# Patient Record
Sex: Male | Born: 1935 | Race: Black or African American | Hispanic: No | Smoking: Former smoker
Health system: Southern US, Community
[De-identification: ages and names within clinical notes are randomized; demographics above are authoritative.]

## PROBLEM LIST (undated history)

## (undated) DIAGNOSIS — J189 Pneumonia, unspecified organism: Secondary | ICD-10-CM

## (undated) DIAGNOSIS — M549 Dorsalgia, unspecified: Secondary | ICD-10-CM

## (undated) DIAGNOSIS — H409 Unspecified glaucoma: Secondary | ICD-10-CM

## (undated) DIAGNOSIS — F329 Major depressive disorder, single episode, unspecified: Secondary | ICD-10-CM

## (undated) DIAGNOSIS — K219 Gastro-esophageal reflux disease without esophagitis: Secondary | ICD-10-CM

## (undated) DIAGNOSIS — G8929 Other chronic pain: Secondary | ICD-10-CM

## (undated) DIAGNOSIS — I2510 Atherosclerotic heart disease of native coronary artery without angina pectoris: Secondary | ICD-10-CM

## (undated) DIAGNOSIS — E039 Hypothyroidism, unspecified: Secondary | ICD-10-CM

## (undated) DIAGNOSIS — K449 Diaphragmatic hernia without obstruction or gangrene: Secondary | ICD-10-CM

## (undated) DIAGNOSIS — N289 Disorder of kidney and ureter, unspecified: Secondary | ICD-10-CM

## (undated) DIAGNOSIS — T8859XA Other complications of anesthesia, initial encounter: Secondary | ICD-10-CM

## (undated) DIAGNOSIS — I2699 Other pulmonary embolism without acute cor pulmonale: Secondary | ICD-10-CM

## (undated) DIAGNOSIS — K222 Esophageal obstruction: Secondary | ICD-10-CM

## (undated) DIAGNOSIS — I480 Paroxysmal atrial fibrillation: Secondary | ICD-10-CM

## (undated) DIAGNOSIS — I219 Acute myocardial infarction, unspecified: Secondary | ICD-10-CM

## (undated) DIAGNOSIS — F32A Depression, unspecified: Secondary | ICD-10-CM

## (undated) DIAGNOSIS — R112 Nausea with vomiting, unspecified: Secondary | ICD-10-CM

## (undated) DIAGNOSIS — T4145XA Adverse effect of unspecified anesthetic, initial encounter: Secondary | ICD-10-CM

## (undated) DIAGNOSIS — H9190 Unspecified hearing loss, unspecified ear: Secondary | ICD-10-CM

## (undated) DIAGNOSIS — E785 Hyperlipidemia, unspecified: Secondary | ICD-10-CM

## (undated) DIAGNOSIS — I209 Angina pectoris, unspecified: Secondary | ICD-10-CM

## (undated) DIAGNOSIS — K649 Unspecified hemorrhoids: Secondary | ICD-10-CM

## (undated) DIAGNOSIS — I639 Cerebral infarction, unspecified: Secondary | ICD-10-CM

## (undated) DIAGNOSIS — F419 Anxiety disorder, unspecified: Secondary | ICD-10-CM

## (undated) DIAGNOSIS — M1990 Unspecified osteoarthritis, unspecified site: Secondary | ICD-10-CM

## (undated) DIAGNOSIS — J45909 Unspecified asthma, uncomplicated: Secondary | ICD-10-CM

## (undated) DIAGNOSIS — Z9889 Other specified postprocedural states: Secondary | ICD-10-CM

## (undated) DIAGNOSIS — I82409 Acute embolism and thrombosis of unspecified deep veins of unspecified lower extremity: Secondary | ICD-10-CM

## (undated) DIAGNOSIS — J9819 Other pulmonary collapse: Secondary | ICD-10-CM

## (undated) DIAGNOSIS — R109 Unspecified abdominal pain: Secondary | ICD-10-CM

## (undated) DIAGNOSIS — I10 Essential (primary) hypertension: Secondary | ICD-10-CM

## (undated) HISTORY — DX: Unspecified hemorrhoids: K64.9

## (undated) HISTORY — PX: HERNIA REPAIR: SHX51

## (undated) HISTORY — DX: Esophageal obstruction: K22.2

## (undated) HISTORY — DX: Diaphragmatic hernia without obstruction or gangrene: K44.9

## (undated) HISTORY — PX: CORONARY ANGIOPLASTY WITH STENT PLACEMENT: SHX49

## (undated) HISTORY — PX: CARDIAC CATHETERIZATION: SHX172

---
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
## 1968-09-22 DIAGNOSIS — J189 Pneumonia, unspecified organism: Secondary | ICD-10-CM

## 1968-09-22 HISTORY — DX: Pneumonia, unspecified organism: J18.9

## 1978-09-23 DIAGNOSIS — I82409 Acute embolism and thrombosis of unspecified deep veins of unspecified lower extremity: Secondary | ICD-10-CM

## 1978-09-23 HISTORY — DX: Acute embolism and thrombosis of unspecified deep veins of unspecified lower extremity: I82.409

## 1988-09-22 HISTORY — PX: CATARACT EXTRACTION W/ INTRAOCULAR LENS  IMPLANT, BILATERAL: SHX1307

## 1997-11-17 ENCOUNTER — Ambulatory Visit (HOSPITAL_COMMUNITY): Admission: RE | Admit: 1997-11-17 | Discharge: 1997-11-17 | Payer: Self-pay | Admitting: Family Medicine

## 1997-11-17 ENCOUNTER — Encounter: Payer: Self-pay | Admitting: Family Medicine

## 1997-11-19 ENCOUNTER — Observation Stay (HOSPITAL_COMMUNITY): Admission: AD | Admit: 1997-11-19 | Discharge: 1997-11-20 | Payer: Self-pay | Admitting: Cardiology

## 1998-01-28 ENCOUNTER — Encounter: Payer: Self-pay | Admitting: Cardiology

## 1998-01-28 ENCOUNTER — Ambulatory Visit (HOSPITAL_COMMUNITY): Admission: RE | Admit: 1998-01-28 | Discharge: 1998-01-28 | Payer: Self-pay | Admitting: Cardiology

## 1998-02-03 ENCOUNTER — Observation Stay (HOSPITAL_COMMUNITY): Admission: AD | Admit: 1998-02-03 | Discharge: 1998-02-04 | Payer: Self-pay | Admitting: Cardiology

## 1998-05-24 ENCOUNTER — Ambulatory Visit (HOSPITAL_COMMUNITY): Admission: RE | Admit: 1998-05-24 | Discharge: 1998-05-24 | Payer: Self-pay | Admitting: Gastroenterology

## 1999-08-29 ENCOUNTER — Encounter: Payer: Self-pay | Admitting: Urology

## 1999-08-29 ENCOUNTER — Encounter: Admission: RE | Admit: 1999-08-29 | Discharge: 1999-08-29 | Payer: Self-pay | Admitting: Urology

## 2000-02-20 ENCOUNTER — Ambulatory Visit (HOSPITAL_COMMUNITY): Admission: RE | Admit: 2000-02-20 | Discharge: 2000-02-20 | Payer: Self-pay | Admitting: Cardiology

## 2000-05-10 ENCOUNTER — Emergency Department (HOSPITAL_COMMUNITY): Admission: EM | Admit: 2000-05-10 | Discharge: 2000-05-10 | Payer: Self-pay | Admitting: *Deleted

## 2000-07-15 ENCOUNTER — Ambulatory Visit (HOSPITAL_COMMUNITY): Admission: RE | Admit: 2000-07-15 | Discharge: 2000-07-15 | Payer: Self-pay | Admitting: Cardiology

## 2000-07-15 ENCOUNTER — Encounter: Payer: Self-pay | Admitting: Cardiology

## 2000-12-24 ENCOUNTER — Inpatient Hospital Stay (HOSPITAL_COMMUNITY): Admission: EM | Admit: 2000-12-24 | Discharge: 2000-12-27 | Payer: Self-pay

## 2000-12-26 ENCOUNTER — Encounter: Payer: Self-pay | Admitting: Cardiology

## 2001-03-07 ENCOUNTER — Encounter: Payer: Self-pay | Admitting: Family Medicine

## 2001-03-07 ENCOUNTER — Ambulatory Visit (HOSPITAL_COMMUNITY): Admission: RE | Admit: 2001-03-07 | Discharge: 2001-03-07 | Payer: Self-pay | Admitting: Family Medicine

## 2001-12-13 ENCOUNTER — Emergency Department (HOSPITAL_COMMUNITY): Admission: EM | Admit: 2001-12-13 | Discharge: 2001-12-13 | Payer: Self-pay | Admitting: Emergency Medicine

## 2001-12-13 ENCOUNTER — Encounter: Payer: Self-pay | Admitting: Emergency Medicine

## 2002-06-15 ENCOUNTER — Ambulatory Visit (HOSPITAL_COMMUNITY): Admission: RE | Admit: 2002-06-15 | Discharge: 2002-06-15 | Payer: Self-pay | Admitting: Cardiology

## 2002-06-15 ENCOUNTER — Encounter: Payer: Self-pay | Admitting: Cardiology

## 2002-12-24 ENCOUNTER — Ambulatory Visit (HOSPITAL_COMMUNITY): Admission: RE | Admit: 2002-12-24 | Discharge: 2002-12-25 | Payer: Self-pay | Admitting: Cardiology

## 2003-06-28 ENCOUNTER — Ambulatory Visit (HOSPITAL_COMMUNITY): Admission: RE | Admit: 2003-06-28 | Discharge: 2003-06-28 | Payer: Self-pay | Admitting: Cardiology

## 2003-09-04 ENCOUNTER — Emergency Department (HOSPITAL_COMMUNITY): Admission: EM | Admit: 2003-09-04 | Discharge: 2003-09-05 | Payer: Self-pay | Admitting: Emergency Medicine

## 2003-10-25 ENCOUNTER — Ambulatory Visit: Payer: Self-pay | Admitting: Family Medicine

## 2003-10-29 ENCOUNTER — Ambulatory Visit: Payer: Self-pay | Admitting: Sports Medicine

## 2003-11-11 ENCOUNTER — Ambulatory Visit: Payer: Self-pay | Admitting: Family Medicine

## 2003-11-26 ENCOUNTER — Ambulatory Visit: Payer: Self-pay | Admitting: Family Medicine

## 2004-05-01 ENCOUNTER — Emergency Department (HOSPITAL_COMMUNITY): Admission: EM | Admit: 2004-05-01 | Discharge: 2004-05-01 | Payer: Self-pay | Admitting: Emergency Medicine

## 2004-05-31 ENCOUNTER — Encounter: Admission: RE | Admit: 2004-05-31 | Discharge: 2004-05-31 | Payer: Self-pay | Admitting: Allergy and Immunology

## 2004-11-17 ENCOUNTER — Inpatient Hospital Stay (HOSPITAL_COMMUNITY): Admission: EM | Admit: 2004-11-17 | Discharge: 2004-11-21 | Payer: Self-pay | Admitting: Emergency Medicine

## 2004-12-02 ENCOUNTER — Emergency Department (HOSPITAL_COMMUNITY): Admission: EM | Admit: 2004-12-02 | Discharge: 2004-12-02 | Payer: Self-pay | Admitting: Emergency Medicine

## 2005-05-21 ENCOUNTER — Ambulatory Visit (HOSPITAL_COMMUNITY): Admission: RE | Admit: 2005-05-21 | Discharge: 2005-05-21 | Payer: Self-pay | Admitting: Cardiology

## 2005-08-12 ENCOUNTER — Emergency Department (HOSPITAL_COMMUNITY): Admission: EM | Admit: 2005-08-12 | Discharge: 2005-08-12 | Payer: Self-pay | Admitting: Emergency Medicine

## 2005-11-08 ENCOUNTER — Emergency Department (HOSPITAL_COMMUNITY): Admission: EM | Admit: 2005-11-08 | Discharge: 2005-11-08 | Payer: Self-pay | Admitting: Emergency Medicine

## 2005-12-19 ENCOUNTER — Emergency Department (HOSPITAL_COMMUNITY): Admission: EM | Admit: 2005-12-19 | Discharge: 2005-12-19 | Payer: Self-pay | Admitting: Emergency Medicine

## 2006-03-21 DIAGNOSIS — E1165 Type 2 diabetes mellitus with hyperglycemia: Secondary | ICD-10-CM

## 2006-03-21 DIAGNOSIS — E1129 Type 2 diabetes mellitus with other diabetic kidney complication: Secondary | ICD-10-CM | POA: Insufficient documentation

## 2006-03-21 DIAGNOSIS — J309 Allergic rhinitis, unspecified: Secondary | ICD-10-CM | POA: Insufficient documentation

## 2006-03-21 DIAGNOSIS — I10 Essential (primary) hypertension: Secondary | ICD-10-CM

## 2006-03-21 DIAGNOSIS — I739 Peripheral vascular disease, unspecified: Secondary | ICD-10-CM | POA: Insufficient documentation

## 2006-07-08 ENCOUNTER — Ambulatory Visit (HOSPITAL_COMMUNITY): Admission: RE | Admit: 2006-07-08 | Discharge: 2006-07-08 | Payer: Self-pay | Admitting: Cardiology

## 2007-07-18 ENCOUNTER — Emergency Department (HOSPITAL_COMMUNITY): Admission: EM | Admit: 2007-07-18 | Discharge: 2007-07-18 | Payer: Self-pay | Admitting: Emergency Medicine

## 2008-01-23 DIAGNOSIS — I2699 Other pulmonary embolism without acute cor pulmonale: Secondary | ICD-10-CM

## 2008-01-23 HISTORY — DX: Other pulmonary embolism without acute cor pulmonale: I26.99

## 2008-02-29 ENCOUNTER — Inpatient Hospital Stay (HOSPITAL_COMMUNITY): Admission: EM | Admit: 2008-02-29 | Discharge: 2008-03-08 | Payer: Self-pay | Admitting: Emergency Medicine

## 2008-03-01 ENCOUNTER — Encounter (INDEPENDENT_AMBULATORY_CARE_PROVIDER_SITE_OTHER): Payer: Self-pay | Admitting: Cardiovascular Disease

## 2008-03-16 ENCOUNTER — Inpatient Hospital Stay (HOSPITAL_COMMUNITY): Admission: EM | Admit: 2008-03-16 | Discharge: 2008-03-23 | Payer: Self-pay | Admitting: Emergency Medicine

## 2008-03-16 ENCOUNTER — Encounter (INDEPENDENT_AMBULATORY_CARE_PROVIDER_SITE_OTHER): Payer: Self-pay | Admitting: Emergency Medicine

## 2008-03-17 ENCOUNTER — Ambulatory Visit: Payer: Self-pay | Admitting: Vascular Surgery

## 2008-04-01 ENCOUNTER — Encounter (HOSPITAL_COMMUNITY): Admission: RE | Admit: 2008-04-01 | Discharge: 2008-06-30 | Payer: Self-pay | Admitting: Cardiology

## 2008-08-28 ENCOUNTER — Emergency Department (HOSPITAL_COMMUNITY): Admission: EM | Admit: 2008-08-28 | Discharge: 2008-08-29 | Payer: Self-pay | Admitting: Emergency Medicine

## 2008-09-25 ENCOUNTER — Emergency Department (HOSPITAL_COMMUNITY): Admission: EM | Admit: 2008-09-25 | Discharge: 2008-09-25 | Payer: Self-pay | Admitting: Family Medicine

## 2008-10-15 ENCOUNTER — Observation Stay (HOSPITAL_COMMUNITY): Admission: EM | Admit: 2008-10-15 | Discharge: 2008-10-17 | Payer: Self-pay | Admitting: Emergency Medicine

## 2009-02-11 ENCOUNTER — Emergency Department (HOSPITAL_COMMUNITY): Admission: EM | Admit: 2009-02-11 | Discharge: 2009-02-11 | Payer: Self-pay | Admitting: Emergency Medicine

## 2009-06-27 ENCOUNTER — Inpatient Hospital Stay (HOSPITAL_COMMUNITY): Admission: EM | Admit: 2009-06-27 | Discharge: 2009-07-11 | Payer: Self-pay | Admitting: Emergency Medicine

## 2009-07-01 ENCOUNTER — Encounter (INDEPENDENT_AMBULATORY_CARE_PROVIDER_SITE_OTHER): Payer: Self-pay | Admitting: Cardiology

## 2009-07-12 LAB — HM COLONOSCOPY

## 2010-04-09 LAB — BASIC METABOLIC PANEL
BUN: 15 mg/dL (ref 6–23)
CO2: 26 mEq/L (ref 19–32)
CO2: 26 mEq/L (ref 19–32)
CO2: 26 mEq/L (ref 19–32)
Chloride: 107 mEq/L (ref 96–112)
GFR calc Af Amer: 50 mL/min — ABNORMAL LOW (ref >60)
GFR calc non Af Amer: 41 mL/min — ABNORMAL LOW (ref >60)
Glucose, Bld: 103 mg/dL — ABNORMAL HIGH (ref 70–99)
Glucose, Bld: 95 mg/dL (ref 70–99)
Potassium: 3.7 mEq/L (ref 3.5–5.1)
Potassium: 3.9 mEq/L (ref 3.5–5.1)
Potassium: 4 mEq/L (ref 3.5–5.1)
Sodium: 138 mEq/L (ref 135–145)
Sodium: 140 mEq/L (ref 135–145)

## 2010-04-09 LAB — GLUCOSE, CAPILLARY
Glucose-Capillary: 106 mg/dL — ABNORMAL HIGH (ref 70–99)
Glucose-Capillary: 108 mg/dL — ABNORMAL HIGH (ref 70–99)
Glucose-Capillary: 110 mg/dL — ABNORMAL HIGH (ref 70–99)
Glucose-Capillary: 110 mg/dL — ABNORMAL HIGH (ref 70–99)
Glucose-Capillary: 113 mg/dL — ABNORMAL HIGH (ref 70–99)
Glucose-Capillary: 117 mg/dL — ABNORMAL HIGH (ref 70–99)
Glucose-Capillary: 121 mg/dL — ABNORMAL HIGH (ref 70–99)
Glucose-Capillary: 128 mg/dL — ABNORMAL HIGH (ref 70–99)
Glucose-Capillary: 135 mg/dL — ABNORMAL HIGH (ref 70–99)
Glucose-Capillary: 140 mg/dL — ABNORMAL HIGH (ref 70–99)
Glucose-Capillary: 147 mg/dL — ABNORMAL HIGH (ref 70–99)
Glucose-Capillary: 154 mg/dL — ABNORMAL HIGH (ref 70–99)
Glucose-Capillary: 92 mg/dL (ref 70–99)

## 2010-04-09 LAB — CBC
HCT: 28.9 % — ABNORMAL LOW (ref 39.0–52.0)
HCT: 30.4 % — ABNORMAL LOW (ref 39.0–52.0)
HCT: 31.1 % — ABNORMAL LOW (ref 39.0–52.0)
HCT: 32.1 % — ABNORMAL LOW (ref 39.0–52.0)
Hemoglobin: 10.2 g/dL — ABNORMAL LOW (ref 13.0–17.0)
Hemoglobin: 10.3 g/dL — ABNORMAL LOW (ref 13.0–17.0)
Hemoglobin: 9.5 g/dL — ABNORMAL LOW (ref 13.0–17.0)
MCHC: 32.6 g/dL (ref 30.0–36.0)
MCHC: 32.8 g/dL (ref 30.0–36.0)
MCHC: 33.1 g/dL (ref 30.0–36.0)
MCHC: 33.4 g/dL (ref 30.0–36.0)
MCHC: 33.8 g/dL (ref 30.0–36.0)
MCV: 87.8 fL (ref 78.0–100.0)
MCV: 88.1 fL (ref 78.0–100.0)
MCV: 88.2 fL (ref 78.0–100.0)
Platelets: 118 10*3/uL — ABNORMAL LOW (ref 150–400)
Platelets: 114 10*3/uL — ABNORMAL LOW (ref 150–400)
Platelets: 129 10*3/uL — ABNORMAL LOW (ref 150–400)
RBC: 3.44 MIL/uL — ABNORMAL LOW (ref 4.22–5.81)
RBC: 3.45 MIL/uL — ABNORMAL LOW (ref 4.22–5.81)
RDW: 16.7 % — ABNORMAL HIGH (ref 11.5–15.5)
RDW: 16.4 % — ABNORMAL HIGH (ref 11.5–15.5)
RDW: 16.6 % — ABNORMAL HIGH (ref 11.5–15.5)
RDW: 16.7 % — ABNORMAL HIGH (ref 11.5–15.5)
WBC: 8.9 10*3/uL (ref 4.0–10.5)
WBC: 7.7 10*3/uL (ref 4.0–10.5)

## 2010-04-09 LAB — POCT CARDIAC MARKERS
CKMB, poc: 1.1 ng/mL (ref 1.0–8.0)
Myoglobin, poc: 131 ng/mL (ref 12–200)
Troponin i, poc: <0.05 ng/mL (ref 0.00–0.09)
Troponin i, poc: <0.05 ng/mL (ref 0.00–0.09)

## 2010-04-09 LAB — APTT: aPTT: 33 seconds (ref 24–37)

## 2010-04-09 LAB — POCT I-STAT, CHEM 8
Calcium, Ion: 1.19 mmol/L (ref 1.12–1.32)
Glucose, Bld: 118 mg/dL — ABNORMAL HIGH (ref 70–99)
HCT: 36 % — ABNORMAL LOW (ref 39.0–52.0)
Hemoglobin: 12.2 g/dL — ABNORMAL LOW (ref 13.0–17.0)

## 2010-04-09 LAB — PROTIME-INR
INR: 2.03 — ABNORMAL HIGH (ref 0.00–1.49)
INR: 1.49 (ref 0.00–1.49)
INR: 1.89 — ABNORMAL HIGH (ref 0.00–1.49)
INR: 2.3 — ABNORMAL HIGH (ref 0.00–1.49)
Prothrombin Time: 22.8 seconds — ABNORMAL HIGH (ref 11.6–15.2)
Prothrombin Time: 17.9 seconds — ABNORMAL HIGH (ref 11.6–15.2)
Prothrombin Time: 21.5 seconds — ABNORMAL HIGH (ref 11.6–15.2)
Prothrombin Time: 23.7 seconds — ABNORMAL HIGH (ref 11.6–15.2)
Prothrombin Time: 25.1 seconds — ABNORMAL HIGH (ref 11.6–15.2)

## 2010-04-09 LAB — DIFFERENTIAL
Basophils Absolute: 0 10*3/uL (ref 0.0–0.1)
Lymphocytes Relative: 25 % (ref 12–46)
Neutro Abs: 5.4 10*3/uL (ref 1.7–7.7)
Neutrophils Relative %: 61 % (ref 43–77)

## 2010-04-09 LAB — MAGNESIUM: Magnesium: 1.7 mg/dL (ref 1.5–2.5)

## 2010-04-09 LAB — HEPARIN LEVEL (UNFRACTIONATED): Heparin Unfractionated: 0.39 IU/mL (ref 0.30–0.70)

## 2010-04-10 LAB — PROTIME-INR
INR: 1.19 (ref 0.00–1.49)
INR: 1.26 (ref 0.00–1.49)
INR: 1.45 (ref 0.00–1.49)
INR: 2.3 — ABNORMAL HIGH (ref 0.00–1.49)
INR: 2.43 — ABNORMAL HIGH (ref 0.00–1.49)
Prothrombin Time: 15 seconds (ref 11.6–15.2)
Prothrombin Time: 15.1 seconds (ref 11.6–15.2)
Prothrombin Time: 15.7 seconds — ABNORMAL HIGH (ref 11.6–15.2)

## 2010-04-10 LAB — BASIC METABOLIC PANEL
BUN: 15 mg/dL (ref 6–23)
BUN: 28 mg/dL — ABNORMAL HIGH (ref 6–23)
BUN: 59 mg/dL — ABNORMAL HIGH (ref 6–23)
CO2: 23 mEq/L (ref 19–32)
CO2: 24 mEq/L (ref 19–32)
Calcium: 8.8 mg/dL (ref 8.4–10.5)
Calcium: 9.1 mg/dL (ref 8.4–10.5)
Chloride: 109 mEq/L (ref 96–112)
Chloride: 109 mEq/L (ref 96–112)
Chloride: 113 mEq/L — ABNORMAL HIGH (ref 96–112)
Chloride: 113 mEq/L — ABNORMAL HIGH (ref 96–112)
Creatinine, Ser: 1.47 mg/dL (ref 0.4–1.5)
Creatinine, Ser: 1.75 mg/dL — ABNORMAL HIGH (ref 0.4–1.5)
GFR calc Af Amer: 47 mL/min — ABNORMAL LOW (ref >60)
GFR calc non Af Amer: 23 mL/min — ABNORMAL LOW (ref >60)
GFR calc non Af Amer: 43 mL/min — ABNORMAL LOW (ref >60)
GFR calc non Af Amer: 46 mL/min — ABNORMAL LOW (ref >60)
Glucose, Bld: 94 mg/dL (ref 70–99)
Glucose, Bld: 95 mg/dL (ref 70–99)
Glucose, Bld: 97 mg/dL (ref 70–99)
Glucose, Bld: 98 mg/dL (ref 70–99)
Potassium: 3.8 mEq/L (ref 3.5–5.1)
Potassium: 4 mEq/L (ref 3.5–5.1)
Potassium: 4.7 mEq/L (ref 3.5–5.1)
Sodium: 138 mEq/L (ref 135–145)
Sodium: 138 mEq/L (ref 135–145)
Sodium: 140 mEq/L (ref 135–145)

## 2010-04-10 LAB — GLUCOSE, CAPILLARY
Glucose-Capillary: 101 mg/dL — ABNORMAL HIGH (ref 70–99)
Glucose-Capillary: 109 mg/dL — ABNORMAL HIGH (ref 70–99)
Glucose-Capillary: 116 mg/dL — ABNORMAL HIGH (ref 70–99)
Glucose-Capillary: 122 mg/dL — ABNORMAL HIGH (ref 70–99)
Glucose-Capillary: 127 mg/dL — ABNORMAL HIGH (ref 70–99)
Glucose-Capillary: 128 mg/dL — ABNORMAL HIGH (ref 70–99)
Glucose-Capillary: 131 mg/dL — ABNORMAL HIGH (ref 70–99)
Glucose-Capillary: 131 mg/dL — ABNORMAL HIGH (ref 70–99)
Glucose-Capillary: 134 mg/dL — ABNORMAL HIGH (ref 70–99)
Glucose-Capillary: 142 mg/dL — ABNORMAL HIGH (ref 70–99)
Glucose-Capillary: 157 mg/dL — ABNORMAL HIGH (ref 70–99)
Glucose-Capillary: 82 mg/dL (ref 70–99)
Glucose-Capillary: 93 mg/dL (ref 70–99)
Glucose-Capillary: 125 mg/dL — ABNORMAL HIGH (ref 70–99)
Glucose-Capillary: 135 mg/dL — ABNORMAL HIGH (ref 70–99)

## 2010-04-10 LAB — LIPID PANEL
LDL Cholesterol: 54 mg/dL (ref 0–99)
Total CHOL/HDL Ratio: 2.9 RATIO
Triglycerides: 104 mg/dL (ref <150)
VLDL: 21 mg/dL (ref 0–40)

## 2010-04-10 LAB — CLOSTRIDIUM DIFFICILE EIA: C difficile Toxins A+B, EIA: NEGATIVE

## 2010-04-10 LAB — LIPASE, BLOOD
Lipase: 40 U/L (ref 11–59)
Lipase: 53 U/L (ref 11–59)
Lipase: 63 U/L — ABNORMAL HIGH (ref 11–59)

## 2010-04-10 LAB — URINALYSIS, ROUTINE W REFLEX MICROSCOPIC
Ketones, ur: NEGATIVE mg/dL
Nitrite: NEGATIVE
Protein, ur: NEGATIVE mg/dL
Urobilinogen, UA: 0.2 mg/dL (ref 0.0–1.0)

## 2010-04-10 LAB — CBC
HCT: 33.5 % — ABNORMAL LOW (ref 39.0–52.0)
Hemoglobin: 10 g/dL — ABNORMAL LOW (ref 13.0–17.0)
Hemoglobin: 10.9 g/dL — ABNORMAL LOW (ref 13.0–17.0)
Hemoglobin: 11.5 g/dL — ABNORMAL LOW (ref 13.0–17.0)
Hemoglobin: 11.6 g/dL — ABNORMAL LOW (ref 13.0–17.0)
MCHC: 32.5 g/dL (ref 30.0–36.0)
MCHC: 32.6 g/dL (ref 30.0–36.0)
MCHC: 32.7 g/dL (ref 30.0–36.0)
MCHC: 32.8 g/dL (ref 30.0–36.0)
MCHC: 33 g/dL (ref 30.0–36.0)
MCV: 87.8 fL (ref 78.0–100.0)
Platelets: 116 10*3/uL — ABNORMAL LOW (ref 150–400)
Platelets: 117 10*3/uL — ABNORMAL LOW (ref 150–400)
Platelets: 128 10*3/uL — ABNORMAL LOW (ref 150–400)
RBC: 4.05 MIL/uL — ABNORMAL LOW (ref 4.22–5.81)
RDW: 16 % — ABNORMAL HIGH (ref 11.5–15.5)
RDW: 16.1 % — ABNORMAL HIGH (ref 11.5–15.5)
RDW: 16.2 % — ABNORMAL HIGH (ref 11.5–15.5)
RDW: 16.3 % — ABNORMAL HIGH (ref 11.5–15.5)
WBC: 7.6 10*3/uL (ref 4.0–10.5)
WBC: 7.8 10*3/uL (ref 4.0–10.5)
WBC: 9.7 10*3/uL (ref 4.0–10.5)

## 2010-04-10 LAB — POCT CARDIAC MARKERS
CKMB, poc: 1.7 ng/mL (ref 1.0–8.0)
Troponin i, poc: <0.05 ng/mL (ref 0.00–0.09)

## 2010-04-10 LAB — DIFFERENTIAL
Eosinophils Absolute: 0.2 10*3/uL (ref 0.0–0.7)
Lymphs Abs: 2 10*3/uL (ref 0.7–4.0)
Monocytes Absolute: 0.8 10*3/uL (ref 0.1–1.0)
Monocytes Relative: 11 % (ref 3–12)
Neutro Abs: 4.2 10*3/uL (ref 1.7–7.7)
Neutrophils Relative %: 59 % (ref 43–77)

## 2010-04-10 LAB — AMYLASE
Amylase: 242 U/L — ABNORMAL HIGH (ref 0–105)
Amylase: 314 U/L — ABNORMAL HIGH (ref 0–105)
Amylase: 329 U/L — ABNORMAL HIGH (ref 0–105)
Amylase: 354 U/L — ABNORMAL HIGH (ref 0–105)

## 2010-04-10 LAB — TROPONIN I: Troponin I: 0.01 ng/mL (ref 0.00–0.06)

## 2010-04-10 LAB — COMPREHENSIVE METABOLIC PANEL
ALT: 21 U/L (ref 0–53)
Albumin: 4 g/dL (ref 3.5–5.2)
Calcium: 9.4 mg/dL (ref 8.4–10.5)
Glucose, Bld: 98 mg/dL (ref 70–99)
Potassium: 5.7 mEq/L — ABNORMAL HIGH (ref 3.5–5.1)
Sodium: 143 mEq/L (ref 135–145)
Total Protein: 7.5 g/dL (ref 6.0–8.3)

## 2010-04-10 LAB — CARDIAC PANEL(CRET KIN+CKTOT+MB+TROPI)
CK, MB: 1.8 ng/mL (ref 0.3–4.0)
Relative Index: 1.3 (ref 0.0–2.5)
Total CK: 142 U/L (ref 7–232)
Troponin I: 0.01 ng/mL (ref 0.00–0.06)

## 2010-04-10 LAB — GIARDIA/CRYPTOSPORIDIUM SCREEN(EIA): Cryptosporidium Screen (EIA): NEGATIVE

## 2010-04-10 LAB — HEPARIN LEVEL (UNFRACTIONATED)
Heparin Unfractionated: 0.82 IU/mL — ABNORMAL HIGH (ref 0.30–0.70)
Heparin Unfractionated: 1.19 IU/mL — ABNORMAL HIGH (ref 0.30–0.70)
Heparin Unfractionated: 1.52 IU/mL — ABNORMAL HIGH (ref 0.30–0.70)

## 2010-04-10 LAB — APTT: aPTT: 33 seconds (ref 24–37)

## 2010-04-10 LAB — HEMOCCULT GUIAC POC 1CARD (OFFICE): Fecal Occult Bld: NEGATIVE

## 2010-04-10 LAB — CK TOTAL AND CKMB (NOT AT ARMC): Total CK: 140 U/L (ref 7–232)

## 2010-04-28 LAB — URINALYSIS, ROUTINE W REFLEX MICROSCOPIC
Bilirubin Urine: NEGATIVE
Hgb urine dipstick: NEGATIVE
Nitrite: NEGATIVE
Specific Gravity, Urine: 1.008 (ref 1.005–1.030)
Urobilinogen, UA: 0.2 mg/dL (ref 0.0–1.0)
pH: 5 (ref 5.0–8.0)

## 2010-04-28 LAB — POCT CARDIAC MARKERS
CKMB, poc: <1 ng/mL — ABNORMAL LOW (ref 1.0–8.0)
Troponin i, poc: <0.05 ng/mL (ref 0.00–0.09)
Troponin i, poc: <0.05 ng/mL (ref 0.00–0.09)

## 2010-04-28 LAB — URINE CULTURE: Colony Count: NO GROWTH

## 2010-04-28 LAB — LIPID PANEL
LDL Cholesterol: 72 mg/dL (ref 0–99)
Total CHOL/HDL Ratio: 2.4 RATIO
VLDL: 9 mg/dL (ref 0–40)

## 2010-04-28 LAB — CBC
Hemoglobin: 14.4 g/dL (ref 13.0–17.0)
RBC: 5.03 MIL/uL (ref 4.22–5.81)

## 2010-04-28 LAB — BASIC METABOLIC PANEL
Calcium: 8.9 mg/dL (ref 8.4–10.5)
Creatinine, Ser: 1.45 mg/dL (ref 0.4–1.5)
GFR calc Af Amer: 57 mL/min — ABNORMAL LOW (ref >60)
GFR calc non Af Amer: 47 mL/min — ABNORMAL LOW (ref >60)
Sodium: 140 mEq/L (ref 135–145)

## 2010-04-28 LAB — DIFFERENTIAL
Basophils Absolute: 0 10*3/uL (ref 0.0–0.1)
Lymphocytes Relative: 36 % (ref 12–46)
Monocytes Absolute: 0.6 10*3/uL (ref 0.1–1.0)
Monocytes Relative: 10 % (ref 3–12)
Neutro Abs: 2.9 10*3/uL (ref 1.7–7.7)
Neutrophils Relative %: 53 % (ref 43–77)

## 2010-04-28 LAB — CARDIAC PANEL(CRET KIN+CKTOT+MB+TROPI)
CK, MB: 2 ng/mL (ref 0.3–4.0)
CK, MB: 2.1 ng/mL (ref 0.3–4.0)
Relative Index: 1.4 (ref 0.0–2.5)
Troponin I: 0.08 ng/mL — ABNORMAL HIGH (ref 0.00–0.06)

## 2010-04-28 LAB — PROTIME-INR
INR: 2.8 — ABNORMAL HIGH (ref 0.00–1.49)
INR: 2.8 — ABNORMAL HIGH (ref 0.00–1.49)

## 2010-04-28 LAB — GLUCOSE, CAPILLARY

## 2010-04-28 LAB — CK TOTAL AND CKMB (NOT AT ARMC): Total CK: 155 U/L (ref 7–232)

## 2010-04-28 LAB — HEMOGLOBIN A1C: Mean Plasma Glucose: 140 mg/dL

## 2010-04-29 LAB — DIFFERENTIAL
Basophils Absolute: 0.1 10*3/uL (ref 0.0–0.1)
Basophils Relative: 1 % (ref 0–1)
Eosinophils Absolute: 0.1 10*3/uL (ref 0.0–0.7)
Eosinophils Relative: 2 % (ref 0–5)
Monocytes Absolute: 0.7 10*3/uL (ref 0.1–1.0)

## 2010-04-29 LAB — COMPREHENSIVE METABOLIC PANEL
ALT: 26 U/L (ref 0–53)
AST: 26 U/L (ref 0–37)
Albumin: 3.5 g/dL (ref 3.5–5.2)
Alkaline Phosphatase: 65 U/L (ref 39–117)
BUN: 14 mg/dL (ref 6–23)
Chloride: 109 mEq/L (ref 96–112)
GFR calc Af Amer: >60 mL/min (ref >60)
Potassium: 3.8 mEq/L (ref 3.5–5.1)
Total Bilirubin: 0.6 mg/dL (ref 0.3–1.2)

## 2010-04-29 LAB — POCT CARDIAC MARKERS
CKMB, poc: <1 ng/mL — ABNORMAL LOW (ref 1.0–8.0)
Myoglobin, poc: 74.9 ng/mL (ref 12–200)

## 2010-04-29 LAB — URINALYSIS, ROUTINE W REFLEX MICROSCOPIC
Bilirubin Urine: NEGATIVE
Glucose, UA: NEGATIVE mg/dL
Ketones, ur: NEGATIVE mg/dL
pH: 5.5 (ref 5.0–8.0)

## 2010-04-29 LAB — CBC
HCT: 41.3 % (ref 39.0–52.0)
Platelets: 121 10*3/uL — ABNORMAL LOW (ref 150–400)
WBC: 5.7 10*3/uL (ref 4.0–10.5)

## 2010-05-02 LAB — GLUCOSE, CAPILLARY
Glucose-Capillary: 120 mg/dL — ABNORMAL HIGH (ref 70–99)
Glucose-Capillary: 131 mg/dL — ABNORMAL HIGH (ref 70–99)
Glucose-Capillary: 92 mg/dL (ref 70–99)

## 2010-05-03 LAB — GLUCOSE, CAPILLARY
Glucose-Capillary: 119 mg/dL — ABNORMAL HIGH (ref 70–99)
Glucose-Capillary: 153 mg/dL — ABNORMAL HIGH (ref 70–99)
Glucose-Capillary: 100 mg/dL — ABNORMAL HIGH (ref 70–99)
Glucose-Capillary: 97 mg/dL (ref 70–99)

## 2010-05-04 LAB — GLUCOSE, CAPILLARY
Glucose-Capillary: 102 mg/dL — ABNORMAL HIGH (ref 70–99)
Glucose-Capillary: 110 mg/dL — ABNORMAL HIGH (ref 70–99)
Glucose-Capillary: 165 mg/dL — ABNORMAL HIGH (ref 70–99)
Glucose-Capillary: 174 mg/dL — ABNORMAL HIGH (ref 70–99)
Glucose-Capillary: 182 mg/dL — ABNORMAL HIGH (ref 70–99)
Glucose-Capillary: 94 mg/dL (ref 70–99)
Glucose-Capillary: 111 mg/dL — ABNORMAL HIGH (ref 70–99)
Glucose-Capillary: 122 mg/dL — ABNORMAL HIGH (ref 70–99)
Glucose-Capillary: 125 mg/dL — ABNORMAL HIGH (ref 70–99)
Glucose-Capillary: 132 mg/dL — ABNORMAL HIGH (ref 70–99)

## 2010-05-04 LAB — CBC
HCT: 40.2 % (ref 39.0–52.0)
Hemoglobin: 13.5 g/dL (ref 13.0–17.0)
MCV: 84.1 fL (ref 78.0–100.0)
Platelets: 186 10*3/uL (ref 150–400)
RBC: 4.75 MIL/uL (ref 4.22–5.81)
RDW: 15.4 % (ref 11.5–15.5)
WBC: 7.2 10*3/uL (ref 4.0–10.5)

## 2010-05-04 LAB — COMPREHENSIVE METABOLIC PANEL
ALT: 71 U/L — ABNORMAL HIGH (ref 0–53)
Alkaline Phosphatase: 71 U/L (ref 39–117)
BUN: 19 mg/dL (ref 6–23)
CO2: 30 mEq/L (ref 19–32)
Chloride: 102 mEq/L (ref 96–112)
GFR calc non Af Amer: 54 mL/min — ABNORMAL LOW (ref >60)
Glucose, Bld: 99 mg/dL (ref 70–99)
Potassium: 3.9 mEq/L (ref 3.5–5.1)
Total Bilirubin: 0.4 mg/dL (ref 0.3–1.2)
Total Protein: 7.2 g/dL (ref 6.0–8.3)

## 2010-05-04 LAB — HEPARIN LEVEL (UNFRACTIONATED)
Heparin Unfractionated: 0.34 IU/mL (ref 0.30–0.70)
Heparin Unfractionated: 0.58 IU/mL (ref 0.30–0.70)

## 2010-05-04 LAB — PROTIME-INR: INR: 2.5 — ABNORMAL HIGH (ref 0.00–1.49)

## 2010-05-09 LAB — BASIC METABOLIC PANEL
BUN: 11 mg/dL (ref 6–23)
BUN: 17 mg/dL (ref 6–23)
BUN: 17 mg/dL (ref 6–23)
BUN: 18 mg/dL (ref 6–23)
BUN: 28 mg/dL — ABNORMAL HIGH (ref 6–23)
CO2: 26 mEq/L (ref 19–32)
CO2: 28 mEq/L (ref 19–32)
CO2: 28 mEq/L (ref 19–32)
CO2: 28 mEq/L (ref 19–32)
CO2: 25 mEq/L (ref 19–32)
Calcium: 8.5 mg/dL (ref 8.4–10.5)
Calcium: 9.2 mg/dL (ref 8.4–10.5)
Chloride: 103 mEq/L (ref 96–112)
Chloride: 107 mEq/L (ref 96–112)
Chloride: 108 mEq/L (ref 96–112)
Chloride: 103 mEq/L (ref 96–112)
Creatinine, Ser: 1.25 mg/dL (ref 0.4–1.5)
Creatinine, Ser: 1.29 mg/dL (ref 0.4–1.5)
Creatinine, Ser: 1.38 mg/dL (ref 0.4–1.5)
Creatinine, Ser: 1.46 mg/dL (ref 0.4–1.5)
Creatinine, Ser: 1.32 mg/dL (ref 0.4–1.5)
GFR calc Af Amer: 57 mL/min — ABNORMAL LOW (ref >60)
GFR calc Af Amer: >60 mL/min (ref >60)
GFR calc Af Amer: >60 mL/min (ref >60)
GFR calc Af Amer: >60 mL/min (ref >60)
GFR calc Af Amer: >60 mL/min (ref >60)
GFR calc non Af Amer: 51 mL/min — ABNORMAL LOW (ref >60)
GFR calc non Af Amer: 55 mL/min — ABNORMAL LOW (ref >60)
GFR calc non Af Amer: 56 mL/min — ABNORMAL LOW (ref >60)
GFR calc non Af Amer: 53 mL/min — ABNORMAL LOW (ref >60)
Glucose, Bld: 85 mg/dL (ref 70–99)
Glucose, Bld: 88 mg/dL (ref 70–99)
Glucose, Bld: 90 mg/dL (ref 70–99)
Glucose, Bld: 113 mg/dL — ABNORMAL HIGH (ref 70–99)
Glucose, Bld: 151 mg/dL — ABNORMAL HIGH (ref 70–99)
Potassium: 3.8 mEq/L (ref 3.5–5.1)
Potassium: 4.2 mEq/L (ref 3.5–5.1)
Potassium: 4.3 mEq/L (ref 3.5–5.1)
Potassium: 3.8 mEq/L (ref 3.5–5.1)
Potassium: 4 mEq/L (ref 3.5–5.1)
Sodium: 138 mEq/L (ref 135–145)
Sodium: 140 mEq/L (ref 135–145)
Sodium: 136 mEq/L (ref 135–145)

## 2010-05-09 LAB — CBC
HCT: 35.5 % — ABNORMAL LOW (ref 39.0–52.0)
HCT: 36.6 % — ABNORMAL LOW (ref 39.0–52.0)
HCT: 37.8 % — ABNORMAL LOW (ref 39.0–52.0)
HCT: 38.6 % — ABNORMAL LOW (ref 39.0–52.0)
HCT: 39.9 % (ref 39.0–52.0)
HCT: 38.2 % — ABNORMAL LOW (ref 39.0–52.0)
HCT: 39.6 % (ref 39.0–52.0)
HCT: 40 % (ref 39.0–52.0)
HCT: 40.6 % (ref 39.0–52.0)
Hemoglobin: 11.9 g/dL — ABNORMAL LOW (ref 13.0–17.0)
Hemoglobin: 12.4 g/dL — ABNORMAL LOW (ref 13.0–17.0)
Hemoglobin: 13 g/dL (ref 13.0–17.0)
Hemoglobin: 13.1 g/dL (ref 13.0–17.0)
Hemoglobin: 13.2 g/dL (ref 13.0–17.0)
Hemoglobin: 13.3 g/dL (ref 13.0–17.0)
Hemoglobin: 13.4 g/dL (ref 13.0–17.0)
MCHC: 32.6 g/dL (ref 30.0–36.0)
MCHC: 33.1 g/dL (ref 30.0–36.0)
MCHC: 34 g/dL (ref 30.0–36.0)
MCHC: 32.8 g/dL (ref 30.0–36.0)
MCHC: 33.2 g/dL (ref 30.0–36.0)
MCHC: 33.6 g/dL (ref 30.0–36.0)
MCV: 85.3 fL (ref 78.0–100.0)
MCV: 85.5 fL (ref 78.0–100.0)
MCV: 86.2 fL (ref 78.0–100.0)
MCV: 86.6 fL (ref 78.0–100.0)
MCV: 84.6 fL (ref 78.0–100.0)
MCV: 85.3 fL (ref 78.0–100.0)
MCV: 85.5 fL (ref 78.0–100.0)
MCV: 86.6 fL (ref 78.0–100.0)
Platelets: 139 10*3/uL — ABNORMAL LOW (ref 150–400)
Platelets: 170 10*3/uL (ref 150–400)
Platelets: 170 10*3/uL (ref 150–400)
Platelets: 169 10*3/uL (ref 150–400)
Platelets: 180 10*3/uL (ref 150–400)
Platelets: 188 10*3/uL (ref 150–400)
Platelets: 191 10*3/uL (ref 150–400)
RBC: 4.13 MIL/uL — ABNORMAL LOW (ref 4.22–5.81)
RBC: 4.14 MIL/uL — ABNORMAL LOW (ref 4.22–5.81)
RBC: 4.33 MIL/uL (ref 4.22–5.81)
RBC: 4.43 MIL/uL (ref 4.22–5.81)
RBC: 4.55 MIL/uL (ref 4.22–5.81)
RBC: 4.65 MIL/uL (ref 4.22–5.81)
RBC: 4.68 MIL/uL (ref 4.22–5.81)
RBC: 4.75 MIL/uL (ref 4.22–5.81)
RDW: 15.1 % (ref 11.5–15.5)
RDW: 15.2 % (ref 11.5–15.5)
RDW: 15.4 % (ref 11.5–15.5)
RDW: 15.4 % (ref 11.5–15.5)
RDW: 15 % (ref 11.5–15.5)
RDW: 15.1 % (ref 11.5–15.5)
RDW: 15.3 % (ref 11.5–15.5)
RDW: 15.3 % (ref 11.5–15.5)
WBC: 6.9 10*3/uL (ref 4.0–10.5)
WBC: 7.2 10*3/uL (ref 4.0–10.5)
WBC: 7.2 10*3/uL (ref 4.0–10.5)
WBC: 7.9 10*3/uL (ref 4.0–10.5)
WBC: 9.3 10*3/uL (ref 4.0–10.5)

## 2010-05-09 LAB — COMPREHENSIVE METABOLIC PANEL
Albumin: 3 g/dL — ABNORMAL LOW (ref 3.5–5.2)
Albumin: 3 g/dL — ABNORMAL LOW (ref 3.5–5.2)
Alkaline Phosphatase: 72 U/L (ref 39–117)
BUN: 18 mg/dL (ref 6–23)
BUN: 22 mg/dL (ref 6–23)
Calcium: 9.2 mg/dL (ref 8.4–10.5)
Creatinine, Ser: 1.4 mg/dL (ref 0.4–1.5)
Potassium: 4 mEq/L (ref 3.5–5.1)
Potassium: 4.4 mEq/L (ref 3.5–5.1)
Sodium: 141 mEq/L (ref 135–145)
Total Protein: 7.1 g/dL (ref 6.0–8.3)
Total Protein: 7.3 g/dL (ref 6.0–8.3)

## 2010-05-09 LAB — POCT CARDIAC MARKERS
CKMB, poc: <1 ng/mL — ABNORMAL LOW (ref 1.0–8.0)
Myoglobin, poc: 79.7 ng/mL (ref 12–200)
Troponin i, poc: <0.05 ng/mL (ref 0.00–0.09)

## 2010-05-09 LAB — CK TOTAL AND CKMB (NOT AT ARMC)
CK, MB: 1.3 ng/mL (ref 0.3–4.0)
CK, MB: 2.4 ng/mL (ref 0.3–4.0)
CK, MB: 0.9 ng/mL (ref 0.3–4.0)
Relative Index: 1.4 (ref 0.0–2.5)
Relative Index: INVALID (ref 0.0–2.5)
Total CK: 168 U/L (ref 7–232)

## 2010-05-09 LAB — PROTIME-INR
INR: 1.3 (ref 0.00–1.49)
INR: 1.6 — ABNORMAL HIGH (ref 0.00–1.49)
INR: 1.9 — ABNORMAL HIGH (ref 0.00–1.49)
Prothrombin Time: 22.8 seconds — ABNORMAL HIGH (ref 11.6–15.2)

## 2010-05-09 LAB — GLUCOSE, CAPILLARY
Glucose-Capillary: 104 mg/dL — ABNORMAL HIGH (ref 70–99)
Glucose-Capillary: 107 mg/dL — ABNORMAL HIGH (ref 70–99)
Glucose-Capillary: 109 mg/dL — ABNORMAL HIGH (ref 70–99)
Glucose-Capillary: 115 mg/dL — ABNORMAL HIGH (ref 70–99)
Glucose-Capillary: 119 mg/dL — ABNORMAL HIGH (ref 70–99)
Glucose-Capillary: 137 mg/dL — ABNORMAL HIGH (ref 70–99)
Glucose-Capillary: 175 mg/dL — ABNORMAL HIGH (ref 70–99)
Glucose-Capillary: 72 mg/dL (ref 70–99)
Glucose-Capillary: 74 mg/dL (ref 70–99)
Glucose-Capillary: 75 mg/dL (ref 70–99)
Glucose-Capillary: 86 mg/dL (ref 70–99)
Glucose-Capillary: 89 mg/dL (ref 70–99)
Glucose-Capillary: 90 mg/dL (ref 70–99)
Glucose-Capillary: 97 mg/dL (ref 70–99)
Glucose-Capillary: 100 mg/dL — ABNORMAL HIGH (ref 70–99)
Glucose-Capillary: 117 mg/dL — ABNORMAL HIGH (ref 70–99)
Glucose-Capillary: 122 mg/dL — ABNORMAL HIGH (ref 70–99)
Glucose-Capillary: 124 mg/dL — ABNORMAL HIGH (ref 70–99)
Glucose-Capillary: 132 mg/dL — ABNORMAL HIGH (ref 70–99)
Glucose-Capillary: 137 mg/dL — ABNORMAL HIGH (ref 70–99)
Glucose-Capillary: 141 mg/dL — ABNORMAL HIGH (ref 70–99)
Glucose-Capillary: 143 mg/dL — ABNORMAL HIGH (ref 70–99)
Glucose-Capillary: 151 mg/dL — ABNORMAL HIGH (ref 70–99)
Glucose-Capillary: 190 mg/dL — ABNORMAL HIGH (ref 70–99)

## 2010-05-09 LAB — LIPID PANEL
Cholesterol: 100 mg/dL (ref 0–200)
HDL: 45 mg/dL (ref >39)
LDL Cholesterol: 46 mg/dL (ref 0–99)
Triglycerides: 44 mg/dL (ref <150)

## 2010-05-09 LAB — CARDIAC PANEL(CRET KIN+CKTOT+MB+TROPI)
CK, MB: 1.7 ng/mL (ref 0.3–4.0)
CK, MB: 0.9 ng/mL (ref 0.3–4.0)
Relative Index: 1.1 (ref 0.0–2.5)
Relative Index: 1.2 (ref 0.0–2.5)
Total CK: 110 U/L (ref 7–232)
Total CK: 120 U/L (ref 7–232)
Total CK: 166 U/L (ref 7–232)
Total CK: 71 U/L (ref 7–232)
Total CK: 83 U/L (ref 7–232)
Troponin I: 0.03 ng/mL (ref 0.00–0.06)
Troponin I: 0.16 ng/mL — ABNORMAL HIGH (ref 0.00–0.06)
Troponin I: 0.27 ng/mL — ABNORMAL HIGH (ref 0.00–0.06)
Troponin I: 0.02 ng/mL (ref 0.00–0.06)

## 2010-05-09 LAB — HEPARIN LEVEL (UNFRACTIONATED)
Heparin Unfractionated: 0.29 IU/mL — ABNORMAL LOW (ref 0.30–0.70)
Heparin Unfractionated: 0.41 IU/mL (ref 0.30–0.70)
Heparin Unfractionated: 0.78 IU/mL — ABNORMAL HIGH (ref 0.30–0.70)
Heparin Unfractionated: 0.39 IU/mL (ref 0.30–0.70)
Heparin Unfractionated: 0.42 IU/mL (ref 0.30–0.70)
Heparin Unfractionated: 0.46 IU/mL (ref 0.30–0.70)
Heparin Unfractionated: 0.82 IU/mL — ABNORMAL HIGH (ref 0.30–0.70)
Heparin Unfractionated: 0.83 IU/mL — ABNORMAL HIGH (ref 0.30–0.70)

## 2010-05-09 LAB — APTT
aPTT: 129 seconds — ABNORMAL HIGH (ref 24–37)
aPTT: 35 seconds (ref 24–37)

## 2010-05-09 LAB — TROPONIN I: Troponin I: 0.03 ng/mL (ref 0.00–0.06)

## 2010-05-09 LAB — DIFFERENTIAL
Basophils Relative: 0 % (ref 0–1)
Eosinophils Absolute: 0.1 10*3/uL (ref 0.0–0.7)
Eosinophils Relative: 1 % (ref 0–5)
Lymphocytes Relative: 21 % (ref 12–46)
Neutro Abs: 4.9 10*3/uL (ref 1.7–7.7)

## 2010-05-09 LAB — BRAIN NATRIURETIC PEPTIDE
Pro B Natriuretic peptide (BNP): 247 pg/mL — ABNORMAL HIGH (ref 0.0–100.0)
Pro B Natriuretic peptide (BNP): 466 pg/mL — ABNORMAL HIGH (ref 0.0–100.0)

## 2010-05-09 LAB — POCT I-STAT, CHEM 8
BUN: 21 mg/dL (ref 6–23)
Calcium, Ion: 1.17 mmol/L (ref 1.12–1.32)
Creatinine, Ser: 1.9 mg/dL — ABNORMAL HIGH (ref 0.4–1.5)
Glucose, Bld: 138 mg/dL — ABNORMAL HIGH (ref 70–99)
TCO2: 23 mmol/L (ref 0–100)

## 2010-05-29 ENCOUNTER — Emergency Department (HOSPITAL_COMMUNITY)
Admission: EM | Admit: 2010-05-29 | Discharge: 2010-05-29 | Disposition: A | Discharge status: Home / Self Care | Payer: Medicare Other | Attending: Emergency Medicine | Admitting: Emergency Medicine

## 2010-05-29 DIAGNOSIS — Z86718 Personal history of other venous thrombosis and embolism: Secondary | ICD-10-CM | POA: Insufficient documentation

## 2010-05-29 DIAGNOSIS — Z8639 Personal history of other endocrine, nutritional and metabolic disease: Secondary | ICD-10-CM | POA: Insufficient documentation

## 2010-05-29 DIAGNOSIS — L299 Pruritus, unspecified: Secondary | ICD-10-CM | POA: Insufficient documentation

## 2010-05-29 DIAGNOSIS — K219 Gastro-esophageal reflux disease without esophagitis: Secondary | ICD-10-CM | POA: Insufficient documentation

## 2010-05-29 DIAGNOSIS — E119 Type 2 diabetes mellitus without complications: Secondary | ICD-10-CM | POA: Insufficient documentation

## 2010-05-29 DIAGNOSIS — I2510 Atherosclerotic heart disease of native coronary artery without angina pectoris: Secondary | ICD-10-CM | POA: Insufficient documentation

## 2010-05-29 DIAGNOSIS — I252 Old myocardial infarction: Secondary | ICD-10-CM | POA: Insufficient documentation

## 2010-05-29 DIAGNOSIS — I10 Essential (primary) hypertension: Secondary | ICD-10-CM | POA: Insufficient documentation

## 2010-05-29 DIAGNOSIS — Z862 Personal history of diseases of the blood and blood-forming organs and certain disorders involving the immune mechanism: Secondary | ICD-10-CM | POA: Insufficient documentation

## 2010-05-29 DIAGNOSIS — R21 Rash and other nonspecific skin eruption: Secondary | ICD-10-CM | POA: Insufficient documentation

## 2010-05-29 DIAGNOSIS — Z86711 Personal history of pulmonary embolism: Secondary | ICD-10-CM | POA: Insufficient documentation

## 2010-06-06 NOTE — Cardiovascular Report (Signed)
NAMEALFONZA, TOFT                ACCOUNT NO.:  [id number]   MEDICAL RECORD NO.:  [id number]          PATIENT TYPE:  INP   LOCATION:  2505                         FACILITY:  [facility]   PHYSICIAN:  Eduardo Osier. Sharyn Lull, M.D. DATE OF BIRTH:  1929/08/18   DATE OF PROCEDURE:  03/04/2008  DATE OF DISCHARGE:                            CARDIAC CATHETERIZATION   PROCEDURES:  1. Left cardiac cath with selective left and right coronary      angiography, left ventriculography via right groin using Judkins      technique.  2. Successful percutaneous transluminal coronary angioplasty to mid      left anterior descending using 2.5 x 8 mm long apex balloon for      predilatation.  3. Successful deployment of 3.0 x 15 mm long XIENCE drug-eluting stent      in mid left anterior descending.  4. Successful postdilatation of the stent using 3.25 x 12 mm long [place]      Quantum balloon going up to 18 atmospheric pressure.   INDICATIONS FOR PROCEDURE:  Mr. Matherne is a 77 year old black male with  past medical history significant for coronary artery disease, history of  MI, ischemic cardiomyopathy, hypertension, non-insulin-dependent  diabetes mellitus, hypercholesteremia, and remote history of tobacco  abuse, was admitted by Dr. [name], on February 29, 2008, because of  retrosternal chest pain associated with shortness of breath and  palpitation, and was noted to be in AFib with rapid ventricular  response.  The patient received IV Lopressor and dig with control of  heart rate and was subsequently started on IV amiodarone with conversion  to sinus rhythm.  The patient was also started on IV Lasix with good  diuresis.  EKG showed left bundle-branch block with AFib with rapid  ventricular response.  His BNP was elevated up to 960.  The patient  ruled in for non-Q-wave myocardial infarction with minimally elevated  troponin I of 0.18, 0.43 due to chest pain and recent congestive heart  failure and elevated  troponin I and multiple risk factors.  I discussed  with the patient at length regarding left cath, possible PTCA stenting,  its risks and benefits i.e., death, MI, stroke, need for emergency CABG,  risk of restenosis, local vascular complications, etc., and consented  for the procedure.   PROCEDURE:  After obtaining informed consent, the patient was brought to  the cath lab and was placed on fluoroscopy table.  The right groin was  prepped and draped in usual fashion.  Xylocaine 2% was used for local  anesthesia in the right groin.  With the help of thin-wall needle, a 6-  French arterial sheath was placed.  The sheath was aspirated and  flushed.  Next, 6-French left Judkins catheter was advanced over the  wire under fluoroscopic guidance into the ascending aorta.  Wire was  pulled out.  The catheter was aspirated and connected to the manifold.  Catheter was further advanced and engaged into left coronary ostium.  Multiple views of the left system were taken.  Next, the catheter was  disengaged and was pulled out over the  wire, and was replaced with 6-  French right Judkins catheter, which was advanced over the wire under  fluoroscopic guidance up to the ascending aorta.  Wire was pulled out.  The catheter was aspirated and connected to the manifold.  Catheter was  further advanced and engaged into right coronary ostium.  Multiple views  of the right system were taken.  Next, the catheter was disengaged and  was pulled out over the wire, and was replaced with 6-French pigtail  catheter, which was advanced over the wire under fluoroscopic guidance  into the ascending aorta.  Catheter was further advanced across aortic  valve into the LV.  LV pressures were recorded.  Next, LV graphy was  done in 30-degree RAO position.  Postangiographic pressures were  recorded from LV and then pullback pressures were recorded from the  aorta.  There was no gradient across the aortic valve.  Next, the   pigtail catheter was pulled out over the wire.  Sheaths were aspirated  and flushed.   FINDINGS:  LV showed moderate global hypokinesia with severe  anterolateral wall hypokinesia, EF of 20-25%, left main was patent.  LAD  has 10-15% proximal stenosis and 70-75% mid focal stenosis with haziness  and 70-80% distal apical smooth stenosis supplying small area of  myocardium distally at the apex.  Diagonal 1 and 2 were small, which  were patent.  Left circumflex has 30-40% mid stenosis.  OM1 and OM2 were  moderate size, which has mild disease.  OM3 was very small.  RCA has 20-  25% mid in-stent restenosis and 30-40% distal and mid junction stenosis.  PDA and PLV branches were very small.   INTERVENTIONAL PROCEDURE:  Successful percutaneous transluminal coronary  angioplasty to mid left anterior descending was done using 2.5 x 8 mm  apex balloon for predilatation and then 3.0 x 15 mm long XIENCE drug-  eluting stent was deployed at 9 atmospheric pressure.  Stent was  postdilated using 3.25 x 12 mm long [place] Quantum balloon going up to 18  atmospheric pressure.  Lesion was dilated for 70-75% to 0% residual with  excellent TIMI grade 3 distal flow without evidence of dissection or  distal embolization.  The patient received weight-based Angiomax plus  300 mg of Plavix during the procedure.  The patient tolerated the  procedure well.  There were no complications.  The patient was  transferred to recovery room in stable condition.      Eduardo Osier. Sharyn Lull, M.D.  Electronically Signed     MNH/MEDQ  D:  03/04/2008  T:  03/05/2008  Job:  [id number]   cc:   Cath Lab
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
## 2010-06-06 NOTE — Discharge Summary (Signed)
NAMECARMEL, [name]                ACCOUNT NO.:  [id number]   MEDICAL RECORD NO.:  [id number]          PATIENT TYPE:  INP   LOCATION:  3735                         FACILITY:  [facility]   PHYSICIAN:  Mohan N. Sharyn Lull, M.D. DATE OF BIRTH:  08-May-1929   DATE OF ADMISSION:  02/29/2008  DATE OF DISCHARGE:  03/08/2008                               DISCHARGE SUMMARY   ADMITTING DIAGNOSES:  1. Chest pain, rule out myocardial infarction.  2. Acute left heart systolic failure.  3. New-onset atrial fibrillation with rapid ventricular response.  4. Type 2 diabetes mellitus.   DISCHARGE DIAGNOSES:  1. Status post small non-Q-wave myocardial infarction, status post      percutaneous transluminal coronary angioplasty stenting to left      anterior descending.  2. Compensated systolic congestive heart failure.  3. Ischemic cardiomyopathy, status post atrial fibrillation/flutter      with rapid ventricular response converted to sinus rhythm.  4. Hypertension.  5. Non-insulin-dependent diabetes mellitus.  6. Hypercholesteremia.  7. Gastroesophageal reflux disease.   DISCHARGE MEDICATIONS:  1. Enteric-coated aspirin 325 mg 1 tablet daily.  2. Plavix 75 mg tablet daily with food.  3. Coreg 12.5 mg 1 tablet every 12 hours.  4. Amiodarone 200 mg 1 tablet daily.  5. Ramipril 10 mg 1 capsule twice daily.  6. Aldactone 25 mg 1 tablet daily.  7. Lasix 40 mg 1 tablet daily.  8. Pepcid 20 mg 1 tablet twice daily.  9. Crestor 20 mg 1 tablet daily.  10.Amaryl 2 mg 1 tablet daily.  11.Nitrostat 0.4 mg sublingual use as directed.   DIET:  Low-salt, low-cholesterol 1800 calories ADA diet.   ACTIVITY:  Increase activity slowly as tolerated.  The patient has been  advised for no driving, lifting, pushing, or pulling for 1 week.  Post  PTCA and stent instructions have been given.  Follow up with me in 1  week.   CONDITION AT DISCHARGE:  Stable.  The patient also has been advised to  monitor blood pressure  and blood sugar daily.   BRIEF HISTORY AND HOSPITAL COURSE:  [name] is a 77 year old black  male with past medical history significant for coronary artery disease,  history of MI in the past, status post PTCA stenting to RCA and left  circumflex many years ago, hypertension, hypercholesteremia, diabetes  mellitus, and remote history of tobacco abuse, was admitted by Dr.  [name] on February 29, 2008, because of dull, aching retrosternal chest  pain associated with shortness of breath, leg edema, and was noted to be  in AFib with rapid ventricular response.   PAST MEDICAL HISTORY:  As above.   PAST SURGICAL HISTORY:  He had appendectomy at the age of 77, had  bilateral cataract lens implants many years ago.   MEDICATIONS AT HOME:  He was on:  1. Coreg 12.5 mg p.o. b.i.d.  2. Terazosin 5 mg p.o. daily.  3. Zocor 80 mg p.o. daily.  4. Pepcid 20 mg p.o. daily.  5. Enteric-coated aspirin 81 mg p.o. daily.  6. Amaryl 2 mg p.o. daily.  7. KCl 10  mEq daily.   SOCIAL HISTORY:  He lives in [facility], separated, 2  sons and 1 daughter.  Used to smoke many years ago, quit about 30 years  ago.   FAMILY HISTORY:  Positive for coronary artery disease.   ALLERGIES:  No known drug allergies.   PHYSICAL EXAMINATION:  GENERAL:  He is alert, awake, and oriented x3.  VITAL SIGNS:  Blood pressure was 130/70, pulse was 142, came down to 70  after receiving IV Lopressor and digoxin in the ED.  HEENT:  Conjunctivae was pink.  He has bilateral lens implants.  NECK:  Supple.  Positive JVD.  HEART:  Sounds were irregularly irregular.  LUNGS:  He has bibasilar rales.  EXTREMITIES:  There is no clubbing, cyanosis, or edema.   Admission EKG showed AFib with rapid ventricular response with left  bundle-branch block pattern.  Repeat EKG done on March 01, 2008,  showed normal sinus rhythm with premature atrial contraction, left  bundle-branch block pattern.   LABORATORIES:  His  hemoglobin was 11.9, hematocrit 34.9, and white count  of 7.0.  His sodium was 141, potassium 3.2, chloride 108, bicarb 26,  glucose 88, BUN 17, and creatinine 1.46.  Admission BNP was 960.  His CK  was 168, MB 2.4, and relative index 1.4.  Troponin I was 0.18, which was  slightly elevated.  Repeat cardiac panels; CK 151, MB 1.7, and troponin  I was 0.43.  On March 02, 2008, CK was 120, MB 1.4, and troponin I is  trending down to 0.16.  BNP on March 02, 2008, was 668.  BNP today is  247.  Sodium is 139, potassium 4.2, glucose 85, BUN 17, and creatinine  1.38 which has been stable.   BRIEF HOSPITAL COURSE:  The patient was admitted to Telemetry Unit.  The  patient did was initially started on IV Cardizem and digoxin, which was  switched to IV amiodarone and his Coreg was increased gradually.  The  patient subsequently converted into sinus rhythm.  IV amiodarone was  switched to p.o.  The patient had occasional episodes of AFib with  moderate ventricular response, but for last few days, he has been in  sinus rhythm.  The patient subsequently underwent left cardiac cath with  selective left and right coronary angiography on March 04, 2008 as  per procedure report, requiring 3.0 x 15 mm XIENCE drug-eluting stent in  mid LAD.  The patient had vague chest pain postprocedure.  Three sets of  cardiac enzymes have been normal after the procedure.  Phase 1 Cardiac  Rehab was called.  The patient has been ambulating in the hallway  without any problems.  The patient states occasionally his chest pain  gets relieved with belching.  The patient was started on antacids with  relief of his chest pain.  The patient's blood sugar is fairly well  controlled.  His groin is stable with no evidence of hematoma or bruit.  The patient has been ambulating in the hallway and will be discharged  home on above medications and will be scheduled for Phase 2 Cardiac  Rehab as outpatient.  We will repeat his 2-D  echo in 6-8 weeks.  If his  EF remains below 35%, we will refer him for EP studies and possible ICD  implantation.      Eduardo Osier. Sharyn Lull, M.D.  Electronically Signed     MNH/MEDQ  D:  03/08/2008  T:  03/08/2008  Job:  [id number]
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
## 2010-06-09 NOTE — Cardiovascular Report (Signed)
NAMEJESSI, JESSOP                            ACCOUNT NO.:  [id number]   MEDICAL RECORD NO.:  [id number]                   PATIENT TYPE:  OIB   LOCATION:  6524                                 FACILITY:  [facility]   PHYSICIAN:  Mohan N. Sharyn Lull, M.D.              DATE OF BIRTH:  Dec 21, 1929   DATE OF PROCEDURE:  12/24/2002  DATE OF DISCHARGE:                              CARDIAC CATHETERIZATION   PROCEDURE:  1. Left cardiac catheterization.  2. Selective left and right coronary angiography.  3. Left ventricular angiography via right groin using Judkins technique.  4. Successful PTCA to mid right coronary artery using 2.0 x 9 mm long     Maverick balloon.  5. Successful deployment of 3.0 x 13 mm long Cypher drug-eluting stent in     mid right coronary artery.  6. Successful post dilatation of stent using 3.25 x 8 mm long Quantum     Maverick balloon.   INDICATIONS FOR PROCEDURE:  Mr. Goldwire is a 77 year old black male with past  medical history significant for MI status post PTCA and stenting to RCA and  left circumflex in the past, hypertension, hypercholesterolemia, history of  DVT, history of PE, remote history of tobacco abuse, non-insulin-dependent  diabetes mellitus.  Complains of dull, aching chest pain radiating to both  arms relieved with rest.  States gets chest pain with minimal exertion.  Denies any nausea, vomiting, diaphoresis.  Denies PND, orthopnea, leg  swelling.  Denies palpitations, lightheadedness or syncope.  The patient  denies cough, fever, chills.  Denies rest or nocturnal angina but complains  of chest pain during sexual activity.   PAST MEDICAL HISTORY:  As above.   IMPRESSION AND PLAN:  Due to multiple risk factors and typical new onset  anginal chest pain, discussed with patient regarding various options of  treatment, i.e., noninvasive stress testing versus left catheterization,  possible PTCA stent, its risks, i.e., death, MI, stroke, need for emergency  CABG, risk of restenosis, local vascular complications, etc. and consented  for PCI.   PROCEDURE:  After obtaining informed consent patient was brought to the  catheterization laboratory and was placed on fluoroscopy table.  Right groin  was prepped and draped in usual fashion.  2% Xylocaine was used for local  anesthesia in right groin.  With the help of thin wall needle, a 6-French  arterial sheath was placed.  The sheath was aspirated and flushed.  Next, 6-  French left Judkins catheter was advanced over the wire under fluoroscopic  guidance up to the ascending aorta where it was pulled out.  The catheter  was aspirated and connected to the manifold.  Catheter was further advanced  and engaged into left coronary ostium.  Multiple views of the left system  were taken.  Next, the catheter was disengaged and was pulled out over the  wire and was replaced with 6-French right Judkins catheter which  was  advanced over the wire under fluoroscopic guidance up to the ascending aorta  where it was pulled out.  The catheter was aspirated and connected to the  manifold.  Catheter was further advanced and engaged into right coronary  ostium.  Multiple views of the right system were taken.  Next, the catheter  was disengaged and was pulled out over the wire and was replaced with 6-  French pigtail catheter which was advanced over the wire under fluoroscopic  guidance up to the ascending aorta where it was pulled out.  The catheter  was aspirated and connected to the manifold.  Catheter was further advanced  across the aortic valve into the LV.  LV pressures were recorded.  Next, LV  graphy was done in 30 degree RAO position.  Post angiographic pressures were  recorded from LV and then pullback pressures were recorded from aorta.  There was no gradient across the aortic valve.  Next, the pigtail catheter  was pulled out over the wire.  Sheaths were aspirated and flushed.   FINDINGS:  LV showed  inferior wall hypokinesia.  EF of approximately 50%.  Left main was patent.  LAD has 20-25% proximal and mid stenosis and 50-60%  distal stenosis and 85-90% stenosis close to the apex in distal LAD.  Diagonal 1 and diagonal 2 were small which have mild disease.  Left  circumflex has 30-40% mid stenosis.  OM1 is medium size which is patent.  OM2 is small which is patent.  RCA has 15-20% proximal stenosis and 90-95%  mid focal stenosis beyond the stent.  Stented segments in mid and distal  portion appear to be patent.  Successful PTCA to mid right coronary artery  was done using 2.0 x 9 mm long Maverick balloon for pre dilatation and then  3.0 x 13 mm long Cypher drug-eluting stent was deployed at 10 atmospheres  pressure which was fully expanded going up to 17 atmospheres pressure and  then the stent was post dilated using 3.25 mm x 8 mm long Quantum Maverick  balloon.  Lesion was dilated from 90-95% to 0% residual with excellent TIMI  grade 3 distal flow without evidence of dissection or distal embolization.  The patient received weight based Angiomax and 300 mg of Plavix during the  procedure.  The patient tolerated procedure well.  There were no  complications.  The patient was transferred to recovery room in stable  condition.                                               Eduardo Osier. Sharyn Lull, M.D.    MNH/MEDQ  D:  12/25/2002  T:  12/25/2002  Job:  [id number]   cc:   Cath Lab

## 2010-06-09 NOTE — Cardiovascular Report (Signed)
NAMERONIT, MARCZAK                ACCOUNT NO.:  [id number]   MEDICAL RECORD NO.:  [id number]          PATIENT TYPE:  INP   LOCATION:  3705                         FACILITY:  [facility]   PHYSICIAN:  Mohan N. Sharyn Lull, M.D. DATE OF BIRTH:  1929/09/10   DATE OF PROCEDURE:  11/20/2004  DATE OF DISCHARGE:  11/21/2004                              CARDIAC CATHETERIZATION   PROCEDURE:  1.  Left cardiac catheterization with selective left and right coronary      angiography.  2.  Left ventriculography via right groin using Judkins technique.   INDICATIONS FOR PROCEDURE:  Mr. Ruark is a 77 year old black male with past  medical history significant for coronary artery disease, history of MI in  the past, status post PTCA stenting to RCA and left circumflex,  hypertension, hypercholesteremia, non-insulin-dependent diabetes mellitus,  history of DVT, remote history of tobacco abuse.  Was admitted by Dr.  [name] on October 27 because of recurrent retrosternal chest pain without  any associated symptoms.  Patient was noted to have left bundle branch block  on EKG.  MI was ruled out by serial enzymes and EKG.  Due to recurrent chest  pain and multiple risk factors discussed with patient regarding left  catheterization, possible PTCA stenting, its risks and benefits and  consented for the procedure.   PROCEDURE:  After obtaining the informed consent patient was brought to the  catheterization laboratory and was placed on fluoroscopy table.  Right groin  was prepped and draped in usual fashion.  2% Xylocaine was used for local  anesthesia in the right groin.  With the help of thin wall needle 6-French  arterial sheath was placed.  The sheath was aspirated and flushed.  Next, 6-  French left Judkins catheter was advanced over the wire under fluoroscopic  guidance up to the ascending aorta.  Wire was pulled out.  The catheter was  aspirated and connected to the manifold.  Catheter was further advanced  and  engaged into left coronary ostium.  Multiple views of the left system were  taken.  Next, the catheter was disengaged and was pulled out over the wire  and was replaced with 6-French right Judkins catheter which was advanced  over the wire under fluoroscopic guidance up to the ascending aorta.  Wire  was pulled out.  The catheter was aspirated and connected to the manifold.  Catheter was further advanced and engaged into right coronary ostium.  Multiple views of the right system were taken.  Next, the catheter was  disengaged and was pulled out over the wire and was replaced with 6-French  pigtail catheter which was advanced over the wire up to the ascending aorta.  Catheter was further advanced across the aortic valve into the LV.  LV  pressures were recorded.  Next, LV graphy was done in 30 degree RAO  position.  Post angiographic pressures were recorded from LV and then  pullback pressures were recorded from the aorta.  There was no gradient  across the aortic valve.  Next, the pigtail catheter was pulled out over the  wire.  Sheaths  were aspirated and flushed.   FINDINGS:  LV showed mild global hypokinesia, EF of 45% approximately.  Left  main was patent.  LAD has 10-15% proximal stenosis and 20-25% distal  stenosis and 80-85% apical stenosis distally.  The vessel is small and  supplying only small area of myocardium.  Diagonal 1 was small which was  patent.  Diagonal 2 and 3 were very small.  Left circumflex was patent  proximally and then diffusely diseased after giving off OM3.  OM1 and OM2  were moderate sized which were patent.  OM3 was very small which was  diffusely diseased.  RCA has 10-15%  proximal and mid stenosis and 20-25% distal stenosis.  Stented segment of  RCA in mid portion was patent.  Patient tolerated procedure well.  There  were no complications.  Patient was transferred to recovery room in stable  condition.           ______________________________   Eduardo Osier Sharyn Lull, M.D.     MNH/MEDQ  D:  11/21/2004  T:  11/21/2004  Job:  [id number]   cc:   Cath Lab

## 2010-06-29 ENCOUNTER — Other Ambulatory Visit (HOSPITAL_COMMUNITY): Payer: Self-pay | Admitting: Cardiology

## 2010-07-02 ENCOUNTER — Emergency Department (HOSPITAL_COMMUNITY): Payer: Medicare Other

## 2010-07-02 ENCOUNTER — Inpatient Hospital Stay (HOSPITAL_COMMUNITY)
Admission: EM | Admit: 2010-07-02 | Discharge: 2010-07-03 | DRG: 311 | Disposition: A | Discharge status: Home / Self Care | Payer: Medicare Other | Attending: Cardiology | Admitting: Cardiology

## 2010-07-02 DIAGNOSIS — Z7901 Long term (current) use of anticoagulants: Secondary | ICD-10-CM

## 2010-07-02 DIAGNOSIS — I252 Old myocardial infarction: Secondary | ICD-10-CM

## 2010-07-02 DIAGNOSIS — I4891 Unspecified atrial fibrillation: Secondary | ICD-10-CM | POA: Diagnosis present

## 2010-07-02 DIAGNOSIS — E039 Hypothyroidism, unspecified: Secondary | ICD-10-CM | POA: Diagnosis present

## 2010-07-02 DIAGNOSIS — I2510 Atherosclerotic heart disease of native coronary artery without angina pectoris: Secondary | ICD-10-CM | POA: Diagnosis present

## 2010-07-02 DIAGNOSIS — I209 Angina pectoris, unspecified: Principal | ICD-10-CM | POA: Diagnosis present

## 2010-07-02 DIAGNOSIS — Z87891 Personal history of nicotine dependence: Secondary | ICD-10-CM

## 2010-07-02 DIAGNOSIS — I129 Hypertensive chronic kidney disease with stage 1 through stage 4 chronic kidney disease, or unspecified chronic kidney disease: Secondary | ICD-10-CM | POA: Diagnosis present

## 2010-07-02 DIAGNOSIS — Z86718 Personal history of other venous thrombosis and embolism: Secondary | ICD-10-CM

## 2010-07-02 DIAGNOSIS — N189 Chronic kidney disease, unspecified: Secondary | ICD-10-CM | POA: Diagnosis present

## 2010-07-02 DIAGNOSIS — M79609 Pain in unspecified limb: Secondary | ICD-10-CM

## 2010-07-02 DIAGNOSIS — Z7982 Long term (current) use of aspirin: Secondary | ICD-10-CM

## 2010-07-02 DIAGNOSIS — E7800 Pure hypercholesterolemia, unspecified: Secondary | ICD-10-CM | POA: Diagnosis present

## 2010-07-02 DIAGNOSIS — E119 Type 2 diabetes mellitus without complications: Secondary | ICD-10-CM | POA: Diagnosis present

## 2010-07-02 LAB — DIFFERENTIAL
Basophils Absolute: 0 10*3/uL (ref 0.0–0.1)
Basophils Relative: 0 % (ref 0–1)
Eosinophils Absolute: 0.2 10*3/uL (ref 0.0–0.7)
Neutro Abs: 4.9 10*3/uL (ref 1.7–7.7)
Neutrophils Relative %: 64 % (ref 43–77)

## 2010-07-02 LAB — PROTIME-INR
INR: 2.62 — ABNORMAL HIGH (ref 0.00–1.49)
Prothrombin Time: 28.1 seconds — ABNORMAL HIGH (ref 11.6–15.2)

## 2010-07-02 LAB — COMPREHENSIVE METABOLIC PANEL
ALT: 77 U/L — ABNORMAL HIGH (ref 0–53)
AST: 50 U/L — ABNORMAL HIGH (ref 0–37)
Albumin: 3 g/dL — ABNORMAL LOW (ref 3.5–5.2)
Alkaline Phosphatase: 93 U/L (ref 39–117)
BUN: 23 mg/dL (ref 6–23)
Chloride: 103 mEq/L (ref 96–112)
Potassium: 3.9 mEq/L (ref 3.5–5.1)
Sodium: 138 mEq/L (ref 135–145)
Total Bilirubin: 0.3 mg/dL (ref 0.3–1.2)

## 2010-07-02 LAB — URINALYSIS, ROUTINE W REFLEX MICROSCOPIC
Bilirubin Urine: NEGATIVE
Glucose, UA: NEGATIVE mg/dL
Hgb urine dipstick: NEGATIVE
Ketones, ur: NEGATIVE mg/dL
Protein, ur: NEGATIVE mg/dL
pH: 6 (ref 5.0–8.0)

## 2010-07-02 LAB — GLUCOSE, CAPILLARY: Glucose-Capillary: 101 mg/dL — ABNORMAL HIGH (ref 70–99)

## 2010-07-02 LAB — CBC
Hemoglobin: 12.2 g/dL — ABNORMAL LOW (ref 13.0–17.0)
MCHC: 35 g/dL (ref 30.0–36.0)
Platelets: 137 10*3/uL — ABNORMAL LOW (ref 150–400)
RBC: 4.4 MIL/uL (ref 4.22–5.81)

## 2010-07-02 LAB — CK TOTAL AND CKMB (NOT AT ARMC)
CK, MB: 2.5 ng/mL (ref 0.3–4.0)
Relative Index: 1.4 (ref 0.0–2.5)

## 2010-07-02 LAB — HEMOGLOBIN A1C: Mean Plasma Glucose: 131 mg/dL — ABNORMAL HIGH (ref <117)

## 2010-07-02 LAB — APTT: aPTT: 36 seconds (ref 24–37)

## 2010-07-02 LAB — LIPID PANEL
Cholesterol: 132 mg/dL (ref 0–200)
Total CHOL/HDL Ratio: 2.6 RATIO
Triglycerides: 73 mg/dL (ref <150)
VLDL: 15 mg/dL (ref 0–40)

## 2010-07-03 ENCOUNTER — Inpatient Hospital Stay (HOSPITAL_COMMUNITY): Payer: Medicare Other

## 2010-07-03 LAB — GLUCOSE, CAPILLARY
Glucose-Capillary: 87 mg/dL (ref 70–99)
Glucose-Capillary: 169 mg/dL — ABNORMAL HIGH (ref 70–99)
Glucose-Capillary: 205 mg/dL — ABNORMAL HIGH (ref 70–99)

## 2010-07-03 LAB — BASIC METABOLIC PANEL
Calcium: 8.7 mg/dL (ref 8.4–10.5)
GFR calc non Af Amer: 41 mL/min — ABNORMAL LOW (ref >60)
Glucose, Bld: 77 mg/dL (ref 70–99)
Potassium: 4.1 mEq/L (ref 3.5–5.1)
Sodium: 139 mEq/L (ref 135–145)

## 2010-07-03 LAB — CBC
Hemoglobin: 12.1 g/dL — ABNORMAL LOW (ref 13.0–17.0)
MCHC: 34.6 g/dL (ref 30.0–36.0)
Platelets: 143 10*3/uL — ABNORMAL LOW (ref 150–400)
RBC: 4.42 MIL/uL (ref 4.22–5.81)

## 2010-07-03 MED ORDER — TECHNETIUM TC 99M TETROFOSMIN IV KIT
10.0000 | PACK | Freq: Once | INTRAVENOUS | Status: AC | PRN
  Administered 2010-07-03: 10 via INTRAVENOUS

## 2010-07-03 MED ORDER — TECHNETIUM TC 99M TETROFOSMIN IV KIT
30.0000 | PACK | Freq: Once | INTRAVENOUS | Status: AC | PRN
  Administered 2010-07-03: 30 via INTRAVENOUS

## 2010-07-04 ENCOUNTER — Other Ambulatory Visit (HOSPITAL_COMMUNITY): Payer: Medicare Other

## 2010-07-13 ENCOUNTER — Encounter: Payer: Self-pay | Admitting: Vascular Surgery
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
## 2010-07-17 ENCOUNTER — Other Ambulatory Visit (HOSPITAL_COMMUNITY): Payer: Medicare Other

## 2010-08-06 ENCOUNTER — Emergency Department (HOSPITAL_COMMUNITY): Payer: Medicare Other

## 2010-08-06 ENCOUNTER — Emergency Department (HOSPITAL_COMMUNITY)
Admission: EM | Admit: 2010-08-06 | Discharge: 2010-08-06 | Disposition: A | Discharge status: Home / Self Care | Payer: Medicare Other | Attending: Emergency Medicine | Admitting: Emergency Medicine

## 2010-08-06 DIAGNOSIS — Z86718 Personal history of other venous thrombosis and embolism: Secondary | ICD-10-CM | POA: Insufficient documentation

## 2010-08-06 DIAGNOSIS — M79609 Pain in unspecified limb: Secondary | ICD-10-CM | POA: Insufficient documentation

## 2010-08-06 DIAGNOSIS — I509 Heart failure, unspecified: Secondary | ICD-10-CM | POA: Insufficient documentation

## 2010-08-06 DIAGNOSIS — W010XXA Fall on same level from slipping, tripping and stumbling without subsequent striking against object, initial encounter: Secondary | ICD-10-CM | POA: Insufficient documentation

## 2010-08-06 DIAGNOSIS — E119 Type 2 diabetes mellitus without complications: Secondary | ICD-10-CM | POA: Insufficient documentation

## 2010-08-06 DIAGNOSIS — I10 Essential (primary) hypertension: Secondary | ICD-10-CM | POA: Insufficient documentation

## 2010-08-06 DIAGNOSIS — Z86711 Personal history of pulmonary embolism: Secondary | ICD-10-CM | POA: Insufficient documentation

## 2010-08-06 DIAGNOSIS — K219 Gastro-esophageal reflux disease without esophagitis: Secondary | ICD-10-CM | POA: Insufficient documentation

## 2010-08-06 DIAGNOSIS — M7989 Other specified soft tissue disorders: Secondary | ICD-10-CM | POA: Insufficient documentation

## 2010-08-06 DIAGNOSIS — E039 Hypothyroidism, unspecified: Secondary | ICD-10-CM | POA: Insufficient documentation

## 2010-08-06 DIAGNOSIS — M25569 Pain in unspecified knee: Secondary | ICD-10-CM | POA: Insufficient documentation

## 2010-08-06 DIAGNOSIS — I2510 Atherosclerotic heart disease of native coronary artery without angina pectoris: Secondary | ICD-10-CM | POA: Insufficient documentation

## 2010-08-06 DIAGNOSIS — I4891 Unspecified atrial fibrillation: Secondary | ICD-10-CM | POA: Insufficient documentation

## 2010-08-06 DIAGNOSIS — IMO0002 Reserved for concepts with insufficient information to code with codable children: Secondary | ICD-10-CM | POA: Insufficient documentation

## 2010-08-06 DIAGNOSIS — I252 Old myocardial infarction: Secondary | ICD-10-CM | POA: Insufficient documentation

## 2010-08-06 DIAGNOSIS — H409 Unspecified glaucoma: Secondary | ICD-10-CM | POA: Insufficient documentation

## 2010-08-06 DIAGNOSIS — E785 Hyperlipidemia, unspecified: Secondary | ICD-10-CM | POA: Insufficient documentation

## 2010-08-06 LAB — PROTIME-INR: Prothrombin Time: 33.2 seconds — ABNORMAL HIGH (ref 11.6–15.2)

## 2010-09-03 ENCOUNTER — Emergency Department (HOSPITAL_COMMUNITY)
Admission: EM | Admit: 2010-09-03 | Discharge: 2010-09-03 | Disposition: A | Discharge status: Home / Self Care | Payer: Medicare Other | Attending: Endocrinology | Admitting: Endocrinology

## 2010-09-03 ENCOUNTER — Emergency Department (HOSPITAL_COMMUNITY): Payer: Medicare Other

## 2010-09-03 DIAGNOSIS — I4891 Unspecified atrial fibrillation: Secondary | ICD-10-CM | POA: Insufficient documentation

## 2010-09-03 DIAGNOSIS — Z86718 Personal history of other venous thrombosis and embolism: Secondary | ICD-10-CM | POA: Insufficient documentation

## 2010-09-03 DIAGNOSIS — I252 Old myocardial infarction: Secondary | ICD-10-CM | POA: Insufficient documentation

## 2010-09-03 DIAGNOSIS — Z7982 Long term (current) use of aspirin: Secondary | ICD-10-CM | POA: Insufficient documentation

## 2010-09-03 DIAGNOSIS — E039 Hypothyroidism, unspecified: Secondary | ICD-10-CM | POA: Insufficient documentation

## 2010-09-03 DIAGNOSIS — Z79899 Other long term (current) drug therapy: Secondary | ICD-10-CM | POA: Insufficient documentation

## 2010-09-03 DIAGNOSIS — R142 Eructation: Secondary | ICD-10-CM | POA: Insufficient documentation

## 2010-09-03 DIAGNOSIS — E119 Type 2 diabetes mellitus without complications: Secondary | ICD-10-CM | POA: Insufficient documentation

## 2010-09-03 DIAGNOSIS — I2510 Atherosclerotic heart disease of native coronary artery without angina pectoris: Secondary | ICD-10-CM | POA: Insufficient documentation

## 2010-09-03 DIAGNOSIS — R109 Unspecified abdominal pain: Secondary | ICD-10-CM | POA: Insufficient documentation

## 2010-09-03 DIAGNOSIS — E785 Hyperlipidemia, unspecified: Secondary | ICD-10-CM | POA: Insufficient documentation

## 2010-09-03 DIAGNOSIS — K5900 Constipation, unspecified: Secondary | ICD-10-CM | POA: Insufficient documentation

## 2010-09-03 DIAGNOSIS — H409 Unspecified glaucoma: Secondary | ICD-10-CM | POA: Insufficient documentation

## 2010-09-03 DIAGNOSIS — I10 Essential (primary) hypertension: Secondary | ICD-10-CM | POA: Insufficient documentation

## 2010-09-03 DIAGNOSIS — R630 Anorexia: Secondary | ICD-10-CM | POA: Insufficient documentation

## 2010-09-03 DIAGNOSIS — R141 Gas pain: Secondary | ICD-10-CM | POA: Insufficient documentation

## 2010-09-03 DIAGNOSIS — I509 Heart failure, unspecified: Secondary | ICD-10-CM | POA: Insufficient documentation

## 2010-09-03 DIAGNOSIS — Z7901 Long term (current) use of anticoagulants: Secondary | ICD-10-CM | POA: Insufficient documentation

## 2010-09-03 LAB — DIFFERENTIAL
Basophils Absolute: 0 10*3/uL (ref 0.0–0.1)
Eosinophils Relative: 2 % (ref 0–5)
Lymphocytes Relative: 25 % (ref 12–46)
Lymphs Abs: 1.5 10*3/uL (ref 0.7–4.0)
Monocytes Absolute: 0.6 10*3/uL (ref 0.1–1.0)
Monocytes Relative: 11 % (ref 3–12)
Neutro Abs: 3.5 10*3/uL (ref 1.7–7.7)

## 2010-09-03 LAB — CBC
HCT: 34.1 % — ABNORMAL LOW (ref 39.0–52.0)
Hemoglobin: 11.7 g/dL — ABNORMAL LOW (ref 13.0–17.0)
MCH: 27.7 pg (ref 26.0–34.0)
MCHC: 34.3 g/dL (ref 30.0–36.0)
MCV: 80.6 fL (ref 78.0–100.0)
RDW: 16.6 % — ABNORMAL HIGH (ref 11.5–15.5)

## 2010-09-03 LAB — COMPREHENSIVE METABOLIC PANEL
BUN: 21 mg/dL (ref 6–23)
Calcium: 9.2 mg/dL (ref 8.4–10.5)
Creatinine, Ser: 1.87 mg/dL — ABNORMAL HIGH (ref 0.50–1.35)
GFR calc Af Amer: 42 mL/min — ABNORMAL LOW (ref >60)
Glucose, Bld: 121 mg/dL — ABNORMAL HIGH (ref 70–99)
Sodium: 140 mEq/L (ref 135–145)
Total Protein: 6.5 g/dL (ref 6.0–8.3)

## 2010-09-03 LAB — URINALYSIS, ROUTINE W REFLEX MICROSCOPIC
Nitrite: NEGATIVE
Specific Gravity, Urine: 1.016 (ref 1.005–1.030)
Urobilinogen, UA: 0.2 mg/dL (ref 0.0–1.0)

## 2010-09-03 LAB — LIPASE, BLOOD: Lipase: 41 U/L (ref 11–59)

## 2010-09-20 ENCOUNTER — Emergency Department (HOSPITAL_COMMUNITY)
Admission: EM | Admit: 2010-09-20 | Discharge: 2010-09-21 | Disposition: A | Discharge status: Home / Self Care | Payer: Medicare Other | Attending: Emergency Medicine | Admitting: Emergency Medicine

## 2010-09-20 DIAGNOSIS — M79609 Pain in unspecified limb: Secondary | ICD-10-CM | POA: Insufficient documentation

## 2010-09-20 DIAGNOSIS — Z7901 Long term (current) use of anticoagulants: Secondary | ICD-10-CM | POA: Insufficient documentation

## 2010-09-20 DIAGNOSIS — K219 Gastro-esophageal reflux disease without esophagitis: Secondary | ICD-10-CM | POA: Insufficient documentation

## 2010-09-20 DIAGNOSIS — I509 Heart failure, unspecified: Secondary | ICD-10-CM | POA: Insufficient documentation

## 2010-09-20 DIAGNOSIS — Z79899 Other long term (current) drug therapy: Secondary | ICD-10-CM | POA: Insufficient documentation

## 2010-09-20 DIAGNOSIS — I252 Old myocardial infarction: Secondary | ICD-10-CM | POA: Insufficient documentation

## 2010-09-20 DIAGNOSIS — Z7982 Long term (current) use of aspirin: Secondary | ICD-10-CM | POA: Insufficient documentation

## 2010-09-20 DIAGNOSIS — E875 Hyperkalemia: Secondary | ICD-10-CM | POA: Insufficient documentation

## 2010-09-20 DIAGNOSIS — H409 Unspecified glaucoma: Secondary | ICD-10-CM | POA: Insufficient documentation

## 2010-09-20 DIAGNOSIS — E039 Hypothyroidism, unspecified: Secondary | ICD-10-CM | POA: Insufficient documentation

## 2010-09-20 DIAGNOSIS — Z8639 Personal history of other endocrine, nutritional and metabolic disease: Secondary | ICD-10-CM | POA: Insufficient documentation

## 2010-09-20 DIAGNOSIS — I10 Essential (primary) hypertension: Secondary | ICD-10-CM | POA: Insufficient documentation

## 2010-09-20 DIAGNOSIS — E119 Type 2 diabetes mellitus without complications: Secondary | ICD-10-CM | POA: Insufficient documentation

## 2010-09-20 DIAGNOSIS — Z862 Personal history of diseases of the blood and blood-forming organs and certain disorders involving the immune mechanism: Secondary | ICD-10-CM | POA: Insufficient documentation

## 2010-09-20 DIAGNOSIS — E785 Hyperlipidemia, unspecified: Secondary | ICD-10-CM | POA: Insufficient documentation

## 2010-09-20 DIAGNOSIS — I4891 Unspecified atrial fibrillation: Secondary | ICD-10-CM | POA: Insufficient documentation

## 2010-09-20 DIAGNOSIS — I2510 Atherosclerotic heart disease of native coronary artery without angina pectoris: Secondary | ICD-10-CM | POA: Insufficient documentation

## 2010-09-20 DIAGNOSIS — Z86718 Personal history of other venous thrombosis and embolism: Secondary | ICD-10-CM | POA: Insufficient documentation

## 2010-09-20 LAB — DIFFERENTIAL
Basophils Relative: 0 % (ref 0–1)
Eosinophils Absolute: 0.2 10*3/uL (ref 0.0–0.7)
Eosinophils Relative: 2 % (ref 0–5)
Monocytes Relative: 12 % (ref 3–12)
Neutrophils Relative %: 56 % (ref 43–77)

## 2010-09-20 LAB — CBC
MCH: 28 pg (ref 26.0–34.0)
Platelets: 154 10*3/uL (ref 150–400)
RBC: 4.46 MIL/uL (ref 4.22–5.81)
RDW: 16.5 % — ABNORMAL HIGH (ref 11.5–15.5)
WBC: 6.8 10*3/uL (ref 4.0–10.5)

## 2010-09-20 LAB — BASIC METABOLIC PANEL
CO2: 24 mEq/L (ref 19–32)
Calcium: 9.6 mg/dL (ref 8.4–10.5)
GFR calc Af Amer: 30 mL/min — ABNORMAL LOW (ref >60)
Sodium: 138 mEq/L (ref 135–145)

## 2010-09-20 LAB — GLUCOSE, CAPILLARY: Glucose-Capillary: 71 mg/dL (ref 70–99)

## 2010-09-21 ENCOUNTER — Emergency Department (HOSPITAL_COMMUNITY): Payer: Medicare Other

## 2010-09-21 LAB — PROTIME-INR
INR: 3.38 — ABNORMAL HIGH (ref 0.00–1.49)
Prothrombin Time: 34.7 seconds — ABNORMAL HIGH (ref 11.6–15.2)

## 2010-09-21 LAB — POTASSIUM: Potassium: 5.4 mEq/L — ABNORMAL HIGH (ref 3.5–5.1)

## 2010-09-28 ENCOUNTER — Emergency Department (HOSPITAL_COMMUNITY): Payer: Medicare Other

## 2010-09-28 ENCOUNTER — Inpatient Hospital Stay (HOSPITAL_COMMUNITY)
Admission: EM | Admit: 2010-09-28 | Discharge: 2010-10-03 | DRG: 698 | Disposition: A | Discharge status: Home / Self Care | Payer: Medicare Other | Source: Ambulatory Visit | Attending: Cardiology | Admitting: Cardiology

## 2010-09-28 DIAGNOSIS — Z7982 Long term (current) use of aspirin: Secondary | ICD-10-CM

## 2010-09-28 DIAGNOSIS — N182 Chronic kidney disease, stage 2 (mild): Secondary | ICD-10-CM | POA: Diagnosis present

## 2010-09-28 DIAGNOSIS — I2510 Atherosclerotic heart disease of native coronary artery without angina pectoris: Secondary | ICD-10-CM | POA: Diagnosis present

## 2010-09-28 DIAGNOSIS — I252 Old myocardial infarction: Secondary | ICD-10-CM

## 2010-09-28 DIAGNOSIS — Z87891 Personal history of nicotine dependence: Secondary | ICD-10-CM

## 2010-09-28 DIAGNOSIS — D649 Anemia, unspecified: Secondary | ICD-10-CM | POA: Diagnosis present

## 2010-09-28 DIAGNOSIS — Z7901 Long term (current) use of anticoagulants: Secondary | ICD-10-CM

## 2010-09-28 DIAGNOSIS — E119 Type 2 diabetes mellitus without complications: Secondary | ICD-10-CM | POA: Diagnosis present

## 2010-09-28 DIAGNOSIS — K859 Acute pancreatitis without necrosis or infection, unspecified: Secondary | ICD-10-CM | POA: Diagnosis present

## 2010-09-28 DIAGNOSIS — Z86718 Personal history of other venous thrombosis and embolism: Secondary | ICD-10-CM

## 2010-09-28 DIAGNOSIS — R0789 Other chest pain: Secondary | ICD-10-CM | POA: Diagnosis present

## 2010-09-28 DIAGNOSIS — Z9861 Coronary angioplasty status: Secondary | ICD-10-CM

## 2010-09-28 DIAGNOSIS — I129 Hypertensive chronic kidney disease with stage 1 through stage 4 chronic kidney disease, or unspecified chronic kidney disease: Secondary | ICD-10-CM | POA: Diagnosis present

## 2010-09-28 DIAGNOSIS — E039 Hypothyroidism, unspecified: Secondary | ICD-10-CM | POA: Diagnosis present

## 2010-09-28 DIAGNOSIS — N289 Disorder of kidney and ureter, unspecified: Principal | ICD-10-CM | POA: Diagnosis present

## 2010-09-28 LAB — POCT I-STAT, CHEM 8
BUN: 42 mg/dL — ABNORMAL HIGH (ref 6–23)
Calcium, Ion: 1.25 mmol/L (ref 1.12–1.32)
Chloride: 109 mEq/L (ref 96–112)
Creatinine, Ser: 3.6 mg/dL — ABNORMAL HIGH (ref 0.50–1.35)
Glucose, Bld: 104 mg/dL — ABNORMAL HIGH (ref 70–99)
TCO2: 20 mmol/L (ref 0–100)

## 2010-09-28 LAB — TSH: TSH: 2.732 u[IU]/mL (ref 0.350–4.500)

## 2010-09-28 LAB — COMPREHENSIVE METABOLIC PANEL
ALT: 127 U/L — ABNORMAL HIGH (ref 0–53)
AST: 68 U/L — ABNORMAL HIGH (ref 0–37)
Alkaline Phosphatase: 93 U/L (ref 39–117)
Calcium: 9.6 mg/dL (ref 8.4–10.5)
Potassium: 5.8 mEq/L — ABNORMAL HIGH (ref 3.5–5.1)
Sodium: 136 mEq/L (ref 135–145)
Total Protein: 7 g/dL (ref 6.0–8.3)

## 2010-09-28 LAB — URINE MICROSCOPIC-ADD ON

## 2010-09-28 LAB — HEPATIC FUNCTION PANEL
ALT: 131 U/L — ABNORMAL HIGH (ref 0–53)
Albumin: 3.3 g/dL — ABNORMAL LOW (ref 3.5–5.2)
Alkaline Phosphatase: 92 U/L (ref 39–117)
Indirect Bilirubin: 0.2 mg/dL — ABNORMAL LOW (ref 0.3–0.9)
Total Protein: 6.8 g/dL (ref 6.0–8.3)

## 2010-09-28 LAB — PROTIME-INR: Prothrombin Time: 36.5 seconds — ABNORMAL HIGH (ref 11.6–15.2)

## 2010-09-28 LAB — APTT: aPTT: 39 seconds — ABNORMAL HIGH (ref 24–37)

## 2010-09-28 LAB — URINALYSIS, ROUTINE W REFLEX MICROSCOPIC
Bilirubin Urine: NEGATIVE
Glucose, UA: NEGATIVE mg/dL
Hgb urine dipstick: NEGATIVE
Protein, ur: NEGATIVE mg/dL
Urobilinogen, UA: 0.2 mg/dL (ref 0.0–1.0)

## 2010-09-28 LAB — CK TOTAL AND CKMB (NOT AT ARMC)
CK, MB: 3.7 ng/mL (ref 0.3–4.0)
Relative Index: 2.3 (ref 0.0–2.5)
Total CK: 162 U/L (ref 7–232)

## 2010-09-28 LAB — DIFFERENTIAL
Eosinophils Relative: 2 % (ref 0–5)
Lymphocytes Relative: 24 % (ref 12–46)
Lymphs Abs: 1.6 10*3/uL (ref 0.7–4.0)
Monocytes Absolute: 0.7 10*3/uL (ref 0.1–1.0)
Monocytes Relative: 11 % (ref 3–12)
Neutro Abs: 4.1 10*3/uL (ref 1.7–7.7)

## 2010-09-28 LAB — CARDIAC PANEL(CRET KIN+CKTOT+MB+TROPI)
CK, MB: 3.8 ng/mL (ref 0.3–4.0)
Relative Index: 2.2 (ref 0.0–2.5)
Total CK: 171 U/L (ref 7–232)
Troponin I: <0.3 ng/mL

## 2010-09-28 LAB — POCT I-STAT TROPONIN I: Troponin i, poc: 0 ng/mL (ref 0.00–0.08)

## 2010-09-28 LAB — AMYLASE: Amylase: 205 U/L — ABNORMAL HIGH (ref 0–105)

## 2010-09-28 LAB — GLUCOSE, CAPILLARY
Glucose-Capillary: 107 mg/dL — ABNORMAL HIGH (ref 70–99)
Glucose-Capillary: 101 mg/dL — ABNORMAL HIGH (ref 70–99)

## 2010-09-28 LAB — CBC
HCT: 33.5 % — ABNORMAL LOW (ref 39.0–52.0)
Hemoglobin: 11.8 g/dL — ABNORMAL LOW (ref 13.0–17.0)
MCH: 28.2 pg (ref 26.0–34.0)
MCHC: 35.2 g/dL (ref 30.0–36.0)
MCV: 80 fL (ref 78.0–100.0)
RDW: 16.4 % — ABNORMAL HIGH (ref 11.5–15.5)

## 2010-09-28 LAB — LIPASE, BLOOD: Lipase: 55 U/L (ref 11–59)

## 2010-09-28 LAB — HEMOGLOBIN A1C: Hgb A1c MFr Bld: 6.3 % — ABNORMAL HIGH (ref <5.7)

## 2010-09-28 LAB — TROPONIN I: Troponin I: <0.3 ng/mL (ref <0.30)

## 2010-09-29 LAB — GLUCOSE, CAPILLARY: Glucose-Capillary: 89 mg/dL (ref 70–99)

## 2010-09-29 LAB — PROTIME-INR: INR: 3.37 — ABNORMAL HIGH (ref 0.00–1.49)

## 2010-09-29 LAB — CARDIAC PANEL(CRET KIN+CKTOT+MB+TROPI): CK, MB: 3.8 ng/mL (ref 0.3–4.0)

## 2010-09-29 LAB — LIPASE, BLOOD: Lipase: 66 U/L — ABNORMAL HIGH (ref 11–59)

## 2010-09-29 LAB — BASIC METABOLIC PANEL
CO2: 25 mEq/L (ref 19–32)
Chloride: 106 mEq/L (ref 96–112)
Potassium: 5.7 mEq/L — ABNORMAL HIGH (ref 3.5–5.1)
Sodium: 137 mEq/L (ref 135–145)

## 2010-09-29 LAB — LIPID PANEL
Cholesterol: 151 mg/dL (ref 0–200)
HDL: 67 mg/dL (ref >39)
Total CHOL/HDL Ratio: 2.3 RATIO

## 2010-09-29 LAB — CBC
Hemoglobin: 13.9 g/dL (ref 13.0–17.0)
RBC: 4.88 MIL/uL (ref 4.22–5.81)

## 2010-09-29 LAB — AMYLASE: Amylase: 226 U/L — ABNORMAL HIGH (ref 0–105)

## 2010-09-29 LAB — URINE CULTURE

## 2010-09-30 LAB — BASIC METABOLIC PANEL
CO2: 27 mEq/L (ref 19–32)
Calcium: 9.2 mg/dL (ref 8.4–10.5)
Glucose, Bld: 99 mg/dL (ref 70–99)
Sodium: 140 mEq/L (ref 135–145)

## 2010-09-30 LAB — CBC
MCH: 28.3 pg (ref 26.0–34.0)
MCV: 80.6 fL (ref 78.0–100.0)
Platelets: 186 10*3/uL (ref 150–400)
RBC: 4.95 MIL/uL (ref 4.22–5.81)
RDW: 16.4 % — ABNORMAL HIGH (ref 11.5–15.5)

## 2010-09-30 LAB — GLUCOSE, CAPILLARY
Glucose-Capillary: 122 mg/dL — ABNORMAL HIGH (ref 70–99)
Glucose-Capillary: 101 mg/dL — ABNORMAL HIGH (ref 70–99)
Glucose-Capillary: 145 mg/dL — ABNORMAL HIGH (ref 70–99)
Glucose-Capillary: 136 mg/dL — ABNORMAL HIGH (ref 70–99)

## 2010-10-01 LAB — GLUCOSE, CAPILLARY
Glucose-Capillary: 98 mg/dL (ref 70–99)
Glucose-Capillary: 107 mg/dL — ABNORMAL HIGH (ref 70–99)

## 2010-10-01 LAB — PROTIME-INR
INR: 1.94 — ABNORMAL HIGH (ref 0.00–1.49)
Prothrombin Time: 22.5 seconds — ABNORMAL HIGH (ref 11.6–15.2)

## 2010-10-02 LAB — COMPREHENSIVE METABOLIC PANEL
ALT: 111 U/L — ABNORMAL HIGH (ref 0–53)
AST: 56 U/L — ABNORMAL HIGH (ref 0–37)
Albumin: 2.9 g/dL — ABNORMAL LOW (ref 3.5–5.2)
Alkaline Phosphatase: 88 U/L (ref 39–117)
CO2: 23 mEq/L (ref 19–32)
Chloride: 105 mEq/L (ref 96–112)
Potassium: 4.2 mEq/L (ref 3.5–5.1)
Total Bilirubin: 0.4 mg/dL (ref 0.3–1.2)

## 2010-10-02 LAB — CBC
HCT: 34.8 % — ABNORMAL LOW (ref 39.0–52.0)
Hemoglobin: 12.2 g/dL — ABNORMAL LOW (ref 13.0–17.0)
RBC: 4.38 MIL/uL (ref 4.22–5.81)

## 2010-10-02 LAB — GLUCOSE, CAPILLARY: Glucose-Capillary: 109 mg/dL — ABNORMAL HIGH (ref 70–99)

## 2010-10-02 LAB — PROTIME-INR
INR: 1.77 — ABNORMAL HIGH (ref 0.00–1.49)
Prothrombin Time: 20.9 seconds — ABNORMAL HIGH (ref 11.6–15.2)

## 2010-10-03 LAB — GLUCOSE, CAPILLARY

## 2010-10-03 LAB — PROTIME-INR
INR: 1.81 — ABNORMAL HIGH (ref 0.00–1.49)
Prothrombin Time: 21.3 seconds — ABNORMAL HIGH (ref 11.6–15.2)
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
## 2010-10-19 LAB — DIFFERENTIAL
Basophils Absolute: 0
Basophils Relative: 0
Eosinophils Absolute: 0.1
Eosinophils Relative: 2
Lymphocytes Relative: 35
Lymphs Abs: 2.4
Monocytes Absolute: 0.7
Monocytes Relative: 11
Neutro Abs: 3.6
Neutrophils Relative %: 52

## 2010-10-19 LAB — COMPREHENSIVE METABOLIC PANEL
AST: 22
CO2: 29
Chloride: 106
Creatinine, Ser: 1.28
GFR calc Af Amer: >60
GFR calc non Af Amer: 55 — ABNORMAL LOW
Total Bilirubin: 0.7

## 2010-10-19 LAB — URINE MICROSCOPIC-ADD ON

## 2010-10-19 LAB — COMPREHENSIVE METABOLIC PANEL WITH GFR
ALT: 22
Albumin: 3.4 — ABNORMAL LOW
Alkaline Phosphatase: 56
BUN: 14
Calcium: 9.2
Glucose, Bld: 103 — ABNORMAL HIGH
Potassium: 3.2 — ABNORMAL LOW
Sodium: 143
Total Protein: 6.3

## 2010-10-19 LAB — CBC
HCT: 40.2
Hemoglobin: 13.1
MCHC: 32.7
MCV: 86
Platelets: 131 — ABNORMAL LOW
RBC: 4.67
RDW: 15.9 — ABNORMAL HIGH
WBC: 6.9

## 2010-10-19 LAB — URINALYSIS, ROUTINE W REFLEX MICROSCOPIC
Bilirubin Urine: NEGATIVE
Glucose, UA: NEGATIVE
Ketones, ur: NEGATIVE
Nitrite: NEGATIVE
Protein, ur: NEGATIVE
Specific Gravity, Urine: 1.015
Urobilinogen, UA: 0.2
pH: 5.5

## 2010-10-19 LAB — LIPASE, BLOOD: Lipase: 22

## 2010-11-08 LAB — BASIC METABOLIC PANEL
CO2: 28
Chloride: 110
Glucose, Bld: 135 — ABNORMAL HIGH
Potassium: 3.4 — ABNORMAL LOW
Sodium: 142

## 2010-11-08 LAB — HEPATIC FUNCTION PANEL
ALT: 22
AST: 20
Albumin: 3.3 — ABNORMAL LOW
Bilirubin, Direct: 0.1
Total Bilirubin: 0.7

## 2010-11-08 LAB — LIPID PANEL
HDL: 65
Total CHOL/HDL Ratio: 2.4
VLDL: 8

## 2010-11-27 ENCOUNTER — Emergency Department (HOSPITAL_COMMUNITY)
Admission: EM | Admit: 2010-11-27 | Discharge: 2010-11-27 | Disposition: A | Discharge status: Home / Self Care | Payer: Medicare Other | Attending: Emergency Medicine | Admitting: Emergency Medicine

## 2010-11-27 ENCOUNTER — Emergency Department (HOSPITAL_COMMUNITY): Payer: Medicare Other

## 2010-11-27 ENCOUNTER — Other Ambulatory Visit: Payer: Self-pay

## 2010-11-27 ENCOUNTER — Encounter: Payer: Self-pay | Admitting: *Deleted

## 2010-11-27 DIAGNOSIS — R509 Fever, unspecified: Secondary | ICD-10-CM | POA: Insufficient documentation

## 2010-11-27 DIAGNOSIS — R079 Chest pain, unspecified: Secondary | ICD-10-CM | POA: Insufficient documentation

## 2010-11-27 DIAGNOSIS — Z7982 Long term (current) use of aspirin: Secondary | ICD-10-CM | POA: Insufficient documentation

## 2010-11-27 DIAGNOSIS — K219 Gastro-esophageal reflux disease without esophagitis: Secondary | ICD-10-CM

## 2010-11-27 DIAGNOSIS — I519 Heart disease, unspecified: Secondary | ICD-10-CM | POA: Insufficient documentation

## 2010-11-27 DIAGNOSIS — Z7901 Long term (current) use of anticoagulants: Secondary | ICD-10-CM | POA: Insufficient documentation

## 2010-11-27 DIAGNOSIS — E119 Type 2 diabetes mellitus without complications: Secondary | ICD-10-CM | POA: Insufficient documentation

## 2010-11-27 DIAGNOSIS — R1013 Epigastric pain: Secondary | ICD-10-CM | POA: Insufficient documentation

## 2010-11-27 DIAGNOSIS — E039 Hypothyroidism, unspecified: Secondary | ICD-10-CM | POA: Insufficient documentation

## 2010-11-27 HISTORY — DX: Disorder of kidney and ureter, unspecified: N28.9

## 2010-11-27 HISTORY — DX: Dorsalgia, unspecified: M54.9

## 2010-11-27 HISTORY — DX: Other chronic pain: G89.29

## 2010-11-27 HISTORY — DX: Gastro-esophageal reflux disease without esophagitis: K21.9

## 2010-11-27 HISTORY — DX: Hypothyroidism, unspecified: E03.9

## 2010-11-27 LAB — CBC
HCT: 36.7 % — ABNORMAL LOW (ref 39.0–52.0)
Hemoglobin: 12.9 g/dL — ABNORMAL LOW (ref 13.0–17.0)
MCH: 28.6 pg (ref 26.0–34.0)
MCHC: 35.1 g/dL (ref 30.0–36.0)
MCV: 81.4 fL (ref 78.0–100.0)
Platelets: 142 10*3/uL — ABNORMAL LOW (ref 150–400)
WBC: 6.9 10*3/uL (ref 4.0–10.5)

## 2010-11-27 LAB — COMPREHENSIVE METABOLIC PANEL
AST: 45 U/L — ABNORMAL HIGH (ref 0–37)
Albumin: 3.2 g/dL — ABNORMAL LOW (ref 3.5–5.2)
Alkaline Phosphatase: 116 U/L (ref 39–117)
Chloride: 103 mEq/L (ref 96–112)
Creatinine, Ser: 2.32 mg/dL — ABNORMAL HIGH (ref 0.50–1.35)
Potassium: 3.8 mEq/L (ref 3.5–5.1)
Total Bilirubin: 0.3 mg/dL (ref 0.3–1.2)
Total Protein: 7 g/dL (ref 6.0–8.3)

## 2010-11-27 LAB — POCT I-STAT TROPONIN I: Troponin i, poc: 0.02 ng/mL (ref 0.00–0.08)

## 2010-11-27 LAB — LIPASE, BLOOD: Lipase: 63 U/L — ABNORMAL HIGH (ref 11–59)

## 2010-11-27 LAB — OCCULT BLOOD, POC DEVICE: Fecal Occult Bld: NEGATIVE

## 2010-11-27 LAB — PROTIME-INR: INR: 2.43 — ABNORMAL HIGH (ref 0.00–1.49)

## 2010-11-27 MED ORDER — GI COCKTAIL ~~LOC~~
30.0000 mL | Freq: Once | ORAL | Status: AC
  Administered 2010-11-27: 30 mL via ORAL
  Filled 2010-11-27: qty 30

## 2010-11-27 NOTE — ED Provider Notes (Signed)
History     CSN: [id number] Arrival date & time: 11/27/2010  9:18 AM   First MD Initiated Contact with Patient 11/27/10 [phone]      Chief Complaint  Patient presents with  . Abdominal Pain    (Consider location/radiation/quality/duration/timing/severity/associated sxs/prior treatment) Patient is a 77 y.o. male presenting with abdominal pain. The history is provided by the patient. No language interpreter was used.  Abdominal Pain The primary symptoms of the illness include abdominal pain, fever and nausea. The primary symptoms of the illness do not include fatigue, shortness of breath, vomiting, diarrhea, hematemesis, hematochezia or dysuria. The current episode started more than 2 days ago (Several weeks ago symptoms started.). The onset of the illness was gradual. The problem has not changed since onset. The abdominal pain began more than 2 days ago. The pain came on gradually. The abdominal pain has been unchanged since its onset. The abdominal pain is located in the epigastric region and chest. The abdominal pain radiates to the chest and epigastric region. The severity of the abdominal pain is 7/10. Eating relieves the abdominal pain.  The patient has not had a change in bowel habit. Additional symptoms associated with the illness include heartburn and constipation. Symptoms associated with the illness do not include chills, anorexia, diaphoresis, urgency, hematuria, frequency or back pain. Significant associated medical issues include GERD, diabetes and cardiac disease. Significant associated medical issues do not include PUD, inflammatory bowel disease, sickle cell disease, gallstones, liver disease, substance abuse, diverticulitis or HIV.    Past Medical History  Diagnosis Date  . GERD (gastroesophageal reflux disease)   . Hypothyroidism   . Renal disorder   . Chronic back pain   . Hx of heart artery stent     Past Surgical History  Procedure Date  . Appendectomy     History  reviewed. No pertinent family history.  History  Substance Use Topics  . Smoking status: Never Smoker   . Smokeless tobacco: Not on file  . Alcohol Use: No      Review of Systems  Constitutional: Positive for fever. Negative for chills, diaphoresis and fatigue.  Respiratory: Negative for shortness of breath.   Gastrointestinal: Positive for heartburn, nausea, abdominal pain and constipation. Negative for vomiting, diarrhea, hematochezia, anorexia and hematemesis.  Genitourinary: Negative for dysuria, urgency, frequency and hematuria.  Musculoskeletal: Negative for back pain.  All other systems reviewed and are negative.    Allergies  Review of patient's allergies indicates no known allergies.  Home Medications   Current Outpatient Rx  Name Route Sig Dispense Refill  . ALBUTEROL SULFATE HFA 108 (90 BASE) MCG/ACT IN AERS Inhalation Inhale 2 puffs into the lungs every 6 (six) hours as needed.     . AMIODARONE HCL 200 MG PO TABS Oral Take 200 mg by mouth daily.     . AMOXICILLIN-POT CLAVULANATE 875-125 MG PO TABS Oral Take 1 tablet by mouth 2 (two) times daily. For 7 days started 11/2 and ends 12/01/10.    . ASPIRIN 81 MG PO TABS Oral Take 81 mg by mouth daily.     Marland Kitchen CARVEDILOL 6.25 MG PO TABS Oral Take 6.25 mg by mouth 2 (two) times daily with a meal.     . DEXLANSOPRAZOLE 60 MG PO CPDR Oral Take 60 mg by mouth daily.     Marland Kitchen FERROUS SULFATE 325 (65 FE) MG PO TABS Oral Take 325 mg by mouth daily with breakfast.      . FLUTICASONE PROPIONATE 50 MCG/ACT NA  SUSP Nasal Place 2 sprays into the nose daily as needed. For stuffy nose and dryness    . FUROSEMIDE 40 MG PO TABS Oral Take 40 mg by mouth daily.     Marland Kitchen GLIMEPIRIDE 4 MG PO TABS Oral Take 4 mg by mouth daily before breakfast.     . ISOSORBIDE MONONITRATE ER 60 MG PO TB24 Oral Take 60 mg by mouth 2 (two) times daily.     Marland Kitchen LEVOTHYROXINE SODIUM 50 MCG PO TABS Oral Take 50 mcg by mouth daily.     Marland Kitchen POLYETHYLENE GLYCOL 3350 PO PACK Oral  Take 17 g by mouth daily.      Marland Kitchen RAMIPRIL 10 MG PO TABS Oral Take 10 mg by mouth 2 (two) times daily.     Marland Kitchen ROSUVASTATIN CALCIUM 10 MG PO TABS Oral Take 10 mg by mouth daily.     Marland Kitchen SPIRONOLACTONE 25 MG PO TABS Oral Take 25 mg by mouth daily.     Marland Kitchen TAMSULOSIN HCL 0.4 MG PO CAPS Oral Take 0.4 mg by mouth daily.      . WARFARIN SODIUM 4 MG PO TABS Oral Take 4 mg by mouth daily at 6 PM.       BP 189/63  Pulse 68  Temp(Src) 97.6 F (36.4 C) (Oral)  Resp 18  SpO2 96%  Physical Exam  Vitals reviewed. Constitutional: He is oriented to person, place, and time. He appears well-developed and well-nourished. No distress.  HENT:  Head: Normocephalic and atraumatic.  Eyes: EOM are normal. Pupils are equal, round, and reactive to light.  Neck: Normal range of motion. Neck supple. No JVD present. No thyromegaly present.  Cardiovascular: Normal rate and regular rhythm.   No murmur heard. Pulmonary/Chest: Effort normal and breath sounds normal. No respiratory distress. He has no wheezes. He has no rales. He exhibits tenderness.  Abdominal: Soft. Bowel sounds are normal. He exhibits no distension and no mass. There is tenderness. There is no rebound and no guarding.       Negative murphy's sign, obese, epigastric pain radiating to the chest.  Lymphadenopathy:    He has no cervical adenopathy.  Neurological: He is alert and oriented to person, place, and time. No cranial nerve deficit.  Skin: Skin is warm and dry. He is not diaphoretic.  Psychiatric: He has a normal mood and affect. His behavior is normal. Judgment and thought content normal.    ED Course  Procedures (including critical care time)  Labs Reviewed  COMPREHENSIVE METABOLIC PANEL - Abnormal; Notable for the following:    BUN 34 (*)    Creatinine, Ser 2.32 (*)    Albumin 3.2 (*)    AST 45 (*)    ALT 62 (*)    GFR calc non Af Amer 25 (*)    GFR calc Af Amer 29 (*)    All other components within normal limits  LIPASE, BLOOD -  Abnormal; Notable for the following:    Lipase 63 (*)    All other components within normal limits  CBC - Abnormal; Notable for the following:    Hemoglobin 12.9 (*)    HCT 36.7 (*)    RDW 15.8 (*)    Platelets 142 (*)    All other components within normal limits  PROTIME-INR - Abnormal; Notable for the following:    Prothrombin Time 26.8 (*)    INR 2.43 (*)    All other components within normal limits  OCCULT BLOOD, POC DEVICE  POCT I-STAT  TROPONIN I  OCCULT BLOOD X 1 CARD TO LAB, STOOL   Dg Chest 2 View  11/27/2010  *RADIOLOGY REPORT*  Clinical Data: Chest pain  CHEST - 2 VIEW  Comparison: Portable chest x-ray of 09/28/2010  Findings: The lungs are clear and hyperaerated.  Cardiomegaly is stable.  Bones are osteopenic with  IMPRESSION: Cardiomegaly.  Hyperaeration.  No active lung disease  Original Report Authenticated By: Juline Patch, M.D.     1. GERD (gastroesophageal reflux disease)   2. Abdominal pain       MDM  Abdominal pain likely 2 to ulcers, pain worse and alleviates with eating. Pain radiates up the epigastrium into the chest. Pt does have history of GERD. Atypical chest pain will be excluded given pt's cardiac history and chest pain. Will check EKG and troponin.  Not likely to be gall bladder related as not worsened with eating. Some intermittent constipation but no signs of rectal bleeding. Will obtain hemoccult as pt complains of dark stools on iron therapy. 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 Pain is not pleuritic. Will check INR. History of appendectomy.  Troponin is negative, EKG looks unchanged, hemoccult came back negative. Chest x-ray is stable with some hyperaeration and cardiomegaly. INR is 2.34 which is therapeutic. His blood sugars have been running lower recently and we will advise him to take only 2 mg of glipizide instead of 4 mg. We will also give GI referral for possible endoscopy to evaluate for ulcers.          Genella Mech, MD Resident 11/27/10 [phone]

## 2010-11-27 NOTE — ED Notes (Signed)
Pt assisted to restroom.  

## 2010-11-27 NOTE — ED Provider Notes (Signed)
I saw and evaluated the patient, reviewed the resident's note and I agree with the findings and plan.  Hemoccult negative. Suspect gastritis vs GERD vs PUD. GI follow up. Doubt atypical ACS. Glimizide dose was halfed secondary to hypoglycemic episodes. No indication for imaging. On ppi at this time. Close pcp and GI follow up. Constant discomfort with negative troponin   Date: 11/27/2010  Rate: 62  Rhythm: normal sinus rhythm  QRS Axis: normal  Intervals: normal  ST/T Wave abnormalities: nonspecific ST changes  Conduction Disutrbances:left bundle branch block  Narrative Interpretation:   Old EKG Reviewed: unchanged    Lyanne Co, MD 11/27/10 1458

## 2010-11-27 NOTE — ED Notes (Signed)
Pt reports GI cocktail helped with burning discomfort. Pt requesting something to eat, updated that lab results are coming back and will check with EDP when results are completed.

## 2010-11-27 NOTE — ED Notes (Signed)
Pt c/o burning and achy sensation from throat to stomach x2 weeks, worse in last few days. Pt has multiple other complaints, listed head, thyroid as well.

## 2010-11-27 NOTE — ED Notes (Signed)
Pt back from xray, family back at bedside

## 2010-11-27 NOTE — ED Notes (Signed)
Pt given urinal.

## 2010-11-27 NOTE — ED Notes (Signed)
RES at bedside 

## 2010-12-01 ENCOUNTER — Emergency Department (HOSPITAL_COMMUNITY): Payer: Medicare Other

## 2010-12-01 ENCOUNTER — Encounter (HOSPITAL_COMMUNITY): Payer: Self-pay | Admitting: *Deleted

## 2010-12-01 ENCOUNTER — Emergency Department (HOSPITAL_COMMUNITY)
Admission: EM | Admit: 2010-12-01 | Discharge: 2010-12-01 | Disposition: A | Discharge status: Home / Self Care | Payer: Medicare Other | Attending: Emergency Medicine | Admitting: Emergency Medicine

## 2010-12-01 DIAGNOSIS — E039 Hypothyroidism, unspecified: Secondary | ICD-10-CM | POA: Insufficient documentation

## 2010-12-01 DIAGNOSIS — R109 Unspecified abdominal pain: Secondary | ICD-10-CM | POA: Insufficient documentation

## 2010-12-01 DIAGNOSIS — K5900 Constipation, unspecified: Secondary | ICD-10-CM | POA: Insufficient documentation

## 2010-12-01 DIAGNOSIS — N289 Disorder of kidney and ureter, unspecified: Secondary | ICD-10-CM | POA: Insufficient documentation

## 2010-12-01 DIAGNOSIS — R10819 Abdominal tenderness, unspecified site: Secondary | ICD-10-CM | POA: Insufficient documentation

## 2010-12-01 DIAGNOSIS — R059 Cough, unspecified: Secondary | ICD-10-CM | POA: Insufficient documentation

## 2010-12-01 DIAGNOSIS — R079 Chest pain, unspecified: Secondary | ICD-10-CM | POA: Insufficient documentation

## 2010-12-01 DIAGNOSIS — Z9889 Other specified postprocedural states: Secondary | ICD-10-CM | POA: Insufficient documentation

## 2010-12-01 DIAGNOSIS — R110 Nausea: Secondary | ICD-10-CM | POA: Insufficient documentation

## 2010-12-01 DIAGNOSIS — Z79899 Other long term (current) drug therapy: Secondary | ICD-10-CM | POA: Insufficient documentation

## 2010-12-01 DIAGNOSIS — G8929 Other chronic pain: Secondary | ICD-10-CM | POA: Insufficient documentation

## 2010-12-01 DIAGNOSIS — M549 Dorsalgia, unspecified: Secondary | ICD-10-CM | POA: Insufficient documentation

## 2010-12-01 DIAGNOSIS — R05 Cough: Secondary | ICD-10-CM | POA: Insufficient documentation

## 2010-12-01 DIAGNOSIS — K219 Gastro-esophageal reflux disease without esophagitis: Secondary | ICD-10-CM | POA: Insufficient documentation

## 2010-12-01 LAB — COMPREHENSIVE METABOLIC PANEL
ALT: 55 U/L — ABNORMAL HIGH (ref 0–53)
AST: 40 U/L — ABNORMAL HIGH (ref 0–37)
Alkaline Phosphatase: 105 U/L (ref 39–117)
CO2: 25 mEq/L (ref 19–32)
Calcium: 9.8 mg/dL (ref 8.4–10.5)
GFR calc non Af Amer: 28 mL/min — ABNORMAL LOW (ref >90)
Potassium: 4.1 mEq/L (ref 3.5–5.1)
Sodium: 139 mEq/L (ref 135–145)

## 2010-12-01 LAB — CBC
MCV: 81.2 fL (ref 78.0–100.0)
Platelets: 126 10*3/uL — ABNORMAL LOW (ref 150–400)
RBC: 4.2 MIL/uL — ABNORMAL LOW (ref 4.22–5.81)
RDW: 15.9 % — ABNORMAL HIGH (ref 11.5–15.5)
WBC: 7.9 10*3/uL (ref 4.0–10.5)

## 2010-12-01 LAB — DIFFERENTIAL
Basophils Absolute: 0 10*3/uL (ref 0.0–0.1)
Eosinophils Relative: 2 % (ref 0–5)
Lymphocytes Relative: 28 % (ref 12–46)
Neutro Abs: 4.7 10*3/uL (ref 1.7–7.7)
Neutrophils Relative %: 60 % (ref 43–77)

## 2010-12-01 MED ORDER — ONDANSETRON 4 MG PO TBDP: 4.0000 mg | ORAL_TABLET | Freq: Three times a day (TID) | ORAL | Status: AC | PRN

## 2010-12-01 MED ORDER — ONDANSETRON 4 MG PO TBDP
4.0000 mg | ORAL_TABLET | Freq: Once | ORAL | Status: DC
  Filled 2010-12-01: qty 1

## 2010-12-01 MED ORDER — MAGNESIUM CITRATE PO SOLN: 296.0000 mL | Freq: Once | ORAL | Status: AC

## 2010-12-01 NOTE — ED Notes (Signed)
Pt states "my stomach has been hurting for 2 wks, can't eat, saw Dr. Sharyn Lull yesterday & was referred to a stomach doctor & can't have an appt until next Wed"; c/o nausea, denies vomiting; c/o cough x 2 months; LS clear

## 2010-12-02 ENCOUNTER — Telehealth (HOSPITAL_COMMUNITY): Payer: Self-pay | Admitting: Emergency Medicine

## 2010-12-02 NOTE — ED Provider Notes (Signed)
History     CSN: [id number] Arrival date & time: 12/01/2010 12:18 PM   First MD Initiated Contact with Patient 12/01/10 1418      Chief Complaint  Patient presents with  . Nausea  . Cough    (Consider location/radiation/quality/duration/timing/severity/associated sxs/prior treatment) Patient is a 77 y.o. male presenting with cough and constipation. The history is provided by the patient (the pt complains of constipation and mild cough for weeks).  Cough This is a chronic problem. The current episode started more than 1 week ago. The problem occurs every few hours. The problem has not changed since onset.The cough is non-productive. There has been no fever. Pertinent negatives include no chest pain, no sweats, no ear congestion, no headaches and no wheezing. He has tried nothing for the symptoms. He is not a smoker. His past medical history does not include bronchiectasis.  Constipation  The current episode started more than 2 weeks ago. The problem occurs continuously. The patient is experiencing no pain. The stool is described as hard. There was no prior successful therapy. Associated symptoms include nausea and coughing. Pertinent negatives include no anorexia, no fever, no abdominal pain, no diarrhea, no hematuria, no chest pain, no headaches and no rash. He has been behaving normally. He has been drinking less than usual. His past medical history does not include inflammatory bowel disease or recent abdominal injury.    Past Medical History  Diagnosis Date  . GERD (gastroesophageal reflux disease)   . Hypothyroidism   . Renal disorder   . Chronic back pain   . Hx of heart artery stent     Past Surgical History  Procedure Date  . Appendectomy     No family history on file.  History  Substance Use Topics  . Smoking status: Never Smoker   . Smokeless tobacco: Not on file  . Alcohol Use: No      Review of Systems  Constitutional: Negative for fever and fatigue.  HENT:  Negative for congestion, sinus pressure and ear discharge.   Eyes: Negative for discharge.  Respiratory: Positive for cough. Negative for wheezing.   Cardiovascular: Negative for chest pain.  Gastrointestinal: Positive for nausea and constipation. Negative for abdominal pain, diarrhea and anorexia.  Genitourinary: Negative for frequency and hematuria.  Musculoskeletal: Negative for back pain.  Skin: Negative for rash.  Neurological: Negative for seizures and headaches.  Hematological: Negative.   Psychiatric/Behavioral: Negative for hallucinations.    Allergies  Review of patient's allergies indicates no known allergies.  Home Medications   Current Outpatient Rx  Name Route Sig Dispense Refill  . ALBUTEROL SULFATE HFA 108 (90 BASE) MCG/ACT IN AERS Inhalation Inhale 2 puffs into the lungs every 6 (six) hours as needed.     . AMIODARONE HCL 200 MG PO TABS Oral Take 200 mg by mouth daily.     . AMOXICILLIN-POT CLAVULANATE 875-125 MG PO TABS Oral Take 1 tablet by mouth 2 (two) times daily. For 7 days started 11/2 and ends 12/01/10.    Marland Kitchen CALCIUM CARBONATE ANTACID 500 MG PO CHEW Oral Chew 1 tablet by mouth 3 (three) times daily as needed. For heart burn     . CARVEDILOL 6.25 MG PO TABS Oral Take 6.25 mg by mouth 2 (two) times daily with a meal.     . DEXLANSOPRAZOLE 60 MG PO CPDR Oral Take 60 mg by mouth daily.     Marland Kitchen FLUTICASONE PROPIONATE 50 MCG/ACT NA SUSP Nasal Place 2 sprays into the nose daily  as needed. For stuffy nose and dryness    . GLIMEPIRIDE 1 MG PO TABS Oral Take 1 mg by mouth daily before breakfast.      . ISOSORBIDE MONONITRATE ER 60 MG PO TB24 Oral Take 60 mg by mouth 2 (two) times daily.     Marland Kitchen KETOTIFEN FUMARATE 0.025 % OP SOLN Both Eyes Place 1 drop into both eyes 2 (two) times daily as needed. Dry eyes     . LEVOTHYROXINE SODIUM 50 MCG PO TABS Oral Take 50 mcg by mouth daily.     Marland Kitchen POLYETHYLENE GLYCOL 3350 PO PACK Oral Take 17 g by mouth daily as needed. For constipation      . POLYVINYL ALCOHOL 1.4 % OP SOLN Both Eyes Place 1 drop into both eyes as needed. For dry eyes     . RAMIPRIL 10 MG PO TABS Oral Take 10 mg by mouth 2 (two) times daily.     Marland Kitchen ROSUVASTATIN CALCIUM 10 MG PO TABS Oral Take 10 mg by mouth daily.     Marland Kitchen SPIRONOLACTONE 25 MG PO TABS Oral Take 25 mg by mouth daily.     Marland Kitchen TAMSULOSIN HCL 0.4 MG PO CAPS Oral Take 0.4 mg by mouth daily.      Barron Alvine SODIUM 4 MG PO TABS Oral Take 4 mg by mouth daily at 6 PM. Takes on Monday, Tuesday, Wednesday, and Thursday.    . FUROSEMIDE 40 MG PO TABS Oral Take 40 mg by mouth daily.     Marland Kitchen MAGNESIUM CITRATE PO SOLN Oral Take 296 mLs by mouth once. 300 mL 0    Drink half a bottle at one time for constipation  . ONDANSETRON 4 MG PO TBDP Oral Take 1 tablet (4 mg total) by mouth every 8 (eight) hours as needed for nausea. 20 tablet 0    BP 156/74  Pulse 70  Temp(Src) 98.2 F (36.8 C) (Oral)  Resp 18  Wt 172 lb (78.019 kg)  SpO2 97%  Physical Exam  Constitutional: He is oriented to person, place, and time. He appears well-developed.  HENT:  Head: Normocephalic and atraumatic.  Eyes: Conjunctivae and EOM are normal. No scleral icterus.  Neck: Neck supple. No thyromegaly present.  Cardiovascular: Normal rate and regular rhythm.  Exam reveals no gallop and no friction rub.   No murmur heard. Pulmonary/Chest: No stridor. He has no wheezes. He has no rales. He exhibits no tenderness.  Abdominal: He exhibits no distension. There is tenderness. There is no rebound.       Mild diffuse tendernous  Musculoskeletal: Normal range of motion. He exhibits no edema.  Lymphadenopathy:    He has no cervical adenopathy.  Neurological: He is oriented to person, place, and time. Coordination normal.  Skin: No rash noted. No erythema.  Psychiatric: He has a normal mood and affect. His behavior is normal.    ED Course  Procedures (including critical care time)  Labs Reviewed  CBC - Abnormal; Notable for the following:     RBC 4.20 (*)    Hemoglobin 11.9 (*)    HCT 34.1 (*)    RDW 15.9 (*)    Platelets 126 (*)    All other components within normal limits  COMPREHENSIVE METABOLIC PANEL - Abnormal; Notable for the following:    BUN 28 (*)    Creatinine, Ser 2.11 (*)    Albumin 3.2 (*)    AST 40 (*)    ALT 55 (*)    Total Bilirubin  0.2 (*)    GFR calc non Af Amer 28 (*)    GFR calc Af Amer 33 (*)    All other components within normal limits  LIPASE, BLOOD - Abnormal; Notable for the following:    Lipase 79 (*)    All other components within normal limits  DIFFERENTIAL  LAB REPORT - SCANNED   Dg Abd Acute W/chest  12/01/2010  *RADIOLOGY REPORT*  Clinical Data: Cough abdominal pain.  ACUTE ABDOMEN SERIES (ABDOMEN 2 VIEW & CHEST 1 VIEW)  Comparison: 09/03/2010  Findings: The lungs are clear without focal infiltrate, edema, pneumothorax or pleural effusion. Interstitial markings are diffusely coarsened with chronic features. Cardiopericardial silhouette is at upper limits of normal for size. Imaged bony structures of the thorax are intact.  Upright film shows no evidence for intraperitoneal free air.  The supine film shows no gaseous bowel dilatation to suggest obstruction.  Degenerative changes are noted in the lumbar spine. No unexpected abdominopelvic calcification.  IMPRESSION: Stable chest x-ray without acute cardiopulmonary findings.  No evidence for bowel perforation or obstruction.  Original Report Authenticated By: ERIC A. MANSELL, M.D.     1. Constipation    Results for orders placed during the hospital encounter of 12/01/10  CBC      Component Value Range   WBC 7.9  4.0 - 10.5 (K/uL)   RBC 4.20 (*) 4.22 - 5.81 (MIL/uL)   Hemoglobin 11.9 (*) 13.0 - 17.0 (g/dL)   HCT 16.1 (*) 09.6 - 52.0 (%)   MCV 81.2  78.0 - 100.0 (fL)   MCH 28.3  26.0 - 34.0 (pg)   MCHC 34.9  30.0 - 36.0 (g/dL)   RDW 04.5 (*) 40.9 - 15.5 (%)   Platelets 126 (*) 150 - 400 (K/uL)  DIFFERENTIAL      Component Value Range    Neutrophils Relative 60  43 - 77 (%)   Neutro Abs 4.7  1.7 - 7.7 (K/uL)   Lymphocytes Relative 28  12 - 46 (%)   Lymphs Abs 2.2  0.7 - 4.0 (K/uL)   Monocytes Relative 11  3 - 12 (%)   Monocytes Absolute 0.9  0.1 - 1.0 (K/uL)   Eosinophils Relative 2  0 - 5 (%)   Eosinophils Absolute 0.1  0.0 - 0.7 (K/uL)   Basophils Relative 0  0 - 1 (%)   Basophils Absolute 0.0  0.0 - 0.1 (K/uL)  COMPREHENSIVE METABOLIC PANEL      Component Value Range   Sodium 139  135 - 145 (mEq/L)   Potassium 4.1  3.5 - 5.1 (mEq/L)   Chloride 106  96 - 112 (mEq/L)   CO2 25  19 - 32 (mEq/L)   Glucose, Bld 97  70 - 99 (mg/dL)   BUN 28 (*) 6 - 23 (mg/dL)   Creatinine, Ser 8.11 (*) 0.50 - 1.35 (mg/dL)   Calcium 9.8  8.4 - 91.4 (mg/dL)   Total Protein 6.6  6.0 - 8.3 (g/dL)   Albumin 3.2 (*) 3.5 - 5.2 (g/dL)   AST 40 (*) 0 - 37 (U/L)   ALT 55 (*) 0 - 53 (U/L)   Alkaline Phosphatase 105  39 - 117 (U/L)   Total Bilirubin 0.2 (*) 0.3 - 1.2 (mg/dL)   GFR calc non Af Amer 28 (*) >90 (mL/min)   GFR calc Af Amer 33 (*) >90 (mL/min)  LIPASE, BLOOD      Component Value Range   Lipase 79 (*) 11 - 59 (U/L)  Dg Chest 2 View  11/27/2010  *RADIOLOGY REPORT*  Clinical Data: Chest pain  CHEST - 2 VIEW  Comparison: Portable chest x-ray of 09/28/2010  Findings: The lungs are clear and hyperaerated.  Cardiomegaly is stable.  Bones are osteopenic with  IMPRESSION: Cardiomegaly.  Hyperaeration.  No active lung disease  Original Report Authenticated By: Juline Patch, M.D.   Dg Abd Acute W/chest  12/01/2010  *RADIOLOGY REPORT*  Clinical Data: Cough abdominal pain.  ACUTE ABDOMEN SERIES (ABDOMEN 2 VIEW & CHEST 1 VIEW)  Comparison: 09/03/2010  Findings: The lungs are clear without focal infiltrate, edema, pneumothorax or pleural effusion. Interstitial markings are diffusely coarsened with chronic features. Cardiopericardial silhouette is at upper limits of normal for size. Imaged bony structures of the thorax are intact.  Upright film  shows no evidence for intraperitoneal free air.  The supine film shows no gaseous bowel dilatation to suggest obstruction.  Degenerative changes are noted in the lumbar spine. No unexpected abdominopelvic calcification.  IMPRESSION: Stable chest x-ray without acute cardiopulmonary findings.  No evidence for bowel perforation or obstruction.  Original Report Authenticated By: ERIC A. MANSELL, M.D.       MDM  Constipation,  dehydration        Benny Lennert, MD 12/02/10 1013

## 2010-12-13 ENCOUNTER — Encounter (HOSPITAL_COMMUNITY): Payer: Self-pay | Admitting: Emergency Medicine

## 2010-12-13 ENCOUNTER — Emergency Department (HOSPITAL_COMMUNITY): Payer: Medicare Other

## 2010-12-13 ENCOUNTER — Emergency Department (HOSPITAL_COMMUNITY)
Admission: EM | Admit: 2010-12-13 | Discharge: 2010-12-13 | Disposition: A | Discharge status: Home / Self Care | Payer: Medicare Other | Attending: Emergency Medicine | Admitting: Emergency Medicine

## 2010-12-13 DIAGNOSIS — R12 Heartburn: Secondary | ICD-10-CM | POA: Insufficient documentation

## 2010-12-13 DIAGNOSIS — R197 Diarrhea, unspecified: Secondary | ICD-10-CM | POA: Insufficient documentation

## 2010-12-13 DIAGNOSIS — R05 Cough: Secondary | ICD-10-CM | POA: Insufficient documentation

## 2010-12-13 DIAGNOSIS — Z79899 Other long term (current) drug therapy: Secondary | ICD-10-CM | POA: Insufficient documentation

## 2010-12-13 DIAGNOSIS — R10817 Generalized abdominal tenderness: Secondary | ICD-10-CM | POA: Insufficient documentation

## 2010-12-13 DIAGNOSIS — E039 Hypothyroidism, unspecified: Secondary | ICD-10-CM | POA: Insufficient documentation

## 2010-12-13 DIAGNOSIS — R059 Cough, unspecified: Secondary | ICD-10-CM | POA: Insufficient documentation

## 2010-12-13 DIAGNOSIS — R079 Chest pain, unspecified: Secondary | ICD-10-CM | POA: Insufficient documentation

## 2010-12-13 DIAGNOSIS — R109 Unspecified abdominal pain: Secondary | ICD-10-CM | POA: Insufficient documentation

## 2010-12-13 DIAGNOSIS — Z9889 Other specified postprocedural states: Secondary | ICD-10-CM | POA: Insufficient documentation

## 2010-12-13 DIAGNOSIS — K5900 Constipation, unspecified: Secondary | ICD-10-CM | POA: Insufficient documentation

## 2010-12-13 DIAGNOSIS — K219 Gastro-esophageal reflux disease without esophagitis: Secondary | ICD-10-CM | POA: Insufficient documentation

## 2010-12-13 DIAGNOSIS — R110 Nausea: Secondary | ICD-10-CM | POA: Insufficient documentation

## 2010-12-13 LAB — CBC
HCT: 35.2 % — ABNORMAL LOW (ref 39.0–52.0)
Hemoglobin: 12.2 g/dL — ABNORMAL LOW (ref 13.0–17.0)
MCH: 28.2 pg (ref 26.0–34.0)
MCHC: 34.7 g/dL (ref 30.0–36.0)
MCV: 81.5 fL (ref 78.0–100.0)
Platelets: 145 10*3/uL — ABNORMAL LOW (ref 150–400)
RBC: 4.32 MIL/uL (ref 4.22–5.81)
RDW: 15.2 % (ref 11.5–15.5)
WBC: 5.9 10*3/uL (ref 4.0–10.5)

## 2010-12-13 LAB — COMPREHENSIVE METABOLIC PANEL
ALT: 43 U/L (ref 0–53)
AST: 33 U/L (ref 0–37)
Albumin: 3 g/dL — ABNORMAL LOW (ref 3.5–5.2)
Alkaline Phosphatase: 93 U/L (ref 39–117)
BUN: 31 mg/dL — ABNORMAL HIGH (ref 6–23)
CO2: 27 mEq/L (ref 19–32)
Calcium: 9.1 mg/dL (ref 8.4–10.5)
Chloride: 105 mEq/L (ref 96–112)
Creatinine, Ser: 2.54 mg/dL — ABNORMAL HIGH (ref 0.50–1.35)
GFR calc Af Amer: 26 mL/min — ABNORMAL LOW (ref >90)
GFR calc non Af Amer: 22 mL/min — ABNORMAL LOW (ref >90)
Glucose, Bld: 90 mg/dL (ref 70–99)
Potassium: 4.5 mEq/L (ref 3.5–5.1)
Sodium: 139 mEq/L (ref 135–145)
Total Bilirubin: 0.3 mg/dL (ref 0.3–1.2)
Total Protein: 6.8 g/dL (ref 6.0–8.3)

## 2010-12-13 LAB — URINALYSIS, ROUTINE W REFLEX MICROSCOPIC
Bilirubin Urine: NEGATIVE
Glucose, UA: NEGATIVE mg/dL
Hgb urine dipstick: NEGATIVE
Ketones, ur: NEGATIVE mg/dL
Leukocytes, UA: NEGATIVE
Nitrite: NEGATIVE
Protein, ur: NEGATIVE mg/dL
Specific Gravity, Urine: 1.01 (ref 1.005–1.030)
Urobilinogen, UA: 0.2 mg/dL (ref 0.0–1.0)
pH: 7 (ref 5.0–8.0)

## 2010-12-13 LAB — DIFFERENTIAL
Basophils Absolute: 0 10*3/uL (ref 0.0–0.1)
Basophils Relative: 0 % (ref 0–1)
Eosinophils Absolute: 0.1 10*3/uL (ref 0.0–0.7)
Eosinophils Relative: 2 % (ref 0–5)
Lymphocytes Relative: 30 % (ref 12–46)
Lymphs Abs: 1.8 10*3/uL (ref 0.7–4.0)
Monocytes Absolute: 0.7 10*3/uL (ref 0.1–1.0)
Monocytes Relative: 11 % (ref 3–12)
Neutro Abs: 3.3 10*3/uL (ref 1.7–7.7)
Neutrophils Relative %: 56 % (ref 43–77)

## 2010-12-13 LAB — LIPASE, BLOOD: Lipase: 44 U/L (ref 11–59)

## 2010-12-13 MED ORDER — SODIUM CHLORIDE 0.9 % IV SOLN
Freq: Once | INTRAVENOUS | Status: AC
  Administered 2010-12-13: 12:00:00 via INTRAVENOUS

## 2010-12-13 MED ORDER — MORPHINE SULFATE 4 MG/ML IJ SOLN
4.0000 mg | Freq: Once | INTRAMUSCULAR | Status: AC
  Administered 2010-12-13: 4 mg via INTRAVENOUS
  Filled 2010-12-13: qty 1

## 2010-12-13 MED ORDER — ESOMEPRAZOLE MAGNESIUM 40 MG PO CPDR: 40.0000 mg | DELAYED_RELEASE_CAPSULE | Freq: Every day | ORAL | Status: DC

## 2010-12-13 MED ORDER — ONDANSETRON HCL 4 MG/2ML IJ SOLN
4.0000 mg | Freq: Once | INTRAMUSCULAR | Status: AC
  Administered 2010-12-13: 4 mg via INTRAVENOUS
  Filled 2010-12-13: qty 2

## 2010-12-13 NOTE — ED Provider Notes (Signed)
Patient is alert and calm. He has lower abdominal pain. Lower abdomen is mildly tender to palpation. He reports constipation using MiraLAX to 3 times a day when he needs it. He denies a problem at this time.  Medical screening examination/treatment/procedure(s) were conducted as a shared visit with non-physician practitioner(s) and myself.  I personally evaluated the patient during the encounter  Flint Melter, MD 12/13/10 1524

## 2010-12-13 NOTE — ED Provider Notes (Signed)
History     CSN: [id number] Arrival date & time: 12/13/2010 10:22 AM   First MD Initiated Contact with Patient 12/13/10 1025      Chief Complaint  Patient presents with  . Abdominal Pain    (Consider location/radiation/quality/duration/timing/severity/associated sxs/prior treatment) Patient is a 77 y.o. male presenting with abdominal pain. The history is provided by the patient.  Abdominal Pain The primary symptoms of the illness include abdominal pain, nausea and diarrhea. The primary symptoms of the illness do not include fever, shortness of breath, vomiting, hematemesis, hematochezia or dysuria. The onset of the illness was gradual. The problem has not changed since onset. Additional symptoms associated with the illness include heartburn. Symptoms associated with the illness do not include chills, anorexia, diaphoresis, constipation, urgency, hematuria, frequency or back pain.   Patient states the past, month he has had abdominal pain, for, which he has been seen by his primary care doctor, as well as a GI Dr.  Patient, states that today he did notice when he wiped he had some bright red blood on the toilet paper.  Patient states that on November 29th he is scheduled to have an upper endoscopy performed.  He states, that he has been having some issues with his GERD as well.  Past Medical History  Diagnosis Date  . GERD (gastroesophageal reflux disease)   . Hypothyroidism   . Renal disorder   . Chronic back pain   . Hx of heart artery stent     Past Surgical History  Procedure Date  . Appendectomy     History reviewed. No pertinent family history.  History  Substance Use Topics  . Smoking status: Never Smoker   . Smokeless tobacco: Not on file  . Alcohol Use: No      Review of Systems  Constitutional: Negative for fever, chills, diaphoresis and appetite change.  HENT: Negative for neck pain and neck stiffness.   Respiratory: Negative for cough, chest tightness and  shortness of breath.   Cardiovascular: Negative for chest pain and palpitations.  Gastrointestinal: Positive for heartburn, nausea, abdominal pain and diarrhea. Negative for vomiting, constipation, hematochezia, rectal pain, anorexia and hematemesis.  Genitourinary: Negative for dysuria, urgency, frequency and hematuria.  Musculoskeletal: Negative for back pain.  Skin: Negative for rash.  Neurological: Negative for dizziness, syncope and numbness.    Allergies  Review of patient's allergies indicates no known allergies.  Home Medications   Current Outpatient Rx  Name Route Sig Dispense Refill  . ALBUTEROL SULFATE HFA 108 (90 BASE) MCG/ACT IN AERS Inhalation Inhale 2 puffs into the lungs every 6 (six) hours as needed.     . AMIODARONE HCL 200 MG PO TABS Oral Take 200 mg by mouth daily.     . ASPIRIN 81 MG PO TABS Oral Take 81 mg by mouth daily.      Marland Kitchen CALCIUM CARBONATE ANTACID 500 MG PO CHEW Oral Chew 1 tablet by mouth 3 (three) times daily as needed. For heart burn     . CARVEDILOL 6.25 MG PO TABS Oral Take 6.25 mg by mouth 2 (two) times daily with a meal.     . DEXLANSOPRAZOLE 60 MG PO CPDR Oral Take 60 mg by mouth daily.     Marland Kitchen FLUTICASONE PROPIONATE 50 MCG/ACT NA SUSP Nasal Place 2 sprays into the nose daily as needed. For stuffy nose and dryness    . FUROSEMIDE 40 MG PO TABS Oral Take 40 mg by mouth daily.     Marland Kitchen GLIMEPIRIDE  1 MG PO TABS Oral Take 1 mg by mouth daily before breakfast.      . ISOSORBIDE MONONITRATE ER 60 MG PO TB24 Oral Take 60 mg by mouth 2 (two) times daily.     Marland Kitchen LEVOTHYROXINE SODIUM 50 MCG PO TABS Oral Take 50 mcg by mouth daily.     Marland Kitchen POLYETHYLENE GLYCOL 3350 PO PACK Oral Take 17 g by mouth daily as needed. For constipation    . POLYVINYL ALCOHOL 1.4 % OP SOLN Both Eyes Place 1 drop into both eyes as needed. For dry eyes     . PROMETHAZINE HCL 25 MG PO TABS Oral Take 25 mg by mouth every 6 (six) hours as needed. For nausea     . RANITIDINE HCL 300 MG PO CAPS Oral  Take 300 mg by mouth every evening.      Marland Kitchen ROSUVASTATIN CALCIUM 10 MG PO TABS Oral Take 10 mg by mouth daily.     Marland Kitchen SPIRONOLACTONE 25 MG PO TABS Oral Take 25 mg by mouth daily.     Marland Kitchen TAMSULOSIN HCL 0.4 MG PO CAPS Oral Take 0.4 mg by mouth daily.      Barron Alvine SODIUM 4 MG PO TABS Oral Take 4 mg by mouth daily at 6 PM. Takes on Monday, Tuesday, Wednesday, and Thursday.    Marland Kitchen KETOTIFEN FUMARATE 0.025 % OP SOLN Both Eyes Place 1 drop into both eyes 2 (two) times daily as needed. Dry eyes       BP 149/66  Pulse 53  Temp(Src) 98 F (36.7 C) (Oral)  Resp 18  Ht 5\' 8"  (1.727 m)  Wt 172 lb (78.019 kg)  BMI 26.15 kg/m2  SpO2 97%  Physical Exam  Constitutional: He is oriented to person, place, and time. He appears well-developed and well-nourished. No distress.  Neck: Normal range of motion. Neck supple.  Cardiovascular: Normal rate, regular rhythm and normal heart sounds.   Pulmonary/Chest: Effort normal and breath sounds normal.  Abdominal: Soft. He exhibits no shifting dullness, no distension, no pulsatile liver, no fluid wave, no abdominal bruit, no ascites, no pulsatile midline mass and no mass. There is generalized tenderness. There is no rigidity, no rebound and no guarding. No hernia.    Neurological: He is alert and oriented to person, place, and time.  Skin: Skin is warm and dry. No rash noted.    ED Course  Procedures (including critical care time)   Results for orders placed during the hospital encounter of 12/13/10  COMPREHENSIVE METABOLIC PANEL      Component Value Range   Sodium 139  135 - 145 (mEq/L)   Potassium 4.5  3.5 - 5.1 (mEq/L)   Chloride 105  96 - 112 (mEq/L)   CO2 27  19 - 32 (mEq/L)   Glucose, Bld 90  70 - 99 (mg/dL)   BUN 31 (*) 6 - 23 (mg/dL)   Creatinine, Ser 9.62 (*) 0.50 - 1.35 (mg/dL)   Calcium 9.1  8.4 - 95.2 (mg/dL)   Total Protein 6.8  6.0 - 8.3 (g/dL)   Albumin 3.0 (*) 3.5 - 5.2 (g/dL)   AST 33  0 - 37 (U/L)   ALT 43  0 - 53 (U/L)   Alkaline  Phosphatase 93  39 - 117 (U/L)   Total Bilirubin 0.3  0.3 - 1.2 (mg/dL)   GFR calc non Af Amer 22 (*) >90 (mL/min)   GFR calc Af Amer 26 (*) >90 (mL/min)  CBC  Component Value Range   WBC 5.9  4.0 - 10.5 (K/uL)   RBC 4.32  4.22 - 5.81 (MIL/uL)   Hemoglobin 12.2 (*) 13.0 - 17.0 (g/dL)   HCT 16.1 (*) 09.6 - 52.0 (%)   MCV 81.5  78.0 - 100.0 (fL)   MCH 28.2  26.0 - 34.0 (pg)   MCHC 34.7  30.0 - 36.0 (g/dL)   RDW 04.5  40.9 - 81.1 (%)   Platelets 145 (*) 150 - 400 (K/uL)  DIFFERENTIAL      Component Value Range   Neutrophils Relative 56  43 - 77 (%)   Neutro Abs 3.3  1.7 - 7.7 (K/uL)   Lymphocytes Relative 30  12 - 46 (%)   Lymphs Abs 1.8  0.7 - 4.0 (K/uL)   Monocytes Relative 11  3 - 12 (%)   Monocytes Absolute 0.7  0.1 - 1.0 (K/uL)   Eosinophils Relative 2  0 - 5 (%)   Eosinophils Absolute 0.1  0.0 - 0.7 (K/uL)   Basophils Relative 0  0 - 1 (%)   Basophils Absolute 0.0  0.0 - 0.1 (K/uL)  URINALYSIS, ROUTINE W REFLEX MICROSCOPIC      Component Value Range   Color, Urine YELLOW  YELLOW    Appearance CLEAR  CLEAR    Specific Gravity, Urine 1.010  1.005 - 1.030    pH 7.0  5.0 - 8.0    Glucose, UA NEGATIVE  NEGATIVE (mg/dL)   Hgb urine dipstick NEGATIVE  NEGATIVE    Bilirubin Urine NEGATIVE  NEGATIVE    Ketones, ur NEGATIVE  NEGATIVE (mg/dL)   Protein, ur NEGATIVE  NEGATIVE (mg/dL)   Urobilinogen, UA 0.2  0.0 - 1.0 (mg/dL)   Nitrite NEGATIVE  NEGATIVE    Leukocytes, UA NEGATIVE  NEGATIVE   LIPASE, BLOOD      Component Value Range   Lipase 44  11 - 59 (U/L)      Results for orders placed during the hospital encounter of 12/13/10  COMPREHENSIVE METABOLIC PANEL      Component Value Range   Sodium 139  135 - 145 (mEq/L)   Potassium 4.5  3.5 - 5.1 (mEq/L)   Chloride 105  96 - 112 (mEq/L)   CO2 27  19 - 32 (mEq/L)   Glucose, Bld 90  70 - 99 (mg/dL)   BUN 31 (*) 6 - 23 (mg/dL)   Creatinine, Ser 9.14 (*) 0.50 - 1.35 (mg/dL)   Calcium 9.1  8.4 - 78.2 (mg/dL)   Total  Protein 6.8  6.0 - 8.3 (g/dL)   Albumin 3.0 (*) 3.5 - 5.2 (g/dL)   AST 33  0 - 37 (U/L)   ALT 43  0 - 53 (U/L)   Alkaline Phosphatase 93  39 - 117 (U/L)   Total Bilirubin 0.3  0.3 - 1.2 (mg/dL)   GFR calc non Af Amer 22 (*) >90 (mL/min)   GFR calc Af Amer 26 (*) >90 (mL/min)  CBC      Component Value Range   WBC 5.9  4.0 - 10.5 (K/uL)   RBC 4.32  4.22 - 5.81 (MIL/uL)   Hemoglobin 12.2 (*) 13.0 - 17.0 (g/dL)   HCT 95.6 (*) 21.3 - 52.0 (%)   MCV 81.5  78.0 - 100.0 (fL)   MCH 28.2  26.0 - 34.0 (pg)   MCHC 34.7  30.0 - 36.0 (g/dL)   RDW 08.6  57.8 - 46.9 (%)   Platelets 145 (*) 150 - 400 (K/uL)  DIFFERENTIAL      Component Value Range   Neutrophils Relative 56  43 - 77 (%)   Neutro Abs 3.3  1.7 - 7.7 (K/uL)   Lymphocytes Relative 30  12 - 46 (%)   Lymphs Abs 1.8  0.7 - 4.0 (K/uL)   Monocytes Relative 11  3 - 12 (%)   Monocytes Absolute 0.7  0.1 - 1.0 (K/uL)   Eosinophils Relative 2  0 - 5 (%)   Eosinophils Absolute 0.1  0.0 - 0.7 (K/uL)   Basophils Relative 0  0 - 1 (%)   Basophils Absolute 0.0  0.0 - 0.1 (K/uL)  URINALYSIS, ROUTINE W REFLEX MICROSCOPIC      Component Value Range   Color, Urine YELLOW  YELLOW    Appearance CLEAR  CLEAR    Specific Gravity, Urine 1.010  1.005 - 1.030    pH 7.0  5.0 - 8.0    Glucose, UA NEGATIVE  NEGATIVE (mg/dL)   Hgb urine dipstick NEGATIVE  NEGATIVE    Bilirubin Urine NEGATIVE  NEGATIVE    Ketones, ur NEGATIVE  NEGATIVE (mg/dL)   Protein, ur NEGATIVE  NEGATIVE (mg/dL)   Urobilinogen, UA 0.2  0.0 - 1.0 (mg/dL)   Nitrite NEGATIVE  NEGATIVE    Leukocytes, UA NEGATIVE  NEGATIVE   LIPASE, BLOOD      Component Value Range   Lipase 44  11 - 59 (U/L)   Ct Abdomen Pelvis Wo Contrast  12/13/2010  *RADIOLOGY REPORT*  Clinical Data: Abdominal pain, nausea, blood in stool  CT ABDOMEN AND PELVIS WITHOUT CONTRAST  Technique:  Multidetector CT imaging of the abdomen and pelvis was performed following the standard protocol without intravenous contrast.   Comparison: 06/27/2009  Findings: Subpleural reticulation/atelectasis at the lung bases.  Unenhanced liver, spleen, pancreas, and right adrenal glands are within normal limits.  Stable mild nodularity of the left adrenal gland.  Kidneys are notable for bilateral renal cysts.  No hydronephrosis.  No evidence of bowel obstruction.  Colonic diverticulosis, without associated inflammatory changes.  Extensive atherosclerotic calcifications of the aorta and branch vessels.  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  No abdominopelvic ascites.  No suspicious abdominopelvic lymphadenopathy.  Prostate is enlarged, measuring 5.2 cm, and indents the base of the bladder.  Bladder is within normal limits.  Fat-containing bilateral inguinal hernias.  Degenerative changes of the visualized thoracolumbar spine.  IMPRESSION: No evidence of bowel obstruction.  Colonic diverticulosis, without associated inflammatory changes.  Additional stable ancillary findings as above.  Original Report Authenticated By: Charline Bills, M.D.   Dg Chest 2 View  11/27/2010  *RADIOLOGY REPORT*  Clinical Data: Chest pain  CHEST - 2 VIEW  Comparison: Portable chest x-ray of 09/28/2010  Findings: The lungs are clear and hyperaerated.  Cardiomegaly is stable.  Bones are osteopenic with  IMPRESSION: Cardiomegaly.  Hyperaeration.  No active lung disease  Original Report Authenticated By: Juline Patch, M.D.   Dg Abd Acute W/chest  12/01/2010  *RADIOLOGY REPORT*  Clinical Data: Cough abdominal pain.  ACUTE ABDOMEN SERIES (ABDOMEN 2 VIEW & CHEST 1 VIEW)  Comparison: 09/03/2010  Findings: The lungs are clear without focal infiltrate, edema, pneumothorax or pleural effusion. Interstitial markings are diffusely coarsened with chronic features. Cardiopericardial silhouette is at upper limits of normal for size. Imaged bony structures of the thorax are intact.  Upright film shows no evidence for  intraperitoneal free air.  The supine film shows no gaseous bowel dilatation to suggest obstruction.  Degenerative changes are noted in the lumbar spine. No unexpected abdominopelvic calcification.  IMPRESSION: Stable chest x-ray without acute cardiopulmonary findings.  No evidence for bowel perforation or obstruction.  Original Report Authenticated By: ERIC A. MANSELL, M.D.       MDM  The patient has chronic constipation and that may be affecting this pain. The patient is advised to use his miralax twice a day. Also will use Colace as well. Told to continue to follow up with GI. Told to increase his fluids. The patient has normal/stable labs with a negative CT scan.       Carlyle Dolly, [place] 12/13/10 [phone]

## 2010-12-13 NOTE — ED Provider Notes (Signed)
Medical screening examination/treatment/procedure(s) were conducted as a shared visit with non-physician practitioner(s) and myself.  I personally evaluated the patient during the encounter.  Patient with chronic abdominal pain, now mostly lower. He has not been taking any medications and he has known constipation with a negative CT he would be stable for discharge as his GI doctor.  Flint Melter, MD 12/13/10 [phone]

## 2010-12-13 NOTE — ED Notes (Signed)
Given contrast for CT scan to drink

## 2010-12-13 NOTE — ED Notes (Signed)
Pt has had abdominal pain for last several months. Has been evaluated prior. Today comes in because after having a BM and wiping he saw blood on the toilet paper. Pt reports loose stools since yesterday

## 2010-12-22 ENCOUNTER — Encounter (HOSPITAL_COMMUNITY)
Admission: RE | Disposition: A | Discharge status: Home / Self Care | Payer: Self-pay | Source: Ambulatory Visit | Attending: Gastroenterology

## 2010-12-22 ENCOUNTER — Encounter (HOSPITAL_COMMUNITY): Payer: Self-pay | Admitting: *Deleted

## 2010-12-22 ENCOUNTER — Other Ambulatory Visit: Payer: Self-pay | Admitting: Gastroenterology

## 2010-12-22 ENCOUNTER — Ambulatory Visit (HOSPITAL_COMMUNITY)
Admission: RE | Admit: 2010-12-22 | Discharge: 2010-12-22 | Disposition: A | Discharge status: Home / Self Care | Payer: Medicare Other | Source: Ambulatory Visit | Attending: Gastroenterology | Admitting: Gastroenterology

## 2010-12-22 DIAGNOSIS — K449 Diaphragmatic hernia without obstruction or gangrene: Secondary | ICD-10-CM | POA: Insufficient documentation

## 2010-12-22 DIAGNOSIS — R1310 Dysphagia, unspecified: Secondary | ICD-10-CM | POA: Insufficient documentation

## 2010-12-22 HISTORY — PX: ESOPHAGOGASTRODUODENOSCOPY: SHX5428

## 2010-12-22 HISTORY — DX: Cerebral infarction, unspecified: I63.9

## 2010-12-22 HISTORY — DX: Hyperlipidemia, unspecified: E78.5

## 2010-12-22 HISTORY — DX: Other pulmonary embolism without acute cor pulmonale: I26.99

## 2010-12-22 HISTORY — DX: Unspecified osteoarthritis, unspecified site: M19.90

## 2010-12-22 HISTORY — DX: Acute myocardial infarction, unspecified: I21.9

## 2010-12-22 LAB — GLUCOSE, CAPILLARY: Glucose-Capillary: 83 mg/dL (ref 70–99)

## 2010-12-22 SURGERY — EGD (ESOPHAGOGASTRODUODENOSCOPY)
Anesthesia: Moderate Sedation

## 2010-12-22 MED ORDER — FENTANYL CITRATE 0.05 MG/ML IJ SOLN
INTRAMUSCULAR | Status: AC
  Filled 2010-12-22: qty 2

## 2010-12-22 MED ORDER — MIDAZOLAM HCL 10 MG/2ML IJ SOLN
INTRAMUSCULAR | Status: AC
  Filled 2010-12-22: qty 2

## 2010-12-22 MED ORDER — FENTANYL NICU IV SYRINGE 50 MCG/ML
INJECTION | INTRAMUSCULAR | Status: DC | PRN
  Administered 2010-12-22 (×2): 25 ug via INTRAVENOUS

## 2010-12-22 MED ORDER — MIDAZOLAM HCL 10 MG/2ML IJ SOLN
INTRAMUSCULAR | Status: DC | PRN
  Administered 2010-12-22 (×2): 2 mg via INTRAVENOUS

## 2010-12-22 NOTE — Interval H&P Note (Signed)
History and Physical Interval Note:  12/22/2010 12:06 PM  [name]  has presented today for surgery, with the diagnosis of dysphagia  The various methods of treatment have been discussed with the patient and family. After consideration of risks, benefits and other options for treatment, the patient has consented to  Procedure(s): ESOPHAGOGASTRODUODENOSCOPY (EGD) as a surgical intervention .  The patients' history has been reviewed, patient examined, no change in status, stable for surgery.  I have reviewed the patients' chart and labs.  Questions were answered to the patient's satisfaction.     Ceazia Harb D

## 2010-12-22 NOTE — H&P (Signed)
  [name] complaints of intermittent dysphagia.  Last year his EGD revealed a small distal esophageal stricture in the setting of 3-4 cm hiatal hernia.  A Savary dilation was performed and he was started on Dexilant.  Unfortunately he feels that his symptoms are worse than last year.    Plan: EGD with possible dilation. Continue with Dexilant.

## 2010-12-27 ENCOUNTER — Encounter (HOSPITAL_COMMUNITY): Payer: Self-pay | Admitting: Gastroenterology

## 2011-01-11 ENCOUNTER — Encounter (HOSPITAL_COMMUNITY): Payer: Self-pay | Admitting: Emergency Medicine

## 2011-01-11 ENCOUNTER — Emergency Department (HOSPITAL_COMMUNITY)
Admission: EM | Admit: 2011-01-11 | Discharge: 2011-01-11 | Payer: Medicare Other | Attending: Emergency Medicine | Admitting: Emergency Medicine

## 2011-01-11 ENCOUNTER — Emergency Department (HOSPITAL_COMMUNITY): Payer: Medicare Other

## 2011-01-11 ENCOUNTER — Other Ambulatory Visit: Payer: Self-pay

## 2011-01-11 DIAGNOSIS — Z8679 Personal history of other diseases of the circulatory system: Secondary | ICD-10-CM | POA: Insufficient documentation

## 2011-01-11 DIAGNOSIS — K5900 Constipation, unspecified: Secondary | ICD-10-CM | POA: Insufficient documentation

## 2011-01-11 DIAGNOSIS — G8929 Other chronic pain: Secondary | ICD-10-CM | POA: Insufficient documentation

## 2011-01-11 DIAGNOSIS — R109 Unspecified abdominal pain: Secondary | ICD-10-CM | POA: Insufficient documentation

## 2011-01-11 DIAGNOSIS — K219 Gastro-esophageal reflux disease without esophagitis: Secondary | ICD-10-CM | POA: Insufficient documentation

## 2011-01-11 DIAGNOSIS — E785 Hyperlipidemia, unspecified: Secondary | ICD-10-CM | POA: Insufficient documentation

## 2011-01-11 DIAGNOSIS — I252 Old myocardial infarction: Secondary | ICD-10-CM | POA: Insufficient documentation

## 2011-01-11 DIAGNOSIS — Z86711 Personal history of pulmonary embolism: Secondary | ICD-10-CM | POA: Insufficient documentation

## 2011-01-11 DIAGNOSIS — E039 Hypothyroidism, unspecified: Secondary | ICD-10-CM | POA: Insufficient documentation

## 2011-01-11 DIAGNOSIS — M549 Dorsalgia, unspecified: Secondary | ICD-10-CM | POA: Insufficient documentation

## 2011-01-11 DIAGNOSIS — R12 Heartburn: Secondary | ICD-10-CM | POA: Insufficient documentation

## 2011-01-11 LAB — DIFFERENTIAL
Basophils Relative: 0 % (ref 0–1)
Eosinophils Relative: 3 % (ref 0–5)
Monocytes Absolute: 1.4 10*3/uL — ABNORMAL HIGH (ref 0.1–1.0)
Monocytes Relative: 18 % — ABNORMAL HIGH (ref 3–12)
Neutro Abs: 3.9 10*3/uL (ref 1.7–7.7)

## 2011-01-11 LAB — CBC
HCT: 36.9 % — ABNORMAL LOW (ref 39.0–52.0)
Hemoglobin: 12.8 g/dL — ABNORMAL LOW (ref 13.0–17.0)
MCHC: 34.7 g/dL (ref 30.0–36.0)
MCV: 81.3 fL (ref 78.0–100.0)

## 2011-01-11 LAB — POCT I-STAT, CHEM 8
BUN: 39 mg/dL — ABNORMAL HIGH (ref 6–23)
Calcium, Ion: 1.22 mmol/L (ref 1.12–1.32)
Chloride: 108 mEq/L (ref 96–112)
Glucose, Bld: 71 mg/dL (ref 70–99)
HCT: 41 % (ref 39.0–52.0)
TCO2: 26 mmol/L (ref 0–100)

## 2011-01-11 LAB — PROTIME-INR: Prothrombin Time: 24.2 seconds — ABNORMAL HIGH (ref 11.6–15.2)

## 2011-01-11 LAB — POCT I-STAT TROPONIN I

## 2011-01-11 MED ORDER — GI COCKTAIL ~~LOC~~
30.0000 mL | Freq: Once | ORAL | Status: AC
  Administered 2011-01-11: 30 mL via ORAL
  Filled 2011-01-11: qty 30

## 2011-01-11 MED ORDER — SENNOSIDES-DOCUSATE SODIUM 8.6-50 MG PO TABS: 1.0000 | ORAL_TABLET | Freq: Every day | ORAL | Status: DC

## 2011-01-11 MED ORDER — SUCRALFATE 1 GM/10ML PO SUSP: 1.0000 g | Freq: Four times a day (QID) | ORAL | Status: DC

## 2011-01-11 NOTE — ED Notes (Signed)
Received pt. From triage via w/c with complaint of epigastric pain, pt. Alert and oriented, NAD noted

## 2011-01-11 NOTE — ED Notes (Signed)
Pt st's he has acid reflux and usually hurts after eating, st's tonight pain did not subside.  Nausea without vomiting

## 2011-01-11 NOTE — ED Provider Notes (Signed)
History     CSN: [id number] Arrival date & time: 01/11/2011  1:07 AM   First MD Initiated Contact with Patient 01/11/11 0217      Chief Complaint  Patient presents with  . Abdominal Pain    (Consider location/radiation/quality/duration/timing/severity/associated sxs/prior treatment) Patient is a 77 y.o. male presenting with abdominal pain. The history is provided by the patient. No language interpreter was used.  Abdominal Pain The primary symptoms of the illness include abdominal pain. The primary symptoms of the illness do not include fever, fatigue, shortness of breath, nausea, vomiting, diarrhea, hematemesis, hematochezia or dysuria. The current episode started 6 to 12 hours ago. The onset of the illness was gradual. The problem has not changed since onset. Associated with: food intake. The patient has had a change in bowel habit (chronic constipation). Risk factors for an acute abdominal problem include being elderly. Additional symptoms associated with the illness include heartburn and constipation. Symptoms associated with the illness do not include chills, anorexia, diaphoresis, hematuria, frequency or back pain. Significant associated medical issues include GERD. Significant associated medical issues do not include inflammatory bowel disease, diabetes, sickle cell disease or substance abuse.    Past Medical History  Diagnosis Date  . GERD (gastroesophageal reflux disease)   . Hypothyroidism   . Renal disorder   . Chronic back pain   . Hx of heart artery stent   . Myocardial infarction   . Stroke   . Arthritis   . Gout   . Pulmonary embolism, bilateral 2010  . Hyperlipemia     Past Surgical History  Procedure Date  . Appendectomy   . Cardiac catheterization   . Coronary angioplasty with stent placement   . Esophagogastroduodenoscopy 12/22/2010    Procedure: ESOPHAGOGASTRODUODENOSCOPY (EGD);  Surgeon: Theda Belfast;  Location: [facility];  Service: Endoscopy;   Laterality: N/A;    No family history on file.  History  Substance Use Topics  . Smoking status: Former Games developer  . Smokeless tobacco: Not on file  . Alcohol Use: No      Review of Systems  Constitutional: Negative for fever, chills, diaphoresis and fatigue.  HENT: Negative for neck pain and neck stiffness.   Eyes: Negative for discharge.  Respiratory: Negative for shortness of breath.   Cardiovascular: Negative for chest pain.  Gastrointestinal: Positive for heartburn, abdominal pain and constipation. Negative for nausea, vomiting, diarrhea, hematochezia, abdominal distention, anorexia and hematemesis.  Genitourinary: Negative for dysuria, frequency and hematuria.  Musculoskeletal: Negative for back pain.  Skin: Negative.   Hematological: Negative.   Psychiatric/Behavioral: Negative.     Allergies  Review of patient's allergies indicates no known allergies.  Home Medications   Current Outpatient Rx  Name Route Sig Dispense Refill  . AMIODARONE HCL 200 MG PO TABS Oral Take 200 mg by mouth daily.     . ASPIRIN 81 MG PO TABS Oral Take 81 mg by mouth daily.      . BECLOMETHASONE DIPROPIONATE 80 MCG/ACT IN AERS Inhalation Inhale 1 puff into the lungs 2 (two) times daily.      Marland Kitchen CALCIUM CARBONATE ANTACID 500 MG PO CHEW Oral Chew 1 tablet by mouth 3 (three) times daily as needed. For heart burn     . CARVEDILOL 6.25 MG PO TABS Oral Take 6.25 mg by mouth 2 (two) times daily with a meal.     . DEXLANSOPRAZOLE 60 MG PO CPDR Oral Take 60 mg by mouth daily.     Marland Kitchen ESOMEPRAZOLE MAGNESIUM 40 MG  PO CPDR Oral Take 1 capsule (40 mg total) by mouth daily. 15 capsule 0  . FLUTICASONE PROPIONATE 50 MCG/ACT NA SUSP Nasal Place 2 sprays into the nose daily as needed. For stuffy nose and dryness    . FUROSEMIDE 40 MG PO TABS Oral Take 40 mg by mouth daily.     Marland Kitchen GLIMEPIRIDE 1 MG PO TABS Oral Take 1 mg by mouth daily before breakfast.      . ISOSORBIDE MONONITRATE ER 60 MG PO TB24 Oral Take 60 mg  by mouth 2 (two) times daily.     Marland Kitchen KETOTIFEN FUMARATE 0.025 % OP SOLN Both Eyes Place 1 drop into both eyes 2 (two) times daily as needed. Dry eyes     . LEVOTHYROXINE SODIUM 50 MCG PO TABS Oral Take 50 mcg by mouth daily.     Marland Kitchen POLYETHYLENE GLYCOL 3350 PO PACK Oral Take 17 g by mouth daily as needed. For constipation    . POLYVINYL ALCOHOL 1.4 % OP SOLN Both Eyes Place 1 drop into both eyes as needed. For dry eyes     . RANITIDINE HCL 300 MG PO CAPS Oral Take 300 mg by mouth every evening.      Marland Kitchen ROSUVASTATIN CALCIUM 10 MG PO TABS Oral Take 10 mg by mouth daily.     Marland Kitchen SPIRONOLACTONE 25 MG PO TABS Oral Take 25 mg by mouth daily.     Marland Kitchen TAMSULOSIN HCL 0.4 MG PO CAPS Oral Take 0.4 mg by mouth daily.      . WARFARIN SODIUM 4 MG PO TABS Oral Take 4 mg by mouth daily at 6 PM. Take daily except Friday.  Does not take on Friday      BP 158/69  Pulse 62  Temp(Src) 97.9 F (36.6 C) (Oral)  Resp 15  SpO2 99%  Physical Exam  Constitutional: He is oriented to person, place, and time. He appears well-developed and well-nourished.  HENT:  Head: Normocephalic and atraumatic.  Mouth/Throat: No oropharyngeal exudate.  Eyes: EOM are normal. Pupils are equal, round, and reactive to light.  Neck: Normal range of motion. Neck supple. No JVD present.  Cardiovascular: Normal rate and regular rhythm.   Pulmonary/Chest: Effort normal and breath sounds normal. No stridor. He has no wheezes. He has no rales. He exhibits no tenderness.  Abdominal: Soft. Bowel sounds are normal. He exhibits no distension and no mass. There is no tenderness. There is no rebound and no guarding.  Musculoskeletal: Normal range of motion. He exhibits no edema.  Lymphadenopathy:    He has no cervical adenopathy.  Neurological: He is alert and oriented to person, place, and time.  Skin: Skin is warm and dry.  Psychiatric: Thought content normal.    ED Course  Procedures (including critical care time)  Labs Reviewed  CBC -  Abnormal; Notable for the following:    Hemoglobin 12.8 (*)    HCT 36.9 (*)    Platelets 131 (*)    All other components within normal limits  DIFFERENTIAL - Abnormal; Notable for the following:    Monocytes Relative 18 (*)    Monocytes Absolute 1.4 (*)    All other components within normal limits  POCT I-STAT, CHEM 8 - Abnormal; Notable for the following:    BUN 39 (*)    Creatinine, Ser 2.70 (*)    All other components within normal limits  POCT I-STAT TROPONIN I  POCT I-STAT TROPONIN I  I-STAT TROPONIN I  I-STAT, CHEM 8  I-STAT TROPONIN I   Dg Abd Acute W/chest  01/11/2011  *RADIOLOGY REPORT*  Clinical Data: Nausea, abdominal pain.  ACUTE ABDOMEN SERIES (ABDOMEN 2 VIEW & CHEST 1 VIEW)  Comparison: 12/13/2010 CT  Findings: Heart size upper normal limits to mildly enlarged.  Mild chronic interstitial prominence without focal consolidation.  No pleural effusion or pneumothorax.  Aortic arch atherosclerosis.  Curvature of the thoracolumbar spine.  Osteopenia.  Organ outlines normal where seen.  No free intraperitoneal air identified.  Nonobstructive bowel gas pattern.  IMPRESSION: Nonobstructive bowel gas pattern.  Original Report Authenticated By: Waneta Martins, M.D.     No diagnosis found.    MDM   Date: 01/11/2011  Rate: 59  Rhythm: sinus bradycardia  QRS Axis: left  Intervals: normal  ST/T Wave abnormalities: normal  Conduction Disutrbances:none  Narrative Interpretation: LBBB old and no criteria for sgarbossa  Old EKG Reviewed: changes noted   Feels markedly improved post bowel movement and GI cocktail, 2 sets of troponins normal and normal labs and XRays.  Patient informed to return for chest pain shortness of breath nausea sweatiness.  Worsening abdominal pain or any concerns.  Follow up with your family doctor regarding the elevation in your creatinine.  Patient verbalizes understanding and agrees to follow up     Odies Desa Smitty Cords, MD 01/11/11 [phone]

## 2011-01-18 ENCOUNTER — Other Ambulatory Visit: Payer: Self-pay

## 2011-01-18 ENCOUNTER — Encounter (HOSPITAL_COMMUNITY): Payer: Self-pay | Admitting: Emergency Medicine

## 2011-01-18 ENCOUNTER — Emergency Department (HOSPITAL_COMMUNITY)
Admission: EM | Admit: 2011-01-18 | Discharge: 2011-01-18 | Disposition: A | Discharge status: Home / Self Care | Payer: Medicare Other | Attending: Emergency Medicine | Admitting: Emergency Medicine

## 2011-01-18 DIAGNOSIS — Z86718 Personal history of other venous thrombosis and embolism: Secondary | ICD-10-CM | POA: Insufficient documentation

## 2011-01-18 DIAGNOSIS — M549 Dorsalgia, unspecified: Secondary | ICD-10-CM | POA: Insufficient documentation

## 2011-01-18 DIAGNOSIS — G8929 Other chronic pain: Secondary | ICD-10-CM | POA: Insufficient documentation

## 2011-01-18 DIAGNOSIS — Z862 Personal history of diseases of the blood and blood-forming organs and certain disorders involving the immune mechanism: Secondary | ICD-10-CM | POA: Insufficient documentation

## 2011-01-18 DIAGNOSIS — Z7901 Long term (current) use of anticoagulants: Secondary | ICD-10-CM | POA: Insufficient documentation

## 2011-01-18 DIAGNOSIS — M129 Arthropathy, unspecified: Secondary | ICD-10-CM | POA: Insufficient documentation

## 2011-01-18 DIAGNOSIS — E785 Hyperlipidemia, unspecified: Secondary | ICD-10-CM | POA: Insufficient documentation

## 2011-01-18 DIAGNOSIS — R1013 Epigastric pain: Secondary | ICD-10-CM | POA: Insufficient documentation

## 2011-01-18 DIAGNOSIS — Z7982 Long term (current) use of aspirin: Secondary | ICD-10-CM | POA: Insufficient documentation

## 2011-01-18 DIAGNOSIS — K219 Gastro-esophageal reflux disease without esophagitis: Secondary | ICD-10-CM | POA: Insufficient documentation

## 2011-01-18 DIAGNOSIS — R110 Nausea: Secondary | ICD-10-CM | POA: Insufficient documentation

## 2011-01-18 DIAGNOSIS — Z8639 Personal history of other endocrine, nutritional and metabolic disease: Secondary | ICD-10-CM | POA: Insufficient documentation

## 2011-01-18 DIAGNOSIS — I252 Old myocardial infarction: Secondary | ICD-10-CM | POA: Insufficient documentation

## 2011-01-18 DIAGNOSIS — Z79899 Other long term (current) drug therapy: Secondary | ICD-10-CM | POA: Insufficient documentation

## 2011-01-18 DIAGNOSIS — E039 Hypothyroidism, unspecified: Secondary | ICD-10-CM | POA: Insufficient documentation

## 2011-01-18 DIAGNOSIS — Z8673 Personal history of transient ischemic attack (TIA), and cerebral infarction without residual deficits: Secondary | ICD-10-CM | POA: Insufficient documentation

## 2011-01-18 LAB — COMPREHENSIVE METABOLIC PANEL
ALT: 49 U/L (ref 0–53)
AST: 34 U/L (ref 0–37)
Albumin: 3.2 g/dL — ABNORMAL LOW (ref 3.5–5.2)
Alkaline Phosphatase: 89 U/L (ref 39–117)
BUN: 41 mg/dL — ABNORMAL HIGH (ref 6–23)
CO2: 25 mEq/L (ref 19–32)
Calcium: 9.8 mg/dL (ref 8.4–10.5)
Chloride: 102 mEq/L (ref 96–112)
Creatinine, Ser: 2.6 mg/dL — ABNORMAL HIGH (ref 0.50–1.35)
GFR calc Af Amer: 25 mL/min — ABNORMAL LOW (ref >90)
GFR calc non Af Amer: 22 mL/min — ABNORMAL LOW (ref >90)
Glucose, Bld: 81 mg/dL (ref 70–99)
Potassium: 4.2 mEq/L (ref 3.5–5.1)
Sodium: 136 mEq/L (ref 135–145)
Total Bilirubin: 0.4 mg/dL (ref 0.3–1.2)
Total Protein: 6.9 g/dL (ref 6.0–8.3)

## 2011-01-18 LAB — URINALYSIS, ROUTINE W REFLEX MICROSCOPIC
Bilirubin Urine: NEGATIVE
Glucose, UA: NEGATIVE mg/dL
Hgb urine dipstick: NEGATIVE
Ketones, ur: NEGATIVE mg/dL
Leukocytes, UA: NEGATIVE
Nitrite: NEGATIVE
Protein, ur: NEGATIVE mg/dL
Specific Gravity, Urine: 1.01 (ref 1.005–1.030)
Urobilinogen, UA: 0.2 mg/dL (ref 0.0–1.0)
pH: 6 (ref 5.0–8.0)

## 2011-01-18 LAB — LIPASE, BLOOD: Lipase: 72 U/L — ABNORMAL HIGH (ref 11–59)

## 2011-01-18 LAB — POCT I-STAT TROPONIN I: Troponin i, poc: 0.03 ng/mL (ref 0.00–0.08)

## 2011-01-18 LAB — CBC
HCT: 36.9 % — ABNORMAL LOW (ref 39.0–52.0)
Hemoglobin: 12.8 g/dL — ABNORMAL LOW (ref 13.0–17.0)
MCH: 27.9 pg (ref 26.0–34.0)
MCHC: 34.7 g/dL (ref 30.0–36.0)
MCV: 80.6 fL (ref 78.0–100.0)
Platelets: 152 10*3/uL (ref 150–400)
RBC: 4.58 MIL/uL (ref 4.22–5.81)
RDW: 15.2 % (ref 11.5–15.5)
WBC: 7.3 10*3/uL (ref 4.0–10.5)

## 2011-01-18 MED ORDER — TRAMADOL HCL 50 MG PO TABS: 50.0000 mg | ORAL_TABLET | Freq: Four times a day (QID) | ORAL | Status: DC | PRN

## 2011-01-18 NOTE — ED Notes (Signed)
Pt refuses to leave because he says he will be in pain.  Talked to pt.  Pt will leave if given pain prescription

## 2011-01-18 NOTE — ED Notes (Addendum)
Pt c/o abd pain and nausea x 3 months; reports pain worse following eating; states has been seen by GI specialist and "They cannot find anything."; denies v/d

## 2011-01-18 NOTE — ED Provider Notes (Signed)
History    81yM with abdominal pain. Ongoing for months. A little worse than normal today which is why presented to ED. Pain sometimes worse shortly after eating. Upper abdomen with no radiation. Achy and feels like there is something there. Nausea. No vomiting. No fever or chills. No diarrhea. No urinary complaints. Previous evaluations without clear etiology.  CSN: [id number]  Arrival date & time 01/18/11  7829   First MD Initiated Contact with Patient 01/18/11 [phone]      Chief Complaint  Patient presents with  . Abdominal Pain  . Nausea    (Consider location/radiation/quality/duration/timing/severity/associated sxs/prior treatment) HPI  Past Medical History  Diagnosis Date  . GERD (gastroesophageal reflux disease)   . Hypothyroidism   . Renal disorder   . Chronic back pain   . Hx of heart artery stent   . Myocardial infarction   . Stroke   . Arthritis   . Gout   . Pulmonary embolism, bilateral 2010  . Hyperlipemia     Past Surgical History  Procedure Date  . Appendectomy   . Cardiac catheterization   . Coronary angioplasty with stent placement   . Esophagogastroduodenoscopy 12/22/2010    Procedure: ESOPHAGOGASTRODUODENOSCOPY (EGD);  Surgeon: Theda Belfast;  Location: [facility];  Service: Endoscopy;  Laterality: N/A;    No family history on file.  History  Substance Use Topics  . Smoking status: Former Games developer  . Smokeless tobacco: Not on file  . Alcohol Use: No      Review of Systems   Review of symptoms negative unless otherwise noted in HPI.   Allergies  Review of patient's allergies indicates no known allergies.  Home Medications   Current Outpatient Rx  Name Route Sig Dispense Refill  . AMIODARONE HCL 200 MG PO TABS Oral Take 200 mg by mouth daily.     . ASPIRIN 81 MG PO TABS Oral Take 81 mg by mouth daily.      . BECLOMETHASONE DIPROPIONATE 80 MCG/ACT IN AERS Inhalation Inhale 1 puff into the lungs 2 (two) times daily.      Marland Kitchen CALCIUM  CARBONATE ANTACID 500 MG PO CHEW Oral Chew 1 tablet by mouth 3 (three) times daily as needed. For heart burn     . CARVEDILOL 6.25 MG PO TABS Oral Take 6.25 mg by mouth 2 (two) times daily with a meal.     . DEXLANSOPRAZOLE 60 MG PO CPDR Oral Take 60 mg by mouth daily.     Marland Kitchen ESOMEPRAZOLE MAGNESIUM 40 MG PO CPDR Oral Take 1 capsule (40 mg total) by mouth daily. 15 capsule 0  . FLUTICASONE PROPIONATE 50 MCG/ACT NA SUSP Nasal Place 2 sprays into the nose daily as needed. For stuffy nose and dryness    . FUROSEMIDE 40 MG PO TABS Oral Take 40 mg by mouth daily.     Marland Kitchen GLIMEPIRIDE 1 MG PO TABS Oral Take 1 mg by mouth daily before breakfast.      . ISOSORBIDE MONONITRATE ER 60 MG PO TB24 Oral Take 60 mg by mouth 2 (two) times daily.     Marland Kitchen KETOTIFEN FUMARATE 0.025 % OP SOLN Both Eyes Place 1 drop into both eyes 2 (two) times daily as needed. Dry eyes     . LEVOTHYROXINE SODIUM 50 MCG PO TABS Oral Take 50 mcg by mouth daily.     Marland Kitchen POLYETHYLENE GLYCOL 3350 PO PACK Oral Take 17 g by mouth daily as needed. For constipation    . POLYVINYL ALCOHOL 1.4 %  OP SOLN Both Eyes Place 1 drop into both eyes as needed. For dry eyes     . RANITIDINE HCL 300 MG PO CAPS Oral Take 300 mg by mouth every evening.      Marland Kitchen ROSUVASTATIN CALCIUM 10 MG PO TABS Oral Take 10 mg by mouth daily.     Bernadette Hoit SODIUM 8.6-50 MG PO TABS Oral Take 1 tablet by mouth daily. 30 tablet 0  . SPIRONOLACTONE 25 MG PO TABS Oral Take 25 mg by mouth daily.     . SUCRALFATE 1 GM/10ML PO SUSP Oral Take 10 mLs (1 g total) by mouth 4 (four) times daily. 420 mL 0  . WARFARIN SODIUM 4 MG PO TABS Oral Take 4 mg by mouth daily at 6 PM. Take daily except Friday.  Does not take on Friday    . TAMSULOSIN HCL 0.4 MG PO CAPS Oral Take 0.4 mg by mouth daily.        BP 143/71  Pulse 66  Temp(Src) 98 F (36.7 C) (Oral)  Resp 18  SpO2 100%  Physical Exam  Nursing note and vitals reviewed. Constitutional: He appears well-developed and  well-nourished. No distress.  HENT:  Head: Normocephalic and atraumatic.  Eyes: Conjunctivae are normal. Right eye exhibits no discharge. Left eye exhibits no discharge.  Neck: Neck supple.  Cardiovascular: Normal rate, regular rhythm and normal heart sounds.  Exam reveals no gallop and no friction rub.   No murmur heard. Pulmonary/Chest: Effort normal and breath sounds normal. No respiratory distress.  Abdominal: Soft. He exhibits no distension. There is tenderness.       Mild epigastric tenderness with rebound or guarding  Genitourinary:       No cva tenderness  Musculoskeletal: He exhibits no edema and no tenderness.  Neurological: He is alert.  Skin: Skin is warm and dry.  Psychiatric: He has a normal mood and affect. His behavior is normal. Thought content normal.    ED Course  Procedures (including critical care time)  Labs Reviewed  LIPASE, BLOOD - Abnormal; Notable for the following:    Lipase 72 (*)    All other components within normal limits  COMPREHENSIVE METABOLIC PANEL - Abnormal; Notable for the following:    BUN 41 (*)    Creatinine, Ser 2.60 (*)    Albumin 3.2 (*)    GFR calc non Af Amer 22 (*)    GFR calc Af Amer 25 (*)    All other components within normal limits  CBC - Abnormal; Notable for the following:    Hemoglobin 12.8 (*)    HCT 36.9 (*)    All other components within normal limits  URINALYSIS, ROUTINE W REFLEX MICROSCOPIC  POCT I-STAT TROPONIN I  LAB REPORT - SCANNED   US Abdomen Complete  01/23/2011  *RADIOLOGY REPORT*  Clinical Data:  Right upper quadrant pain.  COMPLETE ABDOMINAL ULTRASOUND  Comparison:  None.  Findings:  Gallbladder:  No gallstones, gallbladder wall thickening, or pericholecystic fluid.  Common bile duct:  Normal caliber, measuring about 4 mm diameter.  Liver:  Diffusely increased echotexture of the liver suggesting fatty infiltration.  No focal mass lesions identified.  IVC:  Appears normal.  Pancreas:  Not visualized due to  overlying bowel gas.  Spleen:  Spleen length measures 7.7 cm.  Homogeneous parenchymal echotexture.  Right Kidney:  Right kidney measures 11.3 cm length.  Parenchymal and parapelvic cysts are suggested, measuring up to about 6 cm maximal diameter.  No evidence of hydronephrosis.  Left Kidney:  Left kidney measures 11.7 cm length.  Multiple parenchymal cysts are demonstrated, largest measuring about 5.5 cm diameter.  No hydronephrosis.  Abdominal aorta:  No aneurysm identified.  IMPRESSION: Bilateral renal cysts.  Fatty infiltration of the liver. Gallbladder is unremarkable.  Original Report Authenticated By: Marlon Pel, M.D.    EKG:  Rhythm: normal sinus rhythm Rate: 60  Axis: normal Intervals: QTc 504. LBBB ST segments: changes consistent with LBBB.   1. Abdominal pain       MDM  81yM with abdominal pain. Etiology unclear. Consider PUD, gastritis, Atypical ACS, pancreatitis, biliary/renal colic, hiatal hernia, esophageal spasm, etc. Pain is chronic. Extensive w/u for same. Previous notes reviewed including recent EGD. Unremarkable aside from sliding hiatal hernia. Random biopsies taken and no concerning findings. Wu today unremarkable as well. Cr 2.6 but 2.7 and 2.5 on last previous 2 visits. Minimal elevation in lipase but doubt pancreatitis. Doubt obstruction. Do not feel imaging indicated at this time. Discussed with pt and family need to continue taking meds and follow-up with GI.    Raeford Razor, MD 01/23/11 2031

## 2011-01-21 ENCOUNTER — Encounter (HOSPITAL_COMMUNITY): Payer: Self-pay | Admitting: *Deleted

## 2011-01-21 ENCOUNTER — Emergency Department (HOSPITAL_COMMUNITY)
Admission: EM | Admit: 2011-01-21 | Discharge: 2011-01-21 | Disposition: A | Discharge status: Home / Self Care | Payer: Medicare Other | Attending: Emergency Medicine | Admitting: Emergency Medicine

## 2011-01-21 DIAGNOSIS — R109 Unspecified abdominal pain: Secondary | ICD-10-CM | POA: Insufficient documentation

## 2011-01-21 DIAGNOSIS — E039 Hypothyroidism, unspecified: Secondary | ICD-10-CM | POA: Insufficient documentation

## 2011-01-21 DIAGNOSIS — Z86711 Personal history of pulmonary embolism: Secondary | ICD-10-CM | POA: Insufficient documentation

## 2011-01-21 DIAGNOSIS — E785 Hyperlipidemia, unspecified: Secondary | ICD-10-CM | POA: Insufficient documentation

## 2011-01-21 DIAGNOSIS — K219 Gastro-esophageal reflux disease without esophagitis: Secondary | ICD-10-CM | POA: Insufficient documentation

## 2011-01-21 DIAGNOSIS — Z9861 Coronary angioplasty status: Secondary | ICD-10-CM | POA: Insufficient documentation

## 2011-01-21 DIAGNOSIS — G8929 Other chronic pain: Secondary | ICD-10-CM | POA: Insufficient documentation

## 2011-01-21 DIAGNOSIS — I10 Essential (primary) hypertension: Secondary | ICD-10-CM | POA: Insufficient documentation

## 2011-01-21 DIAGNOSIS — I252 Old myocardial infarction: Secondary | ICD-10-CM | POA: Insufficient documentation

## 2011-01-21 DIAGNOSIS — Z8679 Personal history of other diseases of the circulatory system: Secondary | ICD-10-CM | POA: Insufficient documentation

## 2011-01-21 DIAGNOSIS — M549 Dorsalgia, unspecified: Secondary | ICD-10-CM | POA: Insufficient documentation

## 2011-01-21 DIAGNOSIS — R110 Nausea: Secondary | ICD-10-CM | POA: Insufficient documentation

## 2011-01-21 LAB — PROTIME-INR
INR: 3.56 — ABNORMAL HIGH (ref 0.00–1.49)
Prothrombin Time: 36.1 seconds — ABNORMAL HIGH (ref 11.6–15.2)

## 2011-01-21 LAB — DIFFERENTIAL
Basophils Absolute: 0 10*3/uL (ref 0.0–0.1)
Eosinophils Relative: 2 % (ref 0–5)
Lymphocytes Relative: 22 % (ref 12–46)
Lymphs Abs: 1.5 10*3/uL (ref 0.7–4.0)
Monocytes Absolute: 1.1 10*3/uL — ABNORMAL HIGH (ref 0.1–1.0)
Monocytes Relative: 16 % — ABNORMAL HIGH (ref 3–12)

## 2011-01-21 LAB — COMPREHENSIVE METABOLIC PANEL
BUN: 31 mg/dL — ABNORMAL HIGH (ref 6–23)
CO2: 23 mEq/L (ref 19–32)
Calcium: 9.4 mg/dL (ref 8.4–10.5)
Chloride: 101 mEq/L (ref 96–112)
Creatinine, Ser: 2.25 mg/dL — ABNORMAL HIGH (ref 0.50–1.35)
GFR calc Af Amer: 30 mL/min — ABNORMAL LOW (ref >90)
GFR calc non Af Amer: 26 mL/min — ABNORMAL LOW (ref >90)
Glucose, Bld: 136 mg/dL — ABNORMAL HIGH (ref 70–99)
Total Bilirubin: 0.3 mg/dL (ref 0.3–1.2)

## 2011-01-21 LAB — CBC
HCT: 36.7 % — ABNORMAL LOW (ref 39.0–52.0)
Hemoglobin: 12.9 g/dL — ABNORMAL LOW (ref 13.0–17.0)
MCV: 80.5 fL (ref 78.0–100.0)
RDW: 14.9 % (ref 11.5–15.5)
WBC: 6.7 10*3/uL (ref 4.0–10.5)

## 2011-01-21 MED ORDER — PANTOPRAZOLE SODIUM 40 MG IV SOLR
40.0000 mg | Freq: Once | INTRAVENOUS | Status: AC
  Administered 2011-01-21: 40 mg via INTRAVENOUS
  Filled 2011-01-21: qty 40

## 2011-01-21 MED ORDER — SODIUM CHLORIDE 0.9 % IV SOLN
Freq: Once | INTRAVENOUS | Status: AC
  Administered 2011-01-21: 02:00:00 via INTRAVENOUS

## 2011-01-21 MED ORDER — METOCLOPRAMIDE HCL 5 MG/ML IJ SOLN
10.0000 mg | Freq: Once | INTRAMUSCULAR | Status: AC
  Administered 2011-01-21: 10 mg via INTRAVENOUS
  Filled 2011-01-21: qty 2

## 2011-01-21 MED ORDER — GI COCKTAIL ~~LOC~~
30.0000 mL | Freq: Once | ORAL | Status: AC
  Administered 2011-01-21: 30 mL via ORAL
  Filled 2011-01-21: qty 30

## 2011-01-21 NOTE — ED Provider Notes (Signed)
Date: 01/21/2011  Rate: 60  Rhythm: normal sinus rhythm  QRS Axis: normal  Intervals: normal  ST/T Wave abnormalities: nonspecific ST changes  Conduction Disutrbances:left bundle branch block  Narrative Interpretation:   Old EKG Reviewed: unchanged    Joya Gaskins, MD 01/21/11 [phone]

## 2011-01-21 NOTE — ED Notes (Signed)
Pt family member Jola Schmidt [phone] Granddaughter

## 2011-01-21 NOTE — ED Provider Notes (Signed)
History     CSN: [id number]  Arrival date & time 01/21/11  0028   First MD Initiated Contact with Patient 01/21/11 0102      Chief Complaint  Patient presents with  . Abdominal Pain     HPI  History provided by the patient. Patient is a 77 year old male with history of hypertension, hyperlipidemia, chronic renal disorder, GERD, and PE who presents with complaints of abdominal pain with some nausea. Patient reports having similar symptoms off and on for a long time. He reports being seen at [facility] long emergency room only a few days ago for similar complaints. Symptoms returned again yesterday and today. Patient denies to me any diarrhea or constipation. Patient has no episodes of vomiting. Patient denies any fever, chills, sweats.      Past Medical History  Diagnosis Date  . GERD (gastroesophageal reflux disease)   . Hypothyroidism   . Renal disorder   . Chronic back pain   . Hx of heart artery stent   . Myocardial infarction   . Stroke   . Arthritis   . Gout   . Pulmonary embolism, bilateral 2010  . Hyperlipemia     Past Surgical History  Procedure Date  . Appendectomy   . Cardiac catheterization   . Coronary angioplasty with stent placement   . Esophagogastroduodenoscopy 12/22/2010    Procedure: ESOPHAGOGASTRODUODENOSCOPY (EGD);  Surgeon: Theda Belfast;  Location: [facility];  Service: Endoscopy;  Laterality: N/A;    History reviewed. No pertinent family history.  History  Substance Use Topics  . Smoking status: Former Games developer  . Smokeless tobacco: Not on file  . Alcohol Use: No      Review of Systems  Constitutional: Negative for fever and chills.  Respiratory: Negative for cough and shortness of breath.   Cardiovascular: Negative for chest pain.  Gastrointestinal: Positive for nausea, abdominal pain and constipation. Negative for vomiting, blood in stool and rectal pain.  All other systems reviewed and are negative.    Allergies  Review of  patient's allergies indicates no known allergies.  Home Medications   Current Outpatient Rx  Name Route Sig Dispense Refill  . AMIODARONE HCL 200 MG PO TABS Oral Take 200 mg by mouth daily.     . ASPIRIN 81 MG PO TABS Oral Take 81 mg by mouth daily.      . BECLOMETHASONE DIPROPIONATE 80 MCG/ACT IN AERS Inhalation Inhale 1 puff into the lungs 2 (two) times daily as needed. For shortness of breath    . CARVEDILOL 6.25 MG PO TABS Oral Take 6.25 mg by mouth 2 (two) times daily with a meal.     . DEXLANSOPRAZOLE 60 MG PO CPDR Oral Take 60 mg by mouth daily.     Marland Kitchen FLUTICASONE PROPIONATE 50 MCG/ACT NA SUSP Nasal Place 2 sprays into the nose daily as needed. For stuffy nose and dryness    . FUROSEMIDE 40 MG PO TABS Oral Take 40 mg by mouth daily.     Marland Kitchen GLIMEPIRIDE 1 MG PO TABS Oral Take 1 mg by mouth daily before breakfast.      . ISOSORBIDE MONONITRATE ER 60 MG PO TB24 Oral Take 60 mg by mouth 2 (two) times daily.     Marland Kitchen LEVOTHYROXINE SODIUM 50 MCG PO TABS Oral Take 50 mcg by mouth daily.     Marland Kitchen POLYETHYLENE GLYCOL 3350 PO PACK Oral Take 17 g by mouth daily as needed. For constipation    . RANITIDINE HCL 300 MG  PO CAPS Oral Take 300 mg by mouth every evening.      Marland Kitchen ROSUVASTATIN CALCIUM 10 MG PO TABS Oral Take 10 mg by mouth daily.     Bernadette Hoit SODIUM 8.6-50 MG PO TABS Oral Take 1 tablet by mouth daily.      Marland Kitchen SPIRONOLACTONE 25 MG PO TABS Oral Take 25 mg by mouth daily.     . SUCRALFATE 1 GM/10ML PO SUSP Oral Take 2 g by mouth 4 (four) times daily.      Marland Kitchen TAMSULOSIN HCL 0.4 MG PO CAPS Oral Take 0.4 mg by mouth daily.      . TRAMADOL HCL 50 MG PO TABS Oral Take 50 mg by mouth every 6 (six) hours as needed. For pain Maximum dose= 8 tablets per day    . WARFARIN SODIUM 4 MG PO TABS Oral Take 4 mg by mouth daily at 6 PM. Take daily except Friday.  Does not take on Friday    . NITROGLYCERIN 0.4 MG SL SUBL Sublingual Place 0.4 mg under the tongue every 5 (five) minutes as needed. For chest  pain     . POLYVINYL ALCOHOL 1.4 % OP SOLN Both Eyes Place 1 drop into both eyes as needed. For dry eyes       BP 178/83  Pulse 58  Temp(Src) 97.8 F (36.6 C) (Oral)  Resp 18  SpO2 99%  Physical Exam  Nursing note and vitals reviewed. Constitutional: He is oriented to person, place, and time. He appears well-developed and well-nourished.  HENT:  Head: Normocephalic.  Mouth/Throat: Oropharynx is clear and moist.  Cardiovascular: Normal rate and regular rhythm.   Pulmonary/Chest: Effort normal and breath sounds normal. No respiratory distress. He has no wheezes.  Abdominal: Soft. He exhibits no mass. There is no rebound and no guarding.       Mild distention with mild diffuse tenderness. No pulsatile masses.  Genitourinary: Penis normal.       No impaction  Neurological: He is alert and oriented to person, place, and time.  Skin: Skin is warm. No rash noted. No erythema.  Psychiatric: He has a normal mood and affect. His behavior is normal.    ED Course  Procedures (including critical care time)   Labs Reviewed  OCCULT BLOOD, POC DEVICE  POCT OCCULT BLOOD STOOL, DEVICE  CBC  DIFFERENTIAL  COMPREHENSIVE METABOLIC PANEL  LIPASE, BLOOD  PROTIME-INR    Results for orders placed during the hospital encounter of 01/21/11  OCCULT BLOOD, POC DEVICE      Component Value Range   Fecal Occult Bld NEGATIVE    CBC      Component Value Range   WBC 6.7  4.0 - 10.5 (K/uL)   RBC 4.56  4.22 - 5.81 (MIL/uL)   Hemoglobin 12.9 (*) 13.0 - 17.0 (g/dL)   HCT 91.4 (*) 78.2 - 52.0 (%)   MCV 80.5  78.0 - 100.0 (fL)   MCH 28.3  26.0 - 34.0 (pg)   MCHC 35.1  30.0 - 36.0 (g/dL)   RDW 95.6  21.3 - 08.6 (%)   Platelets 152  150 - 400 (K/uL)  DIFFERENTIAL      Component Value Range   Neutrophils Relative 61  43 - 77 (%)   Neutro Abs 4.1  1.7 - 7.7 (K/uL)   Lymphocytes Relative 22  12 - 46 (%)   Lymphs Abs 1.5  0.7 - 4.0 (K/uL)   Monocytes Relative 16 (*) 3 - 12 (%)  Monocytes Absolute  1.1 (*) 0.1 - 1.0 (K/uL)   Eosinophils Relative 2  0 - 5 (%)   Eosinophils Absolute 0.1  0.0 - 0.7 (K/uL)   Basophils Relative 0  0 - 1 (%)   Basophils Absolute 0.0  0.0 - 0.1 (K/uL)  COMPREHENSIVE METABOLIC PANEL      Component Value Range   Sodium 131 (*) 135 - 145 (mEq/L)   Potassium 4.6  3.5 - 5.1 (mEq/L)   Chloride 101  96 - 112 (mEq/L)   CO2 23  19 - 32 (mEq/L)   Glucose, Bld 136 (*) 70 - 99 (mg/dL)   BUN 31 (*) 6 - 23 (mg/dL)   Creatinine, Ser 4.09 (*) 0.50 - 1.35 (mg/dL)   Calcium 9.4  8.4 - 81.1 (mg/dL)   Total Protein 6.9  6.0 - 8.3 (g/dL)   Albumin 3.3 (*) 3.5 - 5.2 (g/dL)   AST 31  0 - 37 (U/L)   ALT 45  0 - 53 (U/L)   Alkaline Phosphatase 86  39 - 117 (U/L)   Total Bilirubin 0.3  0.3 - 1.2 (mg/dL)   GFR calc non Af Amer 26 (*) >90 (mL/min)   GFR calc Af Amer 30 (*) >90 (mL/min)  LIPASE, BLOOD      Component Value Range   Lipase 62 (*) 11 - 59 (U/L)  PROTIME-INR      Component Value Range   Prothrombin Time 36.1 (*) 11.6 - 15.2 (seconds)   INR 3.56 (*) 0.00 - 1.49        1. Abdominal pain       MDM  1:08AM Pt seen and evaluated. Pt in no acute distress.   2:30 AM patient up to the bathroom walking well without assistance. Patient had a bowel movement. Patient reports that his abdominal pains have improved. Patient is have a soft abdomen.  Review of patient's recent visits to the ED show a normal acute abdomen x-ray 3 days ago. he also had a normal CT scan one month ago. Patient's labs today are at his baseline.  Patient also seen and discussed with attending physician. Patient continues to be asymptomatic at this time with soft abdomen. he has had recent imaging at this time is felt there is any cause for additional imaging.    Date: 01/21/2011  Rate: 56  Rhythm: unchanged  QRS Axis: normal  Intervals: normal  ST/T Wave abnormalities: nonspecific ST/T changes  Conduction Disutrbances:left bundle branch block  Narrative Interpretation:   Old EKG  Reviewed: unchanged from 01/18/2011    Angus Seller, PA 01/21/11 [phone]

## 2011-01-21 NOTE — ED Notes (Signed)
The pt has had abd pain for awhile with nuasea and diarrhea.  He has been here for the same and the [place] ed

## 2011-01-21 NOTE — ED Notes (Signed)
NAD, resp e/u AOx4. Pt states understanding of discharge instructions and denies questions at time of discharge.

## 2011-01-21 NOTE — ED Provider Notes (Signed)
Medical screening examination/treatment/procedure(s) were conducted as a shared visit with non-physician practitioner(s) and myself.  I personally evaluated the patient during the encounter  Pt well appearing, he admits he has had this chronically and has GI followup  Joya Gaskins, MD 01/21/11 [phone]

## 2011-01-21 NOTE — ED Notes (Signed)
EDP made aware pt pain is not improved after pt bowel movement. EDP at bedside.

## 2011-01-23 ENCOUNTER — Observation Stay (HOSPITAL_COMMUNITY)
Admission: EM | Admit: 2011-01-23 | Discharge: 2011-01-23 | Disposition: A | Discharge status: Home / Self Care | Payer: Medicare Other | Attending: Emergency Medicine | Admitting: Emergency Medicine

## 2011-01-23 ENCOUNTER — Other Ambulatory Visit: Payer: Self-pay

## 2011-01-23 ENCOUNTER — Observation Stay (HOSPITAL_COMMUNITY): Payer: Medicare Other

## 2011-01-23 ENCOUNTER — Encounter (HOSPITAL_COMMUNITY): Payer: Self-pay | Admitting: Emergency Medicine

## 2011-01-23 DIAGNOSIS — I359 Nonrheumatic aortic valve disorder, unspecified: Secondary | ICD-10-CM | POA: Insufficient documentation

## 2011-01-23 DIAGNOSIS — M549 Dorsalgia, unspecified: Secondary | ICD-10-CM | POA: Insufficient documentation

## 2011-01-23 DIAGNOSIS — E785 Hyperlipidemia, unspecified: Secondary | ICD-10-CM | POA: Insufficient documentation

## 2011-01-23 DIAGNOSIS — R112 Nausea with vomiting, unspecified: Secondary | ICD-10-CM | POA: Insufficient documentation

## 2011-01-23 DIAGNOSIS — G8929 Other chronic pain: Secondary | ICD-10-CM | POA: Insufficient documentation

## 2011-01-23 DIAGNOSIS — K219 Gastro-esophageal reflux disease without esophagitis: Secondary | ICD-10-CM | POA: Insufficient documentation

## 2011-01-23 DIAGNOSIS — Q618 Other cystic kidney diseases: Secondary | ICD-10-CM | POA: Insufficient documentation

## 2011-01-23 DIAGNOSIS — I252 Old myocardial infarction: Secondary | ICD-10-CM | POA: Insufficient documentation

## 2011-01-23 DIAGNOSIS — R1011 Right upper quadrant pain: Principal | ICD-10-CM | POA: Insufficient documentation

## 2011-01-23 DIAGNOSIS — I447 Left bundle-branch block, unspecified: Secondary | ICD-10-CM | POA: Insufficient documentation

## 2011-01-23 LAB — CBC
HCT: 35.6 % — ABNORMAL LOW (ref 39.0–52.0)
MCH: 28.2 pg (ref 26.0–34.0)
MCV: 81.1 fL (ref 78.0–100.0)
Platelets: 139 10*3/uL — ABNORMAL LOW (ref 150–400)
RDW: 14.8 % (ref 11.5–15.5)

## 2011-01-23 LAB — POCT I-STAT, CHEM 8
BUN: 30 mg/dL — ABNORMAL HIGH (ref 6–23)
Creatinine, Ser: 2.1 mg/dL — ABNORMAL HIGH (ref 0.50–1.35)
Glucose, Bld: 197 mg/dL — ABNORMAL HIGH (ref 70–99)
Hemoglobin: 13.3 g/dL (ref 13.0–17.0)
Potassium: 4.6 mEq/L (ref 3.5–5.1)
TCO2: 25 mmol/L (ref 0–100)

## 2011-01-23 LAB — COMPREHENSIVE METABOLIC PANEL
AST: 29 U/L (ref 0–37)
Albumin: 3 g/dL — ABNORMAL LOW (ref 3.5–5.2)
BUN: 29 mg/dL — ABNORMAL HIGH (ref 6–23)
CO2: 22 mEq/L (ref 19–32)
Calcium: 9 mg/dL (ref 8.4–10.5)
Chloride: 98 mEq/L (ref 96–112)
Creatinine, Ser: 2.2 mg/dL — ABNORMAL HIGH (ref 0.50–1.35)
GFR calc non Af Amer: 27 mL/min — ABNORMAL LOW (ref >90)
Total Bilirubin: 0.3 mg/dL (ref 0.3–1.2)

## 2011-01-23 LAB — POCT I-STAT TROPONIN I

## 2011-01-23 LAB — URINALYSIS, ROUTINE W REFLEX MICROSCOPIC
Bilirubin Urine: NEGATIVE
Glucose, UA: NEGATIVE mg/dL
Hgb urine dipstick: NEGATIVE
Protein, ur: NEGATIVE mg/dL
Urobilinogen, UA: 0.2 mg/dL (ref 0.0–1.0)

## 2011-01-23 LAB — LIPASE, BLOOD: Lipase: 59 U/L (ref 11–59)

## 2011-01-23 MED ORDER — ONDANSETRON HCL 4 MG/2ML IJ SOLN
4.0000 mg | Freq: Four times a day (QID) | INTRAMUSCULAR | Status: DC | PRN
  Filled 2011-01-23: qty 2

## 2011-01-23 MED ORDER — MORPHINE SULFATE 2 MG/ML IJ SOLN: 2.0000 mg | INTRAMUSCULAR | Status: DC | PRN

## 2011-01-23 MED ORDER — FENTANYL CITRATE 0.05 MG/ML IJ SOLN
25.0000 ug | Freq: Once | INTRAMUSCULAR | Status: AC
  Administered 2011-01-23: 25 ug via INTRAVENOUS
  Filled 2011-01-23: qty 2

## 2011-01-23 MED ORDER — FENTANYL CITRATE 0.05 MG/ML IJ SOLN
25.0000 ug | Freq: Once | INTRAMUSCULAR | Status: AC
  Administered 2011-01-23: 25 ug via INTRAVENOUS

## 2011-01-23 MED ORDER — MAGNESIUM HYDROXIDE 400 MG/5ML PO SUSP: 30.0000 mL | Freq: Two times a day (BID) | ORAL | Status: DC | PRN

## 2011-01-23 MED ORDER — ZOLPIDEM TARTRATE 5 MG PO TABS: 5.0000 mg | ORAL_TABLET | Freq: Every evening | ORAL | Status: DC | PRN

## 2011-01-23 MED ORDER — ACETAMINOPHEN 325 MG PO TABS: 650.0000 mg | ORAL_TABLET | ORAL | Status: DC | PRN

## 2011-01-23 MED ORDER — SODIUM CHLORIDE 0.9 % IV SOLN: INTRAVENOUS | Status: DC

## 2011-01-23 MED ORDER — FAMOTIDINE IN NACL 20-0.9 MG/50ML-% IV SOLN
20.0000 mg | Freq: Two times a day (BID) | INTRAVENOUS | Status: DC
  Administered 2011-01-23: 20 mg via INTRAVENOUS
  Filled 2011-01-23: qty 50

## 2011-01-23 MED ORDER — PANTOPRAZOLE SODIUM 40 MG IV SOLR
40.0000 mg | Freq: Once | INTRAVENOUS | Status: AC
  Administered 2011-01-23: 40 mg via INTRAVENOUS
  Filled 2011-01-23: qty 40

## 2011-01-23 MED ORDER — GI COCKTAIL ~~LOC~~: 30.0000 mL | ORAL | Status: DC | PRN

## 2011-01-23 MED ORDER — ONDANSETRON HCL 4 MG/2ML IJ SOLN
4.0000 mg | Freq: Once | INTRAMUSCULAR | Status: AC
  Administered 2011-01-23: 4 mg via INTRAVENOUS

## 2011-01-23 MED ORDER — ONDANSETRON HCL 4 MG PO TABS: 8.0000 mg | ORAL_TABLET | Freq: Four times a day (QID) | ORAL | Status: DC

## 2011-01-23 MED ORDER — SODIUM CHLORIDE 0.9 % IV BOLUS (SEPSIS)
1000.0000 mL | Freq: Once | INTRAVENOUS | Status: AC
  Administered 2011-01-23: 1000 mL via INTRAVENOUS

## 2011-01-23 NOTE — ED Notes (Signed)
Pt is awaiting granddaughter for ride home

## 2011-01-23 NOTE — ED Notes (Signed)
Pt has many questions about discharge, edp aware and to bedside to discuss discharge and f/u with pt

## 2011-01-23 NOTE — ED Notes (Signed)
Pt sleeping, NAD at this time.  Remains on cardiac and O2 monitors.

## 2011-01-23 NOTE — ED Notes (Signed)
Pt sleeping.  Spoke with Dr. Dierdre Highman who states he will reevaluate pt shortly.

## 2011-01-23 NOTE — ED Provider Notes (Signed)
History     CSN: [id number]  Arrival date & time 01/23/11  9563   First MD Initiated Contact with Patient 01/23/11 0111      Chief Complaint  Patient presents with  . Abdominal Pain    abdominal pain for while, patient stated he has been checked several times in ER for the same and they seem to find nothing. patient stated pain gets worse after eating.     (Consider location/radiation/quality/duration/timing/severity/associated sxs/prior treatment) Patient is a 78 y.o. male presenting with abdominal pain. The history is provided by the patient.  Abdominal Pain The primary symptoms of the illness include abdominal pain, nausea and vomiting. The primary symptoms of the illness do not include fever, shortness of breath or dysuria. Episode onset: On and off since April. The onset of the illness was gradual. The problem has been gradually worsening.  Associated with: Anything that he eats. The patient has not had a change in bowel habit. Additional symptoms associated with the illness include heartburn. Symptoms associated with the illness do not include chills, diaphoresis or back pain. Significant associated medical issues include GERD.   patient with multiple medical problems including persistent reflux symptoms. He's been evaluated 3 times in the last week for the same symptoms. He states that anytime he needs something about 30 minutes later he developed nausea and vomiting and unable to tolerate anything that point. He is taking medications as prescribed including Zantac, which he tells me does not help. He has had recent upper endoscopy that he reports to me was normal. He's had a recent CT scan that he also reports was normal. No fevers or chills. No chest pain or shortness of breath. Pain is sharp in quality nonradiating although he does feel some reflux symptoms in his midline upper sternal area. No known alleviating factors. Moderate in severity. No associated diarrhea  Past Medical  History  Diagnosis Date  . GERD (gastroesophageal reflux disease)   . Hypothyroidism   . Renal disorder   . Chronic back pain   . Hx of heart artery stent   . Myocardial infarction   . Stroke   . Arthritis   . Gout   . Pulmonary embolism, bilateral 2010  . Hyperlipemia     Past Surgical History  Procedure Date  . Appendectomy   . Cardiac catheterization   . Coronary angioplasty with stent placement   . Esophagogastroduodenoscopy 12/22/2010    Procedure: ESOPHAGOGASTRODUODENOSCOPY (EGD);  Surgeon: Theda Belfast;  Location: [facility];  Service: Endoscopy;  Laterality: N/A;    History reviewed. No pertinent family history.  History  Substance Use Topics  . Smoking status: Former Games developer  . Smokeless tobacco: Not on file  . Alcohol Use: No      Review of Systems  Constitutional: Negative for fever, chills and diaphoresis.  HENT: Negative for neck pain and neck stiffness.   Eyes: Negative for pain.  Respiratory: Negative for shortness of breath.   Cardiovascular: Negative for leg swelling.  Gastrointestinal: Positive for heartburn, nausea, vomiting and abdominal pain.  Genitourinary: Negative for dysuria.  Musculoskeletal: Negative for back pain.  Skin: Negative for rash.  Neurological: Negative for headaches.  All other systems reviewed and are negative.    Allergies  Review of patient's allergies indicates no known allergies.  Home Medications   Current Outpatient Rx  Name Route Sig Dispense Refill  . AMIODARONE HCL 200 MG PO TABS Oral Take 200 mg by mouth daily.     . ASPIRIN  81 MG PO TABS Oral Take 81 mg by mouth daily.      . BECLOMETHASONE DIPROPIONATE 80 MCG/ACT IN AERS Inhalation Inhale 1 puff into the lungs 2 (two) times daily as needed. For shortness of breath    . CARVEDILOL 6.25 MG PO TABS Oral Take 6.25 mg by mouth 2 (two) times daily with a meal.     . DEXLANSOPRAZOLE 60 MG PO CPDR Oral Take 60 mg by mouth daily.     Marland Kitchen FLUTICASONE PROPIONATE  50 MCG/ACT NA SUSP Nasal Place 2 sprays into the nose daily as needed. For stuffy nose and dryness    . FUROSEMIDE 40 MG PO TABS Oral Take 40 mg by mouth daily.     Marland Kitchen GLIMEPIRIDE 1 MG PO TABS Oral Take 1 mg by mouth daily before breakfast.      . ISOSORBIDE MONONITRATE ER 60 MG PO TB24 Oral Take 60 mg by mouth 2 (two) times daily.     Marland Kitchen LEVOTHYROXINE SODIUM 50 MCG PO TABS Oral Take 50 mcg by mouth daily.     Marland Kitchen NITROGLYCERIN 0.4 MG SL SUBL Sublingual Place 0.4 mg under the tongue every 5 (five) minutes as needed. For chest pain     . POLYETHYLENE GLYCOL 3350 PO PACK Oral Take 17 g by mouth daily as needed. For constipation    . POLYVINYL ALCOHOL 1.4 % OP SOLN Both Eyes Place 1 drop into both eyes as needed. For dry eyes     . RANITIDINE HCL 300 MG PO CAPS Oral Take 300 mg by mouth every evening.      Marland Kitchen ROSUVASTATIN CALCIUM 10 MG PO TABS Oral Take 10 mg by mouth daily.     Bernadette Hoit SODIUM 8.6-50 MG PO TABS Oral Take 1 tablet by mouth daily.      Marland Kitchen SPIRONOLACTONE 25 MG PO TABS Oral Take 25 mg by mouth daily.     . SUCRALFATE 1 GM/10ML PO SUSP Oral Take 2 g by mouth 4 (four) times daily.      Marland Kitchen TAMSULOSIN HCL 0.4 MG PO CAPS Oral Take 0.4 mg by mouth daily.      . TRAMADOL HCL 50 MG PO TABS Oral Take 50 mg by mouth every 6 (six) hours as needed. For pain Maximum dose= 8 tablets per day    . WARFARIN SODIUM 4 MG PO TABS Oral Take 4 mg by mouth daily at 6 PM. Take daily except Friday.  Does not take on Friday      BP 147/64  Pulse 66  Temp(Src) 97.3 F (36.3 C) (Oral)  Resp 20  SpO2 97%  Physical Exam  Constitutional: He is oriented to person, place, and time. He appears well-developed and well-nourished.  HENT:  Head: Normocephalic and atraumatic.  Eyes: Conjunctivae and EOM are normal. Pupils are equal, round, and reactive to light.  Neck: Trachea normal. Neck supple. No thyromegaly present.  Cardiovascular: Normal rate, regular rhythm, S1 normal, S2 normal and normal pulses.       No systolic murmur is present   No diastolic murmur is present  Pulses:      Radial pulses are 2+ on the right side, and 2+ on the left side.  Pulmonary/Chest: Effort normal and breath sounds normal. He has no wheezes. He has no rhonchi. He has no rales. He exhibits no tenderness.  Abdominal: Soft. Normal appearance and bowel sounds are normal. There is no rebound, no guarding, no CVA tenderness and negative Murphy's sign.  Mild epigastric discomfort. No peritonitis  Musculoskeletal:       BLE:s Calves nontender, no cords or erythema, negative Homans sign  Neurological: He is alert and oriented to person, place, and time. He has normal strength. No cranial nerve deficit or sensory deficit. GCS eye subscore is 4. GCS verbal subscore is 5. GCS motor subscore is 6.  Skin: Skin is warm and dry. No rash noted. He is not diaphoretic.  Psychiatric: His speech is normal.       Cooperative and appropriate    ED Course  Procedures (including critical care time)  Results for orders placed during the hospital encounter of 01/23/11  PROTIME-INR      Component Value Range   Prothrombin Time 42.8 (*) 11.6 - 15.2 (seconds)   INR 4.42 (*) 0.00 - 1.49   CBC      Component Value Range   WBC 7.2  4.0 - 10.5 (K/uL)   RBC 4.39  4.22 - 5.81 (MIL/uL)   Hemoglobin 12.4 (*) 13.0 - 17.0 (g/dL)   HCT 16.1 (*) 09.6 - 52.0 (%)   MCV 81.1  78.0 - 100.0 (fL)   MCH 28.2  26.0 - 34.0 (pg)   MCHC 34.8  30.0 - 36.0 (g/dL)   RDW 04.5  40.9 - 81.1 (%)   Platelets 139 (*) 150 - 400 (K/uL)  COMPREHENSIVE METABOLIC PANEL      Component Value Range   Sodium 130 (*) 135 - 145 (mEq/L)   Potassium 4.5  3.5 - 5.1 (mEq/L)   Chloride 98  96 - 112 (mEq/L)   CO2 22  19 - 32 (mEq/L)   Glucose, Bld 192 (*) 70 - 99 (mg/dL)   BUN 29 (*) 6 - 23 (mg/dL)   Creatinine, Ser 9.14 (*) 0.50 - 1.35 (mg/dL)   Calcium 9.0  8.4 - 78.2 (mg/dL)   Total Protein 6.6  6.0 - 8.3 (g/dL)   Albumin 3.0 (*) 3.5 - 5.2 (g/dL)   AST 29  0  - 37 (U/L)   ALT 43  0 - 53 (U/L)   Alkaline Phosphatase 89  39 - 117 (U/L)   Total Bilirubin 0.3  0.3 - 1.2 (mg/dL)   GFR calc non Af Amer 27 (*) >90 (mL/min)   GFR calc Af Amer 31 (*) >90 (mL/min)  LIPASE, BLOOD      Component Value Range   Lipase 59  11 - 59 (U/L)  URINALYSIS, ROUTINE W REFLEX MICROSCOPIC      Component Value Range   Color, Urine YELLOW  YELLOW    APPearance CLEAR  CLEAR    Specific Gravity, Urine 1.009  1.005 - 1.030    pH 5.5  5.0 - 8.0    Glucose, UA NEGATIVE  NEGATIVE (mg/dL)   Hgb urine dipstick NEGATIVE  NEGATIVE    Bilirubin Urine NEGATIVE  NEGATIVE    Ketones, ur NEGATIVE  NEGATIVE (mg/dL)   Protein, ur NEGATIVE  NEGATIVE (mg/dL)   Urobilinogen, UA 0.2  0.0 - 1.0 (mg/dL)   Nitrite NEGATIVE  NEGATIVE    Leukocytes, UA NEGATIVE  NEGATIVE   POCT I-STAT, CHEM 8      Component Value Range   Sodium 134 (*) 135 - 145 (mEq/L)   Potassium 4.6  3.5 - 5.1 (mEq/L)   Chloride 102  96 - 112 (mEq/L)   BUN 30 (*) 6 - 23 (mg/dL)   Creatinine, Ser 9.56 (*) 0.50 - 1.35 (mg/dL)   Glucose, Bld 213 (*) 70 - 99 (  mg/dL)   Calcium, Ion 4.78  2.95 - 1.32 (mmol/L)   TCO2 25  0 - 100 (mmol/L)   Hemoglobin 13.3  13.0 - 17.0 (g/dL)   HCT 62.1  30.8 - 65.7 (%)  POCT I-STAT TROPONIN I      Component Value Range   Troponin i, poc 0.02  0.00 - 0.08 (ng/mL)   Comment 3            US Abdomen Complete  01/23/2011  *RADIOLOGY REPORT*  Clinical Data:  Right upper quadrant pain.  COMPLETE ABDOMINAL ULTRASOUND  Comparison:  None.  Findings:  Gallbladder:  No gallstones, gallbladder wall thickening, or pericholecystic fluid.  Common bile duct:  Normal caliber, measuring about 4 mm diameter.  Liver:  Diffusely increased echotexture of the liver suggesting fatty infiltration.  No focal mass lesions identified.  IVC:  Appears normal.  Pancreas:  Not visualized due to overlying bowel gas.  Spleen:  Spleen length measures 7.7 cm.  Homogeneous parenchymal echotexture.  Right Kidney:  Right kidney  measures 11.3 cm length.  Parenchymal and parapelvic cysts are suggested, measuring up to about 6 cm maximal diameter.  No evidence of hydronephrosis.  Left Kidney:  Left kidney measures 11.7 cm length.  Multiple parenchymal cysts are demonstrated, largest measuring about 5.5 cm diameter.  No hydronephrosis.  Abdominal aorta:  No aneurysm identified.    IMPRESSION: Bilateral renal cysts.  Fatty infiltration of the liver. Gallbladder is unremarkable.  Original Report Authenticated By: Marlon Pel, M.D.   Dg Abd Acute W/chest  01/11/2011  *RADIOLOGY REPORT*  Clinical Data: Nausea, abdominal pain.  ACUTE ABDOMEN SERIES (ABDOMEN 2 VIEW & CHEST 1 VIEW)  Comparison: 12/13/2010 CT  Findings: Heart size upper normal limits to mildly enlarged.  Mild chronic interstitial prominence without focal consolidation.  No pleural effusion or pneumothorax.  Aortic arch atherosclerosis.  Curvature of the thoracolumbar spine.  Osteopenia.  Organ outlines normal where seen.  No free intraperitoneal air identified.  Nonobstructive bowel gas pattern.  IMPRESSION: Nonobstructive bowel gas pattern.  Original Report Authenticated By: Waneta Martins, M.D.     Rate: 65  Rhythm: normal sinus rhythm  QRS Axis: normal  Intervals: normal  ST/T Wave abnormalities: nonspecific ST/T changes  Conduction Disutrbances:left bundle branch block  Narrative Interpretation:   Old EKG Reviewed: unchanged    MDM  Persistent reflux symptoms and now with nausea and vomiting. Patient placed on ED observation protocol for fluid hydration and IV medications. Ultrasound abdomen is ordered to evaluate gallbladder.  Recheck at 4:34 AM. Patient is resting comfortably. His gallbladder ultrasound was reviewed as above. Labs reviewed. Baseline creatinine. Elevated INR without any active bleeding. Plan hold next dose.  8:36 AM case discussed as above with Dr. Sharyn Lull who knows patient very well and he recommends discharge home and  followup this week in clinic to recheck his INR. He agrees with plan hold next dose. Patient agreeable to plan and will otherwise take medications as prescribed.     Sunnie Nielsen, MD 01/23/11 [phone]

## 2011-01-23 NOTE — ED Notes (Signed)
Pt sleeping in bed.  Remains on cardiac monitor.  NAD at this time.

## 2011-01-27 ENCOUNTER — Encounter (HOSPITAL_COMMUNITY): Payer: Self-pay | Admitting: *Deleted

## 2011-01-27 ENCOUNTER — Emergency Department (HOSPITAL_COMMUNITY)
Admission: EM | Admit: 2011-01-27 | Discharge: 2011-01-27 | Disposition: A | Discharge status: Home / Self Care | Payer: Medicare Other | Attending: Emergency Medicine | Admitting: Emergency Medicine

## 2011-01-27 ENCOUNTER — Emergency Department (HOSPITAL_COMMUNITY): Payer: Medicare Other

## 2011-01-27 ENCOUNTER — Other Ambulatory Visit: Payer: Self-pay

## 2011-01-27 DIAGNOSIS — Z8679 Personal history of other diseases of the circulatory system: Secondary | ICD-10-CM | POA: Insufficient documentation

## 2011-01-27 DIAGNOSIS — R109 Unspecified abdominal pain: Secondary | ICD-10-CM

## 2011-01-27 DIAGNOSIS — I76 Septic arterial embolism: Secondary | ICD-10-CM | POA: Insufficient documentation

## 2011-01-27 DIAGNOSIS — Z86711 Personal history of pulmonary embolism: Secondary | ICD-10-CM | POA: Insufficient documentation

## 2011-01-27 DIAGNOSIS — R0602 Shortness of breath: Secondary | ICD-10-CM | POA: Insufficient documentation

## 2011-01-27 DIAGNOSIS — I10 Essential (primary) hypertension: Secondary | ICD-10-CM | POA: Insufficient documentation

## 2011-01-27 DIAGNOSIS — R61 Generalized hyperhidrosis: Secondary | ICD-10-CM | POA: Insufficient documentation

## 2011-01-27 DIAGNOSIS — R079 Chest pain, unspecified: Secondary | ICD-10-CM | POA: Insufficient documentation

## 2011-01-27 DIAGNOSIS — I252 Old myocardial infarction: Secondary | ICD-10-CM | POA: Insufficient documentation

## 2011-01-27 DIAGNOSIS — R1013 Epigastric pain: Secondary | ICD-10-CM | POA: Insufficient documentation

## 2011-01-27 DIAGNOSIS — R110 Nausea: Secondary | ICD-10-CM | POA: Insufficient documentation

## 2011-01-27 DIAGNOSIS — K219 Gastro-esophageal reflux disease without esophagitis: Secondary | ICD-10-CM | POA: Insufficient documentation

## 2011-01-27 DIAGNOSIS — G8929 Other chronic pain: Secondary | ICD-10-CM | POA: Insufficient documentation

## 2011-01-27 DIAGNOSIS — E785 Hyperlipidemia, unspecified: Secondary | ICD-10-CM | POA: Insufficient documentation

## 2011-01-27 DIAGNOSIS — M25519 Pain in unspecified shoulder: Secondary | ICD-10-CM | POA: Insufficient documentation

## 2011-01-27 DIAGNOSIS — I2510 Atherosclerotic heart disease of native coronary artery without angina pectoris: Secondary | ICD-10-CM | POA: Insufficient documentation

## 2011-01-27 DIAGNOSIS — K224 Dyskinesia of esophagus: Secondary | ICD-10-CM | POA: Insufficient documentation

## 2011-01-27 DIAGNOSIS — M549 Dorsalgia, unspecified: Secondary | ICD-10-CM | POA: Insufficient documentation

## 2011-01-27 LAB — COMPREHENSIVE METABOLIC PANEL
ALT: 44 U/L (ref 0–53)
AST: 37 U/L (ref 0–37)
Albumin: 3.3 g/dL — ABNORMAL LOW (ref 3.5–5.2)
Alkaline Phosphatase: 90 U/L (ref 39–117)
BUN: 26 mg/dL — ABNORMAL HIGH (ref 6–23)
Chloride: 100 mEq/L (ref 96–112)
Potassium: 4.4 mEq/L (ref 3.5–5.1)
Sodium: 135 mEq/L (ref 135–145)
Total Bilirubin: 0.4 mg/dL (ref 0.3–1.2)
Total Protein: 7.4 g/dL (ref 6.0–8.3)

## 2011-01-27 LAB — PROTIME-INR
INR: 2.76 — ABNORMAL HIGH (ref 0.00–1.49)
Prothrombin Time: 29.6 seconds — ABNORMAL HIGH (ref 11.6–15.2)

## 2011-01-27 LAB — DIFFERENTIAL
Basophils Relative: 0 % (ref 0–1)
Eosinophils Absolute: 0.1 10*3/uL (ref 0.0–0.7)
Monocytes Relative: 12 % (ref 3–12)
Neutro Abs: 4.3 10*3/uL (ref 1.7–7.7)
Neutrophils Relative %: 56 % (ref 43–77)

## 2011-01-27 LAB — CARDIAC PANEL(CRET KIN+CKTOT+MB+TROPI)
CK, MB: 2.4 ng/mL (ref 0.3–4.0)
Relative Index: INVALID (ref 0.0–2.5)
Troponin I: <0.3 ng/mL (ref <0.30)

## 2011-01-27 LAB — APTT: aPTT: 35 seconds (ref 24–37)

## 2011-01-27 LAB — CBC
Hemoglobin: 13.5 g/dL (ref 13.0–17.0)
MCH: 28.7 pg (ref 26.0–34.0)
MCHC: 35.2 g/dL (ref 30.0–36.0)
Platelets: 165 10*3/uL (ref 150–400)
RBC: 4.71 MIL/uL (ref 4.22–5.81)

## 2011-01-27 LAB — POCT I-STAT TROPONIN I: Troponin i, poc: 0.03 ng/mL (ref 0.00–0.08)

## 2011-01-27 MED ORDER — ASPIRIN 81 MG PO CHEW
324.0000 mg | CHEWABLE_TABLET | Freq: Once | ORAL | Status: AC
  Administered 2011-01-27: 324 mg via ORAL
  Filled 2011-01-27: qty 1

## 2011-01-27 MED ORDER — HYDROCODONE-ACETAMINOPHEN 5-325 MG PO TABS
1.0000 | ORAL_TABLET | Freq: Once | ORAL | Status: AC
  Administered 2011-01-27: 1 via ORAL
  Filled 2011-01-27: qty 1

## 2011-01-27 MED ORDER — NITROGLYCERIN 0.4 MG SL SUBL
0.4000 mg | SUBLINGUAL_TABLET | SUBLINGUAL | Status: DC | PRN
  Administered 2011-01-27: 0.4 mg via SUBLINGUAL
  Filled 2011-01-27: qty 25

## 2011-01-27 MED ORDER — HYDROCODONE-ACETAMINOPHEN 5-500 MG PO TABS: 1.0000 | ORAL_TABLET | Freq: Four times a day (QID) | ORAL | Status: DC | PRN

## 2011-01-27 MED ORDER — GI COCKTAIL ~~LOC~~
30.0000 mL | Freq: Once | ORAL | Status: AC
  Administered 2011-01-27: 30 mL via ORAL
  Filled 2011-01-27: qty 30

## 2011-01-27 NOTE — ED Notes (Signed)
Pt. Has c/o chest pain and SOB with pain radiating from his left jaw to the left arm.  Pt. reports having GERD that acts the same way but today the pain got much sharper.  Pt. Has being have constant chest pain for 2 weeks but today is much worse.

## 2011-01-27 NOTE — ED Notes (Signed)
Pt amb to BR with assistance 

## 2011-01-27 NOTE — ED Notes (Signed)
Pt drinking po fluids without any problem.  Attempting to call family

## 2011-01-27 NOTE — ED Notes (Signed)
Grand daughter leaving. Will pick up pt when discharged.  Ph number [phone]

## 2011-01-27 NOTE — ED Provider Notes (Signed)
History     CSN: [id number]  Arrival date & time 01/27/11  0844   First MD Initiated Contact with Patient 01/27/11 [phone]      Chief Complaint  Patient presents with  . Chest Pain  . Gastrophageal Reflux  . Shortness of Breath    (Consider location/radiation/quality/duration/timing/severity/associated sxs/prior treatment) Patient is a 78 y.o. male presenting with chest pain. The history is provided by the patient.  Chest Pain The chest pain began yesterday. Chest pain occurs constantly. The chest pain is worsening. Associated with: nothing. At its most intense, the pain is at 9/10. The pain is currently at 7/10. The severity of the pain is moderate. The quality of the pain is described as aching, sharp and squeezing. The pain radiates to the right shoulder, left shoulder and epigastrium. Exacerbated by: He states sometimes it's worse with eating. Primary symptoms include shortness of breath, abdominal pain and nausea. Pertinent negatives for primary symptoms include no fever, no cough, no wheezing, no palpitations and no vomiting.  Associated symptoms include diaphoresis.  Pertinent negatives for associated symptoms include no lower extremity edema, no numbness and no weakness. He tried nothing for the symptoms. Risk factors include male gender.  His past medical history is significant for CAD, hyperlipidemia and hypertension.  Procedure history is positive for cardiac catheterization.     Past Medical History  Diagnosis Date  . GERD (gastroesophageal reflux disease)   . Hypothyroidism   . Renal disorder   . Chronic back pain   . Hx of heart artery stent   . Myocardial infarction   . Stroke   . Arthritis   . Gout   . Pulmonary embolism, bilateral 2010  . Hyperlipemia     Past Surgical History  Procedure Date  . Appendectomy   . Cardiac catheterization   . Coronary angioplasty with stent placement   . Esophagogastroduodenoscopy 12/22/2010    Procedure:  ESOPHAGOGASTRODUODENOSCOPY (EGD);  Surgeon: Theda Belfast;  Location: [facility];  Service: Endoscopy;  Laterality: N/A;    History reviewed. No pertinent family history.  History  Substance Use Topics  . Smoking status: Former Games developer  . Smokeless tobacco: Not on file  . Alcohol Use: No      Review of Systems  Constitutional: Positive for diaphoresis. Negative for fever.  Respiratory: Positive for shortness of breath. Negative for cough and wheezing.   Cardiovascular: Positive for chest pain. Negative for palpitations.  Gastrointestinal: Positive for nausea and abdominal pain. Negative for vomiting.  Neurological: Negative for weakness and numbness.  All other systems reviewed and are negative.    Allergies  Review of patient's allergies indicates no known allergies.  Home Medications   Current Outpatient Rx  Name Route Sig Dispense Refill  . AMIODARONE HCL 200 MG PO TABS Oral Take 200 mg by mouth daily.     . ASPIRIN 81 MG PO TABS Oral Take 81 mg by mouth daily.      . BECLOMETHASONE DIPROPIONATE 80 MCG/ACT IN AERS Inhalation Inhale 1 puff into the lungs 2 (two) times daily as needed. For shortness of breath    . CARVEDILOL 6.25 MG PO TABS Oral Take 6.25 mg by mouth 2 (two) times daily with a meal.     . CLOPIDOGREL BISULFATE 75 MG PO TABS Oral Take 75 mg by mouth daily.      . DEXLANSOPRAZOLE 60 MG PO CPDR Oral Take 60 mg by mouth daily.     Marland Kitchen FLUTICASONE PROPIONATE 50 MCG/ACT NA SUSP Nasal  Place 2 sprays into the nose daily as needed. For stuffy nose and dryness    . FUROSEMIDE 40 MG PO TABS Oral Take 40 mg by mouth daily.     Marland Kitchen GLIMEPIRIDE 1 MG PO TABS Oral Take 1 mg by mouth daily before breakfast.      . ISOSORBIDE MONONITRATE ER 60 MG PO TB24 Oral Take 60 mg by mouth 2 (two) times daily.     Marland Kitchen LEVOTHYROXINE SODIUM 50 MCG PO TABS Oral Take 50 mcg by mouth daily.     Marland Kitchen NITROGLYCERIN 0.4 MG SL SUBL Sublingual Place 0.4 mg under the tongue every 5 (five) minutes as  needed. For chest pain     . ONDANSETRON HCL 4 MG PO TABS Oral Take 2 tablets (8 mg total) by mouth every 6 (six) hours. 12 tablet 0  . POLYETHYLENE GLYCOL 3350 PO PACK Oral Take 17 g by mouth daily as needed. For constipation    . POLYVINYL ALCOHOL 1.4 % OP SOLN Both Eyes Place 1 drop into both eyes as needed. For dry eyes     . RANITIDINE HCL 300 MG PO CAPS Oral Take 300 mg by mouth every evening.      Marland Kitchen ROSUVASTATIN CALCIUM 10 MG PO TABS Oral Take 10 mg by mouth daily.     Bernadette Hoit SODIUM 8.6-50 MG PO TABS Oral Take 1 tablet by mouth daily.      Marland Kitchen SPIRONOLACTONE 25 MG PO TABS Oral Take 25 mg by mouth daily.     Marland Kitchen TAMSULOSIN HCL 0.4 MG PO CAPS Oral Take 0.4 mg by mouth daily.      . TRAMADOL HCL 50 MG PO TABS Oral Take 50 mg by mouth every 6 (six) hours as needed. For pain Maximum dose= 8 tablets per day      BP 190/78  Pulse 63  Temp(Src) 98.3 F (36.8 C) (Oral)  Resp 17  SpO2 97%  Physical Exam  Nursing note and vitals reviewed. Constitutional: He is oriented to person, place, and time. He appears well-developed and well-nourished. No distress.  HENT:  Head: Normocephalic and atraumatic.  Mouth/Throat: Oropharynx is clear and moist.  Eyes: Conjunctivae and EOM are normal. Pupils are equal, round, and reactive to light.  Neck: Normal range of motion. Neck supple.  Cardiovascular: Normal rate, regular rhythm and intact distal pulses.   No murmur heard. Pulmonary/Chest: Effort normal and breath sounds normal. No respiratory distress. He has no wheezes. He has no rales. He exhibits no tenderness.  Abdominal: Soft. He exhibits no distension. There is no tenderness. There is no rebound and no guarding.  Musculoskeletal: Normal range of motion. He exhibits edema. He exhibits no tenderness.       1+ pitting edema of the bilateral lower extremities  Neurological: He is alert and oriented to person, place, and time.  Skin: Skin is warm and dry. No rash noted. No erythema.    Psychiatric: He has a normal mood and affect. His behavior is normal.    ED Course  Procedures (including critical care time)  Labs Reviewed  CBC - Abnormal; Notable for the following:    HCT 38.3 (*)    All other components within normal limits  COMPREHENSIVE METABOLIC PANEL - Abnormal; Notable for the following:    BUN 26 (*)    Creatinine, Ser 2.39 (*)    Albumin 3.3 (*)    GFR calc non Af Amer 24 (*)    GFR calc Af Amer 28 (*)  All other components within normal limits  PROTIME-INR - Abnormal; Notable for the following:    Prothrombin Time 29.6 (*)    INR 2.76 (*)    All other components within normal limits  DIFFERENTIAL  LIPASE, BLOOD  CARDIAC PANEL(CRET KIN+CKTOT+MB+TROPI)  APTT  POCT I-STAT TROPONIN I  I-STAT TROPONIN I   Dg Chest Port 1 View  01/27/2011  *RADIOLOGY REPORT*  Clinical Data: Chest pain  PORTABLE CHEST - 1 VIEW  Comparison: 11/27/2010  Findings: Lungs are essentially clear. No pleural effusion or pneumothorax.  Stable mild cardiomegaly.  IMPRESSION: No evidence of acute cardiopulmonary disease.  Stable mild cardiomegaly.  Original Report Authenticated By: Charline Bills, M.D.    Date: 01/27/2011  Rate: 62  Rhythm: normal sinus rhythm  QRS Axis: LAD  Intervals: normal  ST/T Wave abnormalities: nonspecific ST/T changes  Conduction Disutrbances:left bundle branch block  Narrative Interpretation:   Old EKG Reviewed: unchanged    No diagnosis found.    MDM   Patient with complaint of chest pain. He states that he's had abdominal/GERD symptoms for the last 2 weeks but developed severe chest pain starting last night and into today. It radiates into both arms and in his stomach. He states that he saw GI in the last one month and they told him they could not find anything wrong with him however use of a hiatal hernia. He states the pain is not worse with eating, deep breathing. However he states 30 minutes after he eats he will get severe abdominal  pain which has been going on now for 2-3 months. Patient has an extensive cardiac history including stenting x3 most recent stent was a few years ago with Dr. Sharyn Lull.  His last admission was September 2012 for atypical chest pain and she was ruled out and did not require stenting at that time. Patient states that he saw Dr. Sharyn Lull on Thursday and was taken off his Coumadin and started on Plavix. EKG persistent left bundle branch block. Chest x-ray within normal limits. CBC, CMP, lipase, cardiac enzymes, troponin were within normal limits. After aspirin and nitroglycerin he states his pain is much improved. Will discuss this case cardiology.  12:46 PM Spoke with Dr. Sharyn Lull and he is very familiar with the patient. He recently saw him in clinic on Thursday and given his symptoms have now been severe for over 24 hours and a negative set of cardiac markers unlikely to be cardiac in origin. Most likely abdominal in origin given his history of prior pancreatitis, hiatal hernia, persistent abdominal pain. Patient recently had a CT done which had no acute pathology. Dr. Sharyn Lull felt that if the second set of markers were negative he can go home. He states his pain is improved after the nitroglycerin. This most likely is due to relieving his esophageal spasm    3:53 PM On reevaluation patient states his abdomen is killing him however he is eating by mouth but her crackers without any gagging, nausea or vomiting. Discussed in length with his granddaughter all the recent results and speaking with Dr. Sharyn Lull. She understands and she is going to have him followup with him and GI for persistent pain. Will stop the tramadol and put patient on Vicodin.  Gwyneth Sprout, MD 01/27/11 1554

## 2011-01-27 NOTE — ED Notes (Signed)
Family at bedside. 

## 2011-01-27 NOTE — ED Notes (Signed)
Pt sleeping, awakens easily

## 2011-01-27 NOTE — ED Notes (Signed)
Pt states he feels better and is ready to go home.

## 2011-01-30 ENCOUNTER — Encounter (HOSPITAL_COMMUNITY): Payer: Self-pay | Admitting: Emergency Medicine

## 2011-01-30 ENCOUNTER — Emergency Department (HOSPITAL_COMMUNITY)
Admission: EM | Admit: 2011-01-30 | Discharge: 2011-01-30 | Disposition: A | Discharge status: Home / Self Care | Payer: Medicare Other | Attending: Emergency Medicine | Admitting: Emergency Medicine

## 2011-01-30 DIAGNOSIS — R110 Nausea: Secondary | ICD-10-CM

## 2011-01-30 DIAGNOSIS — R112 Nausea with vomiting, unspecified: Secondary | ICD-10-CM | POA: Insufficient documentation

## 2011-01-30 DIAGNOSIS — R10819 Abdominal tenderness, unspecified site: Secondary | ICD-10-CM | POA: Insufficient documentation

## 2011-01-30 DIAGNOSIS — R1110 Vomiting, unspecified: Secondary | ICD-10-CM

## 2011-01-30 DIAGNOSIS — Z86711 Personal history of pulmonary embolism: Secondary | ICD-10-CM | POA: Insufficient documentation

## 2011-01-30 DIAGNOSIS — E039 Hypothyroidism, unspecified: Secondary | ICD-10-CM | POA: Insufficient documentation

## 2011-01-30 DIAGNOSIS — I252 Old myocardial infarction: Secondary | ICD-10-CM | POA: Insufficient documentation

## 2011-01-30 DIAGNOSIS — R109 Unspecified abdominal pain: Secondary | ICD-10-CM | POA: Insufficient documentation

## 2011-01-30 DIAGNOSIS — Z79899 Other long term (current) drug therapy: Secondary | ICD-10-CM | POA: Insufficient documentation

## 2011-01-30 DIAGNOSIS — Z8673 Personal history of transient ischemic attack (TIA), and cerebral infarction without residual deficits: Secondary | ICD-10-CM | POA: Insufficient documentation

## 2011-01-30 DIAGNOSIS — M129 Arthropathy, unspecified: Secondary | ICD-10-CM | POA: Insufficient documentation

## 2011-01-30 DIAGNOSIS — K219 Gastro-esophageal reflux disease without esophagitis: Secondary | ICD-10-CM | POA: Insufficient documentation

## 2011-01-30 LAB — URINALYSIS, ROUTINE W REFLEX MICROSCOPIC
Glucose, UA: NEGATIVE mg/dL
Hgb urine dipstick: NEGATIVE
Ketones, ur: NEGATIVE mg/dL
Leukocytes, UA: NEGATIVE
Protein, ur: NEGATIVE mg/dL
Urobilinogen, UA: 0.2 mg/dL (ref 0.0–1.0)

## 2011-01-30 LAB — COMPREHENSIVE METABOLIC PANEL
AST: 33 U/L (ref 0–37)
CO2: 30 mEq/L (ref 19–32)
Chloride: 99 mEq/L (ref 96–112)
Creatinine, Ser: 2.71 mg/dL — ABNORMAL HIGH (ref 0.50–1.35)
GFR calc Af Amer: 24 mL/min — ABNORMAL LOW (ref >90)
GFR calc non Af Amer: 21 mL/min — ABNORMAL LOW (ref >90)
Glucose, Bld: 95 mg/dL (ref 70–99)
Total Bilirubin: 0.3 mg/dL (ref 0.3–1.2)

## 2011-01-30 LAB — CBC
HCT: 37.2 % — ABNORMAL LOW (ref 39.0–52.0)
Hemoglobin: 12.9 g/dL — ABNORMAL LOW (ref 13.0–17.0)
MCH: 28.4 pg (ref 26.0–34.0)
MCV: 81.8 fL (ref 78.0–100.0)
Platelets: 156 10*3/uL (ref 150–400)
RBC: 4.55 MIL/uL (ref 4.22–5.81)
WBC: 7.5 10*3/uL (ref 4.0–10.5)

## 2011-01-30 LAB — LACTIC ACID, PLASMA: Lactic Acid, Venous: 1.2 mmol/L (ref 0.5–2.2)

## 2011-01-30 MED ORDER — SODIUM CHLORIDE 0.9 % IV BOLUS (SEPSIS)
1000.0000 mL | Freq: Once | INTRAVENOUS | Status: AC
  Administered 2011-01-30: 1000 mL via INTRAVENOUS

## 2011-01-30 MED ORDER — ONDANSETRON HCL 4 MG/2ML IJ SOLN
4.0000 mg | Freq: Once | INTRAMUSCULAR | Status: AC
  Administered 2011-01-30: 4 mg via INTRAVENOUS
  Filled 2011-01-30: qty 2

## 2011-01-30 NOTE — ED Provider Notes (Signed)
Care from Dr. Gwendolyn Grant. Patient here with acute on chronic nausea and vomiting. His evaluation shows mildly increased creatinine based on baseline. Dr. Gwendolyn Grant and discuss case with patient's primary care doctor, Dr. Sharyn Lull.  Patient is awaiting results from urinalysis. If unremarkable, plan for outpatient management and PCP followup. Patient and PCP are in agreement with this plan.  UA unremarkable. Patient told to hold his spironolactone for 5 days at request of Dr. Sharyn Lull.  He will followup on an outpatient basis. Stable for DC home.  [name] 01/30/11 1725

## 2011-01-30 NOTE — ED Provider Notes (Addendum)
History     CSN: [id number]  Arrival date & time 01/30/11  1336   First MD Initiated Contact with Patient 01/30/11 1346      Chief Complaint  Patient presents with  . Abdominal Pain    (Consider location/radiation/quality/duration/timing/severity/associated sxs/prior treatment) Patient is a 78 y.o. male presenting with vomiting. The history is provided by the patient. No language interpreter was used.  Emesis  This is a chronic problem. The current episode started more than 1 week ago. The problem occurs 5 to 10 times per day. The problem has not changed since onset.The emesis has an appearance of stomach contents. There has been no fever. Associated symptoms include abdominal pain. Pertinent negatives include no chills, no cough, no diarrhea, no fever, no headaches and no URI.    Past Medical History  Diagnosis Date  . GERD (gastroesophageal reflux disease)   . Hypothyroidism   . Renal disorder   . Chronic back pain   . Hx of heart artery stent   . Myocardial infarction   . Stroke   . Arthritis   . Gout   . Pulmonary embolism, bilateral 2010  . Hyperlipemia     Past Surgical History  Procedure Date  . Appendectomy   . Cardiac catheterization   . Coronary angioplasty with stent placement   . Esophagogastroduodenoscopy 12/22/2010    Procedure: ESOPHAGOGASTRODUODENOSCOPY (EGD);  Surgeon: Theda Belfast;  Location: [facility];  Service: Endoscopy;  Laterality: N/A;    No family history on file.  History  Substance Use Topics  . Smoking status: Former Games developer  . Smokeless tobacco: Not on file  . Alcohol Use: No      Review of Systems  Constitutional: Negative for fever and chills.  Respiratory: Negative for cough and shortness of breath.   Cardiovascular: Negative for chest pain.  Gastrointestinal: Positive for nausea, vomiting and abdominal pain. Negative for diarrhea.  Neurological: Negative for headaches.  All other systems reviewed and are  negative.    Allergies  Review of patient's allergies indicates no known allergies.  Home Medications   Current Outpatient Rx  Name Route Sig Dispense Refill  . AMIODARONE HCL 200 MG PO TABS Oral Take 200 mg by mouth daily.     . ASPIRIN 81 MG PO TABS Oral Take 81 mg by mouth daily.      . BECLOMETHASONE DIPROPIONATE 80 MCG/ACT IN AERS Inhalation Inhale 1 puff into the lungs 2 (two) times daily as needed. For shortness of breath    . CARVEDILOL 6.25 MG PO TABS Oral Take 6.25 mg by mouth 2 (two) times daily with a meal.     . CLOPIDOGREL BISULFATE 75 MG PO TABS Oral Take 75 mg by mouth daily.      . DEXLANSOPRAZOLE 60 MG PO CPDR Oral Take 60 mg by mouth daily.     Marland Kitchen FLUTICASONE PROPIONATE 50 MCG/ACT NA SUSP Nasal Place 2 sprays into the nose daily as needed. For stuffy nose and dryness    . FUROSEMIDE 40 MG PO TABS Oral Take 40 mg by mouth daily.     Marland Kitchen GLIMEPIRIDE 1 MG PO TABS Oral Take 1 mg by mouth daily before breakfast.      . HYDROCODONE-ACETAMINOPHEN 5-500 MG PO TABS Oral Take 1 tablet by mouth every 6 (six) hours as needed for pain. 15 tablet 0  . ISOSORBIDE MONONITRATE ER 60 MG PO TB24 Oral Take 60 mg by mouth 2 (two) times daily.     Marland Kitchen LEVOTHYROXINE  SODIUM 50 MCG PO TABS Oral Take 50 mcg by mouth daily.     Marland Kitchen NITROGLYCERIN 0.4 MG SL SUBL Sublingual Place 0.4 mg under the tongue every 5 (five) minutes as needed. For chest pain     . ONDANSETRON HCL 4 MG PO TABS Oral Take 2 tablets (8 mg total) by mouth every 6 (six) hours. 12 tablet 0  . POLYETHYLENE GLYCOL 3350 PO PACK Oral Take 17 g by mouth daily as needed. For constipation    . POLYVINYL ALCOHOL 1.4 % OP SOLN Both Eyes Place 1 drop into both eyes as needed. For dry eyes     . RANITIDINE HCL 300 MG PO CAPS Oral Take 300 mg by mouth every evening.      Marland Kitchen ROSUVASTATIN CALCIUM 10 MG PO TABS Oral Take 10 mg by mouth daily.     Bernadette Hoit SODIUM 8.6-50 MG PO TABS Oral Take 1 tablet by mouth daily.      Marland Kitchen SPIRONOLACTONE  25 MG PO TABS Oral Take 25 mg by mouth daily.     Marland Kitchen TAMSULOSIN HCL 0.4 MG PO CAPS Oral Take 0.4 mg by mouth daily.        BP 154/75  Pulse 60  Temp(Src) 98.2 F (36.8 C) (Oral)  SpO2 96%  Physical Exam  Constitutional: He is oriented to person, place, and time. He appears well-developed and well-nourished. No distress.  HENT:  Head: Normocephalic and atraumatic.  Mouth/Throat: No oropharyngeal exudate.  Eyes: EOM are normal. Pupils are equal, round, and reactive to light.  Neck: Normal range of motion. Neck supple.  Cardiovascular: Normal rate and regular rhythm.  Exam reveals no friction rub.   No murmur heard. Pulmonary/Chest: Effort normal and breath sounds normal. No respiratory distress. He has no wheezes. He has no rales.  Abdominal: He exhibits no distension. There is tenderness (mild, lower). There is no rebound.  Musculoskeletal: Normal range of motion. He exhibits no edema.  Neurological: He is alert and oriented to person, place, and time.  Skin: He is not diaphoretic.    ED Course  Procedures (including critical care time)  Labs Reviewed  CBC - Abnormal; Notable for the following:    Hemoglobin 12.9 (*)    HCT 37.2 (*)    All other components within normal limits  COMPREHENSIVE METABOLIC PANEL - Abnormal; Notable for the following:    BUN 34 (*)    Creatinine, Ser 2.71 (*)    Albumin 3.1 (*)    GFR calc non Af Amer 21 (*)    GFR calc Af Amer 24 (*)    All other components within normal limits  LIPASE, BLOOD  LACTIC ACID, PLASMA  LACTIC ACID, PLASMA  URINALYSIS, ROUTINE W REFLEX MICROSCOPIC   No results found.   1. Chronic nausea   2. Chronic vomiting       MDM  Patient is a 78 year old male who presents with nausea and vomiting. Patient states this has been off for the past 6 months. Patient has been seen multiple times and twice within the last week for similar complaints. Last visit he is here for chest pain with the associated nausea/vomiting. He  had negative cardiac markers at that time. The doctor at that time spoke with his primary care physician who stated he will follow him up in clinic and the diagnosis is chronic nausea/vomiting. Patient's well-appearing today. He reports retching. Denies fever, cough, chest pain. Denies dysuria, hematuria. Patient has mild lower abdominal tenderness otherwise exam is  stable. Since patient has been worked up for this chronic problem multiple times recently and these findings appear to be related to his chronic nausea and vomiting,, we'll check baseline labs and give him diaphragmatic medications. No concern for cardiac disease as patient has had multiple cardiac workups for the similar problem, all of which were negative. EKG with left bundle branch block, seen on previous. No signs of acute ischemia. Labs show chronic kidney disease, which is stable. No increase in LFTs. Patient care transferred to Dr. Zebedee Iba while awaiting urinalysis. Stable for discharge if UTI positive and can be treated as an outpatient with antibiotics. Spoke with Dr. Sharyn Lull, patient's PCP, about current situation. He stated since labs show mild elevation in creatinine to stop his spironolactone for 5 days. Dr. Sharyn Lull instructed for him to follow up in a week.     Elwin Mocha, MD 01/30/11 1625  Elwin Mocha, MD 01/30/11 [phone]

## 2011-01-30 NOTE — ED Notes (Signed)
Complaining of abd pain with n/v since august. Reports being seen for same in past. Dx with GERD.

## 2011-01-30 NOTE — ED Notes (Signed)
Pt complaining of nausea and vomiting since Saturday. Pt stated that it was increasingly getting worse. No diarrhea. No chills. No SOB or CP. Abdomen not tender to touch. LBM today. Will continue to monitor.

## 2011-01-30 NOTE — ED Provider Notes (Signed)
  I performed a history and physical examination of [name] and discussed his management with Dr. Gwendolyn Grant.  I agree with the history, physical, assessment, and plan of care, with the following exceptions: None  Chronic abdominal pain and nausea and vomiting. His had numerous emergency department visits for similar episodes. Nonbloody, nonbilious emesis  Abdomen soft,, nondistended. Regular rate and rhythm.  I was present for the following procedures: None Time Spent in Critical Care of the patient: None  Tildon Husky, MD 01/30/11 [phone]

## 2011-02-01 ENCOUNTER — Other Ambulatory Visit: Payer: Self-pay | Admitting: Gastroenterology

## 2011-02-01 ENCOUNTER — Emergency Department (HOSPITAL_COMMUNITY): Payer: Medicare Other

## 2011-02-01 ENCOUNTER — Emergency Department (HOSPITAL_COMMUNITY)
Admission: EM | Admit: 2011-02-01 | Discharge: 2011-02-01 | Disposition: A | Discharge status: Home / Self Care | Payer: Medicare Other | Attending: Emergency Medicine | Admitting: Emergency Medicine

## 2011-02-01 ENCOUNTER — Encounter (HOSPITAL_COMMUNITY): Payer: Self-pay | Admitting: *Deleted

## 2011-02-01 DIAGNOSIS — E039 Hypothyroidism, unspecified: Secondary | ICD-10-CM | POA: Insufficient documentation

## 2011-02-01 DIAGNOSIS — R112 Nausea with vomiting, unspecified: Secondary | ICD-10-CM | POA: Insufficient documentation

## 2011-02-01 DIAGNOSIS — Z8673 Personal history of transient ischemic attack (TIA), and cerebral infarction without residual deficits: Secondary | ICD-10-CM | POA: Insufficient documentation

## 2011-02-01 DIAGNOSIS — K5900 Constipation, unspecified: Secondary | ICD-10-CM | POA: Insufficient documentation

## 2011-02-01 DIAGNOSIS — G8929 Other chronic pain: Secondary | ICD-10-CM | POA: Insufficient documentation

## 2011-02-01 DIAGNOSIS — R109 Unspecified abdominal pain: Secondary | ICD-10-CM | POA: Insufficient documentation

## 2011-02-01 DIAGNOSIS — R10817 Generalized abdominal tenderness: Secondary | ICD-10-CM | POA: Insufficient documentation

## 2011-02-01 DIAGNOSIS — R1084 Generalized abdominal pain: Secondary | ICD-10-CM

## 2011-02-01 DIAGNOSIS — K5730 Diverticulosis of large intestine without perforation or abscess without bleeding: Secondary | ICD-10-CM | POA: Insufficient documentation

## 2011-02-01 DIAGNOSIS — K921 Melena: Secondary | ICD-10-CM | POA: Insufficient documentation

## 2011-02-01 DIAGNOSIS — I252 Old myocardial infarction: Secondary | ICD-10-CM | POA: Insufficient documentation

## 2011-02-01 LAB — URINALYSIS, ROUTINE W REFLEX MICROSCOPIC
Glucose, UA: NEGATIVE mg/dL
Hgb urine dipstick: NEGATIVE
Specific Gravity, Urine: 1.009 (ref 1.005–1.030)
pH: 7 (ref 5.0–8.0)

## 2011-02-01 LAB — DIFFERENTIAL
Basophils Absolute: 0 10*3/uL (ref 0.0–0.1)
Lymphocytes Relative: 26 % (ref 12–46)
Monocytes Absolute: 0.7 10*3/uL (ref 0.1–1.0)
Neutro Abs: 3.9 10*3/uL (ref 1.7–7.7)

## 2011-02-01 LAB — CBC
HCT: 34.1 % — ABNORMAL LOW (ref 39.0–52.0)
RDW: 14.8 % (ref 11.5–15.5)
WBC: 6.3 10*3/uL (ref 4.0–10.5)

## 2011-02-01 LAB — BASIC METABOLIC PANEL
CO2: 26 mEq/L (ref 19–32)
Chloride: 94 mEq/L — ABNORMAL LOW (ref 96–112)
Creatinine, Ser: 2.42 mg/dL — ABNORMAL HIGH (ref 0.50–1.35)
Sodium: 130 mEq/L — ABNORMAL LOW (ref 135–145)

## 2011-02-01 LAB — HEPATIC FUNCTION PANEL
ALT: 47 U/L (ref 0–53)
AST: 40 U/L — ABNORMAL HIGH (ref 0–37)
Albumin: 3.3 g/dL — ABNORMAL LOW (ref 3.5–5.2)
Alkaline Phosphatase: 78 U/L (ref 39–117)
Total Protein: 6.8 g/dL (ref 6.0–8.3)

## 2011-02-01 LAB — OCCULT BLOOD, POC DEVICE: Fecal Occult Bld: NEGATIVE

## 2011-02-01 MED ORDER — SODIUM CHLORIDE 0.9 % IV SOLN
INTRAVENOUS | Status: DC
  Administered 2011-02-01: 09:00:00 via INTRAVENOUS

## 2011-02-01 MED ORDER — LACTULOSE 10 GM/15ML PO SOLN
30.0000 g | Freq: Once | ORAL | Status: AC
  Administered 2011-02-01: 30 g via ORAL
  Filled 2011-02-01: qty 45

## 2011-02-01 MED ORDER — ONDANSETRON HCL 4 MG/2ML IJ SOLN
4.0000 mg | Freq: Once | INTRAMUSCULAR | Status: AC
  Administered 2011-02-01: 4 mg via INTRAVENOUS
  Filled 2011-02-01: qty 2

## 2011-02-01 NOTE — ED Provider Notes (Signed)
History     CSN: [id number]  Arrival date & time 02/01/11  4098   First MD Initiated Contact with Patient 02/01/11 [phone]      Chief Complaint  Patient presents with  . Constipation  . Nausea  . Emesis    (Consider location/radiation/quality/duration/timing/severity/associated sxs/prior treatment) Patient is a 78 y.o. male presenting with constipation and vomiting. The history is provided by the patient and medical records.  Constipation  The current episode started more than 2 weeks ago. The onset was gradual. The problem occurs frequently. The problem has been gradually worsening. The pain is moderate. The stool is described as soft (Hard, black, small caliber, occasionally "like jelly" per the patient). There was no prior successful therapy. Prior unsuccessful therapies include fiber, stool softeners and laxatives. Associated symptoms include anorexia, abdominal pain, nausea and vomiting. Pertinent negatives include no fever, no diarrhea, no hematemesis, no hemorrhoids, no rectal pain, no hematuria, no chest pain, no headaches, no coughing, no difficulty breathing and no rash. He has been behaving normally. He has been eating less than usual. His past medical history is significant for abdominal surgery. Past medical history comments: Chronic abdominal pain, nausea, and vomiting with numerous ER visits in the past. . Recently, medical care has been given at another facility. Services received include tests performed.  Emesis  Associated symptoms include abdominal pain. Pertinent negatives include no cough, no diarrhea, no fever and no headaches.    Past Medical History  Diagnosis Date  . GERD (gastroesophageal reflux disease)   . Hypothyroidism   . Renal disorder   . Chronic back pain   . Hx of heart artery stent   . Myocardial infarction   . Stroke   . Arthritis   . Gout   . Pulmonary embolism, bilateral 2010  . Hyperlipemia     Past Surgical History  Procedure Date  .  Appendectomy   . Cardiac catheterization   . Coronary angioplasty with stent placement   . Esophagogastroduodenoscopy 12/22/2010    Procedure: ESOPHAGOGASTRODUODENOSCOPY (EGD);  Surgeon: Theda Belfast;  Location: [facility];  Service: Endoscopy;  Laterality: N/A;    No family history on file.  History  Substance Use Topics  . Smoking status: Former Games developer  . Smokeless tobacco: Not on file  . Alcohol Use: No      Review of Systems  Constitutional: Positive for appetite change. Negative for fever, fatigue and unexpected weight change.  HENT: Negative.   Eyes: Negative.   Respiratory: Negative for cough and shortness of breath.   Cardiovascular: Negative for chest pain.  Gastrointestinal: Positive for nausea, vomiting, abdominal pain, constipation, blood in stool and anorexia. Negative for diarrhea, abdominal distention, rectal pain, hematemesis and hemorrhoids.  Genitourinary: Negative for dysuria, frequency, hematuria and flank pain.  Musculoskeletal: Negative for back pain.  Skin: Negative for color change and rash.  Neurological: Negative for weakness, light-headedness and headaches.  Hematological: Does not bruise/bleed easily.  Psychiatric/Behavioral: Negative.     Allergies  Review of patient's allergies indicates no known allergies.  Home Medications   Current Outpatient Rx  Name Route Sig Dispense Refill  . AMIODARONE HCL 200 MG PO TABS Oral Take 200 mg by mouth daily.     . ASPIRIN 81 MG PO TABS Oral Take 81 mg by mouth daily.      . BECLOMETHASONE DIPROPIONATE 80 MCG/ACT IN AERS Inhalation Inhale 1 puff into the lungs 2 (two) times daily as needed. For shortness of breath    . CARVEDILOL  6.25 MG PO TABS Oral Take 6.25 mg by mouth 2 (two) times daily with a meal.     . CLOPIDOGREL BISULFATE 75 MG PO TABS Oral Take 75 mg by mouth daily.      . DEXLANSOPRAZOLE 60 MG PO CPDR Oral Take 60 mg by mouth daily. For reflux    . FLUTICASONE PROPIONATE 50 MCG/ACT NA SUSP  Nasal Place 2 sprays into the nose daily as needed. For stuffy nose and dryness    . FUROSEMIDE 40 MG PO TABS Oral Take 40 mg by mouth daily.     Marland Kitchen GLIMEPIRIDE 1 MG PO TABS Oral Take 1 mg by mouth daily before breakfast.     . HYDROCODONE-ACETAMINOPHEN 5-500 MG PO TABS Oral Take 1 tablet by mouth every 6 (six) hours as needed. For pain     . ISOSORBIDE MONONITRATE ER 60 MG PO TB24 Oral Take 60 mg by mouth 2 (two) times daily.     Marland Kitchen LEVOTHYROXINE SODIUM 50 MCG PO TABS Oral Take 50 mcg by mouth daily.     Marland Kitchen POLYETHYLENE GLYCOL 3350 PO PACK Oral Take 17 g by mouth daily as needed. For constipation    . POLYVINYL ALCOHOL 1.4 % OP SOLN Both Eyes Place 1 drop into both eyes as needed. For dry eyes     . RANITIDINE HCL 300 MG PO CAPS Oral Take 300 mg by mouth every evening.      Marland Kitchen ROSUVASTATIN CALCIUM 10 MG PO TABS Oral Take 10 mg by mouth daily.     Bernadette Hoit SODIUM 8.6-50 MG PO TABS Oral Take 1 tablet by mouth daily as needed. For constipation    . SPIRONOLACTONE 25 MG PO TABS Oral Take 25 mg by mouth daily.     Marland Kitchen TAMSULOSIN HCL 0.4 MG PO CAPS Oral Take 0.4 mg by mouth at bedtime.     Marland Kitchen NITROGLYCERIN 0.4 MG SL SUBL Sublingual Place 0.4 mg under the tongue every 5 (five) minutes as needed. For chest pain       BP 95/54  Pulse 61  Temp(Src) 98.9 F (37.2 C) (Oral)  Resp 22  SpO2 99%  Physical Exam  Nursing note and vitals reviewed. Constitutional: He is oriented to person, place, and time. He appears well-developed and well-nourished. He is active.  Non-toxic appearance. He does not have a sickly appearance. He does not appear ill. No distress.  HENT:  Head: Normocephalic and atraumatic.  Right Ear: Hearing, tympanic membrane, external ear and ear canal normal.  Left Ear: Hearing, tympanic membrane, external ear and ear canal normal.  Nose: Nose normal. No mucosal edema.  Mouth/Throat: Uvula is midline and oropharynx is clear and moist. Mucous membranes are not dry. No oral  lesions. No uvula swelling. No oropharyngeal exudate, posterior oropharyngeal edema, posterior oropharyngeal erythema or tonsillar abscesses.  Eyes: Conjunctivae and EOM are normal. Pupils are equal, round, and reactive to light. Right eye exhibits no chemosis, no discharge and no exudate. Left eye exhibits no chemosis, no discharge and no exudate. Right conjunctiva is not injected. Left conjunctiva is not injected. No scleral icterus.  Neck: Normal range of motion, full passive range of motion without pain and phonation normal. Neck supple. No rigidity. No Brudzinski's sign noted.  Cardiovascular: Normal rate, regular rhythm, intact distal pulses and normal pulses.   No extrasystoles are present. Exam reveals no gallop and no friction rub.   No murmur heard. Pulmonary/Chest: Effort normal and breath sounds normal. No accessory muscle usage. Not  tachypneic. No respiratory distress. He has no decreased breath sounds. He has no wheezes. He has no rhonchi. He has no rales. He exhibits no tenderness, no crepitus and no retraction.  Abdominal: Soft. Normal appearance. He exhibits no shifting dullness, no distension, no pulsatile liver, no fluid wave, no abdominal bruit, no ascites, no pulsatile midline mass and no mass. Bowel sounds are increased. There is no hepatosplenomegaly. There is generalized tenderness. There is guarding. There is no rigidity and no rebound. No hernia.  Musculoskeletal: Normal range of motion.  Neurological: He is alert and oriented to person, place, and time. He has normal strength and normal reflexes. He is not disoriented. No cranial nerve deficit. Coordination normal. GCS eye subscore is 4. GCS verbal subscore is 5. GCS motor subscore is 6.  Skin: Skin is warm, dry and intact. No bruising, no ecchymosis, no lesion and no rash noted. He is not diaphoretic. No erythema. No pallor.  Psychiatric: He has a normal mood and affect. His speech is normal and behavior is normal. Judgment and  thought content normal. Cognition and memory are normal.    ED Course  Procedures (including critical care time)   Labs Reviewed  OCCULT BLOOD, POC DEVICE  POCT OCCULT BLOOD STOOL, DEVICE  CBC  DIFFERENTIAL  BASIC METABOLIC PANEL  HEPATIC FUNCTION PANEL  LIPASE, BLOOD  LACTIC ACID, PLASMA  URINALYSIS, ROUTINE W REFLEX MICROSCOPIC   No results found.   No diagnosis found.    MDM  The patient has a history of frequent ER visits for chronic nausea and vomiting, abdominal pain which on previous workups has not been revealed to be due to any serious identifiable underlying pathology. With this in mind, as well as the knowledge that he has had a CT scan in the last 2 months of his abdomen, I do feel that the CT scan of his abdomen is indicated do to him citing "jellylike stool", as well as black stool, as well as decreased caliber of stool, which suggest the diagnoses of bowel obstruction, mesenteric ischemia, or GI bleed. His abdomen is generally tender with some guarding but no rebound tenderness. I do feel that the CT abdomen is indicated to thoroughly evaluate his complaint.       Felisa Bonier, MD 02/01/11 [phone]

## 2011-02-01 NOTE — ED Notes (Signed)
Pt states he started to have nausea and vomiting last night. Pt states he has not has a regular bm for 2 weeks. Pt states when his uses the bathroom now it looks like jelly. Pt denies any abdominal distention.

## 2011-02-01 NOTE — ED Notes (Addendum)
Patient complains of constipation for 5 months, has some small hard round bowel movements at times and some that look like jelly and are clear at times, today abdominal pain is worse described as cramping throughout abdomin. Pt states also has nausea but unable to vomit. Pt uses suppositories and miralax with no relief of symptoms

## 2011-02-01 NOTE — ED Notes (Signed)
Patient verbalized understanding of discharge instruction.  

## 2011-02-01 NOTE — ED Notes (Signed)
Stool sample collected by EDP

## 2011-02-07 ENCOUNTER — Other Ambulatory Visit: Payer: Self-pay | Admitting: Gastroenterology

## 2011-02-07 ENCOUNTER — Ambulatory Visit (HOSPITAL_COMMUNITY)
Admission: RE | Admit: 2011-02-07 | Discharge: 2011-02-07 | Disposition: A | Discharge status: Home / Self Care | Payer: Medicare Other | Source: Ambulatory Visit | Attending: Gastroenterology | Admitting: Gastroenterology

## 2011-02-07 DIAGNOSIS — I10 Essential (primary) hypertension: Secondary | ICD-10-CM | POA: Insufficient documentation

## 2011-02-07 DIAGNOSIS — K5730 Diverticulosis of large intestine without perforation or abscess without bleeding: Secondary | ICD-10-CM | POA: Insufficient documentation

## 2011-02-07 DIAGNOSIS — R1084 Generalized abdominal pain: Secondary | ICD-10-CM

## 2011-02-07 DIAGNOSIS — E119 Type 2 diabetes mellitus without complications: Secondary | ICD-10-CM | POA: Insufficient documentation

## 2011-02-07 DIAGNOSIS — R109 Unspecified abdominal pain: Secondary | ICD-10-CM | POA: Insufficient documentation

## 2011-02-07 DIAGNOSIS — R112 Nausea with vomiting, unspecified: Secondary | ICD-10-CM | POA: Insufficient documentation

## 2011-02-07 DIAGNOSIS — R944 Abnormal results of kidney function studies: Secondary | ICD-10-CM | POA: Insufficient documentation

## 2011-02-07 DIAGNOSIS — N289 Disorder of kidney and ureter, unspecified: Secondary | ICD-10-CM | POA: Insufficient documentation

## 2011-02-07 DIAGNOSIS — I700 Atherosclerosis of aorta: Secondary | ICD-10-CM | POA: Insufficient documentation

## 2011-02-07 DIAGNOSIS — N281 Cyst of kidney, acquired: Secondary | ICD-10-CM | POA: Insufficient documentation

## 2011-02-14 ENCOUNTER — Other Ambulatory Visit: Payer: Self-pay

## 2011-02-14 ENCOUNTER — Encounter (HOSPITAL_COMMUNITY): Payer: Self-pay | Admitting: *Deleted

## 2011-02-14 ENCOUNTER — Emergency Department (HOSPITAL_COMMUNITY)
Admission: EM | Admit: 2011-02-14 | Discharge: 2011-02-14 | Disposition: A | Discharge status: Home / Self Care | Payer: Medicare Other | Source: Home / Self Care | Attending: Emergency Medicine | Admitting: Emergency Medicine

## 2011-02-14 ENCOUNTER — Emergency Department (HOSPITAL_COMMUNITY): Payer: Medicare Other

## 2011-02-14 DIAGNOSIS — I252 Old myocardial infarction: Secondary | ICD-10-CM | POA: Insufficient documentation

## 2011-02-14 DIAGNOSIS — Z79899 Other long term (current) drug therapy: Secondary | ICD-10-CM | POA: Insufficient documentation

## 2011-02-14 DIAGNOSIS — K219 Gastro-esophageal reflux disease without esophagitis: Secondary | ICD-10-CM | POA: Insufficient documentation

## 2011-02-14 DIAGNOSIS — M549 Dorsalgia, unspecified: Secondary | ICD-10-CM | POA: Insufficient documentation

## 2011-02-14 DIAGNOSIS — M129 Arthropathy, unspecified: Secondary | ICD-10-CM | POA: Insufficient documentation

## 2011-02-14 DIAGNOSIS — E039 Hypothyroidism, unspecified: Secondary | ICD-10-CM | POA: Insufficient documentation

## 2011-02-14 DIAGNOSIS — R1013 Epigastric pain: Secondary | ICD-10-CM

## 2011-02-14 DIAGNOSIS — M109 Gout, unspecified: Secondary | ICD-10-CM | POA: Insufficient documentation

## 2011-02-14 DIAGNOSIS — Z8673 Personal history of transient ischemic attack (TIA), and cerebral infarction without residual deficits: Secondary | ICD-10-CM | POA: Insufficient documentation

## 2011-02-14 DIAGNOSIS — Z86711 Personal history of pulmonary embolism: Secondary | ICD-10-CM | POA: Insufficient documentation

## 2011-02-14 DIAGNOSIS — E785 Hyperlipidemia, unspecified: Secondary | ICD-10-CM | POA: Insufficient documentation

## 2011-02-14 LAB — COMPREHENSIVE METABOLIC PANEL
AST: 33 U/L (ref 0–37)
Albumin: 3.1 g/dL — ABNORMAL LOW (ref 3.5–5.2)
Alkaline Phosphatase: 88 U/L (ref 39–117)
Chloride: 106 mEq/L (ref 96–112)
Potassium: 3.7 mEq/L (ref 3.5–5.1)
Sodium: 138 mEq/L (ref 135–145)
Total Bilirubin: 0.3 mg/dL (ref 0.3–1.2)
Total Protein: 6.8 g/dL (ref 6.0–8.3)

## 2011-02-14 LAB — CBC
MCHC: 34.9 g/dL (ref 30.0–36.0)
Platelets: 161 10*3/uL (ref 150–400)
RDW: 15.6 % — ABNORMAL HIGH (ref 11.5–15.5)
WBC: 6.4 10*3/uL (ref 4.0–10.5)

## 2011-02-14 MED ORDER — PANTOPRAZOLE SODIUM 40 MG IV SOLR
40.0000 mg | Freq: Once | INTRAVENOUS | Status: AC
  Administered 2011-02-14: 40 mg via INTRAVENOUS
  Filled 2011-02-14: qty 40

## 2011-02-14 MED ORDER — FAMOTIDINE IN NACL 20-0.9 MG/50ML-% IV SOLN
20.0000 mg | Freq: Once | INTRAVENOUS | Status: AC
  Administered 2011-02-14: 20 mg via INTRAVENOUS
  Filled 2011-02-14: qty 50

## 2011-02-14 MED ORDER — GI COCKTAIL ~~LOC~~
30.0000 mL | Freq: Once | ORAL | Status: AC
  Administered 2011-02-14: 30 mL via ORAL
  Filled 2011-02-14: qty 30

## 2011-02-14 NOTE — ED Notes (Signed)
Pt in c/o abd pain, states he has acid reflux, also c/o nausea

## 2011-02-14 NOTE — ED Notes (Signed)
MD at bedside. 

## 2011-02-14 NOTE — ED Notes (Signed)
Pt given PO challenge. Saltines and ginger ale.

## 2011-02-14 NOTE — ED Provider Notes (Signed)
History     CSN: [id number]  Arrival date & time 02/14/11  0202   First MD Initiated Contact with Patient 02/14/11 [phone]      Chief Complaint  Patient presents with  . Abdominal Pain    (Consider location/radiation/quality/duration/timing/severity/associated sxs/prior treatment) The history is provided by the patient.   pain after eating. Location epigastric region. No radiation. Sharp pain after eating foods. Has been on off for last 6 months. Patient has had endoscopy, CT scans, multiple evaluations, recent MRI of abdomen has seen multiple other physicians regarding his symptoms. He been doing well the last week or so until tonight. No new medications. No missed medications. No fevers or chills. He denies any heartburn or acid taste. Moderate in severity. No shortness of breath. No nausea or vomiting.  Past Medical History  Diagnosis Date  . GERD (gastroesophageal reflux disease)   . Hypothyroidism   . Renal disorder   . Chronic back pain   . Hx of heart artery stent   . Myocardial infarction   . Stroke   . Arthritis   . Gout   . Pulmonary embolism, bilateral 2010  . Hyperlipemia     Past Surgical History  Procedure Date  . Appendectomy   . Cardiac catheterization   . Coronary angioplasty with stent placement   . Esophagogastroduodenoscopy 12/22/2010    Procedure: ESOPHAGOGASTRODUODENOSCOPY (EGD);  Surgeon: Theda Belfast;  Location: [facility];  Service: Endoscopy;  Laterality: N/A;    History reviewed. No pertinent family history.  History  Substance Use Topics  . Smoking status: Former Games developer  . Smokeless tobacco: Not on file  . Alcohol Use: No      Review of Systems  Constitutional: Negative for fever and chills.  HENT: Negative for neck pain and neck stiffness.   Eyes: Negative for pain.  Respiratory: Negative for shortness of breath.   Cardiovascular: Negative for palpitations and leg swelling.  Gastrointestinal: Positive for abdominal pain.  Negative for diarrhea and constipation.  Genitourinary: Negative for dysuria.  Musculoskeletal: Negative for back pain.  Skin: Negative for rash.  Neurological: Negative for headaches.  All other systems reviewed and are negative.    Allergies  Review of patient's allergies indicates no known allergies.  Home Medications   Current Outpatient Rx  Name Route Sig Dispense Refill  . AMIODARONE HCL 200 MG PO TABS Oral Take 200 mg by mouth daily.     . BECLOMETHASONE DIPROPIONATE 80 MCG/ACT IN AERS Inhalation Inhale 1 puff into the lungs 2 (two) times daily as needed. For shortness of breath    . CARVEDILOL 6.25 MG PO TABS Oral Take 6.25 mg by mouth 2 (two) times daily with a meal.     . CLOPIDOGREL BISULFATE 75 MG PO TABS Oral Take 75 mg by mouth daily.      . DEXLANSOPRAZOLE 60 MG PO CPDR Oral Take 60 mg by mouth daily. For reflux    . FLUTICASONE PROPIONATE 50 MCG/ACT NA SUSP Nasal Place 2 sprays into the nose daily as needed. For stuffy nose and dryness    . FUROSEMIDE 40 MG PO TABS Oral Take 40 mg by mouth daily.     Marland Kitchen GLIMEPIRIDE 1 MG PO TABS Oral Take 1 mg by mouth daily before breakfast.     . ISOSORBIDE MONONITRATE ER 60 MG PO TB24 Oral Take 60 mg by mouth 2 (two) times daily.     Marland Kitchen LEVOTHYROXINE SODIUM 50 MCG PO TABS Oral Take 50 mcg by mouth  daily.     Marland Kitchen POLYETHYLENE GLYCOL 3350 PO PACK Oral Take 17 g by mouth daily as needed. For constipation    . RANITIDINE HCL 300 MG PO CAPS Oral Take 300 mg by mouth every evening.      Marland Kitchen ROSUVASTATIN CALCIUM 10 MG PO TABS Oral Take 10 mg by mouth daily.     Bernadette Hoit SODIUM 8.6-50 MG PO TABS Oral Take 1 tablet by mouth daily as needed. For constipation    . SPIRONOLACTONE 25 MG PO TABS Oral Take 25 mg by mouth daily.     Marland Kitchen TAMSULOSIN HCL 0.4 MG PO CAPS Oral Take 0.4 mg by mouth at bedtime.     Marland Kitchen NITROGLYCERIN 0.4 MG SL SUBL Sublingual Place 0.4 mg under the tongue every 5 (five) minutes as needed. For chest pain       BP 109/94   Pulse 62  Temp(Src) 98.6 F (37 C) (Oral)  Resp 18  SpO2 97%  Physical Exam  Constitutional: He is oriented to person, place, and time. He appears well-developed and well-nourished.  HENT:  Head: Normocephalic and atraumatic.  Eyes: Conjunctivae and EOM are normal. Pupils are equal, round, and reactive to light.  Neck: Trachea normal. Neck supple. No thyromegaly present.  Cardiovascular: Normal rate, regular rhythm, S1 normal, S2 normal and normal pulses.     No systolic murmur is present   No diastolic murmur is present  Pulses:      Radial pulses are 2+ on the right side, and 2+ on the left side.  Pulmonary/Chest: Effort normal and breath sounds normal. He has no wheezes. He has no rhonchi. He has no rales. He exhibits no tenderness.  Abdominal: Soft. Normal appearance and bowel sounds are normal. There is no tenderness. There is no CVA tenderness and negative Murphy's sign.  Musculoskeletal:       BLE:s Calves nontender, no cords or erythema, negative Homans sign  Neurological: He is alert and oriented to person, place, and time. He has normal strength. No cranial nerve deficit or sensory deficit. GCS eye subscore is 4. GCS verbal subscore is 5. GCS motor subscore is 6.  Skin: Skin is warm and dry. No rash noted. He is not diaphoretic.  Psychiatric: His speech is normal.       Cooperative and appropriate    ED Course  Procedures (including critical care time)   Labs Reviewed  CBC  I-STAT TROPONIN I  COMPREHENSIVE METABOLIC PANEL  LIPASE, BLOOD   Dg Chest Portable 1 View  02/14/2011  *RADIOLOGY REPORT*  Clinical Data: Epigastric abdominal pain.  PORTABLE CHEST - 1 VIEW  Comparison: Chest radiograph performed 01/27/2011  Findings: The lungs are well-aerated and clear.  Mild chronically increased interstitial markings are noted.  There is no evidence of pleural effusion or pneumothorax.  The cardiomediastinal silhouette is mildly enlarged; there is stable prominence of both  hila.  No acute osseous abnormalities are seen.  IMPRESSION: Mild cardiomegaly; no acute cardiopulmonary process seen.  Original Report Authenticated By: Tonia Ghent, M.D.    Date: 02/14/2011  Rate: 60  Rhythm: normal sinus rhythm  QRS Axis: normal  Intervals: normal  ST/T Wave abnormalities: nonspecific ST/T changes  Conduction Disutrbances:left bundle branch block  Narrative Interpretation:   Old EKG Reviewed: unchanged  Pepcid, Protonix, IV fluids. No reproducible abdominal tenderness to exams.  Records reviewed - recent CT and MR of ABD do not show obvious messenteric ischemia.   Recheck at 6:30 AM is resting comfortably and  feeling better. PO trial initiated.   MDM   Pain after eating.  medications as above. X-ray and labs obtained and reviewed. Patient feeling better on recheck and offered crackers and food and tolerates them well. Symptoms improved. Given multiple recent workups, do not feel patient requires admission at this time. Primary care referral and is stable for outpatient followup. Strict return precautions verbalized as understood.        Sunnie Nielsen, MD 02/14/11 [phone]

## 2011-02-14 NOTE — ED Notes (Signed)
Pt tolerated challenge well.

## 2011-02-16 ENCOUNTER — Emergency Department (HOSPITAL_COMMUNITY): Payer: Medicare Other

## 2011-02-16 ENCOUNTER — Encounter (HOSPITAL_COMMUNITY): Payer: Self-pay | Admitting: *Deleted

## 2011-02-16 ENCOUNTER — Other Ambulatory Visit: Payer: Self-pay

## 2011-02-16 ENCOUNTER — Inpatient Hospital Stay (HOSPITAL_COMMUNITY)
Admission: EM | Admit: 2011-02-16 | Discharge: 2011-02-21 | DRG: 287 | Disposition: A | Discharge status: Home / Self Care | Payer: Medicare Other | Source: Ambulatory Visit | Attending: Cardiology | Admitting: Cardiology

## 2011-02-16 ENCOUNTER — Emergency Department (HOSPITAL_COMMUNITY)
Admission: EM | Admit: 2011-02-16 | Discharge: 2011-02-16 | Disposition: A | Discharge status: Home / Self Care | Payer: Medicare Other | Source: Home / Self Care | Attending: Emergency Medicine | Admitting: Emergency Medicine

## 2011-02-16 DIAGNOSIS — I2510 Atherosclerotic heart disease of native coronary artery without angina pectoris: Principal | ICD-10-CM | POA: Diagnosis present

## 2011-02-16 DIAGNOSIS — G8929 Other chronic pain: Secondary | ICD-10-CM

## 2011-02-16 DIAGNOSIS — R079 Chest pain, unspecified: Secondary | ICD-10-CM | POA: Insufficient documentation

## 2011-02-16 DIAGNOSIS — I200 Unstable angina: Secondary | ICD-10-CM | POA: Diagnosis present

## 2011-02-16 DIAGNOSIS — E039 Hypothyroidism, unspecified: Secondary | ICD-10-CM | POA: Insufficient documentation

## 2011-02-16 DIAGNOSIS — K219 Gastro-esophageal reflux disease without esophagitis: Secondary | ICD-10-CM | POA: Diagnosis present

## 2011-02-16 DIAGNOSIS — Z79899 Other long term (current) drug therapy: Secondary | ICD-10-CM | POA: Insufficient documentation

## 2011-02-16 DIAGNOSIS — I252 Old myocardial infarction: Secondary | ICD-10-CM

## 2011-02-16 DIAGNOSIS — Z8673 Personal history of transient ischemic attack (TIA), and cerebral infarction without residual deficits: Secondary | ICD-10-CM | POA: Insufficient documentation

## 2011-02-16 DIAGNOSIS — M549 Dorsalgia, unspecified: Secondary | ICD-10-CM | POA: Diagnosis present

## 2011-02-16 DIAGNOSIS — Z86718 Personal history of other venous thrombosis and embolism: Secondary | ICD-10-CM

## 2011-02-16 DIAGNOSIS — I509 Heart failure, unspecified: Secondary | ICD-10-CM | POA: Diagnosis present

## 2011-02-16 DIAGNOSIS — Z9861 Coronary angioplasty status: Secondary | ICD-10-CM

## 2011-02-16 DIAGNOSIS — Z87891 Personal history of nicotine dependence: Secondary | ICD-10-CM

## 2011-02-16 DIAGNOSIS — I2589 Other forms of chronic ischemic heart disease: Secondary | ICD-10-CM | POA: Diagnosis present

## 2011-02-16 DIAGNOSIS — R1013 Epigastric pain: Secondary | ICD-10-CM | POA: Insufficient documentation

## 2011-02-16 DIAGNOSIS — N183 Chronic kidney disease, stage 3 unspecified: Secondary | ICD-10-CM | POA: Diagnosis present

## 2011-02-16 DIAGNOSIS — R112 Nausea with vomiting, unspecified: Secondary | ICD-10-CM | POA: Insufficient documentation

## 2011-02-16 DIAGNOSIS — I5022 Chronic systolic (congestive) heart failure: Secondary | ICD-10-CM | POA: Diagnosis present

## 2011-02-16 DIAGNOSIS — E7800 Pure hypercholesterolemia, unspecified: Secondary | ICD-10-CM | POA: Diagnosis present

## 2011-02-16 DIAGNOSIS — M109 Gout, unspecified: Secondary | ICD-10-CM | POA: Diagnosis present

## 2011-02-16 DIAGNOSIS — Z86711 Personal history of pulmonary embolism: Secondary | ICD-10-CM

## 2011-02-16 DIAGNOSIS — I129 Hypertensive chronic kidney disease with stage 1 through stage 4 chronic kidney disease, or unspecified chronic kidney disease: Secondary | ICD-10-CM | POA: Diagnosis present

## 2011-02-16 DIAGNOSIS — E785 Hyperlipidemia, unspecified: Secondary | ICD-10-CM | POA: Diagnosis present

## 2011-02-16 DIAGNOSIS — K5900 Constipation, unspecified: Secondary | ICD-10-CM | POA: Diagnosis present

## 2011-02-16 LAB — CBC
HCT: 34.4 % — ABNORMAL LOW (ref 39.0–52.0)
HCT: 35.6 % — ABNORMAL LOW (ref 39.0–52.0)
Hemoglobin: 11.9 g/dL — ABNORMAL LOW (ref 13.0–17.0)
Hemoglobin: 12.4 g/dL — ABNORMAL LOW (ref 13.0–17.0)
Hemoglobin: 11.6 g/dL — ABNORMAL LOW (ref 13.0–17.0)
MCH: 28.4 pg (ref 26.0–34.0)
MCH: 28.1 pg (ref 26.0–34.0)
MCHC: 34.6 g/dL (ref 30.0–36.0)
MCHC: 34.8 g/dL (ref 30.0–36.0)
MCHC: 34.6 g/dL (ref 30.0–36.0)
MCV: 81.1 fL (ref 78.0–100.0)
MCV: 81.7 fL (ref 78.0–100.0)
MCV: 81.1 fL (ref 78.0–100.0)
Platelets: 161 10*3/uL (ref 150–400)
RBC: 4.36 MIL/uL (ref 4.22–5.81)
RDW: 15.7 % — ABNORMAL HIGH (ref 11.5–15.5)
RDW: 15.5 % (ref 11.5–15.5)
WBC: 6.2 10*3/uL (ref 4.0–10.5)

## 2011-02-16 LAB — DIFFERENTIAL
Basophils Relative: 0 % (ref 0–1)
Eosinophils Absolute: 0.1 10*3/uL (ref 0.0–0.7)
Eosinophils Relative: 2 % (ref 0–5)
Lymphs Abs: 1.8 10*3/uL (ref 0.7–4.0)
Monocytes Absolute: 0.7 10*3/uL (ref 0.1–1.0)
Monocytes Relative: 12 % (ref 3–12)
Neutrophils Relative %: 53 % (ref 43–77)

## 2011-02-16 LAB — COMPREHENSIVE METABOLIC PANEL
Albumin: 3.2 g/dL — ABNORMAL LOW (ref 3.5–5.2)
Alkaline Phosphatase: 80 U/L (ref 39–117)
BUN: 17 mg/dL (ref 6–23)
Chloride: 107 mEq/L (ref 96–112)
Creatinine, Ser: 2.35 mg/dL — ABNORMAL HIGH (ref 0.50–1.35)
GFR calc Af Amer: 29 mL/min — ABNORMAL LOW (ref >90)
Glucose, Bld: 56 mg/dL — ABNORMAL LOW (ref 70–99)
Potassium: 4.2 mEq/L (ref 3.5–5.1)
Total Bilirubin: 0.3 mg/dL (ref 0.3–1.2)
Total Protein: 7 g/dL (ref 6.0–8.3)

## 2011-02-16 LAB — AMYLASE: Amylase: 187 U/L — ABNORMAL HIGH (ref 0–105)

## 2011-02-16 LAB — BASIC METABOLIC PANEL
BUN: 13 mg/dL (ref 6–23)
CO2: 22 mEq/L (ref 19–32)
Calcium: 9.3 mg/dL (ref 8.4–10.5)
Chloride: 109 mEq/L (ref 96–112)
Creatinine, Ser: 1.85 mg/dL — ABNORMAL HIGH (ref 0.50–1.35)
GFR calc Af Amer: 38 mL/min — ABNORMAL LOW (ref >90)
GFR calc non Af Amer: 33 mL/min — ABNORMAL LOW (ref >90)
Glucose, Bld: 78 mg/dL (ref 70–99)
Potassium: 4.4 mEq/L (ref 3.5–5.1)
Sodium: 141 mEq/L (ref 135–145)

## 2011-02-16 LAB — LIPASE, BLOOD: Lipase: 41 U/L (ref 11–59)

## 2011-02-16 LAB — PROTIME-INR
INR: 1.25 (ref 0.00–1.49)
Prothrombin Time: 16 seconds — ABNORMAL HIGH (ref 11.6–15.2)

## 2011-02-16 LAB — CARDIAC PANEL(CRET KIN+CKTOT+MB+TROPI)
CK, MB: 2.1 ng/mL (ref 0.3–4.0)
Relative Index: 1.6 (ref 0.0–2.5)
Total CK: 129 U/L (ref 7–232)
Troponin I: <0.3 ng/mL (ref <0.30)

## 2011-02-16 LAB — URINALYSIS, ROUTINE W REFLEX MICROSCOPIC
Bilirubin Urine: NEGATIVE
Ketones, ur: NEGATIVE mg/dL
Nitrite: NEGATIVE
Urobilinogen, UA: 0.2 mg/dL (ref 0.0–1.0)
pH: 5.5 (ref 5.0–8.0)

## 2011-02-16 LAB — TROPONIN I: Troponin I: <0.3 ng/mL (ref <0.30)

## 2011-02-16 LAB — GLUCOSE, CAPILLARY: Glucose-Capillary: 143 mg/dL — ABNORMAL HIGH (ref 70–99)

## 2011-02-16 MED ORDER — SODIUM CHLORIDE 0.9 % IV SOLN
Freq: Once | INTRAVENOUS | Status: AC
  Administered 2011-02-16: 20 mL/h via INTRAVENOUS

## 2011-02-16 MED ORDER — CARVEDILOL 6.25 MG PO TABS
6.2500 mg | ORAL_TABLET | Freq: Two times a day (BID) | ORAL | Status: DC
  Filled 2011-02-16 (×3): qty 1

## 2011-02-16 MED ORDER — AMIODARONE HCL 200 MG PO TABS
200.0000 mg | ORAL_TABLET | Freq: Every day | ORAL | Status: DC
  Administered 2011-02-17 – 2011-02-21 (×5): 200 mg via ORAL
  Filled 2011-02-16 (×5): qty 1

## 2011-02-16 MED ORDER — SPIRONOLACTONE 25 MG PO TABS
25.0000 mg | ORAL_TABLET | Freq: Every day | ORAL | Status: DC
  Administered 2011-02-17 – 2011-02-18 (×2): 25 mg via ORAL
  Filled 2011-02-16 (×2): qty 1

## 2011-02-16 MED ORDER — ONDANSETRON HCL 4 MG/2ML IJ SOLN
4.0000 mg | Freq: Four times a day (QID) | INTRAMUSCULAR | Status: DC | PRN
  Administered 2011-02-17 – 2011-02-19 (×3): 4 mg via INTRAVENOUS
  Filled 2011-02-16 (×3): qty 2

## 2011-02-16 MED ORDER — LEVOTHYROXINE SODIUM 50 MCG PO TABS
50.0000 ug | ORAL_TABLET | Freq: Every day | ORAL | Status: DC
Start: 2011-02-17 — End: 2011-02-21
  Administered 2011-02-17 – 2011-02-21 (×5): 50 ug via ORAL
  Filled 2011-02-16 (×5): qty 1

## 2011-02-16 MED ORDER — SUCRALFATE 1 G PO TABS
1.0000 g | ORAL_TABLET | Freq: Four times a day (QID) | ORAL | Status: DC
  Administered 2011-02-16 – 2011-02-21 (×18): 1 g via ORAL
  Filled 2011-02-16 (×21): qty 1

## 2011-02-16 MED ORDER — PANTOPRAZOLE SODIUM 20 MG PO TBEC: 20.0000 mg | DELAYED_RELEASE_TABLET | Freq: Every day | ORAL | Status: DC

## 2011-02-16 MED ORDER — FLUTICASONE PROPIONATE HFA 44 MCG/ACT IN AERO
1.0000 | INHALATION_SPRAY | Freq: Two times a day (BID) | RESPIRATORY_TRACT | Status: DC
  Administered 2011-02-16 – 2011-02-21 (×9): 1 via RESPIRATORY_TRACT
  Filled 2011-02-16: qty 10.6

## 2011-02-16 MED ORDER — ALPRAZOLAM 0.25 MG PO TABS
0.2500 mg | ORAL_TABLET | Freq: Two times a day (BID) | ORAL | Status: DC | PRN
  Administered 2011-02-18: 0.25 mg via ORAL
  Filled 2011-02-16: qty 1

## 2011-02-16 MED ORDER — HEPARIN SODIUM (PORCINE) 5000 UNIT/ML IJ SOLN
5000.0000 [IU] | Freq: Three times a day (TID) | INTRAMUSCULAR | Status: DC
  Administered 2011-02-16 – 2011-02-19 (×8): 5000 [IU] via SUBCUTANEOUS
  Filled 2011-02-16 (×11): qty 1

## 2011-02-16 MED ORDER — PANTOPRAZOLE SODIUM 40 MG PO TBEC
40.0000 mg | DELAYED_RELEASE_TABLET | Freq: Two times a day (BID) | ORAL | Status: DC
  Administered 2011-02-17 – 2011-02-21 (×9): 40 mg via ORAL
  Filled 2011-02-16 (×7): qty 1

## 2011-02-16 MED ORDER — SUCRALFATE 1 G PO TABS
1.0000 g | ORAL_TABLET | Freq: Once | ORAL | Status: AC
  Administered 2011-02-16: 1 g via ORAL
  Filled 2011-02-16: qty 1

## 2011-02-16 MED ORDER — DEXTROSE 50 % IV SOLN
50.0000 mL | Freq: Once | INTRAVENOUS | Status: AC
  Administered 2011-02-16: 50 mL via INTRAVENOUS
  Filled 2011-02-16: qty 50

## 2011-02-16 MED ORDER — NITROGLYCERIN 0.4 MG SL SUBL: 0.4000 mg | SUBLINGUAL_TABLET | SUBLINGUAL | Status: DC | PRN

## 2011-02-16 MED ORDER — ASPIRIN 81 MG PO CHEW
324.0000 mg | CHEWABLE_TABLET | ORAL | Status: AC
  Administered 2011-02-16: 324 mg via ORAL
  Filled 2011-02-16: qty 4

## 2011-02-16 MED ORDER — NITROGLYCERIN 2 % TD OINT
0.5000 [in_us] | TOPICAL_OINTMENT | Freq: Four times a day (QID) | TRANSDERMAL | Status: DC
  Administered 2011-02-16 – 2011-02-19 (×10): 0.5 [in_us] via TOPICAL
  Filled 2011-02-16: qty 30

## 2011-02-16 MED ORDER — SUCRALFATE 1 G PO TABS: 1.0000 g | ORAL_TABLET | Freq: Four times a day (QID) | ORAL | Status: DC

## 2011-02-16 MED ORDER — SODIUM CHLORIDE 0.9 % IV SOLN
INTRAVENOUS | Status: DC
  Administered 2011-02-17: via INTRAVENOUS

## 2011-02-16 MED ORDER — TAMSULOSIN HCL 0.4 MG PO CAPS
0.4000 mg | ORAL_CAPSULE | Freq: Every day | ORAL | Status: DC
  Administered 2011-02-17 – 2011-02-20 (×4): 0.4 mg via ORAL
  Filled 2011-02-16 (×5): qty 1

## 2011-02-16 MED ORDER — ACETAMINOPHEN 325 MG PO TABS: 650.0000 mg | ORAL_TABLET | ORAL | Status: DC | PRN

## 2011-02-16 MED ORDER — ASPIRIN EC 81 MG PO TBEC
81.0000 mg | DELAYED_RELEASE_TABLET | Freq: Every day | ORAL | Status: DC
  Administered 2011-02-17 – 2011-02-18 (×2): 81 mg via ORAL
  Filled 2011-02-16 (×2): qty 1

## 2011-02-16 MED ORDER — ROSUVASTATIN CALCIUM 10 MG PO TABS
10.0000 mg | ORAL_TABLET | Freq: Every day | ORAL | Status: DC
  Administered 2011-02-17 – 2011-02-21 (×5): 10 mg via ORAL
  Filled 2011-02-16 (×5): qty 1

## 2011-02-16 MED ORDER — ASPIRIN 300 MG RE SUPP
300.0000 mg | RECTAL | Status: AC
  Filled 2011-02-16: qty 1

## 2011-02-16 MED ORDER — PANTOPRAZOLE SODIUM 40 MG IV SOLR
40.0000 mg | Freq: Once | INTRAVENOUS | Status: AC
  Administered 2011-02-16: 40 mg via INTRAVENOUS
  Filled 2011-02-16: qty 40

## 2011-02-16 NOTE — ED Notes (Signed)
Patient resting and remains on monitor with 2L oxygen with saturation of 100%. Patient states his chest is hurting again.

## 2011-02-16 NOTE — ED Provider Notes (Signed)
  Hx: Pt has chronic abdominal pain, presents to the emergency department with pain spreading from his epigastric and suprapubic area. He states he is unsure where it hurts the most. He denies dysuria, diarrhea, rectal bleeding states that he had a bowel movement earlier in the day he denies distention of the abdomen. Patient has had multiple studies on his abdomen including MRI as well as other multiple studies including endoscopy which have not shown a definitive etiology for his pain. He currently takes ranitidine antacids and has minimal improvement.   Physical exam: Well appearing, no acute distress, oropharynx is clear mucous membranes are moist, as are clear without rales wheezing or rhonchi, no increased work of breathing, cardiac exam with very soft murmur but no tachycardia, No rubs no gallops. Pulses are strong peripherally at the radial arteries. Abdomen is soft, minimally tender in the epigastrium and the suprapubic area. There is no guarding, no rebound, no distention and no tympanic sounds to percussion. Lower strumming is without edema or rash.   Assessment: Chronic abdominal pain, overall benign appearing exam with normal white blood cell count, normal electrolytes, urinalysis without infection. Lactic acid pending otherwise plan discharge home with patient with increased antacid treatment with proton pump inhibitor and Carafate. Patient is agreeable to close followup with primary Dr. Lactic acid normal  Medical screening examination/treatment/procedure(s) were conducted as a shared visit with non-physician practitioner(s) and myself. I personally evaluated the patient during the encounter   Vida Roller, MD 02/16/11 [phone]

## 2011-02-16 NOTE — H&P (Signed)
[name] is an 78 y.o. male.   Chief Complaint: Chest pain/abdominal pain HPI: Patient is 78 year old male with past medical history significant for multiple medical problems i.e. coronary artery disease history of MI x2 in the past status post PCI to LAD left circumflex and RCA in the past ischemic cardiomyopathy history of congestive heart failure secondary to systolic dysfunction history of bilateral pulmonary embolism in the past history of paroxysmal atrial fibrillation hypertension non-insulin-dependent diabetes mellitus hypercholesteremia history of DVT remote tobacco abuse chronic kidney disease stage III hypothyroidism history of pancreatitis in the past GERD he came to the ER complaining of the left-sided chest pain radiating to left shoulder and left arm off and on for last few weeks patient describes the chest pain is dull aching pressure grade 5/10 associated with nausea and abdominal pain. Patient was seen in ER at [facility] earlier this morning with vague recurrent abdominal pain had the x-rays and labs which were all negative and was sent home patient had extensive workup for abdominal pain including MRI of the abdominal to evaluate for bowel ischemia and negative patient denies any urinary complaints denies fever or chills denies cough body ache. Patient had multiple ER visits her for similar complaints in the past. Patient denies any palpitation lightheadedness or syncope denies pain the orthopnea leg swelling EKG done in the ER showed normal sinus rhythm with left bundle branch block which was old.  Past Medical History  Diagnosis Date  . GERD (gastroesophageal reflux disease)   . Hypothyroidism   . Renal disorder   . Chronic back pain   . Hx of heart artery stent   . Myocardial infarction   . Stroke   . Arthritis   . Gout   . Pulmonary embolism, bilateral 2010  . Hyperlipemia     Past Surgical History  Procedure Date  . Appendectomy   . Cardiac catheterization    . Coronary angioplasty with stent placement   . Esophagogastroduodenoscopy 12/22/2010    Procedure: ESOPHAGOGASTRODUODENOSCOPY (EGD);  Surgeon: Theda Belfast;  Location: [facility];  Service: Endoscopy;  Laterality: N/A;    History reviewed. No pertinent family history. Social History:  reports that he has quit smoking. He does not have any smokeless tobacco history on file. He reports that he does not drink alcohol or use illicit drugs.  Allergies: No Known Allergies  Medications Prior to Admission  Medication Dose Route Frequency Provider Last Rate Last Dose  . 0.9 %  sodium chloride infusion   Intravenous Once Arman Filter, NP 20 mL/hr at 02/16/11 0248 20 mL/hr at 02/16/11 0248  . dextrose 50 % solution 50 mL  50 mL Intravenous Once Arman Filter, NP   50 mL at 02/16/11 0357  . pantoprazole (PROTONIX) injection 40 mg  40 mg Intravenous Once Arman Filter, NP   40 mg at 02/16/11 0248  . sucralfate (CARAFATE) tablet 1 g  1 g Oral Once Arman Filter, NP   1 g at 02/16/11 0248   Medications Prior to Admission  Medication Sig Dispense Refill  . amiodarone (PACERONE) 200 MG tablet Take 200 mg by mouth daily.       . beclomethasone (QVAR) 80 MCG/ACT inhaler Inhale 1 puff into the lungs 2 (two) times daily as needed. For shortness of breath      . carvedilol (COREG) 6.25 MG tablet Take 6.25 mg by mouth 2 (two) times daily with a meal.       . clopidogrel (  PLAVIX) 75 MG tablet Take 75 mg by mouth daily.        Marland Kitchen dexlansoprazole (DEXILANT) 60 MG capsule Take 60 mg by mouth daily. For reflux      . fluticasone (FLONASE) 50 MCG/ACT nasal spray Place 2 sprays into the nose daily as needed. For stuffy nose and dryness      . furosemide (LASIX) 40 MG tablet Take 40 mg by mouth daily.       Marland Kitchen glimepiride (AMARYL) 1 MG tablet Take 1 mg by mouth daily before breakfast.       . isosorbide mononitrate (IMDUR) 60 MG 24 hr tablet Take 60 mg by mouth 2 (two) times daily.       Marland Kitchen levothyroxine  (SYNTHROID, LEVOTHROID) 50 MCG tablet Take 50 mcg by mouth daily.       . nitroGLYCERIN (NITROSTAT) 0.4 MG SL tablet Place 0.4 mg under the tongue every 5 (five) minutes as needed. For chest pain       . polyethylene glycol (MIRALAX / GLYCOLAX) packet Take 17 g by mouth daily as needed. For constipation      . rosuvastatin (CRESTOR) 10 MG tablet Take 10 mg by mouth daily.       Marland Kitchen senna-docusate (SENOKOT-S) 8.6-50 MG per tablet Take 1 tablet by mouth daily as needed. For constipation      . spironolactone (ALDACTONE) 25 MG tablet Take 25 mg by mouth daily.       . Tamsulosin HCl (FLOMAX) 0.4 MG CAPS Take 0.4 mg by mouth at bedtime.       . ranitidine (ZANTAC) 300 MG capsule Take 300 mg by mouth every evening.          Results for orders placed during the hospital encounter of 02/16/11 (from the past 48 hour(s))  CARDIAC PANEL(CRET KIN+CKTOT+MB+TROPI)     Status: Normal   Collection Time   02/16/11  4:33 PM      Component Value Range Comment   Total CK 129  7 - 232 (U/L)    CK, MB 2.1  0.3 - 4.0 (ng/mL)    Troponin I <0.30  <0.30 (ng/mL)    Relative Index 1.6  0.0 - 2.5    CBC     Status: Abnormal   Collection Time   02/16/11  4:33 PM      Component Value Range Comment   WBC 6.2  4.0 - 10.5 (K/uL)    RBC 4.36  4.22 - 5.81 (MIL/uL)    Hemoglobin 12.4 (*) 13.0 - 17.0 (g/dL)    HCT 27.2 (*) 53.6 - 52.0 (%)    MCV 81.7  78.0 - 100.0 (fL)    MCH 28.4  26.0 - 34.0 (pg)    MCHC 34.8  30.0 - 36.0 (g/dL)    RDW 64.4  03.4 - 74.2 (%)    Platelets 161  150 - 400 (K/uL)   BASIC METABOLIC PANEL     Status: Abnormal   Collection Time   02/16/11  4:33 PM      Component Value Range Comment   Sodium 141  135 - 145 (mEq/L)    Potassium 4.4  3.5 - 5.1 (mEq/L)    Chloride 109  96 - 112 (mEq/L)    CO2 22  19 - 32 (mEq/L)    Glucose, Bld 78  70 - 99 (mg/dL)    BUN 13  6 - 23 (mg/dL)    Creatinine, Ser 5.95 (*) 0.50 - 1.35 (mg/dL)  Calcium 9.3  8.4 - 10.5 (mg/dL)    GFR calc non Af Amer 33 (*) >90  (mL/min)    GFR calc Af Amer 38 (*) >90 (mL/min)    Dg Chest 2 View  02/16/2011  *RADIOLOGY REPORT*  Clinical Data: Chest pain for 8 months  CHEST - 2 VIEW  Comparison: Chest radiograph 02/14/2011  Findings: Normal mediastinum and cardiac silhouette.  Lungs are hyperinflated.  No effusion, infiltrate, pneumothorax.  IMPRESSION:  1.  No acute cardiopulmonary process. 2.  Hyperinflated lungs.  Original Report Authenticated By: Genevive Bi, M.D.    Review of Systems  Constitutional: Negative for fever, chills and weight loss.  HENT: Negative for hearing loss and neck pain.   Eyes: Negative for blurred vision and double vision.  Respiratory: Negative for cough, hemoptysis and sputum production.   Cardiovascular: Positive for chest pain. Negative for palpitations, orthopnea, claudication and leg swelling.  Gastrointestinal: Positive for nausea, vomiting and abdominal pain. Negative for heartburn.  Genitourinary: Negative for dysuria and urgency.  Musculoskeletal: Negative for myalgias.  Skin: Negative for itching and rash.  Neurological: Negative for dizziness, tingling and headaches.    Blood pressure 142/70, pulse 60, temperature 98.5 F (36.9 C), temperature source Oral, resp. rate 15, SpO2 100.00%. Physical Exam  Constitutional: He is oriented to person, place, and time. He appears well-developed. No distress.  HENT:  Head: Normocephalic and atraumatic.  Mouth/Throat: No oropharyngeal exudate.  Eyes: Conjunctivae are normal. Left eye exhibits no discharge. No scleral icterus.  Neck: Normal range of motion. Neck supple. No JVD present. No thyromegaly present.  Cardiovascular: Normal rate and regular rhythm.        S1-S2 was normal there was soft systolic murmur no S3 gallop  Respiratory: Effort normal and breath sounds normal. No respiratory distress. He has no wheezes. He has no rales.  GI: Soft. Bowel sounds are normal. He exhibits no distension and no mass. There is tenderness.  There is no rebound and no guarding.  Musculoskeletal: Normal range of motion. He exhibits no edema and no tenderness.  Lymphadenopathy:    He has no cervical adenopathy.  Neurological: He is alert and oriented to person, place, and time.     Assessment/Plan Recurrent chest pain rule out progression of CAD rule out neo atherosclerosis rule out MI Abdominal pain etiology unclear Coronary artery disease history of MI x2 in the past Ischemic cardiomyopathy Compensated systolic heart failure Status post paroxysmal atrial fibrillation History of bilateral pulmonary embolism History of DVT Hypertension Non-insulin-dependent diabetes mellitus Hypercholesteremia Chronic kidney disease stage III Anemia Hypothyroidism Plan As per orders Schedule for nuclear stress test in a.m. Review of old angiograms  Elle Vezina N 02/16/2011, 9:08 PM

## 2011-02-16 NOTE — ED Notes (Signed)
Admitting MD at bedside.

## 2011-02-16 NOTE — ED Notes (Signed)
Paged IV Team.  

## 2011-02-16 NOTE — ED Notes (Signed)
Went to discharge pt. Pt states that he does not have a ride home. Pt states that he still feels sick. Pt states that he does not know anybody to pick him up. EDP aware and spoke with pt. Pt cardiologist called for consult since pt has been in and out of hospital this week at [facility]. Pt aware of consult and secretary placed consult.

## 2011-02-16 NOTE — ED Notes (Signed)
[id number]<AV> Expected date:02/16/11<BR> Expected time: 1:40 AM<BR> Means of arrival:Ambulance<BR> Comments:<BR> abd pain

## 2011-02-16 NOTE — ED Notes (Signed)
Pt reports he will attempt to call a ride at present.

## 2011-02-16 NOTE — ED Notes (Signed)
Attempted to call report nurse unable to take at this time

## 2011-02-16 NOTE — ED Notes (Signed)
Care of pt assumed. Pt sts he called his son to pick him up but he had to go to work, and hasn't been able to get a hold of his daughter. Pt encouraged to call other contacts to find a ride home. Will continue to monitor and consult with charge RN as needed if pt unable to find ride.

## 2011-02-16 NOTE — ED Provider Notes (Signed)
History     CSN: [id number]  Arrival date & time 02/16/11  1539   First MD Initiated Contact with Patient 02/16/11 1540      Chief Complaint  Patient presents with  . Chest Pain    (Consider location/radiation/quality/duration/timing/severity/associated sxs/prior treatment)  Patient is a 78 y.o. male presenting with chest pain. The history is provided by the patient.  Chest Pain   78 year old AA male who presents with chest pain. He was seen at [facility] ED this morning for chronic abdominal pain. He states he has had the chest pain on and off since April of 2012. He describes it as a dull ache and pressure that is located on his L chest and radiates to his L shoulder. He reports it as 4-5/10. He is also complaining of nausea and epigastric abdominal pain. He has had 1 episode of emesis since arrival. He denies palpitations and lower leg swelling. He also reports some dyspnea. The patient denies cough, back pain, HA, or weakness.   Past Medical History  Diagnosis Date  . GERD (gastroesophageal reflux disease)   . Hypothyroidism   . Renal disorder   . Chronic back pain   . Hx of heart artery stent   . Myocardial infarction   . Stroke   . Arthritis   . Gout   . Pulmonary embolism, bilateral 2010  . Hyperlipemia     Past Surgical History  Procedure Date  . Appendectomy   . Cardiac catheterization   . Coronary angioplasty with stent placement   . Esophagogastroduodenoscopy 12/22/2010    Procedure: ESOPHAGOGASTRODUODENOSCOPY (EGD);  Surgeon: Theda Belfast;  Location: [facility];  Service: Endoscopy;  Laterality: N/A;    History reviewed. No pertinent family history.  History  Substance Use Topics  . Smoking status: Former Games developer  . Smokeless tobacco: Not on file  . Alcohol Use: No      Review of Systems  Cardiovascular: Positive for chest pain.  All pertinent positives and negatives reviewed in the history of present illness  Allergies  Review of  patient's allergies indicates no known allergies.  Home Medications   Current Outpatient Rx  Name Route Sig Dispense Refill  . ALBUTEROL SULFATE HFA 108 (90 BASE) MCG/ACT IN AERS Inhalation Inhale 2 puffs into the lungs every 6 (six) hours as needed.    . AMIODARONE HCL 200 MG PO TABS Oral Take 200 mg by mouth daily.     . BECLOMETHASONE DIPROPIONATE 80 MCG/ACT IN AERS Inhalation Inhale 1 puff into the lungs 2 (two) times daily as needed. For shortness of breath    . CARVEDILOL 6.25 MG PO TABS Oral Take 6.25 mg by mouth 2 (two) times daily with a meal.     . CLOPIDOGREL BISULFATE 75 MG PO TABS Oral Take 75 mg by mouth daily.      . DEXLANSOPRAZOLE 60 MG PO CPDR Oral Take 60 mg by mouth daily. For reflux    . FLUTICASONE PROPIONATE 50 MCG/ACT NA SUSP Nasal Place 2 sprays into the nose daily as needed. For stuffy nose and dryness    . FUROSEMIDE 40 MG PO TABS Oral Take 40 mg by mouth daily.     Marland Kitchen GLIMEPIRIDE 1 MG PO TABS Oral Take 1 mg by mouth daily before breakfast.     . ISOSORBIDE MONONITRATE ER 60 MG PO TB24 Oral Take 60 mg by mouth 2 (two) times daily.     Marland Kitchen LEVOTHYROXINE SODIUM 50 MCG PO TABS Oral  Take 50 mcg by mouth daily.     Marland Kitchen NITROGLYCERIN 0.4 MG SL SUBL Sublingual Place 0.4 mg under the tongue every 5 (five) minutes as needed. For chest pain     . PANTOPRAZOLE SODIUM 20 MG PO TBEC Oral Take 1 tablet (20 mg total) by mouth daily. 30 tablet 1  . POLYETHYLENE GLYCOL 3350 PO PACK Oral Take 17 g by mouth daily as needed. For constipation    . RANITIDINE HCL 300 MG PO CAPS Oral Take 300 mg by mouth every evening.      Marland Kitchen ROSUVASTATIN CALCIUM 10 MG PO TABS Oral Take 10 mg by mouth daily.     Bernadette Hoit SODIUM 8.6-50 MG PO TABS Oral Take 1 tablet by mouth daily as needed. For constipation    . SPIRONOLACTONE 25 MG PO TABS Oral Take 25 mg by mouth daily.     . SUCRALFATE 1 G PO TABS Oral Take 1 tablet (1 g total) by mouth 4 (four) times daily. 40 tablet 1  . TAMSULOSIN HCL 0.4 MG  PO CAPS Oral Take 0.4 mg by mouth at bedtime.       BP 176/84  Temp(Src) 98.5 F (36.9 C) (Oral)  Resp 23  SpO2 100%  Physical Exam  Constitutional: He is oriented to person, place, and time. He appears well-developed and well-nourished. No distress.  HENT:  Head: Normocephalic and atraumatic.  Neck: Normal range of motion. Neck supple. No JVD present.  Cardiovascular: Normal rate, regular rhythm, normal heart sounds and intact distal pulses.   Pulmonary/Chest: Effort normal and breath sounds normal. No respiratory distress. He has no wheezes. He has no rales. He exhibits no tenderness.  Abdominal: Soft. Bowel sounds are normal. He exhibits no distension. There is no tenderness. There is no guarding.  Musculoskeletal: Normal range of motion.  Neurological: He is alert and oriented to person, place, and time.  Skin: Skin is warm and dry. He is not diaphoretic.  Psychiatric: He has a normal mood and affect. His behavior is normal.    ED Course  Procedures (including critical care time)  Results for orders placed during the hospital encounter of 02/16/11  CARDIAC PANEL(CRET KIN+CKTOT+MB+TROPI)      Component Value Range   Total CK 129  7 - 232 (U/L)   CK, MB 2.1  0.3 - 4.0 (ng/mL)   Troponin I <0.30  <0.30 (ng/mL)   Relative Index 1.6  0.0 - 2.5   CBC      Component Value Range   WBC 6.2  4.0 - 10.5 (K/uL)   RBC 4.36  4.22 - 5.81 (MIL/uL)   Hemoglobin 12.4 (*) 13.0 - 17.0 (g/dL)   HCT 40.9 (*) 81.1 - 52.0 (%)   MCV 81.7  78.0 - 100.0 (fL)   MCH 28.4  26.0 - 34.0 (pg)   MCHC 34.8  30.0 - 36.0 (g/dL)   RDW 91.4  78.2 - 95.6 (%)   Platelets 161  150 - 400 (K/uL)  BASIC METABOLIC PANEL      Component Value Range   Sodium 141  135 - 145 (mEq/L)   Potassium 4.4  3.5 - 5.1 (mEq/L)   Chloride 109  96 - 112 (mEq/L)   CO2 22  19 - 32 (mEq/L)   Glucose, Bld 78  70 - 99 (mg/dL)   BUN 13  6 - 23 (mg/dL)   Creatinine, Ser 2.13 (*) 0.50 - 1.35 (mg/dL)   Calcium 9.3  8.4 - 08.6  (mg/dL)  GFR calc non Af Amer 33 (*) >90 (mL/min)   GFR calc Af Amer 38 (*) >90 (mL/min)       MDM   Date: 02/16/2011  Rate: 59  Rhythm: normal sinus rhythm  QRS Axis: normal  Intervals: normal  ST/T Wave abnormalities: normal  Conduction Disutrbances:left bundle branch block  Narrative Interpretation:   Old EKG Reviewed: unchanged    Patient has multiple visits to the ED this month. He seems to be really worried about his health. He was seen this morning and this evening for abdominal and chest pain. His CXR and ECG was negative. His cardiac enzymes have been negative since this am. All of his lab results are WNL or improving.  Spoke with his doctor about admission to the hospital.  Medical screening examination/treatment/procedure(s) were conducted as a shared visit with non-physician practitioner(s) and myself.  I personally evaluated the patient during the encounter 78 year old man complains of epigastric and chest pain.  He has had complete evaluation of his epigastric pain, with upper endoscopy, CT of abdomen/pelvis, and MRI of abdomen/pelvis.  He has had repeated EKG's and cardiac enzyme testing, all negative. Exam shows him to have a depressed affect.  Heart sounds normal, lungs clear, abdomen soft and non-tender.  Pt insists on being admitted.  I spoke with Dr. Sharyn Lull, his cardiologist and PCP, who will see pt. Osvaldo Human, M.D.    Carlyle Dolly, PA-C   02/16/11 1911  Carlyle Dolly, PA-C 02/16/11 2145  Carleene Cooper III, MD 02/17/11 1044

## 2011-02-16 NOTE — ED Notes (Signed)
Pt reports generalized abd pain since May 2012 - pt states pain is worse s/p eating and feels like the pain radiates towards his rectum. Pt denies n/v however self-induces vomiting in attempt to alleviate abd discomfort. Pt LNBM 02/15/11. Generalized abd tenderness to palpation.

## 2011-02-16 NOTE — ED Notes (Signed)
Per EMS - Patient was at [facility] earlier today with abdominal pain and did not tell them about chest pain. Patient returned home and called EMS for chest pain. BP 218/114 HR 64 and last BP189/92 HR58.  EMS gave 324mg  aspirin and 1 sublingual nitro. 12 lead shows left bundle branch block.

## 2011-02-16 NOTE — ED Provider Notes (Signed)
4:05 PM  Date: 02/16/2011  Rate: 59  Rhythm: normal sinus rhythm  QRS Axis: normal  Intervals: normal QRS:  Left bundle branch block  ST/T Wave abnormalities: normal  Conduction Disutrbances:left bundle branch block  Narrative Interpretation: Abnormal EKG  Old EKG Reviewed: unchanged    Carleene Cooper III, MD 02/16/11 1606

## 2011-02-16 NOTE — ED Notes (Signed)
IV Team returned pages and on way to [facility].

## 2011-02-16 NOTE — ED Notes (Signed)
IV team to bedside. 

## 2011-02-16 NOTE — ED Notes (Signed)
Patient states he has had chest pain since April but became worse today. Patient multiple MI with last MI in May 2012. Patient was seen at [facility] earlier today to abdominal pain. Patient states abdominal pains x 5 months. Patient states once home chest pain became much worse. Patient vomiting on arrival to this ED. Nausea x 2 days. Patient states he has left arm pain and his is still having abdomen pain. Patient placed on monitor and 2L oxygen with saturation of 100%. NAD at this time. Patient appears to be resting easier with the oxygen.

## 2011-02-16 NOTE — ED Notes (Signed)
Patient resting with NAD at this time. Patient remains on monitor and 2L oxygen with saturation of 100%.

## 2011-02-16 NOTE — ED Notes (Signed)
2034-01 Ready 

## 2011-02-16 NOTE — ED Notes (Signed)
Pt reports generalized abd pain x6 months - pt states he has been to his PCP for this and "can not find anything wrong," pt LNBM tonight - pt has drank prune juice today to assist in elimination.

## 2011-02-16 NOTE — ED Provider Notes (Signed)
History     CSN: [id number]  Arrival date & time 02/16/11  0202   First MD Initiated Contact with Patient 02/16/11 0205      Chief Complaint  Patient presents with  . Abdominal Pain    (Consider location/radiation/quality/duration/timing/severity/associated sxs/prior treatment) HPI Comments: [name] reports that he's had chronic epigastric for approximately 10 months, he has been seen by his primary care physician, Dr. Particia Jasper Dr. Elnoria Howard from GI, he has been prescribed numerous medications, has had numerous studies none of which is giving him an answer to why he is having this discomfort.  He describes the pain as starting in his epigastric area, radiating to his throat with a burning sensation that radiated into his left chest and down his left arm.  He also reports chronic constipation  Patient is a 78 y.o. male presenting with abdominal pain. The history is provided by the patient.  Abdominal Pain The primary symptoms of the illness include abdominal pain, nausea and vomiting. The primary symptoms of the illness do not include fever or dysuria. The onset of the illness was gradual.  The abdominal pain began more than 2 days ago. The abdominal pain has been gradually worsening since its onset. The abdominal pain is located in the epigastric region. The abdominal pain radiates to the chest. The severity of the abdominal pain is 4/10. The abdominal pain is relieved by nothing.  Nausea began more than 1 week ago.  The patient has not had a change in bowel habit. Additional symptoms associated with the illness include constipation. Symptoms associated with the illness do not include diaphoresis. Significant associated medical issues include GERD and cardiac disease.    Past Medical History  Diagnosis Date  . GERD (gastroesophageal reflux disease)   . Hypothyroidism   . Renal disorder   . Chronic back pain   . Hx of heart artery stent   . Myocardial infarction   . Stroke   . Arthritis    . Gout   . Pulmonary embolism, bilateral 2010  . Hyperlipemia     Past Surgical History  Procedure Date  . Appendectomy   . Cardiac catheterization   . Coronary angioplasty with stent placement   . Esophagogastroduodenoscopy 12/22/2010    Procedure: ESOPHAGOGASTRODUODENOSCOPY (EGD);  Surgeon: Theda Belfast;  Location: [facility];  Service: Endoscopy;  Laterality: N/A;    History reviewed. No pertinent family history.  History  Substance Use Topics  . Smoking status: Former Games developer  . Smokeless tobacco: Not on file  . Alcohol Use: No      Review of Systems  Constitutional: Negative for fever and diaphoresis.  HENT: Negative for congestion.   Respiratory: Negative for cough.   Cardiovascular: Positive for chest pain.  Gastrointestinal: Positive for nausea, vomiting, abdominal pain and constipation. Negative for blood in stool and abdominal distention.  Genitourinary: Negative for dysuria.  Musculoskeletal: Negative for myalgias.  Neurological: Negative for dizziness, weakness and numbness.    Allergies  Review of patient's allergies indicates no known allergies.  Home Medications   Current Outpatient Rx  Name Route Sig Dispense Refill  . ALBUTEROL SULFATE HFA 108 (90 BASE) MCG/ACT IN AERS Inhalation Inhale 2 puffs into the lungs every 6 (six) hours as needed.    . AMIODARONE HCL 200 MG PO TABS Oral Take 200 mg by mouth daily.     . BECLOMETHASONE DIPROPIONATE 80 MCG/ACT IN AERS Inhalation Inhale 1 puff into the lungs 2 (two) times daily as needed. For shortness of  breath    . CARVEDILOL 6.25 MG PO TABS Oral Take 6.25 mg by mouth 2 (two) times daily with a meal.     . CLOPIDOGREL BISULFATE 75 MG PO TABS Oral Take 75 mg by mouth daily.      . DEXLANSOPRAZOLE 60 MG PO CPDR Oral Take 60 mg by mouth daily. For reflux    . FLUTICASONE PROPIONATE 50 MCG/ACT NA SUSP Nasal Place 2 sprays into the nose daily as needed. For stuffy nose and dryness    . FUROSEMIDE 40 MG PO  TABS Oral Take 40 mg by mouth daily.     Marland Kitchen GLIMEPIRIDE 1 MG PO TABS Oral Take 1 mg by mouth daily before breakfast.     . ISOSORBIDE MONONITRATE ER 60 MG PO TB24 Oral Take 60 mg by mouth 2 (two) times daily.     Marland Kitchen LEVOTHYROXINE SODIUM 50 MCG PO TABS Oral Take 50 mcg by mouth daily.     Marland Kitchen POLYETHYLENE GLYCOL 3350 PO PACK Oral Take 17 g by mouth daily as needed. For constipation    . RANITIDINE HCL 300 MG PO CAPS Oral Take 300 mg by mouth every evening.      Marland Kitchen ROSUVASTATIN CALCIUM 10 MG PO TABS Oral Take 10 mg by mouth daily.     Bernadette Hoit SODIUM 8.6-50 MG PO TABS Oral Take 1 tablet by mouth daily as needed. For constipation    . SPIRONOLACTONE 25 MG PO TABS Oral Take 25 mg by mouth daily.     Marland Kitchen TAMSULOSIN HCL 0.4 MG PO CAPS Oral Take 0.4 mg by mouth at bedtime.     Marland Kitchen NITROGLYCERIN 0.4 MG SL SUBL Sublingual Place 0.4 mg under the tongue every 5 (five) minutes as needed. For chest pain       BP 124/64  Pulse 56  Temp(Src) 97.9 F (36.6 C) (Oral)  Resp 18  SpO2 100%  Physical Exam  Constitutional: He is oriented to person, place, and time. He appears well-developed and well-nourished.  Eyes: Pupils are equal, round, and reactive to light.  Neck: Normal range of motion.  Abdominal: Soft. He exhibits no distension. There is no tenderness.  Musculoskeletal: He exhibits no edema.  Neurological: He is alert and oriented to person, place, and time.  Skin: Skin is warm and dry. No rash noted.    ED Course  Procedures (including critical care time)  Labs Reviewed  CBC - Abnormal; Notable for the following:    Hemoglobin 11.9 (*)    HCT 34.4 (*)    RDW 15.7 (*)    All other components within normal limits  COMPREHENSIVE METABOLIC PANEL - Abnormal; Notable for the following:    Glucose, Bld 56 (*)    Creatinine, Ser 2.35 (*)    Albumin 3.2 (*)    GFR calc non Af Amer 25 (*)    GFR calc Af Amer 29 (*)    All other components within normal limits  GLUCOSE, CAPILLARY -  Abnormal; Notable for the following:    Glucose-Capillary 143 (*)    All other components within normal limits  TROPONIN I  LACTIC ACID, PLASMA   No results found.   1. Chronic epigastric pain     ED ECG REPORT   Date: 02/16/2011  EKG Time: 6:02 AM  Rate: 56  Rhythm: normal sinus rhythm,  normal EKG, normal sinus rhythm, unchanged from previous tracings  Axis: normal  Intervals:none  ST&T Change: L atrial abnormality  Narrative Interpretation: LBBB  MDM  Chronic epigastric pain with        Arman Filter, NP 02/16/11 0602

## 2011-02-16 NOTE — ED Notes (Signed)
Pt not complaining of Chest pain at this time. Pt given meal

## 2011-02-17 ENCOUNTER — Inpatient Hospital Stay (HOSPITAL_COMMUNITY): Payer: Medicare Other

## 2011-02-17 ENCOUNTER — Other Ambulatory Visit: Payer: Self-pay

## 2011-02-17 LAB — CARDIAC PANEL(CRET KIN+CKTOT+MB+TROPI)
CK, MB: 2.2 ng/mL (ref 0.3–4.0)
CK, MB: 2 ng/mL (ref 0.3–4.0)
Total CK: 95 U/L (ref 7–232)
Troponin I: <0.3 ng/mL (ref <0.30)
Troponin I: <0.3 ng/mL (ref <0.30)
Troponin I: <0.3 ng/mL (ref <0.30)

## 2011-02-17 LAB — BASIC METABOLIC PANEL
BUN: 11 mg/dL (ref 6–23)
Chloride: 113 mEq/L — ABNORMAL HIGH (ref 96–112)
GFR calc Af Amer: 41 mL/min — ABNORMAL LOW (ref >90)
GFR calc non Af Amer: 35 mL/min — ABNORMAL LOW (ref >90)
Potassium: 4 mEq/L (ref 3.5–5.1)

## 2011-02-17 LAB — COMPREHENSIVE METABOLIC PANEL
BUN: 11 mg/dL (ref 6–23)
CO2: 24 mEq/L (ref 19–32)
Calcium: 9.3 mg/dL (ref 8.4–10.5)
Creatinine, Ser: 1.8 mg/dL — ABNORMAL HIGH (ref 0.50–1.35)
GFR calc Af Amer: 40 mL/min — ABNORMAL LOW (ref >90)
GFR calc non Af Amer: 34 mL/min — ABNORMAL LOW (ref >90)
Glucose, Bld: 125 mg/dL — ABNORMAL HIGH (ref 70–99)
Sodium: 141 mEq/L (ref 135–145)
Total Protein: 6 g/dL (ref 6.0–8.3)

## 2011-02-17 LAB — CBC
HCT: 34.8 % — ABNORMAL LOW (ref 39.0–52.0)
MCHC: 34.2 g/dL (ref 30.0–36.0)
Platelets: 159 10*3/uL (ref 150–400)
RDW: 15.8 % — ABNORMAL HIGH (ref 11.5–15.5)
WBC: 4.7 10*3/uL (ref 4.0–10.5)

## 2011-02-17 LAB — LIPID PANEL
Cholesterol: 131 mg/dL (ref 0–200)
HDL: 57 mg/dL (ref >39)
Total CHOL/HDL Ratio: 2.3 RATIO

## 2011-02-17 LAB — HEMOGLOBIN A1C
Hgb A1c MFr Bld: 6.3 % — ABNORMAL HIGH (ref <5.7)
Mean Plasma Glucose: 134 mg/dL — ABNORMAL HIGH (ref <117)

## 2011-02-17 LAB — MAGNESIUM: Magnesium: 1.8 mg/dL (ref 1.5–2.5)

## 2011-02-17 MED ORDER — TECHNETIUM TC 99M TETROFOSMIN IV KIT
10.0000 | PACK | Freq: Once | INTRAVENOUS | Status: AC | PRN
  Administered 2011-02-17: 10 via INTRAVENOUS

## 2011-02-17 MED ORDER — REGADENOSON 0.4 MG/5ML IV SOLN
0.4000 mg | Freq: Once | INTRAVENOUS | Status: AC
  Administered 2011-02-17: 0.4 mg via INTRAVENOUS
  Filled 2011-02-17: qty 5

## 2011-02-17 MED ORDER — TECHNETIUM TC 99M TETROFOSMIN IV KIT
30.0000 | PACK | Freq: Once | INTRAVENOUS | Status: AC | PRN
  Administered 2011-02-17: 30 via INTRAVENOUS

## 2011-02-17 MED ORDER — ALUM & MAG HYDROXIDE-SIMETH 200-200-20 MG/5ML PO SUSP
30.0000 mL | Freq: Four times a day (QID) | ORAL | Status: DC | PRN
  Administered 2011-02-17 – 2011-02-20 (×3): 30 mL via ORAL
  Filled 2011-02-17 (×4): qty 30

## 2011-02-17 MED ORDER — CARVEDILOL 3.125 MG PO TABS
3.1250 mg | ORAL_TABLET | Freq: Two times a day (BID) | ORAL | Status: DC
  Administered 2011-02-19 – 2011-02-21 (×4): 3.125 mg via ORAL
  Filled 2011-02-17 (×10): qty 1

## 2011-02-17 MED ORDER — TECHNETIUM TC 99M TETROFOSMIN IV KIT: 10.0000 | PACK | Freq: Once | INTRAVENOUS | Status: DC | PRN

## 2011-02-17 MED ORDER — TECHNETIUM TC 99M TETROFOSMIN IV KIT: 30.0000 | PACK | Freq: Once | INTRAVENOUS | Status: DC | PRN

## 2011-02-17 NOTE — Progress Notes (Signed)
Marcelino Duster RN back to monitor patient for remainder of test. Aware he will need his nitropaste replaced when he gets back to his room (our pyxis does not carry it).

## 2011-02-17 NOTE — Progress Notes (Signed)
Subjective:  Patient complains of leg chest pain left arm pain and also abdominal pain off and on Denies any vomiting or diarrhea denies any melena Serum amylase a mildly elevated lipase normal range  Objective:  Vital Signs in the last 24 hours: Temp:  [97.6 F (36.4 C)-98.5 F (36.9 C)] 98 F (36.7 C) (01/26 0427) Pulse Rate:  [56-82] 75  (01/26 1022) Resp:  [14-23] 16  (01/26 0953) BP: (126-192)/(58-98) 157/66 mmHg (01/26 1022) SpO2:  [98 %-100 %] 98 % (01/26 0953) Weight:  [78.064 kg (172 lb 1.6 oz)] 78.064 kg (172 lb 1.6 oz) (01/25 2217)  Intake/Output from previous day: 01/25 0701 - 01/26 0700 In: 240 [P.O.:240] Out: 150 [Urine:150] Intake/Output from this shift: Total I/O In: 0  Out: 200 [Urine:200]  Physical Exam: General appearance: alert and cooperative Neck: no carotid bruit, no JVD and supple, symmetrical, trachea midline Lungs: clear to auscultation bilaterally Heart: regular rate and rhythm, S1, S2 normal and Soft systolic murmur noted no S3 gallop Abdomen: Abdomen is soft bowel sounds are present mild generalized tenderness no guarding no rebound tenderness Extremities: extremities normal, atraumatic, no cyanosis or edema  Lab Results:  Basename 02/17/11 0720 02/16/11 2253  WBC 4.7 5.7  HGB 11.9* 11.6*  PLT 159 156    Basename 02/17/11 0720 02/16/11 2253  NA 146* 141  K 4.0 3.5  CL 113* 110  CO2 24 24  GLUCOSE 63* 125*  BUN 11 11  CREATININE 1.76* 1.80*    Basename 02/17/11 0720 02/16/11 2356  TROPONINI <0.30 <0.30   Hepatic Function Panel  Basename 02/16/11 2253  PROT 6.0  ALBUMIN 2.8*  AST 24  ALT 30  ALKPHOS 72  BILITOT 0.4  BILIDIR --  IBILI --    Basename 02/17/11 0720  CHOL 131   No results found for this basename: PROTIME in the last 72 hours  Imaging: Dg Chest 2 View  02/16/2011  *RADIOLOGY REPORT*  Clinical Data: Chest pain for 8 months  CHEST - 2 VIEW  Comparison: Chest radiograph 02/14/2011  Findings: Normal  mediastinum and cardiac silhouette.  Lungs are hyperinflated.  No effusion, infiltrate, pneumothorax.  IMPRESSION:  1.  No acute cardiopulmonary process. 2.  Hyperinflated lungs.  Original Report Authenticated By: Genevive Bi, M.D.    Cardiac Studies:  Assessment/Plan:  Chest pain MI ruled out Abdominal pain rule out ischemic bowel workup negative so far including recent MRI colonoscopy and upper endoscopy Coronary artery disease history of MI x2 in the past Ischemic myopathy Compensated systolic heart failure History of paroxysmal atrial fibrillation History of bilateral PE in the past History of DVT in the past Hypertension Diabetes mellitus Chronic kidney disease stage III Anemia Hypothyroidism Plan Schedule for nuclear stress test today Continue rest of meds  LOS: 1 day    Nicholi Ghuman N 02/17/2011, 11:10 AM

## 2011-02-17 NOTE — Progress Notes (Signed)
Nitropaste removed pre myoview test

## 2011-02-17 NOTE — Progress Notes (Signed)
02/17/2011 0830 Late entry Nursing Note: Upon assessment this am, patient complained of slight CP/ABD pain radiating to throat 2/10. Denies nausea or vomiting. States similar to reflux pain experienced in past. VS collected and stable. EKG performed. No acute change when compared to prior one this am. 2L o2 Anderson applied. 0.5 inch NTG paste already on LT chest wall per orders. MD made aware. Orders received. Prn Maalox given per orders. Upon reassessment of patient. Denies pain. Will continue to closely monitor patient.  Nivan Melendrez, Blanchard Kelch

## 2011-02-18 ENCOUNTER — Other Ambulatory Visit: Payer: Self-pay

## 2011-02-18 LAB — GLUCOSE, CAPILLARY: Glucose-Capillary: 99 mg/dL (ref 70–99)

## 2011-02-18 LAB — PLATELET INHIBITION P2Y12: Platelet Function  P2Y12: 227 [PRU] (ref 194–418)

## 2011-02-18 MED ORDER — SODIUM BICARBONATE 8.4 % IV SOLN
INTRAVENOUS | Status: AC
  Administered 2011-02-19: 06:00:00 via INTRAVENOUS
  Filled 2011-02-18: qty 1000

## 2011-02-18 MED ORDER — SODIUM CHLORIDE 0.9 % IJ SOLN: 3.0000 mL | INTRAMUSCULAR | Status: DC | PRN

## 2011-02-18 MED ORDER — ASPIRIN 81 MG PO CHEW
324.0000 mg | CHEWABLE_TABLET | ORAL | Status: AC
  Administered 2011-02-19: 324 mg via ORAL
  Filled 2011-02-18: qty 4

## 2011-02-18 MED ORDER — SODIUM CHLORIDE 0.9 % IV SOLN: 1.0000 mL/kg/h | INTRAVENOUS | Status: DC

## 2011-02-18 MED ORDER — DIAZEPAM 5 MG PO TABS: 5.0000 mg | ORAL_TABLET | ORAL | Status: DC

## 2011-02-18 MED ORDER — SODIUM CHLORIDE 0.9 % IV SOLN: 250.0000 mL | INTRAVENOUS | Status: DC | PRN

## 2011-02-18 MED ORDER — SODIUM BICARBONATE 8.4 % IV SOLN
INTRAVENOUS | Status: AC
  Administered 2011-02-19: 10:00:00 via INTRAVENOUS
  Filled 2011-02-18: qty 1000

## 2011-02-18 MED ORDER — ASPIRIN EC 81 MG PO TBEC
81.0000 mg | DELAYED_RELEASE_TABLET | Freq: Every day | ORAL | Status: DC
  Administered 2011-02-20 – 2011-02-21 (×2): 81 mg via ORAL
  Filled 2011-02-18 (×2): qty 1

## 2011-02-18 MED ORDER — SODIUM CHLORIDE 0.9 % IV SOLN: INTRAVENOUS | Status: DC

## 2011-02-18 MED ORDER — DIAZEPAM 5 MG PO TABS
5.0000 mg | ORAL_TABLET | ORAL | Status: AC
  Administered 2011-02-19: 5 mg via ORAL
  Filled 2011-02-18: qty 1

## 2011-02-18 MED ORDER — SODIUM CHLORIDE 0.9 % IJ SOLN
3.0000 mL | Freq: Two times a day (BID) | INTRAMUSCULAR | Status: DC
  Administered 2011-02-18 – 2011-02-19 (×2): 3 mL via INTRAVENOUS

## 2011-02-18 NOTE — Progress Notes (Signed)
02/18/2011 4:28 PM Nursing Note: Patient viewed cardiac cath educational video #115.  Questions and concerns addressed.  Sera Hitsman, Blanchard Kelch

## 2011-02-18 NOTE — Progress Notes (Signed)
Sodium Bicarbonate Infusion for Prevention of Contrast Induced Nephropathy  CONSULT NOTE - INITIAL   No Known Allergies  Admit Complaint:  78 year old male admitted 1/25 with chest pain. Pharmacy consulted to dose sodium bicarbonate infusion for prevention of contrast induced nephropathy.  Assessment:  Estimated Creatinine Clearance: 32.9 ml/min (by C-G formula based on Cr of 1.76).    Patient measurements: Height: 5\' 8"  (172.7 cm), Weight: 177 lb 4 oz (80.4 kg), IBW/kg (Calculated) : 68.4   Patient not on diuretic therapy  Goal of Therapy:  Prevention of contrast induced nephropathy  Plan:  Sodium bicarbonate 300 ml/hr 1 hour prior to cath then 100 ml/h during cath and 6 hours after. Follow Serum Creatinine for 24 hours post cath.  Vital Signs: Temp: 98.9 F (37.2 C) (01/27 1426) Temp src: Oral (01/27 0500) BP: 138/70 mmHg (01/27 1426) Pulse Rate: 60  (01/27 1426) Intake/Output from previous day: 01/26 0701 - 01/27 0700 In: 480 [P.O.:480] Out: 1125 [Urine:1125] Intake/Output from this shift: Total I/O In: 480 [P.O.:480] Out: 201 [Urine:200; Stool:1]  Labs:  [facility] 02/17/11 0720 02/16/11 2253 02/16/11 1633 02/16/11 0245  WBC 4.7 5.7 6.2 --  HGB 11.9* 11.6* 12.4* --  HCT 34.8* 33.5* 35.6* --  PLT 159 156 161 --  APTT -- 28 -- --  CREATININE 1.76* 1.80* 1.85* --  LABCREA -- -- -- --  CREATININE 1.76* 1.80* 1.85* --  CREAT24HRUR -- -- -- --  MG -- 1.8 -- --  PHOS -- -- -- --  ALBUMIN -- 2.8* -- 3.2*  PROT -- 6.0 -- 7.0  ALBUMIN -- 2.8* -- 3.2*  AST -- 24 -- 28  ALT -- 30 -- 36  ALKPHOS -- 72 -- 80  BILITOT -- 0.4 -- 0.3  BILIDIR -- -- -- --  IBILI -- -- -- --   Medical History: Past Medical History  Diagnosis Date  . GERD (gastroesophageal reflux disease)   . Hypothyroidism   . Renal disorder   . Chronic back pain   . Hx of heart artery stent   . Myocardial infarction   . Stroke   . Arthritis   . Gout   . Pulmonary embolism, bilateral 2010    . Hyperlipemia     Medications:  Prescriptions prior to admission  Medication Sig Dispense Refill  . albuterol (PROVENTIL HFA;VENTOLIN HFA) 108 (90 BASE) MCG/ACT inhaler Inhale 2 puffs into the lungs every 6 (six) hours as needed.      Marland Kitchen amiodarone (PACERONE) 200 MG tablet Take 200 mg by mouth daily.       . beclomethasone (QVAR) 80 MCG/ACT inhaler Inhale 1 puff into the lungs 2 (two) times daily as needed. For shortness of breath      . carvedilol (COREG) 6.25 MG tablet Take 6.25 mg by mouth 2 (two) times daily with a meal.       . clopidogrel (PLAVIX) 75 MG tablet Take 75 mg by mouth daily.        Marland Kitchen dexlansoprazole (DEXILANT) 60 MG capsule Take 60 mg by mouth daily. For reflux      . fluticasone (FLONASE) 50 MCG/ACT nasal spray Place 2 sprays into the nose daily as needed. For stuffy nose and dryness      . furosemide (LASIX) 40 MG tablet Take 40 mg by mouth daily.       Marland Kitchen glimepiride (AMARYL) 1 MG tablet Take 1 mg by mouth daily before breakfast.       . isosorbide mononitrate (IMDUR)  60 MG 24 hr tablet Take 60 mg by mouth 2 (two) times daily.       Marland Kitchen levothyroxine (SYNTHROID, LEVOTHROID) 50 MCG tablet Take 50 mcg by mouth daily.       . nitroGLYCERIN (NITROSTAT) 0.4 MG SL tablet Place 0.4 mg under the tongue every 5 (five) minutes as needed. For chest pain       . pantoprazole (PROTONIX) 20 MG tablet Take 1 tablet (20 mg total) by mouth daily.  30 tablet  1  . polyethylene glycol (MIRALAX / GLYCOLAX) packet Take 17 g by mouth daily as needed. For constipation      . rosuvastatin (CRESTOR) 10 MG tablet Take 10 mg by mouth daily.       Marland Kitchen senna-docusate (SENOKOT-S) 8.6-50 MG per tablet Take 1 tablet by mouth daily as needed. For constipation      . spironolactone (ALDACTONE) 25 MG tablet Take 25 mg by mouth daily.       . sucralfate (CARAFATE) 1 G tablet Take 1 tablet (1 g total) by mouth 4 (four) times daily.  40 tablet  1  . Tamsulosin HCl (FLOMAX) 0.4 MG CAPS Take 0.4 mg by mouth at  bedtime.       . ranitidine (ZANTAC) 300 MG capsule Take 300 mg by mouth every evening.         Lovenia Kim Pharm.D., BCPS Clinical Pharmacist 02/18/2011 4:52 PM Pager: [phone] Phone: [phone]

## 2011-02-18 NOTE — Progress Notes (Signed)
Subjective:  Complains of vague chest pain off and on without associated symptoms States abdominal pain has markedly improved Nuclear stress test showed mild-to-moderate the mid and apical segment of anteroseptal wall reversible ischemia with EF of 54%  Objective:  Vital Signs in the last 24 hours: Temp:  [98 F (36.7 C)-98.9 F (37.2 C)] 98.9 F (37.2 C) (01/27 1426) Pulse Rate:  [53-60] 60  (01/27 1426) Resp:  [18] 18  (01/27 1426) BP: (132-156)/(70-78) 138/70 mmHg (01/27 1426) SpO2:  [96 %-98 %] 98 % (01/27 1426) Weight:  [80.4 kg (177 lb 4 oz)] 80.4 kg (177 lb 4 oz) (01/27 0500)  Intake/Output from previous day: 01/26 0701 - 01/27 0700 In: 480 [P.O.:480] Out: 1125 [Urine:1125] Intake/Output from this shift: Total I/O In: 480 [P.O.:480] Out: 201 [Urine:200; Stool:1]  Physical Exam: General appearance: alert and cooperative Neck: no carotid bruit, no JVD and supple, symmetrical, trachea midline Lungs: clear to auscultation bilaterally Heart: regular rate and rhythm, S1, S2 normal and Soft systolic murmur noted Abdomen: Soft bowel sounds present mild generalized tenderness no guarding no rebound Extremities: extremities normal, atraumatic, no cyanosis or edema  Lab Results:  Basename 02/17/11 0720 02/16/11 2253  WBC 4.7 5.7  HGB 11.9* 11.6*  PLT 159 156    Basename 02/17/11 0720 02/16/11 2253  NA 146* 141  K 4.0 3.5  CL 113* 110  CO2 24 24  GLUCOSE 63* 125*  BUN 11 11  CREATININE 1.76* 1.80*    Basename 02/17/11 0720 02/16/11 2356  TROPONINI <0.30 <0.30   Hepatic Function Panel  Basename 02/16/11 2253  PROT 6.0  ALBUMIN 2.8*  AST 24  ALT 30  ALKPHOS 72  BILITOT 0.4  BILIDIR --  IBILI --    Basename 02/17/11 0720  CHOL 131   No results found for this basename: PROTIME in the last 72 hours  Imaging: Dg Chest 2 View  02/16/2011  *RADIOLOGY REPORT*  Clinical Data: Chest pain for 8 months  CHEST - 2 VIEW  Comparison: Chest radiograph 02/14/2011   Findings: Normal mediastinum and cardiac silhouette.  Lungs are hyperinflated.  No effusion, infiltrate, pneumothorax.  IMPRESSION:  1.  No acute cardiopulmonary process. 2.  Hyperinflated lungs.  Original Report Authenticated By: Genevive Bi, M.D.   Nm Myocar Multi W/spect W/wall Motion / Ef  02/17/2011  *RADIOLOGY REPORT*  Clinical Data:  78 year old male with history of chest pain, diabetes, and hypertension. Additional history coronary artery disease with stenting.  MYOCARDIAL IMAGING WITH SPECT (REST AND PHARMACOLOGIC-STRESS) GATED LEFT VENTRICULAR WALL MOTION STUDY LEFT VENTRICULAR EJECTION FRACTION  Technique:  Resting myocardial SPECT imaging was initially performed after intravenous administration of radiopharmaceutical. Myocardial SPECT was subsequently performed after additional radiopharmaceutical injection during pharmacologic-stress supervised by the Cardiology staff.  Quantitative gated imaging was also performed to evaluate left ventricular wall motion, and estimate left ventricular ejection fraction.  Radiopharmaceutical:  Tc-9m Myoview at rest and during stress.  Comparison: Nuclear perfusion exam 07/03/2010  Findings:  Technique: Study is adequate.  Perfusion: There is mild to moderate decreased counts within the mid and apical segments of the anterior septal wall which improves from stress to rest. There was a small scar at this site on comparison exam.  Wall motion:  Septal hypokinesia is noted.  Left ventricular ejection fraction:  Calculated left ventricular ejection fraction =  54%%.  This is improved from 47% on prior.  IMPRESSION:  1.  Mild to moderate focus of ischemia within the anteroseptal wall is new  from prior. 2.  Septal hypokinesia.  3.  Left ventricular ejection fraction equal 54%.  The findings conveyed to the patient's nurse on 02/17/2011 at 1210 hours  Original Report Authenticated By: Genevive Bi, M.D.    Cardiac Studies:  Assessment/Plan:    Unstable angina positive nuclear stress test as above Coronary artery disease history of MI x2 in the past Ischemic cardiomyopathy Compensated systolic heart failure History of paroxysmal A. fib in the past History of bilateral PE in the past History of DVT the past Hypertension Diabetes mellitus Chronic kidney disease stage IV Anemia Hypothyroidism Abdominal pain etiology unclear Plan Discussed with patient regarding stress test result and the left cardiac cath possible PTCA stenting its risk and benefits i.e. death MI stroke need for emergency CABG low-cholesterol complications risk of restenosis worsening renal function requiring hemodialysis local vascular complications etc. and consented for PCI  LOS: 2 days    [name] N 02/18/2011, 3:24 PM

## 2011-02-19 ENCOUNTER — Encounter (HOSPITAL_COMMUNITY)
Admission: EM | Disposition: A | Discharge status: Home / Self Care | Payer: Self-pay | Source: Ambulatory Visit | Attending: Cardiology

## 2011-02-19 HISTORY — PX: LEFT HEART CATHETERIZATION WITH CORONARY ANGIOGRAM: SHX5451

## 2011-02-19 LAB — BASIC METABOLIC PANEL
BUN: 14 mg/dL (ref 6–23)
Calcium: 9.2 mg/dL (ref 8.4–10.5)
Chloride: 105 mEq/L (ref 96–112)
Creatinine, Ser: 1.77 mg/dL — ABNORMAL HIGH (ref 0.50–1.35)
GFR calc Af Amer: 40 mL/min — ABNORMAL LOW (ref >90)

## 2011-02-19 SURGERY — LEFT HEART CATHETERIZATION WITH CORONARY ANGIOGRAM
Anesthesia: LOCAL

## 2011-02-19 MED ORDER — NITROGLYCERIN 0.2 MG/ML ON CALL CATH LAB
INTRAVENOUS | Status: AC
  Filled 2011-02-19: qty 1

## 2011-02-19 MED ORDER — HEPARIN (PORCINE) IN NACL 2-0.9 UNIT/ML-% IJ SOLN
INTRAMUSCULAR | Status: AC
  Filled 2011-02-19: qty 2000

## 2011-02-19 MED ORDER — MIDAZOLAM HCL 2 MG/2ML IJ SOLN
INTRAMUSCULAR | Status: AC
  Filled 2011-02-19: qty 2

## 2011-02-19 MED ORDER — FENTANYL CITRATE 0.05 MG/ML IJ SOLN
INTRAMUSCULAR | Status: AC
  Filled 2011-02-19: qty 2

## 2011-02-19 MED ORDER — SODIUM CHLORIDE 0.9 % IV SOLN: INTRAVENOUS | Status: DC

## 2011-02-19 MED ORDER — SODIUM BICARBONATE 8.4 % IV SOLN: INTRAVENOUS | Status: DC

## 2011-02-19 MED ORDER — ONDANSETRON HCL 4 MG/2ML IJ SOLN
4.0000 mg | Freq: Four times a day (QID) | INTRAMUSCULAR | Status: DC | PRN
  Administered 2011-02-19 – 2011-02-21 (×2): 4 mg via INTRAVENOUS
  Filled 2011-02-19 (×3): qty 2

## 2011-02-19 MED ORDER — SODIUM CHLORIDE 0.9 % IV SOLN: 1.0000 mL/kg/h | INTRAVENOUS | Status: AC

## 2011-02-19 MED ORDER — LIDOCAINE HCL (PF) 1 % IJ SOLN
INTRAMUSCULAR | Status: AC
  Filled 2011-02-19: qty 30

## 2011-02-19 MED ORDER — OXYCODONE-ACETAMINOPHEN 5-325 MG PO TABS
1.0000 | ORAL_TABLET | ORAL | Status: DC | PRN
  Administered 2011-02-20: 1 via ORAL
  Filled 2011-02-19: qty 1

## 2011-02-19 MED ORDER — ACETAMINOPHEN 325 MG PO TABS: 650.0000 mg | ORAL_TABLET | ORAL | Status: DC | PRN

## 2011-02-19 NOTE — Cardiovascular Report (Signed)
NAMEDEVAN, DANZER NO.:  [id number]  MEDICAL RECORD NO.:  [id number]  LOCATION:  2034                         FACILITY:  [facility]  PHYSICIAN:  Eduardo Osier. Sharyn Lull, M.D. DATE OF BIRTH:  1929-11-25  DATE OF PROCEDURE:  02/19/2011 DATE OF DISCHARGE:                           CARDIAC CATHETERIZATION   PROCEDURE:  Left cardiac catheterization with selective left and right coronary angiography, measurement of left ventricular end-diastolic pressure via right groin using Judkins technique.  INDICATION FOR THE PROCEDURE:  [name] is a 78 year old male with past medical history significant for multiple medical problems, i.e., coronary artery disease, history of MI x2 in the past status post PTCA to LAD, left circumflex and RCA in the past, ischemic cardiomyopathy, history of congestive heart failure, history of congestive heart failure secondary to systolic dysfunction, history of bilateral pulmonary embolism in the past, history of paroxysmal atrial fibrillation, hypertension, non-insulin-dependent diabetes mellitus, hypercholesteremia, history of DVT, remote tobacco abuse, chronic kidney disease stage 3, hypothyroidism, history of pancreatitis in the past, and GERD.  He came to the ER complaining of left-sided chest pain radiating to left shoulder and left arm, off and on, for the last few weeks.  The patient describes chest pain as dull aching pressure, grade 5/10, associated with nausea and abdominal pain.  The patient was seen in [facility] earlier this morning with vague recurrent abdominal pain, had x-ray and labs which were all negative, and was sent home.  The patient had extensive workup for abdominal pain including MRI of the abdomen to evaluate for bowel ischemia, which were negative.  The patient denies any urinary complaints.  Denies any fever or chills. Denies cough, body aches.  The patient had multiple ER visits for similar complaints in  the past.  The patient denies any palpitation, lightheadedness, or syncope.  Denies PND, orthopnea, or leg swelling. EKG done in the ER showed normal sinus rhythm with left bundle-branch block, which was old.  BRIEF HOSPITAL COURSE:  The patient was admitted to telemetry unit.  MI was ruled out by serial enzymes and EKG.  The patient subsequently underwent a Lexiscan Myoview stress test, which showed mild-to-moderate focus of ischemia within the anteroseptal wall, which is new from the prior exam.  There was septal hypokinesia. His ejection fraction has improved to 54%.  Due to recurrent chest pain, positive Lexiscan Myoview, I discussed with the patient regarding left catheterization, possible PTCA  stenting, its risks and benefits, i.e., death, MI, stroke, need for emergency CABG, local vascular complications, etc., and consented for PCI.  PROCEDURE:  After obtaining the informed consent, the patient was brought to the cath lab and was placed on the fluoroscopy table.  Right groin was prepped and draped in usual fashion.  Xylocaine 1% was used for local anesthesia in the right groin.  With the help of thin-wall needle, a 5-French arterial sheath was placed.  The sheath was aspirated and flushed.  A 5-French left Judkins catheter was advanced over the wire under fluoroscopic guidance up to the ascending aorta.  Wire was pulled out, the catheter was aspirated and connected to the Manifold.  Catheter was further advanced and engaged into  the left coronary ostium.  Multiple views of the left system were taken.  The catheter was disengaged and was pulled out over the wire and was replaced with a 5-French right Judkins catheter, which was advanced over the wire under fluoroscopic guidance up to the ascending aorta.  Wire was pulled out, the catheter was aspirated and connected to the Manifold.  Catheter was further advanced and engaged into right coronary ostium.  Multiple views of the  right system were taken.  The catheter was disengaged and was pulled out over the wire and was replaced with 5-French pigtail catheter, which was advanced over the wire under fluoroscopic guidance up to the ascending aorta.  Wire was pulled out, the catheter was aspirated and connected to the Manifold. Catheter was further advanced across the aortic valve into the LV.  LV pressures were recorded.  The pullback pressures were recorded from the aorta.  There was no significant gradient across the aortic valve.  The pigtail catheter was pulled out over the wire.  Sheaths were aspirated and flushed.  FINDINGS:  LVEDP is 6 mmHg.  Left main has 15-20% proximal and mid stenosis.  LAD has 10-15% proximal sequential stenosis.  The mid stented portion is widely patent and has 20-25% distal stenosis and 75-80% apical stenosis.  Vessel at the apex is very small.  Diagonal 1 and 2 were very small, which has mild disease.  Left circumflex has 30-40% mid stenosis.  OM 1 and OM 2 had 20-25% proximal stenosis.  OM 3 is small, which has 20-25% stenosis.  RCA has 20-25% proximal and mid stenosis and 30-40% distal sequential stenosis.  Distal stent is patent.  The mid stent has 20-25% stenosis.  PDA and PLV branches were very small.  The patient tolerated procedure well.  There were no complications.  The patient was transferred to recovery room in stable condition.     Eduardo Osier. Sharyn Lull, M.D.     MNH/MEDQ  D:  02/19/2011  T:  02/19/2011  Job:  [id number]

## 2011-02-19 NOTE — Op Note (Signed)
Cardiac cath report dictated on 02/19/2011 dictation number is [phone]

## 2011-02-20 LAB — BASIC METABOLIC PANEL
BUN: 11 mg/dL (ref 6–23)
CO2: 30 mEq/L (ref 19–32)
GFR calc non Af Amer: 38 mL/min — ABNORMAL LOW (ref >90)
Glucose, Bld: 94 mg/dL (ref 70–99)
Potassium: 4.5 mEq/L (ref 3.5–5.1)

## 2011-02-20 MED ORDER — LUBIPROSTONE 24 MCG PO CAPS
24.0000 ug | ORAL_CAPSULE | Freq: Two times a day (BID) | ORAL | Status: DC
  Administered 2011-02-20 – 2011-02-21 (×2): 24 ug via ORAL
  Filled 2011-02-20 (×4): qty 1

## 2011-02-20 MED ORDER — AMLODIPINE BESYLATE 5 MG PO TABS
5.0000 mg | ORAL_TABLET | Freq: Every day | ORAL | Status: DC
  Administered 2011-02-20 – 2011-02-21 (×2): 5 mg via ORAL
  Filled 2011-02-20 (×2): qty 1

## 2011-02-20 NOTE — Consult Note (Signed)
Reason for Consult:ABM pain Referring Physician: Rinaldo Cloud, M.D.  [name] HPI: This is a 78 year old gentleman who is well-known to me with multiple medical problems.  For the past 1-2 months he complains of abdominal pain.  The pain is generalized and extensive work up was unrevealing.  There was no evidence of mesenteric ischemia and no discernable issues with GERD.  He did fail GERD treatment.  The patient did report having issues with constipation and a recent focus of treatment for his constipation has markedly improved and almost resolved his abdominal pain with the use of Amitiza 24 mcg BID.  His current cardiac cath does not reveal any significant CAD requiring repeat stenting.  Past Medical History  Diagnosis Date  . GERD (gastroesophageal reflux disease)   . Hypothyroidism   . Renal disorder   . Chronic back pain   . Hx of heart artery stent   . Myocardial infarction   . Stroke   . Arthritis   . Gout   . Pulmonary embolism, bilateral 2010  . Hyperlipemia     Past Surgical History  Procedure Date  . Appendectomy   . Cardiac catheterization   . Coronary angioplasty with stent placement   . Esophagogastroduodenoscopy 12/22/2010    Procedure: ESOPHAGOGASTRODUODENOSCOPY (EGD);  Surgeon: Theda Belfast;  Location: [facility] ENDOSCOPY;  Service: Endoscopy;  Laterality: N/A;    History reviewed. No pertinent family history.  Social History:  reports that he has quit smoking. He does not have any smokeless tobacco history on file. He reports that he does not drink alcohol or use illicit drugs.  Allergies: No Known Allergies  Medications:  Scheduled:   . amiodarone  200 mg Oral Daily  . amLODipine  5 mg Oral Daily  . aspirin EC  81 mg Oral Daily  . carvedilol  3.125 mg Oral BID WC  . fluticasone  1 puff Inhalation BID  . levothyroxine  50 mcg Oral Daily  . pantoprazole  40 mg Oral BID AC  . rosuvastatin  10 mg Oral Daily  . sucralfate  1 g Oral QID  . Tamsulosin  HCl  0.4 mg Oral QHS   Continuous:   . sodium chloride 1 mL/kg/hr (02/19/11 1305)    Results for orders placed during the hospital encounter of 02/16/11 (from the past 24 hour(s))  BASIC METABOLIC PANEL     Status: Abnormal   Collection Time   02/20/11  5:57 AM      Component Value Range   Sodium 143  135 - 145 (mEq/L)   Potassium 4.5  3.5 - 5.1 (mEq/L)   Chloride 109  96 - 112 (mEq/L)   CO2 30  19 - 32 (mEq/L)   Glucose, Bld 94  70 - 99 (mg/dL)   BUN 11  6 - 23 (mg/dL)   Creatinine, Ser 2.95 (*) 0.50 - 1.35 (mg/dL)   Calcium 9.2  8.4 - 62.1 (mg/dL)   GFR calc non Af Amer 38 (*) >90 (mL/min)   GFR calc Af Amer 44 (*) >90 (mL/min)     No results found.  ROS:  As stated above in the HPI otherwise negative.  Blood pressure 153/81, pulse 59, temperature 97.4 F (36.3 C), temperature source Oral, resp. rate 18, height 5\' 8"  (1.727 m), weight 80.4 kg (177 lb 4 oz), SpO2 97.00%.    PE: Gen: NAD, Alert and Oriented HEENT:  [place]/AT, EOMI Neck: Supple, no LAD Lungs: CTA Bilaterally CV: RRR without M/G/R ABM: Soft, mild  tenderness, +BS Ext: No C/C/E  Assessment/Plan: 1) Constipation. 2) ABM pain secondary to constiaption. 3) CAD.   I am encouraged to hear that the use of Amitiza has helped to improve his abdominal pain.  It was a very difficult struggle trying to improve this pain and to prevent the frequent ER visits.  Plan: 1) Restart Amitiza 24 mcg BID. 2) CAD - management per Dr. Sharyn Lull. 3) I will have him follow up in the office in one week.  Shametra Cumberland D 02/20/2011, 1:36 PM

## 2011-02-20 NOTE — Progress Notes (Signed)
Subjective:  Complains of vague generalized abdominal pain with no associated nausea vomiting or diarrhea Denies any further chest pains had left cath yesterday which showed patent stents2  Objective:  Vital Signs in the last 24 hours: Temp:  [97.9 F (36.6 C)-98.6 F (37 C)] 98.6 F (37 C) (01/29 0414) Pulse Rate:  [55-104] 60  (01/29 0414) Resp:  [18-21] 18  (01/29 0414) BP: (144-197)/(62-82) 161/74 mmHg (01/29 0414) SpO2:  [92 %-100 %] 95 % (01/29 0414)  Intake/Output from previous day: 01/28 0701 - 01/29 0700 In: 1700.1 [P.O.:240; I.V.:1460.1] Out: 1801 [Urine:1800; Stool:1] Intake/Output from this shift:    Physical Exam: General appearance: alert and cooperative Neck: no adenopathy, no carotid bruit, no JVD and supple, symmetrical, trachea midline Lungs: clear to auscultation bilaterally Heart: regular rate and rhythm, S1, S2 normal and Soft systolic murmur noted Abdomen: Soft bowel sounds present mild generalized tenderness no guarding no rebound Extremities: extremities normal, atraumatic, no cyanosis or edema Right groin stable with no evidence of hematoma or ecchymosis Lab Results: No results found for this basename: WBC:2,HGB:2,PLT:2 in the last 72 hours  Basename 02/20/11 0557 02/19/11 0615  NA 143 140  K 4.5 4.6  CL 109 105  CO2 30 25  GLUCOSE 94 89  BUN 11 14  CREATININE 1.66* 1.77*   No results found for this basename: TROPONINI:2,CK,MB:2 in the last 72 hours Hepatic Function Panel No results found for this basename: PROT,ALBUMIN,AST,ALT,ALKPHOS,BILITOT,BILIDIR,IBILI in the last 72 hours No results found for this basename: CHOL in the last 72 hours No results found for this basename: PROTIME in the last 72 hours  Imaging: No results found.  Cardiac Studies:  Assessment/Plan:  Recurrent abdominal pain etiology unclear rule out bowel ischemia Status post chest pain positive nuclear stress test Status post left cath Coronary artery disease history  of MI in the past stable History of paroxysmal A. fib in the past History of bilateral PE in the past History of DVT in the past Hypertension Non-insulin-dependent diabetes mellitus controlled by diet Hypercholesteremia Chronic kidney disease stage III stable Plan Continue present management Add amlodipine per orders Check labs in a.m.  LOS: 4 days    [name] N 02/20/2011, 9:00 AM

## 2011-02-20 NOTE — Progress Notes (Signed)
UR Completed. Simmons, Rola Lennon F [phone]  

## 2011-02-21 LAB — BASIC METABOLIC PANEL
BUN: 14 mg/dL (ref 6–23)
Calcium: 9.4 mg/dL (ref 8.4–10.5)
GFR calc Af Amer: 43 mL/min — ABNORMAL LOW (ref >90)
GFR calc non Af Amer: 37 mL/min — ABNORMAL LOW (ref >90)
Potassium: 4.1 mEq/L (ref 3.5–5.1)
Sodium: 141 mEq/L (ref 135–145)

## 2011-02-21 MED ORDER — CARVEDILOL 3.125 MG PO TABS: 3.1250 mg | ORAL_TABLET | Freq: Two times a day (BID) | ORAL | Status: DC

## 2011-02-21 MED ORDER — AMLODIPINE BESYLATE 5 MG PO TABS: 5.0000 mg | ORAL_TABLET | Freq: Every day | ORAL | Status: DC

## 2011-02-21 MED ORDER — CARVEDILOL 6.25 MG PO TABS: 3.1250 mg | ORAL_TABLET | Freq: Two times a day (BID) | ORAL | Status: DC

## 2011-02-21 MED ORDER — LUBIPROSTONE 24 MCG PO CAPS: 24.0000 ug | ORAL_CAPSULE | Freq: Two times a day (BID) | ORAL | Status: AC

## 2011-02-21 MED ORDER — ASPIRIN 81 MG PO TBEC: 81.0000 mg | DELAYED_RELEASE_TABLET | Freq: Every day | ORAL | Status: DC

## 2011-02-21 NOTE — Progress Notes (Signed)
Pt. Discharged 02/21/2011  12:53 PM Discharge instructions reviewed with patient/family. Patient/family verbalized understanding. All Rx's given. Questions answered as needed. Pt. Discharged to home with family/self. Taken off unit via [address] [facility]

## 2011-02-21 NOTE — Discharge Summary (Signed)
NAMEKALYN, DIMATTIA NO.:  [id number]  MEDICAL RECORD NO.:  [id number]  LOCATION:  WA16                         FACILITY:  [facility]  PHYSICIAN:  Jissell Trafton N. Sharyn Lull, M.D. DATE OF BIRTH:  12/27/1929  DATE OF ADMISSION:  02/16/2011 DATE OF DISCHARGE:  02/16/2011                              DISCHARGE SUMMARY   ADMITTING DIAGNOSES: 1. Recurrent chest pain, rule out progression of coronary artery     disease, rule out neoatherosclerosis, rule out myocardial     infarction. 2. Recurrent abdominal pain, etiology unclear. 3. Coronary artery disease, history of myocardial infarction x2 in the     past. 4. Ischemic cardiomyopathy, compensated systolic heart failure. 5. Status post paroxysmal atrial fibrillation. 6. History of bilateral pulmonary embolism in the past. 7. History of deep venous thrombosis in the past hypertension. 8. Non-insulin-dependent diabetes mellitus. 9. Hypercholesteremia. 10.Chronic kidney disease, stage III. 11.Anemia. 12.Hypothyroidism.  DISCHARGE DIAGNOSES: 1. Status post recurrent chest pain, positive nuclear stress test,     status post left catheterization, noted to have patent stents. 2. Recurrent chronic abdominal pain, workup negative in the past. 3. Coronary artery disease, history of myocardial infarction x2 in the     past. 4. Ischemic cardiomyopathy. 5. Compensated systolic heart failure. 6. Status post paroxysmal atrial fibrillation. 7. History of bilateral pulmonary embolism in the past. 8. History of deep venous thrombosis in the past. 9. Hypertension. 10.Non-insulin-dependent diabetes mellitus. 11.Hypercholesteremia. 12.Chronic kidney disease, stage III. 13.Anemia. 14.Hypothyroidism.  DISCHARGE HOME MEDICATIONS: 1. Enteric-coated aspirin 81 mg 1 tablet daily. 2. Carvedilol 3.125 mg 1 tablet twice daily. 3. Amiodarone 200 mg 1 tablet daily. 4. Amlodipine 5 mg 1 tablet daily. 5. Dexilant 60 mg 1 daily. 6. Albuterol  inhaler 2 puffs every 6 hours as needed as before. 7. QVAR 1 puff 2 times daily as before as needed. 8. Flonase 2 sprays daily as needed. 9. Amaryl 1 mg tablet daily. 10.Imdur 60 mg 1 tablet daily. 11.Levothyroxine 50 mcg 1 tablet daily. 12.Nitrostat 0.4 mg q.5 minutes as needed for chest pain. 13.Ranitidine 300 mg 1 daily at night. 14.Crestor 10 mg 1 tablet daily. 15.Carafate 1 g 4 times daily. 16.Flomax 0.4 mg at bedtime.  The patient has been advised to stopped clopidogrel, Lasix, Protonix, spironolactone, and Colace.  The patient was also added by GI Amitiza 24 mcg 1 capsule twice daily for chronic constipation.  BRIEF HISTORY AND HOSPITAL COURSE:  Mr. Schroll is a 78 year old male with past medical history significant for multiple medical problems, i.e., coronary artery disease, history of MI x2 in the past, history of PTCA stenting to LAD, left circumflex, and RCA in the past, ischemic cardiomyopathy, history of congestive heart failure secondary to systolic dysfunction, history of bilateral pulmonary embolism in the past, history of paroxysmal AFib, hypertension, non-insulin-dependent diabetes mellitus, hypercholesteremia, history of DVT, remote tobacco abuse, chronic kidney disease stage III, hypothyroidism, history of pancreatitis in the past, GERD.  He came to the ER complaining of left- sided chest pain radiating to left shoulder and left arm off and on for the last few weeks.  The patient describes the chest pain as dull aching pressure, grade 5/10 associated with nausea and  abdominal pain.  The patient was seen in the ER at [facility] earlier this morning with recurrent abdominal pain, had x-rays and labs which were all negative and was sent home.  The patient had extensive workup for abdominal pain, including upper endoscopy, colonoscopy, and MRI to evaluate for bowel ischemia which were all negative.  The patient denies any urinary complaints.  Denies any  fever, chills cough, body aches. The patient had multiple ER visits with similar complaints in the past. The patient denies any palpitation, lightheadedness, or syncope.  Denies PND, orthopnea, or leg swelling.  EKG, the patient had old left bundle- branch block.  There were no new acute ischemic changes.  PHYSICAL EXAMINATION:  GENERAL:  He was alert, awake, oriented x3 in no acute distress. VITAL SIGNS:  Blood pressure was 142/70, pulse was 60 and regular. HEENT:  Conjunctivae were pink. NECK:  Supple.  No JVD.  No bruit. LUNGS:  Clear to auscultation without rhonchi or rales. CARDIOVASCULAR:  S1, S2 was normal.  There was soft systolic murmur. There was no S3 gallop. ABDOMEN:  Soft.  There was mild generalized tenderness.  Bowel sounds were present.  There was no guarding or rebound tenderness. EXTREMITIES:  There was no clubbing, cyanosis, or edema.  LABORATORY DATA:  Sodium was 138, potassium 3.7, BUN 18, creatinine 1.91, glucose was 87.  His 4 sets of cardiac enzymes were negative. Hemoglobin was 11.9, hematocrit 34.1, white count of 6.4.  The patient underwent Lexiscan Myoview stress test which showed mild-to-moderate focus of ischemia within the anteroseptal wall which is new from the prior exam.  There was septal hypokinesia.  EF has improved to 54%.  EKG showed normal sinus rhythm with left bundle-branch block.  Chest x-ray showed no acute cardiopulmonary process, hyperinflated lungs.  BRIEF HOSPITAL COURSE:  The patient was admitted to telemetry unit.  MI was ruled out by serial enzymes and EKG.  The patient subsequently underwent Lexiscan Myoview stress test, which showed anteroseptal wall ischemia.  Subsequently, the patient underwent left cardiac cath with selective left and right coronary angiography.  As per procedure report, the patient was noted to have patent stents with no evidence of obstructive coronary artery disease.  GI consultation was obtained because of  recurrent abdominal pain with Dr. Elnoria Howard.  The patient had extensive workup as outpatient which has been negative so far from GI point of view.  The patient does have chronic constipation and was started on Amitiza.  His abdominal pain has slightly improved.  The patient's groin is stable with no evidence of hematoma or bruit.  The patient has been ambulating in hallway without any problems.  The patient will be discharged home on above medications and will be followed up in my office in 1 week.  He will follow up with GI in 2 weeks.     Eduardo Osier. Sharyn Lull, M.D.     MNH/MEDQ  D:  02/21/2011  T:  02/21/2011  Job:  [id number]

## 2011-02-21 NOTE — Discharge Summary (Signed)
  Discharge summary dictated on 02/21/2011 dictation number is [phone]

## 2011-02-21 NOTE — Progress Notes (Signed)
   CARE MANAGEMENT NOTE 02/21/2011  Patient:  JAECE, DUCHARME   Account Number:  [id number]  Date Initiated:  02/21/2011  Documentation initiated by:  GRAVES-BIGELOW,Shontez Sermon  Subjective/Objective Assessment:   Pt admitted with Chest pain/abdominal pain. S/p cardiac cath. Plan for return to IDL facility with wife. CSW Verlon Au spoke to pt.     Action/Plan:   Per MD notes pt ambulating well. Per RN no needs for [facility] services. No needs asessed at this time. Per Rn daughter will pick pt up to transfer to IDL facility.   Anticipated DC Date:  02/21/2011   Anticipated DC Plan:  HOME/SELF CARE      DC Planning Services  CM consult      Choice offered to / List presented to:             Status of service:  Completed, signed off Medicare Important Message given?   (If response is "NO", the following Medicare IM given date fields will be blank) Date Medicare IM given:   Date Additional Medicare IM given:    Discharge Disposition:  HOME/SELF CARE  Per UR Regulation:    Comments:

## 2011-02-25 ENCOUNTER — Emergency Department (HOSPITAL_COMMUNITY)
Admission: EM | Admit: 2011-02-25 | Discharge: 2011-02-25 | Disposition: A | Discharge status: Home / Self Care | Payer: No Typology Code available for payment source | Attending: Emergency Medicine | Admitting: Emergency Medicine

## 2011-02-25 ENCOUNTER — Emergency Department (HOSPITAL_COMMUNITY): Payer: No Typology Code available for payment source

## 2011-02-25 ENCOUNTER — Encounter (HOSPITAL_COMMUNITY): Payer: Self-pay

## 2011-02-25 DIAGNOSIS — Z8673 Personal history of transient ischemic attack (TIA), and cerebral infarction without residual deficits: Secondary | ICD-10-CM | POA: Insufficient documentation

## 2011-02-25 DIAGNOSIS — Z862 Personal history of diseases of the blood and blood-forming organs and certain disorders involving the immune mechanism: Secondary | ICD-10-CM | POA: Insufficient documentation

## 2011-02-25 DIAGNOSIS — Z8639 Personal history of other endocrine, nutritional and metabolic disease: Secondary | ICD-10-CM | POA: Insufficient documentation

## 2011-02-25 DIAGNOSIS — S139XXA Sprain of joints and ligaments of unspecified parts of neck, initial encounter: Secondary | ICD-10-CM | POA: Insufficient documentation

## 2011-02-25 DIAGNOSIS — K219 Gastro-esophageal reflux disease without esophagitis: Secondary | ICD-10-CM | POA: Insufficient documentation

## 2011-02-25 DIAGNOSIS — I252 Old myocardial infarction: Secondary | ICD-10-CM | POA: Insufficient documentation

## 2011-02-25 DIAGNOSIS — M546 Pain in thoracic spine: Secondary | ICD-10-CM | POA: Insufficient documentation

## 2011-02-25 DIAGNOSIS — Z7982 Long term (current) use of aspirin: Secondary | ICD-10-CM | POA: Insufficient documentation

## 2011-02-25 DIAGNOSIS — Z794 Long term (current) use of insulin: Secondary | ICD-10-CM | POA: Insufficient documentation

## 2011-02-25 DIAGNOSIS — M542 Cervicalgia: Secondary | ICD-10-CM | POA: Insufficient documentation

## 2011-02-25 DIAGNOSIS — E039 Hypothyroidism, unspecified: Secondary | ICD-10-CM | POA: Insufficient documentation

## 2011-02-25 DIAGNOSIS — M545 Low back pain, unspecified: Secondary | ICD-10-CM | POA: Insufficient documentation

## 2011-02-25 DIAGNOSIS — E785 Hyperlipidemia, unspecified: Secondary | ICD-10-CM | POA: Insufficient documentation

## 2011-02-25 DIAGNOSIS — R110 Nausea: Secondary | ICD-10-CM | POA: Insufficient documentation

## 2011-02-25 DIAGNOSIS — IMO0002 Reserved for concepts with insufficient information to code with codable children: Secondary | ICD-10-CM | POA: Insufficient documentation

## 2011-02-25 DIAGNOSIS — Z79899 Other long term (current) drug therapy: Secondary | ICD-10-CM | POA: Insufficient documentation

## 2011-02-25 DIAGNOSIS — S161XXA Strain of muscle, fascia and tendon at neck level, initial encounter: Secondary | ICD-10-CM

## 2011-02-25 DIAGNOSIS — Z86711 Personal history of pulmonary embolism: Secondary | ICD-10-CM | POA: Insufficient documentation

## 2011-02-25 LAB — DIFFERENTIAL
Basophils Absolute: 0 10*3/uL (ref 0.0–0.1)
Basophils Relative: 0 % (ref 0–1)
Eosinophils Absolute: 0.1 10*3/uL (ref 0.0–0.7)
Eosinophils Relative: 2 % (ref 0–5)
Lymphocytes Relative: 26 % (ref 12–46)
Lymphs Abs: 1.5 K/uL (ref 0.7–4.0)
Monocytes Absolute: 0.8 K/uL (ref 0.1–1.0)
Monocytes Relative: 13 % — ABNORMAL HIGH (ref 3–12)
Neutro Abs: 3.4 10*3/uL (ref 1.7–7.7)
Neutrophils Relative %: 59 % (ref 43–77)

## 2011-02-25 LAB — CBC
HCT: 35.1 % — ABNORMAL LOW (ref 39.0–52.0)
Hemoglobin: 12 g/dL — ABNORMAL LOW (ref 13.0–17.0)
MCH: 28 pg (ref 26.0–34.0)
MCHC: 34.2 g/dL (ref 30.0–36.0)
MCV: 82 fL (ref 78.0–100.0)
Platelets: 134 10*3/uL — ABNORMAL LOW (ref 150–400)
RBC: 4.28 MIL/uL (ref 4.22–5.81)
RDW: 15.9 % — ABNORMAL HIGH (ref 11.5–15.5)
WBC: 5.7 K/uL (ref 4.0–10.5)

## 2011-02-25 LAB — BASIC METABOLIC PANEL WITH GFR
CO2: 24 meq/L (ref 19–32)
Calcium: 9.4 mg/dL (ref 8.4–10.5)
Creatinine, Ser: 2.32 mg/dL — ABNORMAL HIGH (ref 0.50–1.35)
Glucose, Bld: 154 mg/dL — ABNORMAL HIGH (ref 70–99)

## 2011-02-25 LAB — URINALYSIS, ROUTINE W REFLEX MICROSCOPIC
Bilirubin Urine: NEGATIVE
Glucose, UA: NEGATIVE mg/dL
Hgb urine dipstick: NEGATIVE
Ketones, ur: NEGATIVE mg/dL
Leukocytes, UA: NEGATIVE
Nitrite: NEGATIVE
Protein, ur: NEGATIVE mg/dL
Specific Gravity, Urine: 1.007 (ref 1.005–1.030)
Urobilinogen, UA: 0.2 mg/dL (ref 0.0–1.0)
pH: 7.5 (ref 5.0–8.0)

## 2011-02-25 LAB — PROTIME-INR
INR: 1.08 (ref 0.00–1.49)
Prothrombin Time: 14.2 seconds (ref 11.6–15.2)

## 2011-02-25 LAB — APTT: aPTT: 28 seconds (ref 24–37)

## 2011-02-25 LAB — BASIC METABOLIC PANEL
BUN: 17 mg/dL (ref 6–23)
Chloride: 103 mEq/L (ref 96–112)
GFR calc Af Amer: 29 mL/min — ABNORMAL LOW (ref >90)
GFR calc non Af Amer: 25 mL/min — ABNORMAL LOW (ref >90)
Potassium: 4.2 mEq/L (ref 3.5–5.1)
Sodium: 139 mEq/L (ref 135–145)

## 2011-02-25 MED ORDER — ACETAMINOPHEN 325 MG PO TABS
650.0000 mg | ORAL_TABLET | Freq: Once | ORAL | Status: AC
  Administered 2011-02-25: 650 mg via ORAL
  Filled 2011-02-25: qty 2

## 2011-02-25 MED ORDER — SODIUM CHLORIDE 0.9 % IV BOLUS (SEPSIS)
1000.0000 mL | Freq: Once | INTRAVENOUS | Status: AC
  Administered 2011-02-25: 1000 mL via INTRAVENOUS
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
## 2011-02-25 NOTE — ED Notes (Signed)
X-ray at bedside
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
## 2011-02-25 NOTE — ED Notes (Signed)
PT provided a blanket

## 2011-02-25 NOTE — ED Notes (Signed)
Pt's grandaughter called to pick him up - states she is on the way. Archie Patten @ [phone])
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
## 2011-02-25 NOTE — ED Notes (Signed)
Patient given urinal. And moved closer to call bell so he could reach it, no needs.
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
## 2011-02-25 NOTE — ED Provider Notes (Signed)
See my prior note  [name]. [name], MD 02/25/11 2235

## 2011-05-03 ENCOUNTER — Encounter (HOSPITAL_COMMUNITY): Payer: Self-pay | Admitting: Nurse Practitioner

## 2011-05-03 ENCOUNTER — Emergency Department (HOSPITAL_COMMUNITY): Payer: Medicare Other

## 2011-05-03 ENCOUNTER — Emergency Department (HOSPITAL_COMMUNITY)
Admission: EM | Admit: 2011-05-03 | Discharge: 2011-05-03 | Disposition: A | Discharge status: Home / Self Care | Payer: Medicare Other | Attending: Emergency Medicine | Admitting: Emergency Medicine

## 2011-05-03 DIAGNOSIS — K219 Gastro-esophageal reflux disease without esophagitis: Secondary | ICD-10-CM | POA: Insufficient documentation

## 2011-05-03 DIAGNOSIS — R109 Unspecified abdominal pain: Secondary | ICD-10-CM | POA: Insufficient documentation

## 2011-05-03 DIAGNOSIS — E039 Hypothyroidism, unspecified: Secondary | ICD-10-CM | POA: Insufficient documentation

## 2011-05-03 DIAGNOSIS — G8929 Other chronic pain: Secondary | ICD-10-CM | POA: Insufficient documentation

## 2011-05-03 DIAGNOSIS — I252 Old myocardial infarction: Secondary | ICD-10-CM | POA: Insufficient documentation

## 2011-05-03 DIAGNOSIS — Z8673 Personal history of transient ischemic attack (TIA), and cerebral infarction without residual deficits: Secondary | ICD-10-CM | POA: Insufficient documentation

## 2011-05-03 DIAGNOSIS — Z79899 Other long term (current) drug therapy: Secondary | ICD-10-CM | POA: Insufficient documentation

## 2011-05-03 DIAGNOSIS — R079 Chest pain, unspecified: Secondary | ICD-10-CM | POA: Insufficient documentation

## 2011-05-03 LAB — COMPREHENSIVE METABOLIC PANEL
AST: 42 U/L — ABNORMAL HIGH (ref 0–37)
Albumin: 3.7 g/dL (ref 3.5–5.2)
Alkaline Phosphatase: 117 U/L (ref 39–117)
BUN: 16 mg/dL (ref 6–23)
Creatinine, Ser: 1.59 mg/dL — ABNORMAL HIGH (ref 0.50–1.35)
Potassium: 3.6 mEq/L (ref 3.5–5.1)
Total Protein: 7.6 g/dL (ref 6.0–8.3)

## 2011-05-03 LAB — CBC
Hemoglobin: 13.7 g/dL (ref 13.0–17.0)
MCH: 28.2 pg (ref 26.0–34.0)
MCHC: 34.4 g/dL (ref 30.0–36.0)
RDW: 15.6 % — ABNORMAL HIGH (ref 11.5–15.5)

## 2011-05-03 LAB — DIFFERENTIAL
Basophils Absolute: 0 10*3/uL (ref 0.0–0.1)
Basophils Relative: 0 % (ref 0–1)
Eosinophils Absolute: 0.1 10*3/uL (ref 0.0–0.7)
Monocytes Relative: 10 % (ref 3–12)
Neutro Abs: 3.6 10*3/uL (ref 1.7–7.7)
Neutrophils Relative %: 57 % (ref 43–77)

## 2011-05-03 LAB — LIPASE, BLOOD: Lipase: 39 U/L (ref 11–59)

## 2011-05-03 MED ORDER — ONDANSETRON HCL 4 MG/2ML IJ SOLN
4.0000 mg | INTRAMUSCULAR | Status: DC | PRN
  Administered 2011-05-03: 4 mg via INTRAVENOUS
  Filled 2011-05-03: qty 2

## 2011-05-03 MED ORDER — MORPHINE SULFATE 4 MG/ML IJ SOLN
4.0000 mg | Freq: Once | INTRAMUSCULAR | Status: AC
  Administered 2011-05-03: 4 mg via INTRAVENOUS
  Filled 2011-05-03: qty 1

## 2011-05-03 MED ORDER — ASPIRIN 81 MG PO CHEW
324.0000 mg | CHEWABLE_TABLET | Freq: Once | ORAL | Status: AC
  Administered 2011-05-03: 324 mg via ORAL
  Filled 2011-05-03: qty 4

## 2011-05-03 MED ORDER — ONDANSETRON 4 MG PO TBDP
4.0000 mg | ORAL_TABLET | Freq: Once | ORAL | Status: AC
  Administered 2011-05-03: 4 mg via ORAL
  Filled 2011-05-03: qty 1

## 2011-05-03 MED ORDER — NITROGLYCERIN 0.4 MG SL SUBL: 0.4000 mg | SUBLINGUAL_TABLET | SUBLINGUAL | Status: DC | PRN

## 2011-05-03 MED ORDER — PANTOPRAZOLE SODIUM 40 MG PO TBEC
40.0000 mg | DELAYED_RELEASE_TABLET | Freq: Once | ORAL | Status: AC
  Administered 2011-05-03: 40 mg via ORAL
  Filled 2011-05-03: qty 1

## 2011-05-03 NOTE — ED Notes (Signed)
Patient resting with NAD and remains on monitor with sats of 97%.

## 2011-05-03 NOTE — ED Notes (Signed)
Patient c/o of abdominal pain and is having chest pain in center of chest radiating to and down right arm. EDP advised. Patient placed on monitor and sats of 99% RA. EKG captured and given to EDP.

## 2011-05-03 NOTE — ED Notes (Signed)
Pt c/o abd pain and nausea since yesterday. A&Ox4, resp e/u

## 2011-05-03 NOTE — ED Notes (Signed)
Patietn transported to [place].
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
## 2011-05-03 NOTE — Discharge Instructions (Signed)
Follow up with the Gastroenterologist or general surgeon listed above for further evaluation of your abdominal pain. Only use your pain medication for severe pain. Do not operate heavy machinery while on pain medication or muscle relaxer. Note that your pain medication contains acetaminophen (Tylenol) & its is not reccommended that you use additional acetaminophen (Tylenol) while taking this medication.   Abdominal Pain  Your exam might not show the exact reason you have abdominal pain. Since there are many different causes of abdominal pain, another checkup and more tests may be needed. It is very important to follow up for lasting (persistent) or worsening symptoms. A possible cause of abdominal pain in any person who still has his or her appendix is acute appendicitis. Appendicitis is often hard to diagnose. Normal blood tests, urine tests, ultrasound, and CT scans do not completely rule out early appendicitis or other causes of abdominal pain. Sometimes, only the changes that happen over time will allow appendicitis and other causes of abdominal pain to be determined. Other potential problems that may require surgery may also take time to become more apparent. Because of this, it is important that you follow all of the instructions below.   HOME CARE INSTRUCTIONS  Do not take laxatives unless directed by your caregiver. Rest as much as possible.  Do not eat solid food until your pain is gone: A diet of water, weak decaffeinated tea, broth or bouillon, gelatin, oral rehydration solutions (ORS), frozen ice pops, or ice chips may be helpful.  When pain is gone: Start a light diet (dry toast, crackers, applesauce, or white rice). Increase the diet slowly as long as it does not bother you. Eat no dairy products (including cheese and eggs) and no spicy, fatty, fried, or high-fiber foods.  Use no alcohol, caffeine, or cigarettes.  Take your regular medicines unless your caregiver told you not to.  Take any  prescribed medicine as directed.   SEEK IMMEDIATE MEDICAL CARE IF:  The pain does not go away.  You have a fever >101 that persists You keep throwing up (vomiting) or cannot drink liquids.  The pain becomes localized (Pain in the right side could possibly be appendicitis. In an adult, pain in the left lower portion of the abdomen could be colitis or diverticulitis). You pass bloody or black tarry stools.  You have shaking chills.  There is blood in your vomit or you see blood in your bowel movements.  Your bowel movements stop (become blocked) or you cannot pass gas.  You have bloody, frequent, or painful urination.  You have yellow discoloration in the skin or whites of the eyes.  Your stomach becomes bloated or bigger.  You have dizziness or fainting.  You have chest or back pain.

## 2011-05-03 NOTE — ED Notes (Signed)
First meeting with patient. Patient states he started having abdominal pain yesterday with nausea and vomiting and awaken today with worse pain. Patient resting and denies abdominal pain and denies nausea at this time.

## 2011-05-03 NOTE — ED Provider Notes (Signed)
Medical screening examination/treatment/procedure(s) were conducted as a shared visit with non-physician practitioner(s) and myself.  I personally evaluated the patient during the encounter  RUQ pain with nausea that is worsened by food.  Developed CP during ED stay.  LBBB on EKG, recent cath 1/28 by Dr Sharyn Lull showing patent stent. D/w Dr. Sharyn Lull who knows patient well. Patient has had extensive cardiac and GI workup. Stable for discharge if labs and troponin unremarkable and tolerating PO.  Glynn Octave, MD 05/03/11 1902

## 2011-06-06 ENCOUNTER — Emergency Department (HOSPITAL_COMMUNITY): Payer: Medicare Other

## 2011-06-06 ENCOUNTER — Emergency Department (HOSPITAL_COMMUNITY)
Admission: EM | Admit: 2011-06-06 | Discharge: 2011-06-06 | Disposition: A | Discharge status: Home / Self Care | Payer: Medicare Other | Attending: Emergency Medicine | Admitting: Emergency Medicine

## 2011-06-06 DIAGNOSIS — K219 Gastro-esophageal reflux disease without esophagitis: Secondary | ICD-10-CM | POA: Insufficient documentation

## 2011-06-06 DIAGNOSIS — Z8739 Personal history of other diseases of the musculoskeletal system and connective tissue: Secondary | ICD-10-CM | POA: Insufficient documentation

## 2011-06-06 DIAGNOSIS — I252 Old myocardial infarction: Secondary | ICD-10-CM | POA: Insufficient documentation

## 2011-06-06 DIAGNOSIS — R197 Diarrhea, unspecified: Secondary | ICD-10-CM | POA: Insufficient documentation

## 2011-06-06 DIAGNOSIS — E039 Hypothyroidism, unspecified: Secondary | ICD-10-CM | POA: Insufficient documentation

## 2011-06-06 DIAGNOSIS — Z79899 Other long term (current) drug therapy: Secondary | ICD-10-CM | POA: Insufficient documentation

## 2011-06-06 DIAGNOSIS — Z8673 Personal history of transient ischemic attack (TIA), and cerebral infarction without residual deficits: Secondary | ICD-10-CM | POA: Insufficient documentation

## 2011-06-06 LAB — URINALYSIS, ROUTINE W REFLEX MICROSCOPIC
Glucose, UA: NEGATIVE mg/dL
Ketones, ur: NEGATIVE mg/dL
Leukocytes, UA: NEGATIVE
Nitrite: NEGATIVE
Specific Gravity, Urine: 1.009 (ref 1.005–1.030)
pH: 7 (ref 5.0–8.0)

## 2011-06-06 LAB — CBC
HCT: 37.8 % — ABNORMAL LOW (ref 39.0–52.0)
MCHC: 35.2 g/dL (ref 30.0–36.0)
MCV: 79.9 fL (ref 78.0–100.0)
Platelets: 166 10*3/uL (ref 150–400)
RDW: 15.5 % (ref 11.5–15.5)
WBC: 7.5 10*3/uL (ref 4.0–10.5)

## 2011-06-06 LAB — COMPREHENSIVE METABOLIC PANEL
AST: 26 U/L (ref 0–37)
Albumin: 3.3 g/dL — ABNORMAL LOW (ref 3.5–5.2)
BUN: 18 mg/dL (ref 6–23)
Calcium: 8.9 mg/dL (ref 8.4–10.5)
Chloride: 105 mEq/L (ref 96–112)
Creatinine, Ser: 1.47 mg/dL — ABNORMAL HIGH (ref 0.50–1.35)
Total Protein: 7.1 g/dL (ref 6.0–8.3)

## 2011-06-06 LAB — LIPASE, BLOOD: Lipase: 53 U/L (ref 11–59)

## 2011-06-06 NOTE — ED Provider Notes (Signed)
History     CSN: [id number]  Arrival date & time 06/06/11  1112   First MD Initiated Contact with Patient 06/06/11 1145      Chief Complaint  Patient presents with  . Diarrhea    (Consider location/radiation/quality/duration/timing/severity/associated sxs/prior treatment) The history is provided by the patient.  78 y/o M with PMH CAD s/p 2-vessel stenting, GERD presents to ED with c/c of post-prandial nausea, abdominal pain, and diarrhea for 4-5 months. Has had multiple evals for same in both ED and by GI with endoscopy, colonoscopy, and MRI showing no specific cause. Called EMS for transport today as his diarrhea began in the early morning hours and continued for several hours where it typically only lasts for a couple of episodes. Diarrhea described as watery, non-bloody. Nausea but no vomiting. Chronic abdominal pain is unchanged, described as radiating from epigastric region to suprapubic region, absent at time of examination. Has had intermittent diarrhea and constipation during the course of his prolonged symptoms. Recent abx treatment with ceftin starting 5 days ago. Also recently began taking amitzak for constipation. Eating makes his symptoms worse, nothing makes them better.      Past Medical History  Diagnosis Date  . GERD (gastroesophageal reflux disease)   . Hypothyroidism   . Renal disorder   . Chronic back pain   . Hx of heart artery stent   . Myocardial infarction   . Stroke   . Arthritis   . Gout   . Pulmonary embolism, bilateral 2010  . Hyperlipemia     Past Surgical History  Procedure Date  . Appendectomy   . Cardiac catheterization   . Coronary angioplasty with stent placement   . Esophagogastroduodenoscopy 12/22/2010    Procedure: ESOPHAGOGASTRODUODENOSCOPY (EGD);  Surgeon: Theda Belfast;  Location: [facility];  Service: Endoscopy;  Laterality: N/A;    No family history on file.  History  Substance Use Topics  . Smoking status: Former Games developer    . Smokeless tobacco: Not on file  . Alcohol Use: No      Review of Systems 10 systems reviewed and are negative for acute change except as noted in the HPI.  Allergies  Review of patient's allergies indicates no known allergies.  Home Medications   Current Outpatient Rx  Name Route Sig Dispense Refill  . ALBUTEROL SULFATE HFA 108 (90 BASE) MCG/ACT IN AERS Inhalation Inhale 2 puffs into the lungs every 6 (six) hours as needed. For shortness of breath    . AMIODARONE HCL 200 MG PO TABS Oral Take 200 mg by mouth daily.     Marland Kitchen AMLODIPINE BESYLATE 5 MG PO TABS Oral Take 5 mg by mouth daily.    . ASPIRIN 81 MG PO TBEC Oral Take 81 mg by mouth daily.    . BECLOMETHASONE DIPROPIONATE 80 MCG/ACT IN AERS Inhalation Inhale 1 puff into the lungs 2 (two) times daily as needed. For shortness of breath    . CARVEDILOL 3.125 MG PO TABS Oral Take 3.125 mg by mouth 2 (two) times daily with a meal.    . DEXLANSOPRAZOLE 60 MG PO CPDR Oral Take 60 mg by mouth daily.     Marland Kitchen FLUTICASONE PROPIONATE 50 MCG/ACT NA SUSP Nasal Place 1 spray into the nose at bedtime. For stuffy nose and dryness    . FUROSEMIDE 40 MG PO TABS Oral Take 40 mg by mouth daily.    Marland Kitchen GLIMEPIRIDE 1 MG PO TABS Oral Take 1 mg by mouth daily before breakfast.     .  ISOSORBIDE MONONITRATE ER 60 MG PO TB24 Oral Take 60 mg by mouth 2 (two) times daily.     Marland Kitchen LEVOTHYROXINE SODIUM 50 MCG PO TABS Oral Take 50 mcg by mouth daily.     Marland Kitchen LINACLOTIDE 290 MCG PO CAPS Oral Take 1 tablet by mouth daily.    Marland Kitchen NITROGLYCERIN 0.4 MG SL SUBL Sublingual Place 0.4 mg under the tongue every 5 (five) minutes as needed. For chest pain     . ROSUVASTATIN CALCIUM 10 MG PO TABS Oral Take 10 mg by mouth daily.     Marland Kitchen TAMSULOSIN HCL 0.4 MG PO CAPS Oral Take 0.4 mg by mouth at bedtime.       BP 185/74  Pulse 70  Temp(Src) 97.9 F (36.6 C) (Oral)  Resp 20  SpO2 99%  Physical Exam  Nursing note reviewed. Constitutional: He appears well-developed. No distress.        Vital signs are reviewed and are normal except for HTN, of which pt has a history.  HENT:  Head: Normocephalic and atraumatic.  Right Ear: External ear normal.  Left Ear: External ear normal.       Oral mucosa moist  Eyes: Pupils are equal, round, and reactive to light.  Neck: Neck supple.  Cardiovascular: Normal rate and regular rhythm.   Pulmonary/Chest: Effort normal and breath sounds normal. No respiratory distress. He has no wheezes. He exhibits no tenderness.  Abdominal: Soft. Bowel sounds are normal. He exhibits no distension. There is no tenderness. There is no rebound and no guarding.  Musculoskeletal: He exhibits no edema and no tenderness.  Neurological: He is alert.       MS appears appropriate for pt and situation. MAEW  Skin: Skin is warm and dry.  Psychiatric: He has a normal mood and affect.    ED Course  Procedures (including critical care time)  Labs Reviewed  CBC - Abnormal; Notable for the following:    HCT 37.8 (*)    All other components within normal limits  COMPREHENSIVE METABOLIC PANEL - Abnormal; Notable for the following:    Creatinine, Ser 1.47 (*)    Albumin 3.3 (*)    GFR calc non Af Amer 44 (*)    GFR calc Af Amer 51 (*)    All other components within normal limits  LIPASE, BLOOD  URINALYSIS, ROUTINE W REFLEX MICROSCOPIC  CLOSTRIDIUM DIFFICILE BY PCR   Dg Abd Acute W/chest  06/06/2011  *RADIOLOGY REPORT*  Clinical Data: Abdominal pain.  Constipation.  ACUTE ABDOMEN SERIES (ABDOMEN 2 VIEW & CHEST 1 VIEW)  Comparison: Acute abdominal series 01/11/2011.  Findings: Lung volumes are low.  There are bibasilar linear opacities favored to represent areas of subsegmental atelectasis. Mild bilateral apical pleuroparenchymal scarring (somewhat nodular), unchanged.  No definite consolidative airspace disease. No pleural effusions.  Heart size is upper limits of normal. Mediastinal contours are unremarkable.  Atherosclerotic calcifications within the arch  of the aorta.  No pneumoperitoneum.  Supine and upright views of the abdomen demonstrate gas and stool scattered throughout the colon extending to the level of the rectum.  No pathologic distension of small bowel as noted. Multiple phleboliths are incidentally noted within the pelvis.  IMPRESSION: 1.  Nonobstructive bowel gas pattern. 2.  No pneumoperitoneum. 3.  Low lung volumes without radiographic evidence of acute cardiopulmonary disease. 4.  Atherosclerosis.  Original Report Authenticated By: Florencia Reasons, M.D.     Dx 1: Diarrhea   MDM  Benign examination in patient with chronic  GI difficulties and multiple prior evals with repeated work-ups. Labs with only stable mild renal insufficiency. Acute abd series unremarkable. Pt has had no continued diarrhea in ED, though a stool c. Diff PCR initially ordered. There does not appear to be any emergency medical condition causing pt symptoms and I feel he is safe for d/c home at this point. Discussed at length with pt the need to f/u with primary care provider and GI regarding persistent symptoms. Also advised to collect a stool sample this evening if diarrhea returns and ask PCP if he would like a sample sent. Return precautions, to include fever, changing abd pain, or bloody stool, were discussed. Pt voices understanding.        Shaaron Adler, [place] 06/06/11 1555

## 2011-06-06 NOTE — Discharge Instructions (Signed)
As we discussed, you should try to catch a sample if you have repeated diarrhea this evening. Call your doctor to see if they would like to send off a sample for culture.      Chronic Diarrhea Diarrhea is loose, watery stools. Having diarrhea means passing loose stools 3 or more times a day. Diarrhea that lasts longer than 4 weeks is considered long-lasting (chronic). Symptoms of chronic diarrhea may be continual or may come and go. People of all ages can get diarrhea. Body fluid loss (dehydration) may occur as a result of diarrhea. This means the body does not have as many fluids and salts (electrolytes) as it needs. CAUSES  There are many causes of chronic diarrhea. Causes may be different for children and adults. The various causes can be grouped into 2 categories: diarrhea caused by an infection and diarrhea not caused by an infection. Sometimes, the cause is unknown. Diarrhea caused by an infection may result from:  Parasites.   Bacteria.   Viral infections.  Diarrhea not caused by an infection may result from:  Irritable bowel syndrome.   Reaction to medicines, such as antibiotics, cancer drugs, blood pressure medicines, and antacids.   Intestinal disease (Crohn's disease, ulcerative colitis, celiac disease).   Food allergies or sensitivity to additives (fructose, lactose, sugar substitutes).   Tumors.   Diabetes, thyroid disease, and other endocrine diseases.   Reduced blood flow to the intestine.   Previous surgery or radiation of the abdomen or gastrointestinal tract.  Risk factors for chronic diarrhea include:  Having a severely weakened immune system, such as from HIV/AIDS.   Taking certain types of cancer-fighting drugs (chemotherapy) or other medicines.   A recent organ transplant.   Having a portion of the stomach removed.   Traveling to countries where food and water supplies are often contaminated.  SYMPTOMS  In addition to frequent, loose stools,  diarrhea may cause:  Cramping.   Abdominal pain.   Nausea.   Urgent need to use the bathroom, or loss of bowel control.  If dehydration occurs, problems include:  Thirst.   Less frequent urination.   Dark urine.   Dry skin.   Fatigue.   Dizziness.  Infections that cause diarrhea may also cause a fever, chills, or bloody stools. DIAGNOSIS  Diagnosis may be difficult. Your caregiver must take a careful history and perform a physical exam. Tests given are based on your symptoms and history. Tests may include:  Blood or stool tests, in which 3 or more stool samples may be examined. Stool cultures may be used to test for bacteria or parasites.   X-rays.   A procedure in which a thin tube is inserted into the mouth or rectum (endoscopy). This allows the caregiver to look inside the intestine.  TREATMENT   Diarrhea caused by an infection can often be treated with antibiotics.   Diarrhea not caused by an infection is more difficult to diagnose and treat. Long-term medicine use or surgery may be required. Specific treatment should be discussed with your caregiver.   If the cause cannot determined, treatment to relieve symptoms includes:   Preventing dehydration. Serious health problems can occur if you do not maintain proper fluid levels. Many oral rehydration solutions (ORS) are available at drug stores. Ask your caregiver what product is best for you.   Not drinking beverages that contain caffeine (tea, coffee, soft drinks).   Not drinking alcohol. It causes dehydration.   Not relying on sports drinks and broths alone  to maintain proper fluid levels. They should not be used to prevent severe dehydration.   Maintaining well-balanced nutrition. This may help you recover faster.  PREVENTION   Drink clean or purified water.   Use proper food handling techniques.   Maintain proper hand-washing habits.  HOME CARE INSTRUCTIONS   Avoid:   Caffeine.   Greasy foods.    High fiber.   If you have problems digesting lactose during or after an episode of diarrhea, you might want to try yogurt. Yogurt is often better tolerated, because it has less lactose than milk. Yogurt with active, live bacterial cultures may even help you recover faster.  SEEK MEDICAL CARE IF:  The person with diarrhea is an otherwise healthy adult and has:  Signs of dehydration.   Diarrhea for more than 2 days.   Severe pain in the abdomen or rectum.   An oral temperature above 102 F (38.9 C).   Stools containing blood or pus.   Stools that are black and tarry.  SEEK IMMEDIATE MEDICAL CARE IF:  The person with diarrhea is a child, elderly person, or has a weakened immune system and has:  Signs of dehydration.   Diarrhea for more than 1 day.   Severe pain in the abdomen or rectum.   An oral temperature above 102 F (38.9 C), not controlled by medicine.   Stools containing blood or pus.   Stools that are black and tarry.  Document Released: 03/31/2003 Document Revised: 12/28/2010 Document Reviewed: 05/27/2009 [facility] Patient Information 2012 [place], [place].

## 2011-06-06 NOTE — ED Provider Notes (Signed)
Patient here for diarrhea and constipation for the for the past 4 months. He states his last normal bowel movement was 2 weeks ago and sometimes he passes small balls or the caliber of his stool but small and. He also reports diarrhea. He states about 3 AM this morning he has had about 6-7 episodes of loose watery diarrhea without vomiting but he does have nausea. He relates sometimes he has upper abdominal pain that radiates down to his lower abdomen into his groin about an hour after he eats. He states it only happens at night. Patient has been seen by Dr. Elnoria Howard GI and has had endoscopy colonoscopy and from what patient describes MRI that were fine. He relates he was seen by his rheumatologist yesterday and was told "you're taking too much medicine" he relates his doctor Dr. Kendal Hymen and Dr. Rosey Bath he does not want him to change his medicine. Patient has coronary artery disease and has 2 stents. Patient was recently started on Amitzak, linzess, ceftin.   Pt is alert, cooperative, abdomen was soft and nontender.   Medical screening examination/treatment/procedure(s) were conducted as a shared visit with non-physician practitioner(s) and myself.  I personally evaluated the patient during the encounter Devoria Albe, MD, Franz Dell, MD 06/06/11 [id number]

## 2011-06-06 NOTE — ED Provider Notes (Signed)
See prior note   Ward Givens, MD 06/06/11 [phone]

## 2011-06-06 NOTE — ED Notes (Signed)
[id number]<QM> Expected date:<BR> Expected time:<BR> Means of arrival:<BR> Comments:<BR> Ems/ abd pain after eating chicken

## 2011-06-06 NOTE — ED Notes (Signed)
Pt reports diarrhea. Is taking amitiza bid with meals for constipation. Pt reports diarrhea for 5 months.

## 2011-07-11 ENCOUNTER — Other Ambulatory Visit: Payer: Self-pay

## 2011-07-11 ENCOUNTER — Inpatient Hospital Stay (HOSPITAL_COMMUNITY)
Admission: AD | Admit: 2011-07-11 | Discharge: 2011-07-23 | DRG: 280 | Disposition: A | Discharge status: Home / Self Care | Payer: Medicare Other | Source: Ambulatory Visit | Attending: Cardiovascular Disease | Admitting: Cardiovascular Disease

## 2011-07-11 DIAGNOSIS — I2589 Other forms of chronic ischemic heart disease: Secondary | ICD-10-CM | POA: Diagnosis present

## 2011-07-11 DIAGNOSIS — D649 Anemia, unspecified: Secondary | ICD-10-CM | POA: Diagnosis present

## 2011-07-11 DIAGNOSIS — E785 Hyperlipidemia, unspecified: Secondary | ICD-10-CM | POA: Diagnosis present

## 2011-07-11 DIAGNOSIS — M129 Arthropathy, unspecified: Secondary | ICD-10-CM | POA: Diagnosis present

## 2011-07-11 DIAGNOSIS — Z7982 Long term (current) use of aspirin: Secondary | ICD-10-CM

## 2011-07-11 DIAGNOSIS — N183 Chronic kidney disease, stage 3 unspecified: Secondary | ICD-10-CM | POA: Diagnosis present

## 2011-07-11 DIAGNOSIS — E1129 Type 2 diabetes mellitus with other diabetic kidney complication: Secondary | ICD-10-CM | POA: Diagnosis present

## 2011-07-11 DIAGNOSIS — K2970 Gastritis, unspecified, without bleeding: Secondary | ICD-10-CM | POA: Diagnosis present

## 2011-07-11 DIAGNOSIS — Z86718 Personal history of other venous thrombosis and embolism: Secondary | ICD-10-CM

## 2011-07-11 DIAGNOSIS — K2100 Gastro-esophageal reflux disease with esophagitis, without bleeding: Secondary | ICD-10-CM | POA: Diagnosis present

## 2011-07-11 DIAGNOSIS — E039 Hypothyroidism, unspecified: Secondary | ICD-10-CM | POA: Diagnosis present

## 2011-07-11 DIAGNOSIS — Z8673 Personal history of transient ischemic attack (TIA), and cerebral infarction without residual deficits: Secondary | ICD-10-CM

## 2011-07-11 DIAGNOSIS — I10 Essential (primary) hypertension: Secondary | ICD-10-CM | POA: Diagnosis present

## 2011-07-11 DIAGNOSIS — I2510 Atherosclerotic heart disease of native coronary artery without angina pectoris: Secondary | ICD-10-CM | POA: Diagnosis present

## 2011-07-11 DIAGNOSIS — I252 Old myocardial infarction: Secondary | ICD-10-CM

## 2011-07-11 DIAGNOSIS — K5900 Constipation, unspecified: Secondary | ICD-10-CM | POA: Diagnosis present

## 2011-07-11 DIAGNOSIS — R112 Nausea with vomiting, unspecified: Secondary | ICD-10-CM | POA: Diagnosis present

## 2011-07-11 DIAGNOSIS — J189 Pneumonia, unspecified organism: Secondary | ICD-10-CM | POA: Diagnosis not present

## 2011-07-11 DIAGNOSIS — I5020 Unspecified systolic (congestive) heart failure: Secondary | ICD-10-CM | POA: Diagnosis present

## 2011-07-11 DIAGNOSIS — I82409 Acute embolism and thrombosis of unspecified deep veins of unspecified lower extremity: Secondary | ICD-10-CM | POA: Diagnosis not present

## 2011-07-11 DIAGNOSIS — I509 Heart failure, unspecified: Secondary | ICD-10-CM | POA: Diagnosis present

## 2011-07-11 DIAGNOSIS — Z79899 Other long term (current) drug therapy: Secondary | ICD-10-CM

## 2011-07-11 DIAGNOSIS — I214 Non-ST elevation (NSTEMI) myocardial infarction: Principal | ICD-10-CM | POA: Diagnosis present

## 2011-07-11 DIAGNOSIS — I2699 Other pulmonary embolism without acute cor pulmonale: Secondary | ICD-10-CM | POA: Diagnosis not present

## 2011-07-11 DIAGNOSIS — R079 Chest pain, unspecified: Secondary | ICD-10-CM

## 2011-07-11 DIAGNOSIS — M109 Gout, unspecified: Secondary | ICD-10-CM | POA: Diagnosis present

## 2011-07-11 HISTORY — DX: Pneumonia, unspecified organism: J18.9

## 2011-07-11 HISTORY — DX: Acute embolism and thrombosis of unspecified deep veins of unspecified lower extremity: I82.409

## 2011-07-11 HISTORY — DX: Unspecified asthma, uncomplicated: J45.909

## 2011-07-11 HISTORY — DX: Angina pectoris, unspecified: I20.9

## 2011-07-11 LAB — CBC
HCT: 39.3 % (ref 39.0–52.0)
Hemoglobin: 13.9 g/dL (ref 13.0–17.0)
MCH: 28.1 pg (ref 26.0–34.0)
MCHC: 35.4 g/dL (ref 30.0–36.0)
MCV: 79.4 fL (ref 78.0–100.0)

## 2011-07-11 LAB — GLUCOSE, CAPILLARY
Glucose-Capillary: 80 mg/dL (ref 70–99)
Glucose-Capillary: 138 mg/dL — ABNORMAL HIGH (ref 70–99)

## 2011-07-11 LAB — DIFFERENTIAL
Basophils Relative: 0 % (ref 0–1)
Eosinophils Absolute: 0.1 10*3/uL (ref 0.0–0.7)
Monocytes Absolute: 0.8 10*3/uL (ref 0.1–1.0)
Monocytes Relative: 11 % (ref 3–12)

## 2011-07-11 LAB — COMPREHENSIVE METABOLIC PANEL
Albumin: 3.5 g/dL (ref 3.5–5.2)
BUN: 17 mg/dL (ref 6–23)
Creatinine, Ser: 1.45 mg/dL — ABNORMAL HIGH (ref 0.50–1.35)
GFR calc Af Amer: 51 mL/min — ABNORMAL LOW (ref >90)
Total Bilirubin: 0.4 mg/dL (ref 0.3–1.2)
Total Protein: 7.4 g/dL (ref 6.0–8.3)

## 2011-07-11 LAB — PRO B NATRIURETIC PEPTIDE: Pro B Natriuretic peptide (BNP): 252.1 pg/mL (ref 0–450)

## 2011-07-11 LAB — CARDIAC PANEL(CRET KIN+CKTOT+MB+TROPI): Troponin I: <0.3 ng/mL (ref <0.30)

## 2011-07-11 MED ORDER — FUROSEMIDE 40 MG PO TABS
40.0000 mg | ORAL_TABLET | Freq: Every day | ORAL | Status: DC
  Administered 2011-07-11 – 2011-07-23 (×13): 40 mg via ORAL
  Filled 2011-07-11 (×15): qty 1

## 2011-07-11 MED ORDER — LEVOTHYROXINE SODIUM 50 MCG PO TABS
50.0000 ug | ORAL_TABLET | Freq: Every day | ORAL | Status: DC
  Administered 2011-07-12 – 2011-07-23 (×11): 50 ug via ORAL
  Filled 2011-07-11 (×16): qty 1

## 2011-07-11 MED ORDER — ATORVASTATIN CALCIUM 20 MG PO TABS
20.0000 mg | ORAL_TABLET | Freq: Every day | ORAL | Status: DC
  Administered 2011-07-11 – 2011-07-22 (×12): 20 mg via ORAL
  Filled 2011-07-11 (×15): qty 1

## 2011-07-11 MED ORDER — AMLODIPINE BESYLATE 5 MG PO TABS
5.0000 mg | ORAL_TABLET | Freq: Every day | ORAL | Status: DC
  Administered 2011-07-11 – 2011-07-19 (×9): 5 mg via ORAL
  Filled 2011-07-11 (×12): qty 1

## 2011-07-11 MED ORDER — SODIUM CHLORIDE 0.9 % IV SOLN
250.0000 mL | INTRAVENOUS | Status: DC | PRN
  Administered 2011-07-17: 250 mL via INTRAVENOUS

## 2011-07-11 MED ORDER — FLUTICASONE PROPIONATE HFA 44 MCG/ACT IN AERO
1.0000 | INHALATION_SPRAY | Freq: Two times a day (BID) | RESPIRATORY_TRACT | Status: DC
  Administered 2011-07-11 – 2011-07-23 (×22): 1 via RESPIRATORY_TRACT
  Filled 2011-07-11: qty 10.6

## 2011-07-11 MED ORDER — ISOSORBIDE MONONITRATE ER 60 MG PO TB24
60.0000 mg | ORAL_TABLET | Freq: Two times a day (BID) | ORAL | Status: DC
  Administered 2011-07-11 – 2011-07-17 (×12): 60 mg via ORAL
  Filled 2011-07-11 (×13): qty 1

## 2011-07-11 MED ORDER — NITROGLYCERIN 0.4 MG SL SUBL: 0.4000 mg | SUBLINGUAL_TABLET | SUBLINGUAL | Status: DC | PRN

## 2011-07-11 MED ORDER — ASPIRIN EC 81 MG PO TBEC
81.0000 mg | DELAYED_RELEASE_TABLET | Freq: Every day | ORAL | Status: DC
  Administered 2011-07-12 – 2011-07-23 (×12): 81 mg via ORAL
  Filled 2011-07-11 (×14): qty 1

## 2011-07-11 MED ORDER — INSULIN ASPART 100 UNIT/ML ~~LOC~~ SOLN
0.0000 [IU] | Freq: Three times a day (TID) | SUBCUTANEOUS | Status: DC
  Administered 2011-07-12: 2 [IU] via SUBCUTANEOUS
  Administered 2011-07-12: 3 [IU] via SUBCUTANEOUS
  Administered 2011-07-14 (×2): 2 [IU] via SUBCUTANEOUS
  Administered 2011-07-15 – 2011-07-16 (×2): 3 [IU] via SUBCUTANEOUS
  Administered 2011-07-17: 2 [IU] via SUBCUTANEOUS
  Administered 2011-07-17: 3 [IU] via SUBCUTANEOUS
  Administered 2011-07-18 – 2011-07-22 (×4): 2 [IU] via SUBCUTANEOUS

## 2011-07-11 MED ORDER — ALBUTEROL SULFATE HFA 108 (90 BASE) MCG/ACT IN AERS: 2.0000 | INHALATION_SPRAY | Freq: Four times a day (QID) | RESPIRATORY_TRACT | Status: DC | PRN

## 2011-07-11 MED ORDER — ALPRAZOLAM 0.25 MG PO TABS
0.2500 mg | ORAL_TABLET | Freq: Two times a day (BID) | ORAL | Status: DC | PRN
  Administered 2011-07-18 – 2011-07-23 (×2): 0.25 mg via ORAL
  Filled 2011-07-11 (×2): qty 1

## 2011-07-11 MED ORDER — BISACODYL 10 MG RE SUPP
10.0000 mg | Freq: Every day | RECTAL | Status: DC | PRN
  Administered 2011-07-12 – 2011-07-22 (×5): 10 mg via RECTAL
  Filled 2011-07-11 (×6): qty 1

## 2011-07-11 MED ORDER — ACETAMINOPHEN 325 MG PO TABS
650.0000 mg | ORAL_TABLET | ORAL | Status: DC | PRN
  Administered 2011-07-15 – 2011-07-17 (×2): 650 mg via ORAL
  Filled 2011-07-11 (×3): qty 2

## 2011-07-11 MED ORDER — NITROGLYCERIN 0.4 MG SL SUBL
0.4000 mg | SUBLINGUAL_TABLET | SUBLINGUAL | Status: DC | PRN
  Administered 2011-07-11 (×3): 0.4 mg via SUBLINGUAL

## 2011-07-11 MED ORDER — TAMSULOSIN HCL 0.4 MG PO CAPS
0.4000 mg | ORAL_CAPSULE | Freq: Every day | ORAL | Status: DC
  Administered 2011-07-11 – 2011-07-22 (×12): 0.4 mg via ORAL
  Filled 2011-07-11 (×15): qty 1

## 2011-07-11 MED ORDER — SODIUM CHLORIDE 0.9 % IJ SOLN
3.0000 mL | Freq: Two times a day (BID) | INTRAMUSCULAR | Status: DC
  Administered 2011-07-11 – 2011-07-23 (×17): 3 mL via INTRAVENOUS

## 2011-07-11 MED ORDER — ASPIRIN 300 MG RE SUPP
300.0000 mg | RECTAL | Status: AC
  Filled 2011-07-11: qty 1

## 2011-07-11 MED ORDER — GLIMEPIRIDE 1 MG PO TABS
1.0000 mg | ORAL_TABLET | Freq: Every day | ORAL | Status: DC
  Administered 2011-07-12 – 2011-07-23 (×10): 1 mg via ORAL
  Filled 2011-07-11 (×15): qty 1

## 2011-07-11 MED ORDER — ASPIRIN 81 MG PO CHEW
324.0000 mg | CHEWABLE_TABLET | ORAL | Status: AC
  Filled 2011-07-11: qty 4

## 2011-07-11 MED ORDER — OXYCODONE-ACETAMINOPHEN 5-325 MG PO TABS
1.0000 | ORAL_TABLET | Freq: Four times a day (QID) | ORAL | Status: DC | PRN
  Administered 2011-07-11: 1 via ORAL
  Administered 2011-07-12 – 2011-07-13 (×3): 2 via ORAL
  Administered 2011-07-14: 1 via ORAL
  Administered 2011-07-14: 2 via ORAL
  Filled 2011-07-11: qty 1
  Filled 2011-07-11 (×3): qty 2
  Filled 2011-07-11 (×2): qty 1
  Filled 2011-07-11 (×2): qty 2

## 2011-07-11 MED ORDER — AMIODARONE HCL 200 MG PO TABS
200.0000 mg | ORAL_TABLET | Freq: Every day | ORAL | Status: DC
  Administered 2011-07-12 – 2011-07-23 (×12): 200 mg via ORAL
  Filled 2011-07-11 (×14): qty 1

## 2011-07-11 MED ORDER — CARVEDILOL 3.125 MG PO TABS
3.1250 mg | ORAL_TABLET | Freq: Two times a day (BID) | ORAL | Status: DC
  Administered 2011-07-11 – 2011-07-23 (×23): 3.125 mg via ORAL
  Filled 2011-07-11 (×30): qty 1

## 2011-07-11 MED ORDER — ASPIRIN 81 MG PO TBEC: 81.0000 mg | DELAYED_RELEASE_TABLET | Freq: Every day | ORAL | Status: DC

## 2011-07-11 MED ORDER — PANTOPRAZOLE SODIUM 40 MG PO TBEC
40.0000 mg | DELAYED_RELEASE_TABLET | Freq: Every day | ORAL | Status: DC
  Administered 2011-07-11 – 2011-07-23 (×13): 40 mg via ORAL
  Filled 2011-07-11 (×11): qty 1

## 2011-07-11 MED ORDER — SODIUM CHLORIDE 0.9 % IJ SOLN: 3.0000 mL | INTRAMUSCULAR | Status: DC | PRN

## 2011-07-11 MED ORDER — ONDANSETRON HCL 4 MG/2ML IJ SOLN
4.0000 mg | Freq: Four times a day (QID) | INTRAMUSCULAR | Status: DC | PRN
  Administered 2011-07-11 – 2011-07-23 (×6): 4 mg via INTRAVENOUS
  Filled 2011-07-11 (×5): qty 2

## 2011-07-11 NOTE — H&P (Signed)
[name] is an 78 y.o. male.   Chief Complaint: Chest pain and abdominal pain HPI: 78 years old black male with recurrent substernal chest pain, radiating to left shoulder, relieved by NTG. + Nausea. Also is constipated with abdominal discomfort. No fever or cough. C. Cath in 01/2011 by Dr. Rinaldo Cloud with mild to moderate multivessel coronary artery disease with patent stent in LAD, LCX and RCA.  Past Medical History  Diagnosis Date  . GERD (gastroesophageal reflux disease)   . Hypothyroidism   . Renal disorder   . Chronic back pain   . Hx of heart artery stent   . Myocardial infarction   . Stroke   . Arthritis   . Gout   . Pulmonary embolism, bilateral 2010  . Hyperlipemia       Past Surgical History  Procedure Date  . Appendectomy   . Cardiac catheterization   . Coronary angioplasty with stent placement   . Esophagogastroduodenoscopy 12/22/2010    Procedure: ESOPHAGOGASTRODUODENOSCOPY (EGD);  Surgeon: Theda Belfast;  Location: [facility];  Service: Endoscopy;  Laterality: N/A;    Family history: Non-contributory.  Social History:  reports that he has quit smoking. He does not have any smokeless tobacco history on file. He reports that he does not drink alcohol or use illicit drugs.  Allergies: No Known Allergies  Medications Prior to Admission  Medication Sig Dispense Refill  . albuterol (PROVENTIL HFA;VENTOLIN HFA) 108 (90 BASE) MCG/ACT inhaler Inhale 2 puffs into the lungs every 6 (six) hours as needed. For shortness of breath      . amiodarone (PACERONE) 200 MG tablet Take 200 mg by mouth daily.       Marland Kitchen amLODipine (NORVASC) 5 MG tablet Take 5 mg by mouth daily.      Marland Kitchen aspirin 81 MG EC tablet Take 81 mg by mouth daily.      . beclomethasone (QVAR) 80 MCG/ACT inhaler Inhale 1 puff into the lungs 2 (two) times daily as needed. For shortness of breath      . carvedilol (COREG) 3.125 MG tablet Take 3.125 mg by mouth 2 (two) times daily with a meal.      .  dexlansoprazole (DEXILANT) 60 MG capsule Take 60 mg by mouth daily.       . fluticasone (FLONASE) 50 MCG/ACT nasal spray Place 1 spray into the nose at bedtime. For stuffy nose and dryness      . furosemide (LASIX) 40 MG tablet Take 40 mg by mouth daily.      Marland Kitchen glimepiride (AMARYL) 1 MG tablet Take 1 mg by mouth daily before breakfast.       . isosorbide mononitrate (IMDUR) 60 MG 24 hr tablet Take 60 mg by mouth 2 (two) times daily.       Marland Kitchen levothyroxine (SYNTHROID, LEVOTHROID) 50 MCG tablet Take 50 mcg by mouth daily.       . Linaclotide 290 MCG CAPS Take 1 tablet by mouth daily.      . nitroGLYCERIN (NITROSTAT) 0.4 MG SL tablet Place 0.4 mg under the tongue every 5 (five) minutes as needed. For chest pain       . rosuvastatin (CRESTOR) 10 MG tablet Take 10 mg by mouth daily.       . Tamsulosin HCl (FLOMAX) 0.4 MG CAPS Take 0.4 mg by mouth at bedtime.         No results found for this or any previous visit (from the past 48 hour(s)). No results found.  @  ROS@ Constitutional: Negative for fever, chills and weight loss.  HENT: Negative for hearing loss and neck pain.  Eyes: Negative for blurred vision and double vision.  Respiratory: Negative for cough, hemoptysis and sputum production.  Cardiovascular: Positive for chest pain. Negative for palpitations, orthopnea, claudication and leg swelling.  Gastrointestinal: Positive for nausea, vomiting and abdominal pain. Negative for heartburn.  Genitourinary: Negative for dysuria and urgency.  Musculoskeletal: Negative for myalgias.  Skin: Negative for itching and rash.  Neurological: Negative for dizziness, tingling and headaches.   There were no vitals taken for this visit. [name] is an 78 y.o. male.  Chief Complaint: Chest pain/abdominal pain  HPI: Patient is 78 year old male with past medical history significant for multiple medical problems i.e. coronary artery disease history of MI x2 in the past status post PCI to LAD left  circumflex and RCA in the past ischemic cardiomyopathy history of congestive heart failure secondary to systolic dysfunction history of bilateral pulmonary embolism in the past history of paroxysmal atrial fibrillation hypertension non-insulin-dependent diabetes mellitus hypercholesteremia history of DVT remote tobacco abuse chronic kidney disease stage III hypothyroidism history of pancreatitis in the past GERD he came to the ER complaining of the left-sided chest pain radiating to left shoulder and left arm off and on for last few weeks patient describes the chest pain is dull aching pressure grade 5/10 associated with nausea and abdominal pain. Patient was seen in ER at [facility] earlier this morning with vague recurrent abdominal pain had the x-rays and labs which were all negative and was sent home patient had extensive workup for abdominal pain including MRI of the abdominal to evaluate for bowel ischemia and negative patient denies any urinary complaints denies fever or chills denies cough body ache. Patient had multiple ER visits her for similar complaints in the past. Patient denies any palpitation lightheadedness or syncope denies pain the orthopnea leg swelling EKG done in the ER showed normal sinus rhythm with left bundle branch block which was old.  Past Medical History   Diagnosis  Date   .  GERD (gastroesophageal reflux disease)    .  Hypothyroidism    .  Renal disorder    .  Chronic back pain    .  Hx of heart artery stent    .  Myocardial infarction    .  Stroke    .  Arthritis    .  Gout    .  Pulmonary embolism, bilateral  2010   .  Hyperlipemia     Past Surgical History   Procedure  Date   .  Appendectomy    .  Cardiac catheterization    .  Coronary angioplasty with stent placement    .  Esophagogastroduodenoscopy  12/22/2010     Procedure: ESOPHAGOGASTRODUODENOSCOPY (EGD); Surgeon: Theda Belfast; Location: [facility]; Service: Endoscopy; Laterality: N/A;     History reviewed. No pertinent family history.  Social History: reports that he has quit smoking. He does not have any smokeless tobacco history on file. He reports that he does not drink alcohol or use illicit drugs.  Allergies: No Known Allergies  Medications Prior to Admission   Medication  Dose  Route  Frequency  Provider  Last Rate  Last Dose   .  0.9 % sodium chloride infusion   Intravenous  Once  Arman Filter, NP  20 mL/hr at 02/16/11 0248  20 mL/hr at 02/16/11 0248   .  dextrose 50 % solution  50 mL  50 mL  Intravenous  Once  Arman Filter, NP   50 mL at 02/16/11 0357   .  pantoprazole (PROTONIX) injection 40 mg  40 mg  Intravenous  Once  Arman Filter, NP   40 mg at 02/16/11 0248   .  sucralfate (CARAFATE) tablet 1 g  1 g  Oral  Once  Arman Filter, NP   1 g at 02/16/11 0248    Medications Prior to Admission   Medication  Sig  Dispense  Refill   .  amiodarone (PACERONE) 200 MG tablet  Take 200 mg by mouth daily.     .  beclomethasone (QVAR) 80 MCG/ACT inhaler  Inhale 1 puff into the lungs 2 (two) times daily as needed. For shortness of breath     .  carvedilol (COREG) 6.25 MG tablet  Take 6.25 mg by mouth 2 (two) times daily with a meal.     .  clopidogrel (PLAVIX) 75 MG tablet  Take 75 mg by mouth daily.     Marland Kitchen  dexlansoprazole (DEXILANT) 60 MG capsule  Take 60 mg by mouth daily. For reflux     .  fluticasone (FLONASE) 50 MCG/ACT nasal spray  Place 2 sprays into the nose daily as needed. For stuffy nose and dryness     .  furosemide (LASIX) 40 MG tablet  Take 40 mg by mouth daily.     Marland Kitchen  glimepiride (AMARYL) 1 MG tablet  Take 1 mg by mouth daily before breakfast.     .  isosorbide mononitrate (IMDUR) 60 MG 24 hr tablet  Take 60 mg by mouth 2 (two) times daily.     Marland Kitchen  levothyroxine (SYNTHROID, LEVOTHROID) 50 MCG tablet  Take 50 mcg by mouth daily.     .  nitroGLYCERIN (NITROSTAT) 0.4 MG SL tablet  Place 0.4 mg under the tongue every 5 (five) minutes as needed. For chest pain     .   polyethylene glycol (MIRALAX / GLYCOLAX) packet  Take 17 g by mouth daily as needed. For constipation     .  rosuvastatin (CRESTOR) 10 MG tablet  Take 10 mg by mouth daily.     Marland Kitchen  senna-docusate (SENOKOT-S) 8.6-50 MG per tablet  Take 1 tablet by mouth daily as needed. For constipation     .  spironolactone (ALDACTONE) 25 MG tablet  Take 25 mg by mouth daily.     .  Tamsulosin HCl (FLOMAX) 0.4 MG CAPS  Take 0.4 mg by mouth at bedtime.     .  ranitidine (ZANTAC) 300 MG capsule  Take 300 mg by mouth every evening.      Results for orders placed during the hospital encounter of 02/16/11 (from the past 48 hour(s))   CARDIAC PANEL(CRET KIN+CKTOT+MB+TROPI) Status: Normal    Collection Time    02/16/11 4:33 PM   Component  Value  Range  Comment    Total CK  129  7 - 232 (U/L)     CK, MB  2.1  0.3 - 4.0 (ng/mL)     Troponin I  <0.30  <0.30 (ng/mL)     Relative Index  1.6  0.0 - 2.5    CBC Status: Abnormal    Collection Time    02/16/11 4:33 PM   Component  Value  Range  Comment    WBC  6.2  4.0 - 10.5 (K/uL)     RBC  4.36  4.22 - 5.81 (  MIL/uL)     Hemoglobin  12.4 (*)  13.0 - 17.0 (g/dL)     HCT  40.9 (*)  81.1 - 52.0 (%)     MCV  81.7  78.0 - 100.0 (fL)     MCH  28.4  26.0 - 34.0 (pg)     MCHC  34.8  30.0 - 36.0 (g/dL)     RDW  91.4  78.2 - 15.5 (%)     Platelets  161  150 - 400 (K/uL)    BASIC METABOLIC PANEL Status: Abnormal    Collection Time    02/16/11 4:33 PM   Component  Value  Range  Comment    Sodium  141  135 - 145 (mEq/L)     Potassium  4.4  3.5 - 5.1 (mEq/L)     Chloride  109  96 - 112 (mEq/L)     CO2  22  19 - 32 (mEq/L)     Glucose, Bld  78  70 - 99 (mg/dL)     BUN  13  6 - 23 (mg/dL)     Creatinine, Ser  9.56 (*)  0.50 - 1.35 (mg/dL)     Calcium  9.3  8.4 - 10.5 (mg/dL)     GFR calc non Af Amer  33 (*)  >90 (mL/min)     GFR calc Af Amer  38 (*)  >90 (mL/min)    Dg Chest 2 View  02/16/2011 *RADIOLOGY REPORT* Clinical Data: Chest pain for 8 months CHEST - 2 VIEW  Comparison: Chest radiograph 02/14/2011 Findings: Normal mediastinum and cardiac silhouette. Lungs are hyperinflated. No effusion, infiltrate, pneumothorax. IMPRESSION: 1. No acute cardiopulmonary process. 2. Hyperinflated lungs. Original Report Authenticated By: Genevive Bi, M.D.  Review of Systems  Constitutional: Negative for fever, chills and weight loss.  HENT: Negative for hearing loss and neck pain.  Eyes: Negative for blurred vision and double vision.  Respiratory: Negative for cough, hemoptysis and sputum production.  Cardiovascular: Positive for chest pain. Negative for palpitations, orthopnea, claudication and leg swelling.  Gastrointestinal: Positive for nausea, vomiting and abdominal pain. Negative for heartburn.  Genitourinary: Negative for dysuria and urgency.  Musculoskeletal: Negative for myalgias.  Skin: Negative for itching and rash.  Neurological: Negative for dizziness, tingling and headaches.     Physical Exam  Constitutional: He is oriented to person, place, and time. He appears well-developed. Mild distress.  HENT: Head: Normocephalic and atraumatic. Mouth/Throat: No oropharyngeal exudate. Eyes: Brown, conjunctivae are normal. No scleral icterus.  Neck: Normal range of motion. Neck supple. No JVD present. No thyromegaly present.  Cardiovascular: Normal rate and regular rhythm. S1-S2 was normal there was soft systolic murmur, no S3 gallop  Respiratory: Effort normal and breath sounds normal. No respiratory distress. He has no wheezes. He has no rales.  GI: Soft. Bowel sounds are normal. He exhibits no distension and no mass. There is tenderness. There is no rebound and no guarding.  Musculoskeletal: Normal range of motion. He exhibits no edema and no tenderness.  Lymphadenopathy: He has no cervical adenopathy.  Neurological: He is alert and oriented to person, place, and time.    Assessment/Plan Recurrent chest pain rule out progression of CAD.   Abdominal  pain/Constipation.  Coronary artery disease. History of MI x 2 in the past  Ischemic cardiomyopathy  Compensated systolic heart failure  Status post paroxysmal atrial fibrillation  History of bilateral pulmonary embolism  History of DVT  Hypertension  Non-insulin-dependent diabetes mellitus  Hypercholesteremia  Chronic  kidney disease stage III  Anemia  Hypothyroidism   Plan  As per orders  Schedule for nuclear stress test in a.m.  Review of old angiograms   [name] S 07/11/2011, 6:28 PM

## 2011-07-11 NOTE — Progress Notes (Signed)
Pt rating chest pain a 10/10 on the 0-10 scale.  Nitroglycerin administered, without relief of pain. Pt states pain is located in upper abdomen and radiating to left shoulder and arm. MD notified. New orders received.  Will continue to monitor.

## 2011-07-11 NOTE — Progress Notes (Signed)
Patient admitted to unit with 10/10 mid substernal chest pain radiating to left flank and bilateral shoulders.  EKG obtained & MD notified.  New orders received.  Will continue to monitor. Nolon Nations

## 2011-07-12 ENCOUNTER — Inpatient Hospital Stay (HOSPITAL_COMMUNITY): Payer: Medicare Other

## 2011-07-12 ENCOUNTER — Encounter (HOSPITAL_COMMUNITY): Payer: Self-pay | Admitting: General Practice

## 2011-07-12 LAB — CARDIAC PANEL(CRET KIN+CKTOT+MB+TROPI)
CK, MB: 1.9 ng/mL (ref 0.3–4.0)
Relative Index: 1.7 (ref 0.0–2.5)
Troponin I: <0.3 ng/mL (ref <0.30)
Troponin I: <0.3 ng/mL (ref <0.30)

## 2011-07-12 LAB — BASIC METABOLIC PANEL
BUN: 16 mg/dL (ref 6–23)
Chloride: 105 mEq/L (ref 96–112)
GFR calc Af Amer: 50 mL/min — ABNORMAL LOW (ref >90)
Potassium: 3.5 mEq/L (ref 3.5–5.1)
Sodium: 140 mEq/L (ref 135–145)

## 2011-07-12 LAB — CBC
HCT: 35.7 % — ABNORMAL LOW (ref 39.0–52.0)
Hemoglobin: 12.7 g/dL — ABNORMAL LOW (ref 13.0–17.0)
RDW: 15.8 % — ABNORMAL HIGH (ref 11.5–15.5)
WBC: 6.5 10*3/uL (ref 4.0–10.5)

## 2011-07-12 LAB — LIPID PANEL
HDL: 65 mg/dL (ref >39)
Total CHOL/HDL Ratio: 2.8 RATIO
VLDL: 18 mg/dL (ref 0–40)

## 2011-07-12 LAB — PROTIME-INR
INR: 1.04 (ref 0.00–1.49)
Prothrombin Time: 13.8 seconds (ref 11.6–15.2)

## 2011-07-12 LAB — GLUCOSE, CAPILLARY
Glucose-Capillary: 100 mg/dL — ABNORMAL HIGH (ref 70–99)
Glucose-Capillary: 126 mg/dL — ABNORMAL HIGH (ref 70–99)
Glucose-Capillary: 58 mg/dL — ABNORMAL LOW (ref 70–99)
Glucose-Capillary: 126 mg/dL — ABNORMAL HIGH (ref 70–99)

## 2011-07-12 MED ORDER — TECHNETIUM TC 99M TETROFOSMIN IV KIT
30.0000 | PACK | Freq: Once | INTRAVENOUS | Status: AC | PRN
  Administered 2011-07-12: 30 via INTRAVENOUS

## 2011-07-12 MED ORDER — ALUM & MAG HYDROXIDE-SIMETH 200-200-20 MG/5ML PO SUSP
30.0000 mL | Freq: Four times a day (QID) | ORAL | Status: DC | PRN
  Administered 2011-07-12 – 2011-07-14 (×3): 30 mL via ORAL
  Filled 2011-07-12 (×3): qty 30

## 2011-07-12 MED ORDER — GLUCERNA SHAKE PO LIQD: 237.0000 mL | Freq: Every day | ORAL | Status: DC | PRN

## 2011-07-12 MED ORDER — ONDANSETRON HCL 4 MG/2ML IJ SOLN
4.0000 mg | Freq: Once | INTRAMUSCULAR | Status: AC
  Administered 2011-07-12: 4 mg via INTRAVENOUS

## 2011-07-12 MED ORDER — METOCLOPRAMIDE HCL 5 MG PO TABS
5.0000 mg | ORAL_TABLET | Freq: Three times a day (TID) | ORAL | Status: DC
  Administered 2011-07-12 – 2011-07-23 (×42): 5 mg via ORAL
  Filled 2011-07-12 (×55): qty 1

## 2011-07-12 MED ORDER — ONDANSETRON HCL 4 MG/2ML IJ SOLN
INTRAMUSCULAR | Status: AC
  Filled 2011-07-12: qty 2

## 2011-07-12 MED ORDER — TECHNETIUM TC 99M TETROFOSMIN IV KIT
10.0000 | PACK | Freq: Once | INTRAVENOUS | Status: AC | PRN
  Administered 2011-07-12: 10 via INTRAVENOUS

## 2011-07-12 MED ORDER — REGADENOSON 0.4 MG/5ML IV SOLN
INTRAVENOUS | Status: AC
  Administered 2011-07-12: 0.4 mg
  Filled 2011-07-12: qty 5

## 2011-07-12 MED ORDER — PROMETHAZINE HCL 25 MG/ML IJ SOLN
12.5000 mg | Freq: Once | INTRAMUSCULAR | Status: AC
  Administered 2011-07-12: 12.5 mg via INTRAVENOUS
  Filled 2011-07-12: qty 1

## 2011-07-12 NOTE — Progress Notes (Signed)
  Echocardiogram 2D Echocardiogram has been performed.  [name], [facility] 07/12/2011, 11:52 AM

## 2011-07-12 NOTE — Progress Notes (Signed)
Pt c/o burning pain starting under L breast going down to abdomen; pt states that SL NTG did not work last night and requested perocect to be given; EKG obtained, no changes noted; Dr.Kadakia paged and made aware, said ok for pt to still go down for stress test this AM; nuc med made aware

## 2011-07-12 NOTE — Clinical Social Work Psychosocial (Signed)
     Clinical Social Work Department BRIEF PSYCHOSOCIAL ASSESSMENT 07/12/2011  Patient:  [name], [name]     Account Number:  [id number]     Admit date:  07/11/2011  Clinical Social Worker:  Doree Albee  Date/Time:  07/12/2011 04:30 PM  Referred by:  RN  Date Referred:  07/12/2011 Referred for  Advanced Directives  Other - See comment   Other Referral:   Prayer, inapprorrpiate referral, csw informed pt Unit RN who ordered spirtual care.   Interview type:  Patient Other interview type:    PSYCHOSOCIAL DATA Living Status:  ALONE Admitted from facility:   Level of care:   Primary support name:  Lendon Ka Primary support relationship to patient:  FRIEND Degree of support available:   patient assoc. pastor and Rev. Valentina Lucks, pt pastor    CURRENT CONCERNS Current Concerns  Other - See comment   Other Concerns:   advance directive    SOCIAL WORK ASSESSMENT / PLAN CSW met with pt at bedside to discuss advanced directives and role. Pt stated that he was interested, however during the process, patient had a hard time understanding CSW. CSW feels that it may be due to hard of hearing. CSW also had concerns regarding pt support pastors being willing to be pt health care power of attorney. Pt stated he would ask pastors this evening.    CSW and colleague csw plan to follow up with patient tomorrow in hopes of clearing up confusion with louder voices.   Assessment/plan status:  Psychosocial Support/Ongoing Assessment of Needs Other assessment/ plan:   and advanced directives   Information/referral to community resources:   Adanved directives    PATIENTS/FAMILYS RESPONSE TO PLAN OF CARE: Pt thanked csw for help with advanced directives. Pt plans to call pastors and ask if they will be health care power of attorney. CSW will follow up tomorrow.

## 2011-07-12 NOTE — Care Management Note (Addendum)
    Page 1 of 1   07/17/2011     2:37:33 PM   CARE MANAGEMENT NOTE 07/17/2011  Patient:  [facility]   Account Number:  [id number]  Date Initiated:  07/12/2011  Documentation initiated by:  GRAVES-BIGELOW,BRENDA  Subjective/Objective Assessment:   Pt admitted with cp and abdominal pain. Pt is from home alone. He states he has children and they rarely come by to visit. Pt states he still drives and cooks for himself. Pt would benefit from PT/OT consult. CM did suggest to [facility].     Action/Plan:   CM will continue to monitor for additional needs once pt is stable.   Anticipated DC Date:     Anticipated DC Plan:    In-house referral  Clinical Social Worker      DC Planning Services  CM consult      Choice offered to / List presented to:             Status of service:  In process, will continue to follow Medicare Important Message given?   (If response is "NO", the following Medicare IM given date fields will be blank) Date Medicare IM given:   Date Additional Medicare IM given:    Discharge Disposition:    Per UR Regulation:  Reviewed for med. necessity/level of care/duration of stay  If discussed at Long Length of Stay Meetings, dates discussed:    Comments:  6/25 1430p debbie Osmani Kersten rn,bsn spoke w pt and da. pt has 2 sons and 1 da. they are interested in inform on hcpoa and adv directives. sw ref made. await pt's med stability and will ask for phy ther reeval. da comes by 3x/wk for grocery shopping-cleaning and can come more often if needed.  6/24 13:48p debbie Taytem Ghattas rn,bsn [phone] pt was transf to sdu for hypoxia. sw following for poss snf.

## 2011-07-12 NOTE — Progress Notes (Signed)
Subjective:  Chest pain with nuclear stress test without EKG changes of ischemia. No reversible ischemia on Myoview images. 2_D echo with moderate LVH and mild MR  Objective:  Vital Signs in the last 24 hours: Temp:  [97.1 F (36.2 C)-97.4 F (36.3 C)] 97.1 F (36.2 C) (06/20 1400) Pulse Rate:  [62-78] 67  (06/20 1727) Cardiac Rhythm:  [-] Normal sinus rhythm;Sinus bradycardia;Bundle branch block (06/20 0730) Resp:  [18] 18  (06/20 0500) BP: (131-172)/(63-75) 147/71 mmHg (06/20 1727) SpO2:  [96 %-97 %] 96 % (06/20 1400) Weight:  [75.161 kg (165 lb 11.2 oz)] 75.161 kg (165 lb 11.2 oz) (06/20 0500)  Physical Exam: BP Readings from Last 1 Encounters:  07/12/11 147/71    Wt Readings from Last 1 Encounters:  07/12/11 75.161 kg (165 lb 11.2 oz)    Weight change:   HEENT: [place]/AT, Eyes-Brown, PERL, EOMI, Conjunctiva-Pink, Sclera-Non-icteric Neck: No JVD, No bruit, Trachea midline. Lungs:  Clear, Bilateral. Cardiac:  Regular rhythm, normal S1 and S2, no S3.  Abdomen:  Soft, non-tender. Extremities:  No edema present. No cyanosis. No clubbing. CNS: AxOx3, Cranial nerves grossly intact, moves all 4 extremities. Right handed. Skin: Warm and dry.   Intake/Output from previous day: 06/19 0701 - 06/20 0700 In: -  Out: 635 [Urine:635]    Lab Results: BMET    Component Value Date/Time   NA 140 07/12/2011 0520   K 3.5 07/12/2011 0520   CL 105 07/12/2011 0520   CO2 22 07/12/2011 0520   GLUCOSE 97 07/12/2011 0520   BUN 16 07/12/2011 0520   CREATININE 1.49* 07/12/2011 0520   CALCIUM 9.2 07/12/2011 0520   GFRNONAA 43* 07/12/2011 0520   GFRAA 50* 07/12/2011 0520   CBC    Component Value Date/Time   WBC 6.5 07/12/2011 0520   RBC 4.53 07/12/2011 0520   HGB 12.7* 07/12/2011 0520   HCT 35.7* 07/12/2011 0520   PLT 111* 07/12/2011 0520   MCV 78.8 07/12/2011 0520   MCH 28.0 07/12/2011 0520   MCHC 35.6 07/12/2011 0520   RDW 15.8* 07/12/2011 0520   LYMPHSABS 2.4 07/11/2011 1834   MONOABS 0.8 07/11/2011  1834   EOSABS 0.1 07/11/2011 1834   BASOSABS 0.0 07/11/2011 1834   CARDIAC ENZYMES Lab Results  Component Value Date   CKTOTAL 101 07/12/2011   CKMB 1.9 07/12/2011   TROPONINI <0.30 07/12/2011    Assessment/Plan:  Patient Active Hospital Problem List: Chest pain at rest (02/16/2011) No reversible ischemia. EF-60%  DIABETES MELLITUS II, UNCOMPLICATED (03/21/2006) Stable HYPERTENSION, BENIGN SYSTEMIC (03/21/2006) Stable Abdominal pain, ? gastroparesis Start low dose reglan   LOS: 1 day    Orpah Cobb  MD  07/12/2011, 6:57 PM

## 2011-07-12 NOTE — Progress Notes (Signed)
UR Completed Jazon Jipson Graves-Bigelow, RN,BSN [phone]  

## 2011-07-12 NOTE — Progress Notes (Signed)
INITIAL ADULT NUTRITION ASSESSMENT Date: 07/12/2011   Time: 2:56 PM  Reason for Assessment: Nutrition Risk Report  ASSESSMENT: Male 78 y.o.  Dx: Chest pain at rest  Hx:  Past Medical History  Diagnosis Date  . GERD (gastroesophageal reflux disease)   . Hypothyroidism   . Chronic back pain   . Stroke   . Gout   . Pulmonary embolism, bilateral 2010  . Hyperlipemia   . Myocardial infarction 1987; 1989  . Anginal pain   . DVT, lower extremity 1980's    LLE  . Asthma     "touch"  . Pneumonia 1970's  . Diabetes mellitus     "on the borderline"  . Renal disorder ~ 03/2011    "wasn't functioning right"  . Arthritis     "mostly in my knees and shoulders"    Related Meds:     . amiodarone  200 mg Oral Daily  . amLODipine  5 mg Oral Daily  . aspirin  324 mg Oral NOW   Or  . aspirin  300 mg Rectal NOW  . aspirin EC  81 mg Oral Daily  . atorvastatin  20 mg Oral q1800  . carvedilol  3.125 mg Oral BID WC  . fluticasone  1 puff Inhalation BID  . furosemide  40 mg Oral Daily  . glimepiride  1 mg Oral QAC breakfast  . insulin aspart  0-15 Units Subcutaneous TID WC  . isosorbide mononitrate  60 mg Oral BID  . levothyroxine  50 mcg Oral QAC breakfast  . ondansetron      . ondansetron  4 mg Intravenous Once  . pantoprazole  40 mg Oral Q1200  . regadenoson      . sodium chloride  3 mL Intravenous Q12H  . Tamsulosin HCl  0.4 mg Oral QHS  . DISCONTD: aspirin  81 mg Oral Daily    Ht: 5\' 8"  (172.7 cm)  Wt: 165 lb 11.2 oz (75.161 kg)  Ideal Wt: 70 kg % Ideal Wt: 107%  Usual Wt: 185 lb % Usual Wt: 89%  Body mass index is 25.19 kg/(m^2).  Food/Nutrition Related Hx: unintentional weight loss > 10 lbs within the past month & problems chewing or swallowing foods and or liquids per admission nutrition screen  Labs:  CMP     Component Value Date/Time   NA 140 07/12/2011 0520   K 3.5 07/12/2011 0520   CL 105 07/12/2011 0520   CO2 22 07/12/2011 0520   GLUCOSE 97 07/12/2011  0520   BUN 16 07/12/2011 0520   CREATININE 1.49* 07/12/2011 0520   CALCIUM 9.2 07/12/2011 0520   PROT 7.4 07/11/2011 1834   ALBUMIN 3.5 07/11/2011 1834   AST 36 07/11/2011 1834   ALT 30 07/11/2011 1834   ALKPHOS 120* 07/11/2011 1834   BILITOT 0.4 07/11/2011 1834   GFRNONAA 43* 07/12/2011 0520   GFRAA 50* 07/12/2011 0520     Intake/Output Summary (Last 24 hours) at 07/12/11 1457 Last data filed at 07/12/11 0900  Gross per 24 hour  Intake      0 ml  Output    885 ml  Net   -885 ml    CBG (last 3)   Basename 07/12/11 1212 07/11/11 2100 07/11/11 1831  GLUCAP 168* 138* 80    Diet Order: Carb Control  Supplements/Tube Feeding: N/A  IVF: N/A  Estimated Nutritional Needs:   Kcal: 1800-2000 Protein: 85-95 gm Fluid: 1.8-2.9 L  Patient admitted with recurrent substernal chest pain; + nausea  and abdominal pain; reports a 20 lb weight loss; doesn't know in exact time frame or etiology of his weight loss; reports a fair appetite PTA; drinks Ensure supplements sometimes at home; does have some trouble chewing or swallowing, likely due to reflux; no PO intake documented at this time; will order supplement daily PRN  NUTRITION DIAGNOSIS: -Unintentional Weight Loss  RELATED TO: unknown etiology  AS EVIDENCE BY: 11% weight loss  MONITORING/EVALUATION(Goals): Goal: Oral intake with diet & supplements to meet >90% of estimated nutrition needs Monitor: PO intake, weight, labs, I/O's  EDUCATION NEEDS: -No education needs identified at this time  INTERVENTION:  Add Glucerna Shake PO daily PRN (220 kcals, 9.9 gm protein per 8 fl oz can)  RD to follow for nutrition care plan  Dietitian #: [phone]  DOCUMENTATION CODES Per approved criteria  -Not Applicable    Alger Memos 07/12/2011, 2:56 PM

## 2011-07-13 LAB — GLUCOSE, CAPILLARY
Glucose-Capillary: 86 mg/dL (ref 70–99)
Glucose-Capillary: 136 mg/dL — ABNORMAL HIGH (ref 70–99)
Glucose-Capillary: 130 mg/dL — ABNORMAL HIGH (ref 70–99)

## 2011-07-13 MED ORDER — PROMETHAZINE HCL 25 MG PO TABS
25.0000 mg | ORAL_TABLET | Freq: Three times a day (TID) | ORAL | Status: DC
  Administered 2011-07-13 – 2011-07-14 (×6): 25 mg via ORAL
  Filled 2011-07-13 (×9): qty 1

## 2011-07-13 MED ORDER — SUCRALFATE 1 GM/10ML PO SUSP
1.0000 g | Freq: Three times a day (TID) | ORAL | Status: DC
  Administered 2011-07-13 – 2011-07-23 (×40): 1 g via ORAL
  Filled 2011-07-13 (×47): qty 10

## 2011-07-13 NOTE — Progress Notes (Signed)
Inpatient Diabetes Program Recommendations  AACE/ADA: New Consensus Statement on Inpatient Glycemic Control (2009)  Target Ranges:  Prepandial:   less than 140 mg/dL      Peak postprandial:   less than 180 mg/dL (1-2 hours)      Critically ill patients:  140 - 180 mg/dL    Results for RUDOLF, BLIZARD (MRN [id number]) as of 07/13/2011 09:13  Ref. Range 07/12/2011 07:34 07/12/2011 12:12 07/12/2011 16:26 07/12/2011 20:39 07/12/2011 23:06  Glucose-Capillary Latest Range: 70-99 mg/dL 409 (H) 811 (H) 914 (H) 58 (L) 126 (H)   *Hypoglycemic yesterday evening after patient received 2 units Novolog for CBG of 126 mg/dl  Inpatient Diabetes Program Recommendations Correction (SSI): Please decrease Novolog SSI to Sensitive scale (currently ordered as Moderate scale)  Note: Will follow. Ambrose Finland RN, MSN, CDE Diabetes Coordinator Inpatient Diabetes Program [phone]

## 2011-07-13 NOTE — Progress Notes (Signed)
Subjective:  Significant nausea and heart burn. Called Dr. Jeani Hawking, his GI doctor who will see him as out patient and possibly refer to [facility], [place] for further management.  Objective:  Vital Signs in the last 24 hours: Temp:  [97.1 F (36.2 C)-98.2 F (36.8 C)] 98.2 F (36.8 C) (06/21 0500) Pulse Rate:  [56-78] 66  (06/21 0812) Cardiac Rhythm:  [-] Normal sinus rhythm;Sinus bradycardia;Bundle branch block (06/21 0830) Resp:  [16-18] 16  (06/21 0500) BP: (107-172)/(62-75) 123/66 mmHg (06/21 0812) SpO2:  [92 %-96 %] 92 % (06/21 0500) Weight:  [86 kg (189 lb 9.5 oz)] 86 kg (189 lb 9.5 oz) (06/21 0500)  Physical Exam: BP Readings from Last 1 Encounters:  07/13/11 123/66    Wt Readings from Last 1 Encounters:  07/13/11 86 kg (189 lb 9.5 oz)    Weight change: 7.301 kg (16 lb 1.5 oz)  HEENT: [place]/AT, Eyes-Brown, PERL, EOMI, Conjunctiva-Pink, Sclera-Non-icteric Neck: No JVD, No bruit, Trachea midline. Lungs:  Clear, Bilateral. Cardiac:  Regular rhythm, normal S1 and S2, no S3.  Abdomen:  Soft, non-tender. Extremities:  No edema present. No cyanosis. No clubbing. CNS: AxOx3, Cranial nerves grossly intact, moves all 4 extremities. Right handed. Skin: Warm and dry.   Intake/Output from previous day: 06/20 0701 - 06/21 0700 In: -  Out: 900 [Urine:900]    Lab Results: BMET    Component Value Date/Time   NA 140 07/12/2011 0520   K 3.5 07/12/2011 0520   CL 105 07/12/2011 0520   CO2 22 07/12/2011 0520   GLUCOSE 97 07/12/2011 0520   BUN 16 07/12/2011 0520   CREATININE 1.49* 07/12/2011 0520   CALCIUM 9.2 07/12/2011 0520   GFRNONAA 43* 07/12/2011 0520   GFRAA 50* 07/12/2011 0520   CBC    Component Value Date/Time   WBC 6.5 07/12/2011 0520   RBC 4.53 07/12/2011 0520   HGB 12.7* 07/12/2011 0520   HCT 35.7* 07/12/2011 0520   PLT 111* 07/12/2011 0520   MCV 78.8 07/12/2011 0520   MCH 28.0 07/12/2011 0520   MCHC 35.6 07/12/2011 0520   RDW 15.8* 07/12/2011 0520   LYMPHSABS 2.4  07/11/2011 1834   MONOABS 0.8 07/11/2011 1834   EOSABS 0.1 07/11/2011 1834   BASOSABS 0.0 07/11/2011 1834   CARDIAC ENZYMES Lab Results  Component Value Date   CKTOTAL 101 07/12/2011   CKMB 1.9 07/12/2011   TROPONINI <0.30 07/12/2011    Assessment/Plan:  Patient Active Hospital Problem List: Chest pain at rest (02/16/2011)  No reversible ischemia on stress test. EF-60%  DIABETES MELLITUS II, UNCOMPLICATED (03/21/2006) Stable  HYPERTENSION, BENIGN SYSTEMIC (03/21/2006) Stable  Abdominal pain, ? gastroparesis  Reflux esophagitis/Gastritis Carafate and Phenergan. Home soon    LOS: 2 days    Orpah Cobb  MD  07/13/2011, 9:00 AM

## 2011-07-13 NOTE — Progress Notes (Signed)
CSW met with pt and pt daughter to follow up regarding advanced directive. Pt stated that he had not gotten to talk to his pastor yet regarding health care power of attorney. Pt stated that he would take the advanced directive home and follow up with community notary.   Pt may discharge home today, and transportation will be provided by pt assistance pastor.   .No further Clinical Social Work needs, signing off. Marland Kitchen   Catha Gosselin, Theresia Majors  [phone] .07/13/2011 1604pm

## 2011-07-13 NOTE — Evaluation (Signed)
Physical Therapy Evaluation and discharge Patient Details Name: [name] MRN: [id number] DOB: 1929/01/30 Today's Date: 07/13/2011 Time: 2536-6440 PT Time Calculation (min): 19 min  PT Assessment / Plan / Recommendation Clinical Impression  Pt is a 78 y/o male admitted for chest pain. Pt demonstrates independence with all mobility and has no need for further PT services.  Acute PT signing off.     PT Assessment  Patent does not need any further PT services    Follow Up Recommendations  No PT follow up    Barriers to Discharge        lEquipment Recommendations  None recommended by PT    Recommendations for Other Services     Frequency      Precautions / Restrictions Precautions Precautions: None Restrictions Weight Bearing Restrictions: No   Pertinent Vitals/Pain Pt c/o abdominal pain 6/10.  RN notified.          Mobility  Bed Mobility Bed Mobility: Supine to Sit Supine to Sit: 7: Independent;HOB flat Transfers Transfers: Sit to Stand;Stand to Sit;Stand Pivot Transfers Sit to Stand: 7: Independent;From bed;From chair/3-in-1;With upper extremity assist Stand to Sit: 7: Independent;With upper extremity assist Stand Pivot Transfers: 7: Independent Ambulation/Gait Ambulation/Gait Assistance: 7: Independent Assistive device: None Gait Pattern: Within Functional Limits Wheelchair Mobility Wheelchair Mobility: No    Exercises     PT Diagnosis:    PT Problem List:   PT Treatment Interventions:     PT Goals    Visit Information  Last PT Received On: 07/13/11    Subjective Data  Subjective: My stomach hurts Patient Stated Goal: Return to ALF   Prior Functioning  Home Living Lives With: Alone Available Help at Discharge: Family;Friend(s);Available PRN/intermittently (Daught come by once per week.) Type of Home: Assisted living Home Access: Level entry Home Layout: One level Bathroom Shower/Tub: Walk-in Contractor: Handicapped  height Home Adaptive Equipment: Straight cane Prior Function Level of Independence: Independent Driving: Yes Vocation: Retired Musician: Clinical cytogeneticist  Overall Cognitive Status: Appears within functional limits for tasks assessed/performed    Extremity/Trunk Assessment Right Upper Extremity Assessment RUE ROM/Strength/Tone: Within functional levels Left Upper Extremity Assessment LUE ROM/Strength/Tone: Within functional levels Right Lower Extremity Assessment RLE ROM/Strength/Tone: Within functional levels RLE Sensation: WFL - Light Touch Left Lower Extremity Assessment LLE ROM/Strength/Tone: Within functional levels LLE Sensation: WFL - Light Touch;WFL - Proprioception LLE Coordination: WFL - gross motor Trunk Assessment Trunk Assessment: Normal   Balance Balance Balance Assessed: Yes (No LOB in sitting or standing)  End of Session PT - End of Session Activity Tolerance: Patient tolerated treatment well Patient left: in chair;with call bell/phone within reach Nurse Communication: Mobility status   Pancho Rushing 07/13/2011, 4:46 PM  Pooja Camuso L. Mikhai Bienvenue DPT [phone]

## 2011-07-14 LAB — GLUCOSE, CAPILLARY
Glucose-Capillary: 105 mg/dL — ABNORMAL HIGH (ref 70–99)
Glucose-Capillary: 131 mg/dL — ABNORMAL HIGH (ref 70–99)

## 2011-07-14 NOTE — Progress Notes (Signed)
Subjective:  Feeling better. No chest pain. Weak today.  Objective:  Vital Signs in the last 24 hours: Temp:  [97 F (36.1 C)-98.9 F (37.2 C)] 98.9 F (37.2 C) (06/22 0539) Pulse Rate:  [64-84] 81  (06/22 0539) Cardiac Rhythm:  [-] Normal sinus rhythm (06/21 2020) Resp:  [18] 18  (06/22 0539) BP: (97-122)/(60-71) 113/69 mmHg (06/22 0539) SpO2:  [83 %-96 %] 94 % (06/22 0759) Weight:  [78.835 kg (173 lb 12.8 oz)] 78.835 kg (173 lb 12.8 oz) (06/22 0539)  Physical Exam: BP Readings from Last 1 Encounters:  07/14/11 113/69    Wt Readings from Last 1 Encounters:  07/14/11 78.835 kg (173 lb 12.8 oz)    Weight change: -7.165 kg (-15 lb 12.7 oz)  HEENT: [place]/AT, Eyes-Brown, PERL, EOMI, Conjunctiva-Pink, Sclera-Non-icteric Neck: No JVD, No bruit, Trachea midline. Lungs:  Clear, Bilateral. Cardiac:  Regular rhythm, normal S1 and S2, no S3.  Abdomen:  Soft, non-tender. Extremities:  No edema present. No cyanosis. No clubbing. CNS: AxOx3, Cranial nerves grossly intact, moves all 4 extremities. Right handed. Skin: Warm and dry.   Intake/Output from previous day: 06/21 0701 - 06/22 0700 In: -  Out: 300 [Urine:300]    Lab Results: BMET    Component Value Date/Time   NA 140 07/12/2011 0520   K 3.5 07/12/2011 0520   CL 105 07/12/2011 0520   CO2 22 07/12/2011 0520   GLUCOSE 97 07/12/2011 0520   BUN 16 07/12/2011 0520   CREATININE 1.49* 07/12/2011 0520   CALCIUM 9.2 07/12/2011 0520   GFRNONAA 43* 07/12/2011 0520   GFRAA 50* 07/12/2011 0520   CBC    Component Value Date/Time   WBC 6.5 07/12/2011 0520   RBC 4.53 07/12/2011 0520   HGB 12.7* 07/12/2011 0520   HCT 35.7* 07/12/2011 0520   PLT 111* 07/12/2011 0520   MCV 78.8 07/12/2011 0520   MCH 28.0 07/12/2011 0520   MCHC 35.6 07/12/2011 0520   RDW 15.8* 07/12/2011 0520   LYMPHSABS 2.4 07/11/2011 1834   MONOABS 0.8 07/11/2011 1834   EOSABS 0.1 07/11/2011 1834   BASOSABS 0.0 07/11/2011 1834   CARDIAC ENZYMES Lab Results  Component Value Date     CKTOTAL 101 07/12/2011   CKMB 1.9 07/12/2011   TROPONINI <0.30 07/12/2011    Assessment/Plan:  Patient Active Hospital Problem List: Chest pain at rest (02/16/2011)  No reversible ischemia on stress test. EF-60%  DIABETES MELLITUS II, UNCOMPLICATED (03/21/2006) Stable  HYPERTENSION, BENIGN SYSTEMIC (03/21/2006) Stable  Abdominal pain, ? gastroparesis  Reflux esophagitis/Gastritis  Carafate and Phenergan.  Increase activity to discharge home in AM      LOS: 3 days    Orpah Cobb  MD  07/14/2011, 8:37 AM

## 2011-07-15 ENCOUNTER — Inpatient Hospital Stay (HOSPITAL_COMMUNITY): Payer: Medicare Other

## 2011-07-15 LAB — GLUCOSE, CAPILLARY
Glucose-Capillary: 118 mg/dL — ABNORMAL HIGH (ref 70–99)
Glucose-Capillary: 164 mg/dL — ABNORMAL HIGH (ref 70–99)
Glucose-Capillary: 94 mg/dL (ref 70–99)

## 2011-07-15 MED ORDER — SUCRALFATE 1 GM/10ML PO SUSP: 1.0000 g | Freq: Three times a day (TID) | ORAL | Status: AC

## 2011-07-15 MED ORDER — ALPRAZOLAM 0.25 MG PO TABS: 0.2500 mg | ORAL_TABLET | Freq: Every evening | ORAL | Status: AC | PRN

## 2011-07-15 MED ORDER — ROSUVASTATIN CALCIUM 10 MG PO TABS: 10.0000 mg | ORAL_TABLET | Freq: Every day | ORAL | Status: DC

## 2011-07-15 MED ORDER — ALBUTEROL SULFATE (5 MG/ML) 0.5% IN NEBU
2.5000 mg | INHALATION_SOLUTION | Freq: Once | RESPIRATORY_TRACT | Status: AC
  Administered 2011-07-15: 2.5 mg via RESPIRATORY_TRACT
  Filled 2011-07-15: qty 0.5

## 2011-07-15 NOTE — Discharge Summary (Signed)
Physician Discharge Summary  Patient ID: [name] MRN: [id number] DOB/AGE: 1929-10-21 78 y.o.  Admit date: 07/11/2011 Discharge date: 07/15/2011  Admission Diagnoses: Chest pain at rest DIABETES MELLITUS II, UNCOMPLICATED HYPERTENSION, BENIGN SYSTEMIC  Discharge Diagnoses:  Principal Problem:  *Chest pain at rest Active Problems:  DIABETES MELLITUS II, UNCOMPLICATED  HYPERTENSION, BENIGN SYSTEMIC  REFLUX ESOPHAGITIS CHRONIC GASTRITIS  Discharged Condition: good  Hospital Course: 77 years old black male presented with recurrent substernal chest pain radiating to left shoulder. He had patent stents in LAD, LCX and RCA 5 months ago. He underwent nuclear stress test that did not show reversible ischemia. Post stress test he developed nausea and weakness + abdominal pain. He responded to Carafat 1 gm. PO tid. He was discharged home in satisfactory condition on 07/15/2011 with follow up in 2 weeks by Dr. Sharyn Lull.   Consults: None  Significant Diagnostic Studies: labs: Normal CBC, BMET and cardiac enzymes.   Nuclear medicine: 1. No areas of reversible ischemia. 2. 60% left ventricular ejection fraction. 3. Stable anterior apical scar. Septal hypokinesia again noted.   Treatments: cardiac meds: carvedilol, amiodarone and Plavix. Carafate 1 gm  Discharge Exam: Blood pressure 117/53, pulse 79, temperature 99.7 F (37.6 C), temperature source Oral, resp. rate 18, height 5\' 8"  (1.727 m), weight 79.833 kg (176 lb), SpO2 84.00%. Constitutional: He appears well-developed. Mild distress.  HENT: Head: Normocephalic and atraumatic. Mouth/Throat: No oropharyngeal exudate. Eyes: Brown, conjunctivae are normal. No scleral icterus.  Neck: Normal range of motion. Neck supple. No JVD present. No thyromegaly present.  Cardiovascular: Normal rate and regular rhythm. S1-S2 was normal there was soft systolic murmur, no S3 gallop  Respiratory: Effort normal and breath sounds normal. No respiratory  distress. He has no wheezes. He has no rales.  GI: Soft. Bowel sounds are normal. He exhibits no distension and no mass. There is tenderness. There is no rebound and no guarding.  Musculoskeletal: Normal range of motion. He exhibits no edema and no tenderness.  Lymphadenopathy: He has no cervical adenopathy.  Neurological: He is alert and oriented to person, place, and time.      Disposition: 01-Home or Self Care  Discharge Orders    Future Appointments: Provider: Department: Dept Phone: Center:   08/08/2011 9:30 AM Etta Grandchild, MD [facility] [phone] [facility]     Medication List  As of 07/15/2011 11:22 AM   ASK your doctor about these medications         albuterol 108 (90 BASE) MCG/ACT inhaler   Commonly known as: PROVENTIL HFA;VENTOLIN HFA   Inhale 2 puffs into the lungs every 6 (six) hours as needed. For shortness of breath      amiodarone 200 MG tablet   Commonly known as: PACERONE   Take 200 mg by mouth daily.      amLODipine 5 MG tablet   Commonly known as: NORVASC   Take 5 mg by mouth daily.      aspirin 81 MG EC tablet   Take 81 mg by mouth daily.      carvedilol 3.125 MG tablet   Commonly known as: COREG   Take 3.125 mg by mouth 2 (two) times daily with a meal.      DEXILANT 60 MG capsule   Generic drug: dexlansoprazole   Take 60 mg by mouth daily.      furosemide 40 MG tablet   Commonly known as: LASIX   Take 40 mg by mouth daily.      glimepiride 1 MG  tablet   Commonly known as: AMARYL   Take 1 mg by mouth daily before breakfast.      isosorbide mononitrate 60 MG 24 hr tablet   Commonly known as: IMDUR   Take 60 mg by mouth 2 (two) times daily.      levothyroxine 50 MCG tablet   Commonly known as: SYNTHROID, LEVOTHROID   Take 50 mcg by mouth daily.      lubiprostone 24 MCG capsule   Commonly known as: AMITIZA   Take 24 mcg by mouth 2 (two) times daily with a meal.      nitroGLYCERIN 0.4 MG SL tablet   Commonly known as: NITROSTAT   Place 0.4  mg under the tongue every 5 (five) minutes as needed. For chest pain      QNASL 80 MCG/ACT Aers   Generic drug: Beclomethasone Dipropionate   Place 1 puff into the nose 2 (two) times daily.      ranitidine 300 MG capsule   Commonly known as: ZANTAC   Take 300 mg by mouth every evening.      Tamsulosin HCl 0.4 MG Caps   Commonly known as: FLOMAX   Take 0.4 mg by mouth at bedtime.             SignedOrpah Cobb S 07/15/2011, 11:22 AM

## 2011-07-15 NOTE — Progress Notes (Addendum)
Pt. Reports he is working on contacting someone to come and get him for discharge to home.  No progress being made RN TC to brother who is unaware of pt. Discharge.  Upon brother's arrival, pt refuses to get dressed and is suddenly unable to stand and move around. Although pt. Has been up in chair 3 hrs today and has walked to and from BR with assist of cane only. After much discussion with pt. MD nurse and brother, pt agrees to rehab facility. Social work cx placed will pursue placement at this time.  1800- brother has now provided telephone number for daughter who I am told we should communicate to as lead liason for pt., she will be here at 8am to speak with the Child psychotherapist. 75- Niece is now here who fixes pt. Med box each week. Please note pt does not have POA Niece is familiar with pt. And reports speech is more slurred than baseline and is concerned that pt. May have had a stroke. Pt. Has received no phenergan this shift and seems more alert and speech improved from earlier today will continue to monitor and all info passed on in report

## 2011-07-15 NOTE — Progress Notes (Signed)
Phenergan held, pt very sedated sao2 84% placed on 2L o2 by RT at 9am Sao2 rechecked with med pass, 97%

## 2011-07-16 ENCOUNTER — Inpatient Hospital Stay (HOSPITAL_COMMUNITY): Payer: Medicare Other

## 2011-07-16 LAB — BASIC METABOLIC PANEL
BUN: 24 mg/dL — ABNORMAL HIGH (ref 6–23)
CO2: 27 mEq/L (ref 19–32)
Chloride: 104 mEq/L (ref 96–112)
Creatinine, Ser: 1.67 mg/dL — ABNORMAL HIGH (ref 0.50–1.35)
GFR calc Af Amer: 43 mL/min — ABNORMAL LOW (ref >90)
Glucose, Bld: 118 mg/dL — ABNORMAL HIGH (ref 70–99)
Potassium: 3.7 mEq/L (ref 3.5–5.1)

## 2011-07-16 LAB — CBC
HCT: 39.7 % (ref 39.0–52.0)
Hemoglobin: 14.1 g/dL (ref 13.0–17.0)
MCHC: 35.4 g/dL (ref 30.0–36.0)
MCV: 79.1 fL (ref 78.0–100.0)
Platelets: 115 10*3/uL — ABNORMAL LOW (ref 150–400)
RDW: 15.9 % — ABNORMAL HIGH (ref 11.5–15.5)
RDW: 15.8 % — ABNORMAL HIGH (ref 11.5–15.5)
WBC: 10.6 10*3/uL — ABNORMAL HIGH (ref 4.0–10.5)
WBC: 12.1 10*3/uL — ABNORMAL HIGH (ref 4.0–10.5)

## 2011-07-16 LAB — CARDIAC PANEL(CRET KIN+CKTOT+MB+TROPI)
Relative Index: 4.4 — ABNORMAL HIGH (ref 0.0–2.5)
Relative Index: 2.4 (ref 0.0–2.5)
Total CK: 140 U/L (ref 7–232)
Total CK: 119 U/L (ref 7–232)
Troponin I: 1.31 ng/mL (ref <0.30)

## 2011-07-16 LAB — HEPARIN LEVEL (UNFRACTIONATED)
Heparin Unfractionated: 0.73 IU/mL — ABNORMAL HIGH (ref 0.30–0.70)
Heparin Unfractionated: 0.69 IU/mL (ref 0.30–0.70)

## 2011-07-16 LAB — GLUCOSE, CAPILLARY: Glucose-Capillary: 234 mg/dL — ABNORMAL HIGH (ref 70–99)

## 2011-07-16 LAB — D-DIMER, QUANTITATIVE: D-Dimer, Quant: 13.56 ug/mL-FEU — ABNORMAL HIGH (ref 0.00–0.48)

## 2011-07-16 LAB — MRSA PCR SCREENING: MRSA by PCR: NEGATIVE

## 2011-07-16 MED ORDER — NITROGLYCERIN IN D5W 200-5 MCG/ML-% IV SOLN
5.0000 ug/min | INTRAVENOUS | Status: DC
  Administered 2011-07-16: 5 ug/min via INTRAVENOUS
  Filled 2011-07-16 (×2): qty 250

## 2011-07-16 MED ORDER — HEPARIN BOLUS VIA INFUSION
4000.0000 [IU] | Freq: Once | INTRAVENOUS | Status: AC
  Administered 2011-07-16: 4000 [IU] via INTRAVENOUS
  Filled 2011-07-16: qty 4000

## 2011-07-16 MED ORDER — WARFARIN SODIUM 7.5 MG PO TABS
7.5000 mg | ORAL_TABLET | Freq: Once | ORAL | Status: AC
  Administered 2011-07-16: 7.5 mg via ORAL
  Filled 2011-07-16: qty 1

## 2011-07-16 MED ORDER — COUMADIN BOOK
Freq: Once | Status: AC
Start: 2011-07-16 — End: 2011-07-16
  Administered 2011-07-16: 21:00:00
  Filled 2011-07-16: qty 1

## 2011-07-16 MED ORDER — TECHNETIUM TO 99M ALBUMIN AGGREGATED
3.0000 | Freq: Once | INTRAVENOUS | Status: AC | PRN
  Administered 2011-07-16: 3 via INTRAVENOUS

## 2011-07-16 MED ORDER — WARFARIN VIDEO
Freq: Once | Status: AC
  Administered 2011-07-17: 12:00:00

## 2011-07-16 MED ORDER — HEPARIN (PORCINE) IN NACL 100-0.45 UNIT/ML-% IJ SOLN
1050.0000 [IU]/h | INTRAMUSCULAR | Status: DC
  Administered 2011-07-16: 1100 [IU]/h via INTRAVENOUS
  Administered 2011-07-16 – 2011-07-17 (×2): 950 [IU]/h via INTRAVENOUS
  Administered 2011-07-19: 1050 [IU]/h via INTRAVENOUS
  Filled 2011-07-16 (×7): qty 250

## 2011-07-16 MED ORDER — WARFARIN - PHARMACIST DOSING INPATIENT
Freq: Every day | Status: DC
  Administered 2011-07-18 – 2011-07-20 (×3)

## 2011-07-16 NOTE — Progress Notes (Signed)
Subjective:  Low oxygen saturation without chest pain, shortness of breath. H/O PE in past. Off heparin.  Objective:  Vital Signs in the last 24 hours: Temp:  [99 F (37.2 C)-99.7 F (37.6 C)] 99 F (37.2 C) (06/23 2130) Pulse Rate:  [79-90] 90  (06/23 2130) Cardiac Rhythm:  [-] Normal sinus rhythm (06/23 0929) Resp:  [18] 18  (06/23 2130) BP: (117-131)/(53-71) 131/64 mmHg (06/23 2130) SpO2:  [72 %-96 %] 86 % (06/23 2227) FiO2 (%):  [50 %] 50 % (06/23 2227) Weight:  [79.833 kg (176 lb)] 79.833 kg (176 lb) (06/23 0500)  Physical Exam: BP Readings from Last 1 Encounters:  07/15/11 131/64    Wt Readings from Last 1 Encounters:  07/15/11 79.833 kg (176 lb)    Weight change:   HEENT: [place]/AT, Eyes-Brown, PERL, EOMI, Conjunctiva-Pink, Sclera-Non-icteric Neck: No JVD, No bruit, Trachea midline. Lungs:  Clear, Bilateral. Cardiac:  Regular rhythm, normal S1 and S2, no S3.  Abdomen:  Soft, non-tender. Extremities:  No edema present. No cyanosis. No clubbing. CNS: AxOx3, Cranial nerves grossly intact, moves all 4 extremities. Right handed. Skin: Warm and dry.   Intake/Output from previous day: 06/23 0701 - 06/24 0700 In: 723 [P.O.:720; I.V.:3] Out: 600 [Urine:600]    Lab Results: BMET    Component Value Date/Time   NA 140 07/12/2011 0520   K 3.5 07/12/2011 0520   CL 105 07/12/2011 0520   CO2 22 07/12/2011 0520   GLUCOSE 97 07/12/2011 0520   BUN 16 07/12/2011 0520   CREATININE 1.49* 07/12/2011 0520   CALCIUM 9.2 07/12/2011 0520   GFRNONAA 43* 07/12/2011 0520   GFRAA 50* 07/12/2011 0520   CBC    Component Value Date/Time   WBC 6.5 07/12/2011 0520   RBC 4.53 07/12/2011 0520   HGB 12.7* 07/12/2011 0520   HCT 35.7* 07/12/2011 0520   PLT 111* 07/12/2011 0520   MCV 78.8 07/12/2011 0520   MCH 28.0 07/12/2011 0520   MCHC 35.6 07/12/2011 0520   RDW 15.8* 07/12/2011 0520   LYMPHSABS 2.4 07/11/2011 1834   MONOABS 0.8 07/11/2011 1834   EOSABS 0.1 07/11/2011 1834   BASOSABS 0.0 07/11/2011 1834    CARDIAC ENZYMES Lab Results  Component Value Date   CKTOTAL 101 07/12/2011   CKMB 1.9 07/12/2011   TROPONINI <0.30 07/12/2011    Assessment/Plan:  Patient Active Hospital Problem List: Hypoxemia Possible PE Resume heparin VQ scan in AM + D-dimer in AM Supplemental Oxygen DIABETES MELLITUS II, UNCOMPLICATED (03/21/2006) HYPERTENSION, BENIGN SYSTEMIC (03/21/2006)     LOS: 5 days    Orpah Cobb  MD  07/16/2011, 12:31 AM

## 2011-07-16 NOTE — Progress Notes (Signed)
Pt began to complain of chest pain and not feeling very well. BP 120/83, hr 90s NSr, resp rate 24, o2 sats continuing to be in the upper 80's on venti mask at 50%. Dr. [name] made aware and orders received. Dr. Sharyn Lull to make rounds. Will closely monitor

## 2011-07-16 NOTE — Progress Notes (Signed)
07/16/11 0025  Dr. [name] at patient bedside.  [name]

## 2011-07-16 NOTE — Progress Notes (Signed)
Clinical Social Worker reviewed chart and saw that PT evaluated patient on 07/13/11 and did not recommend any follow up. May need to have PT re-evaluate patient for disposition needs. CSW will continue to follow and await PT recommendation based on new evaluation.   Rozetta Nunnery MSW, [employer] [phone]

## 2011-07-16 NOTE — Progress Notes (Signed)
Pt with complaints of midsternal chest pain and left arm pain. He also complained of right groin pain from his cath site a month ago. Per pt and daughter at bedside, he has been complaining of that since it was completed on his last cath. I do not see any areas of concern in his right groin which is where he said he had it done. Groin is soft. Will notify Dr. [name]. Pt being transferred to 2900 for stepdown level of care. Pt transported with monitor, o2 and rapid response RN. Family at bedside

## 2011-07-16 NOTE — Progress Notes (Signed)
07/15/11 2215  Attempted to wean patient to 6L/[place].  Unable to maintain sats above 86%.  Placed back on 50% venti-mask.  O2 sat maintained at 85-88%.  Dr. [name] notified of decreased O2 sat.  Orders received and implemented.  Alonza Bogus

## 2011-07-16 NOTE — Progress Notes (Signed)
07/15/11 2330  Dr. [name] notified of CXR results and updated on pt's respiratory status.  Patient continues on 50% venti-mask with sats 85-88%.  Alert and oriented, denies pain, SOB, or difficulty breathing.  Lethargic but arouses and holds conversation with staff.  Dr. [name] to come assess patient.  [name]

## 2011-07-16 NOTE — Significant Event (Signed)
Rapid Response Event Note  Overview: Time Called: 0945 Arrival Time: 0950 Event Type: Respiratory;Cardiac  Initial Focused Assessment/Interventions: Called to patient's bedside because pt C/O CP and O2 sats 85-88% on 50% venti. Awaiting V/Q scan Upon arrival repositioned patient, patient became SOB using accessory muscles.  RR 32 O2 sat 88-90% on venti. Increased O2 to 100%NRB, O2 sat 96% Patient sleepy, but easy to arouse and conversant. Lung sound clear, crackles Left base.  Heart tones reg.  Desats with any activity, rolling side to side in bed O2 sat decreased to 83% on NRB. O2 sat recover to 98% Daughter at bedside Transferred to 2919 via bed with O2 and heart monitor. RN at bedside    Event Summary: Name of Physician Notified: Dr Sharyn Lull at [phone]    at    Outcome: Transferred (Comment)  Event End Time: 1038  Marcellina Millin

## 2011-07-16 NOTE — Progress Notes (Signed)
ANTICOAGULATION CONSULT NOTE - Initial Consult  Pharmacy Consult for heparin Indication: r/o PE  No Known Allergies  Patient Measurements: Height: 5\' 8"  (172.7 cm) Weight: 176 lb (79.833 kg) IBW/kg (Calculated) : 68.4   Vital Signs: Temp: 99 F (37.2 C) (06/23 2130) BP: 131/64 mmHg (06/23 2130) Pulse Rate: 90  (06/23 2130)  Labs: No results found for this basename: HGB:2,HCT:3,PLT:3,APTT:3,LABPROT:3,INR:3,HEPARINUNFRC:3,CREATININE:3,CKTOTAL:3,CKMB:3,TROPONINI:3 in the last 72 hours  Estimated Creatinine Clearance: 38.9 ml/min (by C-G formula based on Cr of 1.49).   Medical History: Past Medical History  Diagnosis Date  . GERD (gastroesophageal reflux disease)   . Hypothyroidism   . Chronic back pain   . Stroke   . Gout   . Pulmonary embolism, bilateral 2010  . Hyperlipemia   . Myocardial infarction 1987; 1989  . Anginal pain   . DVT, lower extremity 1980's    LLE  . Asthma     "touch"  . Pneumonia 1970's  . Diabetes mellitus     "on the borderline"  . Renal disorder ~ 03/2011    "wasn't functioning right"  . Arthritis     "mostly in my knees and shoulders"    Medications:  Prescriptions prior to admission  Medication Sig Dispense Refill  . albuterol (PROVENTIL HFA;VENTOLIN HFA) 108 (90 BASE) MCG/ACT inhaler Inhale 2 puffs into the lungs every 6 (six) hours as needed. For shortness of breath      . amiodarone (PACERONE) 200 MG tablet Take 200 mg by mouth daily.       Marland Kitchen amLODipine (NORVASC) 5 MG tablet Take 5 mg by mouth daily.      Marland Kitchen aspirin 81 MG EC tablet Take 81 mg by mouth daily.      . Beclomethasone Dipropionate (QNASL) 80 MCG/ACT AERS Place 1 puff into the nose 2 (two) times daily.      . carvedilol (COREG) 3.125 MG tablet Take 3.125 mg by mouth 2 (two) times daily with a meal.      . dexlansoprazole (DEXILANT) 60 MG capsule Take 60 mg by mouth daily.       . furosemide (LASIX) 40 MG tablet Take 40 mg by mouth daily.      Marland Kitchen glimepiride (AMARYL) 1 MG  tablet Take 1 mg by mouth daily before breakfast.       . isosorbide mononitrate (IMDUR) 60 MG 24 hr tablet Take 60 mg by mouth 2 (two) times daily.       Marland Kitchen levothyroxine (SYNTHROID, LEVOTHROID) 50 MCG tablet Take 50 mcg by mouth daily.       Marland Kitchen lubiprostone (AMITIZA) 24 MCG capsule Take 24 mcg by mouth 2 (two) times daily with a meal.      . nitroGLYCERIN (NITROSTAT) 0.4 MG SL tablet Place 0.4 mg under the tongue every 5 (five) minutes as needed. For chest pain       . ranitidine (ZANTAC) 300 MG capsule Take 300 mg by mouth every evening.      . Tamsulosin HCl (FLOMAX) 0.4 MG CAPS Take 0.4 mg by mouth at bedtime.        Scheduled:    . albuterol  2.5 mg Nebulization Once  . amiodarone  200 mg Oral Daily  . amLODipine  5 mg Oral Daily  . aspirin EC  81 mg Oral Daily  . atorvastatin  20 mg Oral q1800  . carvedilol  3.125 mg Oral BID WC  . fluticasone  1 puff Inhalation BID  . furosemide  40 mg Oral Daily  .  glimepiride  1 mg Oral QAC breakfast  . heparin  4,000 Units Intravenous Once  . insulin aspart  0-15 Units Subcutaneous TID WC  . isosorbide mononitrate  60 mg Oral BID  . levothyroxine  50 mcg Oral QAC breakfast  . metoCLOPramide  5 mg Oral TID AC & HS  . pantoprazole  40 mg Oral Q1200  . sodium chloride  3 mL Intravenous Q12H  . sucralfate  1 g Oral TID WC & HS  . Tamsulosin HCl  0.4 mg Oral QHS  . DISCONTD: promethazine  25 mg Oral TID    Assessment: 78yo male with h/o PE, not currently anticoagulated, concern for new PE given hypoxemia, awaiting D-dimer and VQ, to begin heparin for r/o PE.  Goal of Therapy:  Heparin level 0.3-0.7 units/ml Monitor platelets by anticoagulation protocol: Yes   Plan:  Will give heparin 4000 units IV bolus x1 followed by gtt at 1100 units/hr and monitor heparin levels and CBC.  Colleen Can PharmD BCPS 07/16/2011,12:47 AM

## 2011-07-16 NOTE — Progress Notes (Addendum)
07/15/11 2130  RT at bedside giving inhaler.  O2 sats in the 70s on room air.  NAD noted.  Patient alert and denies pain or SOB.  Patient placed on 50% venti-mask per RT.  Sats improved to 92%.  Will continue to monitor.  Alonza Bogus

## 2011-07-16 NOTE — Progress Notes (Signed)
Subjective:  Patient complains of mild shortness of breath denies any chest pain at present. Her perfusion scan showed possible perfusion defect ventilation scan is not done patient had a significantly elevated d-dimer. Patient also had a mildly elevated troponin I. her but with no significant elevation of CPK MB and the total CK. Patient also had left cardiac cath in January of 2013 which showed no critical stenosis.  Objective:  Vital Signs in the last 24 hours: Temp:  [97.2 F (36.2 C)-99.4 F (37.4 C)] 97.2 F (36.2 C) (06/24 1600) Pulse Rate:  [87-98] 93  (06/24 1600) Resp:  [17-33] 30  (06/24 1400) BP: (111-159)/(55-105) 146/73 mmHg (06/24 1600) SpO2:  [72 %-100 %] 92 % (06/24 1400) FiO2 (%):  [50 %-100 %] 50 % (06/24 1600)  Intake/Output from previous day: 06/23 0701 - 06/24 0700 In: 723 [P.O.:720; I.V.:3] Out: 600 [Urine:600] Intake/Output from this shift:    Physical Exam: Neck: no adenopathy, no carotid bruit, no JVD and supple, symmetrical, trachea midline Lungs: Decreased breath sound at bases Heart: regular rate and rhythm, S1, S2 normal and Soft systolic murmur noted Abdomen: soft, non-tender; bowel sounds normal; no masses,  no organomegaly Extremities: extremities normal, atraumatic, no cyanosis or edema  Lab Results:  Basename 07/16/11 1427 07/16/11 1029  WBC 12.1* 10.6*  HGB 14.1 14.5  PLT 128* 115*    Basename 07/16/11 1427  NA 144  K 3.7  CL 104  CO2 27  GLUCOSE 118*  BUN 24*  CREATININE 1.67*    Basename 07/16/11 1801 07/16/11 1026  TROPONINI 1.31* 2.04*   Hepatic Function Panel No results found for this basename: PROT,ALBUMIN,AST,ALT,ALKPHOS,BILITOT,BILIDIR,IBILI in the last 72 hours No results found for this basename: CHOL in the last 72 hours No results found for this basename: PROTIME in the last 72 hours  Imaging: Imaging results have been reviewed and Dg Chest Port 1 View  07/15/2011  *RADIOLOGY REPORT*  Clinical Data: 78 year old  male with shortness of breath, oxygen desaturation.  PORTABLE CHEST - 1 VIEW  Comparison: 07/12/2011 and earlier.  Findings: Portable semi upright AP view 2238 hours.  Lower lung volumes.  New left basilar and right perihilar opacity most resembles atelectasis.  No pneumothorax or pulmonary edema.  No definite effusion. Visualized tracheal air column is within normal limits.  IMPRESSION: Lower lung volumes with increased left basilar right perihilar opacity, but most likely atelectasis.  No other acute cardiopulmonary abnormality.  Original Report Authenticated By: Harley Hallmark, M.D.    Cardiac Studies:  Assessment/Plan:  Acute pulmonary embolism Possible small non-Q-wave myocardial infarction Coronary artery disease Hypertension Diabetes matters History of bilateral PE in the past Chronic kidney disease stage III Anemia GERD Chronic abdominal pain History of atrial fibrillation in the past Plan Continue IV heparin Restart Coumadin per pharmacy protocol   LOS: 5 days    Klark Vanderhoef N 07/16/2011, 7:58 PM

## 2011-07-16 NOTE — Progress Notes (Signed)
ANTICOAGULATION CONSULT NOTE - Initial Consult  Pharmacy Consult for heparin Indication: r/o PE  No Known Allergies  Patient Measurements: Height: 5\' 8"  (172.7 cm) Weight: 176 lb (79.833 kg) IBW/kg (Calculated) : 68.4   Vital Signs: Temp: 99.4 F (37.4 C) (06/24 1131) Temp src: Oral (06/24 1131) BP: 145/83 mmHg (06/24 1133) Pulse Rate: 96  (06/24 1131)  Labs:  Basename 07/16/11 1029  HGB 14.5  HCT 41.0  PLT 115*  APTT --  LABPROT --  INR --  HEPARINUNFRC 0.73*  CREATININE --  CKTOTAL --  CKMB --  TROPONINI --    Estimated Creatinine Clearance: 38.9 ml/min (by C-G formula based on Cr of 1.49).   Medical History: Past Medical History  Diagnosis Date  . GERD (gastroesophageal reflux disease)   . Hypothyroidism   . Chronic back pain   . Stroke   . Gout   . Pulmonary embolism, bilateral 2010  . Hyperlipemia   . Myocardial infarction 1987; 1989  . Anginal pain   . DVT, lower extremity 1980's    LLE  . Asthma     "touch"  . Pneumonia 1970's  . Diabetes mellitus     "on the borderline"  . Renal disorder ~ 03/2011    "wasn't functioning right"  . Arthritis     "mostly in my knees and shoulders"    Medications:  Prescriptions prior to admission  Medication Sig Dispense Refill  . albuterol (PROVENTIL HFA;VENTOLIN HFA) 108 (90 BASE) MCG/ACT inhaler Inhale 2 puffs into the lungs every 6 (six) hours as needed. For shortness of breath      . amiodarone (PACERONE) 200 MG tablet Take 200 mg by mouth daily.       Marland Kitchen amLODipine (NORVASC) 5 MG tablet Take 5 mg by mouth daily.      Marland Kitchen aspirin 81 MG EC tablet Take 81 mg by mouth daily.      . Beclomethasone Dipropionate (QNASL) 80 MCG/ACT AERS Place 1 puff into the nose 2 (two) times daily.      . carvedilol (COREG) 3.125 MG tablet Take 3.125 mg by mouth 2 (two) times daily with a meal.      . dexlansoprazole (DEXILANT) 60 MG capsule Take 60 mg by mouth daily.       . furosemide (LASIX) 40 MG tablet Take 40 mg by mouth  daily.      Marland Kitchen glimepiride (AMARYL) 1 MG tablet Take 1 mg by mouth daily before breakfast.       . isosorbide mononitrate (IMDUR) 60 MG 24 hr tablet Take 60 mg by mouth 2 (two) times daily.       Marland Kitchen levothyroxine (SYNTHROID, LEVOTHROID) 50 MCG tablet Take 50 mcg by mouth daily.       Marland Kitchen lubiprostone (AMITIZA) 24 MCG capsule Take 24 mcg by mouth 2 (two) times daily with a meal.      . nitroGLYCERIN (NITROSTAT) 0.4 MG SL tablet Place 0.4 mg under the tongue every 5 (five) minutes as needed. For chest pain       . ranitidine (ZANTAC) 300 MG capsule Take 300 mg by mouth every evening.      . Tamsulosin HCl (FLOMAX) 0.4 MG CAPS Take 0.4 mg by mouth at bedtime.        Scheduled:     . albuterol  2.5 mg Nebulization Once  . amiodarone  200 mg Oral Daily  . amLODipine  5 mg Oral Daily  . aspirin EC  81 mg Oral Daily  .  atorvastatin  20 mg Oral q1800  . carvedilol  3.125 mg Oral BID WC  . fluticasone  1 puff Inhalation BID  . furosemide  40 mg Oral Daily  . glimepiride  1 mg Oral QAC breakfast  . heparin  4,000 Units Intravenous Once  . insulin aspart  0-15 Units Subcutaneous TID WC  . isosorbide mononitrate  60 mg Oral BID  . levothyroxine  50 mcg Oral QAC breakfast  . metoCLOPramide  5 mg Oral TID AC & HS  . pantoprazole  40 mg Oral Q1200  . sodium chloride  3 mL Intravenous Q12H  . sucralfate  1 g Oral TID WC & HS  . Tamsulosin HCl  0.4 mg Oral QHS  . DISCONTD: promethazine  25 mg Oral TID    Assessment: 78yo male with h/o PE, not currently anticoagulated, concern for new PE given hypoxemia, and positive D-dimer, started on heparin this am. Xa level is slightly supratherapeutic, will decrease rate.  Goal of Therapy:  Heparin level 0.3-0.7 units/ml Monitor platelets by anticoagulation protocol: Yes   Plan:  Decrease heparin gtt to 1000 units/hr and check level again in 8 hours. F/u vq results.  Verlene Mayer, PharmD, BCPS Pager [phone] 07/16/2011,12:14 PM

## 2011-07-16 NOTE — Progress Notes (Signed)
CSW received referral for potential snfl placement. Pt was transferred to 2900 due to patient decline. CSW informed unit csw of patient transfer. Please contact [name] for further csw needs.   [name], [name]  [phone] .07/16/2011 12:11pm

## 2011-07-16 NOTE — Progress Notes (Signed)
ANTICOAGULATION CONSULT NOTE - Follow Up Consult  Pharmacy Consult for Heparin + Warfarin Indication: pulmonary embolus  No Known Allergies  Patient Measurements: Height: 5\' 8"  (172.7 cm) Weight: 176 lb (79.833 kg) IBW/kg (Calculated) : 68.4  Heparin Dosing Weight: 79.8 kg  Vital Signs: Temp: 97.2 F (36.2 C) (06/24 1600) Temp src: Oral (06/24 1600) BP: 146/73 mmHg (06/24 1600) Pulse Rate: 93  (06/24 1600)  Labs:  Basename 07/16/11 1801 07/16/11 1427 07/16/11 1029 07/16/11 1026  HGB -- 14.1 14.5 --  HCT -- 39.7 41.0 --  PLT -- 128* 115* --  APTT -- -- -- --  LABPROT -- -- -- --  INR -- -- -- --  HEPARINUNFRC -- -- 0.73* --  CREATININE -- 1.67* -- --  CKTOTAL 119 -- -- 140  CKMB 2.8 -- -- 6.1*  TROPONINI 1.31* -- -- 2.04*    Estimated Creatinine Clearance: 34.7 ml/min (by C-G formula based on Cr of 1.67).   Medications:  Infusions:    . heparin 1,000 Units/hr (07/16/11 1358)  . nitroGLYCERIN 5 mcg/min (07/16/11 1430)    Assessment: 78 y.o. M on heparin and now to start warfarin for pulmonary embolus. No VQ scans reported at this time. Heparin level this evening is therapeutic though on higher end of range (HL 0.69, goal of 0.3-0.7). Baseline INR is 1.18. Hgb/Hct/Plt ok. No s/sx of bleeding noted at this time per discussion with nurse. Today is VTE overlap D#1/5 recommended per CHEST guidelines.   Goal of Therapy:  INR 2-3 Heparin level 0.3-0.7 units/ml Monitor platelets by anticoagulation protocol: Yes   Plan:  1. Decrease heparin drip slightly to 950 units/hr (9.5 ml/hr) 2. Warfarin 7.5 mg x 1 dose at 1800 today 3. Daily PT/INR 4. Warfarin book/video 5. Will continue to monitor for any signs/symptoms of bleeding and will follow up with heparin level and PT/INR in the a.m.   Georgina Pillion, PharmD, BCPS Clinical Pharmacist Pager: [phone] 07/16/2011 8:46 PM

## 2011-07-17 ENCOUNTER — Inpatient Hospital Stay (HOSPITAL_COMMUNITY): Payer: Medicare Other

## 2011-07-17 LAB — URINALYSIS, ROUTINE W REFLEX MICROSCOPIC
Ketones, ur: NEGATIVE mg/dL
Protein, ur: NEGATIVE mg/dL
Urobilinogen, UA: 0.2 mg/dL (ref 0.0–1.0)

## 2011-07-17 LAB — CARDIAC PANEL(CRET KIN+CKTOT+MB+TROPI)
Relative Index: INVALID (ref 0.0–2.5)
Total CK: 87 U/L (ref 7–232)

## 2011-07-17 LAB — PROTIME-INR
INR: 1.27 (ref 0.00–1.49)
Prothrombin Time: 16.2 seconds — ABNORMAL HIGH (ref 11.6–15.2)

## 2011-07-17 LAB — CBC
HCT: 39.8 % (ref 39.0–52.0)
Hemoglobin: 13.9 g/dL (ref 13.0–17.0)
MCH: 28 pg (ref 26.0–34.0)
MCV: 80.1 fL (ref 78.0–100.0)
Platelets: 120 10*3/uL — ABNORMAL LOW (ref 150–400)
RBC: 4.97 MIL/uL (ref 4.22–5.81)
WBC: 12.9 10*3/uL — ABNORMAL HIGH (ref 4.0–10.5)

## 2011-07-17 LAB — URINE MICROSCOPIC-ADD ON

## 2011-07-17 LAB — GLUCOSE, CAPILLARY
Glucose-Capillary: 93 mg/dL (ref 70–99)
Glucose-Capillary: 83 mg/dL (ref 70–99)

## 2011-07-17 LAB — URINE CULTURE
Colony Count: NO GROWTH
Culture  Setup Time: 201306250224
Culture: NO GROWTH

## 2011-07-17 MED ORDER — WARFARIN SODIUM 7.5 MG PO TABS
7.5000 mg | ORAL_TABLET | Freq: Once | ORAL | Status: AC
  Administered 2011-07-17: 7.5 mg via ORAL
  Filled 2011-07-17: qty 1

## 2011-07-17 MED ORDER — BIOTENE DRY MOUTH MT LIQD
15.0000 mL | Freq: Two times a day (BID) | OROMUCOSAL | Status: DC
  Administered 2011-07-17 – 2011-07-23 (×12): 15 mL via OROMUCOSAL

## 2011-07-17 MED ORDER — MAGNESIUM HYDROXIDE 400 MG/5ML PO SUSP
30.0000 mL | Freq: Every evening | ORAL | Status: DC | PRN
  Administered 2011-07-17 – 2011-07-22 (×3): 30 mL via ORAL
  Filled 2011-07-17 (×3): qty 30

## 2011-07-17 MED ORDER — PIPERACILLIN-TAZOBACTAM 3.375 G IVPB
3.3750 g | Freq: Three times a day (TID) | INTRAVENOUS | Status: DC
  Administered 2011-07-17 – 2011-07-20 (×10): 3.375 g via INTRAVENOUS
  Filled 2011-07-17 (×16): qty 50

## 2011-07-17 MED ORDER — PIPERACILLIN-TAZOBACTAM 3.375 G IVPB 30 MIN
3.3750 g | Freq: Once | INTRAVENOUS | Status: AC
  Administered 2011-07-17: 3.375 g via INTRAVENOUS
  Filled 2011-07-17: qty 50

## 2011-07-17 MED ORDER — GLUCERNA SHAKE PO LIQD
237.0000 mL | Freq: Two times a day (BID) | ORAL | Status: DC
  Administered 2011-07-17 – 2011-07-23 (×11): 237 mL via ORAL
  Filled 2011-07-17 (×3): qty 237

## 2011-07-17 MED ORDER — VANCOMYCIN HCL 1000 MG IV SOLR
750.0000 mg | INTRAVENOUS | Status: DC
  Administered 2011-07-17 – 2011-07-21 (×5): 750 mg via INTRAVENOUS
  Filled 2011-07-17 (×5): qty 750

## 2011-07-17 MED ORDER — VANCOMYCIN HCL IN DEXTROSE 1-5 GM/200ML-% IV SOLN
1000.0000 mg | Freq: Once | INTRAVENOUS | Status: AC
  Administered 2011-07-17: 1000 mg via INTRAVENOUS
  Filled 2011-07-17: qty 200

## 2011-07-17 NOTE — Progress Notes (Signed)
ANTIBIOTIC CONSULT NOTE - INITIAL  Pharmacy Consult for Vancocin/Zosyn Indication: empiric  No Known Allergies  Patient Measurements: Height: 5\' 8"  (172.7 cm) Weight: 176 lb (79.833 kg) IBW/kg (Calculated) : 68.4   Vital Signs: Temp: 101.3 F (38.5 C) (06/25 0000) Temp src: Oral (06/24 2026) BP: 157/71 mmHg (06/25 0000) Pulse Rate: 95  (06/25 0000) Intake/Output from previous day: 06/24 0701 - 06/25 0700 In: 698.9 [P.O.:540; I.V.:158.9] Out: 1025 [Urine:1025] Intake/Output from this shift: Total I/O In: 298.2 [P.O.:240; I.V.:58.2] Out: 425 [Urine:425]  Labs:  [facility] 07/16/11 1427 07/16/11 1029  WBC 12.1* 10.6*  HGB 14.1 14.5  PLT 128* 115*  LABCREA -- --  CREATININE 1.67* --   Estimated Creatinine Clearance: 34.7 ml/min (by C-G formula based on Cr of 1.67).  Microbiology: Recent Results (from the past 720 hour(s))  MRSA PCR SCREENING     Status: Normal   Collection Time   07/16/11 11:44 AM      Component Value Range Status Comment   MRSA by PCR NEGATIVE  NEGATIVE Final     Medical History: Past Medical History  Diagnosis Date  . GERD (gastroesophageal reflux disease)   . Hypothyroidism   . Chronic back pain   . Stroke   . Gout   . Pulmonary embolism, bilateral 2010  . Hyperlipemia   . Myocardial infarction 1987; 1989  . Anginal pain   . DVT, lower extremity 1980's    LLE  . Asthma     "touch"  . Pneumonia 1970's  . Diabetes mellitus     "on the borderline"  . Renal disorder ~ 03/2011    "wasn't functioning right"  . Arthritis     "mostly in my knees and shoulders"    Medications:  Prescriptions prior to admission  Medication Sig Dispense Refill  . albuterol (PROVENTIL HFA;VENTOLIN HFA) 108 (90 BASE) MCG/ACT inhaler Inhale 2 puffs into the lungs every 6 (six) hours as needed. For shortness of breath      . amiodarone (PACERONE) 200 MG tablet Take 200 mg by mouth daily.       Marland Kitchen amLODipine (NORVASC) 5 MG tablet Take 5 mg by mouth daily.        Marland Kitchen aspirin 81 MG EC tablet Take 81 mg by mouth daily.      . Beclomethasone Dipropionate (QNASL) 80 MCG/ACT AERS Place 1 puff into the nose 2 (two) times daily.      . carvedilol (COREG) 3.125 MG tablet Take 3.125 mg by mouth 2 (two) times daily with a meal.      . dexlansoprazole (DEXILANT) 60 MG capsule Take 60 mg by mouth daily.       . furosemide (LASIX) 40 MG tablet Take 40 mg by mouth daily.      Marland Kitchen glimepiride (AMARYL) 1 MG tablet Take 1 mg by mouth daily before breakfast.       . isosorbide mononitrate (IMDUR) 60 MG 24 hr tablet Take 60 mg by mouth 2 (two) times daily.       Marland Kitchen levothyroxine (SYNTHROID, LEVOTHROID) 50 MCG tablet Take 50 mcg by mouth daily.       Marland Kitchen lubiprostone (AMITIZA) 24 MCG capsule Take 24 mcg by mouth 2 (two) times daily with a meal.      . nitroGLYCERIN (NITROSTAT) 0.4 MG SL tablet Place 0.4 mg under the tongue every 5 (five) minutes as needed. For chest pain       . ranitidine (ZANTAC) 300 MG capsule Take 300 mg by mouth  every evening.      . Tamsulosin HCl (FLOMAX) 0.4 MG CAPS Take 0.4 mg by mouth at bedtime.        Scheduled:    . amiodarone  200 mg Oral Daily  . amLODipine  5 mg Oral Daily  . aspirin EC  81 mg Oral Daily  . atorvastatin  20 mg Oral q1800  . carvedilol  3.125 mg Oral BID WC  . coumadin book   Does not apply Once  . fluticasone  1 puff Inhalation BID  . furosemide  40 mg Oral Daily  . glimepiride  1 mg Oral QAC breakfast  . insulin aspart  0-15 Units Subcutaneous TID WC  . isosorbide mononitrate  60 mg Oral BID  . levothyroxine  50 mcg Oral QAC breakfast  . metoCLOPramide  5 mg Oral TID AC & HS  . pantoprazole  40 mg Oral Q1200  . piperacillin-tazobactam  3.375 g Intravenous Once  . piperacillin-tazobactam (ZOSYN)  IV  3.375 g Intravenous Q8H  . sodium chloride  3 mL Intravenous Q12H  . sucralfate  1 g Oral TID WC & HS  . Tamsulosin HCl  0.4 mg Oral QHS  . vancomycin  750 mg Intravenous Q24H  . vancomycin  1,000 mg Intravenous Once   . warfarin  7.5 mg Oral Once  . warfarin   Does not apply Once  . Warfarin - Pharmacist Dosing Inpatient   Does not apply q1800   Infusions:    . heparin 950 Units/hr (07/16/11 2121)  . nitroGLYCERIN 5 mcg/min (07/16/11 1430)    Assessment: 78yo male has been admitted since 6/19, now having fevers of unknown etiology, to begin IV ABX empirically.  Goal of Therapy:  Vancomycin trough 10-20  Plan:  Will begin vancomycin 1g IV Q24H and Zosyn 3.375g IV Q8H and monitor CBC, Cx, levels prn.  Colleen Can PharmD BCPS 07/17/2011,1:41 AM

## 2011-07-17 NOTE — Progress Notes (Signed)
Subjective:  Patient denies any chest pain or shortness of breath states breathing has improved. Patient spiked to 101.3 last night and cultures are in progress. Started on Vanco and Zosyn. Empirically.  Objective:  Vital Signs in the last 24 hours: Temp:  [97.2 F (36.2 C)-101.3 F (38.5 C)] 98.8 F (37.1 C) (06/25 0745) Pulse Rate:  [80-98] 80  (06/25 0808) Resp:  [20-33] 20  (06/25 0800) BP: (96-159)/(55-105) 104/59 mmHg (06/25 0900) SpO2:  [88 %-100 %] 99 % (06/25 0800) FiO2 (%):  [50 %-100 %] 100 % (06/24 2026) Weight:  [73.9 kg (162 lb 14.7 oz)] 73.9 kg (162 lb 14.7 oz) (06/25 0500)  Intake/Output from previous day: 06/24 0701 - 06/25 0700 In: 897.4 [P.O.:660; I.V.:237.4] Out: 1275 [Urine:1275] Intake/Output from this shift: Total I/O In: 11 [I.V.:11] Out: -   Physical Exam: Neck: no adenopathy, no carotid bruit, no JVD and supple, symmetrical, trachea midline Lungs: Decreased breath sound at bases with occasional rhonchi at left base Heart: regular rate and rhythm, S1, S2 normal and Soft systolic murmur noted Abdomen: soft, non-tender; bowel sounds normal; no masses,  no organomegaly Extremities: extremities normal, atraumatic, no cyanosis or edema  Lab Results:  Basename 07/17/11 0142 07/16/11 1427  WBC 12.9* 12.1*  HGB 13.9 14.1  PLT 120* 128*    Basename 07/16/11 1427  NA 144  K 3.7  CL 104  CO2 27  GLUCOSE 118*  BUN 24*  CREATININE 1.67*    Basename 07/17/11 0144 07/16/11 1801  TROPONINI 0.95* 1.31*   Hepatic Function Panel No results found for this basename: PROT,ALBUMIN,AST,ALT,ALKPHOS,BILITOT,BILIDIR,IBILI in the last 72 hours No results found for this basename: CHOL in the last 72 hours No results found for this basename: PROTIME in the last 72 hours  Imaging: Imaging results have been reviewed and Dg Chest Port 1 View  07/17/2011  *RADIOLOGY REPORT*  Clinical Data: Fever and shortness of breath.  PORTABLE CHEST - 1 VIEW  Comparison:  07/15/2011  Findings: There is marked volume loss compared to the prior examination.  Increased basilar lung densities, particularly on the left side.  There may be a small amount of airspace disease in the left lower lung.  No evidence for a pneumothorax.  Heart size is accentuated by the low lung volumes.  IMPRESSION: Low lung volumes with basilar densities, left side greater than right.  Cannot exclude airspace disease or consolidation at the left lung base.  Original Report Authenticated By: Richarda Overlie, M.D.   Dg Chest Port 1 View  07/15/2011  *RADIOLOGY REPORT*  Clinical Data: 78 year old male with shortness of breath, oxygen desaturation.  PORTABLE CHEST - 1 VIEW  Comparison: 07/12/2011 and earlier.  Findings: Portable semi upright AP view 2238 hours.  Lower lung volumes.  New left basilar and right perihilar opacity most resembles atelectasis.  No pneumothorax or pulmonary edema.  No definite effusion. Visualized tracheal air column is within normal limits.  IMPRESSION: Lower lung volumes with increased left basilar right perihilar opacity, but most likely atelectasis.  No other acute cardiopulmonary abnormality.  Original Report Authenticated By: Harley Hallmark, M.D.    Cardiac Studies:  Assessment/Plan:  Left lower lobe pneumonia Acute pulmonary embolism  Possible small non-Q-wave myocardial infarction  Coronary artery disease  Hypertension  Diabetes matters  History of bilateral PE in the past  Chronic kidney disease stage III  Anemia  GERD  Chronic abdominal pain  History of atrial fibrillation in the past  Plan Continue present management Check cultures Check  final VQ scan result A duplex ultrasound of lower extremities  LOS: 6 days    Jurgen Groeneveld N 07/17/2011, 10:32 AM

## 2011-07-17 NOTE — Progress Notes (Signed)
ANTICOAGULATION CONSULT NOTE - Initial Consult  Pharmacy Consult for heparin/coumadin Indication: r/o PE  No Known Allergies  Patient Measurements: Height: 5\' 8"  (172.7 cm) Weight: 162 lb 14.7 oz (73.9 kg) IBW/kg (Calculated) : 68.4   Vital Signs: Temp: 98.8 F (37.1 C) (06/25 0745) Temp src: Axillary (06/25 0745) BP: 95/59 mmHg (06/25 1000) Pulse Rate: 77  (06/25 1000)  Labs:  Basename 07/17/11 0144 07/17/11 0142 07/16/11 1958 07/16/11 1801 07/16/11 1427 07/16/11 1029 07/16/11 1026  HGB -- 13.9 -- -- 14.1 -- --  HCT -- 39.8 -- -- 39.7 41.0 --  PLT -- 120* -- -- 128* 115* --  APTT -- -- -- -- -- -- --  LABPROT -- 16.2* 15.3* -- -- -- --  INR -- 1.27 1.18 -- -- -- --  HEPARINUNFRC -- 0.57 0.69 -- -- 0.73* --  CREATININE -- -- -- -- 1.67* -- --  CKTOTAL 87 -- -- 119 -- -- 140  CKMB 1.6 -- -- 2.8 -- -- 6.1*  TROPONINI 0.95* -- -- 1.31* -- -- 2.04*    Estimated Creatinine Clearance: 34.7 ml/min (by C-G formula based on Cr of 1.67).   Medical History: Past Medical History  Diagnosis Date  . GERD (gastroesophageal reflux disease)   . Hypothyroidism   . Chronic back pain   . Stroke   . Gout   . Pulmonary embolism, bilateral 2010  . Hyperlipemia   . Myocardial infarction 1987; 1989  . Anginal pain   . DVT, lower extremity 1980's    LLE  . Asthma     "touch"  . Pneumonia 1970's  . Diabetes mellitus     "on the borderline"  . Renal disorder ~ 03/2011    "wasn't functioning right"  . Arthritis     "mostly in my knees and shoulders"    Medications:  Prescriptions prior to admission  Medication Sig Dispense Refill  . albuterol (PROVENTIL HFA;VENTOLIN HFA) 108 (90 BASE) MCG/ACT inhaler Inhale 2 puffs into the lungs every 6 (six) hours as needed. For shortness of breath      . amiodarone (PACERONE) 200 MG tablet Take 200 mg by mouth daily.       Marland Kitchen amLODipine (NORVASC) 5 MG tablet Take 5 mg by mouth daily.      Marland Kitchen aspirin 81 MG EC tablet Take 81 mg by mouth daily.       . Beclomethasone Dipropionate (QNASL) 80 MCG/ACT AERS Place 1 puff into the nose 2 (two) times daily.      . carvedilol (COREG) 3.125 MG tablet Take 3.125 mg by mouth 2 (two) times daily with a meal.      . dexlansoprazole (DEXILANT) 60 MG capsule Take 60 mg by mouth daily.       . furosemide (LASIX) 40 MG tablet Take 40 mg by mouth daily.      Marland Kitchen glimepiride (AMARYL) 1 MG tablet Take 1 mg by mouth daily before breakfast.       . isosorbide mononitrate (IMDUR) 60 MG 24 hr tablet Take 60 mg by mouth 2 (two) times daily.       Marland Kitchen levothyroxine (SYNTHROID, LEVOTHROID) 50 MCG tablet Take 50 mcg by mouth daily.       Marland Kitchen lubiprostone (AMITIZA) 24 MCG capsule Take 24 mcg by mouth 2 (two) times daily with a meal.      . nitroGLYCERIN (NITROSTAT) 0.4 MG SL tablet Place 0.4 mg under the tongue every 5 (five) minutes as needed. For chest pain       .  ranitidine (ZANTAC) 300 MG capsule Take 300 mg by mouth every evening.      . Tamsulosin HCl (FLOMAX) 0.4 MG CAPS Take 0.4 mg by mouth at bedtime.        Scheduled:     . amiodarone  200 mg Oral Daily  . amLODipine  5 mg Oral Daily  . aspirin EC  81 mg Oral Daily  . atorvastatin  20 mg Oral q1800  . carvedilol  3.125 mg Oral BID WC  . coumadin book   Does not apply Once  . feeding supplement  237 mL Oral BID  . fluticasone  1 puff Inhalation BID  . furosemide  40 mg Oral Daily  . glimepiride  1 mg Oral QAC breakfast  . insulin aspart  0-15 Units Subcutaneous TID WC  . levothyroxine  50 mcg Oral QAC breakfast  . metoCLOPramide  5 mg Oral TID AC & HS  . pantoprazole  40 mg Oral Q1200  . piperacillin-tazobactam  3.375 g Intravenous Once  . piperacillin-tazobactam (ZOSYN)  IV  3.375 g Intravenous Q8H  . sodium chloride  3 mL Intravenous Q12H  . sucralfate  1 g Oral TID WC & HS  . Tamsulosin HCl  0.4 mg Oral QHS  . vancomycin  750 mg Intravenous Q24H  . vancomycin  1,000 mg Intravenous Once  . warfarin  7.5 mg Oral Once  . warfarin   Does not apply  Once  . Warfarin - Pharmacist Dosing Inpatient   Does not apply q1800  . DISCONTD: isosorbide mononitrate  60 mg Oral BID    Assessment: 78yo male with h/o PE, not currently anticoagulated, concern for new PE given hypoxemia, and positive D-dimer, started on heparin and coumadin. Xa level is therapeutic, no bleeding reported. Day #2/5 minimum overlap but still waiting for results of VQ scan.   Goal of Therapy:  Heparin level 0.3-0.7 units/ml Monitor platelets by anticoagulation protocol: Yes   Plan:  Continue heparin gtt at 950 units/hr and repeat coumadin 7.5mg  today. F/u vq results.  Verlene Mayer, PharmD, BCPS Pager [phone] 07/17/2011,11:34 AM

## 2011-07-17 NOTE — Progress Notes (Signed)
Nutrition Follow-up Pt was scheduled to d/c on 6/23, had episodes of low sats, lethargic. Had c/o chest pain, transferred to 2900.  Currently on non-rebreather mask. Breakfast tray in room, untouched. Pt reports no appetite, has not gotten any Glucerna shakes. Willing to try and drink them BID, RD will order.    Diet Order:  Carb Mod Medium, Glucerna shake PRN PO intake 50-100%  Meds: Scheduled Meds:   . amiodarone  200 mg Oral Daily  . amLODipine  5 mg Oral Daily  . aspirin EC  81 mg Oral Daily  . atorvastatin  20 mg Oral q1800  . carvedilol  3.125 mg Oral BID WC  . coumadin book   Does not apply Once  . fluticasone  1 puff Inhalation BID  . furosemide  40 mg Oral Daily  . glimepiride  1 mg Oral QAC breakfast  . insulin aspart  0-15 Units Subcutaneous TID WC  . isosorbide mononitrate  60 mg Oral BID  . levothyroxine  50 mcg Oral QAC breakfast  . metoCLOPramide  5 mg Oral TID AC & HS  . pantoprazole  40 mg Oral Q1200  . piperacillin-tazobactam  3.375 g Intravenous Once  . piperacillin-tazobactam (ZOSYN)  IV  3.375 g Intravenous Q8H  . sodium chloride  3 mL Intravenous Q12H  . sucralfate  1 g Oral TID WC & HS  . Tamsulosin HCl  0.4 mg Oral QHS  . vancomycin  750 mg Intravenous Q24H  . vancomycin  1,000 mg Intravenous Once  . warfarin  7.5 mg Oral Once  . warfarin   Does not apply Once  . Warfarin - Pharmacist Dosing Inpatient   Does not apply q1800   Continuous Infusions:   . heparin 950 Units/hr (07/16/11 2121)  . nitroGLYCERIN 5 mcg/min (07/16/11 1430)   PRN Meds:.sodium chloride, acetaminophen, albuterol, ALPRAZolam, alum & mag hydroxide-simeth, bisacodyl, feeding supplement, magnesium hydroxide, nitroGLYCERIN, ondansetron (ZOFRAN) IV, oxyCODONE-acetaminophen, sodium chloride, technetium albumin aggregated  Labs:  CMP     Component Value Date/Time   NA 144 07/16/2011 1427   K 3.7 07/16/2011 1427   CL 104 07/16/2011 1427   CO2 27 07/16/2011 1427   GLUCOSE 118* 07/16/2011  1427   BUN 24* 07/16/2011 1427   CREATININE 1.67* 07/16/2011 1427   CALCIUM 9.1 07/16/2011 1427   PROT 7.4 07/11/2011 1834   ALBUMIN 3.5 07/11/2011 1834   AST 36 07/11/2011 1834   ALT 30 07/11/2011 1834   ALKPHOS 120* 07/11/2011 1834   BILITOT 0.4 07/11/2011 1834   GFRNONAA 37* 07/16/2011 1427   GFRAA 43* 07/16/2011 1427     Intake/Output Summary (Last 24 hours) at 07/17/11 0937 Last data filed at 07/17/11 0800  Gross per 24 hour  Intake 608.39 ml  Output   1075 ml  Net -466.61 ml    Weight Status:  162 lbs, down 11 lbs from admission weight.   Re-estimated needs:  1800-2000 kcal, 85-95 gm protein   Nutrition Dx:  Unintentional weight loss r/t unknown etiology AEB 11% weight loss.   Goal:  Oral intake of meals and supplements to meet >/=90% estimated nutrition needs, unmet  Intervention:   1. Change Glucerna shakes from PRN to BID between meals 2. RD will continue to follow  Monitor:  PO intake, weight, labs, I/O's   Rudean Haskell Pager #:  [phone]

## 2011-07-17 NOTE — Progress Notes (Signed)
Clinical Social Worker was informed by CM that patient and family were interested in advance directive paperwork. CSW provided patient with the advance directive packet and informed them that the documents can be notarized during this hospital admission.   Rozetta Nunnery MSW, [employer] [phone]

## 2011-07-18 DIAGNOSIS — I2699 Other pulmonary embolism without acute cor pulmonale: Secondary | ICD-10-CM

## 2011-07-18 LAB — HEPARIN LEVEL (UNFRACTIONATED): Heparin Unfractionated: 0.4 IU/mL (ref 0.30–0.70)

## 2011-07-18 LAB — CBC
HCT: 32.9 % — ABNORMAL LOW (ref 39.0–52.0)
Hemoglobin: 11.5 g/dL — ABNORMAL LOW (ref 13.0–17.0)
MCHC: 35 g/dL (ref 30.0–36.0)
RBC: 4.13 MIL/uL — ABNORMAL LOW (ref 4.22–5.81)

## 2011-07-18 LAB — GLUCOSE, CAPILLARY
Glucose-Capillary: 78 mg/dL (ref 70–99)
Glucose-Capillary: 150 mg/dL — ABNORMAL HIGH (ref 70–99)
Glucose-Capillary: 115 mg/dL — ABNORMAL HIGH (ref 70–99)

## 2011-07-18 LAB — PROTIME-INR
INR: 1.55 — ABNORMAL HIGH (ref 0.00–1.49)
Prothrombin Time: 18.9 seconds — ABNORMAL HIGH (ref 11.6–15.2)

## 2011-07-18 MED ORDER — WARFARIN SODIUM 7.5 MG PO TABS
7.5000 mg | ORAL_TABLET | Freq: Once | ORAL | Status: AC
  Administered 2011-07-18: 7.5 mg via ORAL
  Filled 2011-07-18: qty 1

## 2011-07-18 NOTE — Progress Notes (Signed)
CRITICAL VALUE ALERT  Critical value received:  Gram positive cocci in clusters (Aerobic)  Date of notification:  07/18/2011  Time of notification:  01:00 a.m.  Critical value read back:yes  Nurse who received alert:  Sander Radon RN  MD notified (1st page):  Dr.Kadakia  Time of first page:  1:20a.m.  MD notified (2nd page):  Time of second page:  Responding MD: Dr. [name]  Time MD responded: 01:55 a.m.

## 2011-07-18 NOTE — Progress Notes (Signed)
Subjective:  Patient denies any chest pain or shortness of breath states breathing has improved  Objective:  Vital Signs in the last 24 hours: Temp:  [98.5 F (36.9 C)-100 F (37.8 C)] 99.6 F (37.6 C) (06/26 0821) Pulse Rate:  [71-88] 71  (06/26 0941) Resp:  [20-27] 20  (06/26 0821) BP: (103-145)/(32-87) 128/55 mmHg (06/26 0941) SpO2:  [90 %-98 %] 95 % (06/26 0941) FiO2 (%):  [40 %] 40 % (06/26 0941) Weight:  [76.4 kg (168 lb 6.9 oz)] 76.4 kg (168 lb 6.9 oz) (06/26 0500)  Intake/Output from previous day: 06/25 0701 - 06/26 0700 In: 1263 [P.O.:820; I.V.:443] Out: 1204 [Urine:1200; Stool:4] Intake/Output from this shift:    Physical Exam: Neck: no adenopathy, no carotid bruit, no JVD and supple, symmetrical, trachea midline Lungs: Decreased breath sound at bases with left basilar rhonchi Heart: regular rate and rhythm, S1, S2 normal and Soft systolic murmur noted Abdomen: soft, non-tender; bowel sounds normal; no masses,  no organomegaly Extremities: extremities normal, atraumatic, no cyanosis or edema  Lab Results:  Basename 07/18/11 0540 07/17/11 0142  WBC 11.6* 12.9*  HGB 11.5* 13.9  PLT 122* 120*    Basename 07/16/11 1427  NA 144  K 3.7  CL 104  CO2 27  GLUCOSE 118*  BUN 24*  CREATININE 1.67*    Basename 07/17/11 0144 07/16/11 1801  TROPONINI 0.95* 1.31*   Hepatic Function Panel No results found for this basename: PROT,ALBUMIN,AST,ALT,ALKPHOS,BILITOT,BILIDIR,IBILI in the last 72 hours No results found for this basename: CHOL in the last 72 hours No results found for this basename: PROTIME in the last 72 hours  Imaging: Imaging results have been reviewed and Nm Pulmonary Perfusion  07/16/2011  *RADIOLOGY REPORT*  Clinical Data:  History of deep venous thrombosis.  Shortness of breath.  Concern pulmonary embolism.  NUCLEAR MEDICINE VENTILATION - PERFUSION LUNG SCAN  Technique:    Perfusion images were obtained in multiple projections after intravenous  injection of Tc-13m MAA.  Radiopharmaceuticals: 3.0 mCi Tc-67m MAA.  Comparison: Chest radiograph 07/15/2011  Findings:  Perfusion: Large peripheral wedge shaped perfusion defect within the right lower lobe.  Small defect within the right upper lobe. There is a small peripheral perfusion defect in the left upper lobe.   IMPRESSION:  Bilateral peripheral perfusion defects, greater on the right, are concerning for acute pulmonary embolism.  High probability.  Findings conveyed to Drucie Opitz, floor nurse  on 07/16/2011 at 16 55 hours  Original Report Authenticated By: Genevive Bi, M.D.   Dg Chest Port 1 View  07/17/2011  *RADIOLOGY REPORT*  Clinical Data: Fever and shortness of breath.  PORTABLE CHEST - 1 VIEW  Comparison: 07/15/2011  Findings: There is marked volume loss compared to the prior examination.  Increased basilar lung densities, particularly on the left side.  There may be a small amount of airspace disease in the left lower lung.  No evidence for a pneumothorax.  Heart size is accentuated by the low lung volumes.  IMPRESSION: Low lung volumes with basilar densities, left side greater than right.  Cannot exclude airspace disease or consolidation at the left lung base.  Original Report Authenticated By: Richarda Overlie, M.D.    Cardiac Studies:  Assessment/Plan:  Left lower lobe pneumonia  Acute pulmonary embolism  Bilateral DVT Possible small non-Q-wave myocardial infarction  Coronary artery disease  Hypertension  Diabetes matters  History of bilateral PE in the past  Chronic kidney disease stage III  Anemia  GERD  Chronic abdominal pain  History  of atrial fibrillation in the past  Plan Continue present management Check labs in a.m.  LOS: 7 days    [name] N 07/18/2011, 11:48 AM

## 2011-07-18 NOTE — Progress Notes (Signed)
ANTICOAGULATION CONSULT NOTE - Follow Up Consult  Pharmacy Consult for heparin/coumadin Indication: r/o PE  No Known Allergies  Patient Measurements: Height: 5\' 8"  (172.7 cm) Weight: 168 lb 6.9 oz (76.4 kg) IBW/kg (Calculated) : 68.4   Vital Signs: Temp: 99.6 F (37.6 C) (06/26 0821) Temp src: Oral (06/26 0821) BP: 128/55 mmHg (06/26 0941) Pulse Rate: 71  (06/26 0941)  Labs:  Basename 07/18/11 0540 07/17/11 0144 07/17/11 0142 07/16/11 1958 07/16/11 1801 07/16/11 1427 07/16/11 1026  HGB 11.5* -- 13.9 -- -- -- --  HCT 32.9* -- 39.8 -- -- 39.7 --  PLT 122* -- 120* -- -- 128* --  APTT -- -- -- -- -- -- --  LABPROT 18.9* -- 16.2* 15.3* -- -- --  INR 1.55* -- 1.27 1.18 -- -- --  HEPARINUNFRC 0.40 -- 0.57 0.69 -- -- --  CREATININE -- -- -- -- -- 1.67* --  CKTOTAL -- 87 -- -- 119 -- 140  CKMB -- 1.6 -- -- 2.8 -- 6.1*  TROPONINI -- 0.95* -- -- 1.31* -- 2.04*    Estimated Creatinine Clearance: 34.7 ml/min (by C-G formula based on Cr of 1.67).   Medical History: Past Medical History  Diagnosis Date  . GERD (gastroesophageal reflux disease)   . Hypothyroidism   . Chronic back pain   . Stroke   . Gout   . Pulmonary embolism, bilateral 2010  . Hyperlipemia   . Myocardial infarction 1987; 1989  . Anginal pain   . DVT, lower extremity 1980's    LLE  . Asthma     "touch"  . Pneumonia 1970's  . Diabetes mellitus     "on the borderline"  . Renal disorder ~ 03/2011    "wasn't functioning right"  . Arthritis     "mostly in my knees and shoulders"    Medications:  Prescriptions prior to admission  Medication Sig Dispense Refill  . albuterol (PROVENTIL HFA;VENTOLIN HFA) 108 (90 BASE) MCG/ACT inhaler Inhale 2 puffs into the lungs every 6 (six) hours as needed. For shortness of breath      . amiodarone (PACERONE) 200 MG tablet Take 200 mg by mouth daily.       Marland Kitchen amLODipine (NORVASC) 5 MG tablet Take 5 mg by mouth daily.      Marland Kitchen aspirin 81 MG EC tablet Take 81 mg by mouth  daily.      . Beclomethasone Dipropionate (QNASL) 80 MCG/ACT AERS Place 1 puff into the nose 2 (two) times daily.      . carvedilol (COREG) 3.125 MG tablet Take 3.125 mg by mouth 2 (two) times daily with a meal.      . dexlansoprazole (DEXILANT) 60 MG capsule Take 60 mg by mouth daily.       . furosemide (LASIX) 40 MG tablet Take 40 mg by mouth daily.      Marland Kitchen glimepiride (AMARYL) 1 MG tablet Take 1 mg by mouth daily before breakfast.       . isosorbide mononitrate (IMDUR) 60 MG 24 hr tablet Take 60 mg by mouth 2 (two) times daily.       Marland Kitchen levothyroxine (SYNTHROID, LEVOTHROID) 50 MCG tablet Take 50 mcg by mouth daily.       Marland Kitchen lubiprostone (AMITIZA) 24 MCG capsule Take 24 mcg by mouth 2 (two) times daily with a meal.      . nitroGLYCERIN (NITROSTAT) 0.4 MG SL tablet Place 0.4 mg under the tongue every 5 (five) minutes as needed. For chest pain       .  ranitidine (ZANTAC) 300 MG capsule Take 300 mg by mouth every evening.      . Tamsulosin HCl (FLOMAX) 0.4 MG CAPS Take 0.4 mg by mouth at bedtime.        Scheduled:     . amiodarone  200 mg Oral Daily  . amLODipine  5 mg Oral Daily  . antiseptic oral rinse  15 mL Mouth Rinse BID  . aspirin EC  81 mg Oral Daily  . atorvastatin  20 mg Oral q1800  . carvedilol  3.125 mg Oral BID WC  . feeding supplement  237 mL Oral BID  . fluticasone  1 puff Inhalation BID  . furosemide  40 mg Oral Daily  . glimepiride  1 mg Oral QAC breakfast  . insulin aspart  0-15 Units Subcutaneous TID WC  . levothyroxine  50 mcg Oral QAC breakfast  . metoCLOPramide  5 mg Oral TID AC & HS  . pantoprazole  40 mg Oral Q1200  . piperacillin-tazobactam (ZOSYN)  IV  3.375 g Intravenous Q8H  . sodium chloride  3 mL Intravenous Q12H  . sucralfate  1 g Oral TID WC & HS  . Tamsulosin HCl  0.4 mg Oral QHS  . vancomycin  750 mg Intravenous Q24H  . warfarin  7.5 mg Oral ONCE-1800  . warfarin   Does not apply Once  . Warfarin - Pharmacist Dosing Inpatient   Does not apply q1800    . DISCONTD: isosorbide mononitrate  60 mg Oral BID    Admit Complaint: 78 y.o.  male  admitted 07/11/2011 with h/o PE, not currently anticoagulated, concern for new PE given hypoxemia, and positive D-dimer.  Pharmacy consulted to dose heparin & warfarin.  Overnight Events: 07/18/2011 blood cultures positive for GPC  Assessment: Anticoagulation: DVT, h/o PE, d#3/5 minimum overlap, INR rising but remains below goal, heparin level at goal, H/H trend down 11.5/32.9, no bleeding noted.    Infectious Disease: LLL PNA, WBC 11.6, afebrile, Tmax 37.8 Antibiotics: 6/25 Zosyn 6/25 vancomycin Cultures: 6/25 blood x 2, GPC clusters 6/25 urine  Cardiovascular: CAD (MI, stent), stroke, hyperlipidemia: BP at goal, HR 70s, amiodarone, amlodipine, ASA, atorvastatin, coreg, furo, IMDUR,   Endocrinology: hypothyroid: levothyroxine; DM: glimeperide, SSI, glucose < 100  Gastrointestinal / Nutrition: GERD, protonix (on PPI PTA, and zantac, and carafate)  Neurology/MSK: Chronic back pain  Nephrology/Urology/Electrolytes: CKD, CrCl34  PTA Medication Issues: Home Meds Not Ordered: Lubiprustone  Best Practices: DVT Prophylaxis:  Heparin, warfarin  Goal of Therapy:  Heparin level 0.3-0.7 units/ml Monitor platelets by anticoagulation protocol: Yes   Plan:  Continue heparin gtt at 950 units/hr and repeat coumadin 7.5mg  today.   Thank you for allowing pharmacy to be a part of this patients care team.  Lovenia Kim Pharm.D., BCPS Clinical Pharmacist 07/18/2011 10:33 AM Pager: [phone] Phone: [phone]

## 2011-07-18 NOTE — Progress Notes (Signed)
*  PRELIMINARY RESULTS* Vascular Ultrasound Lower extremity venous duplex has been completed.  Preliminary findings: Right= Evidence of partial DVT involving a single posterior tibial vein. Left= Evidence of partial DVT involving the popliteal vein.     Farrel Demark RDMS 07/18/2011, 11:42 AM

## 2011-07-19 LAB — CARDIAC PANEL(CRET KIN+CKTOT+MB+TROPI)
Relative Index: INVALID (ref 0.0–2.5)
Total CK: 48 U/L (ref 7–232)
Troponin I: 0.32 ng/mL (ref <0.30)

## 2011-07-19 LAB — CULTURE, BLOOD (ROUTINE X 2)

## 2011-07-19 LAB — BASIC METABOLIC PANEL
CO2: 30 mEq/L (ref 19–32)
Chloride: 109 mEq/L (ref 96–112)
Creatinine, Ser: 1.8 mg/dL — ABNORMAL HIGH (ref 0.50–1.35)
GFR calc Af Amer: 40 mL/min — ABNORMAL LOW (ref >90)
Potassium: 3.2 mEq/L — ABNORMAL LOW (ref 3.5–5.1)

## 2011-07-19 LAB — PROTIME-INR
INR: 1.8 — ABNORMAL HIGH (ref 0.00–1.49)
Prothrombin Time: 21.2 seconds — ABNORMAL HIGH (ref 11.6–15.2)

## 2011-07-19 LAB — GLUCOSE, CAPILLARY
Glucose-Capillary: 128 mg/dL — ABNORMAL HIGH (ref 70–99)
Glucose-Capillary: 199 mg/dL — ABNORMAL HIGH (ref 70–99)

## 2011-07-19 LAB — CBC
HCT: 35.9 % — ABNORMAL LOW (ref 39.0–52.0)
Hemoglobin: 12.5 g/dL — ABNORMAL LOW (ref 13.0–17.0)
MCH: 27.7 pg (ref 26.0–34.0)
MCHC: 34.8 g/dL (ref 30.0–36.0)
RBC: 4.51 MIL/uL (ref 4.22–5.81)

## 2011-07-19 LAB — HEPARIN LEVEL (UNFRACTIONATED): Heparin Unfractionated: 0.29 IU/mL — ABNORMAL LOW (ref 0.30–0.70)

## 2011-07-19 MED ORDER — WARFARIN SODIUM 7.5 MG PO TABS
7.5000 mg | ORAL_TABLET | Freq: Once | ORAL | Status: AC
  Administered 2011-07-19: 7.5 mg via ORAL
  Filled 2011-07-19: qty 1

## 2011-07-19 NOTE — Progress Notes (Signed)
CSW aware of pt potential disposition needs. Previously patient plan was to discharge home. CSW awaiting pt/ot evaluation to help determine if patient disposition needs have changed. .Clinical social worker continuing to follow pt to assist with pt dc plans and further csw needs.   Catha Gosselin, Theresia Majors  [phone] .07/19/2011 10:51am covering csw

## 2011-07-19 NOTE — Progress Notes (Signed)
ANTICOAGULATION CONSULT NOTE - Follow Up Consult  Pharmacy Consult for heparin and coumadin Indication: DVT and PE  No Known Allergies  Patient Measurements: Height: 5\' 8"  (172.7 cm) Weight: 168 lb 6.9 oz (76.4 kg) IBW/kg (Calculated) : 68.4  Heparin Dosing Weight:   Vital Signs: Temp: 98.7 F (37.1 C) (06/27 0737) Temp src: Oral (06/27 0737) BP: 150/59 mmHg (06/27 0737) Pulse Rate: 66  (06/27 0737)  Labs:  Basename 07/19/11 0528 07/18/11 0540 07/17/11 0144 07/17/11 0142 07/16/11 1801 07/16/11 1427  HGB 12.5* 11.5* -- -- -- --  HCT 35.9* 32.9* -- 39.8 -- --  PLT 146* 122* -- 120* -- --  APTT -- -- -- -- -- --  LABPROT 21.2* 18.9* -- 16.2* -- --  INR 1.80* 1.55* -- 1.27 -- --  HEPARINUNFRC 0.29* 0.40 -- 0.57 -- --  CREATININE 1.80* -- -- -- -- 1.67*  CKTOTAL 48 -- 87 -- 119 --  CKMB 1.4 -- 1.6 -- 2.8 --  TROPONINI 0.32* -- 0.95* -- 1.31* --    Estimated Creatinine Clearance: 32.2 ml/min (by C-G formula based on Cr of 1.8).   Assessment: Patient is a 78 y.o M on anticoagulation overlap day #4 of 5 days minimum for DVT and PE.  INR is subtherapeutic but is trending up towards goal range.  Heparin level is slightly below goal this morning.  Goal of Therapy:  Heparin level 0.3-0.7 units/ml Monitor platelets by anticoagulation protocol: Yes   Plan:  1) increase heparin drip to 1050 units/hr 2) coumadin 7.5mg  x1   Meghan Warshawsky P 07/19/2011,11:46 AM

## 2011-07-19 NOTE — Progress Notes (Signed)
Subjective:  Patient denies any chest pain states breathing is improved Patient tolerating heparin and Coumadin  Objective:  Vital Signs in the last 24 hours: Temp:  [98.5 F (36.9 C)-99.9 F (37.7 C)] 98.7 F (37.1 C) (06/27 0737) Pulse Rate:  [64-79] 66  (06/27 0737) Resp:  [18-24] 22  (06/27 0737) BP: (122-150)/(47-76) 150/59 mmHg (06/27 0737) SpO2:  [92 %-98 %] 97 % (06/27 0737) FiO2 (%):  [35 %-40 %] 35 % (06/26 2000)  Intake/Output from previous day: 06/26 0701 - 06/27 0700 In: 1384 [P.O.:360; I.V.:424; IV Piggyback:600] Out: 1803 [Urine:1800; Stool:3] Intake/Output from this shift:    Physical Exam: Neck: no adenopathy, no carotid bruit, no JVD and supple, symmetrical, trachea midline Lungs: Decreased breath sound at bases with left basilar rhonchi Heart: regular rate and rhythm, S1, S2 normal and Soft systolic murmur noted Abdomen: soft, non-tender; bowel sounds normal; no masses,  no organomegaly Extremities: extremities normal, atraumatic, no cyanosis or edema  Lab Results:  Basename 07/19/11 0528 07/18/11 0540  WBC 9.3 11.6*  HGB 12.5* 11.5*  PLT 146* 122*    Basename 07/19/11 0528 07/16/11 1427  NA 148* 144  K 3.2* 3.7  CL 109 104  CO2 30 27  GLUCOSE 81 118*  BUN 22 24*  CREATININE 1.80* 1.67*    Basename 07/19/11 0528 07/17/11 0144  TROPONINI 0.32* 0.95*   Hepatic Function Panel No results found for this basename: PROT,ALBUMIN,AST,ALT,ALKPHOS,BILITOT,BILIDIR,IBILI in the last 72 hours No results found for this basename: CHOL in the last 72 hours No results found for this basename: PROTIME in the last 72 hours  Imaging: Imaging results have been reviewed and No results found.  Cardiac Studies:  Assessment/Plan:  Resolving Left lower lobe pneumonia  Acute pulmonary embolism  Bilateral DVT  Possible small non-Q-wave myocardial infarction  Coronary artery disease  Hypertension  Diabetes matters  History of bilateral PE in the past  Chronic  kidney disease stage III  Anemia  GERD  Chronic abdominal pain  History of atrial fibrillation in the past  Plan Continue present management Transfer to telemetry   LOS: 8 days    Elisabet Gutzmer N 07/19/2011, 9:20 AM

## 2011-07-20 LAB — GLUCOSE, CAPILLARY
Glucose-Capillary: 76 mg/dL (ref 70–99)
Glucose-Capillary: 99 mg/dL (ref 70–99)
Glucose-Capillary: 93 mg/dL (ref 70–99)
Glucose-Capillary: 164 mg/dL — ABNORMAL HIGH (ref 70–99)

## 2011-07-20 LAB — CBC
HCT: 36.3 % — ABNORMAL LOW (ref 39.0–52.0)
Hemoglobin: 12.8 g/dL — ABNORMAL LOW (ref 13.0–17.0)
MCV: 78.7 fL (ref 78.0–100.0)
RBC: 4.61 MIL/uL (ref 4.22–5.81)
RDW: 15.8 % — ABNORMAL HIGH (ref 11.5–15.5)
WBC: 8.8 10*3/uL (ref 4.0–10.5)

## 2011-07-20 LAB — HEPARIN LEVEL (UNFRACTIONATED): Heparin Unfractionated: 0.3 IU/mL (ref 0.30–0.70)

## 2011-07-20 LAB — PROTIME-INR: INR: 2.32 — ABNORMAL HIGH (ref 0.00–1.49)

## 2011-07-20 MED ORDER — ISOSORBIDE MONONITRATE ER 60 MG PO TB24
60.0000 mg | ORAL_TABLET | Freq: Every day | ORAL | Status: DC
  Administered 2011-07-20 – 2011-07-23 (×4): 60 mg via ORAL
  Filled 2011-07-20 (×4): qty 1

## 2011-07-20 MED ORDER — WARFARIN SODIUM 5 MG PO TABS
5.0000 mg | ORAL_TABLET | Freq: Once | ORAL | Status: AC
  Administered 2011-07-20: 5 mg via ORAL
  Filled 2011-07-20: qty 1

## 2011-07-20 MED ORDER — PIPERACILLIN-TAZOBACTAM 3.375 G IVPB
3.3750 g | Freq: Three times a day (TID) | INTRAVENOUS | Status: DC
  Administered 2011-07-20 – 2011-07-22 (×5): 3.375 g via INTRAVENOUS
  Filled 2011-07-20 (×7): qty 50

## 2011-07-20 MED ORDER — AMLODIPINE BESYLATE 5 MG PO TABS
5.0000 mg | ORAL_TABLET | Freq: Every day | ORAL | Status: DC
  Administered 2011-07-20 – 2011-07-23 (×4): 5 mg via ORAL
  Filled 2011-07-20 (×4): qty 1

## 2011-07-20 NOTE — Progress Notes (Signed)
ANTICOAGULATION and ANTIBIOTIC CONSULT NOTE - Follow Up Consult  Pharmacy Consult for Coumadin, Vancomycin, and Zosyn Indication: pulmonary embolus and DVT and r/o PNA  No Known Allergies  Patient Measurements: Height: 5\' 8"  (172.7 cm) Weight: 166 lb 7.2 oz (75.5 kg) IBW/kg (Calculated) : 68.4   Vital Signs: Temp: 98.2 F (36.8 C) (06/28 0339) Temp src: Oral (06/28 0339) BP: 171/76 mmHg (06/28 0420) Pulse Rate: 76  (06/28 0420)  Labs:  Basename 07/20/11 0530 07/19/11 0528 07/18/11 0540  HGB 12.8* 12.5* --  HCT 36.3* 35.9* 32.9*  PLT 155 146* 122*  APTT -- -- --  LABPROT 25.9* 21.2* 18.9*  INR 2.32* 1.80* 1.55*  HEPARINUNFRC 0.30 0.29* 0.40  CREATININE -- 1.80* --  CKTOTAL -- 48 --  CKMB -- 1.4 --  TROPONINI -- 0.32* --    Basename 07/20/11 0530 07/19/11 0528 07/18/11 0540  WBC 8.8 9.3 11.6*  HGB 12.8* 12.5* 11.5*  PLT 155 146* 122*  LABCREA -- -- --  CREATININE -- 1.80* --    Estimated Creatinine Clearance: 32.2 ml/min (by C-G formula based on Cr of 1.8).  Antibiotics and Microbiology: 6/25 Zosyn>> 6/25 vancomycin>>  6/25 blood x 2>> CNS in 1/2 6/25 urine>> negative   Medications:  Scheduled:    . amiodarone  200 mg Oral Daily  . amLODipine  5 mg Oral Daily  . antiseptic oral rinse  15 mL Mouth Rinse BID  . aspirin EC  81 mg Oral Daily  . atorvastatin  20 mg Oral q1800  . carvedilol  3.125 mg Oral BID WC  . feeding supplement  237 mL Oral BID  . fluticasone  1 puff Inhalation BID  . furosemide  40 mg Oral Daily  . glimepiride  1 mg Oral QAC breakfast  . insulin aspart  0-15 Units Subcutaneous TID WC  . isosorbide mononitrate  60 mg Oral Daily  . levothyroxine  50 mcg Oral QAC breakfast  . metoCLOPramide  5 mg Oral TID AC & HS  . pantoprazole  40 mg Oral Q1200  . piperacillin-tazobactam (ZOSYN)  IV  3.375 g Intravenous Q8H  . sodium chloride  3 mL Intravenous Q12H  . sucralfate  1 g Oral TID WC & HS  . Tamsulosin HCl  0.4 mg Oral QHS  .  vancomycin  750 mg Intravenous Q24H  . warfarin  7.5 mg Oral ONCE-1800  . Warfarin - Pharmacist Dosing Inpatient   Does not apply q1800  . DISCONTD: amLODipine  5 mg Oral Daily  . DISCONTD: piperacillin-tazobactam (ZOSYN)  IV  3.375 g Intravenous Q8H    Assessment: 78 yo M continues on Coumadin for PE/DVT.  Heparin was stopped today due to therapeutic INR on Coumadin.  Pt has not completed 5 days of overlap, however pt is experiencing epistaxis and MD is aware and wants to discontinue overlap early.    Pt also on day #3 IV antibiotics for LLL PNA.  Pt is afebrile, WBC nl.  Pt is clinically improving and anticipate transition to oral antibiotics soon.  Renal function remains stable.  Goal of Therapy:  INR 2-3 Vancomycin 15-20 mcg/ml   Plan:  Coumadin 5 mg PO x 1 tonight. D/C heparin labs. Continue Vancomycin and Zosyn. Follow up LOT of antibiotics.  [employer], Pharm.D., BCPS Clinical Pharmacist Pager [phone] 07/20/2011 2:35 PM

## 2011-07-20 NOTE — Progress Notes (Signed)
Subjective:  Patient denies any chest pain or shortness of breath states overall feels better complains of occasional epistaxis. INR today is in therapeutic range  Objective:  Vital Signs in the last 24 hours: Temp:  [98.1 F (36.7 C)-99.3 F (37.4 C)] 98.2 F (36.8 C) (06/28 0339) Pulse Rate:  [72-79] 76  (06/28 0420) Resp:  [18-21] 18  (06/28 0420) BP: (144-181)/(56-79) 171/76 mmHg (06/28 0420) SpO2:  [90 %-100 %] 90 % (06/28 0339) Weight:  [75.5 kg (166 lb 7.2 oz)] 75.5 kg (166 lb 7.2 oz) (06/28 0339)  Intake/Output from previous day: 06/27 0701 - 06/28 0700 In: 1340.3 [P.O.:720; I.V.:345.3; IV Piggyback:275] Out: 578 [Urine:575; Stool:3] Intake/Output from this shift: Total I/O In: 360 [P.O.:360] Out: 375 [Urine:375]  Physical Exam: Neck: no adenopathy, no carotid bruit, no JVD and supple, symmetrical, trachea midline Lungs: Decreased breath sound at left base with occasional rhonchi Heart: regular rate and rhythm, S1, S2 normal and Soft systolic murmur noted no S3 gallop Abdomen: soft, non-tender; bowel sounds normal; no masses,  no organomegaly Extremities: extremities normal, atraumatic, no cyanosis or edema  Lab Results:  Basename 07/20/11 0530 07/19/11 0528  WBC 8.8 9.3  HGB 12.8* 12.5*  PLT 155 146*    Basename 07/19/11 0528  NA 148*  K 3.2*  CL 109  CO2 30  GLUCOSE 81  BUN 22  CREATININE 1.80*    Basename 07/19/11 0528  TROPONINI 0.32*   Hepatic Function Panel No results found for this basename: PROT,ALBUMIN,AST,ALT,ALKPHOS,BILITOT,BILIDIR,IBILI in the last 72 hours No results found for this basename: CHOL in the last 72 hours No results found for this basename: PROTIME in the last 72 hours  Imaging: Imaging results have been reviewed and No results found.  Cardiac Studies:  Assessment/Plan:  Resolving Left lower lobe pneumonia  Acute pulmonary embolism  Bilateral DVT  Possible small non-Q-wave myocardial infarction  Coronary artery  disease  Hypertension  Diabetes matters  History of bilateral PE in the past  Chronic kidney disease stage III  Anemia  GERD  Chronic abdominal pain  History of atrial fibrillation in the past  Plan DC heparin Check chest x-ray in a.m. Home soon  LOS: 9 days    Milani Lowenstein N 07/20/2011, 12:40 PM

## 2011-07-20 NOTE — Progress Notes (Addendum)
0339, BP 181/79 HR 75 NSR on monitor. Pt sitting in bed, denies pain or discomfort. Pt asymptomatic. 0420 BP 171/76 HR 75 and NSR on monitor and RR 18 with unlabored breathing. Pt denies any chest pain or pain in general. Pt lying in bed and calm. Attending Thurnell Lose, MD paged at [phone]. RN told to page Sharyn Lull, MD. Sharyn Lull, MD paged at [phone] and [phone]. RN paged answering service for Sharyn Lull, MD at [phone]. 0522 RN made [facility] aware of  Increased BP, Orders given for amlodipine 5 mg daily via telephone read back. Will continue to monitor the pt. Wilson Singer, [place]

## 2011-07-21 ENCOUNTER — Inpatient Hospital Stay (HOSPITAL_COMMUNITY): Payer: Medicare Other

## 2011-07-21 LAB — GLUCOSE, CAPILLARY
Glucose-Capillary: 83 mg/dL (ref 70–99)
Glucose-Capillary: 118 mg/dL — ABNORMAL HIGH (ref 70–99)
Glucose-Capillary: 113 mg/dL — ABNORMAL HIGH (ref 70–99)

## 2011-07-21 LAB — CBC
HCT: 33.1 % — ABNORMAL LOW (ref 39.0–52.0)
MCH: 27.7 pg (ref 26.0–34.0)
MCV: 78.4 fL (ref 78.0–100.0)
Platelets: 162 10*3/uL (ref 150–400)
RBC: 4.22 MIL/uL (ref 4.22–5.81)
WBC: 7.6 10*3/uL (ref 4.0–10.5)

## 2011-07-21 MED ORDER — POLYVINYL ALCOHOL 1.4 % OP SOLN
1.0000 [drp] | OPHTHALMIC | Status: DC | PRN
  Administered 2011-07-21: 1 [drp] via OPHTHALMIC
  Filled 2011-07-21: qty 15

## 2011-07-21 MED ORDER — WARFARIN SODIUM 5 MG PO TABS
5.0000 mg | ORAL_TABLET | Freq: Once | ORAL | Status: AC
  Administered 2011-07-21: 5 mg via ORAL
  Filled 2011-07-21 (×2): qty 1

## 2011-07-21 NOTE — Progress Notes (Signed)
ANTICOAGULATION CONSULT NOTE - Follow Up Consult  Pharmacy Consult for Coumadin Indication: DVT and PE  No Known Allergies  Patient Measurements: Height: 5\' 8"  (172.7 cm) Weight: 166 lb 10.7 oz (75.6 kg) IBW/kg (Calculated) : 68.4  Heparin Dosing Weight:   Vital Signs: Temp: 98.9 F (37.2 C) (06/29 0444) Temp src: Oral (06/29 0444) BP: 126/72 mmHg (06/29 0901) Pulse Rate: 69  (06/29 0901)  Labs:  Basename 07/21/11 0525 07/20/11 0530 07/19/11 0528  HGB 11.7* 12.8* --  HCT 33.1* 36.3* 35.9*  PLT 162 155 146*  APTT -- -- --  LABPROT 28.2* 25.9* 21.2*  INR 2.59* 2.32* 1.80*  HEPARINUNFRC -- 0.30 0.29*  CREATININE -- -- 1.80*  CKTOTAL -- -- 48  CKMB -- -- 1.4  TROPONINI -- -- 0.32*    Estimated Creatinine Clearance: 32.2 ml/min (by C-G formula based on Cr of 1.8).   Medications:  Scheduled:    . amiodarone  200 mg Oral Daily  . amLODipine  5 mg Oral Daily  . antiseptic oral rinse  15 mL Mouth Rinse BID  . aspirin EC  81 mg Oral Daily  . atorvastatin  20 mg Oral q1800  . carvedilol  3.125 mg Oral BID WC  . feeding supplement  237 mL Oral BID  . fluticasone  1 puff Inhalation BID  . furosemide  40 mg Oral Daily  . glimepiride  1 mg Oral QAC breakfast  . insulin aspart  0-15 Units Subcutaneous TID WC  . isosorbide mononitrate  60 mg Oral Daily  . levothyroxine  50 mcg Oral QAC breakfast  . metoCLOPramide  5 mg Oral TID AC & HS  . pantoprazole  40 mg Oral Q1200  . piperacillin-tazobactam (ZOSYN)  IV  3.375 g Intravenous Q8H  . sodium chloride  3 mL Intravenous Q12H  . sucralfate  1 g Oral TID WC & HS  . Tamsulosin HCl  0.4 mg Oral QHS  . vancomycin  750 mg Intravenous Q24H  . warfarin  5 mg Oral ONCE-1800  . Warfarin - Pharmacist Dosing Inpatient   Does not apply q1800    Assessment: 78yo male with PE & DVT, heparin d/c'd yesterday with first tx INR due to epistaxis which is now resolved.  INR remain therapeutic today.  No other issues  Goal of Therapy:    INR 2-3 Monitor platelets by anticoagulation protocol: Yes   Plan:  1.  Coumadin 5mg  today 2.  Continue daily INR  Marisue Humble, PharmD Clinical Pharmacist [facility]

## 2011-07-21 NOTE — Progress Notes (Signed)
Subjective:  Patient denies any chest pain states breathing is improving day by day. No further epistaxis  Objective:  Vital Signs in the last 24 hours: Temp:  [98.5 F (36.9 C)-98.9 F (37.2 C)] 98.9 F (37.2 C) (06/29 0444) Pulse Rate:  [62-73] 69  (06/29 0901) Resp:  [18-20] 18  (06/29 0444) BP: (123-143)/(66-73) 126/72 mmHg (06/29 0901) SpO2:  [91 %-100 %] 91 % (06/29 0901) Weight:  [75.6 kg (166 lb 10.7 oz)] 75.6 kg (166 lb 10.7 oz) (06/29 1610)  Intake/Output from previous day: 06/28 0701 - 06/29 0700 In: 600 [P.O.:600] Out: 1125 [Urine:1125] Intake/Output from this shift: Total I/O In: -  Out: 200 [Urine:200]  Physical Exam: Neck: no adenopathy, no carotid bruit, no JVD and supple, symmetrical, trachea midline Lungs: Decreased breath sound at bases with occasional rhonchi Heart: regular rate and rhythm, S1, S2 normal, no murmur, click, rub or gallop Abdomen: soft, non-tender; bowel sounds normal; no masses,  no organomegaly Extremities: extremities normal, atraumatic, no cyanosis or edema  Lab Results:  Basename 07/21/11 0525 07/20/11 0530  WBC 7.6 8.8  HGB 11.7* 12.8*  PLT 162 155    Basename 07/19/11 0528  NA 148*  K 3.2*  CL 109  CO2 30  GLUCOSE 81  BUN 22  CREATININE 1.80*    Basename 07/19/11 0528  TROPONINI 0.32*   Hepatic Function Panel No results found for this basename: PROT,ALBUMIN,AST,ALT,ALKPHOS,BILITOT,BILIDIR,IBILI in the last 72 hours No results found for this basename: CHOL in the last 72 hours No results found for this basename: PROTIME in the last 72 hours  Imaging: Imaging results have been reviewed and Dg Chest 2 View  07/21/2011  *RADIOLOGY REPORT*  Clinical Data: Pneumonia  CHEST - 2 VIEW  Comparison: 07/17/2011  Findings: Improved aeration.  Residual patchy airspace opacities in basilar segments of the left lower lobe and milder patchy peripheral opacities in the right lower lobe.  No effusion.  Heart size upper limits normal.   Atheromatous aortic arch.  Regional bones unremarkable.  IMPRESSION: 1.  improved aeration with persistent bilateral lower lobe airspace opacities as above.  Original Report Authenticated By: Osa Craver, M.D.    Cardiac Studies:  Assessment/Plan:  Resolving bilateral pneumonia Acute pulmonary embolism  Bilateral DVT  Possible small non-Q-wave myocardial infarction  Coronary artery disease  Hypertension  Diabetes matters  History of bilateral PE in the past  Chronic kidney disease stage III  Anemia  GERD  Chronic abdominal pain  History of atrial fibrillation in the past  Plan Continue present management Home soon  LOS: 10 days    [name] N 07/21/2011, 12:50 PM

## 2011-07-21 NOTE — Progress Notes (Signed)
07/21/2011 1130  Nursing note Pt c/o dry eyes. States he uses artificial tears at  Home. Spoke with Dr. Sharyn Lull. Verbal orders received ok to order artificial tears prn for pt. Orders enacted. Will continue to monitor.  Takela Varden, [place]

## 2011-07-22 LAB — CBC
MCHC: 35.4 g/dL (ref 30.0–36.0)
Platelets: 186 10*3/uL (ref 150–400)
RDW: 15.8 % — ABNORMAL HIGH (ref 11.5–15.5)
WBC: 8.2 10*3/uL (ref 4.0–10.5)

## 2011-07-22 LAB — GLUCOSE, CAPILLARY
Glucose-Capillary: 75 mg/dL (ref 70–99)
Glucose-Capillary: 87 mg/dL (ref 70–99)
Glucose-Capillary: 134 mg/dL — ABNORMAL HIGH (ref 70–99)

## 2011-07-22 LAB — PROTIME-INR: INR: 2.63 — ABNORMAL HIGH (ref 0.00–1.49)

## 2011-07-22 MED ORDER — WARFARIN SODIUM 5 MG PO TABS
5.0000 mg | ORAL_TABLET | Freq: Every day | ORAL | Status: DC
  Administered 2011-07-22: 5 mg via ORAL
  Filled 2011-07-22 (×2): qty 1

## 2011-07-22 MED ORDER — MOXIFLOXACIN HCL 400 MG PO TABS
400.0000 mg | ORAL_TABLET | ORAL | Status: DC
  Administered 2011-07-22: 400 mg via ORAL
  Filled 2011-07-22 (×2): qty 1

## 2011-07-22 NOTE — Progress Notes (Signed)
ANTICOAGULATION CONSULT NOTE - Follow Up Consult  Pharmacy Consult for Coumadin Indication: DVT and PE  No Known Allergies  Patient Measurements: Height: 5\' 8"  (172.7 cm) Weight: 159 lb 9.6 oz (72.394 kg) IBW/kg (Calculated) : 68.4   Vital Signs: Temp: 97.9 F (36.6 C) (06/30 1330) Temp src: Oral (06/30 1330) BP: 136/71 mmHg (06/30 1330) Pulse Rate: 71  (06/30 1330)  Labs:  Basename 07/22/11 0930 07/22/11 0630 07/21/11 0525 07/20/11 0530  HGB -- 12.5* 11.7* --  HCT -- 35.3* 33.1* 36.3*  PLT -- 186 162 155  APTT -- -- -- --  LABPROT 28.5* -- 28.2* 25.9*  INR 2.63* -- 2.59* 2.32*  HEPARINUNFRC -- -- -- 0.30  CREATININE -- -- -- --  CKTOTAL -- -- -- --  CKMB -- -- -- --  TROPONINI -- -- -- --    Estimated Creatinine Clearance: 32.2 ml/min (by C-G formula based on Cr of 1.8).   Medications:  Scheduled:     . amiodarone  200 mg Oral Daily  . amLODipine  5 mg Oral Daily  . antiseptic oral rinse  15 mL Mouth Rinse BID  . aspirin EC  81 mg Oral Daily  . atorvastatin  20 mg Oral q1800  . carvedilol  3.125 mg Oral BID WC  . feeding supplement  237 mL Oral BID  . fluticasone  1 puff Inhalation BID  . furosemide  40 mg Oral Daily  . glimepiride  1 mg Oral QAC breakfast  . insulin aspart  0-15 Units Subcutaneous TID WC  . isosorbide mononitrate  60 mg Oral Daily  . levothyroxine  50 mcg Oral QAC breakfast  . metoCLOPramide  5 mg Oral TID AC & HS  . moxifloxacin  400 mg Oral Q24H  . pantoprazole  40 mg Oral Q1200  . sodium chloride  3 mL Intravenous Q12H  . sucralfate  1 g Oral TID WC & HS  . Tamsulosin HCl  0.4 mg Oral QHS  . warfarin  5 mg Oral ONCE-1800  . Warfarin - Pharmacist Dosing Inpatient   Does not apply q1800  . DISCONTD: piperacillin-tazobactam (ZOSYN)  IV  3.375 g Intravenous Q8H  . DISCONTD: vancomycin  750 mg Intravenous Q24H    Assessment: 78yo male with PE & DVT, continues on Coumadin with therapeutic INR.  No issues noted.  Goal of Therapy:    INR 2-3  Plan:  1.  Coumadin 5mg  PO daily.   2.  Continue daily INR  [employer], Pharm.D., BCPS Clinical Pharmacist Pager [phone] 07/22/2011 3:00 PM

## 2011-07-22 NOTE — Progress Notes (Signed)
Subjective:  Patient denies any chest pain or shortness of breath states breathing has improved. Don't feel like going home today possibly tomorrow  Objective:  Vital Signs in the last 24 hours: Temp:  [97.1 F (36.2 C)-97.8 F (36.6 C)] 97.1 F (36.2 C) (06/30 0527) Pulse Rate:  [65-73] 66  (06/30 0856) Resp:  [16-18] 16  (06/30 0527) BP: (126-163)/(65-80) 126/65 mmHg (06/30 0856) SpO2:  [92 %-94 %] 94 % (06/30 0931) Weight:  [72.394 kg (159 lb 9.6 oz)] 72.394 kg (159 lb 9.6 oz) (06/30 0527)  Intake/Output from previous day: 06/29 0701 - 06/30 0700 In: 420 [P.O.:420] Out: 1750 [Urine:1750] Intake/Output from this shift:    Physical Exam: Neck: no adenopathy, no carotid bruit, no JVD and supple, symmetrical, trachea midline Lungs: Decreased breath sound at bases Heart: regular rate and rhythm, S1, S2 normal, no murmur, click, rub or gallop Abdomen: soft, non-tender; bowel sounds normal; no masses,  no organomegaly Extremities: extremities normal, atraumatic, no cyanosis or edema  Lab Results:  Basename 07/22/11 0630 07/21/11 0525  WBC 8.2 7.6  HGB 12.5* 11.7*  PLT 186 162   No results found for this basename: NA:2,K:2,CL:2,CO2:2,GLUCOSE:2,BUN:2,CREATININE:2 in the last 72 hours No results found for this basename: TROPONINI:2,CK,MB:2 in the last 72 hours Hepatic Function Panel No results found for this basename: PROT,ALBUMIN,AST,ALT,ALKPHOS,BILITOT,BILIDIR,IBILI in the last 72 hours No results found for this basename: CHOL in the last 72 hours No results found for this basename: PROTIME in the last 72 hours  Imaging: Imaging results have been reviewed and Dg Chest 2 View  07/21/2011  *RADIOLOGY REPORT*  Clinical Data: Pneumonia  CHEST - 2 VIEW  Comparison: 07/17/2011  Findings: Improved aeration.  Residual patchy airspace opacities in basilar segments of the left lower lobe and milder patchy peripheral opacities in the right lower lobe.  No effusion.  Heart size upper  limits normal.  Atheromatous aortic arch.  Regional bones unremarkable.  IMPRESSION: 1.  improved aeration with persistent bilateral lower lobe airspace opacities as above.  Original Report Authenticated By: Osa Craver, M.D.    Cardiac Studies:  Assessment/Plan:  Resolving bilateral pneumonia  Acute pulmonary embolism  Bilateral DVT  Possible small non-Q-wave myocardial infarction  Coronary artery disease  Hypertension  Diabetes matters  History of bilateral PE in the past  Chronic kidney disease stage III  Anemia  GERD  Chronic abdominal pain  History of atrial fibrillation in the past  Plan DC IV antibiotics change it to by mouth Avelox Check chest x-ray in a.m.  LOS: 11 days    [name] N 07/22/2011, 10:43 AM

## 2011-07-23 ENCOUNTER — Inpatient Hospital Stay (HOSPITAL_COMMUNITY): Payer: Medicare Other

## 2011-07-23 LAB — GLUCOSE, CAPILLARY: Glucose-Capillary: 81 mg/dL (ref 70–99)

## 2011-07-23 LAB — PROTIME-INR
INR: 2.88 — ABNORMAL HIGH (ref 0.00–1.49)
Prothrombin Time: 30.6 seconds — ABNORMAL HIGH (ref 11.6–15.2)

## 2011-07-23 LAB — CBC
HCT: 34 % — ABNORMAL LOW (ref 39.0–52.0)
Hemoglobin: 11.9 g/dL — ABNORMAL LOW (ref 13.0–17.0)
MCHC: 35 g/dL (ref 30.0–36.0)
RBC: 4.3 MIL/uL (ref 4.22–5.81)
WBC: 7.8 10*3/uL (ref 4.0–10.5)

## 2011-07-23 LAB — CULTURE, BLOOD (ROUTINE X 2): Culture: NO GROWTH

## 2011-07-23 MED ORDER — WARFARIN SODIUM 4 MG PO TABS: 4.0000 mg | ORAL_TABLET | Freq: Every day | ORAL | Status: DC

## 2011-07-23 MED ORDER — ISOSORBIDE MONONITRATE ER 60 MG PO TB24: 60.0000 mg | ORAL_TABLET | Freq: Every day | ORAL | Status: DC

## 2011-07-23 MED ORDER — AMLODIPINE BESYLATE 5 MG PO TABS: 5.0000 mg | ORAL_TABLET | Freq: Every day | ORAL | Status: DC

## 2011-07-23 MED ORDER — MOXIFLOXACIN HCL 400 MG PO TABS: 400.0000 mg | ORAL_TABLET | ORAL | Status: AC

## 2011-07-23 NOTE — Progress Notes (Signed)
ANTICOAGULATION CONSULT NOTE - Follow Up Consult  Pharmacy Consult for Coumadin Indication: Bilateral DVT and PE  No Known Allergies  Patient Measurements: Height: 5\' 8"  (172.7 cm) Weight: 164 lb 6.4 oz (74.571 kg) IBW/kg (Calculated) : 68.4   Vital Signs: Temp: 98.3 F (36.8 C) (07/01 0512) Temp src: Oral (07/01 0512) BP: 139/70 mmHg (07/01 0512) Pulse Rate: 72  (07/01 0857)  Labs:  Basename 07/23/11 0625 07/22/11 0930 07/22/11 0630 07/21/11 0525  HGB 11.9* -- 12.5* --  HCT 34.0* -- 35.3* 33.1*  PLT 218 -- 186 162  APTT -- -- -- --  LABPROT 30.6* 28.5* -- 28.2*  INR 2.88* 2.63* -- 2.59*  HEPARINUNFRC -- -- -- --  CREATININE -- -- -- --  CKTOTAL -- -- -- --  CKMB -- -- -- --  TROPONINI -- -- -- --    Estimated Creatinine Clearance: 32.2 ml/min (by C-G formula based on Cr of 1.8).  Assessment: [name] on Coumadin for acute DVT/PE. INR (2.88) is therapeutic and continues to trend up with Coumadin 5mg  daily - will continue for now but patient may need 1-2 days of Coumadin 2.5mg  per week once discharged.  - H/H trended down, Plts improved - No significant bleeding reported  Goal of Therapy:  INR 2-3   Plan:  1. Continue Coumadin 5mg  daily 2. Follow-up AM INR  Cleon Dew [phone] 07/23/2011,11:25 AM

## 2011-07-23 NOTE — Discharge Summary (Signed)
  Discharge summary dictated on 07/23/2011 dictation number is [phone]

## 2011-07-23 NOTE — Discharge Instructions (Signed)
Chest Pain (Nonspecific) It is often hard to give a specific diagnosis for the cause of chest pain. There is always a chance that your pain could be related to something serious, such as a heart attack or a blood clot in the lungs. You need to follow up with your caregiver for further evaluation. CAUSES   Heartburn.   Pneumonia or bronchitis.   Anxiety or stress.   Inflammation around your heart (pericarditis) or lung (pleuritis or pleurisy).   A blood clot in the lung.   A collapsed lung (pneumothorax). It can develop suddenly on its own (spontaneous pneumothorax) or from injury (trauma) to the chest.   Shingles infection (herpes zoster virus).  The chest wall is composed of bones, muscles, and cartilage. Any of these can be the source of the pain.  The bones can be bruised by injury.   The muscles or cartilage can be strained by coughing or overwork.   The cartilage can be affected by inflammation and become sore (costochondritis).  DIAGNOSIS  Lab tests or other studies, such as X-rays, electrocardiography, stress testing, or cardiac imaging, may be needed to find the cause of your pain.  TREATMENT   Treatment depends on what may be causing your chest pain. Treatment may include:   Acid blockers for heartburn.   Anti-inflammatory medicine.   Pain medicine for inflammatory conditions.   Antibiotics if an infection is present.   You may be advised to change lifestyle habits. This includes stopping smoking and avoiding alcohol, caffeine, and chocolate.   You may be advised to keep your head raised (elevated) when sleeping. This reduces the chance of acid going backward from your stomach into your esophagus.   Most of the time, nonspecific chest pain will improve within 2 to 3 days with rest and mild pain medicine.  HOME CARE INSTRUCTIONS   If antibiotics were prescribed, take your antibiotics as directed. Finish them even if you start to feel better.   For the next few  days, avoid physical activities that bring on chest pain. Continue physical activities as directed.   Do not smoke.   Avoid drinking alcohol.   Only take over-the-counter or prescription medicine for pain, discomfort, or fever as directed by your caregiver.   Follow your caregiver's suggestions for further testing if your chest pain does not go away.   Keep any follow-up appointments you made. If you do not go to an appointment, you could develop lasting (chronic) problems with pain. If there is any problem keeping an appointment, you must call to reschedule.  SEEK MEDICAL CARE IF:   You think you are having problems from the medicine you are taking. Read your medicine instructions carefully.   Your chest pain does not go away, even after treatment.   You develop a rash with blisters on your chest.  SEEK IMMEDIATE MEDICAL CARE IF:   You have increased chest pain or pain that spreads to your arm, neck, jaw, back, or abdomen.   You develop shortness of breath, an increasing cough, or you are coughing up blood.   You have severe back or abdominal pain, feel nauseous, or vomit.   You develop severe weakness, fainting, or chills.   You have a fever.  THIS IS AN EMERGENCY. Do not wait to see if the pain will go away. Get medical help at once. Call your local emergency services (911 in [place] Do not drive yourself to the hospital. MAKE SURE YOU:   Understand these instructions.     Will watch your condition.   Will get help right away if you are not doing well or get worse.  Document Released: 10/18/2004 Document Revised: 12/28/2010 Document Reviewed: 08/14/2007 [facility] Patient Information 2012 [place], [place].Pulmonary Embolus A pulmonary (lung) embolus (PE) is a blood clot that has traveled from another place in the body to the lung. Most clots come from deep veins in the legs or pelvis. PE is a dangerous and potentially life-threatening condition that can be treated if  identified. CAUSES Blood clots form in a vein for different reasons. Usually several things cause blood clots. They include:  The flow of blood slows down.   The inside of the vein is damaged in some way.   The person has a condition that makes the blood clot more easily. These conditions may include:   Older age (especially over 68 years old).   Having a history of blood clots.   Having major or lengthy surgery. Hip surgery is particularly high-risk.   Breaking a hip or leg.   Sitting or lying still for a long time.   Cancer or cancer treatment.   Having a long, thin tube (catheter) placed inside a vein during a medical procedure.   Being overweight (obese).   Pregnancy and childbirth.   Medicines with estrogen.   Smoking.   Other circulation or heart problems.  SYMPTOMS  The symptoms of a PE usually start suddenly and include:  Shortness of breath.   Coughing.   Coughing up blood or blood-tinged mucus (phlegm).   Chest pain. Pain is often worse with deep breaths.   Rapid heartbeat.  DIAGNOSIS  If a PE is suspected, your caregiver will take a medical history and carry out a physical exam. Your caregiver will check for the risk factors listed above. Tests that also may be required include:  Blood tests, including studies of the clotting properties of your blood.   Imaging tests. Ultrasound, CT, MRI, and other tests can all be used to see if you have clots in your legs or lungs. If you have a clot in your legs and have breathing or chest problems, your caregiver may conclude that you have a clot in your lungs. Further lung tests may not be needed.   An EKG can look for heart strain from blood clots in the lungs.  PREVENTION   Exercise the legs regularly. Take a brisk 30 minute walk every day.   Maintain a weight that is appropriate for your height.   Avoid sitting or lying in bed for long periods of time without moving your legs.   Women, particularly those  over the age of 68, should consider the risks and benefits of taking estrogen medicines, including birth control pills.   Do not smoke, especially if you take estrogen medicines.   Long-distance travel can increase your risk. You should exercise your legs by walking or pumping the muscles every hour.   In hospital prevention:   Your caregiver will assess your need for preventive PE care (prophylaxis) when you are admitted to the hospital. If you are having surgery, your surgeon will assess you the day of or day after surgery.   Prevention may include medical and nonmedical measures.  TREATMENT   The most common treatment for a PE is blood thinning (anticoagulant) medicine, which reduces the blood's tendency to clot. Anticoagulants can stop new blood clots from forming and old ones from growing. They cannot dissolve existing clots. Your body does this by itself over time.  Anticoagulants can be given by mouth, by intravenous (IV) access, or by injection. Your caregiver will determine the best program for you.   Less commonly, clot-dissolving drugs (thrombolytics) are used to dissolve a PE. They carry a high risk of bleeding, so they are used mainly in severe cases.   Very rarely, a blood clot in the leg needs to be removed surgically.   If you are unable to take anticoagulants, your caregiver may arrange for you to have a filter placed in a main vein in your belly (abdomen). This filter prevents clots from traveling to your lungs.  HOME CARE INSTRUCTIONS   Take all medicines prescribed by your caregiver. Follow the directions carefully.   You will most likely continue taking anticoagulants after you leave the hospital. Your caregiver will advise you on the length of treatment (usually 3 to 6 months, sometimes for life).   Taking too much or too little of an anticoagulant is dangerous. While taking this type of medicine, you will need to have regular blood tests to be sure the dose is correct.  The dose can change for many reasons. It is critically important that you take this medicine exactly as prescribed and that you have blood tests exactly as directed.   Many foods can interfere with anticoagulants. These include foods high in vitamin K, such as spinach, kale, broccoli, cabbage, collard and turnip greens, Brussels sprouts, peas, cauliflower, seaweed, parsley, beef and pork liver, green tea, and soybean oil. Your caregiver should discuss limits on these foods with you or you should arrange a visit with a dietician to answer your questions.   Many medicines can interfere with anticoagulants. You must tell your caregiver about any and all medicines you take. This includesall vitamins and supplements. Be especially cautious with aspirin and anti-inflammatory medicines. Ask your caregiver before taking these.   Anticoagulants can have side effects, mostly excessive bruising or bleeding. You will need to hold pressure over cuts for longer than usual. Avoid alcoholic drinks or consume only very small amounts while taking this medicine.   If you are taking an anticoagulant:   Wear a medical alert bracelet.   Notify your dentist or other caregivers before procedures.   Avoid contact sports.   Ask your caregiver how soon you can go back to normal activities. Not being active can lead to new clots. Ask for a list of what you should and should not do.   Exercise your lower leg muscles. This is important while traveling.   You may need to wear compression stockings. These are tight elastic stockings that apply pressure to the lower legs. This can help keep the blood in the legs from clotting.   If you are a smoker, you should quit.   Learn as much as you can about pulmonary embolisms.  SEEK MEDICAL CARE IF:   You notice a rapid heartbeat.   You feel weaker or more tired than usual.   You feel faint.   You notice increased bruising.   Your symptoms are not getting better in the  time expected.   You are having side effects of medicine.   You have an oral temperature above 102 F (38.9 C).   You discover other family members with blood clots. This may require further testing for inherited diseases or conditions.  SEEK IMMEDIATE MEDICAL CARE IF:   You have chest pain.   You have trouble breathing.   You have new or increased swelling or pain in one leg.  You cough up blood.   You notice blood in vomit, in a bowel movement, or in urine.   You have an oral temperature above 102 F (38.9 C), not controlled by medicine.  You may have another PE. A blood clot in the lungs is a medical emergency. Call your local emergency services (911 in [place] to get to the nearest hospital or clinic. Do not drive yourself. MAKE SURE YOU:   Understand these instructions.   Will watch your condition.   Will get help right away if you are not doing well or get worse.  Document Released: 01/06/2000 Document Revised: 12/28/2010 Document Reviewed: 07/12/2008 [facility] Patient Information 2012 [place], [place].

## 2011-07-23 NOTE — Progress Notes (Signed)
Patient discharged  Home without taking his prescription, bother was called in to pick up prescription. Patient was not happy he was been discharged. Pt. Discharged 07/23/2011  7:36 PM Discharge instructions reviewed with patient/family. Patient/family verbalized understanding. All Rx's given. Questions answered as needed. Pt. Discharged to home with family/self.  Elease Hashimoto

## 2011-07-24 NOTE — Discharge Summary (Signed)
[name], [name] NO.:  [id number]  MEDICAL RECORD NO.:  [id number]  LOCATION:  2031                         FACILITY:  [facility]  PHYSICIAN:  Eduardo Osier. Sharyn Lull, M.D. DATE OF BIRTH:  [birth year]  DATE OF ADMISSION:  07/11/2011 DATE OF DISCHARGE:  07/23/2011                              DISCHARGE SUMMARY   ADMITTING DIAGNOSES: 1. Recurrent chest pain, rule out myocardial infarction. 2. Abdominal pain. 3. Constipation. 4. Coronary artery disease. 5. History of myocardial infarction x2 in the past. 6. Ischemic cardiomyopathy. 7. Compensated systolic heart failure. 8. History of status post paroxysmal atrial fibrillation. 9. History of bilateral pulmonary embolism. 10.History of peripheral vascular disease. 11.Hypertension. 12.Non-insulin-dependent diabetes mellitus. 13.Hypercholesteremia. 14.Chronic kidney disease, stage III. 15.Anemia. 16.Hypothyroidism.  DISCHARGE DIAGNOSES: 1. Status post acute bilateral pulmonary embolism. 2. Bilateral deep venous thrombosis. 3. Status post chest pain. 4. Probable very small non-Q-wave myocardial infarction. 5. Chronic abdominal pain, workup negative in the past. 6. Coronary artery disease. 7. History of myocardial infarction x2 in the past. 8. Ischemic cardiomyopathy. 9. Compensated systolic heart failure. 10.History of paroxysmal atrial fibrillation. 11.Hypertension. 12.Non-insulin-dependent diabetes mellitus. 13.Hypercholesteremia. 14.Chronic kidney disease, stage III. 15.Anemia. 16.Hypothyroidism.  DISCHARGE HOME MEDICATIONS: 1. Xanax 0.25 mg at nighttime as needed. 2. Amlodipine 5 mg 1 tablet daily. 3. Imdur 60 mg 1 tablet daily. 4. Avelox 400 mg 1 tablet daily for 5 days. 5. Carafate 1 g 4 times daily. 6. Warfarin 4 mg 1 tablet daily. 7. Albuterol inhaler 2 puffs every 6 hours as before. 8. Amiodarone 200 mg daily. 9. Amlodipine 5 mg daily as before. 10.Enteric-coated aspirin 81 mg 1 tablet  daily. 11.Carvedilol 3.125 mg 1 tablet twice daily. 12.Dexilant 60 mg daily. 13.Furosemide 40 mg 1 tablet daily. 14.Amaryl 1 mg daily. 15.Levothyroxine 50 mcg 1 tablet daily. 16.Amitiza 24 mcg 2 times daily with meals. 17.Nitrostat 0.4 mg sublingual use as directed. 18.Beclomethasone 1 puff twice daily as before. 19.Ranitidine 300 mg every evening. 20.Crestor 10 mg 1 tablet daily. 21.Flomax 0.4 mg daily at bedtime.  DIET:  Low-salt, low-cholesterol, 1800 calories ADA diet.  Follow up with me in 1 week.  Monitor blood pressure and blood sugar daily.  CONDITION AT DISCHARGE:  Stable.  We will check PT/INRs early next week.  BRIEF HISTORY AND HOSPITAL COURSE:  [name] is 78 year old male with past medical history significant for multiple medical problems, i.e., coronary artery disease; history of MI x2 in the past, status post PTCA and stenting to LAD, left circumflex and RCA in the past; ischemic cardiomyopathy; history of congestive heart failure secondary to systolic dysfunction; history of bilateral pulmonary embolism in the past; history of paroxysmal AFib; hypertension; non-insulin-dependent diabetes mellitus; hypercholesteremia; history of DVT; remote tobacco abuse; chronic kidney disease, stage III; hypothyroidism; history of pancreatitis in the past and GERD.  He was admitted by Dr. [name] because of recurrent substernal chest pain radiating to left shoulder, relieved with sublingual nitro.  Chest pain was associated with nausea. The patient also complains of constipation with abdominal pain.  No fever.  No cough.  The patient had cardiac cath in January 2013, which showed mild-to-moderate multivessel coronary artery disease with patent stents in LAD,  circumflex and RCA.  PAST MEDICAL HISTORY:  As above.  PHYSICAL EXAMINATION:  GENERAL:  He was alert, awake, and oriented x3. HEENT:  Normocephalic, atraumatic. NECK:  Supple.  No JVD.  No bruit. LUNGS:  Normal breath  sounds.  No respiratory distress.  No wheezing. CARDIOVASCULAR:  S1, S2 was normal.  There was soft systolic murmur and S3 gallop. ABDOMEN:  Soft.  There was mild epigastric tenderness.  No rebound or no guarding. EXTREMITIES:  There was no clubbing, cyanosis, or edema.  LABORATORY DATA:  His troponin-I was slightly elevated at 2.04.  Second set was 1.31.  His CK total was 140, MB 6.1.  Second set CK 119, MB 2.8. His sodium was 144, potassium 3.7, BUN 24, creatinine 1.67, glucose was in the range of 120s-160s.  Hemoglobin was 14.5, hematocrit 41, white count of 10.6.  His D-dimers were significantly elevated at 13.56. Ventilation-perfusion scan was positive for bilateral PE.  Chest x-ray showed left lower lung basilar atelectasis/opacity.  Repeat chest x-ray showed bilateral basilar opacities.  Blood cultures were negative. Repeat chest x-ray on July 21, 2011, showed improved aeration with persistent bilateral lower lobe airspace opacities.  Chest x-ray today showed patchy right lower lobe opacity and dense opacity at the lateral left base, not significantly changed from prior EKG.  His PT is 30.6, INR 2.88.  Hemoglobin today is 11.9, hematocrit 34, white count of 7.8. His blood sugar was 99.  BRIEF HOSPITAL COURSE:  The patient was admitted to telemetry unit.  The patient ruled in for small non-Q-wave myocardial infarction due to mildly elevated troponin-I.  Reviewing the prior angiogram, which was done approximately 6 months ago, which suggested patent stents.  His CPK- MB and CK where marginally elevated.  The patient was felt not the candidate for repeat catheterization.  The patient had significantly elevated D-dimer, subsequently underwent V/Q scan, which was strongly positive for bilateral PE.  The patient also had duplex ultrasound, which was positive for DVT.  The patient was continued on IV heparin, was started on Coumadin per pharmacy protocol.  The patient also was started  on broad-spectrum antibiotics for bilateral pneumonia.  The patient remained afebrile during the hospital stay.  His INR is in therapeutic range today.  The patient has been ambulating in the room without any problems.  The patient will be discharged home on above medications and will be followed up in my office in 1 week.  We will check his INRs early next week.     Eduardo Osier. Sharyn Lull, M.D.     [facility]  D:  07/23/2011  T:  07/23/2011  Job:  [id number]

## 2011-08-08 ENCOUNTER — Ambulatory Visit (INDEPENDENT_AMBULATORY_CARE_PROVIDER_SITE_OTHER): Payer: Medicare Other | Admitting: Internal Medicine

## 2011-08-08 ENCOUNTER — Encounter: Payer: Self-pay | Admitting: Internal Medicine

## 2011-08-08 ENCOUNTER — Other Ambulatory Visit (INDEPENDENT_AMBULATORY_CARE_PROVIDER_SITE_OTHER): Payer: Medicare Other

## 2011-08-08 VITALS — BP 130/68 | HR 81 | Temp 98.3°F | Resp 16 | Wt 168.5 lb

## 2011-08-08 DIAGNOSIS — N400 Enlarged prostate without lower urinary tract symptoms: Secondary | ICD-10-CM

## 2011-08-08 DIAGNOSIS — E119 Type 2 diabetes mellitus without complications: Secondary | ICD-10-CM

## 2011-08-08 DIAGNOSIS — I10 Essential (primary) hypertension: Secondary | ICD-10-CM

## 2011-08-08 DIAGNOSIS — N181 Chronic kidney disease, stage 1: Secondary | ICD-10-CM

## 2011-08-08 DIAGNOSIS — D638 Anemia in other chronic diseases classified elsewhere: Secondary | ICD-10-CM | POA: Insufficient documentation

## 2011-08-08 DIAGNOSIS — Z23 Encounter for immunization: Secondary | ICD-10-CM

## 2011-08-08 DIAGNOSIS — G8929 Other chronic pain: Secondary | ICD-10-CM

## 2011-08-08 DIAGNOSIS — D510 Vitamin B12 deficiency anemia due to intrinsic factor deficiency: Secondary | ICD-10-CM

## 2011-08-08 DIAGNOSIS — R1084 Generalized abdominal pain: Secondary | ICD-10-CM

## 2011-08-08 DIAGNOSIS — E876 Hypokalemia: Secondary | ICD-10-CM

## 2011-08-08 DIAGNOSIS — Z125 Encounter for screening for malignant neoplasm of prostate: Secondary | ICD-10-CM

## 2011-08-08 DIAGNOSIS — Z79899 Other long term (current) drug therapy: Secondary | ICD-10-CM

## 2011-08-08 LAB — CBC WITH DIFFERENTIAL/PLATELET
Basophils Absolute: 0 10*3/uL (ref 0.0–0.1)
Eosinophils Relative: 1.3 % (ref 0.0–5.0)
Hemoglobin: 13.2 g/dL (ref 13.0–17.0)
Lymphocytes Relative: 21 % (ref 12.0–46.0)
Monocytes Relative: 10.6 % (ref 3.0–12.0)
Neutro Abs: 3.9 10*3/uL (ref 1.4–7.7)
RBC: 4.7 Mil/uL (ref 4.22–5.81)
RDW: 17.1 % — ABNORMAL HIGH (ref 11.5–14.6)
WBC: 5.8 10*3/uL (ref 4.5–10.5)

## 2011-08-08 LAB — IBC PANEL: Saturation Ratios: 24.6 % (ref 20.0–50.0)

## 2011-08-08 LAB — COMPREHENSIVE METABOLIC PANEL
ALT: 29 U/L (ref 0–53)
CO2: 28 mEq/L (ref 19–32)
Calcium: 9.4 mg/dL (ref 8.4–10.5)
Chloride: 106 mEq/L (ref 96–112)
Creatinine, Ser: 1.9 mg/dL — ABNORMAL HIGH (ref 0.4–1.5)
GFR: 45.47 mL/min — ABNORMAL LOW (ref >60.00)
Total Protein: 6.8 g/dL (ref 6.0–8.3)

## 2011-08-08 LAB — URINALYSIS, ROUTINE W REFLEX MICROSCOPIC
Leukocytes, UA: NEGATIVE
Nitrite: NEGATIVE
Specific Gravity, Urine: 1.02 (ref 1.000–1.030)
pH: 6 (ref 5.0–8.0)

## 2011-08-08 LAB — AMYLASE: Amylase: 174 U/L — ABNORMAL HIGH (ref 27–131)

## 2011-08-08 LAB — VITAMIN B12: Vitamin B-12: 566 pg/mL (ref 211–911)

## 2011-08-08 LAB — PSA: PSA: 2.41 ng/mL (ref 0.10–4.00)

## 2011-08-08 NOTE — Patient Instructions (Signed)

## 2011-08-08 NOTE — Progress Notes (Signed)
Subjective:    Patient ID: [name], male    DOB: [birth year], 78 y.o.   MRN: [id number]  Abdominal Pain This is a chronic problem. The current episode started more than 1 month ago. The onset quality is gradual. The problem occurs intermittently. The most recent episode lasted 3 months. The problem has been unchanged. The pain is located in the generalized abdominal region. The pain is at a severity of 2/10. The pain is mild. The quality of the pain is sharp. The abdominal pain radiates to the suprapubic region. Associated symptoms include constipation, diarrhea and nausea. Pertinent negatives include no anorexia, arthralgias, belching, dysuria, fever, flatus, frequency, headaches, hematochezia, hematuria, melena, myalgias, vomiting or weight loss. The pain is aggravated by eating. The pain is relieved by nothing. He has tried antacids, H2 blockers and proton pump inhibitors (carafate) for the symptoms. Prior diagnostic workup includes upper endoscopy, lower endoscopy and GI consult (MRI/MRA of abd). His past medical history is significant for GERD.      Review of Systems  Constitutional: Negative for fever, chills, weight loss, diaphoresis, activity change, appetite change, fatigue and unexpected weight change.  HENT: Negative.   Eyes: Negative.   Respiratory: Negative for cough, chest tightness, shortness of breath, wheezing and stridor.   Cardiovascular: Negative for chest pain, palpitations and leg swelling.  Gastrointestinal: Positive for nausea, abdominal pain, diarrhea and constipation. Negative for vomiting, blood in stool, melena, hematochezia, abdominal distention, anal bleeding, rectal pain, anorexia and flatus.  Genitourinary: Negative for dysuria, urgency, frequency, hematuria, flank pain, decreased urine volume, discharge, penile swelling, scrotal swelling, enuresis, difficulty urinating, genital sores, penile pain and testicular pain.  Musculoskeletal: Negative for myalgias and  arthralgias.  Skin: Negative for color change, pallor, rash and wound.  Neurological: Negative for dizziness, tremors, seizures, syncope, facial asymmetry, speech difficulty, weakness, light-headedness, numbness and headaches.  Hematological: Negative for adenopathy. Does not bruise/bleed easily.  Psychiatric/Behavioral: Positive for confusion and decreased concentration. Negative for suicidal ideas, hallucinations, behavioral problems, disturbed wake/sleep cycle, self-injury, dysphoric mood and agitation. The patient is not nervous/anxious and is not hyperactive.        Objective:   Physical Exam  Vitals reviewed. Constitutional: He is oriented to person, place, and time. He appears well-developed and well-nourished.  Non-toxic appearance. He does not have a sickly appearance. He does not appear ill. No distress.  HENT:  Head: Normocephalic and atraumatic.  Mouth/Throat: Oropharynx is clear and moist. No oropharyngeal exudate.  Eyes: Conjunctivae are normal. Right eye exhibits no discharge. Left eye exhibits no discharge. No scleral icterus.  Neck: Normal range of motion. Neck supple. No JVD present. No tracheal deviation present. No thyromegaly present.  Cardiovascular: Normal rate, regular rhythm, normal heart sounds and intact distal pulses.  Exam reveals no gallop and no friction rub.   No murmur heard. Pulmonary/Chest: Effort normal and breath sounds normal. No stridor. No respiratory distress. He has no wheezes. He has no rales. He exhibits no tenderness.  Abdominal: Soft. Normal appearance and bowel sounds are normal. He exhibits no shifting dullness, no distension, no pulsatile liver, no fluid wave, no abdominal bruit, no ascites, no pulsatile midline mass and no mass. There is no hepatosplenomegaly. There is no tenderness. There is no rebound, no guarding and no CVA tenderness. No hernia. Hernia confirmed negative in the ventral area, confirmed negative in the right inguinal area and  confirmed negative in the left inguinal area.  Genitourinary: Rectum normal, testes normal and penis normal. Rectal exam shows no external  hemorrhoid, no internal hemorrhoid, no fissure, no mass, no tenderness and anal tone normal. Guaiac negative stool. Prostate is enlarged (2+ symmetrical BPH). Prostate is not tender. Right testis shows no mass, no swelling and no tenderness. Right testis is descended. Left testis shows no mass, no swelling and no tenderness. Left testis is descended. Circumcised. No penile erythema or penile tenderness. No discharge found.  Musculoskeletal: Normal range of motion. He exhibits edema (1+ edema in BLE). He exhibits no tenderness.  Lymphadenopathy:    He has no cervical adenopathy.       Right: No inguinal adenopathy present.       Left: No inguinal adenopathy present.  Neurological: He is oriented to person, place, and time.  Skin: Skin is warm and dry. No rash noted. He is not diaphoretic. No erythema. No pallor.  Psychiatric: He has a normal mood and affect. Judgment and thought content normal. His mood appears not anxious. His affect is not angry, not blunt, not labile and not inappropriate. His speech is delayed and tangential. His speech is not rapid and/or pressured and not slurred. He is slowed and withdrawn. He is not agitated, not aggressive, is not hyperactive, not actively hallucinating and not combative. Thought content is not paranoid and not delusional. Cognition and memory are impaired. He does not express impulsivity or inappropriate judgment. He does not exhibit a depressed mood. He expresses no homicidal and no suicidal ideation. He expresses no suicidal plans and no homicidal plans. He is communicative. He exhibits abnormal recent memory and abnormal remote memory. He is inattentive.     Lab Results  Component Value Date   WBC 7.8 07/23/2011   HGB 11.9* 07/23/2011   HCT 34.0* 07/23/2011   PLT 218 07/23/2011   GLUCOSE 81 07/19/2011   CHOL 182 07/12/2011     TRIG 88 07/12/2011   HDL 65 07/12/2011   LDLCALC 99 07/12/2011   ALT 30 07/11/2011   AST 36 07/11/2011   NA 148* 07/19/2011   K 3.2* 07/19/2011   CL 109 07/19/2011   CREATININE 1.80* 07/19/2011   BUN 22 07/19/2011   CO2 30 07/19/2011   TSH 1.483 02/16/2011   INR 2.88* 07/23/2011   HGBA1C 6.5* 07/11/2011       Assessment & Plan:

## 2011-08-10 DIAGNOSIS — G8929 Other chronic pain: Secondary | ICD-10-CM | POA: Insufficient documentation

## 2011-08-10 NOTE — Assessment & Plan Note (Signed)
I have reviewed the data in EPIC about his prior testing and I di not see anything that explains his pain, he has been seeing Dr. Elnoria Howard but I do not have those records, today I will recheck some labs to look for organ pathology and he will f/up with Dr. Elnoria Howard.

## 2011-08-10 NOTE — Assessment & Plan Note (Signed)
His recent a1c looks good 

## 2011-08-10 NOTE — Assessment & Plan Note (Signed)
I will recheck his K+ level today 

## 2011-08-10 NOTE — Assessment & Plan Note (Signed)
His BP is well controlled, I will check his lytes and renal function today 

## 2011-08-10 NOTE — Assessment & Plan Note (Signed)
I will check his PSA today 

## 2011-08-10 NOTE — Assessment & Plan Note (Signed)
I will check his CBC and will look at his vitamin levels as well

## 2011-09-19 ENCOUNTER — Ambulatory Visit (INDEPENDENT_AMBULATORY_CARE_PROVIDER_SITE_OTHER)
Admission: RE | Admit: 2011-09-19 | Discharge: 2011-09-19 | Disposition: A | Discharge status: Home / Self Care | Payer: Medicare Other | Source: Ambulatory Visit | Attending: Internal Medicine | Admitting: Internal Medicine

## 2011-09-19 ENCOUNTER — Ambulatory Visit (INDEPENDENT_AMBULATORY_CARE_PROVIDER_SITE_OTHER): Payer: Medicare Other | Admitting: Internal Medicine

## 2011-09-19 ENCOUNTER — Encounter: Payer: Self-pay | Admitting: Internal Medicine

## 2011-09-19 VITALS — BP 128/68 | HR 64 | Temp 97.7°F | Resp 16 | Wt 177.2 lb

## 2011-09-19 DIAGNOSIS — M25569 Pain in unspecified knee: Secondary | ICD-10-CM

## 2011-09-19 DIAGNOSIS — K219 Gastro-esophageal reflux disease without esophagitis: Secondary | ICD-10-CM | POA: Insufficient documentation

## 2011-09-19 DIAGNOSIS — M25562 Pain in left knee: Secondary | ICD-10-CM | POA: Insufficient documentation

## 2011-09-19 DIAGNOSIS — M1710 Unilateral primary osteoarthritis, unspecified knee: Secondary | ICD-10-CM

## 2011-09-19 DIAGNOSIS — E119 Type 2 diabetes mellitus without complications: Secondary | ICD-10-CM

## 2011-09-19 DIAGNOSIS — M25561 Pain in right knee: Secondary | ICD-10-CM

## 2011-09-19 MED ORDER — TRAMADOL HCL 50 MG PO TABS: 50.0000 mg | ORAL_TABLET | Freq: Three times a day (TID) | ORAL | Status: AC | PRN

## 2011-09-19 MED ORDER — FLAVOCOXID-CIT ZN BISGLCINATE 500-50 MG PO CAPS: 1.0000 | ORAL_CAPSULE | Freq: Two times a day (BID) | ORAL | Status: DC

## 2011-09-19 NOTE — Assessment & Plan Note (Signed)
He is doing well on his current regimen 

## 2011-09-19 NOTE — Assessment & Plan Note (Signed)
Will check plain films today to see the severity of his DJD and will start limbrel and tramadol for pain

## 2011-09-19 NOTE — Patient Instructions (Signed)
Osteoarthritis Osteoarthritis is the most common form of arthritis. It is redness, soreness, and swelling (inflammation) affecting the cartilage. Cartilage acts as a cushion, covering the ends of bones where they meet to form a joint. CAUSES  Over time, the cartilage begins to wear away. This causes bone to rub on bone. This produces pain and stiffness in the affected joints. Factors that contribute to this problem are:  Excessive body weight.   Age.   Overuse of joints.  SYMPTOMS   People with osteoarthritis usually experience joint pain, swelling, or stiffness.   Over time, the joint may lose its normal shape.   Small deposits of bone (osteophytes) may grow on the edges of the joint.   Bits of bone or cartilage can break off and float inside the joint space. This may cause more pain and damage.   Osteoarthritis can lead to depression, anxiety, feelings of helplessness, and limitations on daily activities.  The most commonly affected joints are in the:  Ends of the fingers.   Thumbs.   Neck.   Lower back.   Knees.   Hips.  DIAGNOSIS  Diagnosis is mostly based on your symptoms and exam. Tests may be helpful, including:  X-rays of the affected joint.   A computerized magnetic scan (MRI).   Blood tests to rule out other types of arthritis.   Joint fluid tests. This involves using a needle to draw fluid from the joint and examining the fluid under a microscope.  TREATMENT  Goals of treatment are to control pain, improve joint function, maintain a normal body weight, and maintain a healthy lifestyle. Treatment approaches may include:  A prescribed exercise program with rest and joint relief.   Weight control with nutritional education.   Pain relief techniques such as:   Properly applied heat and cold.   Electric pulses delivered to nerve endings under the skin (transcutaneous electrical nerve stimulation, TENS).   Massage.   Certain supplements. Ask your  caregiver before using any supplements, especially in combination with prescribed drugs.   Medicines to control pain, such as:   Acetaminophen.   Nonsteroidal anti-inflammatory drugs (NSAIDs), such as naproxen.   Narcotic or central-acting agents, such as tramadol. This drug carries a risk of addiction and is generally prescribed for short-term use.   Corticosteroids. These can be given orally or as injection. This is a short-term treatment, not recommended for routine use.   Surgery to reposition the bones and relieve pain (osteotomy) or to remove loose pieces of bone and cartilage. Joint replacement may be needed in advanced states of osteoarthritis.  HOME CARE INSTRUCTIONS  Your caregiver can recommend specific types of exercise. These may include:  Strengthening exercises. These are done to strengthen the muscles that support joints affected by arthritis. They can be performed with weights or with exercise bands to add resistance.   Aerobic activities. These are exercises, such as brisk walking or low-impact aerobics, that get your heart pumping. They can help keep your lungs and circulatory system in shape.   Range-of-motion activities. These keep your joints limber.   Balance and agility exercises. These help you maintain daily living skills.  Learning about your condition and being actively involved in your care will help improve the course of your osteoarthritis. SEEK MEDICAL CARE IF:   You feel hot or your skin turns red.   You develop a rash in addition to your joint pain.   You have an oral temperature above 102 F (38.9 C).  FOR   MORE INFORMATION  National Institute of Arthritis and Musculoskeletal and Skin Diseases: www.niams.nih.gov National Institute on Aging: www.nia.nih.gov American College of Rheumatology: www.rheumatology.org Document Released: 01/08/2005 Document Revised: 12/28/2010 Document Reviewed: 04/21/2009 [employer] Patient Information 2012 [employer]. 

## 2011-09-19 NOTE — Progress Notes (Signed)
Subjective:    Patient ID: [name], male    DOB: 1929/09/18, 78 y.o.   MRN: [id number]  Arthritis Presents for initial visit. The disease course has been worsening. He complains of pain and stiffness. He reports no joint swelling or joint warmth. Affected locations include the left knee and right knee. His pain is at a severity of 3/10. Pertinent negatives include no diarrhea, dry eyes, dry mouth, dysuria, fatigue, fever, pain at night, pain while resting, rash, Raynaud's syndrome, uveitis or weight loss. Past treatments include nothing. Factors aggravating his arthritis include climbing stairs.  Gastrophageal Reflux He complains of belching. He reports no abdominal pain, no chest pain, no choking, no coughing, no dysphagia, no early satiety, no globus sensation, no heartburn, no hoarse voice, no nausea, no sore throat, no stridor, no tooth decay, no water brash or no wheezing. This is a chronic problem. The current episode started more than 1 year ago. The problem occurs rarely. The problem has been unchanged. Nothing aggravates the symptoms. Pertinent negatives include no anemia, fatigue, melena, muscle weakness, orthopnea or weight loss. He has tried a PPI and a histamine-2 antagonist for the symptoms. The treatment provided significant relief. Past procedures include an EGD.      Review of Systems  Constitutional: Positive for appetite change (appetite has increased) and unexpected weight change (weight gain). Negative for fever, chills, weight loss, diaphoresis, activity change and fatigue.  HENT: Negative.  Negative for sore throat and hoarse voice.   Eyes: Negative.   Respiratory: Negative for cough, choking, chest tightness, shortness of breath, wheezing and stridor.   Cardiovascular: Negative for chest pain, palpitations and leg swelling.  Gastrointestinal: Positive for constipation. Negative for heartburn, dysphagia, nausea, vomiting, abdominal pain, diarrhea, blood in stool, melena,  abdominal distention, anal bleeding and rectal pain.  Genitourinary: Negative.  Negative for dysuria and difficulty urinating.  Musculoskeletal: Positive for arthralgias, arthritis and stiffness. Negative for myalgias, back pain, joint swelling, gait problem and muscle weakness.  Skin: Negative for color change, pallor, rash and wound.  Neurological: Negative.   Psychiatric/Behavioral: Positive for confusion and decreased concentration. Negative for suicidal ideas, hallucinations, behavioral problems, disturbed wake/sleep cycle, self-injury, dysphoric mood and agitation. The patient is not nervous/anxious and is not hyperactive.        Objective:   Physical Exam  Vitals reviewed. Constitutional: He is oriented to person, place, and time. He appears well-developed and well-nourished. No distress.  HENT:  Head: Normocephalic and atraumatic.  Mouth/Throat: Oropharynx is clear and moist. No oropharyngeal exudate.  Eyes: Conjunctivae are normal. Right eye exhibits no discharge. Left eye exhibits no discharge. No scleral icterus.  Neck: Normal range of motion. Neck supple. No JVD present. No tracheal deviation present. No thyromegaly present.  Cardiovascular: Normal rate, regular rhythm, normal heart sounds and intact distal pulses.  Exam reveals no gallop and no friction rub.   No murmur heard. Pulmonary/Chest: Effort normal and breath sounds normal. No stridor. No respiratory distress. He has no wheezes. He has no rales. He exhibits no tenderness.  Abdominal: Soft. Bowel sounds are normal. He exhibits no distension and no mass. There is no tenderness. There is no rebound and no guarding.  Musculoskeletal: Normal range of motion. He exhibits edema (trace edema in BLE). He exhibits no tenderness.       Right knee: He exhibits deformity (DJD changes). He exhibits normal range of motion, no swelling, no effusion, no ecchymosis, no laceration, no erythema, normal alignment, no LCL laxity, normal  patellar  mobility and no bony tenderness. no tenderness found.       Left knee: He exhibits deformity (DJD changes). He exhibits normal range of motion, no swelling, no effusion, no ecchymosis, no laceration, no erythema, normal alignment, no LCL laxity, normal patellar mobility, no bony tenderness, normal meniscus and no MCL laxity. no tenderness found.  Lymphadenopathy:    He has no cervical adenopathy.  Neurological: He is oriented to person, place, and time.  Skin: Skin is warm and dry. No rash noted. He is not diaphoretic. No erythema. No pallor.  Psychiatric: He has a normal mood and affect. Judgment and thought content normal. His speech is delayed and tangential. His speech is not rapid and/or pressured and not slurred. He is slowed and withdrawn. He is not agitated, not aggressive, is not hyperactive, not actively hallucinating and not combative. Cognition and memory are impaired. He is communicative. He exhibits abnormal recent memory and abnormal remote memory. He is inattentive.      Lab Results  Component Value Date   WBC 5.8 08/08/2011   HGB 13.2 08/08/2011   HCT 41.2 08/08/2011   PLT 171.0 08/08/2011   GLUCOSE 154* 08/08/2011   CHOL 182 07/12/2011   TRIG 88 07/12/2011   HDL 65 07/12/2011   LDLCALC 99 07/12/2011   ALT 29 08/08/2011   AST 29 08/08/2011   NA 141 08/08/2011   K 4.4 08/08/2011   CL 106 08/08/2011   CREATININE 1.9* 08/08/2011   BUN 14 08/08/2011   CO2 28 08/08/2011   TSH 1.483 02/16/2011   PSA 2.41 08/08/2011   INR 2.88* 07/23/2011   HGBA1C 6.5* 07/11/2011      Assessment & Plan:

## 2011-09-19 NOTE — Assessment & Plan Note (Signed)
Will start limbrel and tramadol

## 2011-12-13 ENCOUNTER — Ambulatory Visit: Payer: Medicare Other | Admitting: Internal Medicine

## 2011-12-13 DIAGNOSIS — Z0289 Encounter for other administrative examinations: Secondary | ICD-10-CM

## 2011-12-21 ENCOUNTER — Encounter: Payer: Self-pay | Admitting: Internal Medicine

## 2011-12-21 ENCOUNTER — Ambulatory Visit (INDEPENDENT_AMBULATORY_CARE_PROVIDER_SITE_OTHER): Payer: Medicare Other | Admitting: Internal Medicine

## 2011-12-21 ENCOUNTER — Other Ambulatory Visit (INDEPENDENT_AMBULATORY_CARE_PROVIDER_SITE_OTHER): Payer: Medicare Other

## 2011-12-21 VITALS — BP 130/64 | HR 75 | Temp 98.2°F | Resp 16 | Wt 170.0 lb

## 2011-12-21 DIAGNOSIS — E1129 Type 2 diabetes mellitus with other diabetic kidney complication: Secondary | ICD-10-CM

## 2011-12-21 DIAGNOSIS — K4091 Unilateral inguinal hernia, without obstruction or gangrene, recurrent: Secondary | ICD-10-CM

## 2011-12-21 DIAGNOSIS — E1165 Type 2 diabetes mellitus with hyperglycemia: Secondary | ICD-10-CM

## 2011-12-21 DIAGNOSIS — I10 Essential (primary) hypertension: Secondary | ICD-10-CM

## 2011-12-21 DIAGNOSIS — N181 Chronic kidney disease, stage 1: Secondary | ICD-10-CM

## 2011-12-21 LAB — CBC WITH DIFFERENTIAL/PLATELET
Basophils Absolute: 0 10*3/uL (ref 0.0–0.1)
Eosinophils Absolute: 0.1 10*3/uL (ref 0.0–0.7)
Lymphocytes Relative: 18.3 % (ref 12.0–46.0)
MCHC: 32.2 g/dL (ref 30.0–36.0)
Neutrophils Relative %: 72.9 % (ref 43.0–77.0)
RDW: 17.1 % — ABNORMAL HIGH (ref 11.5–14.6)

## 2011-12-21 LAB — COMPREHENSIVE METABOLIC PANEL
ALT: 40 U/L (ref 0–53)
AST: 40 U/L — ABNORMAL HIGH (ref 0–37)
Alkaline Phosphatase: 98 U/L (ref 39–117)
Calcium: 9.4 mg/dL (ref 8.4–10.5)
Chloride: 102 mEq/L (ref 96–112)
Creatinine, Ser: 1.5 mg/dL (ref 0.4–1.5)
Potassium: 3.8 mEq/L (ref 3.5–5.1)

## 2011-12-21 LAB — HM DIABETES EYE EXAM

## 2011-12-21 NOTE — Assessment & Plan Note (Signed)
I will recheck his renal function today and will address if needed

## 2011-12-21 NOTE — Assessment & Plan Note (Signed)
He has adequate BP control, I will check his lytes and renal function 

## 2011-12-21 NOTE — Assessment & Plan Note (Signed)
I will recheck his A1C and will monitor his renal function 

## 2011-12-21 NOTE — Progress Notes (Signed)
Subjective:    Patient ID: [name], male    DOB: 1929/07/02, 78 y.o.   MRN: [id number]  Abdominal Pain This is a recurrent problem. The current episode started more than 1 year ago. The onset quality is gradual. The problem occurs intermittently. The most recent episode lasted 2 months. The problem has been unchanged. The pain is located in the RLQ. The pain is at a severity of 1/10. The pain is mild. The quality of the pain is aching. The abdominal pain does not radiate. Pertinent negatives include no anorexia, arthralgias, belching, constipation, diarrhea, dysuria, fever, flatus, frequency, headaches, hematochezia, hematuria, melena, myalgias, nausea, vomiting or weight loss. The pain is aggravated by palpation. He has tried nothing for the symptoms. Prior diagnostic workup includes GI consult.      Review of Systems  Constitutional: Negative for fever, chills, weight loss, diaphoresis, activity change, appetite change, fatigue and unexpected weight change.  HENT: Negative.   Eyes: Negative.   Respiratory: Negative for cough, chest tightness, shortness of breath, wheezing and stridor.   Cardiovascular: Negative for chest pain, palpitations and leg swelling.  Gastrointestinal: Positive for abdominal pain. Negative for nausea, vomiting, diarrhea, constipation, blood in stool, melena, hematochezia, abdominal distention, anal bleeding, rectal pain, anorexia and flatus.  Genitourinary: Negative for dysuria, urgency, frequency, hematuria, flank pain, decreased urine volume, discharge, penile swelling, scrotal swelling, enuresis, difficulty urinating, genital sores, penile pain and testicular pain.  Musculoskeletal: Negative for myalgias, back pain, joint swelling, arthralgias and gait problem.  Skin: Negative.   Neurological: Negative for dizziness, tremors, seizures, syncope, facial asymmetry, speech difficulty, weakness, light-headedness, numbness and headaches.  Hematological: Negative for  adenopathy. Does not bruise/bleed easily.  Psychiatric/Behavioral: Negative.        Objective:   Physical Exam  Vitals reviewed. Constitutional: He is oriented to person, place, and time. He appears well-developed and well-nourished. No distress.  HENT:  Head: Normocephalic and atraumatic.  Mouth/Throat: Oropharynx is clear and moist. No oropharyngeal exudate.  Eyes: Conjunctivae normal are normal. Right eye exhibits no discharge. Left eye exhibits no discharge. No scleral icterus.  Neck: Normal range of motion. Neck supple. No JVD present. No tracheal deviation present. No thyromegaly present.  Cardiovascular: Normal rate, regular rhythm, normal heart sounds and intact distal pulses.  Exam reveals no gallop and no friction rub.   No murmur heard. Pulmonary/Chest: Effort normal and breath sounds normal. No stridor. No respiratory distress. He has no wheezes. He has no rales. He exhibits no tenderness.  Abdominal: Soft. Normal appearance and bowel sounds are normal. He exhibits no shifting dullness, no distension, no pulsatile liver, no fluid wave, no abdominal bruit, no ascites, no pulsatile midline mass and no mass. There is no hepatosplenomegaly, splenomegaly or hepatomegaly. There is no tenderness. There is no rebound, no guarding and no CVA tenderness. A hernia is present. Hernia confirmed positive in the right inguinal area. Hernia confirmed negative in the left inguinal area.    Genitourinary: Testes normal and penis normal. Right testis shows no mass and no swelling. Right testis is descended. Left testis shows no mass, no swelling and no tenderness. Left testis is descended. Circumcised. No penile erythema or penile tenderness. No discharge found.  Musculoskeletal: Normal range of motion. He exhibits edema (trace edema in BLE). He exhibits no tenderness.  Lymphadenopathy:    He has no cervical adenopathy.       Right: No inguinal adenopathy present.       Left: No inguinal adenopathy  present.  Neurological:  He is oriented to person, place, and time.  Skin: Skin is warm and dry. No rash noted. He is not diaphoretic. No erythema. No pallor.  Psychiatric: He has a normal mood and affect. His behavior is normal. Judgment and thought content normal.     Lab Results  Component Value Date   WBC 5.8 08/08/2011   HGB 13.2 08/08/2011   HCT 41.2 08/08/2011   PLT 171.0 08/08/2011   GLUCOSE 154* 08/08/2011   CHOL 182 07/12/2011   TRIG 88 07/12/2011   HDL 65 07/12/2011   LDLCALC 99 07/12/2011   ALT 29 08/08/2011   AST 29 08/08/2011   NA 141 08/08/2011   K 4.4 08/08/2011   CL 106 08/08/2011   CREATININE 1.9* 08/08/2011   BUN 14 08/08/2011   CO2 28 08/08/2011   TSH 1.483 02/16/2011   PSA 2.41 08/08/2011   INR 2.88* 07/23/2011   HGBA1C 6.5* 07/11/2011       Assessment & Plan:

## 2011-12-21 NOTE — Patient Instructions (Signed)

## 2011-12-21 NOTE — Assessment & Plan Note (Signed)
General surgery referral 

## 2012-01-01 ENCOUNTER — Ambulatory Visit (INDEPENDENT_AMBULATORY_CARE_PROVIDER_SITE_OTHER): Payer: Medicare Other | Admitting: Surgery

## 2012-01-17 ENCOUNTER — Encounter (HOSPITAL_COMMUNITY): Payer: Self-pay | Admitting: *Deleted

## 2012-01-17 ENCOUNTER — Emergency Department (HOSPITAL_COMMUNITY)
Admission: EM | Admit: 2012-01-17 | Discharge: 2012-01-17 | Disposition: A | Discharge status: Home / Self Care | Payer: Medicare Other | Attending: Emergency Medicine | Admitting: Emergency Medicine

## 2012-01-17 ENCOUNTER — Emergency Department (HOSPITAL_COMMUNITY): Payer: Medicare Other

## 2012-01-17 DIAGNOSIS — M549 Dorsalgia, unspecified: Secondary | ICD-10-CM | POA: Insufficient documentation

## 2012-01-17 DIAGNOSIS — K5900 Constipation, unspecified: Secondary | ICD-10-CM

## 2012-01-17 DIAGNOSIS — K4091 Unilateral inguinal hernia, without obstruction or gangrene, recurrent: Secondary | ICD-10-CM

## 2012-01-17 DIAGNOSIS — K219 Gastro-esophageal reflux disease without esophagitis: Secondary | ICD-10-CM | POA: Insufficient documentation

## 2012-01-17 DIAGNOSIS — K4090 Unilateral inguinal hernia, without obstruction or gangrene, not specified as recurrent: Secondary | ICD-10-CM | POA: Insufficient documentation

## 2012-01-17 DIAGNOSIS — E119 Type 2 diabetes mellitus without complications: Secondary | ICD-10-CM | POA: Insufficient documentation

## 2012-01-17 DIAGNOSIS — Z86711 Personal history of pulmonary embolism: Secondary | ICD-10-CM | POA: Insufficient documentation

## 2012-01-17 DIAGNOSIS — R0789 Other chest pain: Secondary | ICD-10-CM | POA: Insufficient documentation

## 2012-01-17 DIAGNOSIS — E785 Hyperlipidemia, unspecified: Secondary | ICD-10-CM | POA: Insufficient documentation

## 2012-01-17 DIAGNOSIS — J45909 Unspecified asthma, uncomplicated: Secondary | ICD-10-CM | POA: Insufficient documentation

## 2012-01-17 DIAGNOSIS — R112 Nausea with vomiting, unspecified: Secondary | ICD-10-CM

## 2012-01-17 DIAGNOSIS — Z87891 Personal history of nicotine dependence: Secondary | ICD-10-CM | POA: Insufficient documentation

## 2012-01-17 DIAGNOSIS — Z7901 Long term (current) use of anticoagulants: Secondary | ICD-10-CM | POA: Insufficient documentation

## 2012-01-17 DIAGNOSIS — G8929 Other chronic pain: Secondary | ICD-10-CM | POA: Insufficient documentation

## 2012-01-17 DIAGNOSIS — Z8701 Personal history of pneumonia (recurrent): Secondary | ICD-10-CM | POA: Insufficient documentation

## 2012-01-17 DIAGNOSIS — Z8739 Personal history of other diseases of the musculoskeletal system and connective tissue: Secondary | ICD-10-CM | POA: Insufficient documentation

## 2012-01-17 DIAGNOSIS — E039 Hypothyroidism, unspecified: Secondary | ICD-10-CM | POA: Insufficient documentation

## 2012-01-17 DIAGNOSIS — Z86718 Personal history of other venous thrombosis and embolism: Secondary | ICD-10-CM | POA: Insufficient documentation

## 2012-01-17 DIAGNOSIS — I252 Old myocardial infarction: Secondary | ICD-10-CM | POA: Insufficient documentation

## 2012-01-17 DIAGNOSIS — Z7982 Long term (current) use of aspirin: Secondary | ICD-10-CM | POA: Insufficient documentation

## 2012-01-17 DIAGNOSIS — Z79899 Other long term (current) drug therapy: Secondary | ICD-10-CM | POA: Insufficient documentation

## 2012-01-17 LAB — CBC WITH DIFFERENTIAL/PLATELET
Basophils Absolute: 0 10*3/uL (ref 0.0–0.1)
Basophils Relative: 0 % (ref 0–1)
Eosinophils Relative: 1 % (ref 0–5)
Lymphocytes Relative: 22 % (ref 12–46)
MCV: 79 fL (ref 78.0–100.0)
Neutro Abs: 3.7 10*3/uL (ref 1.7–7.7)
Platelets: 159 10*3/uL (ref 150–400)
RDW: 16.7 % — ABNORMAL HIGH (ref 11.5–15.5)
WBC: 5.4 10*3/uL (ref 4.0–10.5)

## 2012-01-17 LAB — PROTIME-INR
INR: 2.55 — ABNORMAL HIGH (ref 0.00–1.49)
Prothrombin Time: 26.2 seconds — ABNORMAL HIGH (ref 11.6–15.2)

## 2012-01-17 LAB — BASIC METABOLIC PANEL
CO2: 28 mEq/L (ref 19–32)
Calcium: 9 mg/dL (ref 8.4–10.5)
GFR calc Af Amer: 46 mL/min — ABNORMAL LOW (ref >90)
GFR calc non Af Amer: 40 mL/min — ABNORMAL LOW (ref >90)
Sodium: 138 mEq/L (ref 135–145)

## 2012-01-17 LAB — POCT I-STAT TROPONIN I

## 2012-01-17 MED ORDER — ONDANSETRON HCL 4 MG PO TABS: 4.0000 mg | ORAL_TABLET | Freq: Four times a day (QID) | ORAL | Status: DC

## 2012-01-17 MED ORDER — POLYETHYLENE GLYCOL 3350 17 GM/SCOOP PO POWD: 17.0000 g | Freq: Two times a day (BID) | ORAL | Status: DC

## 2012-01-17 MED ORDER — BISACODYL 5 MG PO TBEC
5.0000 mg | DELAYED_RELEASE_TABLET | Freq: Every day | ORAL | Status: DC | PRN
  Administered 2012-01-17: 5 mg via ORAL
  Filled 2012-01-17: qty 1

## 2012-01-17 MED ORDER — ONDANSETRON HCL 4 MG/2ML IJ SOLN
4.0000 mg | Freq: Once | INTRAMUSCULAR | Status: AC
  Administered 2012-01-17: 4 mg via INTRAVENOUS
  Filled 2012-01-17: qty 2

## 2012-01-17 NOTE — ED Provider Notes (Signed)
[name] is a 78 y.o. male who is here for evaluation of abdominal pain. That is ongoing, and has been seen by his PCP. He has been referred to a surgeon for evaluation of a right inguinal hernia. Patient reports that he is having trouble eating, but is able to eat soft foods. He has chest pain today that is vague and not troublesome to him. He denies fever, chills, nausea, or vomiting.  Exam; elderly man, who is alert, and cooperative. Heart regular rate and rhythm. No murmur. Lungs clear to auscultation. Abdomen normal bowel sounds, soft, nontender. No mass or hepatosplenomegaly. No palpable abdominal wall hernia. Groin-no hernia while at rest, supine. Penis and testicles are normal.   Results for orders placed during the hospital encounter of 01/17/12  CBC WITH DIFFERENTIAL      Component Value Range   WBC 5.4  4.0 - 10.5 K/uL   RBC 4.66  4.22 - 5.81 MIL/uL   Hemoglobin 12.6 (*) 13.0 - 17.0 g/dL   HCT 16.1 (*) 09.6 - 04.5 %   MCV 79.0  78.0 - 100.0 fL   MCH 27.0  26.0 - 34.0 pg   MCHC 34.2  30.0 - 36.0 g/dL   RDW 40.9 (*) 81.1 - 91.4 %   Platelets 159  150 - 400 K/uL   Neutrophils Relative 67  43 - 77 %   Neutro Abs 3.7  1.7 - 7.7 K/uL   Lymphocytes Relative 22  12 - 46 %   Lymphs Abs 1.2  0.7 - 4.0 K/uL   Monocytes Relative 10  3 - 12 %   Monocytes Absolute 0.5  0.1 - 1.0 K/uL   Eosinophils Relative 1  0 - 5 %   Eosinophils Absolute 0.0  0.0 - 0.7 K/uL   Basophils Relative 0  0 - 1 %   Basophils Absolute 0.0  0.0 - 0.1 K/uL  BASIC METABOLIC PANEL      Component Value Range   Sodium 138  135 - 145 mEq/L   Potassium 3.8  3.5 - 5.1 mEq/L   Chloride 102  96 - 112 mEq/L   CO2 28  19 - 32 mEq/L   Glucose, Bld 174 (*) 70 - 99 mg/dL   BUN 14  6 - 23 mg/dL   Creatinine, Ser 7.82 (*) 0.50 - 1.35 mg/dL   Calcium 9.0  8.4 - 95.6 mg/dL   GFR calc non Af Amer 40 (*) >90 mL/min   GFR calc Af Amer 46 (*) >90 mL/min  PROTIME-INR      Component Value Range   Prothrombin Time 26.2 (*)  11.6 - 15.2 seconds   INR 2.55 (*) 0.00 - 1.49  APTT      Component Value Range   aPTT 34  24 - 37 seconds  POCT I-STAT TROPONIN I      Component Value Range   Troponin i, poc 0.01  0.00 - 0.08 ng/mL   Comment 3           OCCULT BLOOD, POC DEVICE      Component Value Range   Fecal Occult Bld NEGATIVE  NEGATIVE    Dg Abd Acute W/chest  01/17/2012  *RADIOLOGY REPORT*  Clinical Data: Abdominal pain constipation.  Rule out free air.  ACUTE ABDOMEN SERIES (ABDOMEN 2 VIEW & CHEST 1 VIEW)  Comparison: Chest radiograph 07/23/2011 and abdomen pelvis CT 02/01/2011  Findings: Mild cardiomegaly is stable.  Coronary artery atherosclerotic calcification is visible.  Mediastinal and hilar contours  are stable.  Trachea is midline.  There is plate-like atelectasis at the lateral left lung base.  Otherwise, the lungs are clear.  There is no evidence of free intraperitoneal air.  The bowel gas pattern is nonobstructive.  There is a moderate amount of stool in the proximal colon and the rectum.  There is a convex right mild scoliotic curvature of the lumbar spine with prominent lateral osteophyte formation and disc space narrowing on the left at L3-L4.  IMPRESSION:  1.  Moderate amount of stool in the colon.  Nonobstructive bowel gas pattern. 2.  Negative for free intraperitoneal air. 3.  Stable cardiomegaly.  Mild left basilar atelectasis.   Original Report Authenticated By: Britta Mccreedy, M.D.         Medical screening examination/treatment/procedure(s) were conducted as a shared visit with non-physician practitioner(s) and myself.  I personally evaluated the patient during the encounter  Flint Melter, MD 01/18/12 1055

## 2012-01-17 NOTE — ED Notes (Signed)
C/o RLQ pain for a long time, currently being worked up for a hernia. Last night started having chest pressure, nausea, no emesis.  Per EMS - IV placed, no ASA or NTG given.

## 2012-01-17 NOTE — ED Notes (Signed)
Pt c/o RLQ pain then points to obvious right inguinal hernia. Stated "the doctors won't do anything". Also c/o nausea, CP started last night, now reports pain almost gone. Denies SOB, diaphoresis.

## 2012-01-17 NOTE — ED Notes (Signed)
Transported to X-Ray °

## 2012-01-17 NOTE — ED Provider Notes (Signed)
History     CSN: [id number]  Arrival date & time 01/17/12  1350   First MD Initiated Contact with Patient 01/17/12 1500      Chief Complaint  Patient presents with  . Abdominal Pain  . Chest Pain    (Consider location/radiation/quality/duration/timing/severity/associated sxs/prior treatment) HPI Comments: To send 78 year old male, who presents emergency department with chief complaint of abdominal pain and chest pain. He states that he has had abdominal pain for the past 6 months, and chest pain for the past 2-3 months. States that he has a hernia, for which she was told to eat smooth liquids, and that it would go away on its own. He states that it has increased in size, and is becoming more painful. He has an appointment with surgeon on January 11. Patient states that this morning around 3 AM, his pain became worse than usual. He states that his pain is better after vomiting. States that his pain is currently 1/10. He endorses associated nausea and vomiting. Patient denies shortness of breath, diarrhea, constipation, numbness or tingling of the extremities.  The history is provided by the patient. No language interpreter was used.    Past Medical History  Diagnosis Date  . GERD (gastroesophageal reflux disease)   . Hypothyroidism   . Chronic back pain   . Stroke   . Gout   . Pulmonary embolism, bilateral 2010  . Hyperlipemia   . Myocardial infarction 1987; 1989  . Anginal pain   . DVT, lower extremity 1980's    LLE  . Asthma     "touch"  . Pneumonia 1970's  . Diabetes mellitus     "on the borderline"  . Renal disorder ~ 03/2011    "wasn't functioning right"  . Arthritis     "mostly in my knees and shoulders"    Past Surgical History  Procedure Date  . Esophagogastroduodenoscopy 12/22/2010    Procedure: ESOPHAGOGASTRODUODENOSCOPY (EGD);  Surgeon: Theda Belfast;  Location: [facility];  Service: Endoscopy;  Laterality: N/A;  . Appendectomy 1950  . Cataract  extraction w/ intraocular lens  implant, bilateral 1990's  . Cardiac catheterization   . Coronary angioplasty with stent placement     "think I have 4 total"    Family History  Problem Relation Age of Onset  . Diabetes Mother   . Heart disease Father   . Lung cancer Brother   . Stroke Neg Hx   . Hypertension Neg Hx   . Hyperlipidemia Neg Hx   . Alcohol abuse Neg Hx     History  Substance Use Topics  . Smoking status: Former Smoker -- 3.0 packs/day for 6 years    Types: Cigarettes    Quit date: 01/23/1980  . Smokeless tobacco: Never Used  . Alcohol Use: No     Comment: 07/12/2011; "last drink of alcohol was ~ 1983"      Review of Systems  All other systems reviewed and are negative.    Allergies  Review of patient's allergies indicates no known allergies.  Home Medications   Current Outpatient Rx  Name  Route  Sig  Dispense  Refill  . ALBUTEROL SULFATE HFA 108 (90 BASE) MCG/ACT IN AERS   Inhalation   Inhale 2 puffs into the lungs every 6 (six) hours as needed. For shortness of breath         . ALPRAZOLAM 0.25 MG PO TABS   Oral   Take 0.25 mg by mouth at bedtime as needed. For sleep         .  AMIODARONE HCL 200 MG PO TABS   Oral   Take 200 mg by mouth daily.          Marland Kitchen AMLODIPINE BESYLATE 5 MG PO TABS   Oral   Take 5 mg by mouth daily.         . ASPIRIN 81 MG PO TBEC   Oral   Take 81 mg by mouth daily.         . BECLOMETHASONE DIPROPIONATE 80 MCG/ACT NA AERS   Nasal   Place 1 puff into the nose 2 (two) times daily.         Marland Kitchen CARVEDILOL 3.125 MG PO TABS   Oral   Take 3.125 mg by mouth 2 (two) times daily with a meal.         . ZYRTEC PO   Oral   Take 1 tablet by mouth daily as needed.          . DEXLANSOPRAZOLE 60 MG PO CPDR   Oral   Take 60 mg by mouth daily.          . FUROSEMIDE 40 MG PO TABS   Oral   Take 40 mg by mouth daily.         Marland Kitchen GLIMEPIRIDE 1 MG PO TABS   Oral   Take 1 mg by mouth daily before breakfast.           . ISOSORBIDE MONONITRATE ER 60 MG PO TB24   Oral   Take 60 mg by mouth daily.          Marland Kitchen LEVOTHYROXINE SODIUM 50 MCG PO TABS   Oral   Take 50 mcg by mouth daily.          . LUBIPROSTONE 24 MCG PO CAPS   Oral   Take 24 mcg by mouth 2 (two) times daily with a meal.         . NITROGLYCERIN 0.4 MG SL SUBL   Sublingual   Place 0.4 mg under the tongue every 5 (five) minutes as needed. For chest pain          . RANITIDINE HCL 300 MG PO CAPS   Oral   Take 300 mg by mouth every evening.         Marland Kitchen ROSUVASTATIN CALCIUM 10 MG PO TABS   Oral   Take 1 tablet (10 mg total) by mouth daily.   30 tablet   1   . TAMSULOSIN HCL 0.4 MG PO CAPS   Oral   Take 0.4 mg by mouth at bedtime.          . TRAMADOL HCL 50 MG PO TABS   Oral   Take 50 mg by mouth every 6 (six) hours as needed. For pain         . WARFARIN SODIUM 4 MG PO TABS   Oral   Take 1 tablet (4 mg total) by mouth daily at 6 PM.   35 tablet   3     BP 143/69  Pulse 70  Temp 98.8 F (37.1 C) (Oral)  Resp 18  SpO2 100%  Physical Exam  Nursing note and vitals reviewed. Constitutional: He is oriented to person, place, and time. He appears well-developed and well-nourished.  HENT:  Head: Normocephalic and atraumatic.  Right Ear: External ear normal.  Left Ear: External ear normal.  Nose: Nose normal.  Mouth/Throat: Oropharynx is clear and moist. No oropharyngeal exudate.  Eyes: Conjunctivae normal and EOM are normal. Pupils  are equal, round, and reactive to light. Right eye exhibits no discharge. Left eye exhibits no discharge. No scleral icterus.  Neck: Normal range of motion. Neck supple. No JVD present.  Cardiovascular: Normal rate, regular rhythm, normal heart sounds and intact distal pulses.  Exam reveals no gallop and no friction rub.   No murmur heard. Pulmonary/Chest: Effort normal and breath sounds normal. No respiratory distress. He has no wheezes. He has no rales. He exhibits no  tenderness.  Abdominal: Soft. Bowel sounds are normal. He exhibits no distension and no mass. There is tenderness. There is no rebound and no guarding.         Tender to palpation in the left lower quadrant. No right upper quadrant tenderness, no right lower quadrant tenderness, no rebound tenderness, no peritoneal signs.  Musculoskeletal: Normal range of motion. He exhibits no edema and no tenderness.  Neurological: He is alert and oriented to person, place, and time. He has normal reflexes.       CN 3-12 intact  Skin: Skin is warm and dry.  Psychiatric: He has a normal mood and affect. His behavior is normal. Judgment and thought content normal.    ED Course  Procedures (including critical care time)  Labs Reviewed - No data to display No results found. Results for orders placed during the hospital encounter of 01/17/12  CBC WITH DIFFERENTIAL      Component Value Range   WBC 5.4  4.0 - 10.5 K/uL   RBC 4.66  4.22 - 5.81 MIL/uL   Hemoglobin 12.6 (*) 13.0 - 17.0 g/dL   HCT 04.5 (*) 40.9 - 81.1 %   MCV 79.0  78.0 - 100.0 fL   MCH 27.0  26.0 - 34.0 pg   MCHC 34.2  30.0 - 36.0 g/dL   RDW 91.4 (*) 78.2 - 95.6 %   Platelets 159  150 - 400 K/uL   Neutrophils Relative 67  43 - 77 %   Neutro Abs 3.7  1.7 - 7.7 K/uL   Lymphocytes Relative 22  12 - 46 %   Lymphs Abs 1.2  0.7 - 4.0 K/uL   Monocytes Relative 10  3 - 12 %   Monocytes Absolute 0.5  0.1 - 1.0 K/uL   Eosinophils Relative 1  0 - 5 %   Eosinophils Absolute 0.0  0.0 - 0.7 K/uL   Basophils Relative 0  0 - 1 %   Basophils Absolute 0.0  0.0 - 0.1 K/uL  BASIC METABOLIC PANEL      Component Value Range   Sodium 138  135 - 145 mEq/L   Potassium 3.8  3.5 - 5.1 mEq/L   Chloride 102  96 - 112 mEq/L   CO2 28  19 - 32 mEq/L   Glucose, Bld 174 (*) 70 - 99 mg/dL   BUN 14  6 - 23 mg/dL   Creatinine, Ser 2.13 (*) 0.50 - 1.35 mg/dL   Calcium 9.0  8.4 - 08.6 mg/dL   GFR calc non Af Amer 40 (*) >90 mL/min   GFR calc Af Amer 46 (*) >90  mL/min  PROTIME-INR      Component Value Range   Prothrombin Time 26.2 (*) 11.6 - 15.2 seconds   INR 2.55 (*) 0.00 - 1.49  APTT      Component Value Range   aPTT 34  24 - 37 seconds  POCT I-STAT TROPONIN I      Component Value Range   Troponin i, poc 0.01  0.00 - 0.08 ng/mL   Comment 3            Dg Abd Acute W/chest  01/17/2012  *RADIOLOGY REPORT*  Clinical Data: Abdominal pain constipation.  Rule out free air.  ACUTE ABDOMEN SERIES (ABDOMEN 2 VIEW & CHEST 1 VIEW)  Comparison: Chest radiograph 07/23/2011 and abdomen pelvis CT 02/01/2011  Findings: Mild cardiomegaly is stable.  Coronary artery atherosclerotic calcification is visible.  Mediastinal and hilar contours are stable.  Trachea is midline.  There is plate-like atelectasis at the lateral left lung base.  Otherwise, the lungs are clear.  There is no evidence of free intraperitoneal air.  The bowel gas pattern is nonobstructive.  There is a moderate amount of stool in the proximal colon and the rectum.  There is a convex right mild scoliotic curvature of the lumbar spine with prominent lateral osteophyte formation and disc space narrowing on the left at L3-L4.  IMPRESSION:  1.  Moderate amount of stool in the colon.  Nonobstructive bowel gas pattern. 2.  Negative for free intraperitoneal air. 3.  Stable cardiomegaly.  Mild left basilar atelectasis.   Original Report Authenticated By: Britta Mccreedy, M.D.       1. Constipation   2. Inguinal hernia recurrent unilateral   3. Nausea and vomiting       MDM  78 year old male with complaint of nausea, vomiting, and constipation. Patient has been treated with fluids and Zofran.  3:53 PM Patient state that he is feeling better after receiving Zofran.  7:18 PM Patient tolerating PO.  This patient has been seen by and discussed with Dr. Effie Shy.  8:48 PM Will discharge the patient to home with primary care followup. Will give the patient prescription for Zofran and MiraLax. Return  precautions have been given. Patient understands and agrees with the plan. He is stable and ready for discharge.        Roxy Horseman, PA-C 01/17/12 2048

## 2012-01-28 ENCOUNTER — Ambulatory Visit (INDEPENDENT_AMBULATORY_CARE_PROVIDER_SITE_OTHER): Payer: Medicare Other | Admitting: Surgery

## 2012-01-28 ENCOUNTER — Encounter (INDEPENDENT_AMBULATORY_CARE_PROVIDER_SITE_OTHER): Payer: Self-pay

## 2012-01-28 ENCOUNTER — Encounter (INDEPENDENT_AMBULATORY_CARE_PROVIDER_SITE_OTHER): Payer: Self-pay | Admitting: Surgery

## 2012-01-28 VITALS — BP 152/80 | HR 60 | Temp 98.5°F | Resp 16 | Ht 68.0 in | Wt 176.8 lb

## 2012-01-28 DIAGNOSIS — K4020 Bilateral inguinal hernia, without obstruction or gangrene, not specified as recurrent: Secondary | ICD-10-CM

## 2012-01-28 DIAGNOSIS — Z860100 Personal history of colon polyps, unspecified: Secondary | ICD-10-CM

## 2012-01-28 DIAGNOSIS — K5900 Constipation, unspecified: Secondary | ICD-10-CM

## 2012-01-28 DIAGNOSIS — K222 Esophageal obstruction: Secondary | ICD-10-CM

## 2012-01-28 DIAGNOSIS — K5909 Other constipation: Secondary | ICD-10-CM

## 2012-01-28 DIAGNOSIS — Z7901 Long term (current) use of anticoagulants: Secondary | ICD-10-CM

## 2012-01-28 DIAGNOSIS — K449 Diaphragmatic hernia without obstruction or gangrene: Secondary | ICD-10-CM

## 2012-01-28 NOTE — Progress Notes (Signed)
Subjective:     Patient ID: [name], male   DOB: 08-23-1929, 79 y.o.   MRN: [id number]  HPI  [name]  Sep 19, 1929 [id number]  Patient Care Team: Etta Grandchild, MD as PCP - General (Internal Medicine) Robynn Pane, MD as Consulting Physician (Cardiology) Theda Belfast, MD as Consulting Physician (Gastroenterology)  This patient is a 79 y.o.male who presents today for surgical evaluation at the request of Dr. Yetta Barre.   Reason for evaluation: Right inguinal hernia.  Nausea vomiting abdominal pain.  Pleasant elderly gentleman.  Struggles with chronic constipation.  Has issues of heartburn and reflux.  Found to have mild esophageal stricture and sliding hiatal hernia by Dr. Audley Hose on endoscopy in 2011.  On Dexilant and Zantac.  Still complains of heartburn and reflux.  Known to have a hernia in his right groin.  Found to be much larger more recently.  LDL time.  Some soreness.  As noted emergency room.  Nausea vomiting abdominal pain.  Workup otherwise negative.  Found to have inguinal hernia on CT scan on the left side as well.Normally has a bowel movement twice a week.  Can walk about 20 minutes before stopping.  Occasionally gets tired but no exertional chest pain.  Appendectomy age 84 but no other abdominal surgeries.  No hernia surgeries.  On Flomax for some prostatism but no change in urination.  On chronic anticoagulation he thinks because he had a stroke.Very hard of hearing so the conversation took a while.  Used to be a heavy smoker but has been abstinent for 20 years.  Patient Active Problem List  Diagnosis  . Type II or unspecified type diabetes mellitus with renal manifestations, uncontrolled(250.42)  . HYPERTENSION, BENIGN SYSTEMIC  . CLAUDICATION, INTERMITTENT  . RHINITIS, ALLERGIC  . Chronic renal insufficiency, stage I  . BPH (benign prostatic hyperplasia)  . DJD (degenerative joint disease) of knee  . GERD (gastroesophageal reflux disease)  . Inguinal hernia  recurrent unilateral    Past Medical History  Diagnosis Date  . GERD (gastroesophageal reflux disease)   . Hypothyroidism   . Chronic back pain   . Stroke   . Gout   . Pulmonary embolism, bilateral 2010  . Hyperlipemia   . Myocardial infarction 1987; 1989  . Anginal pain   . DVT, lower extremity 1980's    LLE  . Asthma     "touch"  . Pneumonia 1970's  . Diabetes mellitus     "on the borderline"  . Renal disorder ~ 03/2011    "wasn't functioning right"  . Arthritis     "mostly in my knees and shoulders"    Past Surgical History  Procedure Date  . Esophagogastroduodenoscopy 12/22/2010    Procedure: ESOPHAGOGASTRODUODENOSCOPY (EGD);  Surgeon: Theda Belfast;  Location: [facility];  Service: Endoscopy;  Laterality: N/A;  . Appendectomy 1950  . Cataract extraction w/ intraocular lens  implant, bilateral 1990's  . Cardiac catheterization   . Coronary angioplasty with stent placement     "think I have 4 total"    History   Social History  . Marital Status: Divorced    Spouse Name: N/A    Number of Children: N/A  . Years of Education: N/A   Occupational History  . Not on file.   Social History Main Topics  . Smoking status: Former Smoker -- 3.0 packs/day for 6 years    Types: Cigarettes    Quit date: 01/23/1980  . Smokeless tobacco: Never Used  . Alcohol  Use: No     Comment: 07/12/2011; "last drink of alcohol was ~ 1983"  . Drug Use: No  . Sexually Active: No   Other Topics Concern  . Not on file   Social History Narrative  . No narrative on file    Family History  Problem Relation Age of Onset  . Diabetes Mother   . Heart disease Father   . Lung cancer Brother   . Stroke Neg Hx   . Hypertension Neg Hx   . Hyperlipidemia Neg Hx   . Alcohol abuse Neg Hx     Current Outpatient Prescriptions  Medication Sig Dispense Refill  . albuterol (PROVENTIL HFA;VENTOLIN HFA) 108 (90 BASE) MCG/ACT inhaler Inhale 2 puffs into the lungs every 6 (six) hours as  needed. For shortness of breath      . ALPRAZolam (XANAX) 0.25 MG tablet Take 0.25 mg by mouth at bedtime as needed. For sleep      . amiodarone (PACERONE) 200 MG tablet Take 200 mg by mouth daily.       Marland Kitchen amLODipine (NORVASC) 5 MG tablet Take 5 mg by mouth daily.      Marland Kitchen aspirin 81 MG EC tablet Take 81 mg by mouth daily.      . carvedilol (COREG) 3.125 MG tablet Take 3.125 mg by mouth 2 (two) times daily with a meal.      . Cetirizine HCl (ZYRTEC PO) Take 1 tablet by mouth daily as needed.       Marland Kitchen dexlansoprazole (DEXILANT) 60 MG capsule Take 60 mg by mouth daily.       . furosemide (LASIX) 40 MG tablet Take 40 mg by mouth daily.      Marland Kitchen glimepiride (AMARYL) 1 MG tablet Take 1 mg by mouth daily before breakfast.       . isosorbide mononitrate (IMDUR) 60 MG 24 hr tablet Take 60 mg by mouth daily.       Marland Kitchen levothyroxine (SYNTHROID, LEVOTHROID) 50 MCG tablet Take 50 mcg by mouth daily.       Marland Kitchen lubiprostone (AMITIZA) 24 MCG capsule Take 24 mcg by mouth 2 (two) times daily with a meal.      . ondansetron (ZOFRAN) 4 MG tablet Take 1 tablet (4 mg total) by mouth every 6 (six) hours.  12 tablet  0  . ranitidine (ZANTAC) 300 MG capsule Take 300 mg by mouth every evening.      . rosuvastatin (CRESTOR) 10 MG tablet Take 1 tablet (10 mg total) by mouth daily.  30 tablet  1  . Tamsulosin HCl (FLOMAX) 0.4 MG CAPS Take 0.4 mg by mouth at bedtime.       . traMADol (ULTRAM) 50 MG tablet Take 50 mg by mouth every 6 (six) hours as needed. For pain      . warfarin (COUMADIN) 4 MG tablet Take 1 tablet (4 mg total) by mouth daily at 6 PM.  35 tablet  3     No Known Allergies  BP 152/80  Pulse 60  Temp 98.5 F (36.9 C) (Temporal)  Resp 16  Ht 5\' 8"  (1.727 m)  Wt 176 lb 12.8 oz (80.196 kg)  BMI 26.88 kg/m2  Dg Abd Acute W/chest  01/17/2012  *RADIOLOGY REPORT*  Clinical Data: Abdominal pain constipation.  Rule out free air.  ACUTE ABDOMEN SERIES (ABDOMEN 2 VIEW & CHEST 1 VIEW)  Comparison: Chest radiograph  07/23/2011 and abdomen pelvis CT 02/01/2011  Findings: Mild cardiomegaly is stable.  Coronary artery  atherosclerotic calcification is visible.  Mediastinal and hilar contours are stable.  Trachea is midline.  There is plate-like atelectasis at the lateral left lung base.  Otherwise, the lungs are clear.  There is no evidence of free intraperitoneal air.  The bowel gas pattern is nonobstructive.  There is a moderate amount of stool in the proximal colon and the rectum.  There is a convex right mild scoliotic curvature of the lumbar spine with prominent lateral osteophyte formation and disc space narrowing on the left at L3-L4.  IMPRESSION:  1.  Moderate amount of stool in the colon.  Nonobstructive bowel gas pattern. 2.  Negative for free intraperitoneal air. 3.  Stable cardiomegaly.  Mild left basilar atelectasis.   Original Report Authenticated By: Britta Mccreedy, M.D.      Review of Systems  Constitutional: Negative for fever, chills and diaphoresis.  HENT: Positive for hearing loss. Negative for nosebleeds, sore throat, facial swelling, mouth sores, trouble swallowing and ear discharge.   Eyes: Negative for photophobia, discharge and visual disturbance.  Respiratory: Negative for choking, chest tightness, shortness of breath and stridor.   Cardiovascular: Negative for chest pain and palpitations.  Gastrointestinal: Negative for nausea, vomiting, abdominal pain, diarrhea, constipation, blood in stool, abdominal distention, anal bleeding and rectal pain.  Genitourinary: Negative for dysuria, urgency, difficulty urinating and testicular pain.  Musculoskeletal: Negative for myalgias, back pain, arthralgias and gait problem.  Skin: Negative for color change, pallor, rash and wound.  Neurological: Negative for dizziness, speech difficulty, weakness, numbness and headaches.  Hematological: Negative for adenopathy. Does not bruise/bleed easily.  Psychiatric/Behavioral: Negative for hallucinations, confusion  and agitation.       Objective:   Physical Exam  Constitutional: He is oriented to person, place, and time. He appears well-developed and well-nourished. No distress.       Walks around quite easily without difficulty.  HENT:  Head: Normocephalic.  Right Ear: Decreased hearing is noted.  Left Ear: Decreased hearing is noted.  Mouth/Throat: Oropharynx is clear and moist. No oropharyngeal exudate.  Eyes: Conjunctivae normal and EOM are normal. Pupils are equal, round, and reactive to light. No scleral icterus.  Neck: Normal range of motion. Neck supple. No tracheal deviation present.  Cardiovascular: Normal rate, regular rhythm and intact distal pulses.   Pulmonary/Chest: Effort normal and breath sounds normal. No respiratory distress.  Abdominal: Soft. Bowel sounds are normal. He exhibits no distension, no pulsatile liver and no ascites. There is no tenderness. There is no rigidity, no rebound, no guarding, no CVA tenderness and negative Murphy's sign. A hernia is present. Hernia confirmed positive in the right inguinal area and confirmed positive in the left inguinal area. Hernia confirmed negative in the ventral area.    Musculoskeletal: Normal range of motion. He exhibits no tenderness.  Lymphadenopathy:    He has no cervical adenopathy.       Right: No inguinal adenopathy present.       Left: No inguinal adenopathy present.  Neurological: He is alert and oriented to person, place, and time. No cranial nerve deficit. He exhibits normal muscle tone. Coordination normal.  Skin: Skin is warm and dry. No rash noted. He is not diaphoretic. No erythema. No pallor.  Psychiatric: He has a normal mood and affect. His behavior is normal. Judgment and thought content normal.       Assessment:     Bilateral inguinal hernias, right greater than left.  Larger since CT diagnosis Early 2013  Chronic constipation  Epigastric abdominal discomfort  with reflux and vomiting.  History of esophageal  stricture (mild) and small sliding hiatal hernia followed by Dr. Elnoria Howard up with gastroenterology.  Already on Dexilant and ranitidine.   Last seen 2011.    Plan:     His hernia is large enough that it could be causing intermittent obstruction.  Think he would benefit from repair.  I would fix both sides.    The anatomy & physiology of the abdominal wall and pelvic floor was discussed.  The pathophysiology of hernias in the inguinal and pelvic region was discussed.  Natural history risks such as progressive enlargement, pain, incarceration & strangulation was discussed.   Contributors to complications such as smoking, obesity, diabetes, prior surgery, etc were discussed.    I feel the risks of no intervention will lead to serious problems that outweigh the operative risks; therefore, I recommended surgery to reduce and repair the hernia.  I explained laparoscopic techniques with possible need for an open approach.  I noted usual use of mesh to patch and/or buttress hernia repair  Risks such as bleeding, infection, abscess, need for further treatment, heart attack, death, and other risks were discussed.  I noted a good likelihood this will help address the problem.   Goals of post-operative recovery were discussed as well.  Possibility that this will not correct all symptoms was explained.  I stressed the importance of low-impact activity, aggressive pain control, avoiding constipation, & not pushing through pain to minimize risk of post-operative chronic pain or injury. Possibility of reherniation was discussed.  We will work to minimize complications.     An educational handout further explaining the pathology & treatment options was given as well.  Questions were answered.  The patient expresses understanding & wishes to proceed with surgery.    He would need cardiac clearance first.  We have work to arrange an appointment for him to see Dr. Sharyn Lull to make sure it is safe to hold anticoagulation and  it is safe for him to have surgery done.  I strongly recommend he use MiraLAX twice a day to get his constipation under control.  I noted that constipation can make abdominal issues worse.  Control the constipation and see what symptoms improved.  If he has persistent nausea vomiting and reflux after having his constipation controlled and hernias fixed, consider reevaluation by his gastroenterologist, Dr. Elnoria Howard, to rule out delayed gastric emptying snce he is a diabetic with chronic constipation and worsening reflux.  See if more needs to be done.  I do not think it will come to a paraesophageal hiatal hernia repair.  I would definitely hold off and make sure everything else has been addressed first.

## 2012-01-28 NOTE — Patient Instructions (Addendum)
See the Handout(s) we gave you.  Consider surgery to repair hernias in both groins.  Please call our office at [phone] if you wish to schedule surgery or if you have further questions / concerns.   You require clearance from a cardiologist and to make sure it is safe enough to proceed with surgery.  Please contact Dr. Sharyn Lull to make sure an appointment has been made  Hernia A hernia occurs when an internal organ pushes out through a weak spot in the abdominal wall. Hernias most commonly occur in the groin and around the navel. Hernias often can be pushed back into place (reduced). Most hernias tend to get worse over time. Some abdominal hernias can get stuck in the opening (irreducible or incarcerated hernia) and cannot be reduced. An irreducible abdominal hernia which is tightly squeezed into the opening is at risk for impaired blood supply (strangulated hernia). A strangulated hernia is a medical emergency. Because of the risk for an irreducible or strangulated hernia, surgery may be recommended to repair a hernia. CAUSES   Heavy lifting.  Prolonged coughing.  Straining to have a bowel movement.  A cut (incision) made during an abdominal surgery. HOME CARE INSTRUCTIONS   Bed rest is not required. You may continue your normal activities.  Avoid lifting more than 10 pounds (4.5 kg) or straining.  Cough gently. If you are a smoker it is best to stop. Even the best hernia repair can break down with the continual strain of coughing. Even if you do not have your hernia repaired, a cough will continue to aggravate the problem.  Do not wear anything tight over your hernia. Do not try to keep it in with an outside bandage or truss. These can damage abdominal contents if they are trapped within the hernia sac.  Eat a normal diet.  Avoid constipation. Straining over long periods of time will increase hernia size and encourage breakdown of repairs. If you cannot do this with diet alone,  stool softeners may be used. SEEK IMMEDIATE MEDICAL CARE IF:   You have a fever.  You develop increasing abdominal pain.  You feel nauseous or vomit.  Your hernia is stuck outside the abdomen, looks discolored, feels hard, or is tender.  You have any changes in your bowel habits or in the hernia that are unusual for you.  You have increased pain or swelling around the hernia.  You cannot push the hernia back in place by applying gentle pressure while lying down. MAKE SURE YOU:   Understand these instructions.  Will watch your condition.  Will get help right away if you are not doing well or get worse. Document Released: 01/08/2005 Document Revised: 04/02/2011 Document Reviewed: 08/28/2007 [facility] Patient Information 2013 [place], [place].   Increase MiraLAX to twice a day.  Increase until you are having one bowel movement a day:  GETTING TO GOOD BOWEL HEALTH. Irregular bowel habits such as constipation and diarrhea can lead to many problems over time.  Having one soft bowel movement a day is the most important way to prevent further problems.  The anorectal canal is designed to handle stretching and feces to safely manage our ability to get rid of solid waste (feces, poop, stool) out of our body.  BUT, hard constipated stools can act like ripping concrete bricks and diarrhea can be a burning fire to this very sensitive area of our body, causing inflamed hemorrhoids, anal fissures, increasing risk is perirectal abscesses, abdominal pain/bloating, an making irritable bowel worse.  The goal: ONE SOFT BOWEL MOVEMENT A DAY!  To have soft, regular bowel movements:    Drink at least 8 tall glasses of water a day.     Take plenty of fiber.  Fiber is the undigested part of plant food that passes into the colon, acting s "natures broom" to encourage bowel motility and movement.  Fiber can absorb and hold large amounts of water. This results in a larger, bulkier stool, which is soft and  easier to pass. Work gradually over several weeks up to 6 servings a day of fiber (25g a day even more if needed) in the form of: o Vegetables -- Root (potatoes, carrots, turnips), leafy green (lettuce, salad greens, celery, spinach), or cooked high residue (cabbage, broccoli, etc) o Fruit -- Fresh (unpeeled skin & pulp), Dried (prunes, apricots, cherries, etc ),  or stewed ( applesauce)  o Whole grain breads, pasta, etc (whole wheat)  o Bran cereals    Bulking Agents -- This type of water-retaining fiber generally is easily obtained each day by one of the following:  o Psyllium bran -- The psyllium plant is remarkable because its ground seeds can retain so much water. This product is available as Metamucil, Konsyl, Effersyllium, Per Diem Fiber, or the less expensive generic preparation in drug and health food stores. Although labeled a laxative, it really is not a laxative.  o Methylcellulose -- This is another fiber derived from wood which also retains water. It is available as Citrucel. o Polyethylene Glycol - and "artificial" fiber commonly called Miralax or Glycolax.  It is helpful for people with gassy or bloated feelings with regular fiber o Flax Seed - a less gassy fiber than psyllium   No reading or other relaxing activity while on the toilet. If bowel movements take longer than 5 minutes, you are too constipated   AVOID CONSTIPATION.  High fiber and water intake usually takes care of this.  Sometimes a laxative is needed to stimulate more frequent bowel movements, but    Laxatives are not a good long-term solution as it can wear the colon out. o Osmotics (Milk of Magnesia, Fleets phosphosoda, Magnesium citrate, MiraLax, GoLytely) are safer than  o Stimulants (Senokot, Castor Oil, Dulcolax, Ex Lax)    o Do not take laxatives for more than 7days in a row.    IF SEVERELY CONSTIPATED, try a Bowel Retraining Program: o Do not use laxatives.  o Eat a diet high in roughage, such as bran cereals  and leafy vegetables.  o Drink six (6) ounces of prune or apricot juice each morning.  o Eat two (2) large servings of stewed fruit each day.  o Take one (1) heaping tablespoon of a psyllium-based bulking agent twice a day. Use sugar-free sweetener when possible to avoid excessive calories.  o Eat a normal breakfast.  o Set aside 15 minutes after breakfast to sit on the toilet, but do not strain to have a bowel movement.  o If you do not have a bowel movement by the third day, use an enema and repeat the above steps.    Controlling diarrhea o Switch to liquids and simpler foods for a few days to avoid stressing your intestines further. o Avoid dairy products (especially milk & ice cream) for a short time.  The intestines often can lose the ability to digest lactose when stressed. o Avoid foods that cause gassiness or bloating.  Typical foods include beans and other legumes, cabbage, broccoli, and dairy foods.  Every person has some sensitivity to other foods, so listen to our body and avoid those foods that trigger problems for you. o Adding fiber (Citrucel, Metamucil, psyllium, Miralax) gradually can help thicken stools by absorbing excess fluid and retrain the intestines to act more normally.  Slowly increase the dose over a few weeks.  Too much fiber too soon can backfire and cause cramping & bloating. o Probiotics (such as active yogurt, Align, etc) may help repopulate the intestines and colon with normal bacteria and calm down a sensitive digestive tract.  Most studies show it to be of mild help, though, and such products can be costly. o Medicines:   Bismuth subsalicylate (ex. Kayopectate, Pepto Bismol) every 30 minutes for up to 6 doses can help control diarrhea.  Avoid if pregnant.   Loperamide (Immodium) can slow down diarrhea.  Start with two tablets (4mg  total) first and then try one tablet every 6 hours.  Avoid if you are having fevers or severe pain.  If you are not better or start  feeling worse, stop all medicines and call your doctor for advice o Call your doctor if you are getting worse or not better.  Sometimes further testing (cultures, endoscopy, X-ray studies, bloodwork, etc) may be needed to help diagnose and treat the cause of the diarrhea.  If you are worsening, consider seeing her gastroenterologist, Dr. Elnoria Howard, to see if anything more aggressive needs to be done with treatment of your hiatal hernia/esophageal stricture/reflux disease. o

## 2012-01-30 ENCOUNTER — Other Ambulatory Visit (HOSPITAL_COMMUNITY): Payer: Self-pay | Admitting: Cardiology

## 2012-01-30 DIAGNOSIS — R079 Chest pain, unspecified: Secondary | ICD-10-CM

## 2012-02-06 ENCOUNTER — Other Ambulatory Visit: Payer: Self-pay

## 2012-02-06 ENCOUNTER — Encounter (HOSPITAL_COMMUNITY)
Admission: RE | Admit: 2012-02-06 | Discharge: 2012-02-06 | Disposition: A | Discharge status: Home / Self Care | Payer: Medicare Other | Source: Ambulatory Visit | Attending: Cardiology | Admitting: Cardiology

## 2012-02-06 DIAGNOSIS — I447 Left bundle-branch block, unspecified: Secondary | ICD-10-CM | POA: Insufficient documentation

## 2012-02-06 DIAGNOSIS — I259 Chronic ischemic heart disease, unspecified: Secondary | ICD-10-CM | POA: Insufficient documentation

## 2012-02-06 DIAGNOSIS — R079 Chest pain, unspecified: Secondary | ICD-10-CM

## 2012-02-06 MED ORDER — REGADENOSON 0.4 MG/5ML IV SOLN
0.4000 mg | Freq: Once | INTRAVENOUS | Status: AC
  Administered 2012-02-06: 0.4 mg via INTRAVENOUS

## 2012-02-06 MED ORDER — TECHNETIUM TC 99M SESTAMIBI GENERIC - CARDIOLITE
10.0000 | Freq: Once | INTRAVENOUS | Status: AC | PRN
  Administered 2012-02-06: 10 via INTRAVENOUS

## 2012-02-06 MED ORDER — TECHNETIUM TC 99M SESTAMIBI GENERIC - CARDIOLITE
30.0000 | Freq: Once | INTRAVENOUS | Status: AC | PRN
  Administered 2012-02-06: 30 via INTRAVENOUS

## 2012-02-06 MED ORDER — REGADENOSON 0.4 MG/5ML IV SOLN
INTRAVENOUS | Status: AC
  Filled 2012-02-06: qty 5

## 2012-02-26 ENCOUNTER — Telehealth (INDEPENDENT_AMBULATORY_CARE_PROVIDER_SITE_OTHER): Payer: Self-pay

## 2012-02-26 ENCOUNTER — Encounter (INDEPENDENT_AMBULATORY_CARE_PROVIDER_SITE_OTHER): Payer: Self-pay

## 2012-02-26 NOTE — Telephone Encounter (Signed)
[facility] notifying pt that we did receive his cardiac clearance from Dr Annitta Jersey office. I will have the clearance scanned into epic today and orders taken to surgery schedulers. I did advise pt that he will need to stop his Coumadin 5 days before surgery. If any questions the pt can call me back.

## 2012-03-24 ENCOUNTER — Encounter (HOSPITAL_COMMUNITY)
Admission: RE | Admit: 2012-03-24 | Discharge: 2012-03-24 | Disposition: A | Discharge status: Home / Self Care | Payer: Medicare Other | Source: Ambulatory Visit | Attending: Surgery | Admitting: Surgery

## 2012-03-24 ENCOUNTER — Encounter (HOSPITAL_COMMUNITY): Payer: Self-pay

## 2012-03-24 HISTORY — DX: Major depressive disorder, single episode, unspecified: F32.9

## 2012-03-24 HISTORY — DX: Unspecified glaucoma: H40.9

## 2012-03-24 HISTORY — DX: Essential (primary) hypertension: I10

## 2012-03-24 HISTORY — DX: Depression, unspecified: F32.A

## 2012-03-24 HISTORY — DX: Unspecified hearing loss, unspecified ear: H91.90

## 2012-03-24 HISTORY — DX: Anxiety disorder, unspecified: F41.9

## 2012-03-24 HISTORY — DX: Other pulmonary collapse: J98.19

## 2012-03-24 LAB — APTT: aPTT: 39 seconds — ABNORMAL HIGH (ref 24–37)

## 2012-03-24 LAB — BASIC METABOLIC PANEL
GFR calc Af Amer: 43 mL/min — ABNORMAL LOW (ref >90)
GFR calc non Af Amer: 37 mL/min — ABNORMAL LOW (ref >90)
Potassium: 3.9 mEq/L (ref 3.5–5.1)
Sodium: 140 mEq/L (ref 135–145)

## 2012-03-24 LAB — PROTIME-INR
INR: 2.63 — ABNORMAL HIGH (ref 0.00–1.49)
Prothrombin Time: 26.8 seconds — ABNORMAL HIGH (ref 11.6–15.2)

## 2012-03-24 LAB — SURGICAL PCR SCREEN
MRSA, PCR: NEGATIVE
Staphylococcus aureus: NEGATIVE

## 2012-03-24 LAB — CBC
Hemoglobin: 14.5 g/dL (ref 13.0–17.0)
MCHC: 34.8 g/dL (ref 30.0–36.0)
Platelets: 181 10*3/uL (ref 150–400)
RDW: 16.3 % — ABNORMAL HIGH (ref 11.5–15.5)

## 2012-03-24 NOTE — Patient Instructions (Signed)
[address] Currie Dennin  03/24/2012   Your procedure is scheduled on: 03/28/12  Report to [facility] at 0700 AM.  Call this number if you have problems the morning of surgery [phone]: [phone]   Remember: please bring inhaler   Do not eat food or drink liquids After Midnight.     Take these medicines the morning of surgery with A SIP OF WATER: crestor, albuterol if needed, amiodarone, amlodipine, carvedilol, dexilant, imdur, synthroid   Do not wear jewelry, make-up or nail polish.  Do not wear lotions, powders, or perfumes. You may wear deodorant.  Do not shave 48 hours prior to surgery. Men may shave face and neck.  Do not bring valuables to the hospital.  Contacts, dentures or bridgework may not be worn into surgery.  Leave suitcase in the car. After surgery it may be brought to your room.  For patients admitted to the hospital, checkout time is 11:00 AM the day of discharge.      Please read over the following fact sheets that you were given: MRSA Information.  Birdie Sons, RN  pre op nurse call if needed [phone]    FAILURE TO FOLLOW THESE INSTRUCTIONS MAY RESULT IN CANCELLATION OF YOUR SURGERY   Patient Signature: ___________________________________________

## 2012-03-24 NOTE — Progress Notes (Signed)
Office visit note 02/18/12 on chart, Chest x-ray 07/23/11 on EPIC, EKG 01/20/12 on EPIC. Pt c/o of chest pain: will repeat EKG

## 2012-03-26 NOTE — Progress Notes (Signed)
Coolidge Breeze who is patient's niece called back to verify what medications patient should take am of surgery and what medications patient is to stop.  I explained to her that Dr Michaell Cowing decides what medications are to be stopped prior to surgery .  Niece instructed to call office of Dr Michaell Cowing.  Instructed niece patient should take albuterol if needed and to bring Inhaler with him day of surgery, Amiodarone, Amlodipine, Carvedilol, Dexilant, Imdur and Synthroid am of surgery with sip of water.  Niece voiced understanding.  Niece also stated that patient has already stopped Coumadin.

## 2012-03-28 ENCOUNTER — Encounter (HOSPITAL_COMMUNITY): Payer: Self-pay | Admitting: Anesthesiology

## 2012-03-28 ENCOUNTER — Encounter (HOSPITAL_COMMUNITY): Payer: Self-pay | Admitting: *Deleted

## 2012-03-28 ENCOUNTER — Encounter (HOSPITAL_COMMUNITY)
Admission: RE | Disposition: A | Discharge status: Home / Self Care | Payer: Self-pay | Source: Ambulatory Visit | Attending: Surgery

## 2012-03-28 ENCOUNTER — Ambulatory Visit (HOSPITAL_COMMUNITY): Payer: Medicare Other | Admitting: Anesthesiology

## 2012-03-28 ENCOUNTER — Ambulatory Visit (HOSPITAL_COMMUNITY)
Admission: RE | Admit: 2012-03-28 | Discharge: 2012-03-28 | Disposition: A | Discharge status: Home / Self Care | Payer: Medicare Other | Source: Ambulatory Visit | Attending: Surgery | Admitting: Surgery

## 2012-03-28 DIAGNOSIS — Z86718 Personal history of other venous thrombosis and embolism: Secondary | ICD-10-CM | POA: Insufficient documentation

## 2012-03-28 DIAGNOSIS — I252 Old myocardial infarction: Secondary | ICD-10-CM | POA: Insufficient documentation

## 2012-03-28 DIAGNOSIS — Z01812 Encounter for preprocedural laboratory examination: Secondary | ICD-10-CM | POA: Insufficient documentation

## 2012-03-28 DIAGNOSIS — K4091 Unilateral inguinal hernia, without obstruction or gangrene, recurrent: Secondary | ICD-10-CM | POA: Insufficient documentation

## 2012-03-28 DIAGNOSIS — J45909 Unspecified asthma, uncomplicated: Secondary | ICD-10-CM | POA: Insufficient documentation

## 2012-03-28 DIAGNOSIS — I739 Peripheral vascular disease, unspecified: Secondary | ICD-10-CM | POA: Insufficient documentation

## 2012-03-28 DIAGNOSIS — N289 Disorder of kidney and ureter, unspecified: Secondary | ICD-10-CM | POA: Insufficient documentation

## 2012-03-28 DIAGNOSIS — K4090 Unilateral inguinal hernia, without obstruction or gangrene, not specified as recurrent: Secondary | ICD-10-CM | POA: Insufficient documentation

## 2012-03-28 DIAGNOSIS — Z01810 Encounter for preprocedural cardiovascular examination: Secondary | ICD-10-CM | POA: Insufficient documentation

## 2012-03-28 DIAGNOSIS — R112 Nausea with vomiting, unspecified: Secondary | ICD-10-CM | POA: Insufficient documentation

## 2012-03-28 DIAGNOSIS — K219 Gastro-esophageal reflux disease without esophagitis: Secondary | ICD-10-CM | POA: Insufficient documentation

## 2012-03-28 DIAGNOSIS — E119 Type 2 diabetes mellitus without complications: Secondary | ICD-10-CM | POA: Insufficient documentation

## 2012-03-28 DIAGNOSIS — E039 Hypothyroidism, unspecified: Secondary | ICD-10-CM | POA: Insufficient documentation

## 2012-03-28 DIAGNOSIS — K4020 Bilateral inguinal hernia, without obstruction or gangrene, not specified as recurrent: Secondary | ICD-10-CM | POA: Diagnosis present

## 2012-03-28 DIAGNOSIS — I10 Essential (primary) hypertension: Secondary | ICD-10-CM | POA: Insufficient documentation

## 2012-03-28 DIAGNOSIS — Z86711 Personal history of pulmonary embolism: Secondary | ICD-10-CM | POA: Insufficient documentation

## 2012-03-28 DIAGNOSIS — K5900 Constipation, unspecified: Secondary | ICD-10-CM | POA: Insufficient documentation

## 2012-03-28 HISTORY — PX: INSERTION OF MESH: SHX5868

## 2012-03-28 HISTORY — PX: INGUINAL HERNIA REPAIR: SHX194

## 2012-03-28 SURGERY — REPAIR, HERNIA, INGUINAL, BILATERAL, LAPAROSCOPIC
Anesthesia: General | Laterality: Bilateral | Wound class: Clean Contaminated

## 2012-03-28 MED ORDER — FENTANYL CITRATE 0.05 MG/ML IJ SOLN
INTRAMUSCULAR | Status: DC | PRN
  Administered 2012-03-28: 50 ug via INTRAVENOUS
  Administered 2012-03-28: 100 ug via INTRAVENOUS
  Administered 2012-03-28: 50 ug via INTRAVENOUS

## 2012-03-28 MED ORDER — CEFAZOLIN SODIUM-DEXTROSE 2-3 GM-% IV SOLR
INTRAVENOUS | Status: AC
  Filled 2012-03-28: qty 50

## 2012-03-28 MED ORDER — FENTANYL CITRATE 0.05 MG/ML IJ SOLN: 25.0000 ug | INTRAMUSCULAR | Status: DC | PRN

## 2012-03-28 MED ORDER — BUPIVACAINE-EPINEPHRINE 0.25% -1:200000 IJ SOLN
INTRAMUSCULAR | Status: AC
  Filled 2012-03-28: qty 1

## 2012-03-28 MED ORDER — ACETAMINOPHEN 325 MG PO TABS: 650.0000 mg | ORAL_TABLET | ORAL | Status: DC | PRN

## 2012-03-28 MED ORDER — CEFAZOLIN SODIUM-DEXTROSE 2-3 GM-% IV SOLR
2.0000 g | INTRAVENOUS | Status: AC
  Administered 2012-03-28: 2 g via INTRAVENOUS

## 2012-03-28 MED ORDER — BUPIVACAINE-EPINEPHRINE 0.25% -1:200000 IJ SOLN
INTRAMUSCULAR | Status: DC | PRN
  Administered 2012-03-28: 80 mL

## 2012-03-28 MED ORDER — GLYCOPYRROLATE 0.2 MG/ML IJ SOLN
INTRAMUSCULAR | Status: DC | PRN
  Administered 2012-03-28: .2 mg via INTRAVENOUS
  Administered 2012-03-28: .5 mg via INTRAVENOUS

## 2012-03-28 MED ORDER — LACTATED RINGERS IV SOLN
INTRAVENOUS | Status: DC | PRN
  Administered 2012-03-28: 09:00:00 via INTRAVENOUS

## 2012-03-28 MED ORDER — ACETAMINOPHEN 650 MG RE SUPP
650.0000 mg | RECTAL | Status: DC | PRN
  Filled 2012-03-28: qty 1

## 2012-03-28 MED ORDER — SODIUM CHLORIDE 0.9 % IV SOLN: 250.0000 mL | INTRAVENOUS | Status: DC | PRN

## 2012-03-28 MED ORDER — STERILE WATER FOR IRRIGATION IR SOLN
Status: DC | PRN
  Administered 2012-03-28: 1500 mL

## 2012-03-28 MED ORDER — 0.9 % SODIUM CHLORIDE (POUR BTL) OPTIME
TOPICAL | Status: DC | PRN
  Administered 2012-03-28: 1000 mL

## 2012-03-28 MED ORDER — BUPIVACAINE-EPINEPHRINE PF 0.25-1:200000 % IJ SOLN
INTRAMUSCULAR | Status: AC
  Filled 2012-03-28: qty 30

## 2012-03-28 MED ORDER — NEOSTIGMINE METHYLSULFATE 1 MG/ML IJ SOLN
INTRAMUSCULAR | Status: DC | PRN
  Administered 2012-03-28: 1 mg via INTRAVENOUS
  Administered 2012-03-28: 3.5 mg via INTRAVENOUS

## 2012-03-28 MED ORDER — ROCURONIUM BROMIDE 100 MG/10ML IV SOLN
INTRAVENOUS | Status: DC | PRN
  Administered 2012-03-28: 50 mg via INTRAVENOUS
  Administered 2012-03-28: 5 mg via INTRAVENOUS

## 2012-03-28 MED ORDER — LACTATED RINGERS IV SOLN: INTRAVENOUS | Status: DC

## 2012-03-28 MED ORDER — HYDROMORPHONE HCL PF 1 MG/ML IJ SOLN: 0.2500 mg | INTRAMUSCULAR | Status: DC | PRN

## 2012-03-28 MED ORDER — ACETAMINOPHEN 10 MG/ML IV SOLN
INTRAVENOUS | Status: AC
  Filled 2012-03-28: qty 100

## 2012-03-28 MED ORDER — SODIUM CHLORIDE 0.9 % IJ SOLN: 3.0000 mL | INTRAMUSCULAR | Status: DC | PRN

## 2012-03-28 MED ORDER — OXYCODONE HCL 5 MG PO TABS: 5.0000 mg | ORAL_TABLET | ORAL | Status: DC | PRN

## 2012-03-28 MED ORDER — ONDANSETRON HCL 4 MG/2ML IJ SOLN: 4.0000 mg | Freq: Four times a day (QID) | INTRAMUSCULAR | Status: DC | PRN

## 2012-03-28 MED ORDER — LIDOCAINE HCL (PF) 2 % IJ SOLN
INTRAMUSCULAR | Status: DC | PRN
  Administered 2012-03-28: 100 mg

## 2012-03-28 MED ORDER — SODIUM CHLORIDE 0.9 % IJ SOLN: 3.0000 mL | Freq: Two times a day (BID) | INTRAMUSCULAR | Status: DC

## 2012-03-28 MED ORDER — PROPOFOL 10 MG/ML IV BOLUS
INTRAVENOUS | Status: DC | PRN
  Administered 2012-03-28: 100 mg via INTRAVENOUS

## 2012-03-28 MED ORDER — ACETAMINOPHEN 10 MG/ML IV SOLN
INTRAVENOUS | Status: DC | PRN
  Administered 2012-03-28: 1000 mg via INTRAVENOUS

## 2012-03-28 MED ORDER — ONDANSETRON HCL 4 MG/2ML IJ SOLN
INTRAMUSCULAR | Status: DC | PRN
  Administered 2012-03-28: 4 mg via INTRAVENOUS

## 2012-03-28 SURGICAL SUPPLY — 36 items
CANISTER SUCTION 2500CC (MISCELLANEOUS) ×2 IMPLANT
CHLORAPREP W/TINT 26ML (MISCELLANEOUS) ×2 IMPLANT
CLOTH BEACON ORANGE TIMEOUT ST (SAFETY) ×2 IMPLANT
DECANTER SPIKE VIAL GLASS SM (MISCELLANEOUS) ×2 IMPLANT
DRAPE LAPAROSCOPIC ABDOMINAL (DRAPES) ×2 IMPLANT
DRAPE UTILITY W/TAPE 26X15 (DRAPES) ×2 IMPLANT
DRAPE UTILITY XL STRL (DRAPES) ×2 IMPLANT
DRAPE WARM FLUID 44X44 (DRAPE) ×2 IMPLANT
DRSG TEGADERM 2-3/8X2-3/4 SM (GAUZE/BANDAGES/DRESSINGS) ×4 IMPLANT
DRSG TEGADERM 4X4.75 (GAUZE/BANDAGES/DRESSINGS) ×4 IMPLANT
ELECT REM PT RETURN 9FT ADLT (ELECTROSURGICAL) ×2
ELECTRODE REM PT RTRN 9FT ADLT (ELECTROSURGICAL) ×1 IMPLANT
GAUZE SPONGE 2X2 8PLY STRL LF (GAUZE/BANDAGES/DRESSINGS) ×1 IMPLANT
GLOVE BIOGEL PI IND STRL 7.0 (GLOVE) ×1 IMPLANT
GLOVE BIOGEL PI INDICATOR 7.0 (GLOVE) ×1
GLOVE ECLIPSE 8.0 STRL XLNG CF (GLOVE) ×2 IMPLANT
GLOVE INDICATOR 8.0 STRL GRN (GLOVE) ×2 IMPLANT
GOWN STRL NON-REIN LRG LVL3 (GOWN DISPOSABLE) ×2 IMPLANT
GOWN STRL REIN XL XLG (GOWN DISPOSABLE) ×4 IMPLANT
IV LACTATED RINGERS 1000ML (IV SOLUTION) IMPLANT
KIT BASIN OR (CUSTOM PROCEDURE TRAY) ×2 IMPLANT
MESH ULTRAPRO 6X6 15CM15CM (Mesh General) ×4 IMPLANT
SCISSORS LAP 5X35 DISP (ENDOMECHANICALS) ×2 IMPLANT
SET IRRIG TUBING LAPAROSCOPIC (IRRIGATION / IRRIGATOR) IMPLANT
SLEEVE XCEL OPT CAN 5 100 (ENDOMECHANICALS) ×2 IMPLANT
SPONGE GAUZE 2X2 STER 10/PKG (GAUZE/BANDAGES/DRESSINGS) ×1
SUT MNCRL AB 4-0 PS2 18 (SUTURE) ×2 IMPLANT
SUT VIC AB 3-0 SH 27 (SUTURE) ×1
SUT VIC AB 3-0 SH 27XBRD (SUTURE) ×1 IMPLANT
SUT VICRYL 0 UR6 27IN ABS (SUTURE) ×2 IMPLANT
TACKER 5MM HERNIA 3.5CML NAB (ENDOMECHANICALS) IMPLANT
TOWEL OR 17X26 10 PK STRL BLUE (TOWEL DISPOSABLE) ×2 IMPLANT
TRAY LAP CHOLE (CUSTOM PROCEDURE TRAY) ×2 IMPLANT
TROCAR XCEL BLUNT TIP 100MML (ENDOMECHANICALS) ×2 IMPLANT
TROCAR Z-THREAD FIOS 5X100MM (TROCAR) ×2 IMPLANT
TUBING INSUFFLATION 10FT LAP (TUBING) ×2 IMPLANT

## 2012-03-28 NOTE — Anesthesia Preprocedure Evaluation (Addendum)
Anesthesia Evaluation  Patient identified by MRN, date of birth, ID band Patient awake    Reviewed: Allergy & Precautions, H&P , NPO status , Patient's Chart, lab work & pertinent test results, reviewed documented beta blocker date and time   Airway Mallampati: II TM Distance: >3 FB Neck ROM: full    Dental  (+) Poor Dentition and Dental Advisory Given No intact tooth left:   Pulmonary asthma ,  PE bilateral 2010 breath sounds clear to auscultation  Pulmonary exam normal       Cardiovascular Exercise Tolerance: Poor hypertension, Pt. on home beta blockers + Past MI and + Peripheral Vascular Disease Rhythm:regular Rate:Normal  MI 1987, 1989.  Intermittent claudication. On coumadin LBBB   Neuro/Psych Anxiety Depression glaucoma CVA negative neurological ROS  negative psych ROS   GI/Hepatic negative GI ROS, Neg liver ROS, GERD-  Medicated and Controlled,  Endo/Other  diabetes, Well Controlled, Type 2, Oral Hypoglycemic AgentsHypothyroidism   Renal/GU Renal InsufficiencyRenal diseaseStage 1 renal insufficiency 3/14.  CRT 1.67  negative genitourinary   Musculoskeletal   Abdominal   Peds  Hematology negative hematology ROS (+)   Anesthesia Other Findings   Reproductive/Obstetrics negative OB ROS                          Anesthesia Physical Anesthesia Plan  ASA: III  Anesthesia Plan: General   Post-op Pain Management:    Induction: Intravenous  Airway Management Planned: Oral ETT  Additional Equipment:   Intra-op Plan:   Post-operative Plan: Extubation in OR  Informed Consent: I have reviewed the patients History and Physical, chart, labs and discussed the procedure including the risks, benefits and alternatives for the proposed anesthesia with the patient or authorized representative who has indicated his/her understanding and acceptance.   Dental Advisory Given  Plan Discussed with:  CRNA and Surgeon  Anesthesia Plan Comments:         Anesthesia Quick Evaluation

## 2012-03-28 NOTE — Progress Notes (Signed)
Pt's daughter, Dortha Schwalbe, is staying with pt until Monday. She is instructed in foley catheter care and removal of catheter early Monday AM so that family may contact Dr Michaell Cowing if pt is unable to void after catheter is removed. She verbalizes understanding.

## 2012-03-28 NOTE — Op Note (Signed)
03/28/2012  11:16 AM  PATIENT:  [name]  79 y.o. male  Patient Care Team: Etta Grandchild, MD as PCP - General (Internal Medicine) Robynn Pane, MD as Consulting Physician (Cardiology) Theda Belfast, MD as Consulting Physician (Gastroenterology)  PRE-OPERATIVE DIAGNOSIS:  bilateral inguinal hernia (right recurrent)  POST-OPERATIVE DIAGNOSIS:  bilateral indirect inguinal hernia  (right recurrent)   PROCEDURE:  Procedure(s): LAPAROSCOPIC BILATERAL INGUINAL HERNIA REPAIR INSERTION OF MESH  SURGEON:  Surgeon(s): Ardeth Sportsman, MD  ASSISTANT: RN   ANESTHESIA:   local and general  EBL:  Total I/O In: 600 [I.V.:600] Out: 10 [Blood:10]  Delay start of Pharmacological VTE agent (>24hrs) due to surgical blood loss or risk of bleeding:  no  DRAINS: none   SPECIMEN:  No Specimen  DISPOSITION OF SPECIMEN:  N/A  COUNTS:  YES  PLAN OF CARE: Discharge to home after PACU  PATIENT DISPOSITION:  PACU - hemodynamically stable.  INDICATION: Pleasant elderly male with recurrent RIH & new LIH.  I recommended surgery  The anatomy & physiology of the abdominal wall and pelvic floor was discussed.  The pathophysiology of hernias in the inguinal and pelvic region was discussed.  Natural history risks such as progressive enlargement, pain, incarceration & strangulation was discussed.   Contributors to complications such as smoking, obesity, diabetes, prior surgery, etc were discussed.    I feel the risks of no intervention will lead to serious problems that outweigh the operative risks; therefore, I recommended surgery to reduce and repair the hernia.  I explained laparoscopic techniques with possible need for an open approach.  I noted usual use of mesh to patch and/or buttress hernia repair  Risks such as bleeding, infection, abscess, need for further treatment, heart attack, death, and other risks were discussed.  I noted a good likelihood this will help address the problem.   Goals  of post-operative recovery were discussed as well.  Possibility that this will not correct all symptoms was explained.  I stressed the importance of low-impact activity, aggressive pain control, avoiding constipation, & not pushing through pain to minimize risk of post-operative chronic pain or injury. Possibility of reherniation was discussed.  We will work to minimize complications.     An educational handout further explaining the pathology & treatment options was given as well.  Questions were answered.  The patient expresses understanding & wishes to proceed with surgery.  OR FINDINGS: BIH indirect R>L.  DESCRIPTION:   The patient was identified & brought into the operating room. The patient was positioned supine with arms tucked. SCDs were active during the entire case. The patient underwent general anesthesia without any difficulty.  The abdomen was prepped and draped in a sterile fashion. The patient's bladder was emptied.  A Surgical Timeout confirmed our plan.  I made a transverse incision through the inferior umbilical fold.  I made a small transverse nick through the anterior rectus fascia contralateral to the inguinal hernia side and placed a 0-vicryl stitch through the fascia.  I placed a Hasson trocar into the preperitoneal plane.  Entry was clean.  We induced carbon dioxide insufflation. Camera inspection revealed no injury.  I used a 10mm angled scope to bluntly free the peritoneum off the infraumbilical anterior abdominal wall.  I created enough of a preperitoneal pocket to place 5mm ports into the right & left mid-abdomen into this preperitoneal cavity.  I focused attention on the right side since that was the dominant hernia side.   I used blunt &  focused sharp dissection to free the peritoneum off the flank and down to the pubic rim.  I freed the anteriolateral bladder wall off the anteriolateral pelvic wall, sparing midline attachments.   I located a swath of peritoneum going into a  hernia fascial defect at the internal ring consistent with an indirect right inguinal hernia.  I gradually freed the peritoneal hernia sac off safely and reduced it into the preperitoneal space.  I freed the peritoneum off the spermatic vessels & vas deferens.  I freed peritoneum off the retroperitoneum along the psoas muscle.    I checked & assured hemostasis.    I turned attention on the opposite side.  I did dissection in a similar, mirror-image fashion. The patient had a similar indirect hernia with a small direct hernia as well  In dissecting, I did have a breech in the peritoneum on the right midabdomen.  I closed that using absorbable Vicryl stitch using laparoscopic intracorporeal suturing.   This allowed me to do high ligation of the larger peritoneal hernia sac with that repair as well.  I chose 15x15 cm sheets of ultra-lightweight polypropylene mesh (Ultrapro), one for each side.  I cut a single sigmoid-shaped slit ~6cm from a corner of each mesh.  I placed the meshes into the preperitoneal space & laid them as overlapping diamonds such that at the inferior points, a 6x6 cm corner flap rested in the true anterolateral pelvis, covering the obturator & femoral foramina.   I allowed the bladder to fall back and help tuck the corners of the mesh in.  The medial corners overlapped each other across midline cephalad to the pubic rim.   This provided >2 inch coverage around the hernia.  Because the defects well covered and not particularly large, I did not need tacks to hold the mesh in place  I held the hernia sacs cephalad & evacuated carbon dioxide.  I closed the fascia  With absorbable suture.  I closed the skin using 4-0 monocryl stitch.  Sterile dressings were applied. The patient was extubated & arrived in the PACU in stable condition..  I had discussed postoperative care with the patient in the holding area.   I did discuss operative findings and postoperative goals / instructions to family as  well.  Instructions are written in the chart.

## 2012-03-28 NOTE — Progress Notes (Addendum)
Pt OOB to BR c/o having to "pee real bad." Able to void only 50 cc . Bladder scanned for 990 cc or urine. Paged Dr Michaell Cowing. Orders received to place indwelling catheter and ask pt to come to [facility] to have it removed or remove it at home on 03/31/12.

## 2012-03-28 NOTE — Anesthesia Postprocedure Evaluation (Signed)
  Anesthesia Post-op Note  Patient: [name]  Procedure(s) Performed: Procedure(s) (LRB): LAPAROSCOPIC BILATERAL INGUINAL HERNIA REPAIR (Bilateral) INSERTION OF MESH (Bilateral)  Patient Location: PACU  Anesthesia Type: General  Level of Consciousness: awake and alert   Airway and Oxygen Therapy: Patient Spontanous Breathing  Post-op Pain: mild  Post-op Assessment: Post-op Vital signs reviewed, Patient's Cardiovascular Status Stable, Respiratory Function Stable, Patent Airway and No signs of Nausea or vomiting  Last Vitals:  Filed Vitals:   03/28/12 1200  BP: 147/67  Pulse:   Temp: 36.6 C  Resp:     Post-op Vital Signs: stable   Complications: No apparent anesthesia complications

## 2012-03-28 NOTE — Transfer of Care (Signed)
Immediate Anesthesia Transfer of Care Note  Patient: [name]  Procedure(s) Performed: Procedure(s): LAPAROSCOPIC BILATERAL INGUINAL HERNIA REPAIR (Bilateral) INSERTION OF MESH (Bilateral)  Patient Location: PACU  Anesthesia Type:General  Level of Consciousness: awake, alert , oriented, patient cooperative and responds to stimulation  Airway & Oxygen Therapy: Patient Spontanous Breathing and Patient connected to face mask oxygen  Post-op Assessment: Report given to PACU RN, Post -op Vital signs reviewed and stable and Patient moving all extremities X 4  Post vital signs: Reviewed and stable  Complications: No apparent anesthesia complications

## 2012-03-28 NOTE — H&P (Signed)
[name]  Jun 10, 1929 [id number]   This patient is a 79 y.o.male who presents today for surgical evaluation at the request of Dr. Yetta Barre.   Reason for evaluation: Right inguinal hernia. Nausea vomiting abdominal pain.   Pleasant elderly gentleman. Struggles with chronic constipation. Has issues of heartburn and reflux. Found to have mild esophageal stricture and sliding hiatal hernia by Dr. Audley Hose on endoscopy in 2011. On Dexilant and Zantac. Still complains of heartburn and reflux.  Known to have a hernia in his right groin. Found to be much larger more recently. LDL time. Some soreness. As noted emergency room. Nausea vomiting abdominal pain. Workup otherwise negative. Found to have inguinal hernia on CT scan on the left side as well.Normally has a bowel movement twice a week. Can walk about 20 minutes before stopping. Occasionally gets tired but no exertional chest pain. Appendectomy age 30 but no other abdominal surgeries. No hernia surgeries. On Flomax for some prostatism but no change in urination. On chronic anticoagulation he thinks because he had a stroke.Very hard of hearing so the conversation took a while. Used to be a heavy smoker but has been abstinent for 20 years.  No new events   Past Medical History  Diagnosis Date  . GERD (gastroesophageal reflux disease)   . Hypothyroidism   . Chronic back pain   . Stroke   . Gout     no problems in 2 years  . Pulmonary embolism, bilateral 2010  . Hyperlipemia   . Myocardial infarction 1987; 1989  . Anginal pain   . DVT, lower extremity 1980's    LLE  . Asthma     "touch"  . Pneumonia 1970's  . Renal disorder ~ 03/2011    "wasn't functioning right"  . Arthritis     "mostly in my knees and shoulders"  . Stroke     "just a little one"  . Collapsed lung     "for 14 years" right   . Diabetes mellitus   . Hypertension   . Depression   . Anxiety   . Hearing loss   . Glaucoma     Past Surgical History  Procedure Laterality  Date  . Esophagogastroduodenoscopy  12/22/2010    Procedure: ESOPHAGOGASTRODUODENOSCOPY (EGD);  Surgeon: Theda Belfast;  Location: [facility];  Service: Endoscopy;  Laterality: N/A;  . Appendectomy  1950  . Cataract extraction w/ intraocular lens  implant, bilateral Bilateral 1990's  . Cardiac catheterization    . Coronary angioplasty with stent placement      "think I have 4 total"    History   Social History  . Marital Status: Divorced    Spouse Name: N/A    Number of Children: N/A  . Years of Education: N/A   Occupational History  . Not on file.   Social History Main Topics  . Smoking status: Former Smoker -- 2.00 packs/day for 3 years    Types: Cigarettes    Start date: 01/27/1946    Quit date: 01/22/1981  . Smokeless tobacco: Never Used  . Alcohol Use: No     Comment: 07/12/2011; "last drink of alcohol was ~ 1983"  . Drug Use: Yes    Special: Hashish, Marijuana     Comment: last in 1982  . Sexually Active: No   Other Topics Concern  . Not on file   Social History Narrative  . No narrative on file    Family History  Problem Relation Age of Onset  . Diabetes Mother   .  Heart disease Father   . Lung cancer Brother   . Stroke Neg Hx   . Hypertension Neg Hx   . Hyperlipidemia Neg Hx   . Alcohol abuse Neg Hx     Current Facility-Administered Medications  Medication Dose Route Frequency Provider Last Rate Last Dose  . ceFAZolin (ANCEF) IVPB 2 g/50 mL premix  2 g Intravenous 60 min Pre-Op Ardeth Sportsman, MD         No Known Allergies    BP 150/75  Pulse 73  Temp(Src) 97.8 F (36.6 C)  Resp 20  SpO2 100%     Results:   Labs: Results for orders placed during the hospital encounter of 03/28/12 (from the past 48 hour(s))  PROTIME-INR     Status: Abnormal   Collection Time    03/28/12  8:10 AM      Result Value Range   Prothrombin Time 15.9 (*) 11.6 - 15.2 seconds   INR 1.30  0.00 - 1.49  GLUCOSE, CAPILLARY     Status: Abnormal   Collection  Time    03/28/12  8:12 AM      Result Value Range   Glucose-Capillary 113 (*) 70 - 99 mg/dL   Comment 1 Documented in Chart      Imaging / Studies: No results found.  Medications / Allergies: per chart  Antibiotics: Anti-infectives   Start     Dose/Rate Route Frequency Ordered Stop   03/28/12 0800  ceFAZolin (ANCEF) IVPB 2 g/50 mL premix     2 g 100 mL/hr over 30 Minutes Intravenous 60 min pre-op 03/28/12 0744        Review of Systems  Constitutional: Negative for fever, chills and diaphoresis.  HENT: Positive for hearing loss. Negative for nosebleeds, sore throat, facial swelling, mouth sores, trouble swallowing and ear discharge.  Eyes: Negative for photophobia, discharge and visual disturbance.  Respiratory: Negative for choking, chest tightness, shortness of breath and stridor.  Cardiovascular: Negative for chest pain and palpitations.  Gastrointestinal: Negative for nausea, vomiting, abdominal pain, diarrhea, constipation, blood in stool, abdominal distention, anal bleeding and rectal pain.  Genitourinary: Negative for dysuria, urgency, difficulty urinating and testicular pain.  Musculoskeletal: Negative for myalgias, back pain, arthralgias and gait problem.  Skin: Negative for color change, pallor, rash and wound.  Neurological: Negative for dizziness, speech difficulty, weakness, numbness and headaches.  Hematological: Negative for adenopathy. Does not bruise/bleed easily.  Psychiatric/Behavioral: Negative for hallucinations, confusion and agitation.   Objective:   Physical Exam  Constitutional: He is oriented to person, place, and time. He appears well-developed and well-nourished. No distress.  Walks around quite easily without difficulty.  HENT:  Head: Normocephalic.  Right Ear: Decreased hearing is noted.  Left Ear: Decreased hearing is noted.  Mouth/Throat: Oropharynx is clear and moist. No oropharyngeal exudate.  Eyes: Conjunctivae normal and EOM are normal.  Pupils are equal, round, and reactive to light. No scleral icterus.  Neck: Normal range of motion. Neck supple. No tracheal deviation present.  Cardiovascular: Normal rate, regular rhythm and intact distal pulses.  Pulmonary/Chest: Effort normal and breath sounds normal. No respiratory distress.  Abdominal: Soft. Bowel sounds are normal. He exhibits no distension, no pulsatile liver and no ascites. There is no tenderness. There is no rigidity, no rebound, no guarding, no CVA tenderness and negative Murphy's sign. A hernia is present. Hernia confirmed positive in the right inguinal area and confirmed positive in the left inguinal area. Hernia confirmed negative in the ventral area.  Musculoskeletal: Normal range of motion. He exhibits no tenderness.  Lymphadenopathy:  He has no cervical adenopathy.  Right: No inguinal adenopathy present.  Left: No inguinal adenopathy present.  Neurological: He is alert and oriented to person, place, and time. No cranial nerve deficit. He exhibits normal muscle tone. Coordination normal.  Skin: Skin is warm and dry. No rash noted. He is not diaphoretic. No erythema. No pallor.  Psychiatric: He has a normal mood and affect. His behavior is normal. Judgment and thought content normal.  Assessment:   Bilateral inguinal hernias, right greater than left. Larger since CT diagnosis Early 2013  Chronic constipation  Epigastric abdominal discomfort with reflux and vomiting. History of esophageal stricture (mild) and small sliding hiatal hernia followed by Dr. Elnoria Howard up with gastroenterology. Already on Dexilant and ranitidine. Last seen 2011.  Plan:   His hernia is large enough that it could be causing intermittent obstruction. Think he would benefit from repair. I would fix both sides.  The anatomy & physiology of the abdominal wall and pelvic floor was discussed. The pathophysiology of hernias in the inguinal and pelvic region was discussed. Natural history risks such as  progressive enlargement, pain, incarceration & strangulation was discussed. Contributors to complications such as smoking, obesity, diabetes, prior surgery, etc were discussed.  I feel the risks of no intervention will lead to serious problems that outweigh the operative risks; therefore, I recommended surgery to reduce and repair the hernia. I explained laparoscopic techniques with possible need for an open approach. I noted usual use of mesh to patch and/or buttress hernia repair  Risks such as bleeding, infection, abscess, need for further treatment, heart attack, death, and other risks were discussed. I noted a good likelihood this will help address the problem. Goals of post-operative recovery were discussed as well. Possibility that this will not correct all symptoms was explained. I stressed the importance of low-impact activity, aggressive pain control, avoiding constipation, & not pushing through pain to minimize risk of post-operative chronic pain or injury. Possibility of reherniation was discussed. We will work to minimize complications.  An educational handout further explaining the pathology & treatment options was given as well. Questions were answered. The patient expresses understanding & wishes to proceed with surgery.   Cardiac clearance done.  I strongly recommend he use MiraLAX twice a day to get his constipation under control. I noted that constipation can make abdominal issues worse. Control the constipation and see what symptoms improved.  If he has persistent nausea vomiting and reflux after having his constipation controlled and hernias fixed, consider reevaluation by his gastroenterologist, Dr. Elnoria Howard, to rule out delayed gastric emptying snce he is a diabetic with chronic constipation and worsening reflux. See if more needs to be done. I do not think it will come to a paraesophageal hiatal hernia repair. I would definitely hold off and make sure everything else has been addressed  first.   I have re-reviewed the the patient's records, history, medications, and allergies.  I have re-examined the patient.  I again discussed intraoperative plans and goals of post-operative recovery.  The patient agrees to proceed.    Ardeth Sportsman, M.D., F.A.C.S. Gastrointestinal and Minimally Invasive Surgery [facility] [address] [place], [place] [address] [phone] Main / Paging [phone] Voice Mail   03/28/2012  CARE TEAM:  PCP: Sanda Linger, MD  Outpatient Care Team: Patient Care Team: Etta Grandchild, MD as PCP - General (Internal Medicine) Eduardo Osier  Sharyn Lull, MD as Consulting Physician (Cardiology) Theda Belfast, MD as Consulting Physician (Gastroenterology)  Inpatient Treatment Team: Treatment Team: Attending Provider: Ardeth Sportsman, MD

## 2012-03-31 ENCOUNTER — Encounter (HOSPITAL_COMMUNITY): Payer: Self-pay | Admitting: Surgery

## 2012-04-03 ENCOUNTER — Telehealth: Payer: Self-pay | Admitting: Internal Medicine

## 2012-04-03 NOTE — Telephone Encounter (Signed)
[facility] Triage Call Report Triage Record Num: [id number] Operator: Maryfrances Bunnell Patient Name: [name] Call Date & Time: 04/02/2012 5:28:06PM Patient Phone: [phone] PCP: Sanda Linger Patient Gender: Male PCP Fax : Patient DOB: April 15, 1929 Practice Name: [facility] Reason for Call: Caller: Annie/Other; PCP: Sanda Linger (Adults only); CB#: [phone]; Call regarding Constipation; Has used Prune juice, Miralax. Last good bm 3/8, today having small ones with hard smalll "balls", does feel some better. Recent Hernia surgery 03/28/12. While on the phone with caller patient had to go to the bathroom. Triaged per Constipation protocol "Recent surgery, trauma, childbirth or GI procedure" advised to continue Miralax as ordered. F/U with office within 24 hours if no bm. Protocol(s) Used: Constipation Recommended Outcome per Protocol: Call Provider within 24 Hours Reason for Outcome: Recent surgery, trauma, childbirth or GI procedure (such as barium enema) Care Advice: ~ Follow provider's instructions for post-procedure care. ~ SYMPTOM / CONDITION MANAGEMENT ~ CAUTIONS Constipation Care Measures: - Drink 8 to 10 glasses of liquid per day, more if breastfeeding. - Drink warm water or coffee early in the morning. - Gradually increase dietary fiber (fresh fruits/vegetables, whole grain bread and cereals). - As tolerated, walk 30 minutes at a steady pace daily. - Consider nonprescription stool softeners (Colace) per label, pharmacist or provider recommendations. Stool softners are not habit forming as some stimulant laxatives may become. - Consider nonprescription bulk forming laxatives (such as Metamucil, FiberCon, Citrucel, etc.); follow package directions. - Avoid routine use of strong laxatives/enemas/suppositories unless ordered by provider. - Do not delay having bowel movement when having urge. - Keep a routine; attempt bowel movement within half hour after a meal or after  some exercise. ~ Call provider immediately if having constant, severe or worsening abdominal pain, cramping or rectal pain, vomiting, new more than scant rectal bleeding or bloody or black tarry stools or very thin, pencil-like stools. Do not take any laxatives until talking with provider.

## 2012-04-16 ENCOUNTER — Ambulatory Visit (INDEPENDENT_AMBULATORY_CARE_PROVIDER_SITE_OTHER): Payer: Medicare Other | Admitting: Surgery

## 2012-04-16 ENCOUNTER — Encounter (INDEPENDENT_AMBULATORY_CARE_PROVIDER_SITE_OTHER): Payer: Self-pay | Admitting: Surgery

## 2012-04-16 VITALS — BP 140/78 | HR 79 | Temp 97.3°F | Ht 68.5 in | Wt 175.4 lb

## 2012-04-16 DIAGNOSIS — K4091 Unilateral inguinal hernia, without obstruction or gangrene, recurrent: Secondary | ICD-10-CM

## 2012-04-16 DIAGNOSIS — K219 Gastro-esophageal reflux disease without esophagitis: Secondary | ICD-10-CM

## 2012-04-16 DIAGNOSIS — K4020 Bilateral inguinal hernia, without obstruction or gangrene, not specified as recurrent: Secondary | ICD-10-CM

## 2012-04-16 NOTE — Progress Notes (Signed)
Subjective:     Patient ID: [name], male   DOB: [birth year], 79 y.o.   MRN: [id number]  HPI  [name]  April 19, 1929 [id number]  Patient Care Team: Etta Grandchild, MD as PCP - General (Internal Medicine) Robynn Pane, MD as Consulting Physician (Cardiology) Theda Belfast, MD as Consulting Physician (Gastroenterology)  This patient is a 79 y.o.male who presents today for surgical evaluation   POST-OPERATIVE DIAGNOSIS: bilateral indirect inguinal hernia (right recurrent)   PROCEDURE:  LAPAROSCOPIC BILATERAL INGUINAL HERNIA REPAIR  INSERTION OF MESH   The patient comes in today feeling rather well.  No fevers or chills.  Has a little bit of mild urinary incontinence and nine but that is stable.  Not worse.  No fevers or chills.  Not constipated.  Same scrotal swelling concerning but getting better.  Does not like the chicken or beef.  Still with some heartburn or reflux.  On his medications.  Walking OK with cane.  Patient Active Problem List  Diagnosis  . Type II or unspecified type diabetes mellitus with renal manifestations, uncontrolled(250.42)  . HYPERTENSION, BENIGN SYSTEMIC  . CLAUDICATION, INTERMITTENT  . RHINITIS, ALLERGIC  . Chronic renal insufficiency, stage I  . BPH (benign prostatic hyperplasia)  . DJD (degenerative joint disease) of knee  . GERD (gastroesophageal reflux disease)  . Inguinal hernia recurrent unilateral  . Constipation, chronic  . Bilateral inguinal hernia (BIH), R>L  . Esophageal stricture  . Paraesophageal sliding hiatal hernia  . Personal history of colonic polyps  . Anticoagulated on warfarin    Past Medical History  Diagnosis Date  . GERD (gastroesophageal reflux disease)   . Hypothyroidism   . Chronic back pain   . Stroke   . Gout     no problems in 2 years  . Pulmonary embolism, bilateral 2010  . Hyperlipemia   . Myocardial infarction 1987; 1989  . Anginal pain   . DVT, lower extremity 1980's    LLE  . Asthma      "touch"  . Pneumonia 1970's  . Renal disorder ~ 03/2011    "wasn't functioning right"  . Arthritis     "mostly in my knees and shoulders"  . Stroke     "just a little one"  . Collapsed lung     "for 14 years" right   . Diabetes mellitus   . Hypertension   . Depression   . Anxiety   . Hearing loss   . Glaucoma     Past Surgical History  Procedure Laterality Date  . Esophagogastroduodenoscopy  12/22/2010    Procedure: ESOPHAGOGASTRODUODENOSCOPY (EGD);  Surgeon: Theda Belfast;  Location: [facility];  Service: Endoscopy;  Laterality: N/A;  . Appendectomy  1950  . Cataract extraction w/ intraocular lens  implant, bilateral Bilateral 1990's  . Cardiac catheterization    . Coronary angioplasty with stent placement      "think I have 4 total"  . Inguinal hernia repair Bilateral 03/28/2012    Procedure: LAPAROSCOPIC BILATERAL INGUINAL HERNIA REPAIR;  Surgeon: Ardeth Sportsman, MD;  Location: [facility];  Service: General;  Laterality: Bilateral;  . Insertion of mesh Bilateral 03/28/2012    Procedure: INSERTION OF MESH;  Surgeon: Ardeth Sportsman, MD;  Location: [facility];  Service: General;  Laterality: Bilateral;  . Hernia repair      History   Social History  . Marital Status: Divorced    Spouse Name: N/A    Number of Children: N/A  . Years  of Education: N/A   Occupational History  . Not on file.   Social History Main Topics  . Smoking status: Former Smoker -- 2.00 packs/day for 3 years    Types: Cigarettes    Start date: 01/27/1946    Quit date: 01/22/1981  . Smokeless tobacco: Never Used  . Alcohol Use: No     Comment: 07/12/2011; "last drink of alcohol was ~ 1983"  . Drug Use: Yes    Special: Hashish, Marijuana     Comment: last in 1982  . Sexually Active: No   Other Topics Concern  . Not on file   Social History Narrative  . No narrative on file    Family History  Problem Relation Age of Onset  . Diabetes Mother   . Heart disease Father   . Lung cancer Brother    . Stroke Neg Hx   . Hypertension Neg Hx   . Hyperlipidemia Neg Hx   . Alcohol abuse Neg Hx     Current Outpatient Prescriptions  Medication Sig Dispense Refill  . acetaminophen (TYLENOL) 500 MG tablet Take 500 mg by mouth every 6 (six) hours as needed for pain.      Marland Kitchen albuterol (PROVENTIL HFA;VENTOLIN HFA) 108 (90 BASE) MCG/ACT inhaler Inhale 2 puffs into the lungs every 6 (six) hours as needed. For shortness of breath      . ALPRAZolam (XANAX) 0.25 MG tablet Take 0.25 mg by mouth at bedtime as needed. For sleep      . amiodarone (PACERONE) 200 MG tablet Take 200 mg by mouth daily.       Marland Kitchen aspirin 81 MG tablet Take 81 mg by mouth daily.      Marland Kitchen dexlansoprazole (DEXILANT) 60 MG capsule Take 60 mg by mouth daily.       . furosemide (LASIX) 40 MG tablet Take 20 mg by mouth daily at 12 noon.       Marland Kitchen glimepiride (AMARYL) 1 MG tablet Take 1 mg by mouth daily before breakfast.       . isosorbide mononitrate (IMDUR) 60 MG 24 hr tablet Take 60 mg by mouth every morning.       Marland Kitchen levothyroxine (SYNTHROID, LEVOTHROID) 50 MCG tablet Take 50 mcg by mouth daily at 12 noon.       . lubiprostone (AMITIZA) 24 MCG capsule Take 24 mcg by mouth 2 (two) times daily with a meal.      . nitroGLYCERIN (NITROSTAT) 0.4 MG SL tablet Place 0.4 mg under the tongue every 5 (five) minutes as needed for chest pain.      Marland Kitchen oxyCODONE (OXY IR/ROXICODONE) 5 MG immediate release tablet Take 1-2 tablets (5-10 mg total) by mouth every 4 (four) hours as needed for pain.  50 tablet  0  . ranitidine (ZANTAC) 300 MG capsule Take 300 mg by mouth every evening.      . rosuvastatin (CRESTOR) 10 MG tablet Take 1 tablet (10 mg total) by mouth daily.  30 tablet  1  . Tamsulosin HCl (FLOMAX) 0.4 MG CAPS Take 0.4 mg by mouth at bedtime.       . traMADol (ULTRAM) 50 MG tablet Take 50 mg by mouth every 6 (six) hours as needed. For pain      . warfarin (COUMADIN) 4 MG tablet Take 1 tablet (4 mg total) by mouth daily at 6 PM.  35 tablet  3    No current facility-administered medications for this visit.     No Known Allergies  BP 140/78  Pulse 79  Temp(Src) 97.3 F (36.3 C) (Temporal)  Ht 5' 8.5" (1.74 m)  Wt 175 lb 6.4 oz (79.561 kg)  BMI 26.28 kg/m2  SpO2 98%  No results found.   Review of Systems  Constitutional: Negative for fever, chills and diaphoresis.  HENT: Negative for sore throat, trouble swallowing and neck pain.   Eyes: Negative for photophobia and visual disturbance.  Respiratory: Negative for choking and shortness of breath.   Cardiovascular: Negative for chest pain and palpitations.  Gastrointestinal: Negative for nausea, vomiting, abdominal distention, anal bleeding and rectal pain.       Still struggling with some heartburn or reflux despite being on ranitidine and PP Dexilant.    Genitourinary: Negative for dysuria, urgency, difficulty urinating and testicular pain.  Musculoskeletal: Negative for myalgias, arthralgias and gait problem.  Skin: Negative for color change and rash.  Neurological: Negative for dizziness, speech difficulty, weakness and numbness.  Hematological: Negative for adenopathy.  Psychiatric/Behavioral: Negative for hallucinations, confusion and agitation.       Objective:   Physical Exam  Constitutional: He is oriented to person, place, and time. He appears well-developed and well-nourished. No distress.  HENT:  Head: Normocephalic.  Mouth/Throat: Oropharynx is clear and moist. No oropharyngeal exudate.  Eyes: Conjunctivae and EOM are normal. Pupils are equal, round, and reactive to light. No scleral icterus.  Neck: Normal range of motion. No tracheal deviation present.  Cardiovascular: Normal rate, normal heart sounds and intact distal pulses.   Pulmonary/Chest: Effort normal. No respiratory distress.  Abdominal: Soft. He exhibits no distension. There is no tenderness. Hernia confirmed negative in the right inguinal area and confirmed negative in the left inguinal  area.  Dressings on - i removed.  Incisions clean with normal healing ridges.  No hernias  Musculoskeletal: Normal range of motion. He exhibits no tenderness.  Neurological: He is alert and oriented to person, place, and time. No cranial nerve deficit. He exhibits normal muscle tone. Coordination normal.  Skin: Skin is warm and dry. No rash noted. He is not diaphoretic.  Psychiatric: He has a normal mood and affect. His behavior is normal.       Assessment:     Recovering from repair of bilateral inguinal hernias laparoscopically.     Plan:     Increase activity as tolerated to regular activity.  Do not push through pain.  Diet as tolerated. Bowel regimen to avoid problems.  Modified diet for heartburn reflux.  If not improving, consider evaluation by gastroenterologist disease medications need to be adjusted.  While technically feasible to do a laparoscopic paraesophageal hiatal hernia repair, I would not rush to do it in an 79 year old man with numerous comorbidities.  Return to clinic p.r.n.   Instructions discussed.  Followup with primary care physician for other health issues as would normally be done.  Questions answered.  The patient expressed understanding and appreciation

## 2012-04-16 NOTE — Patient Instructions (Addendum)
Managing Pain  Pain after surgery or related to activity is often due to strain/injury to muscle, tendon, nerves and/or incisions.  This pain is usually short-term and will improve in a few months.   Many people find it helpful to do the following things TOGETHER to help speed the process of healing and to get back to regular activity more quickly:  1. Avoid heavy physical activity a.  no lifting greater than 20 pounds b. Do not "push through" the pain.  Listen to your body and avoid positions and maneuvers than reproduce the pain c. Walking is okay as tolerated, but go slowly and stop when getting sore.  d. Remember: If it hurts to do it, then don't do it! 2. Take Anti-inflammatory medication  a. Take with food/snack around the clock for 1-2 weeks i. This helps the muscle and nerve tissues become less irritable and calm down faster b. Choose ONE of the following over-the-counter medications: i. Naproxen 220mg tabs (ex. Aleve) 1-2 pills twice a day  ii. Ibuprofen 200mg tabs (ex. Advil, Motrin) 3-4 pills with every meal and just before bedtime iii. Acetaminophen 500mg tabs (Tylenol) 1-2 pills with every meal and just before bedtime 3. Use a Heating pad or Ice/Cold Pack a. 4-6 times a day b. May use warm bath/hottub  or showers 4. Try Gentle Massage and/or Stretching  a. at the area of pain many times a day b. stop if you feel pain - do not overdo it  Try these steps together to help you body heal faster and avoid making things get worse.  Doing just one of these things may not be enough.    If you are not getting better after two weeks or are noticing you are getting worse, contact our office for further advice; we may need to re-evaluate you & see what other things we can do to help.  GETTING TO GOOD BOWEL HEALTH. Irregular bowel habits such as constipation and diarrhea can lead to many problems over time.  Having one soft bowel movement a day is the most important way to prevent  further problems.  The anorectal canal is designed to handle stretching and feces to safely manage our ability to get rid of solid waste (feces, poop, stool) out of our body.  BUT, hard constipated stools can act like ripping concrete bricks and diarrhea can be a burning fire to this very sensitive area of our body, causing inflamed hemorrhoids, anal fissures, increasing risk is perirectal abscesses, abdominal pain/bloating, an making irritable bowel worse.     The goal: ONE SOFT BOWEL MOVEMENT A DAY!  To have soft, regular bowel movements:    Drink at least 8 tall glasses of water a day.     Take plenty of fiber.  Fiber is the undigested part of plant food that passes into the colon, acting s "natures broom" to encourage bowel motility and movement.  Fiber can absorb and hold large amounts of water. This results in a larger, bulkier stool, which is soft and easier to pass. Work gradually over several weeks up to 6 servings a day of fiber (25g a day even more if needed) in the form of: o Vegetables -- Root (potatoes, carrots, turnips), leafy green (lettuce, salad greens, celery, spinach), or cooked high residue (cabbage, broccoli, etc) o Fruit -- Fresh (unpeeled skin & pulp), Dried (prunes, apricots, cherries, etc ),  or stewed ( applesauce)  o Whole grain breads, pasta, etc (whole wheat)  o Bran cereals      Bulking Agents -- This type of water-retaining fiber generally is easily obtained each day by one of the following:  o Psyllium bran -- The psyllium plant is remarkable because its ground seeds can retain so much water. This product is available as Metamucil, Konsyl, Effersyllium, Per Diem Fiber, or the less expensive generic preparation in drug and health food stores. Although labeled a laxative, it really is not a laxative.  o Methylcellulose -- This is another fiber derived from wood which also retains water. It is available as Citrucel. o Polyethylene Glycol - and "artificial" fiber commonly called  Miralax or Glycolax.  It is helpful for people with gassy or bloated feelings with regular fiber o Flax Seed - a less gassy fiber than psyllium   No reading or other relaxing activity while on the toilet. If bowel movements take longer than 5 minutes, you are too constipated   AVOID CONSTIPATION.  High fiber and water intake usually takes care of this.  Sometimes a laxative is needed to stimulate more frequent bowel movements, but    Laxatives are not a good long-term solution as it can wear the colon out. o Osmotics (Milk of Magnesia, Fleets phosphosoda, Magnesium citrate, MiraLax, GoLytely) are safer than  o Stimulants (Senokot, Castor Oil, Dulcolax, Ex Lax)    o Do not take laxatives for more than 7days in a row.    IF SEVERELY CONSTIPATED, try a Bowel Retraining Program: o Do not use laxatives.  o Eat a diet high in roughage, such as bran cereals and leafy vegetables.  o Drink six (6) ounces of prune or apricot juice each morning.  o Eat two (2) large servings of stewed fruit each day.  o Take one (1) heaping tablespoon of a psyllium-based bulking agent twice a day. Use sugar-free sweetener when possible to avoid excessive calories.  o Eat a normal breakfast.  o Set aside 15 minutes after breakfast to sit on the toilet, but do not strain to have a bowel movement.  o If you do not have a bowel movement by the third day, use an enema and repeat the above steps.    Controlling diarrhea o Switch to liquids and simpler foods for a few days to avoid stressing your intestines further. o Avoid dairy products (especially milk & ice cream) for a short time.  The intestines often can lose the ability to digest lactose when stressed. o Avoid foods that cause gassiness or bloating.  Typical foods include beans and other legumes, cabbage, broccoli, and dairy foods.  Every person has some sensitivity to other foods, so listen to our body and avoid those foods that trigger problems for you. o Adding fiber  (Citrucel, Metamucil, psyllium, Miralax) gradually can help thicken stools by absorbing excess fluid and retrain the intestines to act more normally.  Slowly increase the dose over a few weeks.  Too much fiber too soon can backfire and cause cramping & bloating. o Probiotics (such as active yogurt, Align, etc) may help repopulate the intestines and colon with normal bacteria and calm down a sensitive digestive tract.  Most studies show it to be of mild help, though, and such products can be costly. o Medicines:   Bismuth subsalicylate (ex. Kayopectate, Pepto Bismol) every 30 minutes for up to 6 doses can help control diarrhea.  Avoid if pregnant.   Loperamide (Immodium) can slow down diarrhea.  Start with two tablets (4mg total) first and then try one tablet every 6 hours.  Avoid if you   are having fevers or severe pain.  If you are not better or start feeling worse, stop all medicines and call your doctor for advice o Call your doctor if you are getting worse or not better.  Sometimes further testing (cultures, endoscopy, X-ray studies, bloodwork, etc) may be needed to help diagnose and treat the cause of the diarrhea.  Diet for Gastroesophageal Reflux Disease, Adult Reflux (acid reflux) is when acid from your stomach flows up into the esophagus. When acid comes in contact with the esophagus, the acid causes irritation and soreness (inflammation) in the esophagus. When reflux happens often or so severely that it causes damage to the esophagus, it is called gastroesophageal reflux disease (GERD). Nutrition therapy can help ease the discomfort of GERD. FOODS OR DRINKS TO AVOID OR LIMIT  Smoking or chewing tobacco. Nicotine is one of the most potent stimulants to acid production in the gastrointestinal tract.  Caffeinated and decaffeinated coffee and black tea.  Regular or low-calorie carbonated beverages or energy drinks (caffeine-free carbonated beverages are allowed).   Strong spices, such as black  pepper, white pepper, red pepper, cayenne, curry powder, and chili powder.  Peppermint or spearmint.  Chocolate.  High-fat foods, including meats and fried foods. Extra added fats including oils, butter, salad dressings, and nuts. Limit these to less than 8 tsp per day.  Fruits and vegetables if they are not tolerated, such as citrus fruits or tomatoes.  Alcohol.  Any food that seems to aggravate your condition. If you have questions regarding your diet, call your caregiver or a registered dietitian. OTHER THINGS THAT MAY HELP GERD INCLUDE:   Eating your meals slowly, in a relaxed setting.  Eating 5 to 6 small meals per day instead of 3 large meals.  Eliminating food for a period of time if it causes distress.  Not lying down until 3 hours after eating a meal.  Keeping the head of your bed raised 6 to 9 inches (15 to 23 cm) by using a foam wedge or blocks under the legs of the bed. Lying flat may make symptoms worse.  Being physically active. Weight loss may be helpful in reducing reflux in overweight or obese adults.  Wear loose fitting clothing EXAMPLE MEAL PLAN This meal plan is approximately 2,000 calories based on https://www.[facility].org/ meal planning guidelines. Breakfast   cup cooked oatmeal.  1 cup strawberries.  1 cup low-fat milk.  1 oz almonds. Snack  1 cup cucumber slices.  6 oz yogurt (made from low-fat or fat-free milk). Lunch  2 slice whole-wheat bread.  2 oz sliced [place].  2 tsp mayonnaise.  1 cup blueberries.  1 cup snap peas. Snack  6 whole-wheat crackers.  1 oz string cheese. Dinner   cup brown rice.  1 cup mixed veggies.  1 tsp olive oil.  3 oz grilled fish. Document Released: 01/08/2005 Document Revised: 04/02/2011 Document Reviewed: 11/24/2010 [facility] Patient Information 2013 [place], [place].

## 2012-04-29 ENCOUNTER — Inpatient Hospital Stay (HOSPITAL_COMMUNITY)
Admission: EM | Admit: 2012-04-29 | Discharge: 2012-05-05 | DRG: 377 | Disposition: A | Discharge status: Home / Self Care | Payer: Medicare Other | Attending: Cardiology | Admitting: Cardiology

## 2012-04-29 ENCOUNTER — Encounter (HOSPITAL_COMMUNITY): Payer: Self-pay | Admitting: Cardiology

## 2012-04-29 DIAGNOSIS — N183 Chronic kidney disease, stage 3 unspecified: Secondary | ICD-10-CM | POA: Diagnosis present

## 2012-04-29 DIAGNOSIS — K922 Gastrointestinal hemorrhage, unspecified: Secondary | ICD-10-CM

## 2012-04-29 DIAGNOSIS — I509 Heart failure, unspecified: Secondary | ICD-10-CM | POA: Diagnosis present

## 2012-04-29 DIAGNOSIS — E7800 Pure hypercholesterolemia, unspecified: Secondary | ICD-10-CM | POA: Diagnosis present

## 2012-04-29 DIAGNOSIS — T45515A Adverse effect of anticoagulants, initial encounter: Secondary | ICD-10-CM | POA: Diagnosis present

## 2012-04-29 DIAGNOSIS — Z8673 Personal history of transient ischemic attack (TIA), and cerebral infarction without residual deficits: Secondary | ICD-10-CM

## 2012-04-29 DIAGNOSIS — K5730 Diverticulosis of large intestine without perforation or abscess without bleeding: Secondary | ICD-10-CM | POA: Diagnosis present

## 2012-04-29 DIAGNOSIS — Q391 Atresia of esophagus with tracheo-esophageal fistula: Secondary | ICD-10-CM

## 2012-04-29 DIAGNOSIS — Z87891 Personal history of nicotine dependence: Secondary | ICD-10-CM

## 2012-04-29 DIAGNOSIS — E119 Type 2 diabetes mellitus without complications: Secondary | ICD-10-CM | POA: Diagnosis present

## 2012-04-29 DIAGNOSIS — Z823 Family history of stroke: Secondary | ICD-10-CM

## 2012-04-29 DIAGNOSIS — I129 Hypertensive chronic kidney disease with stage 1 through stage 4 chronic kidney disease, or unspecified chronic kidney disease: Secondary | ICD-10-CM | POA: Diagnosis present

## 2012-04-29 DIAGNOSIS — E039 Hypothyroidism, unspecified: Secondary | ICD-10-CM | POA: Diagnosis present

## 2012-04-29 DIAGNOSIS — Z8249 Family history of ischemic heart disease and other diseases of the circulatory system: Secondary | ICD-10-CM

## 2012-04-29 DIAGNOSIS — K219 Gastro-esophageal reflux disease without esophagitis: Secondary | ICD-10-CM | POA: Diagnosis present

## 2012-04-29 DIAGNOSIS — M549 Dorsalgia, unspecified: Secondary | ICD-10-CM | POA: Diagnosis present

## 2012-04-29 DIAGNOSIS — Z86718 Personal history of other venous thrombosis and embolism: Secondary | ICD-10-CM

## 2012-04-29 DIAGNOSIS — E785 Hyperlipidemia, unspecified: Secondary | ICD-10-CM | POA: Diagnosis present

## 2012-04-29 DIAGNOSIS — I2510 Atherosclerotic heart disease of native coronary artery without angina pectoris: Secondary | ICD-10-CM | POA: Diagnosis present

## 2012-04-29 DIAGNOSIS — Z801 Family history of malignant neoplasm of trachea, bronchus and lung: Secondary | ICD-10-CM

## 2012-04-29 DIAGNOSIS — G8929 Other chronic pain: Secondary | ICD-10-CM | POA: Diagnosis present

## 2012-04-29 DIAGNOSIS — Z833 Family history of diabetes mellitus: Secondary | ICD-10-CM

## 2012-04-29 DIAGNOSIS — I5022 Chronic systolic (congestive) heart failure: Secondary | ICD-10-CM | POA: Diagnosis present

## 2012-04-29 DIAGNOSIS — T50995A Adverse effect of other drugs, medicaments and biological substances, initial encounter: Secondary | ICD-10-CM | POA: Diagnosis present

## 2012-04-29 DIAGNOSIS — D62 Acute posthemorrhagic anemia: Secondary | ICD-10-CM | POA: Diagnosis present

## 2012-04-29 DIAGNOSIS — I2589 Other forms of chronic ischemic heart disease: Secondary | ICD-10-CM | POA: Diagnosis present

## 2012-04-29 DIAGNOSIS — K5521 Angiodysplasia of colon with hemorrhage: Principal | ICD-10-CM | POA: Diagnosis present

## 2012-04-29 DIAGNOSIS — Z9861 Coronary angioplasty status: Secondary | ICD-10-CM

## 2012-04-29 LAB — GLUCOSE, CAPILLARY

## 2012-04-29 LAB — URINALYSIS, ROUTINE W REFLEX MICROSCOPIC
Leukocytes, UA: NEGATIVE
Nitrite: NEGATIVE
Protein, ur: NEGATIVE mg/dL
Specific Gravity, Urine: 1.009 (ref 1.005–1.030)
Urobilinogen, UA: 0.2 mg/dL (ref 0.0–1.0)

## 2012-04-29 LAB — CBC
MCH: 28.4 pg (ref 26.0–34.0)
MCHC: 35.4 g/dL (ref 30.0–36.0)
MCV: 80.3 fL (ref 78.0–100.0)
Platelets: 163 10*3/uL (ref 150–400)
RDW: 16.1 % — ABNORMAL HIGH (ref 11.5–15.5)

## 2012-04-29 LAB — CBC WITH DIFFERENTIAL/PLATELET
Basophils Absolute: 0 10*3/uL (ref 0.0–0.1)
Eosinophils Absolute: 0 10*3/uL (ref 0.0–0.7)
Eosinophils Relative: 0 % (ref 0–5)
HCT: 27.7 % — ABNORMAL LOW (ref 39.0–52.0)
MCH: 28.7 pg (ref 26.0–34.0)
MCV: 79.6 fL (ref 78.0–100.0)
Monocytes Absolute: 0.6 10*3/uL (ref 0.1–1.0)
Platelets: 144 10*3/uL — ABNORMAL LOW (ref 150–400)
RDW: 16.5 % — ABNORMAL HIGH (ref 11.5–15.5)

## 2012-04-29 LAB — COMPREHENSIVE METABOLIC PANEL
ALT: 15 U/L (ref 0–53)
AST: 18 U/L (ref 0–37)
Calcium: 8.6 mg/dL (ref 8.4–10.5)
Creatinine, Ser: 1.45 mg/dL — ABNORMAL HIGH (ref 0.50–1.35)
GFR calc Af Amer: 51 mL/min — ABNORMAL LOW (ref >90)
GFR calc non Af Amer: 44 mL/min — ABNORMAL LOW (ref >90)
Glucose, Bld: 181 mg/dL — ABNORMAL HIGH (ref 70–99)
Sodium: 141 mEq/L (ref 135–145)
Total Protein: 6.1 g/dL (ref 6.0–8.3)

## 2012-04-29 LAB — POCT I-STAT TROPONIN I

## 2012-04-29 LAB — URINE MICROSCOPIC-ADD ON

## 2012-04-29 LAB — PROTIME-INR
INR: 4.73 — ABNORMAL HIGH (ref 0.00–1.49)
Prothrombin Time: 41.4 seconds — ABNORMAL HIGH (ref 11.6–15.2)

## 2012-04-29 LAB — BASIC METABOLIC PANEL
CO2: 26 mEq/L (ref 19–32)
Calcium: 8.7 mg/dL (ref 8.4–10.5)
Creatinine, Ser: 1.33 mg/dL (ref 0.50–1.35)
GFR calc non Af Amer: 49 mL/min — ABNORMAL LOW (ref >90)
Glucose, Bld: 78 mg/dL (ref 70–99)

## 2012-04-29 LAB — OCCULT BLOOD, POC DEVICE: Fecal Occult Bld: POSITIVE — AB

## 2012-04-29 MED ORDER — DEXTROSE 5 % AND 0.9 % NACL IV BOLUS
1000.0000 mL | INTRAVENOUS | Status: DC
Start: 2012-04-29 — End: 2012-04-29

## 2012-04-29 MED ORDER — SODIUM CHLORIDE 0.9 % IV SOLN
INTRAVENOUS | Status: DC
  Administered 2012-05-02: 10 mL/h via INTRAVENOUS

## 2012-04-29 MED ORDER — ISOSORBIDE MONONITRATE ER 60 MG PO TB24
60.0000 mg | ORAL_TABLET | Freq: Every morning | ORAL | Status: DC
  Administered 2012-04-30 – 2012-05-05 (×6): 60 mg via ORAL
  Filled 2012-04-29 (×6): qty 1

## 2012-04-29 MED ORDER — SODIUM CHLORIDE 0.9 % IV SOLN
INTRAVENOUS | Status: DC
  Administered 2012-04-29: 22:00:00 via INTRAVENOUS

## 2012-04-29 MED ORDER — ONDANSETRON HCL 4 MG/2ML IJ SOLN
4.0000 mg | Freq: Once | INTRAMUSCULAR | Status: AC
  Administered 2012-04-29: 4 mg via INTRAVENOUS
  Filled 2012-04-29: qty 2

## 2012-04-29 MED ORDER — SODIUM CHLORIDE 0.9 % IV SOLN
1000.0000 mL | INTRAVENOUS | Status: DC
  Administered 2012-04-29: 1000 mL via INTRAVENOUS

## 2012-04-29 MED ORDER — HYDROMORPHONE HCL PF 1 MG/ML IJ SOLN
0.5000 mg | Freq: Once | INTRAMUSCULAR | Status: AC
  Administered 2012-04-29: 0.5 mg via INTRAVENOUS
  Filled 2012-04-29: qty 1

## 2012-04-29 MED ORDER — INSULIN ASPART 100 UNIT/ML ~~LOC~~ SOLN
0.0000 [IU] | Freq: Three times a day (TID) | SUBCUTANEOUS | Status: DC
  Administered 2012-05-02 – 2012-05-03 (×3): 1 [IU] via SUBCUTANEOUS
  Administered 2012-05-04: 2 [IU] via SUBCUTANEOUS
  Administered 2012-05-05 (×2): 1 [IU] via SUBCUTANEOUS

## 2012-04-29 MED ORDER — ATORVASTATIN CALCIUM 20 MG PO TABS
20.0000 mg | ORAL_TABLET | Freq: Every day | ORAL | Status: DC
  Administered 2012-04-30 – 2012-05-05 (×6): 20 mg via ORAL
  Filled 2012-04-29 (×6): qty 1

## 2012-04-29 MED ORDER — DEXTROSE-NACL 5-0.9 % IV SOLN
INTRAVENOUS | Status: DC
  Administered 2012-04-29: 23:00:00 via INTRAVENOUS
  Administered 2012-04-30: 50 mL via INTRAVENOUS

## 2012-04-29 MED ORDER — AMIODARONE HCL 200 MG PO TABS
200.0000 mg | ORAL_TABLET | Freq: Every day | ORAL | Status: DC
  Administered 2012-04-30 – 2012-05-05 (×6): 200 mg via ORAL
  Filled 2012-04-29 (×6): qty 1

## 2012-04-29 MED ORDER — TAMSULOSIN HCL 0.4 MG PO CAPS
0.4000 mg | ORAL_CAPSULE | Freq: Every day | ORAL | Status: DC
  Administered 2012-04-29 – 2012-05-04 (×6): 0.4 mg via ORAL
  Filled 2012-04-29 (×7): qty 1

## 2012-04-29 MED ORDER — LEVOTHYROXINE SODIUM 50 MCG PO TABS
50.0000 ug | ORAL_TABLET | Freq: Every day | ORAL | Status: DC
  Administered 2012-04-30 – 2012-05-05 (×6): 50 ug via ORAL
  Filled 2012-04-29 (×6): qty 1

## 2012-04-29 MED ORDER — PANTOPRAZOLE SODIUM 40 MG IV SOLR
40.0000 mg | Freq: Two times a day (BID) | INTRAVENOUS | Status: DC
  Administered 2012-04-29 – 2012-05-01 (×4): 40 mg via INTRAVENOUS
  Filled 2012-04-29 (×6): qty 40

## 2012-04-29 MED ORDER — NITROGLYCERIN 0.4 MG SL SUBL: 0.4000 mg | SUBLINGUAL_TABLET | SUBLINGUAL | Status: DC | PRN

## 2012-04-29 MED ORDER — ALBUTEROL SULFATE HFA 108 (90 BASE) MCG/ACT IN AERS: 2.0000 | INHALATION_SPRAY | Freq: Four times a day (QID) | RESPIRATORY_TRACT | Status: DC | PRN

## 2012-04-29 MED ORDER — DEXTROSE 5 % AND 0.9 % NACL IV BOLUS: 50.0000 mL | INTRAVENOUS | Status: DC

## 2012-04-29 NOTE — ED Notes (Signed)
Results of lactic acid shown to Dr. Lockwood 

## 2012-04-29 NOTE — Progress Notes (Signed)
3.5x4 inch knots on left thigh from a bite on Thursday.

## 2012-04-29 NOTE — Progress Notes (Signed)
[name] [id number] Admission Data: 04/29/2012 7:10 PM Attending Provider: Robynn Pane, MD  [id number] Yetta Barre, MD Consults/ Treatment Team:    [name] is a 79 y.o. male patient admitted from ED awake, alert  & orientated  X 3,  Prior, VSS - Blood pressure 182/70, pulse 81, temperature 98.1 F (36.7 C), temperature source Oral, resp. rate 20, SpO2 97.00%., no c/o shortness of breath, no c/o chest pain, no distress noted. Tele # [id number] placed and pt is currently running:normal sinus rhythm.   IV site WDL:  wrist right, condition patent and no redness with a transparent dsg that's clean dry and intact.  Allergies:  No Known Allergies   Past Medical History  Diagnosis Date  . GERD (gastroesophageal reflux disease)   . Hypothyroidism   . Chronic back pain   . Stroke   . Gout     no problems in 2 years  . Pulmonary embolism, bilateral 2010  . Hyperlipemia   . Myocardial infarction 1987; 1989  . Anginal pain   . DVT, lower extremity 1980's    LLE  . Asthma     "touch"  . Pneumonia 1970's  . Renal disorder ~ 03/2011    "wasn't functioning right"  . Arthritis     "mostly in my knees and shoulders"  . Stroke     "just a little one"  . Collapsed lung     "for 14 years" right   . Diabetes mellitus   . Hypertension   . Depression   . Anxiety   . Hearing loss   . Glaucoma     History:  obtained from the patient. Tobacco/alcohol: denied none  Pt orientation to unit, room and routine. Information packet given to patient/family and safety video watched.  Admission INP armband ID verified with patient/family, and in place. SR up x 2, fall risk assessment complete with Patient and family verbalizing understanding of risks associated with falls. Pt verbalizes an understanding of how to use the call bell and to call for help before getting out of bed.  Skin, clean-dry- intact without evidence of bruising, or skin tears.   No evidence of skin break down noted on exam. no rashes,  no ecchymoses, no petechiae, no purpura    Will cont to monitor and assist as needed.  Cayton Cuevas Consuella Lose, RN 04/29/2012 7:10 PM

## 2012-04-29 NOTE — Consult Note (Signed)
Reason for Consult: Anemia, Melena, and Heme positive stool Referring Physician: Rinaldo Cloud, M.D.  [name] HPI: This is an 79 year old male who is well-known to me who is admitted for anemia, melena, and heme positive stool.  He is currently on coumadin for his history of PEs and he started to have intermittent melena for the past week.  He felt dizzy today and presented to the ER.  CBC notes an acute drop in his HGB from 14 down to 10 within the past month.  No reports of hematemesis, nausea, or vomiting.  He does complain of abdominal pain, but he has a history of chronic abdominal pain.  In 2011 he had a colonoscopy for hematochezia and he was identified to have several adenomas that required follow up in 5 years.  His EGD performed in 2012 for complaints of dysphagia was only significant for a hiatal hernia.  Past Medical History  Diagnosis Date  . GERD (gastroesophageal reflux disease)   . Hypothyroidism   . Chronic back pain   . Stroke   . Gout     no problems in 2 years  . Pulmonary embolism, bilateral 2010  . Hyperlipemia   . Myocardial infarction 1987; 1989  . Anginal pain   . DVT, lower extremity 1980's    LLE  . Asthma     "touch"  . Pneumonia 1970's  . Renal disorder ~ 03/2011    "wasn't functioning right"  . Arthritis     "mostly in my knees and shoulders"  . Stroke     "just a little one"  . Collapsed lung     "for 14 years" right   . Diabetes mellitus   . Hypertension   . Depression   . Anxiety   . Hearing loss   . Glaucoma     Past Surgical History  Procedure Laterality Date  . Esophagogastroduodenoscopy  12/22/2010    Procedure: ESOPHAGOGASTRODUODENOSCOPY (EGD);  Surgeon: Theda Belfast;  Location: [facility];  Service: Endoscopy;  Laterality: N/A;  . Appendectomy  1950  . Cataract extraction w/ intraocular lens  implant, bilateral Bilateral 1990's  . Cardiac catheterization    . Coronary angioplasty with stent placement      "think I have 4  total"  . Inguinal hernia repair Bilateral 03/28/2012    Procedure: LAPAROSCOPIC BILATERAL INGUINAL HERNIA REPAIR;  Surgeon: Ardeth Sportsman, MD;  Location: [facility];  Service: General;  Laterality: Bilateral;  . Insertion of mesh Bilateral 03/28/2012    Procedure: INSERTION OF MESH;  Surgeon: Ardeth Sportsman, MD;  Location: [facility];  Service: General;  Laterality: Bilateral;  . Hernia repair      Family History  Problem Relation Age of Onset  . Diabetes Mother   . Heart disease Father   . Lung cancer Brother   . Stroke Neg Hx   . Hypertension Neg Hx   . Hyperlipidemia Neg Hx   . Alcohol abuse Neg Hx     Social History:  reports that he quit smoking about 31 years ago. His smoking use included Cigarettes. He started smoking about 66 years ago. He has a 6 pack-year smoking history. He has never used smokeless tobacco. He reports that he uses illicit drugs (Hashish and Marijuana). He reports that he does not drink alcohol.  Allergies: No Known Allergies  Medications:  Scheduled:  Continuous: . sodium chloride 1,000 mL (04/29/12 1437)    Results for orders placed during the hospital encounter of 04/29/12 (  from the past 24 hour(s))  CBC WITH DIFFERENTIAL     Status: Abnormal   Collection Time    04/29/12 12:44 PM      Result Value Range   WBC 7.0  4.0 - 10.5 K/uL   RBC 3.48 (*) 4.22 - 5.81 MIL/uL   Hemoglobin 10.0 (*) 13.0 - 17.0 g/dL   HCT 16.1 (*) 09.6 - 04.5 %   MCV 79.6  78.0 - 100.0 fL   MCH 28.7  26.0 - 34.0 pg   MCHC 36.1 (*) 30.0 - 36.0 g/dL   RDW 40.9 (*) 81.1 - 91.4 %   Platelets 144 (*) 150 - 400 K/uL   Neutrophils Relative 77  43 - 77 %   Neutro Abs 5.4  1.7 - 7.7 K/uL   Lymphocytes Relative 15  12 - 46 %   Lymphs Abs 1.0  0.7 - 4.0 K/uL   Monocytes Relative 8  3 - 12 %   Monocytes Absolute 0.6  0.1 - 1.0 K/uL   Eosinophils Relative 0  0 - 5 %   Eosinophils Absolute 0.0  0.0 - 0.7 K/uL   Basophils Relative 0  0 - 1 %   Basophils Absolute 0.0  0.0 - 0.1 K/uL   COMPREHENSIVE METABOLIC PANEL     Status: Abnormal   Collection Time    04/29/12 12:44 PM      Result Value Range   Sodium 141  135 - 145 mEq/L   Potassium 3.5  3.5 - 5.1 mEq/L   Chloride 108  96 - 112 mEq/L   CO2 25  19 - 32 mEq/L   Glucose, Bld 181 (*) 70 - 99 mg/dL   BUN 21  6 - 23 mg/dL   Creatinine, Ser 7.82 (*) 0.50 - 1.35 mg/dL   Calcium 8.6  8.4 - 95.6 mg/dL   Total Protein 6.1  6.0 - 8.3 g/dL   Albumin 2.6 (*) 3.5 - 5.2 g/dL   AST 18  0 - 37 U/L   ALT 15  0 - 53 U/L   Alkaline Phosphatase 69  39 - 117 U/L   Total Bilirubin 0.3  0.3 - 1.2 mg/dL   GFR calc non Af Amer 44 (*) >90 mL/min   GFR calc Af Amer 51 (*) >90 mL/min  LIPASE, BLOOD     Status: None   Collection Time    04/29/12 12:44 PM      Result Value Range   Lipase 33  11 - 59 U/L  PROTIME-INR     Status: Abnormal   Collection Time    04/29/12 12:44 PM      Result Value Range   Prothrombin Time 41.4 (*) 11.6 - 15.2 seconds   INR 4.73 (*) 0.00 - 1.49  POCT I-STAT TROPONIN I     Status: None   Collection Time    04/29/12  1:02 PM      Result Value Range   Troponin i, poc 0.00  0.00 - 0.08 ng/mL   Comment 3           CG4 I-STAT (LACTIC ACID)     Status: None   Collection Time    04/29/12  1:03 PM      Result Value Range   Lactic Acid, Venous 1.33  0.5 - 2.2 mmol/L  OCCULT BLOOD, POC DEVICE     Status: Abnormal   Collection Time    04/29/12  2:01 PM      Result Value Range  Fecal Occult Bld POSITIVE (*) NEGATIVE     No results found.  ROS:  As stated above in the HPI otherwise negative.  Blood pressure 167/68, pulse 75, temperature 98.6 F (37 C), temperature source Oral, resp. rate 18, SpO2 99.00%.    PE: Gen: NAD, Alert and Oriented HEENT:  [place]/AT, EOMI Neck: Supple, no LAD Lungs: CTA Bilaterally CV: RRR without M/G/R ABM: Soft, NTND, +BS Ext: No C/C/E  Assessment/Plan: 1) Anemia. 2) Heme positive stool. 3) Melena. 4) Chronic abdominal pain.   With his constellation of symptoms  and findings an EGD is warranted at this time.  His INR is elevated and it can be helpful in this situation as the bleeding source could be identified.  Obtaining a history from the patient was difficult.  He is still concerned about his abdominal pain, but I have not been able to resolve this issue.  At one point he responded to treatment of his constipation with Amitiza, but then its efficacy waned.  Despite having normal bowel movements he continued to have abdominal pain.  I doubt I will be able to improve this issue and it does not appear that his inguinal hernia repair resolved his pain.  Plan: 1) EGD tomorrow. 2) Follow HGB. 3) Transfuse as necessary.  Maximilien Hayashi D 04/29/2012, 4:06 PM

## 2012-04-29 NOTE — H&P (Signed)
[name] is an 79 y.o. male.   Chief Complaint: Abdominal pain associated with black tarry stool/dizziness HPI: Patient is 79 year old male with past medical history significant for multiple medical problems i.e. coronary artery disease history of MI x2 in the past status post PTCA stenting to LAD left circumflex and RCA in the past ischemic cardiomyopathy history of congestive heart failure secondary to systolic dysfunction, history of bilateral pulmonary embolism in the past history of paroxysmal atrial fibrillation, hypertension, non-insulin-dependent diabetes mellitus, history of DVT, history of remote tobacco abuse, chronic kidney disease, stage III hypothyroidism, history pancreatitis in the past, GERD, came to ER complaining of vague generalized abdominal pain associated with black tarry stool and feeling dizzy and presyncopal episode. Patient was noted to have hemoglobin of 10 g last hemoglobin last month for this 14.5. Patient denies any chest pain nausea vomiting or diaphoresis. Denies any nonsteroidal anti-inflammatory medications. Denies palpitation lightheadedness but feels dizzy. Patient was noted to have INR of 4 yesterday was advised to hold Coumadin for 2 days and reduce the Coumadin dose to 4 mg 6 days per week and today was noted to have INR of about 4.7.   Past Medical History  Diagnosis Date  . GERD (gastroesophageal reflux disease)   . Hypothyroidism   . Chronic back pain   . Stroke   . Gout     no problems in 2 years  . Pulmonary embolism, bilateral 2010  . Hyperlipemia   . Myocardial infarction 1987; 1989  . Anginal pain   . DVT, lower extremity 1980's    LLE  . Asthma     "touch"  . Pneumonia 1970's  . Renal disorder ~ 03/2011    "wasn't functioning right"  . Arthritis     "mostly in my knees and shoulders"  . Stroke     "just a little one"  . Collapsed lung     "for 14 years" right   . Diabetes mellitus   . Hypertension   . Depression   . Anxiety   .  Hearing loss   . Glaucoma     Past Surgical History  Procedure Laterality Date  . Esophagogastroduodenoscopy  12/22/2010    Procedure: ESOPHAGOGASTRODUODENOSCOPY (EGD);  Surgeon: Theda Belfast;  Location: [facility];  Service: Endoscopy;  Laterality: N/A;  . Appendectomy  1950  . Cataract extraction w/ intraocular lens  implant, bilateral Bilateral 1990's  . Cardiac catheterization    . Coronary angioplasty with stent placement      "think I have 4 total"  . Inguinal hernia repair Bilateral 03/28/2012    Procedure: LAPAROSCOPIC BILATERAL INGUINAL HERNIA REPAIR;  Surgeon: Ardeth Sportsman, MD;  Location: [facility];  Service: General;  Laterality: Bilateral;  . Insertion of mesh Bilateral 03/28/2012    Procedure: INSERTION OF MESH;  Surgeon: Ardeth Sportsman, MD;  Location: [facility];  Service: General;  Laterality: Bilateral;  . Hernia repair      Family History  Problem Relation Age of Onset  . Diabetes Mother   . Heart disease Father   . Lung cancer Brother   . Stroke Neg Hx   . Hypertension Neg Hx   . Hyperlipidemia Neg Hx   . Alcohol abuse Neg Hx    Social History:  reports that he quit smoking about 31 years ago. His smoking use included Cigarettes. He started smoking about 66 years ago. He has a 6 pack-year smoking history. He has never used smokeless tobacco. He reports that he uses  illicit drugs (Hashish and Marijuana). He reports that he does not drink alcohol.  Allergies: No Known Allergies   (Not in a hospital admission)  Results for orders placed during the hospital encounter of 04/29/12 (from the past 48 hour(s))  CBC WITH DIFFERENTIAL     Status: Abnormal   Collection Time    04/29/12 12:44 PM      Result Value Range   WBC 7.0  4.0 - 10.5 K/uL   RBC 3.48 (*) 4.22 - 5.81 MIL/uL   Hemoglobin 10.0 (*) 13.0 - 17.0 g/dL   HCT 47.8 (*) 29.5 - 62.1 %   MCV 79.6  78.0 - 100.0 fL   MCH 28.7  26.0 - 34.0 pg   MCHC 36.1 (*) 30.0 - 36.0 g/dL   RDW 30.8 (*) 65.7 - 84.6 %    Platelets 144 (*) 150 - 400 K/uL   Neutrophils Relative 77  43 - 77 %   Neutro Abs 5.4  1.7 - 7.7 K/uL   Lymphocytes Relative 15  12 - 46 %   Lymphs Abs 1.0  0.7 - 4.0 K/uL   Monocytes Relative 8  3 - 12 %   Monocytes Absolute 0.6  0.1 - 1.0 K/uL   Eosinophils Relative 0  0 - 5 %   Eosinophils Absolute 0.0  0.0 - 0.7 K/uL   Basophils Relative 0  0 - 1 %   Basophils Absolute 0.0  0.0 - 0.1 K/uL  COMPREHENSIVE METABOLIC PANEL     Status: Abnormal   Collection Time    04/29/12 12:44 PM      Result Value Range   Sodium 141  135 - 145 mEq/L   Potassium 3.5  3.5 - 5.1 mEq/L   Chloride 108  96 - 112 mEq/L   CO2 25  19 - 32 mEq/L   Glucose, Bld 181 (*) 70 - 99 mg/dL   BUN 21  6 - 23 mg/dL   Creatinine, Ser 9.62 (*) 0.50 - 1.35 mg/dL   Calcium 8.6  8.4 - 95.2 mg/dL   Total Protein 6.1  6.0 - 8.3 g/dL   Albumin 2.6 (*) 3.5 - 5.2 g/dL   AST 18  0 - 37 U/L   ALT 15  0 - 53 U/L   Alkaline Phosphatase 69  39 - 117 U/L   Total Bilirubin 0.3  0.3 - 1.2 mg/dL   GFR calc non Af Amer 44 (*) >90 mL/min   GFR calc Af Amer 51 (*) >90 mL/min   Comment:            The eGFR has been calculated     using the CKD EPI equation.     This calculation has not been     validated in all clinical     situations.     eGFR's persistently     <90 mL/min signify     possible Chronic Kidney Disease.  LIPASE, BLOOD     Status: None   Collection Time    04/29/12 12:44 PM      Result Value Range   Lipase 33  11 - 59 U/L  PROTIME-INR     Status: Abnormal   Collection Time    04/29/12 12:44 PM      Result Value Range   Prothrombin Time 41.4 (*) 11.6 - 15.2 seconds   INR 4.73 (*) 0.00 - 1.49  POCT I-STAT TROPONIN I     Status: None   Collection Time  04/29/12  1:02 PM      Result Value Range   Troponin i, poc 0.00  0.00 - 0.08 ng/mL   Comment 3            Comment: Due to the release kinetics of cTnI,     a negative result within the first hours     of the onset of symptoms does not rule out      myocardial infarction with certainty.     If myocardial infarction is still suspected,     repeat the test at appropriate intervals.  CG4 I-STAT (LACTIC ACID)     Status: None   Collection Time    04/29/12  1:03 PM      Result Value Range   Lactic Acid, Venous 1.33  0.5 - 2.2 mmol/L  OCCULT BLOOD, POC DEVICE     Status: Abnormal   Collection Time    04/29/12  2:01 PM      Result Value Range   Fecal Occult Bld POSITIVE (*) NEGATIVE   No results found.  Review of Systems  Constitutional: Negative for fever, chills and weight loss.  Respiratory: Negative for cough, hemoptysis and sputum production.   Cardiovascular: Negative for chest pain, palpitations, orthopnea and claudication.  Gastrointestinal: Positive for abdominal pain and melena. Negative for diarrhea.  Genitourinary: Negative for dysuria.  Neurological: Positive for dizziness. Negative for headaches.    Blood pressure 162/67, pulse 81, temperature 98.6 F (37 C), temperature source Oral, resp. rate 29, SpO2 97.00%. Physical Exam  Constitutional: He is oriented to person, place, and time.  HENT:  Head: Normocephalic and atraumatic.  Eyes: Conjunctivae are normal. Pupils are equal, round, and reactive to light. Left eye exhibits no discharge. No scleral icterus.  Neck: Normal range of motion. Neck supple. No JVD present. No tracheal deviation present. No thyromegaly present.  Cardiovascular: Normal rate and regular rhythm.   Murmur (Soft systolic murmur noted no S3 gallop) heard. Respiratory: Effort normal and breath sounds normal. No respiratory distress. He has no wheezes. He has no rales.  GI: Soft. Bowel sounds are normal. He exhibits no distension. There is tenderness (Mild generalized tenderness no guarding). There is no rebound and no guarding.  Musculoskeletal:  No clubbing cyanosis 1+ edema noted  Neurological: He is alert and oriented to person, place, and time.     Assessment/Plan acute upper GI  bleeding Coumadin toxicity Acute anemia secondary to 1 History of PE in the past History of DVT in the past History of paroxysmal A. fib in the past Hypertension Diabetes mellitus CAD status post multivessel PCI in the past Ischemic cardiomyopathy History of systolic heart failure in the past Hypercholesteremia Remote tobacco abuse Chronic kidney status E. stage III Hypothyroidism History pancreatitis in the past GERD Status post bilateral inguinal hernia repair last month Plan As per orders GI consult Hold Coumadin   Asenath Balash N 04/29/2012, 3:29 PM

## 2012-04-29 NOTE — ED Provider Notes (Signed)
History     CSN: [id number]  Arrival date & time 04/29/12  1205   First MD Initiated Contact with Patient 04/29/12 1210      Chief Complaint  Patient presents with  . Abdominal Pain     HPI  The patient presents with concern of abdominal pain, nausea.  He states that he has had similar pain in 4 months, including prior to and following a scheduled bilateral inguinal hernia repair.  He denies new aspects of the pain, but states it is more severe, though improved with home medication use. No new vomiting, but there is nausea and some loss of appetite.  The patient complains of intermittent constipation and loose black stool. No new syncope, no chest pain, new dyspnea  Past Medical History  Diagnosis Date  . GERD (gastroesophageal reflux disease)   . Hypothyroidism   . Chronic back pain   . Stroke   . Gout     no problems in 2 years  . Pulmonary embolism, bilateral 2010  . Hyperlipemia   . Myocardial infarction 1987; 1989  . Anginal pain   . DVT, lower extremity 1980's    LLE  . Asthma     "touch"  . Pneumonia 1970's  . Renal disorder ~ 03/2011    "wasn't functioning right"  . Arthritis     "mostly in my knees and shoulders"  . Stroke     "just a little one"  . Collapsed lung     "for 14 years" right   . Diabetes mellitus   . Hypertension   . Depression   . Anxiety   . Hearing loss   . Glaucoma     Past Surgical History  Procedure Laterality Date  . Esophagogastroduodenoscopy  12/22/2010    Procedure: ESOPHAGOGASTRODUODENOSCOPY (EGD);  Surgeon: Theda Belfast;  Location: [facility];  Service: Endoscopy;  Laterality: N/A;  . Appendectomy  1950  . Cataract extraction w/ intraocular lens  implant, bilateral Bilateral 1990's  . Cardiac catheterization    . Coronary angioplasty with stent placement      "think I have 4 total"  . Inguinal hernia repair Bilateral 03/28/2012    Procedure: LAPAROSCOPIC BILATERAL INGUINAL HERNIA REPAIR;  Surgeon: Ardeth Sportsman, MD;   Location: [facility];  Service: General;  Laterality: Bilateral;  . Insertion of mesh Bilateral 03/28/2012    Procedure: INSERTION OF MESH;  Surgeon: Ardeth Sportsman, MD;  Location: [facility];  Service: General;  Laterality: Bilateral;  . Hernia repair      Family History  Problem Relation Age of Onset  . Diabetes Mother   . Heart disease Father   . Lung cancer Brother   . Stroke Neg Hx   . Hypertension Neg Hx   . Hyperlipidemia Neg Hx   . Alcohol abuse Neg Hx     History  Substance Use Topics  . Smoking status: Former Smoker -- 2.00 packs/day for 3 years    Types: Cigarettes    Start date: 01/27/1946    Quit date: 01/22/1981  . Smokeless tobacco: Never Used  . Alcohol Use: No     Comment: 07/12/2011; "last drink of alcohol was ~ 1983"      Review of Systems  Constitutional:       Per HPI, otherwise negative  HENT:       Per HPI, otherwise negative  Respiratory:       Per HPI, otherwise negative  Cardiovascular:       Per HPI,  otherwise negative  Gastrointestinal: Positive for nausea, abdominal pain, diarrhea and constipation. Negative for vomiting and abdominal distention.  Endocrine:       Negative aside from HPI  Genitourinary:       Neg aside from HPI   Musculoskeletal:       Per HPI, otherwise negative  Skin: Negative.   Neurological: Negative for syncope.    Allergies  Review of patient's allergies indicates no known allergies.  Home Medications   Current Outpatient Rx  Name  Route  Sig  Dispense  Refill  . acetaminophen (TYLENOL) 500 MG tablet   Oral   Take 500 mg by mouth every 6 (six) hours as needed for pain.         Marland Kitchen albuterol (PROVENTIL HFA;VENTOLIN HFA) 108 (90 BASE) MCG/ACT inhaler   Inhalation   Inhale 2 puffs into the lungs every 6 (six) hours as needed. For shortness of breath         . ALPRAZolam (XANAX) 0.25 MG tablet   Oral   Take 0.25 mg by mouth at bedtime as needed. For sleep         . amiodarone (PACERONE) 200 MG tablet    Oral   Take 200 mg by mouth daily.          Marland Kitchen aspirin 81 MG tablet   Oral   Take 81 mg by mouth daily.         Marland Kitchen dexlansoprazole (DEXILANT) 60 MG capsule   Oral   Take 60 mg by mouth daily.          . furosemide (LASIX) 40 MG tablet   Oral   Take 20 mg by mouth daily at 12 noon.          Marland Kitchen glimepiride (AMARYL) 1 MG tablet   Oral   Take 1 mg by mouth daily before breakfast.          . isosorbide mononitrate (IMDUR) 60 MG 24 hr tablet   Oral   Take 60 mg by mouth every morning.          Marland Kitchen levothyroxine (SYNTHROID, LEVOTHROID) 50 MCG tablet   Oral   Take 50 mcg by mouth daily at 12 noon.          . lubiprostone (AMITIZA) 24 MCG capsule   Oral   Take 24 mcg by mouth 2 (two) times daily with a meal.         . nitroGLYCERIN (NITROSTAT) 0.4 MG SL tablet   Sublingual   Place 0.4 mg under the tongue every 5 (five) minutes as needed for chest pain.         Marland Kitchen oxyCODONE (OXY IR/ROXICODONE) 5 MG immediate release tablet   Oral   Take 1-2 tablets (5-10 mg total) by mouth every 4 (four) hours as needed for pain.   50 tablet   0   . ranitidine (ZANTAC) 300 MG capsule   Oral   Take 300 mg by mouth every evening.         . rosuvastatin (CRESTOR) 10 MG tablet   Oral   Take 1 tablet (10 mg total) by mouth daily.   30 tablet   1   . Tamsulosin HCl (FLOMAX) 0.4 MG CAPS   Oral   Take 0.4 mg by mouth at bedtime.          . traMADol (ULTRAM) 50 MG tablet   Oral   Take 50 mg by mouth every 6 (six) hours as  needed. For pain         . warfarin (COUMADIN) 4 MG tablet   Oral   Take 1 tablet (4 mg total) by mouth daily at 6 PM.   35 tablet   3     BP 162/67  Pulse 81  Temp(Src) 98.6 F (37 C) (Oral)  Resp 29  SpO2 97%  Physical Exam  Nursing note and vitals reviewed. Constitutional: He is oriented to person, place, and time. He appears well-developed. No distress.  HENT:  Head: Normocephalic and atraumatic.  Eyes: Conjunctivae and EOM are  normal.  Cardiovascular: Normal rate and regular rhythm.   Pulmonary/Chest: Effort normal. No stridor. No respiratory distress.  Abdominal: Soft. Bowel sounds are normal. He exhibits no distension. There is no tenderness. There is no rebound.  Abdomen soft, there is no appreciable lesion in either inguinal crease.  Genitourinary: Rectal exam shows no external hemorrhoid, no internal hemorrhoid, no fissure, no mass, no tenderness and anal tone normal. Guaiac positive stool.  Musculoskeletal: He exhibits no edema.  Neurological: He is alert and oriented to person, place, and time.  Skin: Skin is warm and dry.  Psychiatric: He has a normal mood and affect.    ED Course  Procedures (including critical care time)  Labs Reviewed  CBC WITH DIFFERENTIAL - Abnormal; Notable for the following:    RBC 3.48 (*)    Hemoglobin 10.0 (*)    HCT 27.7 (*)    MCHC 36.1 (*)    RDW 16.5 (*)    Platelets 144 (*)    All other components within normal limits  COMPREHENSIVE METABOLIC PANEL - Abnormal; Notable for the following:    Glucose, Bld 181 (*)    Creatinine, Ser 1.45 (*)    Albumin 2.6 (*)    GFR calc non Af Amer 44 (*)    GFR calc Af Amer 51 (*)    All other components within normal limits  PROTIME-INR - Abnormal; Notable for the following:    Prothrombin Time 41.4 (*)    INR 4.73 (*)    All other components within normal limits  OCCULT BLOOD, POC DEVICE - Abnormal; Notable for the following:    Fecal Occult Bld POSITIVE (*)    All other components within normal limits  LIPASE, BLOOD  URINALYSIS, ROUTINE W REFLEX MICROSCOPIC  CG4 I-STAT (LACTIC ACID)  POCT I-STAT TROPONIN I  POCT GASTRIC OCCULT BLOOD (1-CARD TO LAB)   No results found.   No diagnosis found.  Pulse ox 99% normal  On repeat examination appears calm.  His labs are notable for supratherapeutic INR, hemoglobin drop of 4 g since last blood draw.  MDM  This patient presents with mild complaints, but seemed consistent  with postoperative pain, and on exam has soft abdomen with good bowel sounds.  He is in no distress, afebrile.  More notably the patient has a 4 g hemoglobin drop, and is supratherapeutic with his anticoagulant.  She is also Hemoccult positive.  Given his ongoing GI bleed with coagulopathy he was admitted for further evaluation and management.  Absent distress, emergent anticoagulation reversal was not indicated.       Gerhard Munch, MD 04/29/12 [phone]

## 2012-04-29 NOTE — ED Notes (Signed)
Pt to department via EMS from an independent living facility. Pt reports he had hernia surgery on March 7th, states he has intermittent nausea and abd pain since then. States he is currently not having any pain, but states when it come it's bad. Pt was ambulatory to the stretcher. Bp-160/76 Hr-80

## 2012-04-30 ENCOUNTER — Encounter (HOSPITAL_COMMUNITY)
Admission: EM | Disposition: A | Discharge status: Home / Self Care | Payer: Self-pay | Source: Home / Self Care | Attending: Cardiology

## 2012-04-30 ENCOUNTER — Encounter (HOSPITAL_COMMUNITY): Payer: Self-pay

## 2012-04-30 HISTORY — PX: ESOPHAGOGASTRODUODENOSCOPY: SHX5428

## 2012-04-30 LAB — CBC
HCT: 27.1 % — ABNORMAL LOW (ref 39.0–52.0)
MCHC: 36.2 g/dL — ABNORMAL HIGH (ref 30.0–36.0)
Platelets: 156 10*3/uL (ref 150–400)
RDW: 16.8 % — ABNORMAL HIGH (ref 11.5–15.5)
WBC: 6 10*3/uL (ref 4.0–10.5)

## 2012-04-30 LAB — GLUCOSE, CAPILLARY
Glucose-Capillary: 93 mg/dL (ref 70–99)
Glucose-Capillary: 83 mg/dL (ref 70–99)
Glucose-Capillary: 85 mg/dL (ref 70–99)

## 2012-04-30 LAB — BASIC METABOLIC PANEL
BUN: 18 mg/dL (ref 6–23)
GFR calc Af Amer: 56 mL/min — ABNORMAL LOW (ref >90)
GFR calc non Af Amer: 48 mL/min — ABNORMAL LOW (ref >90)
Potassium: 3.7 mEq/L (ref 3.5–5.1)
Sodium: 145 mEq/L (ref 135–145)

## 2012-04-30 LAB — PROTIME-INR: Prothrombin Time: 40.3 seconds — ABNORMAL HIGH (ref 11.6–15.2)

## 2012-04-30 LAB — HEMATOCRIT: HCT: 26.3 % — ABNORMAL LOW (ref 39.0–52.0)

## 2012-04-30 LAB — APTT: aPTT: 71 seconds — ABNORMAL HIGH (ref 24–37)

## 2012-04-30 LAB — HEMOGLOBIN: Hemoglobin: 9.4 g/dL — ABNORMAL LOW (ref 13.0–17.0)

## 2012-04-30 SURGERY — EGD (ESOPHAGOGASTRODUODENOSCOPY)
Anesthesia: Moderate Sedation

## 2012-04-30 MED ORDER — ALUM & MAG HYDROXIDE-SIMETH 200-200-20 MG/5ML PO SUSP
30.0000 mL | Freq: Four times a day (QID) | ORAL | Status: DC | PRN
  Administered 2012-04-30 – 2012-05-01 (×2): 30 mL via ORAL
  Filled 2012-04-30 (×3): qty 30

## 2012-04-30 MED ORDER — PEG 3350-KCL-NA BICARB-NACL 420 G PO SOLR
4000.0000 mL | Freq: Once | ORAL | Status: AC
  Administered 2012-04-30: 4000 mL via ORAL
  Filled 2012-04-30 (×2): qty 4000

## 2012-04-30 MED ORDER — MIDAZOLAM HCL 10 MG/2ML IJ SOLN
INTRAMUSCULAR | Status: DC | PRN
  Administered 2012-04-30 (×3): 1 mg via INTRAVENOUS

## 2012-04-30 MED ORDER — ONDANSETRON HCL 4 MG/2ML IJ SOLN
2.0000 mg | Freq: Four times a day (QID) | INTRAMUSCULAR | Status: DC | PRN
  Administered 2012-05-02 – 2012-05-03 (×2): 2 mg via INTRAVENOUS
  Filled 2012-04-30 (×2): qty 2

## 2012-04-30 MED ORDER — MIDAZOLAM HCL 5 MG/ML IJ SOLN
INTRAMUSCULAR | Status: AC
  Filled 2012-04-30: qty 2

## 2012-04-30 MED ORDER — FENTANYL CITRATE 0.05 MG/ML IJ SOLN
INTRAMUSCULAR | Status: AC
  Filled 2012-04-30: qty 4

## 2012-04-30 MED ORDER — PHYTONADIONE 5 MG PO TABS
5.0000 mg | ORAL_TABLET | Freq: Once | ORAL | Status: AC
  Administered 2012-04-30: 5 mg via ORAL
  Filled 2012-04-30: qty 1

## 2012-04-30 MED ORDER — SODIUM CHLORIDE 0.9 % IV SOLN
INTRAVENOUS | Status: DC
  Administered 2012-04-30: 1000 mL via INTRAVENOUS

## 2012-04-30 MED ORDER — FENTANYL CITRATE 0.05 MG/ML IJ SOLN
INTRAMUSCULAR | Status: DC | PRN
  Administered 2012-04-30 (×2): 25 ug via INTRAVENOUS

## 2012-04-30 NOTE — Progress Notes (Signed)
Utilization review completed. Naveh Rickles, RN, BSN. 

## 2012-04-30 NOTE — Care Management Note (Signed)
    Page 1 of 2   05/02/2012     2:43:18 PM   CARE MANAGEMENT NOTE 05/02/2012  Patient:  [facility]   Account Number:  [id number]  Date Initiated:  04/30/2012  Documentation initiated by:  Letha Cape  Subjective/Objective Assessment:   dx gib  admit- [facility].  patient uses a cane.     Action/Plan:   needs pt eval   Anticipated DC Date:  05/03/2012   Anticipated DC Plan:  HOME W HOME HEALTH SERVICES      DC Planning Services  CM consult      [facility] Choice  HOME HEALTH  DURABLE MEDICAL EQUIPMENT   Choice offered to / List presented to:  C-4 Adult Children   DME arranged  [facility]      DME agency  [facility]     HH arranged  HH-2 PT  HH-3 OT  HH-4 NURSE'S AIDE      HH agency  [facility]   Status of service:  Completed, signed off Medicare Important Message given?   (If response is "NO", the following Medicare IM given date fields will be blank) Date Medicare IM given:   Date Additional Medicare IM given:    Discharge Disposition:  HOME W HOME HEALTH SERVICES  Per UR Regulation:  Reviewed for med. necessity/level of care/duration of stay  If discussed at Long Length of Stay Meetings, dates discussed:    Comments:  05/02/12 14:41 Letha Cape RN, BSN [phone] patient for discharge tomorrow, referral made to [facility] for HHPT,OT, and aide.  Soc will begin 24-48 hrs post discharge.  04/30/12 15:44 Letha Cape RN,BSN [phone] patient lives [facility], he uses a cane.  Patient states he can transport via car, he has medication coverage and transportation at dc.  Will give private duty list to daughter, Constance Goltz,  while she is here also she states if he has any hh services needs at dc would like to work with [facility].  NCM will continue to follow for dc needs.  Patient will need pt eval.

## 2012-04-30 NOTE — H&P (View-Only) (Signed)
Reason for Consult: Anemia, Melena, and Heme positive stool Referring Physician: Mohan Harwani, M.D.  [name] HPI: This is an 79 year old male who is well-known to me who is admitted for anemia, melena, and heme positive stool.  He is currently on coumadin for his history of PEs and he started to have intermittent melena for the past week.  He felt dizzy today and presented to the ER.  CBC notes an acute drop in his HGB from 14 down to 10 within the past month.  No reports of hematemesis, nausea, or vomiting.  He does complain of abdominal pain, but he has a history of chronic abdominal pain.  In 2011 he had a colonoscopy for hematochezia and he was identified to have several adenomas that required follow up in 5 years.  His EGD performed in 2012 for complaints of dysphagia was only significant for a hiatal hernia.  Past Medical History  Diagnosis Date  . GERD (gastroesophageal reflux disease)   . Hypothyroidism   . Chronic back pain   . Stroke   . Gout     no problems in 2 years  . Pulmonary embolism, bilateral 2010  . Hyperlipemia   . Myocardial infarction 1987; 1989  . Anginal pain   . DVT, lower extremity 1980's    LLE  . Asthma     "touch"  . Pneumonia 1970's  . Renal disorder ~ 03/2011    "wasn't functioning right"  . Arthritis     "mostly in my knees and shoulders"  . Stroke     "just a little one"  . Collapsed lung     "for 14 years" right   . Diabetes mellitus   . Hypertension   . Depression   . Anxiety   . Hearing loss   . Glaucoma     Past Surgical History  Procedure Laterality Date  . Esophagogastroduodenoscopy  12/22/2010    Procedure: ESOPHAGOGASTRODUODENOSCOPY (EGD);  Surgeon: Rodricus Candelaria D Tamilyn Lupien;  Location: [facility];  Service: Endoscopy;  Laterality: N/A;  . Appendectomy  1950  . Cataract extraction w/ intraocular lens  implant, bilateral Bilateral 1990's  . Cardiac catheterization    . Coronary angioplasty with stent placement      "think I have 4  total"  . Inguinal hernia repair Bilateral 03/28/2012    Procedure: LAPAROSCOPIC BILATERAL INGUINAL HERNIA REPAIR;  Surgeon: Steven C. Gross, MD;  Location: [facility];  Service: General;  Laterality: Bilateral;  . Insertion of mesh Bilateral 03/28/2012    Procedure: INSERTION OF MESH;  Surgeon: Steven C. Gross, MD;  Location: [facility];  Service: General;  Laterality: Bilateral;  . Hernia repair      Family History  Problem Relation Age of Onset  . Diabetes Mother   . Heart disease Father   . Lung cancer Brother   . Stroke Neg Hx   . Hypertension Neg Hx   . Hyperlipidemia Neg Hx   . Alcohol abuse Neg Hx     Social History:  reports that he quit smoking about 31 years ago. His smoking use included Cigarettes. He started smoking about 66 years ago. He has a 6 pack-year smoking history. He has never used smokeless tobacco. He reports that he uses illicit drugs (Hashish and Marijuana). He reports that he does not drink alcohol.  Allergies: No Known Allergies  Medications:  Scheduled:  Continuous: . sodium chloride 1,000 mL (04/29/12 1437)    Results for orders placed during the hospital encounter of 04/29/12 (  from the past 24 hour(s))  CBC WITH DIFFERENTIAL     Status: Abnormal   Collection Time    04/29/12 12:44 PM      Result Value Range   WBC 7.0  4.0 - 10.5 K/uL   RBC 3.48 (*) 4.22 - 5.81 MIL/uL   Hemoglobin 10.0 (*) 13.0 - 17.0 g/dL   HCT 27.7 (*) 39.0 - 52.0 %   MCV 79.6  78.0 - 100.0 fL   MCH 28.7  26.0 - 34.0 pg   MCHC 36.1 (*) 30.0 - 36.0 g/dL   RDW 16.5 (*) 11.5 - 15.5 %   Platelets 144 (*) 150 - 400 K/uL   Neutrophils Relative 77  43 - 77 %   Neutro Abs 5.4  1.7 - 7.7 K/uL   Lymphocytes Relative 15  12 - 46 %   Lymphs Abs 1.0  0.7 - 4.0 K/uL   Monocytes Relative 8  3 - 12 %   Monocytes Absolute 0.6  0.1 - 1.0 K/uL   Eosinophils Relative 0  0 - 5 %   Eosinophils Absolute 0.0  0.0 - 0.7 K/uL   Basophils Relative 0  0 - 1 %   Basophils Absolute 0.0  0.0 - 0.1 K/uL   COMPREHENSIVE METABOLIC PANEL     Status: Abnormal   Collection Time    04/29/12 12:44 PM      Result Value Range   Sodium 141  135 - 145 mEq/L   Potassium 3.5  3.5 - 5.1 mEq/L   Chloride 108  96 - 112 mEq/L   CO2 25  19 - 32 mEq/L   Glucose, Bld 181 (*) 70 - 99 mg/dL   BUN 21  6 - 23 mg/dL   Creatinine, Ser 1.45 (*) 0.50 - 1.35 mg/dL   Calcium 8.6  8.4 - 10.5 mg/dL   Total Protein 6.1  6.0 - 8.3 g/dL   Albumin 2.6 (*) 3.5 - 5.2 g/dL   AST 18  0 - 37 U/L   ALT 15  0 - 53 U/L   Alkaline Phosphatase 69  39 - 117 U/L   Total Bilirubin 0.3  0.3 - 1.2 mg/dL   GFR calc non Af Amer 44 (*) >90 mL/min   GFR calc Af Amer 51 (*) >90 mL/min  LIPASE, BLOOD     Status: None   Collection Time    04/29/12 12:44 PM      Result Value Range   Lipase 33  11 - 59 U/L  PROTIME-INR     Status: Abnormal   Collection Time    04/29/12 12:44 PM      Result Value Range   Prothrombin Time 41.4 (*) 11.6 - 15.2 seconds   INR 4.73 (*) 0.00 - 1.49  POCT I-STAT TROPONIN I     Status: None   Collection Time    04/29/12  1:02 PM      Result Value Range   Troponin i, poc 0.00  0.00 - 0.08 ng/mL   Comment 3           CG4 I-STAT (LACTIC ACID)     Status: None   Collection Time    04/29/12  1:03 PM      Result Value Range   Lactic Acid, Venous 1.33  0.5 - 2.2 mmol/L  OCCULT BLOOD, POC DEVICE     Status: Abnormal   Collection Time    04/29/12  2:01 PM      Result Value Range     Fecal Occult Bld POSITIVE (*) NEGATIVE     No results found.  ROS:  As stated above in the HPI otherwise negative.  Blood pressure 167/68, pulse 75, temperature 98.6 F (37 C), temperature source Oral, resp. rate 18, SpO2 99.00%.    PE: Gen: NAD, Alert and Oriented HEENT:  /AT, EOMI Neck: Supple, no LAD Lungs: CTA Bilaterally CV: RRR without M/G/R ABM: Soft, NTND, +BS Ext: No C/C/E  Assessment/Plan: 1) Anemia. 2) Heme positive stool. 3) Melena. 4) Chronic abdominal pain.   With his constellation of symptoms  and findings an EGD is warranted at this time.  His INR is elevated and it can be helpful in this situation as the bleeding source could be identified.  Obtaining a history from the patient was difficult.  He is still concerned about his abdominal pain, but I have not been able to resolve this issue.  At one point he responded to treatment of his constipation with Amitiza, but then its efficacy waned.  Despite having normal bowel movements he continued to have abdominal pain.  I doubt I will be able to improve this issue and it does not appear that his inguinal hernia repair resolved his pain.  Plan: 1) EGD tomorrow. 2) Follow HGB. 3) Transfuse as necessary.  Elaria Osias D 04/29/2012, 4:06 PM      

## 2012-04-30 NOTE — Progress Notes (Signed)
Subjective:   patient denies any chest pain or shortness of breath states abdominal pain is improved. No BM today  Objective:  Vital Signs in the last 24 hours: Temp:  [98.1 F (36.7 C)-99 F (37.2 C)] 98.4 F (36.9 C) (04/09 0910) Pulse Rate:  [59-82] 59 (04/09 0910) Resp:  [9-57] 20 (04/09 0910) BP: (126-182)/(56-98) 126/58 mmHg (04/09 0910) SpO2:  [91 %-100 %] 95 % (04/09 0910) Weight:  [81.4 kg (179 lb 7.3 oz)] 81.4 kg (179 lb 7.3 oz) (04/08 2032)  Intake/Output from previous day: 04/08 0701 - 04/09 0700 In: -  Out: 1925 [Urine:1925] Intake/Output from this shift: Total I/O In: 584.2 [I.V.:584.2] Out: 400 [Urine:400]  Physical Exam: Neck: no adenopathy, no carotid bruit, no JVD and supple, symmetrical, trachea midline Lungs: clear to auscultation bilaterally Heart: regular rate and rhythm, S1, S2 normal and Systolic murmur noted no S3 gallop Abdomen: soft, non-tender; bowel sounds normal; no masses,  no organomegaly Extremities: extremities normal, atraumatic, no cyanosis or edema  Lab Results:  Recent Labs  04/29/12 1903 04/30/12 0530  WBC 8.3 6.0  HGB 10.8* 9.8*  PLT 163 156    Recent Labs  04/29/12 1903 04/30/12 0530  NA 141 145  K 3.5 3.7  CL 106 111  CO2 26 28  GLUCOSE 78 80  BUN 19 18  CREATININE 1.33 1.34   No results found for this basename: TROPONINI, CK, MB,  in the last 72 hours Hepatic Function Panel  Recent Labs  04/29/12 1244  PROT 6.1  ALBUMIN 2.6*  AST 18  ALT 15  ALKPHOS 69  BILITOT 0.3   No results found for this basename: CHOL,  in the last 72 hours No results found for this basename: PROTIME,  in the last 72 hours  Imaging: Imaging results have been reviewed and No results found.  Cardiac Studies:  Assessment/Plan:  Status post GI bleeding workup in progress Coumadin toxicity Acute anemia secondary to 1  History of PE in the past  History of DVT in the past  History of paroxysmal A. fib in the past  Hypertension   Diabetes mellitus  CAD status post multivessel PCI in the past  Ischemic cardiomyopathy  History of systolic heart failure in the past  Hypercholesteremia  Remote tobacco abuse  Chronic kidney status E. stage III  Hypothyroidism  History pancreatitis in the past  GERD  Status post bilateral inguinal hernia repair last month  Plan Continue present management Vitamin K as per orders Check labs in a.m. Scheduled for colonoscopy in a.m.  LOS: 1 day    [name] N 04/30/2012, 12:40 PM

## 2012-04-30 NOTE — Interval H&P Note (Signed)
History and Physical Interval Note:  04/30/2012 7:42 AM  [name]  has presented today for surgery, with the diagnosis of GI bleed  The various methods of treatment have been discussed with the patient and family. After consideration of risks, benefits and other options for treatment, the patient has consented to  Procedure(s): ESOPHAGOGASTRODUODENOSCOPY (EGD) (N/A) as a surgical intervention .  The patient's history has been reviewed, patient examined, no change in status, stable for surgery.  I have reviewed the patient's chart and labs.  Questions were answered to the patient's satisfaction.     Malayshia All D

## 2012-04-30 NOTE — Op Note (Signed)
Eligha Brideg[facility] [address] [place], [address]   OPERATIVE PROCEDURE REPORT  PATIENT: [name], [name]  MR#: [id number] BIRTHDATE: 1929-10-03  GENDER: Male ENDOSCOPIST: Jeani Hawking, MD ASSISTANT:   Jimmey Ralph, RN, CGRN and Oletha Blend, technician  PROCEDURE DATE: 04/30/2012 PROCEDURE:   EGD, diagnostic ASA CLASS:   Class III INDICATIONS:Melena. MEDICATIONS: Versed 5 mg IV and Fentanyl 50 mcg IV TOPICAL ANESTHETIC:   none  DESCRIPTION OF PROCEDURE:   After the risks benefits and alternatives of the procedure were thoroughly explained, informed consent was obtained.  The Pentax Gastroscope [id number]  endoscope was introduced through the mouth  and advanced to the second portion of the duodenum Without limitations.      The instrument was slowly withdrawn as the mucosa was fully examined.      FINDINGS: A nonobstructive Schatzki ring was found in the settin gof a small hiatal hernia.  No evidence of any masses, inflammation, ulcerations, erosions, or vascular abnormalities in the upper GI tract.   Retroflexed views revealed no abnormalities.     The scope was then withdrawn from the patient and the procedure terminated.  COMPLICATIONS: There were no complications. IMPRESSION: 1) Schatzki ring was found  RECOMMENDATIONS: 1) Colonoscopy tomorrow.   _______________________________ eSignedJeani Hawking, MD 04/30/2012 8:17 AM

## 2012-05-01 ENCOUNTER — Encounter (HOSPITAL_COMMUNITY)
Admission: EM | Disposition: A | Discharge status: Home / Self Care | Payer: Self-pay | Source: Home / Self Care | Attending: Cardiology

## 2012-05-01 ENCOUNTER — Encounter (HOSPITAL_COMMUNITY): Payer: Self-pay | Admitting: Gastroenterology

## 2012-05-01 HISTORY — PX: COLONOSCOPY: SHX5424

## 2012-05-01 LAB — CBC
HCT: 27.1 % — ABNORMAL LOW (ref 39.0–52.0)
Hemoglobin: 9.6 g/dL — ABNORMAL LOW (ref 13.0–17.0)
RBC: 3.37 MIL/uL — ABNORMAL LOW (ref 4.22–5.81)
WBC: 7.2 10*3/uL (ref 4.0–10.5)

## 2012-05-01 LAB — PROTIME-INR
INR: 3.16 — ABNORMAL HIGH (ref 0.00–1.49)
Prothrombin Time: 30.7 seconds — ABNORMAL HIGH (ref 11.6–15.2)

## 2012-05-01 LAB — GLUCOSE, CAPILLARY
Glucose-Capillary: 100 mg/dL — ABNORMAL HIGH (ref 70–99)
Glucose-Capillary: 99 mg/dL (ref 70–99)

## 2012-05-01 LAB — BASIC METABOLIC PANEL
Chloride: 111 mEq/L (ref 96–112)
GFR calc Af Amer: 57 mL/min — ABNORMAL LOW (ref >90)
GFR calc non Af Amer: 49 mL/min — ABNORMAL LOW (ref >90)
Potassium: 3.5 mEq/L (ref 3.5–5.1)
Sodium: 143 mEq/L (ref 135–145)

## 2012-05-01 LAB — HEMOGLOBIN: Hemoglobin: 8.7 g/dL — ABNORMAL LOW (ref 13.0–17.0)

## 2012-05-01 LAB — HEMATOCRIT: HCT: 24.5 % — ABNORMAL LOW (ref 39.0–52.0)

## 2012-05-01 SURGERY — COLONOSCOPY
Anesthesia: Moderate Sedation

## 2012-05-01 MED ORDER — MIDAZOLAM HCL 5 MG/ML IJ SOLN
INTRAMUSCULAR | Status: AC
  Filled 2012-05-01: qty 2

## 2012-05-01 MED ORDER — MIDAZOLAM HCL 10 MG/2ML IJ SOLN
INTRAMUSCULAR | Status: DC | PRN
  Administered 2012-05-01 (×4): 2 mg via INTRAVENOUS

## 2012-05-01 MED ORDER — FENTANYL CITRATE 0.05 MG/ML IJ SOLN
INTRAMUSCULAR | Status: AC
  Filled 2012-05-01: qty 2

## 2012-05-01 MED ORDER — ACETAMINOPHEN 325 MG PO TABS
650.0000 mg | ORAL_TABLET | Freq: Three times a day (TID) | ORAL | Status: DC | PRN
  Administered 2012-05-01 – 2012-05-02 (×2): 650 mg via ORAL
  Filled 2012-05-01 (×2): qty 2

## 2012-05-01 MED ORDER — FENTANYL CITRATE 0.05 MG/ML IJ SOLN
INTRAMUSCULAR | Status: DC | PRN
  Administered 2012-05-01 (×3): 25 ug via INTRAVENOUS

## 2012-05-01 NOTE — Op Note (Signed)
[name] [address] [place], [address]   COLONOSCOPY PROCEDURE REPORT  PATIENT: [name], [name]  MR#: [id number] BIRTHDATE: 08-Apr-1929 , 82  yrs. old GENDER: Male ENDOSCOPIST: Jeani Hawking, MD REFERRED AO:ZHYQM Sharyn Lull, M.D. PROCEDURE DATE:  05/01/2012 PROCEDURE:   Colonoscopy with control of bleeding ASA CLASS:   Class III INDICATIONS:Melena and anemia. MEDICATIONS: Versed 8 mg IV and Fentanyl 75 mcg IV  DESCRIPTION OF PROCEDURE:   After the risks benefits and alternatives of the procedure were thoroughly explained, informed consent was obtained.  A digital rectal exam revealed no abnormalities of the rectum.   The     endoscope was introduced through the anus and advanced to the terminal ileum which was intubated for a short distance. No adverse events experienced. The quality of the prep was adequate.  The instrument was then slowly withdrawn as the colon was fully examined.      FINDINGS: In the proximal colon the prep was poor, but it was able to be washed.  Melena was identified and it was clear that the source of bleeding was from the proximal colon and/or the distal TI.  Extensive washing was performed and multple small oozing, distal TI, cecal and proximal ascending colon AVMs were identified. All of the identified sites were treated wtih APC with no further bleeding.  There may be other sites in this region that were not visualized.  Scattered diverticula were noted throughout the entire colon.  Retroflexed views revealed internal/external hemorrhoids. The time to cecum=  .  Withdrawal time=  .  The scope was withdrawn and the procedure completed. COMPLICATIONS: There were no complications.  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  RECOMMENDATIONS: 1) Follow HGB. 2) Transfuse if necessary. 3) Avoid coumadin, if possible.   eSigned:  Jeani Hawking, MD 05/01/2012 2:40  PM   cc:   PATIENT NAME:  [name], Blake MR#: [id number]

## 2012-05-01 NOTE — H&P (View-Only) (Signed)
Subjective:  Any chest pain or shortness of breath states feels little better. Scheduled for colonoscopy today hemoglobin remained stable  Objective:  Vital Signs in the last 24 hours: Temp:  [97.5 F (36.4 C)-98 F (36.7 C)] 97.5 F (36.4 C) (04/10 8119) Pulse Rate:  [68-85] 85 (04/10 0652) Resp:  [18-20] 20 (04/10 0652) BP: (124-175)/(55-76) 175/76 mmHg (04/10 0652) SpO2:  [93 %-100 %] 96 % (04/10 0652)  Intake/Output from previous day: 04/09 0701 - 04/10 0700 In: 1188.8 [P.O.:210; I.V.:978.8] Out: 927 [Urine:925; Stool:2] Intake/Output from this shift: Total I/O In: -  Out: 200 [Urine:200]  Physical Exam: Neck: no adenopathy, no carotid bruit, no JVD and supple, symmetrical, trachea midline Lungs: clear to auscultation bilaterally Heart: regular rate and rhythm, S1, S2 normal and Soft systolic murmur noted Abdomen: soft, non-tender; bowel sounds normal; no masses,  no organomegaly Extremities: extremities normal, atraumatic, no cyanosis or edema  Lab Results:  Recent Labs  04/30/12 0530 04/30/12 1807 05/01/12 0646  WBC 6.0  --  7.2  HGB 9.8* 9.4* 9.6*  PLT 156  --  150    Recent Labs  04/30/12 0530 05/01/12 0646  NA 145 143  K 3.7 3.5  CL 111 111  CO2 28 27  GLUCOSE 80 94  BUN 18 14  CREATININE 1.34 1.33   No results found for this basename: TROPONINI, CK, MB,  in the last 72 hours Hepatic Function Panel  Recent Labs  04/29/12 1244  PROT 6.1  ALBUMIN 2.6*  AST 18  ALT 15  ALKPHOS 69  BILITOT 0.3   No results found for this basename: CHOL,  in the last 72 hours No results found for this basename: PROTIME,  in the last 72 hours  Imaging: Imaging results have been reviewed and No results found.  Cardiac Studies:  Assessment/Plan:  Status post GI bleeding workup in progress  Resolving Coumadin toxicity  Acute anemia secondary to 1  History of PE in the past  History of DVT in the past  History of paroxysmal A. fib in the past   Hypertension  Diabetes mellitus  CAD status post multivessel PCI in the past  Ischemic cardiomyopathy  History of systolic heart failure in the past  Hypercholesteremia  Remote tobacco abuse  Chronic kidney disease stage III  Hypothyroidism  History pancreatitis in the past  GERD  Status post bilateral inguinal hernia repair last month  Plan Continue present management Check labs in a.m. Will DC home when okay from GI point of view.  LOS: 2 days    Tasneem Cormier N 05/01/2012, 9:28 AM

## 2012-05-01 NOTE — Clinical Documentation Improvement (Signed)
Anemia Documentation Clarification Query  THIS DOCUMENT IS NOT A PERMANENT PART OF THE MEDICAL RECORD  RESPOND TO THE THIS QUERY, FOLLOW THE INSTRUCTIONS BELOW:  1. If needed, update documentation for the patient's encounter via the notes activity.  2. Access this query again and click edit on the In [employer].  3. After updating, or not, click F2 to complete all highlighted (required) fields concerning your review. Select "additional documentation in the medical record" OR "no additional documentation provided".  4. Click Sign note button.  5. The deficiency will fall out of your In Basket *Please let us know if you are not able to complete this workflow by phone or e-mail (listed below).          05/01/12  Dear Dr. Tyler Aas Marton Redwood  In an effort to better capture your patient's severity of illness, reflect appropriate length of stay and utilization of resources, a review of the patient medical record has revealed the following indicators.    Based on your clinical judgment, please clarify and document in a progress note and/or discharge summary the clinical condition associated with the following supporting information:  In responding to this query please exercise your independent judgment.  The fact that a query is asked, does not imply that any particular answer is desired or expected.  Noting documentation of "acute anemia" if possible please state type of anemia being monitored if known.  Thank you   Possible Clinical Conditions?  " Iron deficiency anemia " Normocytic anemia " Chronic blood loss anemia " Anemia due to chronic disease " Acute Blood Loss Anemia " Precipitous drop in Hematocrit "  " Other Condition  " Cannot Clinically Determine    IVF : NS @ 125 ml/hr 4/7-04/29/2012, now at 20 ml/hr   Serial H&H monitoring: H/H  q 12 hours   You may use possible, probable, or suspect with inpatient documentation. Possible, probable, suspected diagnoses MUST be  documented at the time of discharge.  Reviewed: Acute blood loss anemia secondary to 1- stable  prog nt 05/04/12 signed by dr. [name]   Thank You,  [name]  Clinical Documentation Specialist, BSN: Pager [phone]  Health Information Management [place]

## 2012-05-01 NOTE — Plan of Care (Signed)
Problem: Phase I Progression Outcomes Goal: Initial discharge plan identified Outcome: Completed/Met Date Met:  05[facility]01/[facility]

## 2012-05-01 NOTE — Progress Notes (Signed)
Subjective:  Any chest pain or shortness of breath states feels little better. Scheduled for colonoscopy today hemoglobin remained stable  Objective:  Vital Signs in the last 24 hours: Temp:  [97.5 F (36.4 C)-98 F (36.7 C)] 97.5 F (36.4 C) (04/10 0652) Pulse Rate:  [68-85] 85 (04/10 0652) Resp:  [18-20] 20 (04/10 0652) BP: (124-175)/(55-76) 175/76 mmHg (04/10 0652) SpO2:  [93 %-100 %] 96 % (04/10 0652)  Intake/Output from previous day: 04/09 0701 - 04/10 0700 In: 1188.8 [P.O.:210; I.V.:978.8] Out: 927 [Urine:925; Stool:2] Intake/Output from this shift: Total I/O In: -  Out: 200 [Urine:200]  Physical Exam: Neck: no adenopathy, no carotid bruit, no JVD and supple, symmetrical, trachea midline Lungs: clear to auscultation bilaterally Heart: regular rate and rhythm, S1, S2 normal and Soft systolic murmur noted Abdomen: soft, non-tender; bowel sounds normal; no masses,  no organomegaly Extremities: extremities normal, atraumatic, no cyanosis or edema  Lab Results:  Recent Labs  04/30/12 0530 04/30/12 1807 05/01/12 0646  WBC 6.0  --  7.2  HGB 9.8* 9.4* 9.6*  PLT 156  --  150    Recent Labs  04/30/12 0530 05/01/12 0646  NA 145 143  K 3.7 3.5  CL 111 111  CO2 28 27  GLUCOSE 80 94  BUN 18 14  CREATININE 1.34 1.33   No results found for this basename: TROPONINI, CK, MB,  in the last 72 hours Hepatic Function Panel  Recent Labs  04/29/12 1244  PROT 6.1  ALBUMIN 2.6*  AST 18  ALT 15  ALKPHOS 69  BILITOT 0.3   No results found for this basename: CHOL,  in the last 72 hours No results found for this basename: PROTIME,  in the last 72 hours  Imaging: Imaging results have been reviewed and No results found.  Cardiac Studies:  Assessment/Plan:  Status post GI bleeding workup in progress  Resolving Coumadin toxicity  Acute anemia secondary to 1  History of PE in the past  History of DVT in the past  History of paroxysmal A. fib in the past   Hypertension  Diabetes mellitus  CAD status post multivessel PCI in the past  Ischemic cardiomyopathy  History of systolic heart failure in the past  Hypercholesteremia  Remote tobacco abuse  Chronic kidney disease stage III  Hypothyroidism  History pancreatitis in the past  GERD  Status post bilateral inguinal hernia repair last month  Plan Continue present management Check labs in a.m. Will DC home when okay from GI point of view.  LOS: 2 days    [name] N 05/01/2012, 9:28 AM    

## 2012-05-01 NOTE — Interval H&P Note (Signed)
History and Physical Interval Note:  05/01/2012 1:36 PM  [name]  has presented today for surgery, with the diagnosis of Heme positive stool, melena, and anemia  The various methods of treatment have been discussed with the patient and family. After consideration of risks, benefits and other options for treatment, the patient has consented to  Procedure(s): COLONOSCOPY (N/A) as a surgical intervention .  The patient's history has been reviewed, patient examined, no change in status, stable for surgery.  I have reviewed the patient's chart and labs.  Questions were answered to the patient's satisfaction.     [name] D

## 2012-05-02 LAB — GLUCOSE, CAPILLARY
Glucose-Capillary: 122 mg/dL — ABNORMAL HIGH (ref 70–99)
Glucose-Capillary: 114 mg/dL — ABNORMAL HIGH (ref 70–99)

## 2012-05-02 LAB — BASIC METABOLIC PANEL
BUN: 17 mg/dL (ref 6–23)
CO2: 28 mEq/L (ref 19–32)
Chloride: 109 mEq/L (ref 96–112)
GFR calc Af Amer: 51 mL/min — ABNORMAL LOW (ref >90)
Glucose, Bld: 116 mg/dL — ABNORMAL HIGH (ref 70–99)
Potassium: 3.9 mEq/L (ref 3.5–5.1)

## 2012-05-02 LAB — CBC
HCT: 27.1 % — ABNORMAL LOW (ref 39.0–52.0)
Hemoglobin: 9.4 g/dL — ABNORMAL LOW (ref 13.0–17.0)
MCV: 81.4 fL (ref 78.0–100.0)
RBC: 3.33 MIL/uL — ABNORMAL LOW (ref 4.22–5.81)
RDW: 16.6 % — ABNORMAL HIGH (ref 11.5–15.5)
WBC: 8.5 10*3/uL (ref 4.0–10.5)

## 2012-05-02 LAB — HEMATOCRIT: HCT: 29.8 % — ABNORMAL LOW (ref 39.0–52.0)

## 2012-05-02 MED ORDER — POLYVINYL ALCOHOL 1.4 % OP SOLN
2.0000 [drp] | Freq: Three times a day (TID) | OPHTHALMIC | Status: DC
  Administered 2012-05-02 – 2012-05-05 (×10): 2 [drp] via OPHTHALMIC
  Filled 2012-05-02: qty 15

## 2012-05-02 MED ORDER — OXYCODONE HCL 5 MG PO TABS
5.0000 mg | ORAL_TABLET | ORAL | Status: DC | PRN
  Administered 2012-05-02 – 2012-05-05 (×5): 10 mg via ORAL
  Filled 2012-05-02 (×5): qty 2

## 2012-05-02 NOTE — Progress Notes (Signed)
[facility]  Patient Status: New  [facility] is providing the following services: PT, OT and HHA  If patient discharges after hours, please call [phone].   [name] 05/02/2012, 3:05 PM

## 2012-05-02 NOTE — Progress Notes (Signed)
Subjective:  Patient complains of vague generalized abdominal pain. Denies any nausea or vomiting or diarrhea. Hemoglobin now remains stable. States feels weak and did not feel like going home today  Objective:  Vital Signs in the last 24 hours: Temp:  [97.6 F (36.4 C)-98.7 F (37.1 C)] 98.7 F (37.1 C) (04/11 0500) Pulse Rate:  [57-70] 64 (04/11 0957) Resp:  [15-29] 18 (04/11 0500) BP: (99-194)/(59-89) 130/60 mmHg (04/11 0957) SpO2:  [70 %-100 %] 96 % (04/11 0500)  Intake/Output from previous day: 04/10 0701 - 04/11 0700 In: -  Out: 400 [Urine:400] Intake/Output from this shift: Total I/O In: -  Out: 300 [Urine:300]  Physical Exam: Neck: no adenopathy, no carotid bruit, no JVD and supple, symmetrical, trachea midline Lungs: clear to auscultation bilaterally Heart: regular rate and rhythm, S1, S2 normal, no murmur, click, rub or gallop Abdomen: Soft bowel sounds present mild generalized tenderness no guarding or rebound Extremities: extremities normal, atraumatic, no cyanosis or edema  Lab Results:  Recent Labs  05/01/12 0646 05/01/12 1747 05/02/12 0646  WBC 7.2  --  8.5  HGB 9.6* 8.7* 9.4*  PLT 150  --  162    Recent Labs  05/01/12 0646 05/02/12 0646  NA 143 143  K 3.5 3.9  CL 111 109  CO2 27 28  GLUCOSE 94 116*  BUN 14 17  CREATININE 1.33 1.46*   No results found for this basename: TROPONINI, CK, MB,  in the last 72 hours Hepatic Function Panel  Recent Labs  04/29/12 1244  PROT 6.1  ALBUMIN 2.6*  AST 18  ALT 15  ALKPHOS 69  BILITOT 0.3   No results found for this basename: CHOL,  in the last 72 hours No results found for this basename: PROTIME,  in the last 72 hours  Imaging: Imaging results have been reviewed and No results found.  Cardiac Studies:  Assessment/Plan:  Status post lower GI bleeding status post colonoscopy and cauterization of AVM Coumadin toxicity  Acute anemia secondary to 1  stable  History of PE in the past  History  of DVT in the past  History of paroxysmal A. fib in the past  Hypertension  Diabetes mellitus  CAD status post multivessel PCI in the past  Ischemic cardiomyopathy  History of systolic heart failure in the past  Hypercholesteremia  Remote tobacco abuse  Chronic kidney disease stage III  Hypothyroidism  History pancreatitis in the past  GERD  Status post bilateral inguinal hernia repair last month   PLAN INCREASE AMBULATION ADVANCE DIET AS PER GI POSSIBLE DISCHARGE IN A.M.  DR. [name] ON CALL FOR WEEKEND   LOS: 3 days    [name] N 05/02/2012, 12:43 PM

## 2012-05-02 NOTE — Evaluation (Signed)
Physical Therapy Evaluation Patient Details Name: [name] MRN: [id number] DOB: 10/27/1929 Today's Date: 05/02/2012 Time: 4098-1191 PT Time Calculation (min): 29 min  PT Assessment / Plan / Recommendation Clinical Impression  79 yo male admitted with GI bleed.  Pt takes excessive time for mobility, but was able to walk with RW in hallway and to bathroom.  He wants to go home and will benefit from HHPT, HHOT and aide as he recovers functional independence.    PT Assessment  Patient needs continued PT services    Follow Up Recommendations  Home health PT    Does the patient have the potential to tolerate intense rehabilitation      Barriers to Discharge        Equipment Recommendations  Rolling walker with 5" wheels    Recommendations for Other Services     Frequency Min 3X/week    Precautions / Restrictions     Pertinent Vitals/Pain Pt states he sometimes has stomach pain after he eats      Mobility  Bed Mobility Bed Mobility: Supine to Sit;Sit to Supine Supine to Sit: 5: Supervision Sit to Supine: 5: Supervision Transfers Transfers: Sit to Stand;Stand to Sit Sit to Stand: 5: Supervision Stand to Sit: 5: Supervision Ambulation/Gait Ambulation/Gait Assistance: 5: Supervision Ambulation Distance (Feet): 200 Feet Assistive device: Rolling walker Ambulation/Gait Assistance Details: pt with some assist with RW management. Occasional cues to stand erect Gait Pattern: Step-through pattern Gait velocity: decreased General Gait Details: Pt comfortable with using RW, though he has not used it before.   Stairs: No Wheelchair Mobility Wheelchair Mobility: No    Exercises     PT Diagnosis: Generalized weakness  PT Problem List: Decreased activity tolerance;Decreased mobility;Decreased knowledge of use of DME PT Treatment Interventions:     PT Goals Acute Rehab PT Goals PT Goal Formulation: With patient Time For Goal Achievement: 05/16/12 Potential to Achieve  Goals: Good Pt will go Supine/Side to Sit: Independently PT Goal: Supine/Side to Sit - Progress: Goal set today Pt will go Sit to Supine/Side: Independently PT Goal: Sit to Supine/Side - Progress: Goal set today Pt will go Sit to Stand: Independently PT Goal: Sit to Stand - Progress: Goal set today Pt will go Stand to Sit: with modified independence PT Goal: Stand to Sit - Progress: Goal set today Pt will Ambulate: >150 feet;with modified independence;with least restrictive assistive device PT Goal: Ambulate - Progress: Goal set today  Visit Information  Last PT Received On: 05/02/12 Assistance Needed: +1    Subjective Data  Subjective: "i live in independent living" Patient Stated Goal: to go home   Prior Functioning  Home Living Lives With: Alone Type of Home: Apartment Home Access: Elevator Home Layout: One level Home Adaptive Equipment: Quad cane Prior Function Level of Independence: Independent with assistive device(s) Driving: Yes Communication Communication: No difficulties    Cognition  Cognition Overall Cognitive Status: Appears within functional limits for tasks assessed/performed Arousal/Alertness: Awake/alert Orientation Level: Appears intact for tasks assessed Behavior During Session: [facility] for tasks performed    Extremity/Trunk Assessment Right Lower Extremity Assessment RLE ROM/Strength/Tone: [facility] for tasks assessed Left Lower Extremity Assessment LLE ROM/Strength/Tone: [facility] for tasks assessed Trunk Assessment Trunk Assessment: Normal   Balance Balance Balance Assessed: Yes Static Sitting Balance Static Sitting - Balance Support: No upper extremity supported;Feet supported Static Sitting - Level of Assistance: 7: Independent Static Standing Balance Static Standing - Balance Support: Bilateral upper extremity supported;During functional activity Static Standing - Level of Assistance: 6: Modified  independent (Device/Increase time)  End of Session PT -  End of Session Equipment Utilized During Treatment: Gait belt Activity Tolerance: Patient limited by fatigue Patient left: in chair;with call bell/phone within reach Nurse Communication: Mobility status  GP    Rosey Bath K. Manson Passey, [place] [phone] 05/02/2012, 3:56 PM

## 2012-05-03 LAB — GLUCOSE, CAPILLARY
Glucose-Capillary: 106 mg/dL — ABNORMAL HIGH (ref 70–99)
Glucose-Capillary: 114 mg/dL — ABNORMAL HIGH (ref 70–99)
Glucose-Capillary: 130 mg/dL — ABNORMAL HIGH (ref 70–99)

## 2012-05-03 LAB — HEMOGLOBIN: Hemoglobin: 9.4 g/dL — ABNORMAL LOW (ref 13.0–17.0)

## 2012-05-03 NOTE — Progress Notes (Signed)
Subjective:  Feeling better. No chest pain. Small drop in Hgb.   Objective:  Vital Signs in the last 24 hours: Temp:  [98 F (36.7 C)-98.4 F (36.9 C)] 98 F (36.7 C) (04/12 0520) Pulse Rate:  [62-65] 64 (04/12 0520) Cardiac Rhythm:  [-] Normal sinus rhythm (04/12 0801) Resp:  [18-20] 20 (04/12 0520) BP: (153-159)/(71) 157/71 mmHg (04/12 0520) SpO2:  [95 %-98 %] 95 % (04/12 0520)  Physical Exam: BP Readings from Last 1 Encounters:  05/03/12 157/71     Wt Readings from Last 1 Encounters:  04/29/12 81.4 kg (179 lb 7.3 oz)    Weight change:   HEENT: [place]/AT, Eyes-Brown, PERL, EOMI, Conjunctiva-Pale pink, Sclera-Non-icteric Neck: No JVD, No bruit, Trachea midline. Lungs:  Clear, Bilateral. Cardiac:  Regular rhythm, normal S1 and S2, no S3.  Abdomen:  Soft, non-tender. Extremities:  No edema present. No cyanosis. No clubbing. CNS: AxOx3, Cranial nerves grossly intact, moves all 4 extremities. Right handed. Skin: Warm and dry.   Intake/Output from previous day: 04/11 0701 - 04/12 0700 In: 360 [P.O.:360] Out: 1050 [Urine:1050]    Lab Results: BMET    Component Value Date/Time   NA 143 05/02/2012 0646   K 3.9 05/02/2012 0646   CL 109 05/02/2012 0646   CO2 28 05/02/2012 0646   GLUCOSE 116* 05/02/2012 0646   BUN 17 05/02/2012 0646   CREATININE 1.46* 05/02/2012 0646   CALCIUM 8.6 05/02/2012 0646   GFRNONAA 44* 05/02/2012 0646   GFRAA 51* 05/02/2012 0646   CBC    Component Value Date/Time   WBC 8.5 05/02/2012 0646   RBC 3.33* 05/02/2012 0646   HGB 9.4* 05/03/2012 0646   HCT 26.5* 05/03/2012 0646   PLT 162 05/02/2012 0646   MCV 81.4 05/02/2012 0646   MCH 28.2 05/02/2012 0646   MCHC 34.7 05/02/2012 0646   RDW 16.6* 05/02/2012 0646   LYMPHSABS 1.0 04/29/2012 1244   MONOABS 0.6 04/29/2012 1244   EOSABS 0.0 04/29/2012 1244   BASOSABS 0.0 04/29/2012 1244   CARDIAC ENZYMES Lab Results  Component Value Date   CKTOTAL 48 07/19/2011   CKMB 1.4 07/19/2011   TROPONINI 0.32* 07/19/2011     Scheduled Meds: . amiodarone  200 mg Oral Daily  . atorvastatin  20 mg Oral q1800  . insulin aspart  0-9 Units Subcutaneous TID WC  . isosorbide mononitrate  60 mg Oral q morning - 10a  . levothyroxine  50 mcg Oral Q1200  . polyvinyl alcohol  2 drop Right Eye TID  . tamsulosin  0.4 mg Oral QHS   Continuous Infusions: . sodium chloride 10 mL/hr (05/02/12 0743)  . dextrose 5 % and 0.9% NaCl 50 mL (04/30/12 1011)   PRN Meds:.acetaminophen, albuterol, alum & mag hydroxide-simeth, nitroGLYCERIN, ondansetron, oxyCODONE  Assessment/Plan: Status post lower GI bleeding status post colonoscopy and cauterization of AVM  Coumadin toxicity  Acute anemia secondary to 1 stable  History of PE in the past  History of DVT in the past  History of paroxysmal A. fib in the past  Hypertension  Diabetes mellitus  CAD status post multivessel PCI in the past  Ischemic cardiomyopathy  History of systolic heart failure in the past  Hypercholesteremia  Remote tobacco abuse  Chronic kidney disease stage III  Hypothyroidism  History pancreatitis in the past  GERD  Status post bilateral inguinal hernia repair last month   Increase activity. Blood work in AM.   LOS: 4 days    Orpah Cobb  MD  05/03/2012, 10:31 AM

## 2012-05-04 LAB — BASIC METABOLIC PANEL
BUN: 14 mg/dL (ref 6–23)
Calcium: 8.6 mg/dL (ref 8.4–10.5)
GFR calc non Af Amer: 42 mL/min — ABNORMAL LOW (ref >90)
Glucose, Bld: 125 mg/dL — ABNORMAL HIGH (ref 70–99)
Sodium: 144 mEq/L (ref 135–145)

## 2012-05-04 LAB — HEMOGLOBIN: Hemoglobin: 8.9 g/dL — ABNORMAL LOW (ref 13.0–17.0)

## 2012-05-04 LAB — GLUCOSE, CAPILLARY: Glucose-Capillary: 152 mg/dL — ABNORMAL HIGH (ref 70–99)

## 2012-05-04 LAB — HEMATOCRIT: HCT: 25.2 % — ABNORMAL LOW (ref 39.0–52.0)

## 2012-05-04 LAB — PROTIME-INR: Prothrombin Time: 18.2 seconds — ABNORMAL HIGH (ref 11.6–15.2)

## 2012-05-04 MED ORDER — BISACODYL 10 MG RE SUPP: 10.0000 mg | Freq: Every day | RECTAL | Status: DC | PRN

## 2012-05-04 NOTE — Progress Notes (Signed)
Subjective:  Feeling same. Not ambulating much.  Objective:  Vital Signs in the last 24 hours: Temp:  [98.1 F (36.7 C)-98.6 F (37 C)] 98.1 F (36.7 C) (04/13 0459) Pulse Rate:  [61-78] 61 (04/13 0459) Cardiac Rhythm:  [-] Normal sinus rhythm (04/13 0753) Resp:  [18-20] 18 (04/13 0459) BP: (155-170)/(69-71) 155/69 mmHg (04/13 0459) SpO2:  [97 %-99 %] 99 % (04/13 0459)  Physical Exam: BP Readings from Last 1 Encounters:  05/04/12 155/69     Wt Readings from Last 1 Encounters:  04/29/12 81.4 kg (179 lb 7.3 oz)    Weight change:   HEENT: [place]/AT, Eyes-Brown, PERL, EOMI, Conjunctiva-Pale, Sclera-Non-icteric Neck: No JVD, No bruit, Trachea midline. Lungs:  Clear, Bilateral. Cardiac:  Regular rhythm, normal S1 and S2, no S3.  Abdomen:  Soft, non-tender. Extremities:  No edema present. No cyanosis. No clubbing. CNS: AxOx3, Cranial nerves grossly intact, moves all 4 extremities. Right handed. Skin: Warm and dry.   Intake/Output from previous day: 04/12 0701 - 04/13 0700 In: 360 [P.O.:360] Out: 500 [Urine:500]    Lab Results: BMET    Component Value Date/Time   NA 144 05/04/2012 0450   K 4.0 05/04/2012 0450   CL 110 05/04/2012 0450   CO2 27 05/04/2012 0450   GLUCOSE 125* 05/04/2012 0450   BUN 14 05/04/2012 0450   CREATININE 1.51* 05/04/2012 0450   CALCIUM 8.6 05/04/2012 0450   GFRNONAA 42* 05/04/2012 0450   GFRAA 49* 05/04/2012 0450   CBC    Component Value Date/Time   WBC 8.5 05/02/2012 0646   RBC 3.33* 05/02/2012 0646   HGB 8.9* 05/04/2012 0450   HCT 25.2* 05/04/2012 0450   PLT 162 05/02/2012 0646   MCV 81.4 05/02/2012 0646   MCH 28.2 05/02/2012 0646   MCHC 34.7 05/02/2012 0646   RDW 16.6* 05/02/2012 0646   LYMPHSABS 1.0 04/29/2012 1244   MONOABS 0.6 04/29/2012 1244   EOSABS 0.0 04/29/2012 1244   BASOSABS 0.0 04/29/2012 1244   CARDIAC ENZYMES Lab Results  Component Value Date   CKTOTAL 48 07/19/2011   CKMB 1.4 07/19/2011   TROPONINI 0.32* 07/19/2011    Scheduled Meds: .  amiodarone  200 mg Oral Daily  . atorvastatin  20 mg Oral q1800  . insulin aspart  0-9 Units Subcutaneous TID WC  . isosorbide mononitrate  60 mg Oral q morning - 10a  . levothyroxine  50 mcg Oral Q1200  . polyvinyl alcohol  2 drop Right Eye TID  . tamsulosin  0.4 mg Oral QHS   Continuous Infusions: . sodium chloride 10 mL/hr (05/02/12 0743)  . dextrose 5 % and 0.9% NaCl 50 mL (04/30/12 1011)   PRN Meds:.acetaminophen, albuterol, alum & mag hydroxide-simeth, nitroGLYCERIN, ondansetron, oxyCODONE  Assessment/Plan:  Acute lower GI bleeding  status post colonoscopy and cauterization of AVMs  Coumadin toxicity  Acute blood loss anemia secondary to 1- stable  History of PE in the past  History of DVT in the past  History of paroxysmal A. fib in the past  Hypertension  Diabetes mellitus  CAD status post multivessel PCI in the past  Ischemic cardiomyopathy  History of systolic heart failure in the past  Hypercholesteremia  Remote tobacco abuse  Chronic kidney disease stage III  Hypothyroidism  History pancreatitis in the past  GERD  Status post bilateral inguinal hernia repair last month   Increase activity.   LOS: 5 days    Orpah Cobb  MD  05/04/2012, 10:44 AM

## 2012-05-05 ENCOUNTER — Encounter (HOSPITAL_COMMUNITY): Payer: Self-pay | Admitting: Gastroenterology

## 2012-05-05 LAB — CBC
Hemoglobin: 9.1 g/dL — ABNORMAL LOW (ref 13.0–17.0)
MCH: 28.3 pg (ref 26.0–34.0)
MCHC: 34.7 g/dL (ref 30.0–36.0)
MCV: 81.4 fL (ref 78.0–100.0)
RBC: 3.22 MIL/uL — ABNORMAL LOW (ref 4.22–5.81)

## 2012-05-05 LAB — BASIC METABOLIC PANEL
BUN: 12 mg/dL (ref 6–23)
CO2: 29 mEq/L (ref 19–32)
Calcium: 8.7 mg/dL (ref 8.4–10.5)
Creatinine, Ser: 1.36 mg/dL — ABNORMAL HIGH (ref 0.50–1.35)
GFR calc non Af Amer: 48 mL/min — ABNORMAL LOW (ref >90)
Glucose, Bld: 96 mg/dL (ref 70–99)

## 2012-05-05 LAB — GLUCOSE, CAPILLARY: Glucose-Capillary: 123 mg/dL — ABNORMAL HIGH (ref 70–99)

## 2012-05-05 NOTE — Progress Notes (Signed)
[name] discharged Home with H/H PT per MD order.  Discharge instructions reviewed and discussed with the patient, all questions and concerns answered. Copy of instructions and scripts given to patient.  Pt. Wallet and home meds return and verified.    Medication List    STOP taking these medications       traMADol 50 MG tablet  Commonly known as:  ULTRAM     warfarin 4 MG tablet  Commonly known as:  COUMADIN      TAKE these medications       acetaminophen 500 MG tablet  Commonly known as:  TYLENOL  Take 1,000 mg by mouth every 6 (six) hours as needed for pain.     albuterol 108 (90 BASE) MCG/ACT inhaler  Commonly known as:  PROVENTIL HFA;VENTOLIN HFA  Inhale 2 puffs into the lungs every 6 (six) hours as needed. For shortness of breath     ALPRAZolam 0.25 MG tablet  Commonly known as:  XANAX  Take 0.25 mg by mouth at bedtime as needed. For sleep     amiodarone 200 MG tablet  Commonly known as:  PACERONE  Take 200 mg by mouth daily.     aspirin 81 MG tablet  Take 81 mg by mouth daily.     DEXILANT 60 MG capsule  Generic drug:  dexlansoprazole  Take 60 mg by mouth daily.     furosemide 40 MG tablet  Commonly known as:  LASIX  Take 40 mg by mouth daily at 12 noon.     glimepiride 1 MG tablet  Commonly known as:  AMARYL  Take 1 mg by mouth daily before breakfast.     isosorbide mononitrate 60 MG 24 hr tablet  Commonly known as:  IMDUR  Take 60 mg by mouth every morning.     levothyroxine 50 MCG tablet  Commonly known as:  SYNTHROID, LEVOTHROID  Take 50 mcg by mouth daily at 12 noon.     lubiprostone 24 MCG capsule  Commonly known as:  AMITIZA  Take 24 mcg by mouth 2 (two) times daily with a meal.     nitroGLYCERIN 0.4 MG SL tablet  Commonly known as:  NITROSTAT  Place 0.4 mg under the tongue every 5 (five) minutes as needed for chest pain.     oxyCODONE 5 MG immediate release tablet  Commonly known as:  Oxy IR/ROXICODONE  Take 1-2 tablets (5-10 mg  total) by mouth every 4 (four) hours as needed for pain.     ranitidine 300 MG capsule  Commonly known as:  ZANTAC  Take 300 mg by mouth every evening.     rosuvastatin 10 MG tablet  Commonly known as:  CRESTOR  Take 1 tablet (10 mg total) by mouth daily.     tamsulosin 0.4 MG Caps  Commonly known as:  FLOMAX  Take 0.4 mg by mouth at bedtime.        Patients skin is clean, dry and intact, no evidence of skin break down. IV site discontinued and catheter remains intact. Site without signs and symptoms of complications. Dressing and pressure applied.  Patient escorted to car by NT in a wheelchair,  no distress noted upon discharge.  Laural Benes, Lounell Schumacher C 05/05/2012 7:08 PM

## 2012-05-05 NOTE — Evaluation (Signed)
Occupational Therapy Evaluation and Discharge Patient Details Name: [name] MRN: [id number] DOB: [birth year] Today's Date: 05/05/2012 Time: 7829-5621 OT Time Calculation (min): 14 min  OT Assessment / Plan / Recommendation Clinical Impression  This 79 yo male admitted for GIB presents to acute OT at an I/Mod I level. No further OT needs identified, will sign off.    OT Assessment  Patient does not need any further OT services    Follow Up Recommendations  No OT follow up       Equipment Recommendations  None recommended by OT          Precautions / Restrictions Precautions Precautions: Fall Restrictions Weight Bearing Restrictions: No       ADL  Transfers/Ambulation Related to ADLs: Mod I for all transfers and ambulation sometimes with quad cane and sometimes without quad cane. ADL Comments: Mod I for all ADLS        Visit Information  Last OT Received On: 05/05/12 Assistance Needed: +1    Subjective Data  Subjective: Sometimes I don't need my cane   Prior Functioning     Home Living Lives With: Alone Type of Home: Apartment Home Access: Elevator Home Layout: One level Bathroom Shower/Tub: Other (comment) Home Adaptive Equipment: Quad cane Prior Function Level of Independence: Independent with assistive device(s) Driving: Yes Vocation: Retired Musician: No difficulties         Vision/Perception Vision - History Baseline Vision: Bifocals Patient Visual Report: No change from baseline   Cognition  Cognition Overall Cognitive Status: Appears within functional limits for tasks assessed/performed Arousal/Alertness: Awake/alert Orientation Level: Appears intact for tasks assessed Behavior During Session: [facility] for tasks performed    Extremity/Trunk Assessment Right Upper Extremity Assessment RUE ROM/Strength/Tone: WFL for tasks assessed RUE Coordination: WFL - gross/fine motor Left Upper Extremity Assessment LUE  ROM/Strength/Tone: WFL for tasks assessed LUE Coordination: WFL - gross/fine motor     Mobility Bed Mobility Details for Bed Mobility Assistance: Sitting in chair upon arrival Transfers Transfers: Sit to Stand;Stand to Sit Sit to Stand: 6: Modified independent (Device/Increase time);With upper extremity assist;With armrests;From chair/3-in-1 Stand to Sit: 6: Modified independent (Device/Increase time);With upper extremity assist;With armrests;To chair/3-in-1 Details for Transfer Assistance: Used right hand on quad cane and left hand on armrest to stand.  Used both hands on armrest to sit          Balance Balance Balance Assessed: Yes Static Sitting Balance Static Sitting - Balance Support: Feet supported;No upper extremity supported Static Sitting - Level of Assistance: 7: Independent Static Sitting - Comment/# of Minutes: 5 minutes Dynamic Sitting Balance Dynamic Sitting - Balance Support: No upper extremity supported;Feet supported;During functional activity Dynamic Sitting - Level of Assistance: 6: Modified independent (Device/Increase time) Dynamic Sitting Balance - Compensations: donned and doffed socks Dynamic Sitting - Comments: <1 minute   End of Session OT - End of Session Equipment Utilized During Treatment:  (quad cane) Activity Tolerance: Patient tolerated treatment well Patient left: in chair;with call bell/phone within reach       Evette Georges [phone] 05/05/2012, 3:01 PM

## 2012-05-05 NOTE — Progress Notes (Signed)
NCM spoke to pt and he gave permission to speak to son, Jearld Hemp [phone]. Offered choice for [facility] and provided pt with list. Son agreeable to [facility]. Referral made with [facility] for [facility] PT. Pt has walker. Son states his sister is in the home to assist pt with his care. Isidoro Donning RN CCM Case Mgmt phone [phone]

## 2012-05-05 NOTE — Progress Notes (Signed)
Physical Therapy Treatment Patient Details Name: [name] MRN: [id number] DOB: Aug 03, 1929 Today's Date: 05/05/2012 Time: 3086-5784 PT Time Calculation (min): 11 min  PT Assessment / Plan / Recommendation Comments on Treatment Session  pt presents with GIB.  pt requires increased time to complete mobility without A.  Once standing pt indicating that he wanted to try to have BM and requested to sit on toilet for a while.  RN and Nsg Tech made aware that pt was left in bathroom with pull cord.      Follow Up Recommendations  Home health PT;Supervision - Intermittent     Does the patient have the potential to tolerate intense rehabilitation     Barriers to Discharge        Equipment Recommendations  Rolling walker with 5" wheels    Recommendations for Other Services    Frequency Min 3X/week   Plan Discharge plan remains appropriate;Frequency remains appropriate    Precautions / Restrictions Precautions Precautions: Fall Restrictions Weight Bearing Restrictions: No   Pertinent Vitals/Pain Denies pain.      Mobility  Bed Mobility Bed Mobility: Supine to Sit;Sitting - Scoot to Edge of Bed Supine to Sit: 5: Supervision;With rails Sitting - Scoot to Edge of Bed: 5: Supervision Details for Bed Mobility Assistance: Increased tiem needed to complete without A.   Transfers Transfers: Sit to Stand;Stand to Sit Sit to Stand: 5: Supervision;With upper extremity assist;From bed Stand to Sit: 5: Supervision;With upper extremity assist;To toilet Details for Transfer Assistance: cues to get closer to toilet prior to sitting.   Ambulation/Gait Ambulation/Gait Assistance: 5: Supervision Ambulation Distance (Feet): 10 Feet Assistive device: Rolling walker Ambulation/Gait Assistance Details: cues to stay closer to RW, upright posture.   Gait Pattern: Step-through pattern;Decreased stride length;Trunk flexed Stairs: No Wheelchair Mobility Wheelchair Mobility: No    Exercises     PT  Diagnosis:    PT Problem List:   PT Treatment Interventions:     PT Goals Acute Rehab PT Goals Time For Goal Achievement: 05/16/12 Potential to Achieve Goals: Good PT Goal: Supine/Side to Sit - Progress: Progressing toward goal PT Goal: Sit to Stand - Progress: Progressing toward goal PT Goal: Stand to Sit - Progress: Progressing toward goal PT Goal: Ambulate - Progress: Progressing toward goal  Visit Information  Last PT Received On: 05/05/12 Assistance Needed: +1    Subjective Data  Subjective: I think I need to have a BM.     Cognition  Cognition Overall Cognitive Status: Appears within functional limits for tasks assessed/performed Arousal/Alertness: Awake/alert Orientation Level: Appears intact for tasks assessed Behavior During Session: [facility] for tasks performed    Balance  Balance Balance Assessed: No  End of Session PT - End of Session Equipment Utilized During Treatment: Gait belt Activity Tolerance: Patient tolerated treatment well Patient left:  (In bathroom with pull cord.  RN and Nsg Tech aware.  ) Nurse Communication: Mobility status   GP     Sunny Schlein, [place] [phone] 05/05/2012, 9:59 AM

## 2012-05-06 NOTE — Discharge Summary (Signed)
NAMEJADARIUS, [name] NO.:  [id number]  MEDICAL RECORD NO.:  [id number]  LOCATION:  5522                         FACILITY:  [facility]  PHYSICIAN:  [name], M.D. DATE OF BIRTH:  05-22-1929  DATE OF ADMISSION:  04/29/2012 DATE OF DISCHARGE:  05/05/2012                              DISCHARGE SUMMARY   ADMITTING DIAGNOSES: 1. Acute gastrointestinal bleeding. 2. Coumadin toxicity. 3. Acute anemia secondary to acute gastrointestinal bleeding. 4. History of pulmonary embolism in the past. 5. History of deep venous thrombosis in the past. 6. History of paroxysmal atrial fibrillation in the past. 7. Hypertension. 8. Diabetes mellitus. 9. Coronary artery disease status post multivessel percutaneous     coronary intervention in the past. 10.Ischemic cardiomyopathy. 11.History of systolic heart failure in the past. 12.Hypercholesteremia. 13.Remote tobacco abuse. 14.Chronic kidney disease, stage III. 15.Hypothyroidism. 16.History of pancreatitis. 17.Gastroesophageal reflux disease. 18.Status post bilateral inguinal hernia repair last month.  FINAL DIAGNOSES: 1. Status post acute lower gastrointestinal bleeding status post upper     endoscopy and colonoscopy and cauterization of a bleeding     arteriovenous malformation, status post Coumadin toxicity. 2. Chronic anemia. 3. History of pulmonary embolism in the past. 4. History of deep venous thrombosis in the past. 5. History of paroxysmal atrial fibrillation in the past. 6. Hypertension. 7. Diabetes mellitus. 8. Coronary artery disease status post multivessel percutaneous     coronary intervention in the past. 9. Ischemic cardiomyopathy. 10.History of systolic heart failure in the past. 11.Hypercholesteremia. 12.Remote tobacco abuse. 13.Chronic kidney disease, stage III, stable. 14.Hypothyroidism. 15.History of pancreatitis in the past. 16.Gastroesophageal reflux disease. 17.Status post bilateral  inguinal hernia repair last month.  DISCHARGE HOME MEDICATIONS:  Albuterol inhaler 2 puffs every 6 hours as needed, Xanax 0.25 mg at bedtime as needed, Pacerone 200 mg 1 tablet daily, enteric-coated aspirin 81 mg 1 tablet daily, Dexilant 60 mg 1 tablet daily, Lasix 40 mg twice daily, Amaryl 1 mg daily, Imdur 60 mg 1 tablet every morning, levothyroxine 50 mcg 1 tablet daily, Amitiza 24 mcg 2 times daily with meal as before, Nitrostat 0.4 mg use as directed, oxycodone 5 mg 1 tablet every 4 hours as needed as before, ranitidine 300 mg 1 tablet daily in the evening, Crestor 10 mg 1 tablet daily, Flomax 0.4 mg 1 capsule daily.  The patient has been advised to stop Coumadin and tramadol.  DIET:  Low salt, low cholesterol, 1800 calories ADA diet.  The patient has been advised to monitor blood pressure and blood sugar daily as before.  CONDITION ON DISCHARGE:  Stable.  FOLLOWUP: Follow up with me in 1 week and GI in 2 weeks.  BRIEF HISTORY AND HOSPITAL COURSE:  [name] is an 79 year old male with past medical history significant for multiple medical problems, i.e., coronary artery disease; history of MI x2 in the past status post PTCA stenting to LAD, left circumflex, and RCA in the past; ischemic cardiomyopathy; history of congestive heart failure secondary to systolic dysfunction; history of bilateral PE in the past; history of paroxysmal AFib; hypertension; non-insulin-dependent diabetes mellitus; history of DVT; history of remote tobacco abuse; chronic kidney disease stage III; hypothyroidism; history of pancreatitis in  the past; and GERD.  He came to the ER complaining of vague generalized abdominal pain associated with black tarry stool and feeling dizzy and had brief syncopal episode.  The patient was noted to have hemoglobin of 10.  Last hemoglobin last month was 14.5 g.  The patient denies any chest pain, nausea, vomiting, diaphoresis.  Denies any nonsteroidal  anti- inflammatory medications.  Denies palpitation, lightheadedness, but states feels dizzy and weak.  The patient was noted to have also INR of 4.  Patient was advised yesterday to hold the Coumadin for 2 days and reduce the dose of Coumadin to 4 mg 6 days per week, but today's INR in the ER was noted to be 4.7.  The patient is not sure whether he took his Coumadin yesterday.  PAST MEDICAL HISTORY:  As above.  PAST SURGICAL HISTORY:  He had appendectomy in the past.  Had inguinal hernia repair last month.  Had PTCA stenting in the past.  Had cataract surgery in the past.  Had upper endoscopy and colonoscopy in the past.  PHYSICAL EXAMINATION:  GENERAL:  He was alert, awake, and oriented x3. VITAL SIGNS:  Blood pressure was 162/67, pulse was 81.  He was afebrile. HEENT:  Conjunctiva was pink. NECK:  Supple.  No JVD.  No bruit. LUNGS:  Clear to auscultation without rhonchi or rales. CARDIOVASCULAR:  S1, S2 was normal.  There was soft systolic murmur. There was no S3 gallop. ABDOMEN:  Soft.  Bowel sounds were present.  There was mild generalized tenderness.  No guarding. EXTREMITIES:  There was no clubbing, cyanosis.  There was 1+ edema.  LABORATORY DATA:  His hemoglobin was 10, hematocrit 27.7, white count of 7.0.  Sodium was 141, potassium 3.5, BUN 21, creatinine 1.45.  Repeat electrolytes today sodium 145, potassium 4.2, BUN 12, creatinine 1.36, hemoglobin is 9.1, hematocrit 26.2, white count of 5.9, which has been stable for last few days.  His PT/INR has come back down to baseline PT is 18.2, INR is 1.56.  BRIEF HOSPITAL COURSE:  Patient was admitted to telemetry unit with GI consultation was obtained with [name].  The patient subsequently underwent upper endoscopy and lower endoscopy, and was noted to have bleeding AVM into ascending colon requiring cauterization.  The patient did not had any further episodes of bleeding during the hospital stay. His Coumadin has been  discontinued.  OT/PT consultation was obtained. The patient is ambulating with assistance.  Discussed with patient regarding skilled nursing facility versus discharging home.  The patient states he would like to go home and will be discharged home today and will be followed up in my office in 1 week and GI in 2 weeks.  The patient has been advised to stop Coumadin completely.     Eduardo Osier. Sharyn [name], M.D.     [facility]  D:  05/05/2012  T:  05/06/2012  Job:  [id number]

## 2012-05-10 ENCOUNTER — Emergency Department (HOSPITAL_COMMUNITY): Payer: Medicare Other

## 2012-05-10 ENCOUNTER — Emergency Department (HOSPITAL_COMMUNITY)
Admission: EM | Admit: 2012-05-10 | Discharge: 2012-05-10 | Disposition: A | Discharge status: Home / Self Care | Payer: Medicare Other | Attending: Emergency Medicine | Admitting: Emergency Medicine

## 2012-05-10 ENCOUNTER — Encounter (HOSPITAL_COMMUNITY): Payer: Self-pay | Admitting: *Deleted

## 2012-05-10 DIAGNOSIS — R109 Unspecified abdominal pain: Secondary | ICD-10-CM | POA: Insufficient documentation

## 2012-05-10 DIAGNOSIS — K219 Gastro-esophageal reflux disease without esophagitis: Secondary | ICD-10-CM | POA: Insufficient documentation

## 2012-05-10 DIAGNOSIS — Z8709 Personal history of other diseases of the respiratory system: Secondary | ICD-10-CM | POA: Insufficient documentation

## 2012-05-10 DIAGNOSIS — Z8701 Personal history of pneumonia (recurrent): Secondary | ICD-10-CM | POA: Insufficient documentation

## 2012-05-10 DIAGNOSIS — Z87891 Personal history of nicotine dependence: Secondary | ICD-10-CM | POA: Insufficient documentation

## 2012-05-10 DIAGNOSIS — I10 Essential (primary) hypertension: Secondary | ICD-10-CM | POA: Insufficient documentation

## 2012-05-10 DIAGNOSIS — Z9861 Coronary angioplasty status: Secondary | ICD-10-CM | POA: Insufficient documentation

## 2012-05-10 DIAGNOSIS — Z8669 Personal history of other diseases of the nervous system and sense organs: Secondary | ICD-10-CM | POA: Insufficient documentation

## 2012-05-10 DIAGNOSIS — Z8673 Personal history of transient ischemic attack (TIA), and cerebral infarction without residual deficits: Secondary | ICD-10-CM | POA: Insufficient documentation

## 2012-05-10 DIAGNOSIS — E039 Hypothyroidism, unspecified: Secondary | ICD-10-CM | POA: Insufficient documentation

## 2012-05-10 DIAGNOSIS — E785 Hyperlipidemia, unspecified: Secondary | ICD-10-CM | POA: Insufficient documentation

## 2012-05-10 DIAGNOSIS — Z8639 Personal history of other endocrine, nutritional and metabolic disease: Secondary | ICD-10-CM | POA: Insufficient documentation

## 2012-05-10 DIAGNOSIS — Z7982 Long term (current) use of aspirin: Secondary | ICD-10-CM | POA: Insufficient documentation

## 2012-05-10 DIAGNOSIS — I252 Old myocardial infarction: Secondary | ICD-10-CM | POA: Insufficient documentation

## 2012-05-10 DIAGNOSIS — R110 Nausea: Secondary | ICD-10-CM | POA: Insufficient documentation

## 2012-05-10 DIAGNOSIS — F411 Generalized anxiety disorder: Secondary | ICD-10-CM | POA: Insufficient documentation

## 2012-05-10 DIAGNOSIS — F329 Major depressive disorder, single episode, unspecified: Secondary | ICD-10-CM | POA: Insufficient documentation

## 2012-05-10 DIAGNOSIS — Z87448 Personal history of other diseases of urinary system: Secondary | ICD-10-CM | POA: Insufficient documentation

## 2012-05-10 DIAGNOSIS — E119 Type 2 diabetes mellitus without complications: Secondary | ICD-10-CM | POA: Insufficient documentation

## 2012-05-10 DIAGNOSIS — G8929 Other chronic pain: Secondary | ICD-10-CM

## 2012-05-10 DIAGNOSIS — F3289 Other specified depressive episodes: Secondary | ICD-10-CM | POA: Insufficient documentation

## 2012-05-10 DIAGNOSIS — Z862 Personal history of diseases of the blood and blood-forming organs and certain disorders involving the immune mechanism: Secondary | ICD-10-CM | POA: Insufficient documentation

## 2012-05-10 DIAGNOSIS — M7989 Other specified soft tissue disorders: Secondary | ICD-10-CM | POA: Insufficient documentation

## 2012-05-10 DIAGNOSIS — Z8739 Personal history of other diseases of the musculoskeletal system and connective tissue: Secondary | ICD-10-CM | POA: Insufficient documentation

## 2012-05-10 DIAGNOSIS — Z86718 Personal history of other venous thrombosis and embolism: Secondary | ICD-10-CM | POA: Insufficient documentation

## 2012-05-10 DIAGNOSIS — J45909 Unspecified asthma, uncomplicated: Secondary | ICD-10-CM | POA: Insufficient documentation

## 2012-05-10 DIAGNOSIS — Z79899 Other long term (current) drug therapy: Secondary | ICD-10-CM | POA: Insufficient documentation

## 2012-05-10 LAB — COMPREHENSIVE METABOLIC PANEL
AST: 52 U/L — ABNORMAL HIGH (ref 0–37)
Alkaline Phosphatase: 93 U/L (ref 39–117)
BUN: 12 mg/dL (ref 6–23)
CO2: 26 mEq/L (ref 19–32)
Calcium: 8.7 mg/dL (ref 8.4–10.5)
Chloride: 104 mEq/L (ref 96–112)
GFR calc Af Amer: 46 mL/min — ABNORMAL LOW (ref >90)
GFR calc non Af Amer: 40 mL/min — ABNORMAL LOW (ref >90)
Glucose, Bld: 77 mg/dL (ref 70–99)
Potassium: 4.1 mEq/L (ref 3.5–5.1)
Sodium: 138 mEq/L (ref 135–145)
Total Bilirubin: 0.3 mg/dL (ref 0.3–1.2)
Total Protein: 6.8 g/dL (ref 6.0–8.3)
Total Protein: 7.2 g/dL (ref 6.0–8.3)

## 2012-05-10 LAB — URINALYSIS, ROUTINE W REFLEX MICROSCOPIC
Bilirubin Urine: NEGATIVE
Glucose, UA: NEGATIVE mg/dL
Hgb urine dipstick: NEGATIVE
Specific Gravity, Urine: 1.014 (ref 1.005–1.030)
Urobilinogen, UA: 0.2 mg/dL (ref 0.0–1.0)

## 2012-05-10 LAB — CBC WITH DIFFERENTIAL/PLATELET
Basophils Relative: 0 % (ref 0–1)
Eosinophils Absolute: 0 10*3/uL (ref 0.0–0.7)
HCT: 28.6 % — ABNORMAL LOW (ref 39.0–52.0)
Hemoglobin: 10 g/dL — ABNORMAL LOW (ref 13.0–17.0)
MCH: 28.7 pg (ref 26.0–34.0)
MCHC: 35 g/dL (ref 30.0–36.0)
Monocytes Absolute: 0.7 10*3/uL (ref 0.1–1.0)
Monocytes Relative: 12 % (ref 3–12)
Neutro Abs: 3.7 10*3/uL (ref 1.7–7.7)

## 2012-05-10 MED ORDER — MORPHINE SULFATE 4 MG/ML IJ SOLN
4.0000 mg | Freq: Once | INTRAMUSCULAR | Status: AC
  Administered 2012-05-10: 4 mg via INTRAVENOUS
  Filled 2012-05-10: qty 1

## 2012-05-10 MED ORDER — IOHEXOL 300 MG/ML  SOLN
100.0000 mL | Freq: Once | INTRAMUSCULAR | Status: AC | PRN
  Administered 2012-05-10: 100 mL via INTRAVENOUS

## 2012-05-10 MED ORDER — SODIUM CHLORIDE 0.9 % IV SOLN
INTRAVENOUS | Status: DC
  Administered 2012-05-10: 17:00:00 via INTRAVENOUS

## 2012-05-10 MED ORDER — IOHEXOL 300 MG/ML  SOLN
50.0000 mL | Freq: Once | INTRAMUSCULAR | Status: AC | PRN
  Administered 2012-05-10: 50 mL via ORAL

## 2012-05-10 NOTE — ED Notes (Signed)
Per ems: pt from home, had surgery 3/7 to have 2 hernias removed, pt has had abd pain and nausea since. Was seen at [facility] 4/14 for eval, has been evaluated by surgeon post-op as well. Pt c/o bilat lower leg pain, some swelling noted. bp 148/70, pulse 76, respirations 18, saO2 96% ra

## 2012-05-10 NOTE — ED Notes (Signed)
[id number]<WG> Expected date:<BR> Expected time:<BR> Means of arrival:<BR> Comments:<BR> EMS 79 y/o hernia

## 2012-05-10 NOTE — ED Provider Notes (Signed)
History     CSN: [id number]  Arrival date & time 05/10/12  1604   First MD Initiated Contact with Patient 05/10/12 1605      Chief Complaint  Patient presents with  . Abdominal Pain  . Nausea    (Consider location/radiation/quality/duration/timing/severity/associated sxs/prior treatment) HPI Complains of lower abdominal pain, suprapubic onset approximately 4 months ago since before he had hernia repaired. Pain is constant he denies nausea. He denies appetite change 8 well today last bowel movement this morning which was normal denies urinary symptoms denies fever pain is worse with eating not improved by anything . He is treated himself with some sort of pain medication which is not recall, without relief. No other associated symptom Past Medical History  Diagnosis Date  . GERD (gastroesophageal reflux disease)   . Hypothyroidism   . Chronic back pain   . Stroke   . Gout     no problems in 2 years  . Pulmonary embolism, bilateral 2010  . Hyperlipemia   . Myocardial infarction 1987; 1989  . Anginal pain   . DVT, lower extremity 1980's    LLE  . Asthma     "touch"  . Pneumonia 1970's  . Renal disorder ~ 03/2011    "wasn't functioning right"  . Arthritis     "mostly in my knees and shoulders"  . Stroke     "just a little one"  . Collapsed lung     "for 14 years" right   . Diabetes mellitus   . Hypertension   . Depression   . Anxiety   . Hearing loss   . Glaucoma    paroxysmal atrial fibrillation  Past Surgical History  Procedure Laterality Date  . Esophagogastroduodenoscopy  12/22/2010    Procedure: ESOPHAGOGASTRODUODENOSCOPY (EGD);  Surgeon: Theda Belfast;  Location: [facility] ENDOSCOPY;  Service: Endoscopy;  Laterality: N/A;  . Appendectomy  1950  . Cataract extraction w/ intraocular lens  implant, bilateral Bilateral 1990's  . Cardiac catheterization    . Coronary angioplasty with stent placement      "think I have 4 total"  . Inguinal hernia repair Bilateral  03/28/2012    Procedure: LAPAROSCOPIC BILATERAL INGUINAL HERNIA REPAIR;  Surgeon: Ardeth Sportsman, MD;  Location: [facility] ORS;  Service: General;  Laterality: Bilateral;  . Insertion of mesh Bilateral 03/28/2012    Procedure: INSERTION OF MESH;  Surgeon: Ardeth Sportsman, MD;  Location: [facility] ORS;  Service: General;  Laterality: Bilateral;  . Hernia repair    . Esophagogastroduodenoscopy N/A 04/30/2012    Procedure: ESOPHAGOGASTRODUODENOSCOPY (EGD);  Surgeon: Theda Belfast, MD;  Location: [facility] ENDOSCOPY;  Service: Endoscopy;  Laterality: N/A;  . Colonoscopy N/A 05/01/2012    Procedure: COLONOSCOPY;  Surgeon: Theda Belfast, MD;  Location: [facility] ENDOSCOPY;  Service: Endoscopy;  Laterality: N/A;    Family History  Problem Relation Age of Onset  . Diabetes Mother   . Heart disease Father   . Lung cancer Brother   . Stroke Neg Hx   . Hypertension Neg Hx   . Hyperlipidemia Neg Hx   . Alcohol abuse Neg Hx     History  Substance Use Topics  . Smoking status: Former Smoker -- 2.00 packs/day for 3 years    Types: Cigarettes    Start date: 01/27/1946    Quit date: 01/22/1981  . Smokeless tobacco: Never Used  . Alcohol Use: No     Comment: 07/12/2011; "last drink of alcohol was ~ 1983"  Review of Systems  Cardiovascular: Positive for leg swelling.       Chronic leg edema  Gastrointestinal: Positive for abdominal pain.  All other systems reviewed and are negative.    Allergies  Review of patient's allergies indicates no known allergies.  Home Medications   Current Outpatient Rx  Name  Route  Sig  Dispense  Refill  . acetaminophen (TYLENOL) 500 MG tablet   Oral   Take 1,000 mg by mouth every 6 (six) hours as needed for pain.          Marland Kitchen albuterol (PROVENTIL HFA;VENTOLIN HFA) 108 (90 BASE) MCG/ACT inhaler   Inhalation   Inhale 2 puffs into the lungs every 6 (six) hours as needed. For shortness of breath         . ALPRAZolam (XANAX) 0.25 MG tablet   Oral   Take 0.25 mg by mouth at  bedtime as needed. For sleep         . amiodarone (PACERONE) 200 MG tablet   Oral   Take 200 mg by mouth daily.          Marland Kitchen aspirin 81 MG tablet   Oral   Take 81 mg by mouth daily.         Marland Kitchen dexlansoprazole (DEXILANT) 60 MG capsule   Oral   Take 60 mg by mouth daily.          . furosemide (LASIX) 40 MG tablet   Oral   Take 40 mg by mouth daily at 12 noon.          Marland Kitchen glimepiride (AMARYL) 1 MG tablet   Oral   Take 1 mg by mouth daily before breakfast.          . isosorbide mononitrate (IMDUR) 60 MG 24 hr tablet   Oral   Take 60 mg by mouth every morning.          Marland Kitchen levothyroxine (SYNTHROID, LEVOTHROID) 50 MCG tablet   Oral   Take 50 mcg by mouth daily at 12 noon.          . lubiprostone (AMITIZA) 24 MCG capsule   Oral   Take 24 mcg by mouth 2 (two) times daily with a meal.         . nitroGLYCERIN (NITROSTAT) 0.4 MG SL tablet   Sublingual   Place 0.4 mg under the tongue every 5 (five) minutes as needed for chest pain.         . ranitidine (ZANTAC) 300 MG capsule   Oral   Take 300 mg by mouth every evening.         . rosuvastatin (CRESTOR) 10 MG tablet   Oral   Take 1 tablet (10 mg total) by mouth daily.   30 tablet   1   . Tamsulosin HCl (FLOMAX) 0.4 MG CAPS   Oral   Take 0.4 mg by mouth at bedtime.            BP 153/68  Pulse 108  Temp(Src) 98 F (36.7 C) (Oral)  Resp 18  SpO2 100%  Physical Exam  Nursing note and vitals reviewed. Constitutional: He appears well-developed and well-nourished.  HENT:  Head: Normocephalic and atraumatic.  Eyes: Conjunctivae are normal. Pupils are equal, round, and reactive to light.  Neck: Neck supple. No tracheal deviation present. No thyromegaly present.  Cardiovascular: Normal rate and regular rhythm.   No murmur heard. Pulmonary/Chest: Effort normal and breath sounds normal.  Abdominal: Soft. Bowel sounds are  normal. He exhibits no distension and no mass. There is tenderness. There is no  rebound and no guarding.  Tenderness and suprapubic area  Genitourinary: Penis normal.  Musculoskeletal: Normal range of motion. He exhibits edema. He exhibits no tenderness.  1+ pretibial pitting edema bilaterally  Neurological: He is alert. Coordination normal.  Skin: Skin is warm and dry. No rash noted.  Psychiatric: He has a normal mood and affect.    ED Course  Procedures (including critical care time)  Labs Reviewed  COMPREHENSIVE METABOLIC PANEL  CBC WITH DIFFERENTIAL  URINALYSIS, ROUTINE W REFLEX MICROSCOPIC   No results found.  Results for orders placed during the hospital encounter of 05/10/12  COMPREHENSIVE METABOLIC PANEL      Result Value Range   Sodium 138  135 - 145 mEq/L   Potassium HEMOLYZED SPECIMEN, RESULTS MAY BE AFFECTED  3.5 - 5.1 mEq/L   Chloride 105  96 - 112 mEq/L   CO2 26  19 - 32 mEq/L   Glucose, Bld 90  70 - 99 mg/dL   BUN 13  6 - 23 mg/dL   Creatinine, Ser 4.54 (*) 0.50 - 1.35 mg/dL   Calcium 8.7  8.4 - 09.8 mg/dL   Total Protein 6.8  6.0 - 8.3 g/dL   Albumin 2.9 (*) 3.5 - 5.2 g/dL   AST 52 (*) 0 - 37 U/L   ALT HEMOLYZED SPECIMEN, RESULTS MAY BE AFFECTED  0 - 53 U/L   Alkaline Phosphatase HEMOLYZED SPECIMEN, RESULTS MAY BE AFFECTED  39 - 117 U/L   Total Bilirubin 0.3  0.3 - 1.2 mg/dL   GFR calc non Af Amer 38 (*) >90 mL/min   GFR calc Af Amer 44 (*) >90 mL/min  CBC WITH DIFFERENTIAL      Result Value Range   WBC 6.1  4.0 - 10.5 K/uL   RBC 3.48 (*) 4.22 - 5.81 MIL/uL   Hemoglobin 10.0 (*) 13.0 - 17.0 g/dL   HCT 11.9 (*) 14.7 - 82.9 %   MCV 82.2  78.0 - 100.0 fL   MCH 28.7  26.0 - 34.0 pg   MCHC 35.0  30.0 - 36.0 g/dL   RDW 56.2 (*) 13.0 - 86.5 %   Platelets 241  150 - 400 K/uL   Neutrophils Relative 61  43 - 77 %   Neutro Abs 3.7  1.7 - 7.7 K/uL   Lymphocytes Relative 27  12 - 46 %   Lymphs Abs 1.6  0.7 - 4.0 K/uL   Monocytes Relative 12  3 - 12 %   Monocytes Absolute 0.7  0.1 - 1.0 K/uL   Eosinophils Relative 1  0 - 5 %    Eosinophils Absolute 0.0  0.0 - 0.7 K/uL   Basophils Relative 0  0 - 1 %   Basophils Absolute 0.0  0.0 - 0.1 K/uL  URINALYSIS, ROUTINE W REFLEX MICROSCOPIC      Result Value Range   Color, Urine YELLOW  YELLOW   APPearance CLEAR  CLEAR   Specific Gravity, Urine 1.014  1.005 - 1.030   pH 7.5  5.0 - 8.0   Glucose, UA NEGATIVE  NEGATIVE mg/dL   Hgb urine dipstick NEGATIVE  NEGATIVE   Bilirubin Urine NEGATIVE  NEGATIVE   Ketones, ur NEGATIVE  NEGATIVE mg/dL   Protein, ur NEGATIVE  NEGATIVE mg/dL   Urobilinogen, UA 0.2  0.0 - 1.0 mg/dL   Nitrite NEGATIVE  NEGATIVE   Leukocytes, UA NEGATIVE  NEGATIVE  COMPREHENSIVE METABOLIC  PANEL      Result Value Range   Sodium 138  135 - 145 mEq/L   Potassium 4.1  3.5 - 5.1 mEq/L   Chloride 104  96 - 112 mEq/L   CO2 26  19 - 32 mEq/L   Glucose, Bld 77  70 - 99 mg/dL   BUN 12  6 - 23 mg/dL   Creatinine, Ser 1.61 (*) 0.50 - 1.35 mg/dL   Calcium 9.0  8.4 - 09.6 mg/dL   Total Protein 7.2  6.0 - 8.3 g/dL   Albumin 3.3 (*) 3.5 - 5.2 g/dL   AST 30  0 - 37 U/L   ALT 25  0 - 53 U/L   Alkaline Phosphatase 93  39 - 117 U/L   Total Bilirubin 0.3  0.3 - 1.2 mg/dL   GFR calc non Af Amer 40 (*) >90 mL/min   GFR calc Af Amer 46 (*) >90 mL/min   Ct Abdomen Pelvis W Contrast  05/10/2012  *RADIOLOGY REPORT*  Clinical Data:  Abdominal pain and nausea, ventral hernia repairs March 2014, past history hypertension, diabetes, coronary disease post MI  CT ABDOMEN AND PELVIS WITH CONTRAST  Technique:  Multidetector CT imaging of the abdomen and pelvis was performed following the standard protocol during bolus administration of intravenous contrast. Sagittal and coronal MPR images reconstructed from axial data set.  Contrast: OMNIPAQUE IOHEXOL 300 MG/ML  SOLN Dilute oral contrast.  Comparison: 02/01/2011  Findings: Atherosclerotic calcifications aorta, iliac arteries and coronary arteries. Dependent atelectasis at lung bases. Large bilateral renal cysts. Liver, spleen,  pancreas, and kidneys otherwise normal appearance. Normal right adrenal gland with small nodule left adrenal nodules again seen. Soft tissue thickening at umbilicus question related to prior surgery.  Significantly enlarged prostate gland 5.9 x 5.8 x 6.6 cm. Mild diffuse bladder wall thickening may be related to chronic outlet obstruction. Sigmoid and distal descending colonic diverticulosis without definite evidence of acute diverticulitis. Mild fat distention of inguinal canals with mild associated edema extending into the prevesical space question related to recent surgery. Small fluid collection identified along the right inguinal canal / spermatic cord, 11 x 10 mm, new, could represent a sterile or infected collection. No mass, adenopathy, free intraperitoneal fluid, or free air. No acute osseous findings.  IMPRESSION: Sigmoid diverticulosis without evidence of diverticulitis. Marked enlargement of prostate gland with question chronic bladder outlet obstruction. Suspected postsurgical changes at the inguinal canals bilaterally with persistent fat distention of the inguinal canals bilaterally. Tiny new fluid collection along the right spermatic cord which could be sterile or infected. Large bilateral renal cysts and left adrenal nodules.   Original Report Authenticated By: Ulyses Southward, M.D.      No diagnosis found. 5:45 PM patient pain improved after treatment with intravenous morphine patient alert appropriate    MDM  No serious etiology of abdominal pain found. Pain is chronic. Patient states she's done well with Tylenol for similar pain in the past Plan Tylenol for pain followup with Dr.Harwani Diagnosis#1 chronic abdominal pain #2 renal insufficiency       Doug Sou, MD 05/10/12 2030

## 2012-05-10 NOTE — ED Notes (Signed)
Spoke with lab, will redraw labs

## 2012-05-15 ENCOUNTER — Emergency Department (HOSPITAL_COMMUNITY): Payer: Medicare Other

## 2012-05-15 ENCOUNTER — Encounter (HOSPITAL_COMMUNITY): Payer: Self-pay | Admitting: *Deleted

## 2012-05-15 ENCOUNTER — Emergency Department (HOSPITAL_COMMUNITY)
Admission: EM | Admit: 2012-05-15 | Discharge: 2012-05-16 | Disposition: A | Discharge status: Home / Self Care | Payer: Medicare Other | Attending: Emergency Medicine | Admitting: Emergency Medicine

## 2012-05-15 ENCOUNTER — Encounter (HOSPITAL_COMMUNITY): Payer: Self-pay | Admitting: Emergency Medicine

## 2012-05-15 ENCOUNTER — Emergency Department (HOSPITAL_COMMUNITY)
Admission: EM | Admit: 2012-05-15 | Discharge: 2012-05-15 | Disposition: A | Discharge status: Home / Self Care | Payer: Medicare Other | Attending: Emergency Medicine | Admitting: Emergency Medicine

## 2012-05-15 DIAGNOSIS — Z8679 Personal history of other diseases of the circulatory system: Secondary | ICD-10-CM | POA: Insufficient documentation

## 2012-05-15 DIAGNOSIS — Z9861 Coronary angioplasty status: Secondary | ICD-10-CM | POA: Insufficient documentation

## 2012-05-15 DIAGNOSIS — Z86718 Personal history of other venous thrombosis and embolism: Secondary | ICD-10-CM | POA: Insufficient documentation

## 2012-05-15 DIAGNOSIS — M1710 Unilateral primary osteoarthritis, unspecified knee: Secondary | ICD-10-CM | POA: Insufficient documentation

## 2012-05-15 DIAGNOSIS — E039 Hypothyroidism, unspecified: Secondary | ICD-10-CM | POA: Insufficient documentation

## 2012-05-15 DIAGNOSIS — Z8701 Personal history of pneumonia (recurrent): Secondary | ICD-10-CM | POA: Insufficient documentation

## 2012-05-15 DIAGNOSIS — E785 Hyperlipidemia, unspecified: Secondary | ICD-10-CM | POA: Insufficient documentation

## 2012-05-15 DIAGNOSIS — E119 Type 2 diabetes mellitus without complications: Secondary | ICD-10-CM | POA: Insufficient documentation

## 2012-05-15 DIAGNOSIS — R110 Nausea: Secondary | ICD-10-CM | POA: Insufficient documentation

## 2012-05-15 DIAGNOSIS — Z8659 Personal history of other mental and behavioral disorders: Secondary | ICD-10-CM | POA: Insufficient documentation

## 2012-05-15 DIAGNOSIS — F3289 Other specified depressive episodes: Secondary | ICD-10-CM | POA: Insufficient documentation

## 2012-05-15 DIAGNOSIS — Z8669 Personal history of other diseases of the nervous system and sense organs: Secondary | ICD-10-CM | POA: Insufficient documentation

## 2012-05-15 DIAGNOSIS — Z862 Personal history of diseases of the blood and blood-forming organs and certain disorders involving the immune mechanism: Secondary | ICD-10-CM | POA: Insufficient documentation

## 2012-05-15 DIAGNOSIS — M549 Dorsalgia, unspecified: Secondary | ICD-10-CM | POA: Insufficient documentation

## 2012-05-15 DIAGNOSIS — Z87891 Personal history of nicotine dependence: Secondary | ICD-10-CM | POA: Insufficient documentation

## 2012-05-15 DIAGNOSIS — R109 Unspecified abdominal pain: Secondary | ICD-10-CM | POA: Insufficient documentation

## 2012-05-15 DIAGNOSIS — J45909 Unspecified asthma, uncomplicated: Secondary | ICD-10-CM | POA: Insufficient documentation

## 2012-05-15 DIAGNOSIS — H9190 Unspecified hearing loss, unspecified ear: Secondary | ICD-10-CM | POA: Insufficient documentation

## 2012-05-15 DIAGNOSIS — Z8673 Personal history of transient ischemic attack (TIA), and cerebral infarction without residual deficits: Secondary | ICD-10-CM | POA: Insufficient documentation

## 2012-05-15 DIAGNOSIS — K219 Gastro-esophageal reflux disease without esophagitis: Secondary | ICD-10-CM | POA: Insufficient documentation

## 2012-05-15 DIAGNOSIS — Z86711 Personal history of pulmonary embolism: Secondary | ICD-10-CM | POA: Insufficient documentation

## 2012-05-15 DIAGNOSIS — Z9089 Acquired absence of other organs: Secondary | ICD-10-CM | POA: Insufficient documentation

## 2012-05-15 DIAGNOSIS — I10 Essential (primary) hypertension: Secondary | ICD-10-CM | POA: Insufficient documentation

## 2012-05-15 DIAGNOSIS — F411 Generalized anxiety disorder: Secondary | ICD-10-CM | POA: Insufficient documentation

## 2012-05-15 DIAGNOSIS — M19019 Primary osteoarthritis, unspecified shoulder: Secondary | ICD-10-CM | POA: Insufficient documentation

## 2012-05-15 DIAGNOSIS — Z8739 Personal history of other diseases of the musculoskeletal system and connective tissue: Secondary | ICD-10-CM | POA: Insufficient documentation

## 2012-05-15 DIAGNOSIS — Z79899 Other long term (current) drug therapy: Secondary | ICD-10-CM | POA: Insufficient documentation

## 2012-05-15 DIAGNOSIS — G8929 Other chronic pain: Secondary | ICD-10-CM | POA: Insufficient documentation

## 2012-05-15 DIAGNOSIS — Z8639 Personal history of other endocrine, nutritional and metabolic disease: Secondary | ICD-10-CM | POA: Insufficient documentation

## 2012-05-15 DIAGNOSIS — I252 Old myocardial infarction: Secondary | ICD-10-CM | POA: Insufficient documentation

## 2012-05-15 DIAGNOSIS — F329 Major depressive disorder, single episode, unspecified: Secondary | ICD-10-CM | POA: Insufficient documentation

## 2012-05-15 DIAGNOSIS — G8918 Other acute postprocedural pain: Secondary | ICD-10-CM | POA: Insufficient documentation

## 2012-05-15 DIAGNOSIS — Z7982 Long term (current) use of aspirin: Secondary | ICD-10-CM | POA: Insufficient documentation

## 2012-05-15 DIAGNOSIS — Z8709 Personal history of other diseases of the respiratory system: Secondary | ICD-10-CM | POA: Insufficient documentation

## 2012-05-15 DIAGNOSIS — Z87448 Personal history of other diseases of urinary system: Secondary | ICD-10-CM | POA: Insufficient documentation

## 2012-05-15 DIAGNOSIS — H409 Unspecified glaucoma: Secondary | ICD-10-CM | POA: Insufficient documentation

## 2012-05-15 LAB — URINALYSIS, ROUTINE W REFLEX MICROSCOPIC
Hgb urine dipstick: NEGATIVE
Protein, ur: NEGATIVE mg/dL
Specific Gravity, Urine: 1.014 (ref 1.005–1.030)
Urobilinogen, UA: 0.2 mg/dL (ref 0.0–1.0)

## 2012-05-15 LAB — COMPREHENSIVE METABOLIC PANEL
AST: 31 U/L (ref 0–37)
Albumin: 2.9 g/dL — ABNORMAL LOW (ref 3.5–5.2)
Alkaline Phosphatase: 74 U/L (ref 39–117)
Chloride: 105 mEq/L (ref 96–112)
Potassium: 3.7 mEq/L (ref 3.5–5.1)
Sodium: 140 mEq/L (ref 135–145)
Total Bilirubin: 0.3 mg/dL (ref 0.3–1.2)
Total Protein: 6.5 g/dL (ref 6.0–8.3)

## 2012-05-15 LAB — CBC
MCHC: 34.8 g/dL (ref 30.0–36.0)
Platelets: 237 10*3/uL (ref 150–400)
RDW: 15.7 % — ABNORMAL HIGH (ref 11.5–15.5)
WBC: 6.9 10*3/uL (ref 4.0–10.5)

## 2012-05-15 MED ORDER — FENTANYL CITRATE 0.05 MG/ML IJ SOLN
50.0000 ug | INTRAMUSCULAR | Status: DC | PRN
  Administered 2012-05-15 (×3): 50 ug via INTRAVENOUS
  Filled 2012-05-15 (×3): qty 2

## 2012-05-15 MED ORDER — SODIUM CHLORIDE 0.9 % IV SOLN
INTRAVENOUS | Status: DC
  Administered 2012-05-15: 04:00:00 via INTRAVENOUS

## 2012-05-15 MED ORDER — POLYETHYLENE GLYCOL 3350 17 GM/SCOOP PO POWD: 17.0000 g | Freq: Every day | ORAL | Status: DC

## 2012-05-15 MED ORDER — ONDANSETRON HCL 4 MG/2ML IJ SOLN
4.0000 mg | Freq: Once | INTRAMUSCULAR | Status: AC
  Administered 2012-05-15: 4 mg via INTRAVENOUS
  Filled 2012-05-15: qty 2

## 2012-05-15 NOTE — ED Notes (Signed)
Per EMS: pt was seen here for abdominal pain this morning, diagnosed with constipation did not get miralax rx filled.

## 2012-05-15 NOTE — ED Provider Notes (Signed)
History     CSN: [id number]  Arrival date & time 05/15/12  0235   First MD Initiated Contact with Patient 05/15/12 [phone]      Chief Complaint  Patient presents with  . Abdominal Pain    (Consider location/radiation/quality/duration/timing/severity/associated sxs/prior treatment) HPI Hx per PT - BIB EMS for ongoing lower ABD pain R and Left side, going on for months now, has had multiple work ups for this, has seen GI and now been referred to [facility] (saw Dr Elnoria Howard in the clinic earlier today), pain started after hernia repair. Takes tylenol at home, pain worse after eating. Hurts all of the time, no known alleviating factors at home, when he comes to the ER he gets relief from IV medications. No F/C. Some nasuea no V/D. Still has his GB - no known h/o gallstones Past Medical History  Diagnosis Date  . GERD (gastroesophageal reflux disease)   . Hypothyroidism   . Chronic back pain   . Stroke   . Gout     no problems in 2 years  . Pulmonary embolism, bilateral 2010  . Hyperlipemia   . Myocardial infarction 1987; 1989  . Anginal pain   . DVT, lower extremity 1980's    LLE  . Asthma     "touch"  . Pneumonia 1970's  . Renal disorder ~ 03/2011    "wasn't functioning right"  . Arthritis     "mostly in my knees and shoulders"  . Stroke     "just a little one"  . Collapsed lung     "for 14 years" right   . Diabetes mellitus   . Hypertension   . Depression   . Anxiety   . Hearing loss   . Glaucoma     Past Surgical History  Procedure Laterality Date  . Esophagogastroduodenoscopy  12/22/2010    Procedure: ESOPHAGOGASTRODUODENOSCOPY (EGD);  Surgeon: Theda Belfast;  Location: [facility];  Service: Endoscopy;  Laterality: N/A;  . Appendectomy  1950  . Cataract extraction w/ intraocular lens  implant, bilateral Bilateral 1990's  . Cardiac catheterization    . Coronary angioplasty with stent placement      "think I have 4 total"  . Inguinal hernia repair Bilateral  03/28/2012    Procedure: LAPAROSCOPIC BILATERAL INGUINAL HERNIA REPAIR;  Surgeon: Ardeth Sportsman, MD;  Location: [facility];  Service: General;  Laterality: Bilateral;  . Insertion of mesh Bilateral 03/28/2012    Procedure: INSERTION OF MESH;  Surgeon: Ardeth Sportsman, MD;  Location: [facility];  Service: General;  Laterality: Bilateral;  . Hernia repair    . Esophagogastroduodenoscopy N/A 04/30/2012    Procedure: ESOPHAGOGASTRODUODENOSCOPY (EGD);  Surgeon: Theda Belfast, MD;  Location: [facility] ENDOSCOPY;  Service: Endoscopy;  Laterality: N/A;  . Colonoscopy N/A 05/01/2012    Procedure: COLONOSCOPY;  Surgeon: Theda Belfast, MD;  Location: [facility] ENDOSCOPY;  Service: Endoscopy;  Laterality: N/A;    Family History  Problem Relation Age of Onset  . Diabetes Mother   . Heart disease Father   . Lung cancer Brother   . Stroke Neg Hx   . Hypertension Neg Hx   . Hyperlipidemia Neg Hx   . Alcohol abuse Neg Hx     History  Substance Use Topics  . Smoking status: Former Smoker -- 2.00 packs/day for 3 years    Types: Cigarettes    Start date: 01/27/1946    Quit date: 01/22/1981  . Smokeless tobacco: Never Used  . Alcohol Use: No  Comment: 07/12/2011; "last drink of alcohol was ~ 1983"      Review of Systems  Constitutional: Negative for fever and chills.  HENT: Negative for neck pain and neck stiffness.   Eyes: Negative for pain.  Respiratory: Negative for shortness of breath.   Cardiovascular: Negative for chest pain.  Gastrointestinal: Positive for nausea and abdominal pain. Negative for vomiting, diarrhea and blood in stool.  Genitourinary: Negative for dysuria.  Musculoskeletal: Negative for back pain.  Skin: Negative for rash.  Neurological: Negative for headaches.  All other systems reviewed and are negative.    Allergies  Review of patient's allergies indicates no known allergies.  Home Medications   Current Outpatient Rx  Name  Route  Sig  Dispense  Refill  . acetaminophen (TYLENOL)  500 MG tablet   Oral   Take 1,000 mg by mouth every 6 (six) hours as needed for pain.          Marland Kitchen albuterol (PROVENTIL HFA;VENTOLIN HFA) 108 (90 BASE) MCG/ACT inhaler   Inhalation   Inhale 2 puffs into the lungs every 6 (six) hours as needed. For shortness of breath         . ALPRAZolam (XANAX) 0.25 MG tablet   Oral   Take 0.25 mg by mouth at bedtime as needed. For sleep         . amiodarone (PACERONE) 200 MG tablet   Oral   Take 200 mg by mouth daily.          Marland Kitchen aspirin 81 MG tablet   Oral   Take 81 mg by mouth daily.         Marland Kitchen dexlansoprazole (DEXILANT) 60 MG capsule   Oral   Take 60 mg by mouth daily.          . furosemide (LASIX) 40 MG tablet   Oral   Take 40 mg by mouth daily at 12 noon.          Marland Kitchen glimepiride (AMARYL) 1 MG tablet   Oral   Take 1 mg by mouth daily before breakfast.          . isosorbide mononitrate (IMDUR) 60 MG 24 hr tablet   Oral   Take 60 mg by mouth every morning.          Marland Kitchen levothyroxine (SYNTHROID, LEVOTHROID) 50 MCG tablet   Oral   Take 50 mcg by mouth daily at 12 noon.          . lubiprostone (AMITIZA) 24 MCG capsule   Oral   Take 24 mcg by mouth 2 (two) times daily with a meal.         . nitroGLYCERIN (NITROSTAT) 0.4 MG SL tablet   Sublingual   Place 0.4 mg under the tongue every 5 (five) minutes as needed for chest pain.         . ranitidine (ZANTAC) 300 MG capsule   Oral   Take 300 mg by mouth every evening.         . rosuvastatin (CRESTOR) 10 MG tablet   Oral   Take 1 tablet (10 mg total) by mouth daily.   30 tablet   1   . Tamsulosin HCl (FLOMAX) 0.4 MG CAPS   Oral   Take 0.4 mg by mouth at bedtime.            BP 166/72  Pulse 73  Temp(Src) 97.8 F (36.6 C) (Oral)  Resp 16  SpO2 98%  Physical Exam  Constitutional:  He is oriented to person, place, and time. He appears well-developed and well-nourished.  HENT:  Head: Normocephalic and atraumatic.  Eyes: EOM are normal. Pupils are  equal, round, and reactive to light. No scleral icterus.  Neck: Neck supple.  Cardiovascular: Regular rhythm and intact distal pulses.   Pulmonary/Chest: Effort normal. No respiratory distress.  Abdominal: Soft. Bowel sounds are normal. He exhibits no distension.  Tender RLQ no acute ABD, no RUQ tenderness and neg Murphys sign  Musculoskeletal: Normal range of motion. He exhibits no edema.  Neurological: He is alert and oriented to person, place, and time.  Skin: Skin is warm and dry.    ED Course  Procedures (including critical care time)  Results for orders placed during the hospital encounter of 05/15/12  CBC      Result Value Range   WBC 6.9  4.0 - 10.5 K/uL   RBC 3.53 (*) 4.22 - 5.81 MIL/uL   Hemoglobin 9.8 (*) 13.0 - 17.0 g/dL   HCT 82.9 (*) 56.2 - 13.0 %   MCV 79.9  78.0 - 100.0 fL   MCH 27.8  26.0 - 34.0 pg   MCHC 34.8  30.0 - 36.0 g/dL   RDW 86.5 (*) 78.4 - 69.6 %   Platelets 237  150 - 400 K/uL  COMPREHENSIVE METABOLIC PANEL      Result Value Range   Sodium 140  135 - 145 mEq/L   Potassium 3.7  3.5 - 5.1 mEq/L   Chloride 105  96 - 112 mEq/L   CO2 27  19 - 32 mEq/L   Glucose, Bld 80  70 - 99 mg/dL   BUN 14  6 - 23 mg/dL   Creatinine, Ser 2.95 (*) 0.50 - 1.35 mg/dL   Calcium 9.1  8.4 - 28.4 mg/dL   Total Protein 6.5  6.0 - 8.3 g/dL   Albumin 2.9 (*) 3.5 - 5.2 g/dL   AST 31  0 - 37 U/L   ALT 23  0 - 53 U/L   Alkaline Phosphatase 74  39 - 117 U/L   Total Bilirubin 0.3  0.3 - 1.2 mg/dL   GFR calc non Af Amer 37 (*) >90 mL/min   GFR calc Af Amer 43 (*) >90 mL/min  LIPASE, BLOOD      Result Value Range   Lipase 47  11 - 59 U/L  URINALYSIS, ROUTINE W REFLEX MICROSCOPIC      Result Value Range   Color, Urine YELLOW  YELLOW   APPearance CLEAR  CLEAR   Specific Gravity, Urine 1.014  1.005 - 1.030   pH 7.0  5.0 - 8.0   Glucose, UA NEGATIVE  NEGATIVE mg/dL   Hgb urine dipstick NEGATIVE  NEGATIVE   Bilirubin Urine NEGATIVE  NEGATIVE   Ketones, ur NEGATIVE  NEGATIVE  mg/dL   Protein, ur NEGATIVE  NEGATIVE mg/dL   Urobilinogen, UA 0.2  0.0 - 1.0 mg/dL   Nitrite NEGATIVE  NEGATIVE   Leukocytes, UA NEGATIVE  NEGATIVE   US Abdomen Complete  05/15/2012  *RADIOLOGY REPORT*  Clinical Data:  Right-sided pain  COMPLETE ABDOMINAL ULTRASOUND  Comparison:  05/10/2012 CT, 02/07/2011 MRI  Findings:  Gallbladder:  No gallstones, gallbladder wall thickening, or pericholecystic fluid.  Common bile duct:  Measures 5 mm, similar to priors, upper normal.  Liver:  No focal lesion identified.  Mild heterogeneous parenchymal echogenicity may reflect technique/artifact versus fatty infiltration or hepatitis.  IVC:  Appears normal.  Pancreas:  Poorly visualized overlying bowel  gas artifact.  Spleen:  Measures 5 cm oblique.  No focal abnormality.  Right Kidney:  Measures 9 cm.  No hydronephrosis.  Multiple cysts, the largest of which measures 4.8 cm and shows thin internal septations.  Lower pole cyst shows a mildly thickened internal septation.  This cyst measures 3.5 cm.  Left Kidney:  Measures 9 cm.  No hydronephrosis.  Large simple/anechoic cyst.  The smaller cystic lesion noted anteriorly on the CT is not visualized due to limitations of an acoustic windows.  Abdominal aorta:  No aneurysm identified.  IMPRESSION: Bilateral renal cysts of varying complexity.  This includes a 3.5 cm lower pole right renal cyst with mildly thickened internal septation.  Recommend 4-month follow-up.  Mild heterogeneous hepatic parenchymal echogenicity may reflect technique/artifact versus fatty infiltration or hepatitis.Correlate with LFTs.  Pancreas not visualized.   Original Report Authenticated By: Jearld Lesch, M.D.    Ct Abdomen Pelvis W Contrast  05/10/2012  *RADIOLOGY REPORT*  Clinical Data:  Abdominal pain and nausea, ventral hernia repairs March 2014, past history hypertension, diabetes, coronary disease post MI  CT ABDOMEN AND PELVIS WITH CONTRAST  Technique:  Multidetector CT imaging of the  abdomen and pelvis was performed following the standard protocol during bolus administration of intravenous contrast. Sagittal and coronal MPR images reconstructed from axial data set.  Contrast: OMNIPAQUE IOHEXOL 300 MG/ML  SOLN Dilute oral contrast.  Comparison: 02/01/2011  Findings: Atherosclerotic calcifications aorta, iliac arteries and coronary arteries. Dependent atelectasis at lung bases. Large bilateral renal cysts. Liver, spleen, pancreas, and kidneys otherwise normal appearance. Normal right adrenal gland with small nodule left adrenal nodules again seen. Soft tissue thickening at umbilicus question related to prior surgery.  Significantly enlarged prostate gland 5.9 x 5.8 x 6.6 cm. Mild diffuse bladder wall thickening may be related to chronic outlet obstruction. Sigmoid and distal descending colonic diverticulosis without definite evidence of acute diverticulitis. Mild fat distention of inguinal canals with mild associated edema extending into the prevesical space question related to recent surgery. Small fluid collection identified along the right inguinal canal / spermatic cord, 11 x 10 mm, new, could represent a sterile or infected collection. No mass, adenopathy, free intraperitoneal fluid, or free air. No acute osseous findings.  IMPRESSION: Sigmoid diverticulosis without evidence of diverticulitis. Marked enlargement of prostate gland with question chronic bladder outlet obstruction. Suspected postsurgical changes at the inguinal canals bilaterally with persistent fat distention of the inguinal canals bilaterally. Tiny new fluid collection along the right spermatic cord which could be sterile or infected. Large bilateral renal cysts and left adrenal nodules.   Original Report Authenticated By: Ulyses Southward, M.D.     IV fentanyl and zofran  MDM  Chronic ABd pain - recent Ct scan for the same reviewed as above. US obtained today and no acute abnormality. Labs reviewed. Improved with IV  narcotics. Scheduled outpatient follow up for symptoms, no indication for admit at this time. VS and nursing notes reviewed.         Sunnie Nielsen, MD 05/15/12 [phone]

## 2012-05-15 NOTE — ED Notes (Signed)
Patient brought in by EMS, called for abdominal pain. Patient states the pain began around 1800 last night. Patient denies any nausea or vomiting at this time. States he has a hx of hernias. Patient awake and oriented, resp are even and unlabored.

## 2012-05-15 NOTE — ED Notes (Signed)
Patient laying on stretcher at this time. Complaining of abdominal pain with some nausea at this time. Patient rates pain 10/10. States he has had hernias in the past. Plan of care discussed with patient. Patient attempting to give urine sample. No acute distress noted, resp are even and unlabored. Call light at bedside. Will continue to monitor.

## 2012-05-16 MED ORDER — HYDROMORPHONE HCL PF 1 MG/ML IJ SOLN
1.0000 mg | Freq: Once | INTRAMUSCULAR | Status: AC
  Administered 2012-05-16: 1 mg via INTRAVENOUS
  Filled 2012-05-16: qty 1

## 2012-05-16 MED ORDER — ONDANSETRON HCL 4 MG/2ML IJ SOLN
4.0000 mg | Freq: Once | INTRAMUSCULAR | Status: AC
  Administered 2012-05-16: 4 mg via INTRAVENOUS
  Filled 2012-05-16: qty 2

## 2012-05-16 NOTE — ED Notes (Signed)
Mediction given following verbal order to proceed by Dr Bebe Shaggy

## 2012-05-16 NOTE — ED Provider Notes (Signed)
Medical screening examination/treatment/procedure(s) were conducted as a shared visit with non-physician practitioner(s) and myself.  I personally evaluated the patient during the encounter   Joya Gaskins, MD 05/16/12 [phone]

## 2012-05-16 NOTE — ED Notes (Signed)
Medication held per Dr Bebe Shaggy order.

## 2012-05-16 NOTE — ED Provider Notes (Signed)
Patient seen/examined in the Emergency Department in conjunction with Midlevel Provider [place] Patient reports abdominal pain Exam : abdomen soft, no focal tenderness, no rebound or guarding, no incarcerated hernia noted Plan: f/u as outpatient for his abdominal pain - pt with recent extensive abdominal imaging while in the ED   Joya Gaskins, MD 05/16/12 [phone]

## 2012-05-16 NOTE — ED Notes (Signed)
Attempted to contact pt's family for third time.  No answer at any of the numbers available

## 2012-05-16 NOTE — ED Notes (Signed)
Attempted to call both the pt's niece and son.  Niece's number stated she could not receive calls at this time, and son did not answer his phone.  Voicemail left with son concerning finding pt a ride home

## 2012-05-16 NOTE — ED Provider Notes (Signed)
History     CSN: [id number]  Arrival date & time 05/15/12  2154   First MD Initiated Contact with Patient 05/15/12 2355      Chief Complaint  Patient presents with  . Abdominal Pain    constipation    (Consider location/radiation/quality/duration/timing/severity/associated sxs/prior treatment) HPI Comments: Patient with history of chronic abdominal pain, recent admission for bleeding AVM, 2 previous emergency department visits in the past week -- presents with continued lower abdominal pain bilaterally that is severe at times. Patient had CT performed on 4/19 which did not show cause of pain. Patient had ultrasound performed last night and lab work this AM which was unremarkable. Patient was improved with IV pain medications. He returns tonight with persistent pain. He states that he has not had a large bowel movement in 5 days. He has had nausea but no vomiting. No fever. Pain is made worse with food. No urinary symptoms. Onset of symptoms acute. Course is intermittent. Nothing makes symptoms better.  The history is provided by the patient and medical records.    Past Medical History  Diagnosis Date  . GERD (gastroesophageal reflux disease)   . Hypothyroidism   . Chronic back pain   . Stroke   . Gout     no problems in 2 years  . Pulmonary embolism, bilateral 2010  . Hyperlipemia   . Myocardial infarction 1987; 1989  . Anginal pain   . DVT, lower extremity 1980's    LLE  . Asthma     "touch"  . Pneumonia 1970's  . Renal disorder ~ 03/2011    "wasn't functioning right"  . Arthritis     "mostly in my knees and shoulders"  . Stroke     "just a little one"  . Collapsed lung     "for 14 years" right   . Diabetes mellitus   . Hypertension   . Depression   . Anxiety   . Hearing loss   . Glaucoma     Past Surgical History  Procedure Laterality Date  . Esophagogastroduodenoscopy  12/22/2010    Procedure: ESOPHAGOGASTRODUODENOSCOPY (EGD);  Surgeon: Theda Belfast;   Location: [facility] ENDOSCOPY;  Service: Endoscopy;  Laterality: N/A;  . Appendectomy  1950  . Cataract extraction w/ intraocular lens  implant, bilateral Bilateral 1990's  . Cardiac catheterization    . Coronary angioplasty with stent placement      "think I have 4 total"  . Inguinal hernia repair Bilateral 03/28/2012    Procedure: LAPAROSCOPIC BILATERAL INGUINAL HERNIA REPAIR;  Surgeon: Ardeth Sportsman, MD;  Location: [facility] ORS;  Service: General;  Laterality: Bilateral;  . Insertion of mesh Bilateral 03/28/2012    Procedure: INSERTION OF MESH;  Surgeon: Ardeth Sportsman, MD;  Location: [facility] ORS;  Service: General;  Laterality: Bilateral;  . Hernia repair    . Esophagogastroduodenoscopy N/A 04/30/2012    Procedure: ESOPHAGOGASTRODUODENOSCOPY (EGD);  Surgeon: Theda Belfast, MD;  Location: [facility] ENDOSCOPY;  Service: Endoscopy;  Laterality: N/A;  . Colonoscopy N/A 05/01/2012    Procedure: COLONOSCOPY;  Surgeon: Theda Belfast, MD;  Location: [facility] ENDOSCOPY;  Service: Endoscopy;  Laterality: N/A;    Family History  Problem Relation Age of Onset  . Diabetes Mother   . Heart disease Father   . Lung cancer Brother   . Stroke Neg Hx   . Hypertension Neg Hx   . Hyperlipidemia Neg Hx   . Alcohol abuse Neg Hx     History  Substance Use Topics  .  Smoking status: Former Smoker -- 2.00 packs/day for 3 years    Types: Cigarettes    Start date: 01/27/1946    Quit date: 01/22/1981  . Smokeless tobacco: Never Used  . Alcohol Use: No     Comment: 07/12/2011; "last drink of alcohol was ~ 1983"      Review of Systems  Constitutional: Negative for fever.  HENT: Negative for sore throat and rhinorrhea.   Eyes: Negative for redness.  Respiratory: Negative for cough.   Cardiovascular: Negative for chest pain.  Gastrointestinal: Positive for nausea, abdominal pain and constipation. Negative for vomiting, diarrhea and blood in stool.  Genitourinary: Negative for dysuria.  Musculoskeletal: Negative for myalgias.   Skin: Negative for rash.  Neurological: Negative for headaches.    Allergies  Review of patient's allergies indicates no known allergies.  Home Medications   Current Outpatient Rx  Name  Route  Sig  Dispense  Refill  . acetaminophen (TYLENOL) 500 MG tablet   Oral   Take 1,000 mg by mouth every 6 (six) hours as needed for pain.          Marland Kitchen albuterol (PROVENTIL HFA;VENTOLIN HFA) 108 (90 BASE) MCG/ACT inhaler   Inhalation   Inhale 2 puffs into the lungs every 6 (six) hours as needed. For shortness of breath         . amiodarone (PACERONE) 200 MG tablet   Oral   Take 200 mg by mouth daily.          Marland Kitchen aspirin 81 MG tablet   Oral   Take 81 mg by mouth daily.         Marland Kitchen dexlansoprazole (DEXILANT) 60 MG capsule   Oral   Take 60 mg by mouth daily.          . furosemide (LASIX) 40 MG tablet   Oral   Take 40 mg by mouth daily at 12 noon.          Marland Kitchen glimepiride (AMARYL) 1 MG tablet   Oral   Take 1 mg by mouth daily before breakfast.          . isosorbide mononitrate (IMDUR) 60 MG 24 hr tablet   Oral   Take 60 mg by mouth every morning.          Marland Kitchen levothyroxine (SYNTHROID, LEVOTHROID) 50 MCG tablet   Oral   Take 50 mcg by mouth daily at 12 noon.          . lubiprostone (AMITIZA) 24 MCG capsule   Oral   Take 24 mcg by mouth 2 (two) times daily with a meal.         . nitroGLYCERIN (NITROSTAT) 0.4 MG SL tablet   Sublingual   Place 0.4 mg under the tongue every 5 (five) minutes as needed for chest pain.         . polyvinyl alcohol (LIQUIFILM TEARS) 1.4 % ophthalmic solution   Both Eyes   Place 1-2 drops into both eyes as needed.         . ranitidine (ZANTAC) 300 MG capsule   Oral   Take 300 mg by mouth every evening.         . rosuvastatin (CRESTOR) 10 MG tablet   Oral   Take 1 tablet (10 mg total) by mouth daily.   30 tablet   1   . Tamsulosin HCl (FLOMAX) 0.4 MG CAPS   Oral   Take 0.4 mg by mouth at bedtime.          Marland Kitchen  polyethylene  glycol powder (GLYCOLAX/MIRALAX) powder   Oral   Take 17 g by mouth daily.   255 g   0     BP 150/61  Pulse 66  Temp(Src) 98.6 F (37 C) (Oral)  Resp 16  SpO2 99%  Physical Exam  Nursing note and vitals reviewed. Constitutional: He appears well-developed and well-nourished.  HENT:  Head: Normocephalic and atraumatic.  Eyes: Conjunctivae are normal. Right eye exhibits no discharge. Left eye exhibits no discharge.  Neck: Normal range of motion. Neck supple.  Cardiovascular: Normal rate, regular rhythm and normal heart sounds.   Pulmonary/Chest: Effort normal and breath sounds normal.  Abdominal: Soft. There is tenderness (mild) in the right lower quadrant, suprapubic area and left lower quadrant. There is no rigidity, no rebound, no guarding, no CVA tenderness and no tenderness at McBurney's point.  Neurological: He is alert.  Skin: Skin is warm and dry.  Psychiatric: He has a normal mood and affect.    ED Course  Procedures (including critical care time)  Labs Reviewed - No data to display US Abdomen Complete  05/15/2012  *RADIOLOGY REPORT*  Clinical Data:  Right-sided pain  COMPLETE ABDOMINAL ULTRASOUND  Comparison:  05/10/2012 CT, 02/07/2011 MRI  Findings:  Gallbladder:  No gallstones, gallbladder wall thickening, or pericholecystic fluid.  Common bile duct:  Measures 5 mm, similar to priors, upper normal.  Liver:  No focal lesion identified.  Mild heterogeneous parenchymal echogenicity may reflect technique/artifact versus fatty infiltration or hepatitis.  IVC:  Appears normal.  Pancreas:  Poorly visualized overlying bowel gas artifact.  Spleen:  Measures 5 cm oblique.  No focal abnormality.  Right Kidney:  Measures 9 cm.  No hydronephrosis.  Multiple cysts, the largest of which measures 4.8 cm and shows thin internal septations.  Lower pole cyst shows a mildly thickened internal septation.  This cyst measures 3.5 cm.  Left Kidney:  Measures 9 cm.  No hydronephrosis.  Large  simple/anechoic cyst.  The smaller cystic lesion noted anteriorly on the CT is not visualized due to limitations of an acoustic windows.  Abdominal aorta:  No aneurysm identified.  IMPRESSION: Bilateral renal cysts of varying complexity.  This includes a 3.5 cm lower pole right renal cyst with mildly thickened internal septation.  Recommend 58-month follow-up.  Mild heterogeneous hepatic parenchymal echogenicity may reflect technique/artifact versus fatty infiltration or hepatitis.Correlate with LFTs.  Pancreas not visualized.   Original Report Authenticated By: Jearld Lesch, M.D.      1. Abdominal pain     12:26 AM Patient seen and examined. Previous records reviewed including CT and [place]. Work-up initiated. Medications ordered.   Vital signs reviewed and are as follows: Filed Vitals:   05/15/12 2159  BP: 150/61  Pulse: 66  Temp: 98.6 F (37 C)  Resp: 16   Patient discussed with Dr. Bebe Shaggy who has seen. Patient has same symptoms, unchanged, for which he has been worked up with labs, CT, [place]. Will treat with IV pain medications.   Will give GI referral -- pt requests 2nd opinion. Encouraged use of Miralax for constipation which he has not done.   Patient unable to get a ride home. Moved to [facility].  The patient was urged to return to the Emergency Department immediately with worsening of current symptoms, worsening abdominal pain, persistent vomiting, blood noted in stools, fever, or any other concerns. The patient verbalized understanding.   5:17 AM Patient's son is coming to get patient. Will d/c to home.  MDM  Abdominal pain, h/o AVM. Patient continues with abdominal pain. He has neg labs, CT, [place]. No changes. Treated with IV narcotics. He will need outpatient f/u. Abd is soft. Vitals are stable, no fever.  No signs of dehydration, tolerating PO's.  Lungs are clear.  No concern for appendicitis, cholecystitis, pancreatitis, ruptured viscus, UTI, kidney stone, or any other abdominal  etiology.  Supportive therapy indicated with return if symptoms worsen.  Patient counseled.         Renne Crigler, PA-C 05/16/12 [phone]

## 2012-05-20 ENCOUNTER — Ambulatory Visit (INDEPENDENT_AMBULATORY_CARE_PROVIDER_SITE_OTHER): Payer: Medicare Other | Admitting: Internal Medicine

## 2012-05-20 ENCOUNTER — Telehealth: Payer: Self-pay | Admitting: Internal Medicine

## 2012-05-20 ENCOUNTER — Encounter: Payer: Self-pay | Admitting: Internal Medicine

## 2012-05-20 VITALS — BP 160/80 | HR 80 | Temp 98.0°F | Resp 16 | Wt 173.4 lb

## 2012-05-20 DIAGNOSIS — F0390 Unspecified dementia without behavioral disturbance: Secondary | ICD-10-CM

## 2012-05-20 DIAGNOSIS — E1165 Type 2 diabetes mellitus with hyperglycemia: Secondary | ICD-10-CM

## 2012-05-20 DIAGNOSIS — N181 Chronic kidney disease, stage 1: Secondary | ICD-10-CM

## 2012-05-20 DIAGNOSIS — K5900 Constipation, unspecified: Secondary | ICD-10-CM

## 2012-05-20 DIAGNOSIS — K5909 Other constipation: Secondary | ICD-10-CM

## 2012-05-20 DIAGNOSIS — I10 Essential (primary) hypertension: Secondary | ICD-10-CM

## 2012-05-20 DIAGNOSIS — E1129 Type 2 diabetes mellitus with other diabetic kidney complication: Secondary | ICD-10-CM

## 2012-05-20 DIAGNOSIS — D510 Vitamin B12 deficiency anemia due to intrinsic factor deficiency: Secondary | ICD-10-CM

## 2012-05-20 MED ORDER — AZILSARTAN MEDOXOMIL 80 MG PO TABS: 1.0000 | ORAL_TABLET | Freq: Every day | ORAL | Status: DC

## 2012-05-20 NOTE — Assessment & Plan Note (Signed)
His last A1C was normal

## 2012-05-20 NOTE — Assessment & Plan Note (Signed)
Start an ARB

## 2012-05-20 NOTE — Progress Notes (Signed)
Subjective:    Patient ID: [name], male    DOB: September 09, 1929, 79 y.o.   MRN: [id number]  Anemia Presents for follow-up visit. Symptoms include abdominal pain (chronic,unchanged), confusion and malaise/fatigue. There has been no anorexia, bruising/bleeding easily, fever, leg swelling, light-headedness, pallor, palpitations, paresthesias, pica or weight loss. Signs of blood loss that are present include hematochezia (he has seen Dr. Elnoria Howard EGD and colonoscopy were done (?lesion in cecum)). Signs of blood loss that are not present include hematemesis, melena, menorrhagia and vaginal bleeding. There are no compliance problems.       Review of Systems  Constitutional: Positive for malaise/fatigue. Negative for fever, chills, weight loss, diaphoresis, activity change, appetite change, fatigue and unexpected weight change.  HENT: Negative.   Eyes: Negative.   Respiratory: Negative.  Negative for apnea, cough, choking, chest tightness, shortness of breath, wheezing and stridor.   Cardiovascular: Negative.  Negative for chest pain, palpitations and leg swelling.  Gastrointestinal: Positive for abdominal pain (chronic,unchanged), hematochezia (he has seen Dr. Elnoria Howard EGD and colonoscopy were done (?lesion in cecum)) and anal bleeding. Negative for nausea, vomiting, diarrhea, constipation, melena, abdominal distention, rectal pain, anorexia and hematemesis.  Endocrine: Negative.   Genitourinary: Negative.  Negative for vaginal bleeding and menorrhagia.  Musculoskeletal: Negative.   Skin: Negative.  Negative for pallor.  Allergic/Immunologic: Negative.   Neurological: Negative.  Negative for dizziness, tremors, weakness, light-headedness, numbness, headaches and paresthesias.  Hematological: Negative.  Negative for adenopathy. Does not bruise/bleed easily.  Psychiatric/Behavioral: Positive for confusion and decreased concentration. Negative for suicidal ideas, hallucinations, behavioral problems, sleep  disturbance, self-injury, dysphoric mood and agitation. The patient is not nervous/anxious and is not hyperactive.        Objective:   Physical Exam  Vitals reviewed. Constitutional: He is oriented to person, place, and time. He appears well-developed and well-nourished.  Non-toxic appearance. He does not have a sickly appearance. He does not appear ill. No distress.  HENT:  Head: Normocephalic and atraumatic.  Mouth/Throat: Oropharynx is clear and moist. No oropharyngeal exudate.  Eyes: Conjunctivae are normal. Right eye exhibits no discharge. Left eye exhibits no discharge. No scleral icterus.  Neck: Normal range of motion. Neck supple. No JVD present. No tracheal deviation present. No thyromegaly present.  Cardiovascular: Normal rate, regular rhythm and intact distal pulses.  Exam reveals no gallop and no friction rub.   No murmur heard. Pulmonary/Chest: Effort normal and breath sounds normal. No stridor. No respiratory distress. He has no wheezes. He has no rales. He exhibits no tenderness.  Abdominal: Soft. Bowel sounds are normal. He exhibits no distension and no mass. There is no tenderness. There is no rebound and no guarding.  Musculoskeletal: Normal range of motion. He exhibits edema (1+ pitting edema in BLE). He exhibits no tenderness.  Lymphadenopathy:    He has no cervical adenopathy.  Neurological: He is oriented to person, place, and time.  Skin: Skin is warm and dry. No rash noted. He is not diaphoretic. No erythema. No pallor.  Psychiatric: He has a normal mood and affect. Judgment and thought content normal. His mood appears not anxious. His affect is not angry and not inappropriate. His speech is delayed and tangential. His speech is not rapid and/or pressured and not slurred. He is slowed. He is not agitated, not aggressive, not hyperactive, not withdrawn, not actively hallucinating and not combative. Thought content is not paranoid and not delusional. Cognition and memory  are impaired. He does not exhibit a depressed mood. He expresses no  homicidal and no suicidal ideation. He expresses no suicidal plans and no homicidal plans. He is communicative. He exhibits abnormal recent memory and abnormal remote memory. He is inattentive.     Lab Results  Component Value Date   WBC 6.9 05/15/2012   HGB 9.8* 05/15/2012   HCT 28.2* 05/15/2012   PLT 237 05/15/2012   GLUCOSE 80 05/15/2012   CHOL 182 07/12/2011   TRIG 88 07/12/2011   HDL 65 07/12/2011   LDLCALC 99 07/12/2011   ALT 23 05/15/2012   AST 31 05/15/2012   NA 140 05/15/2012   K 3.7 05/15/2012   CL 105 05/15/2012   CREATININE 1.67* 05/15/2012   BUN 14 05/15/2012   CO2 27 05/15/2012   TSH 1.483 02/16/2011   PSA 2.41 08/08/2011   INR 1.56* 05/04/2012   HGBA1C 6.1* 04/29/2012       Assessment & Plan:

## 2012-05-20 NOTE — Assessment & Plan Note (Signed)
Start an ARB 

## 2012-05-20 NOTE — Assessment & Plan Note (Signed)
Continue amitiza. 

## 2012-05-20 NOTE — Assessment & Plan Note (Signed)
There is no worthwhile treatment for this

## 2012-05-20 NOTE — Assessment & Plan Note (Signed)
stable °

## 2012-05-20 NOTE — Telephone Encounter (Signed)
Patient Information:  Caller Name: [name]  Phone: [phone]  Patient: [name], [name]  Gender: Male  DOB: [birth year]  Age: 79 Years  PCP: Sanda Linger (Adults only)  Office Follow Up:  Does the office need to follow up with this patient?: Yes  Instructions For The Office: Needs directions for taking Amitiza- "Has not taken for a long time."   Symptoms  Reason For Call & Symptoms: [name]/ Church Friend states [name] was seen in office on 05/20/12  for F/u visit after hernia repair. Has been having constipation and stomach pain x 2 weeks. Was  given 2 samples of medications for stomach pain- Edarbi and Amitiza. [name] calling back to see which sample to give [name]. Thought both meds were for constipation. Explained to [name] that [name] was ordered for High Blood pressure and Amitiza is for Chronic constipation.  [name] states [name] has not taken Amitiza for " a long time." Requesting directions for taking Amitiza. Per [facility] was ordered on 6/19 was ordered Amitiza capsule- Take by mouth two times daily with a meal.  Reviewed Health History In EMR: Yes  Reviewed Medications In EMR: Yes  Reviewed Allergies In EMR: Yes  Reviewed Surgeries / Procedures: Yes  Date of Onset of Symptoms: 05/06/2012  Treatments Tried: Morphine in ED  Treatments Tried Worked: No  Guideline(s) Used:  No Protocol Available - Information Only  Disposition Per Guideline:   Discuss with PCP and Callback by Nurse Today  Reason For Disposition Reached:   Nursing judgment  Advice Given:  Call Back If:  You become worse.  Patient Will Follow Care Advice:  YES

## 2012-05-20 NOTE — Patient Instructions (Addendum)

## 2012-05-21 ENCOUNTER — Encounter (HOSPITAL_COMMUNITY): Payer: Self-pay | Admitting: Emergency Medicine

## 2012-05-21 ENCOUNTER — Emergency Department (HOSPITAL_COMMUNITY)
Admission: EM | Admit: 2012-05-21 | Discharge: 2012-05-21 | Disposition: A | Discharge status: Home / Self Care | Payer: Medicare Other | Attending: Emergency Medicine | Admitting: Emergency Medicine

## 2012-05-21 DIAGNOSIS — G8929 Other chronic pain: Secondary | ICD-10-CM | POA: Insufficient documentation

## 2012-05-21 DIAGNOSIS — Z8669 Personal history of other diseases of the nervous system and sense organs: Secondary | ICD-10-CM | POA: Insufficient documentation

## 2012-05-21 DIAGNOSIS — F329 Major depressive disorder, single episode, unspecified: Secondary | ICD-10-CM | POA: Insufficient documentation

## 2012-05-21 DIAGNOSIS — Z8739 Personal history of other diseases of the musculoskeletal system and connective tissue: Secondary | ICD-10-CM | POA: Insufficient documentation

## 2012-05-21 DIAGNOSIS — Z7982 Long term (current) use of aspirin: Secondary | ICD-10-CM | POA: Insufficient documentation

## 2012-05-21 DIAGNOSIS — Z79899 Other long term (current) drug therapy: Secondary | ICD-10-CM | POA: Insufficient documentation

## 2012-05-21 DIAGNOSIS — I10 Essential (primary) hypertension: Secondary | ICD-10-CM | POA: Insufficient documentation

## 2012-05-21 DIAGNOSIS — F411 Generalized anxiety disorder: Secondary | ICD-10-CM | POA: Insufficient documentation

## 2012-05-21 DIAGNOSIS — Z87448 Personal history of other diseases of urinary system: Secondary | ICD-10-CM | POA: Insufficient documentation

## 2012-05-21 DIAGNOSIS — Z8639 Personal history of other endocrine, nutritional and metabolic disease: Secondary | ICD-10-CM | POA: Insufficient documentation

## 2012-05-21 DIAGNOSIS — F3289 Other specified depressive episodes: Secondary | ICD-10-CM | POA: Insufficient documentation

## 2012-05-21 DIAGNOSIS — K219 Gastro-esophageal reflux disease without esophagitis: Secondary | ICD-10-CM | POA: Insufficient documentation

## 2012-05-21 DIAGNOSIS — R109 Unspecified abdominal pain: Secondary | ICD-10-CM | POA: Insufficient documentation

## 2012-05-21 DIAGNOSIS — Z8709 Personal history of other diseases of the respiratory system: Secondary | ICD-10-CM | POA: Insufficient documentation

## 2012-05-21 DIAGNOSIS — Z862 Personal history of diseases of the blood and blood-forming organs and certain disorders involving the immune mechanism: Secondary | ICD-10-CM | POA: Insufficient documentation

## 2012-05-21 DIAGNOSIS — E785 Hyperlipidemia, unspecified: Secondary | ICD-10-CM | POA: Insufficient documentation

## 2012-05-21 DIAGNOSIS — E119 Type 2 diabetes mellitus without complications: Secondary | ICD-10-CM | POA: Insufficient documentation

## 2012-05-21 DIAGNOSIS — Z8701 Personal history of pneumonia (recurrent): Secondary | ICD-10-CM | POA: Insufficient documentation

## 2012-05-21 DIAGNOSIS — Z86718 Personal history of other venous thrombosis and embolism: Secondary | ICD-10-CM | POA: Insufficient documentation

## 2012-05-21 DIAGNOSIS — Z8679 Personal history of other diseases of the circulatory system: Secondary | ICD-10-CM | POA: Insufficient documentation

## 2012-05-21 DIAGNOSIS — Z87891 Personal history of nicotine dependence: Secondary | ICD-10-CM | POA: Insufficient documentation

## 2012-05-21 DIAGNOSIS — Z8673 Personal history of transient ischemic attack (TIA), and cerebral infarction without residual deficits: Secondary | ICD-10-CM | POA: Insufficient documentation

## 2012-05-21 DIAGNOSIS — R197 Diarrhea, unspecified: Secondary | ICD-10-CM | POA: Insufficient documentation

## 2012-05-21 DIAGNOSIS — Z86711 Personal history of pulmonary embolism: Secondary | ICD-10-CM | POA: Insufficient documentation

## 2012-05-21 DIAGNOSIS — I252 Old myocardial infarction: Secondary | ICD-10-CM | POA: Insufficient documentation

## 2012-05-21 DIAGNOSIS — E039 Hypothyroidism, unspecified: Secondary | ICD-10-CM | POA: Insufficient documentation

## 2012-05-21 DIAGNOSIS — J45909 Unspecified asthma, uncomplicated: Secondary | ICD-10-CM | POA: Insufficient documentation

## 2012-05-21 LAB — CBC WITH DIFFERENTIAL/PLATELET
Basophils Absolute: 0 10*3/uL (ref 0.0–0.1)
Eosinophils Absolute: 0 10*3/uL (ref 0.0–0.7)
Eosinophils Relative: 1 % (ref 0–5)
Lymphocytes Relative: 23 % (ref 12–46)
MCV: 80.8 fL (ref 78.0–100.0)
Platelets: 245 10*3/uL (ref 150–400)
RDW: 15.8 % — ABNORMAL HIGH (ref 11.5–15.5)
WBC: 6.5 10*3/uL (ref 4.0–10.5)

## 2012-05-21 LAB — COMPREHENSIVE METABOLIC PANEL
ALT: 21 U/L (ref 0–53)
AST: 34 U/L (ref 0–37)
Albumin: 3 g/dL — ABNORMAL LOW (ref 3.5–5.2)
Calcium: 8.8 mg/dL (ref 8.4–10.5)
Sodium: 138 mEq/L (ref 135–145)
Total Protein: 6.6 g/dL (ref 6.0–8.3)

## 2012-05-21 LAB — URINALYSIS, ROUTINE W REFLEX MICROSCOPIC
Bilirubin Urine: NEGATIVE
Hgb urine dipstick: NEGATIVE
Specific Gravity, Urine: 1.014 (ref 1.005–1.030)
pH: 6 (ref 5.0–8.0)

## 2012-05-21 MED ORDER — SODIUM CHLORIDE 0.9 % IV BOLUS (SEPSIS)
500.0000 mL | Freq: Once | INTRAVENOUS | Status: AC
  Administered 2012-05-21: 500 mL via INTRAVENOUS

## 2012-05-21 MED ORDER — ONDANSETRON HCL 4 MG/2ML IJ SOLN
4.0000 mg | Freq: Once | INTRAMUSCULAR | Status: AC
  Administered 2012-05-21: 4 mg via INTRAVENOUS
  Filled 2012-05-21: qty 2

## 2012-05-21 MED ORDER — MORPHINE SULFATE 4 MG/ML IJ SOLN
4.0000 mg | Freq: Once | INTRAMUSCULAR | Status: AC
  Administered 2012-05-21: 4 mg via INTRAVENOUS
  Filled 2012-05-21: qty 1

## 2012-05-21 MED ORDER — ONDANSETRON 4 MG PO TBDP: ORAL_TABLET | ORAL | Status: DC

## 2012-05-21 MED ORDER — HYDROCODONE-ACETAMINOPHEN 5-325 MG PO TABS: 2.0000 | ORAL_TABLET | ORAL | Status: DC | PRN

## 2012-05-21 NOTE — ED Provider Notes (Signed)
History     CSN: [id number]  Arrival date & time 05/21/12  1549   First MD Initiated Contact with Patient 05/21/12 1603      Chief Complaint  Patient presents with  . Nausea  . Emesis  . Diarrhea    (Consider location/radiation/quality/duration/timing/severity/associated sxs/prior treatment) HPI Pt with chronic abd pain and is followed by Dr Elnoria Howard. States he has had recent endoscopy and colonoscopy which were normal. He continue to have lower abd pain and states he had several lose BM last night associated with nausea. No fever or chills. No vomiting. Pt states he is hungry at this time. No blood in stool. Recent CT and [place] of abd negative.  Past Medical History  Diagnosis Date  . GERD (gastroesophageal reflux disease)   . Hypothyroidism   . Chronic back pain   . Stroke   . Gout     no problems in 2 years  . Pulmonary embolism, bilateral 2010  . Hyperlipemia   . Myocardial infarction 1987; 1989  . Anginal pain   . DVT, lower extremity 1980's    LLE  . Asthma     "touch"  . Pneumonia 1970's  . Renal disorder ~ 03/2011    "wasn't functioning right"  . Arthritis     "mostly in my knees and shoulders"  . Stroke     "just a little one"  . Collapsed lung     "for 14 years" right   . Diabetes mellitus   . Hypertension   . Depression   . Anxiety   . Hearing loss   . Glaucoma     Past Surgical History  Procedure Laterality Date  . Esophagogastroduodenoscopy  12/22/2010    Procedure: ESOPHAGOGASTRODUODENOSCOPY (EGD);  Surgeon: Theda Belfast;  Location: [facility];  Service: Endoscopy;  Laterality: N/A;  . Appendectomy  1950  . Cataract extraction w/ intraocular lens  implant, bilateral Bilateral 1990's  . Cardiac catheterization    . Coronary angioplasty with stent placement      "think I have 4 total"  . Inguinal hernia repair Bilateral 03/28/2012    Procedure: LAPAROSCOPIC BILATERAL INGUINAL HERNIA REPAIR;  Surgeon: Ardeth Sportsman, MD;  Location: [facility];  Service:  General;  Laterality: Bilateral;  . Insertion of mesh Bilateral 03/28/2012    Procedure: INSERTION OF MESH;  Surgeon: Ardeth Sportsman, MD;  Location: [facility];  Service: General;  Laterality: Bilateral;  . Hernia repair    . Esophagogastroduodenoscopy N/A 04/30/2012    Procedure: ESOPHAGOGASTRODUODENOSCOPY (EGD);  Surgeon: Theda Belfast, MD;  Location: [facility] END[facility]COPY;  Service: Endoscopy;  Laterality: N/A;  . Colonoscopy N/A 05/01/2012    Procedure: COLONOSCOPY;  Surgeon: Theda Belfast, MD;  Location: [facility] ENDOSCOPY;  Service: Endoscopy;  Laterality: N/A;    Family History  Problem Relation Age of Onset  . Diabetes Mother   . Heart disease Father   . Lung cancer Brother   . Stroke Neg Hx   . Hypertension Neg Hx   . Hyperlipidemia Neg Hx   . Alcohol abuse Neg Hx     History  Substance Use Topics  . Smoking status: Former Smoker -- 2.00 packs/day for 3 years    Types: Cigarettes    Start date: 01/27/1946    Quit date: 01/22/1981  . Smokeless tobacco: Never Used  . Alcohol Use: No     Comment: 07/12/2011; "last drink of alcohol was ~ 1983"      Review of Systems  Constitutional: Negative  for fever and chills.  Respiratory: Negative for shortness of breath.   Cardiovascular: Negative for chest pain.  Gastrointestinal: Positive for nausea, abdominal pain and diarrhea. Negative for vomiting, constipation and blood in stool.  Genitourinary: Negative for dysuria.  Musculoskeletal: Negative for myalgias and back pain.  Skin: Negative for rash and wound.  Neurological: Negative for dizziness, weakness, light-headedness, numbness and headaches.  All other systems reviewed and are negative.    Allergies  Review of patient's allergies indicates no known allergies.  Home Medications   Current Outpatient Rx  Name  Route  Sig  Dispense  Refill  . acetaminophen (TYLENOL) 500 MG tablet   Oral   Take 1,000 mg by mouth every 6 (six) hours as needed for pain.          Marland Kitchen albuterol  (PROVENTIL HFA;VENTOLIN HFA) 108 (90 BASE) MCG/ACT inhaler   Inhalation   Inhale 2 puffs into the lungs every 6 (six) hours as needed for shortness of breath.          Marland Kitchen amiodarone (PACERONE) 200 MG tablet   Oral   Take 200 mg by mouth daily.          Marland Kitchen aspirin EC 81 MG tablet   Oral   Take 81 mg by mouth at bedtime.         . Azilsartan Medoxomil (EDARBI) 80 MG TABS   Oral   Take 1 tablet (80 mg total) by mouth daily.   90 tablet   3   . dexlansoprazole (DEXILANT) 60 MG capsule   Oral   Take 60 mg by mouth daily.          . furosemide (LASIX) 40 MG tablet   Oral   Take 20 mg by mouth at bedtime.          Marland Kitchen glimepiride (AMARYL) 1 MG tablet   Oral   Take 1 mg by mouth daily before breakfast.          . isosorbide mononitrate (IMDUR) 60 MG 24 hr tablet   Oral   Take 60 mg by mouth every morning.          Marland Kitchen levothyroxine (SYNTHROID, LEVOTHROID) 50 MCG tablet   Oral   Take 50 mcg by mouth daily before breakfast.          . lubiprostone (AMITIZA) 24 MCG capsule   Oral   Take 24 mcg by mouth 2 (two) times daily with a meal.         . nitroGLYCERIN (NITROSTAT) 0.4 MG SL tablet   Sublingual   Place 0.4 mg under the tongue every 5 (five) minutes as needed for chest pain.         . polyethylene glycol (MIRALAX / GLYCOLAX) packet   Oral   Take 17 g by mouth 2 (two) times daily as needed (for constipation).         . polyvinyl alcohol (LIQUIFILM TEARS) 1.4 % ophthalmic solution   Both Eyes   Place 1-2 drops into both eyes as needed (for dry eyes).          . ranitidine (ZANTAC) 300 MG capsule   Oral   Take 300 mg by mouth every evening.         . rosuvastatin (CRESTOR) 10 MG tablet   Oral   Take 1 tablet (10 mg total) by mouth daily.   30 tablet   1   . Tamsulosin HCl (FLOMAX) 0.4 MG CAPS   Oral  Take 0.4 mg by mouth at bedtime.          Marland Kitchen HYDROcodone-acetaminophen (NORCO) 5-325 MG per tablet   Oral   Take 2 tablets by mouth every  4 (four) hours as needed for pain.   10 tablet   0     BP 150/60  Pulse 61  Temp(Src) 98.7 F (37.1 C) (Oral)  Resp 18  SpO2 98%  Physical Exam  Nursing note and vitals reviewed. Constitutional: He is oriented to person, place, and time. He appears well-developed and well-nourished. No distress.  HENT:  Head: Normocephalic and atraumatic.  Mouth/Throat: Oropharynx is clear and moist.  Eyes: EOM are normal. Pupils are equal, round, and reactive to light.  Neck: Normal range of motion. Neck supple.  Cardiovascular: Normal rate and regular rhythm.   Pulmonary/Chest: Effort normal and breath sounds normal. No respiratory distress. He has no wheezes. He has no rales.  Abdominal: Soft. Bowel sounds are normal. There is tenderness (mild lower abd tenderness without rebound or guarding. ).  Musculoskeletal: Normal range of motion. He exhibits no edema and no tenderness.  Neurological: He is alert and oriented to person, place, and time.  Skin: Skin is warm and dry. No rash noted. No erythema.  Psychiatric: He has a normal mood and affect. His behavior is normal.    ED Course  Procedures (including critical care time)  Labs Reviewed  CBC WITH DIFFERENTIAL - Abnormal; Notable for the following:    RBC 3.75 (*)    Hemoglobin 10.4 (*)    HCT 30.3 (*)    RDW 15.8 (*)    All other components within normal limits  COMPREHENSIVE METABOLIC PANEL - Abnormal; Notable for the following:    Glucose, Bld 133 (*)    Creatinine, Ser 2.00 (*)    Albumin 3.0 (*)    GFR calc non Af Amer 30 (*)    GFR calc Af Amer 35 (*)    All other components within normal limits  URINALYSIS, ROUTINE W REFLEX MICROSCOPIC   No results found.   1. Chronic abdominal pain       MDM  Will screen for emergent conditions and treat symptomatically. Pt will need to continue to follow up with GI.   Pt with improved symptoms. Benign exam. Mild increase in Cr. Advised to f/u with PMD.       Loren Racer, MD 05/22/12 [phone]

## 2012-05-21 NOTE — ED Notes (Signed)
Per EMS-pt states he had hernia surgery in March, now c/o of nausea, vomiting, diarrhea that started last night. Denies chest pain and dizziness. NAD at this time.

## 2012-05-21 NOTE — ED Notes (Signed)
Pt reports he is still nausea and pain has returned.  Rates pain a 9/10.

## 2012-05-21 NOTE — ED Notes (Signed)
[id number]> Expected date:<BR> Expected time:<BR> Means of arrival:<BR> Comments:<BR> N/v/d

## 2012-05-21 NOTE — ED Notes (Signed)
Pt states that he lives by himself at home and is uncomfortable being at home.  Pt states that he is waiting placement on a nursing facility but that has not been completed.  Son of pt called to pick up pt and informed this Clinical research associate that he lives about 1 mile from the pt.  Social work has been consulted and will follow up with case management in regards to pt's living situation at home.

## 2012-05-22 ENCOUNTER — Emergency Department (HOSPITAL_COMMUNITY): Payer: Medicare Other

## 2012-05-22 ENCOUNTER — Encounter (HOSPITAL_COMMUNITY): Payer: Self-pay | Admitting: Emergency Medicine

## 2012-05-22 ENCOUNTER — Emergency Department (HOSPITAL_COMMUNITY)
Admission: EM | Admit: 2012-05-22 | Discharge: 2012-05-22 | Disposition: A | Discharge status: Home / Self Care | Payer: Medicare Other | Attending: Emergency Medicine | Admitting: Emergency Medicine

## 2012-05-22 DIAGNOSIS — R1031 Right lower quadrant pain: Secondary | ICD-10-CM | POA: Insufficient documentation

## 2012-05-22 DIAGNOSIS — I10 Essential (primary) hypertension: Secondary | ICD-10-CM | POA: Insufficient documentation

## 2012-05-22 DIAGNOSIS — F329 Major depressive disorder, single episode, unspecified: Secondary | ICD-10-CM | POA: Insufficient documentation

## 2012-05-22 DIAGNOSIS — E039 Hypothyroidism, unspecified: Secondary | ICD-10-CM | POA: Insufficient documentation

## 2012-05-22 DIAGNOSIS — R300 Dysuria: Secondary | ICD-10-CM | POA: Insufficient documentation

## 2012-05-22 DIAGNOSIS — Z86711 Personal history of pulmonary embolism: Secondary | ICD-10-CM | POA: Insufficient documentation

## 2012-05-22 DIAGNOSIS — I252 Old myocardial infarction: Secondary | ICD-10-CM | POA: Insufficient documentation

## 2012-05-22 DIAGNOSIS — Z87448 Personal history of other diseases of urinary system: Secondary | ICD-10-CM | POA: Insufficient documentation

## 2012-05-22 DIAGNOSIS — J45909 Unspecified asthma, uncomplicated: Secondary | ICD-10-CM | POA: Insufficient documentation

## 2012-05-22 DIAGNOSIS — Z79899 Other long term (current) drug therapy: Secondary | ICD-10-CM | POA: Insufficient documentation

## 2012-05-22 DIAGNOSIS — E785 Hyperlipidemia, unspecified: Secondary | ICD-10-CM | POA: Insufficient documentation

## 2012-05-22 DIAGNOSIS — Z7982 Long term (current) use of aspirin: Secondary | ICD-10-CM | POA: Insufficient documentation

## 2012-05-22 DIAGNOSIS — Z8679 Personal history of other diseases of the circulatory system: Secondary | ICD-10-CM | POA: Insufficient documentation

## 2012-05-22 DIAGNOSIS — F3289 Other specified depressive episodes: Secondary | ICD-10-CM | POA: Insufficient documentation

## 2012-05-22 DIAGNOSIS — Z8673 Personal history of transient ischemic attack (TIA), and cerebral infarction without residual deficits: Secondary | ICD-10-CM | POA: Insufficient documentation

## 2012-05-22 DIAGNOSIS — E119 Type 2 diabetes mellitus without complications: Secondary | ICD-10-CM | POA: Insufficient documentation

## 2012-05-22 DIAGNOSIS — Z8701 Personal history of pneumonia (recurrent): Secondary | ICD-10-CM | POA: Insufficient documentation

## 2012-05-22 DIAGNOSIS — Z86718 Personal history of other venous thrombosis and embolism: Secondary | ICD-10-CM | POA: Insufficient documentation

## 2012-05-22 DIAGNOSIS — R110 Nausea: Secondary | ICD-10-CM | POA: Insufficient documentation

## 2012-05-22 DIAGNOSIS — Z8719 Personal history of other diseases of the digestive system: Secondary | ICD-10-CM | POA: Insufficient documentation

## 2012-05-22 DIAGNOSIS — F411 Generalized anxiety disorder: Secondary | ICD-10-CM | POA: Insufficient documentation

## 2012-05-22 DIAGNOSIS — H9190 Unspecified hearing loss, unspecified ear: Secondary | ICD-10-CM | POA: Insufficient documentation

## 2012-05-22 DIAGNOSIS — G8929 Other chronic pain: Secondary | ICD-10-CM | POA: Insufficient documentation

## 2012-05-22 DIAGNOSIS — Z9889 Other specified postprocedural states: Secondary | ICD-10-CM | POA: Insufficient documentation

## 2012-05-22 DIAGNOSIS — Z87891 Personal history of nicotine dependence: Secondary | ICD-10-CM | POA: Insufficient documentation

## 2012-05-22 DIAGNOSIS — M129 Arthropathy, unspecified: Secondary | ICD-10-CM | POA: Insufficient documentation

## 2012-05-22 DIAGNOSIS — K219 Gastro-esophageal reflux disease without esophagitis: Secondary | ICD-10-CM | POA: Insufficient documentation

## 2012-05-22 DIAGNOSIS — Z9861 Coronary angioplasty status: Secondary | ICD-10-CM | POA: Insufficient documentation

## 2012-05-22 DIAGNOSIS — Z9089 Acquired absence of other organs: Secondary | ICD-10-CM | POA: Insufficient documentation

## 2012-05-22 DIAGNOSIS — Z8709 Personal history of other diseases of the respiratory system: Secondary | ICD-10-CM | POA: Insufficient documentation

## 2012-05-22 LAB — CBC WITH DIFFERENTIAL/PLATELET
Basophils Absolute: 0 10*3/uL (ref 0.0–0.1)
HCT: 30.9 % — ABNORMAL LOW (ref 39.0–52.0)
Lymphocytes Relative: 30 % (ref 12–46)
Lymphs Abs: 2.3 10*3/uL (ref 0.7–4.0)
MCV: 81.3 fL (ref 78.0–100.0)
Neutro Abs: 4.3 10*3/uL (ref 1.7–7.7)
Platelets: 213 10*3/uL (ref 150–400)
RBC: 3.8 MIL/uL — ABNORMAL LOW (ref 4.22–5.81)
RDW: 15.9 % — ABNORMAL HIGH (ref 11.5–15.5)
WBC: 7.6 10*3/uL (ref 4.0–10.5)

## 2012-05-22 LAB — URINALYSIS, ROUTINE W REFLEX MICROSCOPIC
Glucose, UA: NEGATIVE mg/dL
Hgb urine dipstick: NEGATIVE
Specific Gravity, Urine: 1.013 (ref 1.005–1.030)
Urobilinogen, UA: 0.2 mg/dL (ref 0.0–1.0)
pH: 5.5 (ref 5.0–8.0)

## 2012-05-22 LAB — COMPREHENSIVE METABOLIC PANEL
ALT: 19 U/L (ref 0–53)
AST: 23 U/L (ref 0–37)
Alkaline Phosphatase: 75 U/L (ref 39–117)
CO2: 26 mEq/L (ref 19–32)
Chloride: 105 mEq/L (ref 96–112)
GFR calc Af Amer: 39 mL/min — ABNORMAL LOW (ref >90)
GFR calc non Af Amer: 34 mL/min — ABNORMAL LOW (ref >90)
Glucose, Bld: 90 mg/dL (ref 70–99)
Sodium: 139 mEq/L (ref 135–145)
Total Bilirubin: 0.3 mg/dL (ref 0.3–1.2)

## 2012-05-22 LAB — TROPONIN I: Troponin I: <0.3 ng/mL (ref <0.30)

## 2012-05-22 MED ORDER — ONDANSETRON HCL 4 MG/2ML IJ SOLN
4.0000 mg | Freq: Once | INTRAMUSCULAR | Status: AC
  Administered 2012-05-22: 4 mg via INTRAVENOUS
  Filled 2012-05-22: qty 2

## 2012-05-22 MED ORDER — MORPHINE SULFATE 4 MG/ML IJ SOLN
4.0000 mg | Freq: Once | INTRAMUSCULAR | Status: AC
  Administered 2012-05-22: 4 mg via INTRAVENOUS
  Filled 2012-05-22: qty 1

## 2012-05-22 MED ORDER — IOHEXOL 300 MG/ML  SOLN
50.0000 mL | Freq: Once | INTRAMUSCULAR | Status: AC | PRN
  Administered 2012-05-22: 50 mL via ORAL

## 2012-05-22 MED ORDER — SODIUM CHLORIDE 0.9 % IV BOLUS (SEPSIS)
500.0000 mL | Freq: Once | INTRAVENOUS | Status: AC
  Administered 2012-05-22: 500 mL via INTRAVENOUS

## 2012-05-22 NOTE — ED Notes (Signed)
Pt was here last night until 2100. C/o of same symptoms abdominal pain right lower quadrant. Pt had hernias removed about 1 month ago. No physical distress.

## 2012-05-22 NOTE — ED Notes (Signed)
[id number]> Expected date:<BR> Expected time:<BR> Means of arrival:<BR> Comments:<BR> EMS - Abd pain

## 2012-05-22 NOTE — ED Provider Notes (Signed)
History     CSN: [id number]  Arrival date & time 05/22/12  1624   First MD Initiated Contact with Patient 05/22/12 1627      No chief complaint on file.   (Consider location/radiation/quality/duration/timing/severity/associated sxs/prior treatment) HPI Comments: Pt presents to the ED for RLQ abdominal pain.  Seen several times in the ED for the same, most recent last night.  Pt reports he has a hernia removed approx 1 month ago and has had persistent pain since the procedure.  Pain is sharp and non-radiating, localized to RLQ/groin.  Endorses some intermittent nausea with burning sensation and dry heaving but thinks this is related to his GERD.  Also notes mild dysuria but denies any increased frequency or hematuria.  BMs small but formed. Pt followed by GI, Dr. Elnoria Howard, with recent endoscopy revealing schatzki ring, and colonscopy with AVMs noted- otherwise both normal. Recent abd u/s and CT- both negative for acute changes.  Denies and chest pain, SOB, palpitations, dizziness, weakness, fever, sweats, chills, or hematochezia.  The history is provided by the patient.    Past Medical History  Diagnosis Date  . GERD (gastroesophageal reflux disease)   . Hypothyroidism   . Chronic back pain   . Stroke   . Gout     no problems in 2 years  . Pulmonary embolism, bilateral 2010  . Hyperlipemia   . Myocardial infarction 1987; 1989  . Anginal pain   . DVT, lower extremity 1980's    LLE  . Asthma     "touch"  . Pneumonia 1970's  . Renal disorder ~ 03/2011    "wasn't functioning right"  . Arthritis     "mostly in my knees and shoulders"  . Stroke     "just a little one"  . Collapsed lung     "for 14 years" right   . Diabetes mellitus   . Hypertension   . Depression   . Anxiety   . Hearing loss   . Glaucoma     Past Surgical History  Procedure Laterality Date  . Esophagogastroduodenoscopy  12/22/2010    Procedure: ESOPHAGOGASTRODUODENOSCOPY (EGD);  Surgeon: Theda Belfast;   Location: [facility] END[facility]COPY;  Service: Endoscopy;  Laterality: N/A;  . Appendectomy  1950  . Cataract extraction w/ intraocular lens  implant, bilateral Bilateral 1990's  . Cardiac catheterization    . Coronary angioplasty with stent placement      "think I have 4 total"  . Inguinal hernia repair Bilateral 03/28/2012    Procedure: LAPAROSCOPIC BILATERAL INGUINAL HERNIA REPAIR;  Surgeon: Ardeth Sportsman, MD;  Location: [facility] ORS;  Service: General;  Laterality: Bilateral;  . Insertion of mesh Bilateral 03/28/2012    Procedure: INSERTION OF MESH;  Surgeon: Ardeth Sportsman, MD;  Location: [facility];  Service: General;  Laterality: Bilateral;  . Hernia repair    . Esophagogastroduodenoscopy N/A 04/30/2012    Procedure: ESOPHAGOGASTRODUODENOSCOPY (EGD);  Surgeon: Theda Belfast, MD;  Location: [facility] ENDOSCOPY;  Service: Endoscopy;  Laterality: N/A;  . Colonoscopy N/A 05/01/2012    Procedure: COLONOSCOPY;  Surgeon: Theda Belfast, MD;  Location: [facility] ENDOSCOPY;  Service: Endoscopy;  Laterality: N/A;    Family History  Problem Relation Age of Onset  . Diabetes Mother   . Heart disease Father   . Lung cancer Brother   . Stroke Neg Hx   . Hypertension Neg Hx   . Hyperlipidemia Neg Hx   . Alcohol abuse Neg Hx     History  Substance  Use Topics  . Smoking status: Former Smoker -- 2.00 packs/day for 3 years    Types: Cigarettes    Start date: 01/27/1946    Quit date: 01/22/1981  . Smokeless tobacco: Never Used  . Alcohol Use: No     Comment: 07/12/2011; "last drink of alcohol was ~ 1983"      Review of Systems  Gastrointestinal: Positive for abdominal pain.  All other systems reviewed and are negative.    Allergies  Review of patient's allergies indicates no known allergies.  Home Medications   Current Outpatient Rx  Name  Route  Sig  Dispense  Refill  . acetaminophen (TYLENOL) 500 MG tablet   Oral   Take 1,000 mg by mouth every 6 (six) hours as needed for pain.          Marland Kitchen albuterol (PROVENTIL  HFA;VENTOLIN HFA) 108 (90 BASE) MCG/ACT inhaler   Inhalation   Inhale 2 puffs into the lungs every 6 (six) hours as needed for shortness of breath.          Marland Kitchen amiodarone (PACERONE) 200 MG tablet   Oral   Take 200 mg by mouth daily.          Marland Kitchen aspirin EC 81 MG tablet   Oral   Take 81 mg by mouth at bedtime.         . Azilsartan Medoxomil (EDARBI) 80 MG TABS   Oral   Take 1 tablet (80 mg total) by mouth daily.   90 tablet   3   . dexlansoprazole (DEXILANT) 60 MG capsule   Oral   Take 60 mg by mouth daily.          . furosemide (LASIX) 40 MG tablet   Oral   Take 20 mg by mouth at bedtime.          Marland Kitchen glimepiride (AMARYL) 1 MG tablet   Oral   Take 1 mg by mouth daily before breakfast.          . HYDROcodone-acetaminophen (NORCO) 5-325 MG per tablet   Oral   Take 2 tablets by mouth every 4 (four) hours as needed for pain.   10 tablet   0   . isosorbide mononitrate (IMDUR) 60 MG 24 hr tablet   Oral   Take 60 mg by mouth every morning.          Marland Kitchen levothyroxine (SYNTHROID, LEVOTHROID) 50 MCG tablet   Oral   Take 50 mcg by mouth daily before breakfast.          . lubiprostone (AMITIZA) 24 MCG capsule   Oral   Take 24 mcg by mouth 2 (two) times daily with a meal.         . nitroGLYCERIN (NITROSTAT) 0.4 MG SL tablet   Sublingual   Place 0.4 mg under the tongue every 5 (five) minutes as needed for chest pain.         Marland Kitchen ondansetron (ZOFRAN ODT) 4 MG disintegrating tablet      4mg  ODT q4 hours prn nausea/vomit   8 tablet   0   . polyethylene glycol (MIRALAX / GLYCOLAX) packet   Oral   Take 17 g by mouth 2 (two) times daily as needed (for constipation).         . polyvinyl alcohol (LIQUIFILM TEARS) 1.4 % ophthalmic solution   Both Eyes   Place 1-2 drops into both eyes as needed (for dry eyes).          Marland Kitchen  ranitidine (ZANTAC) 300 MG capsule   Oral   Take 300 mg by mouth every evening.         . rosuvastatin (CRESTOR) 10 MG tablet   Oral    Take 1 tablet (10 mg total) by mouth daily.   30 tablet   1   . Tamsulosin HCl (FLOMAX) 0.4 MG CAPS   Oral   Take 0.4 mg by mouth at bedtime.            There were no vitals taken for this visit.  Physical Exam  Nursing note and vitals reviewed. Constitutional: He is oriented to person, place, and time. He appears well-developed and well-nourished.  HENT:  Head: Normocephalic and atraumatic.  Mouth/Throat: Oropharynx is clear and moist and mucous membranes are normal.  Eyes: Conjunctivae and EOM are normal. Pupils are equal, round, and reactive to light.  Neck: Normal range of motion.  Cardiovascular: Normal rate, regular rhythm and normal heart sounds.   Pulmonary/Chest: Effort normal and breath sounds normal.  Abdominal: Soft. Bowel sounds are normal. There is tenderness. There is no CVA tenderness, no tenderness at McBurney's point and negative Murphy's sign.    TTP RLQ and groin, no masses palpated  Musculoskeletal: Normal range of motion.  Neurological: He is alert and oriented to person, place, and time.  Skin: Skin is warm and dry.  Psychiatric: He has a normal mood and affect.    ED Course  Procedures (including critical care time)   Date: 05/22/2012  Rate: 62  Rhythm: normal sinus rhythm  QRS Axis: normal  Intervals: normal  ST/T Wave abnormalities: normal  Conduction Disutrbances:left bundle branch block  Narrative Interpretation: LBBB, no STEMI  Old EKG Reviewed: unchanged    Labs Reviewed  CBC WITH DIFFERENTIAL - Abnormal; Notable for the following:    RBC 3.80 (*)    Hemoglobin 10.6 (*)    HCT 30.9 (*)    RDW 15.9 (*)    All other components within normal limits  COMPREHENSIVE METABOLIC PANEL - Abnormal; Notable for the following:    Creatinine, Ser 1.81 (*)    Albumin 3.3 (*)    GFR calc non Af Amer 34 (*)    GFR calc Af Amer 39 (*)    All other components within normal limits  PROTIME-INR  LIPASE, BLOOD  LACTIC ACID, PLASMA  URINALYSIS,  ROUTINE W REFLEX MICROSCOPIC  TROPONIN I   Ct Abdomen Pelvis Wo Contrast  05/22/2012  *RADIOLOGY REPORT*  Clinical Data: Abdominal pain.  Right lower quadrant pain.  History of herniorrhaphy.  CT ABDOMEN AND PELVIS WITHOUT CONTRAST  Technique:  Multidetector CT imaging of the abdomen and pelvis was performed following the standard protocol without intravenous contrast.  Comparison: CT 05/10/2012.  Findings: Lung Bases: Dependent atelectasis and scarring is present in the lower lobes.  Traction bronchiectasis in the right middle lobe.  Small amount of anterior pericardial fluid or thickening. Cardiomegaly.  Low attenuation intravascular compartment suggests anemia. No effusion or airspace disease.  Liver:  No interval change.  Tiny low density lesion in the left hepatic lobe appears similar.  Allowing for lack of contrast, with portal veins and bile ducts appear unchanged compared to prior.  Spleen:  Normal.  Gallbladder:  Normal.  No calcified gallstones.  Common bile duct:  No interval change.  Grossly normal.  Pancreas:  No inflammatory changes or mass lesion allowing for noncontrast CT. Unenhanced CT was performed per clinician order. Lack of IV contrast limits sensitivity and specificity,  especially for evaluation of abdominal/pelvic solid viscera.  Adrenal glands:  Stable nodules in the left adrenal gland compatible with adenomas.  Adrenal thickening consistent with hyperplasia.  Kidneys:  No calculi or obstruction.  Bilateral renal cysts appears similar to recent prior.   Both ureters appear within normal limits.  Stomach:  Small hiatal hernia and patulous gastroesophageal junction.  No gastric inflammatory changes.  Small bowel:  Duodenum appears normal.  Contrast is present in the proximal small bowel.  There are a few loops of bowel with some mural thickening in the central abdomen which appears greater than expected for the degree of distention.  Although the appearance is nonspecific, these findings  are commonly associated with enteric infection.  The same loops appeared within normal limits on the recent prior examination.  Colon:   Appendix not identified.  Moderate stool burden.  No inflammatory changes of the proximal colon.  Sigmoid diverticulosis.  Pelvic Genitourinary:  Prostatomegaly with marked impression on the inferior urinary bladder.  Bladder diverticulum at the dome.  Mural thickening of the urinary bladder suggesting chronic bladder outflow obstruction.  Stranding in the right inguinal region suggesting prior herniorrhaphy.  Bones:  No aggressive osseous lesions.  Lumbar spondylosis and facet arthrosis.  Vasculature: Atherosclerosis without acute vascular abnormality.  Stranding in the periumbilical region also suggesting herniorrhaphy.  IMPRESSION:  1.  Thickened loops of small bowel in the central upper abdomen. Although nonspecific, this is most commonly associated with enteric infection.  Small bowel ischemia is considered unlikely. Inflammatory bowel disease also considered less likely in this age group.  No perforation or pneumatosis. 2.  Other incidental findings detailed above appears similar to the prior exam.   Original Report Authenticated By: Andreas Newport, M.D.      1. Groin pain, right lower quadrant       MDM   79 y.o. M ED for right lower quadrant/ right groin pain.  Patient seen last night in the ED for the same.  Pain persistent following right hernia repair approximately one month ago.  Recent endoscopy/colonscopy with Dr. Elnoria Howard- both negative.  EKG NSR without evidence of ischemia. Labs largely WNL.  CT scan without acute changes when compared with previous- likely enteric infection vs IBS.  Pain and nausea well controlled in the ED with IVF, Zofran, morphine.  Followup with surgery, Dr. Michaell Cowing to address persistent pain. Increase MiraLax to help ease bowel movements. Discussed plan with patient, he agreed. Discussed with Dr. Manus Gunning who agrees with plan. Return  precautions advised.      Garlon Hatchet, PA-C 05/23/12 1545

## 2012-05-23 NOTE — ED Provider Notes (Signed)
Medical screening examination/treatment/procedure(s) were conducted as a shared visit with non-physician practitioner(s) and myself.  I personally evaluated the patient during the encounter  Recurrent RLQ abdominal pain, recent visits for same, seen last night.  Hernia repair in March. Associated with nausea and constipation.  No distress, Mild RLQ and inguinal region pain. No hernia, no peritoneal signs. Recent US and endoscopy reviewed. BP 171/72  Pulse 68  Temp(Src) 98.5 F (36.9 C) (Oral)  Resp 11  SpO2 93%   Glynn Octave, MD 05/23/12 1623

## 2012-05-26 ENCOUNTER — Ambulatory Visit: Payer: Medicare Other | Admitting: Internal Medicine

## 2012-05-26 DIAGNOSIS — Z0289 Encounter for other administrative examinations: Secondary | ICD-10-CM

## 2012-05-28 ENCOUNTER — Telehealth (INDEPENDENT_AMBULATORY_CARE_PROVIDER_SITE_OTHER): Payer: Self-pay

## 2012-05-28 NOTE — Telephone Encounter (Signed)
LMOM for pt to call me back b/c Dr Michaell Cowing wanted to check on the pt since we saw the pt has had to go to the ER several times in the past few months. I want to check to see if the pt needs to come back in the office for another appt with Dr Michaell Cowing.

## 2012-05-28 NOTE — Telephone Encounter (Signed)
Pt returned my call. The pt states that he is having abdominal pain that causes nausea and vomiting. The pt saw Dr Elnoria Howard this week and the pt said Dr Elnoria Howard can't find out what's wrong with the pt so he is referring the pt to [facility] for a 2nd opinion. The pt is waiting for his appt info. I asked if the pt was having problems with the bilateral inguinal hernia repair and the pt said he has been having trouble since the surgery with the abdominal pain. The pt does not really explain himself well in the conversation. I was on the phone for 15 minutes and really did not get anywhere with the pt except that he is having the abdominal pain. The pt stated he was hurting right then and wanted to throw up. I tried to make an appt for the pt to see Dr Michaell Cowing but he kept telling me that he already saw Dr Michaell Cowing after surgery. I could not make the pt understand really anything except that I would leave a message for Dr Michaell Cowing and call the pt back. The pt requested for me to call his friend at this [phone] but he didn't leave the friend's name.

## 2012-05-29 ENCOUNTER — Telehealth: Payer: Self-pay

## 2012-05-29 NOTE — Telephone Encounter (Signed)
Pt has been seen multiple times in the ER for epigastric pain, abdominal pain. Family states pt is not getting any better that he is just given pain meds and sent home. Requesting pt be seen. Pt scheduled to see Willette Cluster NP tomorrow at 1:30pm. Pt aware of appt date and time.

## 2012-05-30 ENCOUNTER — Encounter: Payer: Self-pay | Admitting: Nurse Practitioner

## 2012-05-30 ENCOUNTER — Ambulatory Visit (INDEPENDENT_AMBULATORY_CARE_PROVIDER_SITE_OTHER): Payer: Medicare Other | Admitting: Nurse Practitioner

## 2012-05-30 VITALS — BP 118/60 | HR 72 | Wt 176.0 lb

## 2012-05-30 DIAGNOSIS — R109 Unspecified abdominal pain: Secondary | ICD-10-CM

## 2012-05-30 NOTE — Patient Instructions (Addendum)
We are requesting your records from Dr. Elnoria Howard.  We will review them and call you with an appointment to see Dr. Erick Blinks here.

## 2012-05-30 NOTE — Progress Notes (Signed)
HPI :   Patient is an 79 year old male with multiple medical problems. He has chronic abdominal pain followed by Elnoria Howard. Patient seen by Dr. Elnoria Howard as recently as two days ago. He is self referred for a second opinion. I do not have any of Dr. Haywood Pao office notes / office procedures. I explained to patient and family that it would be difficult to give second opinion without records. Patient signed record release form. We will call and schedule him an appointment when records received.   Past Medical History  Diagnosis Date  . GERD (gastroesophageal reflux disease)   . Hypothyroidism   . Chronic back pain   . Stroke   . Gout     no problems in 2 years  . Pulmonary embolism, bilateral 2010  . Hyperlipemia   . Myocardial infarction 1987; 1989  . Anginal pain   . DVT, lower extremity 1980's    LLE  . Asthma     "touch"  . Pneumonia 1970's  . Renal disorder ~ 03/2011    "wasn't functioning right"  . Arthritis     "mostly in my knees and shoulders"  . Stroke     "just a little one"  . Collapsed lung     "for 14 years" right   . Diabetes mellitus   . Hypertension   . Depression   . Anxiety   . Hearing loss   . Glaucoma       Family History  Problem Relation Age of Onset  . Diabetes Mother   . Heart disease Father   . Lung cancer Brother   . Stroke Neg Hx   . Hypertension Neg Hx   . Hyperlipidemia Neg Hx   . Alcohol abuse Neg Hx    History  Substance Use Topics  . Smoking status: Former Smoker -- 2.00 packs/day for 3 years    Types: Cigarettes    Start date: 01/27/1946    Quit date: 01/22/1981  . Smokeless tobacco: Never Used  . Alcohol Use: No     Comment: 07/12/2011; "last drink of alcohol was ~ 1983"   Current Outpatient Prescriptions  Medication Sig Dispense Refill  . acetaminophen (TYLENOL) 500 MG tablet Take 1,000 mg by mouth every 6 (six) hours as needed for pain.       Marland Kitchen albuterol (PROVENTIL HFA;VENTOLIN HFA) 108 (90 BASE) MCG/ACT inhaler Inhale 2 puffs into  the lungs every 6 (six) hours as needed for shortness of breath.       Marland Kitchen amiodarone (PACERONE) 200 MG tablet Take 200 mg by mouth daily.       Marland Kitchen aspirin EC 81 MG tablet Take 81 mg by mouth at bedtime.      Marland Kitchen dexlansoprazole (DEXILANT) 60 MG capsule Take 60 mg by mouth daily.       . furosemide (LASIX) 40 MG tablet Take 20 mg by mouth at bedtime.       Marland Kitchen glimepiride (AMARYL) 1 MG tablet Take 1 mg by mouth daily before breakfast.       . HYDROcodone-acetaminophen (NORCO) 5-325 MG per tablet Take 2 tablets by mouth every 4 (four) hours as needed for pain.  10 tablet  0  . isosorbide mononitrate (IMDUR) 60 MG 24 hr tablet Take 60 mg by mouth every morning.       Marland Kitchen levothyroxine (SYNTHROID, LEVOTHROID) 50 MCG tablet Take 50 mcg by mouth daily before breakfast.       . lubiprostone (AMITIZA) 24 MCG capsule Take  24 mcg by mouth 2 (two) times daily with a meal.      . nitroGLYCERIN (NITROSTAT) 0.4 MG SL tablet Place 0.4 mg under the tongue every 5 (five) minutes as needed for chest pain.      . polyethylene glycol (MIRALAX / GLYCOLAX) packet Take 17 g by mouth 2 (two) times daily as needed (for constipation).      . polyvinyl alcohol (LIQUIFILM TEARS) 1.4 % ophthalmic solution Place 1-2 drops into both eyes as needed (for dry eyes).       . ranitidine (ZANTAC) 300 MG capsule Take 300 mg by mouth every evening.      . rosuvastatin (CRESTOR) 10 MG tablet Take 1 tablet (10 mg total) by mouth daily.  30 tablet  1  . Tamsulosin HCl (FLOMAX) 0.4 MG CAPS Take 0.4 mg by mouth at bedtime.        No current facility-administered medications for this visit.   No Known Allergies   Physical Exam: BP 118/60  Pulse 72  Wt 176 lb (79.833 kg)  BMI 26.37 kg/m2 Patient in NAD.  Assessment / Plan: Chronic abdominal pain. Patient followed by Dr. Elnoria Howard but self-referred for second opinion. He will be rescheduled here when records received from Dr. Haywood Pao office.   \

## 2012-05-31 ENCOUNTER — Encounter (HOSPITAL_COMMUNITY): Payer: Self-pay | Admitting: Emergency Medicine

## 2012-05-31 ENCOUNTER — Emergency Department (HOSPITAL_COMMUNITY): Payer: Medicare Other

## 2012-05-31 ENCOUNTER — Emergency Department (HOSPITAL_COMMUNITY)
Admission: EM | Admit: 2012-05-31 | Discharge: 2012-05-31 | Disposition: A | Discharge status: Home / Self Care | Payer: Medicare Other | Attending: Emergency Medicine | Admitting: Emergency Medicine

## 2012-05-31 DIAGNOSIS — R1031 Right lower quadrant pain: Secondary | ICD-10-CM | POA: Insufficient documentation

## 2012-05-31 DIAGNOSIS — F329 Major depressive disorder, single episode, unspecified: Secondary | ICD-10-CM | POA: Insufficient documentation

## 2012-05-31 DIAGNOSIS — Z86718 Personal history of other venous thrombosis and embolism: Secondary | ICD-10-CM | POA: Insufficient documentation

## 2012-05-31 DIAGNOSIS — Z8739 Personal history of other diseases of the musculoskeletal system and connective tissue: Secondary | ICD-10-CM | POA: Insufficient documentation

## 2012-05-31 DIAGNOSIS — E039 Hypothyroidism, unspecified: Secondary | ICD-10-CM | POA: Insufficient documentation

## 2012-05-31 DIAGNOSIS — M549 Dorsalgia, unspecified: Secondary | ICD-10-CM | POA: Insufficient documentation

## 2012-05-31 DIAGNOSIS — Z8679 Personal history of other diseases of the circulatory system: Secondary | ICD-10-CM | POA: Insufficient documentation

## 2012-05-31 DIAGNOSIS — I252 Old myocardial infarction: Secondary | ICD-10-CM | POA: Insufficient documentation

## 2012-05-31 DIAGNOSIS — K219 Gastro-esophageal reflux disease without esophagitis: Secondary | ICD-10-CM | POA: Insufficient documentation

## 2012-05-31 DIAGNOSIS — J45909 Unspecified asthma, uncomplicated: Secondary | ICD-10-CM | POA: Insufficient documentation

## 2012-05-31 DIAGNOSIS — Z87448 Personal history of other diseases of urinary system: Secondary | ICD-10-CM | POA: Insufficient documentation

## 2012-05-31 DIAGNOSIS — Z8701 Personal history of pneumonia (recurrent): Secondary | ICD-10-CM | POA: Insufficient documentation

## 2012-05-31 DIAGNOSIS — E785 Hyperlipidemia, unspecified: Secondary | ICD-10-CM | POA: Insufficient documentation

## 2012-05-31 DIAGNOSIS — Z87891 Personal history of nicotine dependence: Secondary | ICD-10-CM | POA: Insufficient documentation

## 2012-05-31 DIAGNOSIS — Z7982 Long term (current) use of aspirin: Secondary | ICD-10-CM | POA: Insufficient documentation

## 2012-05-31 DIAGNOSIS — Z8709 Personal history of other diseases of the respiratory system: Secondary | ICD-10-CM | POA: Insufficient documentation

## 2012-05-31 DIAGNOSIS — F411 Generalized anxiety disorder: Secondary | ICD-10-CM | POA: Insufficient documentation

## 2012-05-31 DIAGNOSIS — Z8673 Personal history of transient ischemic attack (TIA), and cerebral infarction without residual deficits: Secondary | ICD-10-CM | POA: Insufficient documentation

## 2012-05-31 DIAGNOSIS — F3289 Other specified depressive episodes: Secondary | ICD-10-CM | POA: Insufficient documentation

## 2012-05-31 DIAGNOSIS — K222 Esophageal obstruction: Secondary | ICD-10-CM

## 2012-05-31 DIAGNOSIS — E119 Type 2 diabetes mellitus without complications: Secondary | ICD-10-CM | POA: Insufficient documentation

## 2012-05-31 DIAGNOSIS — R109 Unspecified abdominal pain: Secondary | ICD-10-CM

## 2012-05-31 DIAGNOSIS — Z79899 Other long term (current) drug therapy: Secondary | ICD-10-CM | POA: Insufficient documentation

## 2012-05-31 DIAGNOSIS — I10 Essential (primary) hypertension: Secondary | ICD-10-CM | POA: Insufficient documentation

## 2012-05-31 DIAGNOSIS — Z86711 Personal history of pulmonary embolism: Secondary | ICD-10-CM | POA: Insufficient documentation

## 2012-05-31 DIAGNOSIS — G8929 Other chronic pain: Secondary | ICD-10-CM | POA: Insufficient documentation

## 2012-05-31 LAB — COMPREHENSIVE METABOLIC PANEL
ALT: 14 U/L (ref 0–53)
AST: 20 U/L (ref 0–37)
CO2: 28 mEq/L (ref 19–32)
Chloride: 110 mEq/L (ref 96–112)
Creatinine, Ser: 1.58 mg/dL — ABNORMAL HIGH (ref 0.50–1.35)
GFR calc non Af Amer: 40 mL/min — ABNORMAL LOW (ref >90)
Total Bilirubin: 0.2 mg/dL — ABNORMAL LOW (ref 0.3–1.2)

## 2012-05-31 LAB — CBC WITH DIFFERENTIAL/PLATELET
Basophils Absolute: 0 10*3/uL (ref 0.0–0.1)
HCT: 29.1 % — ABNORMAL LOW (ref 39.0–52.0)
Hemoglobin: 9.6 g/dL — ABNORMAL LOW (ref 13.0–17.0)
Lymphocytes Relative: 31 % (ref 12–46)
Lymphs Abs: 1.9 10*3/uL (ref 0.7–4.0)
Monocytes Absolute: 0.9 10*3/uL (ref 0.1–1.0)
Neutro Abs: 3.3 10*3/uL (ref 1.7–7.7)
RBC: 3.59 MIL/uL — ABNORMAL LOW (ref 4.22–5.81)
RDW: 15.5 % (ref 11.5–15.5)
WBC: 6.1 10*3/uL (ref 4.0–10.5)

## 2012-05-31 LAB — URINALYSIS, ROUTINE W REFLEX MICROSCOPIC
Glucose, UA: NEGATIVE mg/dL
Hgb urine dipstick: NEGATIVE
Ketones, ur: NEGATIVE mg/dL
Protein, ur: NEGATIVE mg/dL

## 2012-05-31 MED ORDER — MORPHINE SULFATE 2 MG/ML IJ SOLN
2.0000 mg | Freq: Once | INTRAMUSCULAR | Status: AC
  Administered 2012-05-31: 2 mg via INTRAVENOUS
  Filled 2012-05-31: qty 1

## 2012-05-31 MED ORDER — ONDANSETRON 4 MG PO TBDP: ORAL_TABLET | ORAL | Status: DC

## 2012-05-31 MED ORDER — HYDROCODONE-ACETAMINOPHEN 5-325 MG PO TABS: 1.0000 | ORAL_TABLET | ORAL | Status: DC | PRN

## 2012-05-31 MED ORDER — DEXTROSE 50 % IV SOLN
50.0000 mL | Freq: Once | INTRAVENOUS | Status: AC
  Administered 2012-05-31: 50 mL via INTRAVENOUS
  Filled 2012-05-31: qty 50

## 2012-05-31 MED ORDER — ONDANSETRON HCL 4 MG/2ML IJ SOLN
4.0000 mg | Freq: Once | INTRAMUSCULAR | Status: AC
  Administered 2012-05-31: 4 mg via INTRAVENOUS
  Filled 2012-05-31: qty 2

## 2012-05-31 NOTE — Consult Note (Signed)
[name]  1929/10/27 [id number]  CARE TEAM:  PCP: Sanda Linger, MD  Outpatient Care Team: Patient Care Team: Etta Grandchild, MD as PCP - General (Internal Medicine) Robynn Pane, MD as Consulting Physician (Cardiology) Theda Belfast, MD as Consulting Physician (Gastroenterology)  Inpatient Treatment Team: Treatment Team: Attending Provider: Loren Racer, MD; Registered Nurse: Harless Nakayama, RN; Technician: Leonel Ramsay, NT  This patient is a 79 y.o.male who presents today for surgical evaluation at the request of Dr. Ranae Palms.   Reason for evaluation: Abdominal pain.  Elderly male with dementia.  I did laparoscopic repair of bilateral inguinal hernias 03/28/2012.  I saw him three weeks later and he seemed to have recovered well.  Still with some chronic heartburn.  Using medications.  Developed anemia and heme positive stools.  Was on warfarin chronically for recurrent pulmonary emboli.  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  They discontinued his warfarin.  He came into the emergency room for a few times for abdominal pain last month.  Last CAT scan nine days ago showed some mild thickened small intestinal loops but no blockage.  No abscess.  No recurrent hernia.  We tried to get him to come to the office.  He seemed to decline and wished to come in.  He returns again emergency room today.  Dr. Ranae Palms requested surgical consultation.  Patient is having worsening heartburn/reflux.  Appetite down.  Feels like he gets crampy abdominal pain 30 minutes after eating.  He notes the abdominal pain is in the lower abdomen.  Somewhat on the right side.  Then radiates to the epigastric region.  Not to the back or shoulder.  One note says he is losing weight.  He thinks he is gaining weight.  Has had a lot of loose watery stools for the past few weeks.  Normally has constipation.  No more rectal bleeding since the  endoscopy by Dr. Elnoria Howard.  No fevers or chills.  No dysphagia to solids or liquids.  Does not recall any sick contacts.  Patient saw gastroenterology yesterday.  He actually does not recall this.  Has some senile dementia.  Ultrasound had been done recently and was normal.    EGD done last month does not show any significant esophagitis.  Small hiatal hernia and Schatzki's ring.  Past Medical History  Diagnosis Date  . GERD (gastroesophageal reflux disease)   . Hypothyroidism   . Chronic back pain   . Stroke   . Gout     no problems in 2 years  . Pulmonary embolism, bilateral 2010  . Hyperlipemia   . Myocardial infarction 1987; 1989  . Anginal pain   . DVT, lower extremity 1980's    LLE  . Asthma     "touch"  . Pneumonia 1970's  . Renal disorder ~ 03/2011    "wasn't functioning right"  . Arthritis     "mostly in my knees and shoulders"  . Stroke     "just a little one"  . Collapsed lung     "for 14 years" right   . Diabetes mellitus   . Hypertension   . Depression   . Anxiety   . Hearing loss   . Glaucoma     Past Surgical History  Procedure Laterality Date  . Esophagogastroduodenoscopy  12/22/2010    Procedure: ESOPHAGOGASTRODUODENOSCOPY (EGD);  Surgeon: Theda Belfast;  Location: [facility];  Service: Endoscopy;  Laterality:  N/A;  . Appendectomy  1950  . Cataract extraction w/ intraocular lens  implant, bilateral Bilateral 1990's  . Cardiac catheterization    . Coronary angioplasty with stent placement      "think I have 4 total"  . Inguinal hernia repair Bilateral 03/28/2012    Procedure: LAPAROSCOPIC BILATERAL INGUINAL HERNIA REPAIR;  Surgeon: Ardeth Sportsman, MD;  Location: [facility];  Service: General;  Laterality: Bilateral;  . Insertion of mesh Bilateral 03/28/2012    Procedure: INSERTION OF MESH;  Surgeon: Ardeth Sportsman, MD;  Location: [facility];  Service: General;  Laterality: Bilateral;  . Hernia repair    . Esophagogastroduodenoscopy N/A 04/30/2012    Procedure:  ESOPHAGOGASTRODUODENOSCOPY (EGD);  Surgeon: Theda Belfast, MD;  Location: [facility] ENDOSCOPY;  Service: Endoscopy;  Laterality: N/A;  . Colonoscopy N/A 05/01/2012    Procedure: COLONOSCOPY;  Surgeon: Theda Belfast, MD;  Location: [facility] ENDOSCOPY;  Service: Endoscopy;  Laterality: N/A;    History   Social History  . Marital Status: Divorced    Spouse Name: N/A    Number of Children: 3  . Years of Education: N/A   Occupational History  . Retired    Social History Main Topics  . Smoking status: Former Smoker -- 2.00 packs/day for 3 years    Types: Cigarettes    Start date: 01/27/1946    Quit date: 01/22/1981  . Smokeless tobacco: Never Used  . Alcohol Use: No     Comment: 07/12/2011; "last drink of alcohol was ~ 1983"  . Drug Use: Yes    Special: Hashish, Marijuana     Comment: last in 1982  . Sexually Active: No   Other Topics Concern  . Not on file   Social History Narrative  . No narrative on file    Family History  Problem Relation Age of Onset  . Diabetes Mother   . Heart disease Father   . Lung cancer Brother   . Stroke Neg Hx   . Hypertension Neg Hx   . Hyperlipidemia Neg Hx   . Alcohol abuse Neg Hx     No current facility-administered medications for this encounter.   Current Outpatient Prescriptions  Medication Sig Dispense Refill  . ALPRAZolam (XANAX) 0.25 MG tablet Take 0.25 mg by mouth at bedtime as needed for sleep.      Marland Kitchen amiodarone (PACERONE) 200 MG tablet Take 200 mg by mouth daily.       Marland Kitchen aspirin EC 81 MG tablet Take 81 mg by mouth at bedtime.      Marland Kitchen dexlansoprazole (DEXILANT) 60 MG capsule Take 60 mg by mouth daily.       . furosemide (LASIX) 40 MG tablet Take 40 mg by mouth at bedtime.       Marland Kitchen glimepiride (AMARYL) 1 MG tablet Take 1 mg by mouth daily before breakfast.       . isosorbide mononitrate (IMDUR) 60 MG 24 hr tablet Take 60 mg by mouth every morning.       Marland Kitchen levothyroxine (SYNTHROID, LEVOTHROID) 50 MCG tablet Take 50 mcg by mouth daily before  breakfast.       . lubiprostone (AMITIZA) 24 MCG capsule Take 24 mcg by mouth 2 (two) times daily with a meal.      . nitroGLYCERIN (NITROSTAT) 0.4 MG SL tablet Place 0.4 mg under the tongue every 5 (five) minutes as needed for chest pain.      . ranitidine (ZANTAC) 300 MG capsule Take 300 mg by  mouth every evening.      . rosuvastatin (CRESTOR) 10 MG tablet Take 1 tablet (10 mg total) by mouth daily.  30 tablet  1  . Tamsulosin HCl (FLOMAX) 0.4 MG CAPS Take 0.4 mg by mouth at bedtime.       Marland Kitchen acetaminophen (TYLENOL) 500 MG tablet Take 1,000 mg by mouth every 6 (six) hours as needed for pain.       Marland Kitchen albuterol (PROVENTIL HFA;VENTOLIN HFA) 108 (90 BASE) MCG/ACT inhaler Inhale 2 puffs into the lungs every 6 (six) hours as needed for shortness of breath.       Marland Kitchen HYDROcodone-acetaminophen (NORCO) 5-325 MG per tablet Take 2 tablets by mouth every 4 (four) hours as needed for pain.  10 tablet  0  . polyethylene glycol (MIRALAX / GLYCOLAX) packet Take 17 g by mouth 2 (two) times daily as needed (for constipation).      . polyvinyl alcohol (LIQUIFILM TEARS) 1.4 % ophthalmic solution Place 1-2 drops into both eyes as needed (for dry eyes).          No Known Allergies  ROS: Constitutional:  No fevers, chills, sweats.  Weight stable Eyes:  No vision changes, No discharge HENT:  No sore throats, nasal drainage Lymph: No neck swelling, No bruising easily Pulmonary:  No cough, productive sputum CV: No orthopnea, PND  No exertional chest/neck/shoulder/arm pain. GI: No personal nor family history of GI/colon cancer, inflammatory bowel disease, irritable bowel syndrome, allergy such as Celiac Sprue, dietary/dairy problems, colitis, ulcers nor gastritis.  No recent sick contacts/gastroenteritis.  No travel outside the country.  No changes in diet. Renal: No UTIs, No hematuria Genital:  No drainage, bleeding, masses Musculoskeletal: No severe joint pain.  Good ROM major joints Skin:  No sores or lesions.  No  rashes Heme/Lymph:  No easy bleeding.  No swollen lymph nodes Neuro: No focal weakness/numbness.  No seizures Psych: No suicidal ideation.  No hallucinations  BP 153/69  Pulse 78  Temp(Src) 98.5 F (36.9 C) (Oral)  Resp 18  SpO2 98%  Physical Exam: General: Pt awake/alert/oriented x3 in no major acute distress.  Calm.  Not toxic. Eyes: PERRL, normal EOM. Sclera nonicteric Neuro: CN II-XII intact w/o focal sensory/motor deficits. Lymph: No head/neck/groin lymphadenopathy Psych:  No delerium/psychosis/paranoia.   Tends to ramble and get tangential.  Somewhat redirectable HENT: Normocephalic, Mucus membranes moist.  No thrush Neck: Supple, No tracheal deviation Chest: No pain.  Good respiratory excursion. CV:  Pulses intact.  Regular rhythm Abdomen: Soft, Nondistended.  Periumbilical laparoscopic incisions closed and well-healed.  Min tender BLQ/epigastric.  No Murphy's sign.  No tenderness at McBurney's point.  No guarding.  No evidence of peritonitis.  No incarcerated hernias. GU: Number external genitalia.  No inguinal hernias.  No hematoma or seroma.  No tenderness there. Ext:  SCDs BLE.  No significant edema.  No cyanosis Skin: No petechiae / purpurea.  No major sores Musculoskeletal: No severe joint pain.  Good ROM major joints   Results:   Labs: Results for orders placed during the hospital encounter of 05/31/12 (from the past 48 hour(s))  CBC WITH DIFFERENTIAL     Status: Abnormal   Collection Time    05/31/12  4:30 PM      Result Value Range   WBC 6.1  4.0 - 10.5 K/uL   RBC 3.59 (*) 4.22 - 5.81 MIL/uL   Hemoglobin 9.6 (*) 13.0 - 17.0 g/dL   HCT 16.1 (*) 09.6 - 04.5 %  MCV 81.1  78.0 - 100.0 fL   MCH 26.7  26.0 - 34.0 pg   MCHC 33.0  30.0 - 36.0 g/dL   RDW 16.1  09.6 - 04.5 %   Platelets 152  150 - 400 K/uL   Neutrophils Relative 54  43 - 77 %   Neutro Abs 3.3  1.7 - 7.7 K/uL   Lymphocytes Relative 31  12 - 46 %   Lymphs Abs 1.9  0.7 - 4.0 K/uL   Monocytes  Relative 14 (*) 3 - 12 %   Monocytes Absolute 0.9  0.1 - 1.0 K/uL   Eosinophils Relative 1  0 - 5 %   Eosinophils Absolute 0.1  0.0 - 0.7 K/uL   Basophils Relative 0  0 - 1 %   Basophils Absolute 0.0  0.0 - 0.1 K/uL    Imaging / Studies: Ct Abdomen Pelvis Wo Contrast  05/22/2012  *RADIOLOGY REPORT*  Clinical Data: Abdominal pain.  Right lower quadrant pain.  History of herniorrhaphy.  CT ABDOMEN AND PELVIS WITHOUT CONTRAST  Technique:  Multidetector CT imaging of the abdomen and pelvis was performed following the standard protocol without intravenous contrast.  Comparison: CT 05/10/2012.  Findings: Lung Bases: Dependent atelectasis and scarring is present in the lower lobes.  Traction bronchiectasis in the right middle lobe.  Small amount of anterior pericardial fluid or thickening. Cardiomegaly.  Low attenuation intravascular compartment suggests anemia. No effusion or airspace disease.  Liver:  No interval change.  Tiny low density lesion in the left hepatic lobe appears similar.  Allowing for lack of contrast, with portal veins and bile ducts appear unchanged compared to prior.  Spleen:  Normal.  Gallbladder:  Normal.  No calcified gallstones.  Common bile duct:  No interval change.  Grossly normal.  Pancreas:  No inflammatory changes or mass lesion allowing for noncontrast CT. Unenhanced CT was performed per clinician order. Lack of IV contrast limits sensitivity and specificity, especially for evaluation of abdominal/pelvic solid viscera.  Adrenal glands:  Stable nodules in the left adrenal gland compatible with adenomas.  Adrenal thickening consistent with hyperplasia.  Kidneys:  No calculi or obstruction.  Bilateral renal cysts appears similar to recent prior.   Both ureters appear within normal limits.  Stomach:  Small hiatal hernia and patulous gastroesophageal junction.  No gastric inflammatory changes.  Small bowel:  Duodenum appears normal.  Contrast is present in the proximal small bowel.   There are a few loops of bowel with some mural thickening in the central abdomen which appears greater than expected for the degree of distention.  Although the appearance is nonspecific, these findings are commonly associated with enteric infection.  The same loops appeared within normal limits on the recent prior examination.  Colon:   Appendix not identified.  Moderate stool burden.  No inflammatory changes of the proximal colon.  Sigmoid diverticulosis.  Pelvic Genitourinary:  Prostatomegaly with marked impression on the inferior urinary bladder.  Bladder diverticulum at the dome.  Mural thickening of the urinary bladder suggesting chronic bladder outflow obstruction.  Stranding in the right inguinal region suggesting prior herniorrhaphy.  Bones:  No aggressive osseous lesions.  Lumbar spondylosis and facet arthrosis.  Vasculature: Atherosclerosis without acute vascular abnormality.  Stranding in the periumbilical region also suggesting herniorrhaphy.  IMPRESSION:  1.  Thickened loops of small bowel in the central upper abdomen. Although nonspecific, this is most commonly associated with enteric infection.  Small bowel ischemia is considered unlikely. Inflammatory bowel disease also considered  less likely in this age group.  No perforation or pneumatosis. 2.  Other incidental findings detailed above appears similar to the prior exam.   Original Report Authenticated By: Andreas Newport, M.D.    CT scan overall improved [place] for the mildly thickened jejunal loops.  Looks like he may have some biliary dilatation but LFTs are all normal.  No pancreatic head mass obvious.  No bowel structure.  The perforation.  No hematoma/seroma.  US Abdomen Complete  05/15/2012  *RADIOLOGY REPORT*  Clinical Data:  Right-sided pain  COMPLETE ABDOMINAL ULTRASOUND  Comparison:  05/10/2012 CT, 02/07/2011 MRI  Findings:  Gallbladder:  No gallstones, gallbladder wall thickening, or pericholecystic fluid.  Common bile duct:   Measures 5 mm, similar to priors, upper normal.  Liver:  No focal lesion identified.  Mild heterogeneous parenchymal echogenicity may reflect technique/artifact versus fatty infiltration or hepatitis.  IVC:  Appears normal.  Pancreas:  Poorly visualized overlying bowel gas artifact.  Spleen:  Measures 5 cm oblique.  No focal abnormality.  Right Kidney:  Measures 9 cm.  No hydronephrosis.  Multiple cysts, the largest of which measures 4.8 cm and shows thin internal septations.  Lower pole cyst shows a mildly thickened internal septation.  This cyst measures 3.5 cm.  Left Kidney:  Measures 9 cm.  No hydronephrosis.  Large simple/anechoic cyst.  The smaller cystic lesion noted anteriorly on the CT is not visualized due to limitations of an acoustic windows.  Abdominal aorta:  No aneurysm identified.  IMPRESSION: Bilateral renal cysts of varying complexity.  This includes a 3.5 cm lower pole right renal cyst with mildly thickened internal septation.  Recommend 70-month follow-up.  Mild heterogeneous hepatic parenchymal echogenicity may reflect technique/artifact versus fatty infiltration or hepatitis.Correlate with LFTs.  Pancreas not visualized.   Original Report Authenticated By: Jearld Lesch, M.D.    Ct Abdomen Pelvis W Contrast  05/10/2012  *RADIOLOGY REPORT*  Clinical Data:  Abdominal pain and nausea, ventral hernia repairs March 2014, past history hypertension, diabetes, coronary disease post MI  CT ABDOMEN AND PELVIS WITH CONTRAST  Technique:  Multidetector CT imaging of the abdomen and pelvis was performed following the standard protocol during bolus administration of intravenous contrast. Sagittal and coronal MPR images reconstructed from axial data set.  Contrast: OMNIPAQUE IOHEXOL 300 MG/ML  SOLN Dilute oral contrast.  Comparison: 02/01/2011  Findings: Atherosclerotic calcifications aorta, iliac arteries and coronary arteries. Dependent atelectasis at lung bases. Large bilateral renal cysts.  Liver, spleen, pancreas, and kidneys otherwise normal appearance. Normal right adrenal gland with small nodule left adrenal nodules again seen. Soft tissue thickening at umbilicus question related to prior surgery.  Significantly enlarged prostate gland 5.9 x 5.8 x 6.6 cm. Mild diffuse bladder wall thickening may be related to chronic outlet obstruction. Sigmoid and distal descending colonic diverticulosis without definite evidence of acute diverticulitis. Mild fat distention of inguinal canals with mild associated edema extending into the prevesical space question related to recent surgery. Small fluid collection identified along the right inguinal canal / spermatic cord, 11 x 10 mm, new, could represent a sterile or infected collection. No mass, adenopathy, free intraperitoneal fluid, or free air. No acute osseous findings.  IMPRESSION: Sigmoid diverticulosis without evidence of diverticulitis. Marked enlargement of prostate gland with question chronic bladder outlet obstruction. Suspected postsurgical changes at the inguinal canals bilaterally with persistent fat distention of the inguinal canals bilaterally. Tiny new fluid collection along the right spermatic cord which could be sterile or infected. Large bilateral renal  cysts and left adrenal nodules.   Original Report Authenticated By: Ulyses Southward, M.D.     Medications / Allergies: per chart  Antibiotics: Anti-infectives   None      Assessment  [name]  79 y.o. male       Problem List:  Active Problems:   * No active hospital problems. *   Persistent postprandial crampy abdominal pain with loose stools.  No evidence of bowel obstruction.  No evidence of biliary etiology.  Possible enteritis given the thickened small bowel loops on last week's CT scan.  Plan:  Complete workup.  Challenge and his situation is his mild dementia makes him a poor historian.  It is hard to tease out what is going on by history.  IV fluid  resuscitation  Recurrent anemia mild but concerning.  Had iron.  That can help constipate him as well.  Consider stool studies.  Defer to GI /Int Med evaluation/recs.  Consider bowel regimen with probiotics and fiber to help thicken up his stools and calm things down.  Increase PPIs to twice a day.  Consider gastric emptying study to see if that is contributing to problems.  Consider either scan with gallbladder ejection fraction to rule out biliary dyskinesia that is another possibility etiology.  Mesenteric ischemia could be another possible etiology but did not look strongly suggestive on the last few CT scans.  In the absence of documented weight loss or severe food fear, I do not know if I would rush to diagnose this yet.  -VTE prophylaxis- SCDs, etc  -mobilize as tolerated to help recovery  At this point, I do not see any surgical etiology for his problems.  His hernias is healed up and look fine.  The patient is stable.  There is no evidence of peritonitis, acute abdomen, nor shock.  There is no strong evidence of failure of improvement nor decline with current non-operative management.  There is no need for surgery at the present moment.  I will sign off.     Ardeth Sportsman, M.D., F.A.C.S. Gastrointestinal and Minimally Invasive Surgery [facility] [address], [address] [place], [place] [address] [phone] Main / Paging   05/31/2012

## 2012-05-31 NOTE — ED Notes (Signed)
Patient has urinal and is attempting to urinate at this time.

## 2012-05-31 NOTE — ED Notes (Signed)
Patient returned from X-ray 

## 2012-05-31 NOTE — ED Notes (Signed)
WUJ:[id number]> Expected date:05/31/12<BR> Expected time: 3:56 PM<BR> Means of arrival:Ambulance<BR> Comments:<BR> abd pain 3 yrs, worse today

## 2012-05-31 NOTE — ED Provider Notes (Signed)
History     CSN: [id number]  Arrival date & time 05/31/12  1558   First MD Initiated Contact with Patient 05/31/12 1607      Chief Complaint  Patient presents with  . Abdominal Pain    (Consider location/radiation/quality/duration/timing/severity/associated sxs/prior treatment) HPI Pt with chronic R inguinal pain since hernia repair 2 months ago. Pt has been worked up thoroughly after multiple visits to the ED. I saw the pt earlier this month. He has had not change in his symptoms. Continues to have mild nausea and intermittent constipation. Pt has been taken off PO narcotics and states that Tylenol is not improving pain. No fever, chills, vomiting. No blood in stool.  Past Medical History  Diagnosis Date  . GERD (gastroesophageal reflux disease)   . Hypothyroidism   . Chronic back pain   . Stroke   . Gout     no problems in 2 years  . Pulmonary embolism, bilateral 2010  . Hyperlipemia   . Myocardial infarction 1987; 1989  . Anginal pain   . DVT, lower extremity 1980's    LLE  . Asthma     "touch"  . Pneumonia 1970's  . Renal disorder ~ 03/2011    "wasn't functioning right"  . Arthritis     "mostly in my knees and shoulders"  . Stroke     "just a little one"  . Collapsed lung     "for 14 years" right   . Diabetes mellitus   . Hypertension   . Depression   . Anxiety   . Hearing loss   . Glaucoma     Past Surgical History  Procedure Laterality Date  . Esophagogastroduodenoscopy  12/22/2010    Procedure: ESOPHAGOGASTRODUODENOSCOPY (EGD);  Surgeon: Theda Belfast;  Location: [facility];  Service: Endoscopy;  Laterality: N/A;  . Appendectomy  1950  . Cataract extraction w/ intraocular lens  implant, bilateral Bilateral 1990's  . Cardiac catheterization    . Coronary angioplasty with stent placement      "think I have 4 total"  . Inguinal hernia repair Bilateral 03/28/2012    Procedure: LAPAROSCOPIC BILATERAL INGUINAL HERNIA REPAIR;  Surgeon: Ardeth Sportsman, MD;   Location: [facility];  Service: General;  Laterality: Bilateral;  . Insertion of mesh Bilateral 03/28/2012    Procedure: INSERTION OF MESH;  Surgeon: Ardeth Sportsman, MD;  Location: [facility];  Service: General;  Laterality: Bilateral;  . Hernia repair    . Esophagogastroduodenoscopy N/A 04/30/2012    Procedure: ESOPHAGOGASTRODUODENOSCOPY (EGD);  Surgeon: Theda Belfast, MD;  Location: [facility] ENDOSCOPY;  Service: Endoscopy;  Laterality: N/A;  . Colonoscopy N/A 05/01/2012    Procedure: COLONOSCOPY;  Surgeon: Theda Belfast, MD;  Location: [facility] ENDOSCOPY;  Service: Endoscopy;  Laterality: N/A;    Family History  Problem Relation Age of Onset  . Diabetes Mother   . Heart disease Father   . Lung cancer Brother   . Stroke Neg Hx   . Hypertension Neg Hx   . Hyperlipidemia Neg Hx   . Alcohol abuse Neg Hx     History  Substance Use Topics  . Smoking status: Former Smoker -- 2.00 packs/day for 3 years    Types: Cigarettes    Start date: 01/27/1946    Quit date: 01/22/1981  . Smokeless tobacco: Never Used  . Alcohol Use: No     Comment: 07/12/2011; "last drink of alcohol was ~ 1983"      Review of Systems  Constitutional: Negative for  fever and chills.  Respiratory: Negative for shortness of breath.   Cardiovascular: Negative for chest pain.  Gastrointestinal: Positive for nausea and abdominal pain. Negative for vomiting, diarrhea, constipation and blood in stool.  Genitourinary: Negative for dysuria.  Musculoskeletal: Negative for myalgias and back pain.  Skin: Negative for rash and wound.  Neurological: Negative for dizziness, weakness, light-headedness, numbness and headaches.  All other systems reviewed and are negative.    Allergies  Review of patient's allergies indicates no known allergies.  Home Medications   Current Outpatient Rx  Name  Route  Sig  Dispense  Refill  . acetaminophen (TYLENOL) 500 MG tablet   Oral   Take 1,000 mg by mouth every 6 (six) hours as needed for pain.           Marland Kitchen albuterol (PROVENTIL HFA;VENTOLIN HFA) 108 (90 BASE) MCG/ACT inhaler   Inhalation   Inhale 2 puffs into the lungs every 6 (six) hours as needed for shortness of breath.          . ALPRAZolam (XANAX) 0.25 MG tablet   Oral   Take 0.25 mg by mouth at bedtime as needed for sleep.         Marland Kitchen amiodarone (PACERONE) 200 MG tablet   Oral   Take 200 mg by mouth daily.          Marland Kitchen aspirin EC 81 MG tablet   Oral   Take 81 mg by mouth daily.          Marland Kitchen dexlansoprazole (DEXILANT) 60 MG capsule   Oral   Take 60 mg by mouth daily.          . furosemide (LASIX) 40 MG tablet   Oral   Take 40 mg by mouth daily.          Marland Kitchen glimepiride (AMARYL) 1 MG tablet   Oral   Take 1 mg by mouth daily before breakfast.          . isosorbide mononitrate (IMDUR) 60 MG 24 hr tablet   Oral   Take 60 mg by mouth every morning.          Marland Kitchen levothyroxine (SYNTHROID, LEVOTHROID) 50 MCG tablet   Oral   Take 50 mcg by mouth daily before breakfast.          . lubiprostone (AMITIZA) 24 MCG capsule   Oral   Take 24 mcg by mouth 2 (two) times daily with a meal.         . nitroGLYCERIN (NITROSTAT) 0.4 MG SL tablet   Sublingual   Place 0.4 mg under the tongue every 5 (five) minutes as needed for chest pain.         . polyethylene glycol (MIRALAX / GLYCOLAX) packet   Oral   Take 17 g by mouth 2 (two) times daily as needed (for constipation).         . polyvinyl alcohol (LIQUIFILM TEARS) 1.4 % ophthalmic solution   Both Eyes   Place 1-2 drops into both eyes as needed (for dry eyes).          . ranitidine (ZANTAC) 300 MG capsule   Oral   Take 300 mg by mouth every evening.         . rosuvastatin (CRESTOR) 10 MG tablet   Oral   Take 10 mg by mouth 2 (two) times daily.         . Tamsulosin HCl (FLOMAX) 0.4 MG CAPS   Oral   Take  0.8 mg by mouth at bedtime.          Marland Kitchen HYDROcodone-acetaminophen (NORCO) 5-325 MG per tablet   Oral   Take 1 tablet by mouth every 4 (four)  hours as needed for pain.   10 tablet   0   . ondansetron (ZOFRAN ODT) 4 MG disintegrating tablet      4mg  ODT q4 hours prn nausea/vomit   8 tablet   0     BP 153/60  Pulse 73  Temp(Src) 98.5 F (36.9 C) (Oral)  Resp 15  SpO2 99%  Physical Exam  Nursing note and vitals reviewed. Constitutional: He is oriented to person, place, and time. He appears well-developed and well-nourished. No distress.  HENT:  Head: Normocephalic and atraumatic.  Mouth/Throat: Oropharynx is clear and moist.  Eyes: EOM are normal. Pupils are equal, round, and reactive to light.  Neck: Normal range of motion. Neck supple.  Cardiovascular: Normal rate and regular rhythm.   Pulmonary/Chest: Effort normal and breath sounds normal. No respiratory distress. He has no wheezes. He has no rales.  Abdominal: Soft. Bowel sounds are normal. He exhibits no distension and no mass. There is tenderness (Pt with RLQ/ R inguinal tenderness. No mass, no rebound or guarding at the site. No testicular pain. ). There is no rebound and no guarding.  Musculoskeletal: Normal range of motion. He exhibits no edema and no tenderness.  Neurological: He is alert and oriented to person, place, and time.  Skin: Skin is warm and dry. No rash noted. No erythema.  Psychiatric: He has a normal mood and affect. His behavior is normal.    ED Course  Procedures (including critical care time)  Labs Reviewed  CBC WITH DIFFERENTIAL - Abnormal; Notable for the following:    RBC 3.59 (*)    Hemoglobin 9.6 (*)    HCT 29.1 (*)    Monocytes Relative 14 (*)    All other components within normal limits  COMPREHENSIVE METABOLIC PANEL - Abnormal; Notable for the following:    Glucose, Bld 68 (*)    Creatinine, Ser 1.58 (*)    Albumin 3.0 (*)    Total Bilirubin 0.2 (*)    GFR calc non Af Amer 40 (*)    GFR calc Af Amer 46 (*)    All other components within normal limits  LIPASE, BLOOD  URINALYSIS, ROUTINE W REFLEX MICROSCOPIC   Dg Abd  Acute W/chest  05/31/2012  *RADIOLOGY REPORT*  Clinical Data: Low abdominal pain, diarrhea  ACUTE ABDOMEN SERIES (ABDOMEN 2 VIEW & CHEST 1 VIEW)  Comparison: CT 05/22/2012  Findings: Curvilinear bilateral lower lobe scarring or atelectasis noted.  Moderate enlargement of the cardiomediastinal silhouette is present.  Subcarinal fullness most likely reflects left atrial enlargement.  No free air beneath the diaphragms. Aorta is ectatic and unfolded.  Mild rightward curvature of the lumbar spine is noted with degenerative changes at L3-L4.  No dilated gas filled loop of bowel.  No air fluid level.  No abnormal calcific opacity.  No acute osseous abnormality.  IMPRESSION: Nonobstructive bowel gas pattern.  Bibasilar atelectasis or scarring.   Original Report Authenticated By: Christiana Pellant, M.D.      1. Chronic abdominal pain       MDM   Dr Michaell Cowing saw in ED. Pt symptoms controlled. No new findings. Advised to follow up and given return precautions.        Loren Racer, MD 05/31/12 1758

## 2012-05-31 NOTE — ED Notes (Signed)
Pt's son Graciella Belton called to take pt back to his residence. Pt's son stated that he will be here in a few minutes to pick up his father.

## 2012-05-31 NOTE — ED Notes (Addendum)
Patient from assisted living facility c/o right lower abdominal pain that he has had for three yeas.  Today it worsened, and he has developed dry heaves which EMS gave him zofran. IV.  Patient reports constant diarrhea.  Denies fever, chills recently. CBG-90

## 2012-06-02 DIAGNOSIS — R109 Unspecified abdominal pain: Secondary | ICD-10-CM | POA: Insufficient documentation

## 2012-06-03 ENCOUNTER — Telehealth: Payer: Self-pay

## 2012-06-03 DIAGNOSIS — E1129 Type 2 diabetes mellitus with other diabetic kidney complication: Secondary | ICD-10-CM

## 2012-06-03 DIAGNOSIS — I10 Essential (primary) hypertension: Secondary | ICD-10-CM

## 2012-06-03 MED ORDER — LOSARTAN POTASSIUM 100 MG PO TABS: 100.0000 mg | ORAL_TABLET | Freq: Every day | ORAL | Status: DC

## 2012-06-03 NOTE — Telephone Encounter (Signed)
Received approval today from [employer] approving Edarbi through May 28, 2013, however per EPIC, medication was switched to losartan.

## 2012-06-03 NOTE — Telephone Encounter (Signed)
Call Documentation    Etta Grandchild, MD at 06/03/2012 11:40 AM    Status: Signed             Changed to losartan        Anselm Jungling, CMA at 06/03/2012 11:32 AM    Status: Signed             Pharmacy called stating that although PA approval was obtained for Edarbi, it is very expensive - $80+/month. Pharmacy is requesting medication be changed to cheaper alternative. Please advise, per EPIC medication was discontinued and there is no documentation of a PA.

## 2012-06-03 NOTE — Telephone Encounter (Signed)
Changed to losartan

## 2012-06-03 NOTE — Telephone Encounter (Signed)
Pharmacy called stating that although PA approval was obtained for Edarbi, it is very expensive - $80+/month. Pharmacy is requesting medication be changed to cheaper alternative. Please advise, per EPIC medication was discontinued and there is no documentation of a PA.

## 2012-06-06 ENCOUNTER — Emergency Department (HOSPITAL_COMMUNITY): Payer: Medicare Other

## 2012-06-06 ENCOUNTER — Emergency Department (HOSPITAL_COMMUNITY)
Admission: EM | Admit: 2012-06-06 | Discharge: 2012-06-07 | Disposition: A | Discharge status: Home / Self Care | Payer: Medicare Other | Attending: Emergency Medicine | Admitting: Emergency Medicine

## 2012-06-06 DIAGNOSIS — H9190 Unspecified hearing loss, unspecified ear: Secondary | ICD-10-CM | POA: Insufficient documentation

## 2012-06-06 DIAGNOSIS — Z87891 Personal history of nicotine dependence: Secondary | ICD-10-CM | POA: Insufficient documentation

## 2012-06-06 DIAGNOSIS — M549 Dorsalgia, unspecified: Secondary | ICD-10-CM | POA: Insufficient documentation

## 2012-06-06 DIAGNOSIS — F3289 Other specified depressive episodes: Secondary | ICD-10-CM | POA: Insufficient documentation

## 2012-06-06 DIAGNOSIS — Z87448 Personal history of other diseases of urinary system: Secondary | ICD-10-CM | POA: Insufficient documentation

## 2012-06-06 DIAGNOSIS — Z86718 Personal history of other venous thrombosis and embolism: Secondary | ICD-10-CM | POA: Insufficient documentation

## 2012-06-06 DIAGNOSIS — G8929 Other chronic pain: Secondary | ICD-10-CM | POA: Insufficient documentation

## 2012-06-06 DIAGNOSIS — K219 Gastro-esophageal reflux disease without esophagitis: Secondary | ICD-10-CM | POA: Insufficient documentation

## 2012-06-06 DIAGNOSIS — Z86711 Personal history of pulmonary embolism: Secondary | ICD-10-CM | POA: Insufficient documentation

## 2012-06-06 DIAGNOSIS — N289 Disorder of kidney and ureter, unspecified: Secondary | ICD-10-CM

## 2012-06-06 DIAGNOSIS — Z7982 Long term (current) use of aspirin: Secondary | ICD-10-CM | POA: Insufficient documentation

## 2012-06-06 DIAGNOSIS — R109 Unspecified abdominal pain: Secondary | ICD-10-CM

## 2012-06-06 DIAGNOSIS — Z8673 Personal history of transient ischemic attack (TIA), and cerebral infarction without residual deficits: Secondary | ICD-10-CM | POA: Insufficient documentation

## 2012-06-06 DIAGNOSIS — Z79899 Other long term (current) drug therapy: Secondary | ICD-10-CM | POA: Insufficient documentation

## 2012-06-06 DIAGNOSIS — J45909 Unspecified asthma, uncomplicated: Secondary | ICD-10-CM | POA: Insufficient documentation

## 2012-06-06 DIAGNOSIS — E119 Type 2 diabetes mellitus without complications: Secondary | ICD-10-CM | POA: Insufficient documentation

## 2012-06-06 DIAGNOSIS — Z8739 Personal history of other diseases of the musculoskeletal system and connective tissue: Secondary | ICD-10-CM | POA: Insufficient documentation

## 2012-06-06 DIAGNOSIS — I252 Old myocardial infarction: Secondary | ICD-10-CM | POA: Insufficient documentation

## 2012-06-06 DIAGNOSIS — I10 Essential (primary) hypertension: Secondary | ICD-10-CM | POA: Insufficient documentation

## 2012-06-06 DIAGNOSIS — E039 Hypothyroidism, unspecified: Secondary | ICD-10-CM | POA: Insufficient documentation

## 2012-06-06 DIAGNOSIS — Z8701 Personal history of pneumonia (recurrent): Secondary | ICD-10-CM | POA: Insufficient documentation

## 2012-06-06 DIAGNOSIS — F329 Major depressive disorder, single episode, unspecified: Secondary | ICD-10-CM | POA: Insufficient documentation

## 2012-06-06 DIAGNOSIS — R1084 Generalized abdominal pain: Secondary | ICD-10-CM | POA: Insufficient documentation

## 2012-06-06 DIAGNOSIS — F411 Generalized anxiety disorder: Secondary | ICD-10-CM | POA: Insufficient documentation

## 2012-06-06 DIAGNOSIS — E785 Hyperlipidemia, unspecified: Secondary | ICD-10-CM | POA: Insufficient documentation

## 2012-06-06 LAB — COMPREHENSIVE METABOLIC PANEL
ALT: 19 U/L (ref 0–53)
AST: 24 U/L (ref 0–37)
CO2: 26 mEq/L (ref 19–32)
Calcium: 9 mg/dL (ref 8.4–10.5)
Creatinine, Ser: 2.16 mg/dL — ABNORMAL HIGH (ref 0.50–1.35)
GFR calc Af Amer: 31 mL/min — ABNORMAL LOW (ref >90)
GFR calc non Af Amer: 27 mL/min — ABNORMAL LOW (ref >90)
Sodium: 143 mEq/L (ref 135–145)
Total Protein: 6.6 g/dL (ref 6.0–8.3)

## 2012-06-06 LAB — URINALYSIS, ROUTINE W REFLEX MICROSCOPIC
Ketones, ur: NEGATIVE mg/dL
Leukocytes, UA: NEGATIVE
Nitrite: NEGATIVE
Protein, ur: NEGATIVE mg/dL
pH: 6 (ref 5.0–8.0)

## 2012-06-06 LAB — CBC WITH DIFFERENTIAL/PLATELET
Basophils Absolute: 0 10*3/uL (ref 0.0–0.1)
Basophils Relative: 0 % (ref 0–1)
Eosinophils Relative: 1 % (ref 0–5)
HCT: 28.8 % — ABNORMAL LOW (ref 39.0–52.0)
MCH: 27.4 pg (ref 26.0–34.0)
MCHC: 34.7 g/dL (ref 30.0–36.0)
MCV: 78.9 fL (ref 78.0–100.0)
Monocytes Absolute: 0.9 10*3/uL (ref 0.1–1.0)
RDW: 14.8 % (ref 11.5–15.5)

## 2012-06-06 LAB — TROPONIN I: Troponin I: <0.3 ng/mL (ref <0.30)

## 2012-06-06 LAB — LACTIC ACID, PLASMA: Lactic Acid, Venous: 1.3 mmol/L (ref 0.5–2.2)

## 2012-06-06 MED ORDER — POLYETHYLENE GLYCOL 3350 17 G PO PACK: 17.0000 g | PACK | Freq: Every day | ORAL | Status: DC

## 2012-06-06 MED ORDER — SODIUM CHLORIDE 0.9 % IV BOLUS (SEPSIS)
1000.0000 mL | Freq: Once | INTRAVENOUS | Status: AC
  Administered 2012-06-07: 1000 mL via INTRAVENOUS

## 2012-06-06 MED ORDER — IOHEXOL 300 MG/ML  SOLN: 50.0000 mL | Freq: Once | INTRAMUSCULAR | Status: AC | PRN

## 2012-06-06 NOTE — ED Provider Notes (Signed)
History     CSN: [id number]  Arrival date & time 06/06/12  1904   First MD Initiated Contact with Patient 06/06/12 1910      Chief Complaint  Patient presents with  . Abdominal Pain    (Consider location/radiation/quality/duration/timing/severity/associated sxs/prior treatment) HPI Comments: Patient states he's been having diffuse lower abdominal pain for the past several days it is worse than usual. Having pain for the past 6 months with multiple recent ER visits addressing this pain. He had angle hernia repair in March by Dr. gross. Dr. gross saw him in the ER on May 10 and did not think he had any acute surgical issues. Patient endorses similar symptoms including nausea and small bowel movements this morning. Denies any fever, chills, urinary symptoms, back pain, shortness of breath.  The history is provided by the patient and the EMS personnel.    Past Medical History  Diagnosis Date  . GERD (gastroesophageal reflux disease)   . Hypothyroidism   . Chronic back pain   . Stroke   . Gout     no problems in 2 years  . Pulmonary embolism, bilateral 2010  . Hyperlipemia   . Myocardial infarction 1987; 1989  . Anginal pain   . DVT, lower extremity 1980's    LLE  . Asthma     "touch"  . Pneumonia 1970's  . Renal disorder ~ 03/2011    "wasn't functioning right"  . Arthritis     "mostly in my knees and shoulders"  . Stroke     "just a little one"  . Collapsed lung     "for 14 years" right   . Diabetes mellitus   . Hypertension   . Depression   . Anxiety   . Hearing loss   . Glaucoma     Past Surgical History  Procedure Laterality Date  . Esophagogastroduodenoscopy  12/22/2010    Procedure: ESOPHAGOGASTRODUODENOSCOPY (EGD);  Surgeon: Theda Belfast;  Location: [facility];  Service: Endoscopy;  Laterality: N/A;  . Appendectomy  1950  . Cataract extraction w/ intraocular lens  implant, bilateral Bilateral 1990's  . Cardiac catheterization    . Coronary angioplasty  with stent placement      "think I have 4 total"  . Inguinal hernia repair Bilateral 03/28/2012    Procedure: LAPAROSCOPIC BILATERAL INGUINAL HERNIA REPAIR;  Surgeon: Ardeth Sportsman, MD;  Location: [facility];  Service: General;  Laterality: Bilateral;  . Insertion of mesh Bilateral 03/28/2012    Procedure: INSERTION OF MESH;  Surgeon: Ardeth Sportsman, MD;  Location: [facility];  Service: General;  Laterality: Bilateral;  . Hernia repair    . Esophagogastroduodenoscopy N/A 04/30/2012    Procedure: ESOPHAGOGASTRODUODENOSCOPY (EGD);  Surgeon: Theda Belfast, MD;  Location: [facility] ENDOSCOPY;  Service: Endoscopy;  Laterality: N/A;  . Colonoscopy N/A 05/01/2012    Procedure: COLONOSCOPY;  Surgeon: Theda Belfast, MD;  Location: [facility] ENDOSCOPY;  Service: Endoscopy;  Laterality: N/A;    Family History  Problem Relation Age of Onset  . Diabetes Mother   . Heart disease Father   . Lung cancer Brother   . Stroke Neg Hx   . Hypertension Neg Hx   . Hyperlipidemia Neg Hx   . Alcohol abuse Neg Hx     History  Substance Use Topics  . Smoking status: Former Smoker -- 2.00 packs/day for 3 years    Types: Cigarettes    Start date: 01/27/1946    Quit date: 01/22/1981  . Smokeless tobacco:  Never Used  . Alcohol Use: No     Comment: 07/12/2011; "last drink of alcohol was ~ 1983"      Review of Systems  Constitutional: Negative for fever, activity change and appetite change.  Eyes: Negative for visual disturbance.  Respiratory: Negative for cough, chest tightness and shortness of breath.   Cardiovascular: Negative for chest pain.  Gastrointestinal: Positive for nausea, abdominal pain and diarrhea. Negative for vomiting.  Genitourinary: Negative for dysuria and hematuria.  Musculoskeletal: Negative for back pain.  Skin: Negative for rash.  Neurological: Negative for dizziness, weakness and headaches.  A complete 10 system review of systems was obtained and all systems are negative except as noted in the HPI and  PMH.    Allergies  Review of patient's allergies indicates no known allergies.  Home Medications   Current Outpatient Rx  Name  Route  Sig  Dispense  Refill  . acetaminophen (TYLENOL) 500 MG tablet   Oral   Take 1,000 mg by mouth every 6 (six) hours as needed for pain.          Marland Kitchen albuterol (PROVENTIL HFA;VENTOLIN HFA) 108 (90 BASE) MCG/ACT inhaler   Inhalation   Inhale 2 puffs into the lungs every 6 (six) hours as needed for shortness of breath.          . ALPRAZolam (XANAX) 0.25 MG tablet   Oral   Take 0.25 mg by mouth at bedtime as needed for sleep.         Marland Kitchen amiodarone (PACERONE) 200 MG tablet   Oral   Take 200 mg by mouth daily.          Marland Kitchen aspirin EC 81 MG tablet   Oral   Take 81 mg by mouth daily.          Marland Kitchen dexlansoprazole (DEXILANT) 60 MG capsule   Oral   Take 60 mg by mouth daily.          . furosemide (LASIX) 40 MG tablet   Oral   Take 40 mg by mouth at bedtime.          Marland Kitchen glimepiride (AMARYL) 1 MG tablet   Oral   Take 1 mg by mouth daily before breakfast.          . HYDROcodone-acetaminophen (NORCO) 5-325 MG per tablet   Oral   Take 1 tablet by mouth every 4 (four) hours as needed for pain.   10 tablet   0   . isosorbide mononitrate (IMDUR) 60 MG 24 hr tablet   Oral   Take 60 mg by mouth every morning.          Marland Kitchen ketotifen (ZADITOR) 0.025 % ophthalmic solution   Right Eye   Place 1 drop into the right eye 2 (two) times daily.         Marland Kitchen levothyroxine (SYNTHROID, LEVOTHROID) 50 MCG tablet   Oral   Take 50 mcg by mouth daily before breakfast.          . losartan (COZAAR) 100 MG tablet   Oral   Take 1 tablet (100 mg total) by mouth daily.   90 tablet   3   . lubiprostone (AMITIZA) 24 MCG capsule   Oral   Take 24 mcg by mouth 2 (two) times daily with a meal.         . nitroGLYCERIN (NITROSTAT) 0.4 MG SL tablet   Sublingual   Place 0.4 mg under the tongue every 5 (five) minutes as  needed for chest pain.         .  polyethylene glycol (MIRALAX / GLYCOLAX) packet   Oral   Take 17 g by mouth 2 (two) times daily as needed (for constipation).         . polyvinyl alcohol (LIQUIFILM TEARS) 1.4 % ophthalmic solution   Both Eyes   Place 1-2 drops into both eyes as needed (for dry eyes).          . ranitidine (ZANTAC) 300 MG capsule   Oral   Take 300 mg by mouth every evening.         . rosuvastatin (CRESTOR) 10 MG tablet   Oral   Take 10 mg by mouth daily.          . Tamsulosin HCl (FLOMAX) 0.4 MG CAPS   Oral   Take 0.8 mg by mouth at bedtime.          . polyethylene glycol (MIRALAX) packet   Oral   Take 17 g by mouth daily.   14 each   0     BP 196/75  Pulse 63  Temp(Src) 98.3 F (36.8 C) (Oral)  Resp 16  SpO2 100%  Physical Exam  Constitutional: He is oriented to person, place, and time. He appears well-developed and well-nourished. No distress.  HENT:  Head: Normocephalic and atraumatic.  Mouth/Throat: Oropharynx is clear and moist. No oropharyngeal exudate.  Eyes: Conjunctivae and EOM are normal. Pupils are equal, round, and reactive to light.  Neck: Normal range of motion. Neck supple.  Cardiovascular: Normal rate, regular rhythm and normal heart sounds.   No murmur heard. Pulmonary/Chest: Effort normal and breath sounds normal. No respiratory distress.  Abdominal: Soft. There is tenderness. There is no rebound and no guarding.  Well healed surgical incisions. Abdomen soft. Mild right lower quadrant and left lower quadrant tenderness without peritoneal signs  Genitourinary:  No testicular tenderness  Musculoskeletal: Normal range of motion. He exhibits no edema and no tenderness.  Neurological: He is alert and oriented to person, place, and time. No cranial nerve deficit. He exhibits normal muscle tone. Coordination normal.  Skin: Skin is warm.    ED Course  Procedures (including critical care time)  Labs Reviewed  CBC WITH DIFFERENTIAL - Abnormal; Notable for  the following:    RBC 3.65 (*)    Hemoglobin 10.0 (*)    HCT 28.8 (*)    Monocytes Relative 13 (*)    All other components within normal limits  COMPREHENSIVE METABOLIC PANEL - Abnormal; Notable for the following:    Glucose, Bld 107 (*)    Creatinine, Ser 2.16 (*)    Albumin 3.2 (*)    GFR calc non Af Amer 27 (*)    GFR calc Af Amer 31 (*)    All other components within normal limits  LIPASE, BLOOD  LACTIC ACID, PLASMA  URINALYSIS, ROUTINE W REFLEX MICROSCOPIC  TROPONIN I  TROPONIN I   Ct Abdomen Pelvis Wo Contrast  06/06/2012   *RADIOLOGY REPORT*  Clinical Data: Abdominal pain.  Constipation.  CT ABDOMEN AND PELVIS WITHOUT CONTRAST  Technique:  Multidetector CT imaging of the abdomen and pelvis was performed following the standard protocol without intravenous contrast.  Comparison: 06/06/2012 radiographs; CT from 05/22/2012  Findings: Dependent subsegmental atelectasis.  Scarring observed in the right middle lobe.  Coronary atherosclerosis observed.  Hyperdense liver parenchyma, 84 HU, suspicious for hemochromatosis.  Stable hypodense lesion in segment three of the liver.  Stable hypodense small  left adrenal masses, probable adenomas. Stable renal cysts.  Aortoiliac atherosclerotic calcification.  Sigmoid diverticulosis noted.  Inguinal stranding from prior hernia repairs.  Orally administered contrast extends through to the colon.  Mild prominence of stool in the colon may reflect constipation.  Prominent prostate gland, stable. Stable lumbar spondylosis and scoliosis.  No additional significant findings.  IMPRESSION:  1.  Hyperdense liver parenchyma, suspicious for hemochromatosis. 2.  Suspected mild constipation. 3.  Otherwise stable.   Original Report Authenticated By: Gaylyn Rong, M.D.   Dg Abd Acute W/chest  06/06/2012   *RADIOLOGY REPORT*  Clinical Data: Stomach pain, nausea, diarrhea  ACUTE ABDOMEN SERIES (ABDOMEN 2 VIEW & CHEST 1 VIEW)  Comparison: 05/31/2012  Findings: Mild  cardiomegaly is stable.  Coronary artery atherosclerotic calcification versus stent material projects over the left heart.  Suggest correlation with cardiac history. Pulmonary vascularity is normal.  The lungs are clear.  No free intraperitoneal air.  The bowel gas pattern is nonobstructive.  There is a moderate amount of stool in the colon.  No significant stool is seen in the rectum.  No abdominal mass affect or radiopaque urinary tract calculus is identified.  Bilateral pelvic phleboliths appear stable.  There is a stable convex right scoliosis of the lumbar spine with degenerative disc disease most prominent at L3-L4 on the left.  IMPRESSION:  1.  Cardiomegaly without acute cardiopulmonary disease. 2.  Nonobstructive bowel gas pattern.  Moderate amount of stool in the colon. 3.  Coronary artery calcifications versus stent material visualized.   Original Report Authenticated By: Britta Mccreedy, M.D.     1. Abdominal pain   2. Renal insufficiency       MDM  Ongoing abdominal pain for the past several months with multiple previous ED visits and extensive workup.  Labs appear to be at baseline. There is slight worsening of his creatinine. Cr 2 from 1.6. Hemoglobin is at baseline.  CT scan shows no acute pathology. Mild constipation. MiraLAX as listed on patient's medication list but he is not sure if he is taking it.  Advised to restart miralax. Advised to followup with Dr. Yetta Barre on Monday for recheck of Cr.  UA neg.    Date: 06/06/2012  Rate: 65  Rhythm: normal sinus rhythm  QRS Axis: normal  Intervals: normal  ST/T Wave abnormalities: normal  Conduction Disutrbances:left bundle branch block  Narrative Interpretation:   Old EKG Reviewed: unchanged    Glynn Octave, MD 06/07/12 [phone]

## 2012-06-06 NOTE — ED Notes (Signed)
Pt has had ongoing adb pain and constipation x 6-7 months. States his pain is worse than normal last BM "a little this am"

## 2012-06-13 ENCOUNTER — Encounter (HOSPITAL_COMMUNITY): Payer: Self-pay | Admitting: Adult Health

## 2012-06-13 ENCOUNTER — Emergency Department (HOSPITAL_COMMUNITY): Payer: Medicare Other

## 2012-06-13 ENCOUNTER — Inpatient Hospital Stay (HOSPITAL_COMMUNITY)
Admission: EM | Admit: 2012-06-13 | Discharge: 2012-06-25 | DRG: 175 | Disposition: A | Discharge status: Home / Self Care | Payer: Medicare Other | Attending: Cardiology | Admitting: Cardiology

## 2012-06-13 ENCOUNTER — Observation Stay (HOSPITAL_COMMUNITY): Payer: Medicare Other

## 2012-06-13 ENCOUNTER — Other Ambulatory Visit: Payer: Self-pay

## 2012-06-13 DIAGNOSIS — Z87891 Personal history of nicotine dependence: Secondary | ICD-10-CM

## 2012-06-13 DIAGNOSIS — K219 Gastro-esophageal reflux disease without esophagitis: Secondary | ICD-10-CM | POA: Diagnosis present

## 2012-06-13 DIAGNOSIS — M109 Gout, unspecified: Secondary | ICD-10-CM | POA: Diagnosis present

## 2012-06-13 DIAGNOSIS — I4891 Unspecified atrial fibrillation: Secondary | ICD-10-CM | POA: Diagnosis present

## 2012-06-13 DIAGNOSIS — M549 Dorsalgia, unspecified: Secondary | ICD-10-CM | POA: Diagnosis present

## 2012-06-13 DIAGNOSIS — I2699 Other pulmonary embolism without acute cor pulmonale: Principal | ICD-10-CM | POA: Diagnosis present

## 2012-06-13 DIAGNOSIS — E039 Hypothyroidism, unspecified: Secondary | ICD-10-CM | POA: Diagnosis present

## 2012-06-13 DIAGNOSIS — F411 Generalized anxiety disorder: Secondary | ICD-10-CM | POA: Diagnosis present

## 2012-06-13 DIAGNOSIS — I509 Heart failure, unspecified: Secondary | ICD-10-CM | POA: Diagnosis present

## 2012-06-13 DIAGNOSIS — I129 Hypertensive chronic kidney disease with stage 1 through stage 4 chronic kidney disease, or unspecified chronic kidney disease: Secondary | ICD-10-CM | POA: Diagnosis present

## 2012-06-13 DIAGNOSIS — E785 Hyperlipidemia, unspecified: Secondary | ICD-10-CM | POA: Diagnosis present

## 2012-06-13 DIAGNOSIS — E119 Type 2 diabetes mellitus without complications: Secondary | ICD-10-CM | POA: Diagnosis present

## 2012-06-13 DIAGNOSIS — I5020 Unspecified systolic (congestive) heart failure: Secondary | ICD-10-CM | POA: Diagnosis present

## 2012-06-13 DIAGNOSIS — I2589 Other forms of chronic ischemic heart disease: Secondary | ICD-10-CM | POA: Diagnosis present

## 2012-06-13 DIAGNOSIS — H409 Unspecified glaucoma: Secondary | ICD-10-CM | POA: Diagnosis present

## 2012-06-13 DIAGNOSIS — K5900 Constipation, unspecified: Secondary | ICD-10-CM | POA: Diagnosis present

## 2012-06-13 DIAGNOSIS — Z86711 Personal history of pulmonary embolism: Secondary | ICD-10-CM

## 2012-06-13 DIAGNOSIS — R079 Chest pain, unspecified: Secondary | ICD-10-CM

## 2012-06-13 DIAGNOSIS — F3289 Other specified depressive episodes: Secondary | ICD-10-CM | POA: Diagnosis present

## 2012-06-13 DIAGNOSIS — Z8673 Personal history of transient ischemic attack (TIA), and cerebral infarction without residual deficits: Secondary | ICD-10-CM

## 2012-06-13 DIAGNOSIS — G8929 Other chronic pain: Secondary | ICD-10-CM | POA: Diagnosis present

## 2012-06-13 DIAGNOSIS — F329 Major depressive disorder, single episode, unspecified: Secondary | ICD-10-CM | POA: Diagnosis present

## 2012-06-13 DIAGNOSIS — D638 Anemia in other chronic diseases classified elsewhere: Secondary | ICD-10-CM | POA: Diagnosis present

## 2012-06-13 DIAGNOSIS — J189 Pneumonia, unspecified organism: Secondary | ICD-10-CM | POA: Diagnosis present

## 2012-06-13 DIAGNOSIS — J45909 Unspecified asthma, uncomplicated: Secondary | ICD-10-CM | POA: Diagnosis present

## 2012-06-13 DIAGNOSIS — Z86718 Personal history of other venous thrombosis and embolism: Secondary | ICD-10-CM

## 2012-06-13 DIAGNOSIS — Z9861 Coronary angioplasty status: Secondary | ICD-10-CM

## 2012-06-13 DIAGNOSIS — I252 Old myocardial infarction: Secondary | ICD-10-CM

## 2012-06-13 DIAGNOSIS — H9190 Unspecified hearing loss, unspecified ear: Secondary | ICD-10-CM | POA: Diagnosis present

## 2012-06-13 DIAGNOSIS — N183 Chronic kidney disease, stage 3 unspecified: Secondary | ICD-10-CM | POA: Diagnosis present

## 2012-06-13 DIAGNOSIS — I2510 Atherosclerotic heart disease of native coronary artery without angina pectoris: Secondary | ICD-10-CM | POA: Diagnosis present

## 2012-06-13 DIAGNOSIS — R109 Unspecified abdominal pain: Secondary | ICD-10-CM | POA: Diagnosis present

## 2012-06-13 HISTORY — DX: Other complications of anesthesia, initial encounter: T88.59XA

## 2012-06-13 HISTORY — DX: Adverse effect of unspecified anesthetic, initial encounter: T41.45XA

## 2012-06-13 HISTORY — DX: Other specified postprocedural states: R11.2

## 2012-06-13 HISTORY — DX: Other specified postprocedural states: Z98.890

## 2012-06-13 LAB — CBC WITH DIFFERENTIAL/PLATELET
Basophils Absolute: 0 10*3/uL (ref 0.0–0.1)
Basophils Relative: 0 % (ref 0–1)
Eosinophils Absolute: 0 10*3/uL (ref 0.0–0.7)
Eosinophils Absolute: 0.1 10*3/uL (ref 0.0–0.7)
Eosinophils Relative: 0 % (ref 0–5)
Eosinophils Relative: 1 % (ref 0–5)
HCT: 29 % — ABNORMAL LOW (ref 39.0–52.0)
Lymphs Abs: 1.6 10*3/uL (ref 0.7–4.0)
MCH: 27.2 pg (ref 26.0–34.0)
MCH: 27.3 pg (ref 26.0–34.0)
MCV: 78.4 fL (ref 78.0–100.0)
MCV: 79.2 fL (ref 78.0–100.0)
Monocytes Absolute: 1.6 10*3/uL — ABNORMAL HIGH (ref 0.1–1.0)
Monocytes Relative: 16 % — ABNORMAL HIGH (ref 3–12)
Neutrophils Relative %: 65 % (ref 43–77)
Platelets: 183 10*3/uL (ref 150–400)
Platelets: 172 10*3/uL (ref 150–400)
RBC: 3.66 MIL/uL — ABNORMAL LOW (ref 4.22–5.81)
RDW: 14.8 % (ref 11.5–15.5)

## 2012-06-13 LAB — CBC
HCT: 29 % — ABNORMAL LOW (ref 39.0–52.0)
Hemoglobin: 9.9 g/dL — ABNORMAL LOW (ref 13.0–17.0)
MCH: 26.8 pg (ref 26.0–34.0)
MCHC: 34.1 g/dL (ref 30.0–36.0)

## 2012-06-13 LAB — URINALYSIS, ROUTINE W REFLEX MICROSCOPIC
Glucose, UA: NEGATIVE mg/dL
Leukocytes, UA: NEGATIVE
Protein, ur: NEGATIVE mg/dL
Specific Gravity, Urine: 1.015 (ref 1.005–1.030)

## 2012-06-13 LAB — COMPREHENSIVE METABOLIC PANEL
ALT: 17 U/L (ref 0–53)
AST: 22 U/L (ref 0–37)
Albumin: 3.3 g/dL — ABNORMAL LOW (ref 3.5–5.2)
BUN: 14 mg/dL (ref 6–23)
CO2: 24 mEq/L (ref 19–32)
Calcium: 9 mg/dL (ref 8.4–10.5)
Calcium: 8.9 mg/dL (ref 8.4–10.5)
Chloride: 105 mEq/L (ref 96–112)
Creatinine, Ser: 1.66 mg/dL — ABNORMAL HIGH (ref 0.50–1.35)
GFR calc Af Amer: 38 mL/min — ABNORMAL LOW (ref >90)
GFR calc Af Amer: 43 mL/min — ABNORMAL LOW (ref >90)
GFR calc non Af Amer: 37 mL/min — ABNORMAL LOW (ref >90)
Glucose, Bld: 142 mg/dL — ABNORMAL HIGH (ref 70–99)
Glucose, Bld: 117 mg/dL — ABNORMAL HIGH (ref 70–99)
Sodium: 140 mEq/L (ref 135–145)
Total Bilirubin: 0.4 mg/dL (ref 0.3–1.2)
Total Protein: 6.9 g/dL (ref 6.0–8.3)

## 2012-06-13 LAB — GLUCOSE, CAPILLARY: Glucose-Capillary: 123 mg/dL — ABNORMAL HIGH (ref 70–99)

## 2012-06-13 LAB — HEPARIN LEVEL (UNFRACTIONATED): Heparin Unfractionated: 0.75 IU/mL — ABNORMAL HIGH (ref 0.30–0.70)

## 2012-06-13 LAB — POCT I-STAT TROPONIN I

## 2012-06-13 LAB — TSH: TSH: 0.554 u[IU]/mL (ref 0.350–4.500)

## 2012-06-13 LAB — PRO B NATRIURETIC PEPTIDE
Pro B Natriuretic peptide (BNP): 673.7 pg/mL — ABNORMAL HIGH (ref 0–450)
Pro B Natriuretic peptide (BNP): 583.2 pg/mL — ABNORMAL HIGH (ref 0–450)

## 2012-06-13 LAB — PROTIME-INR: Prothrombin Time: 15.7 seconds — ABNORMAL HIGH (ref 11.6–15.2)

## 2012-06-13 LAB — TROPONIN I: Troponin I: <0.3 ng/mL (ref <0.30)

## 2012-06-13 LAB — MAGNESIUM: Magnesium: 2 mg/dL (ref 1.5–2.5)

## 2012-06-13 LAB — OCCULT BLOOD, POC DEVICE: Fecal Occult Bld: NEGATIVE

## 2012-06-13 MED ORDER — ASPIRIN 81 MG PO CHEW: 324.0000 mg | CHEWABLE_TABLET | ORAL | Status: AC

## 2012-06-13 MED ORDER — TECHNETIUM TC 99M DIETHYLENETRIAME-PENTAACETIC ACID: 40.0000 | Freq: Once | INTRAVENOUS | Status: AC | PRN

## 2012-06-13 MED ORDER — ASPIRIN EC 81 MG PO TBEC
81.0000 mg | DELAYED_RELEASE_TABLET | Freq: Every day | ORAL | Status: DC
  Administered 2012-06-14 – 2012-06-18 (×5): 81 mg via ORAL
  Filled 2012-06-13 (×5): qty 1

## 2012-06-13 MED ORDER — LOSARTAN POTASSIUM 50 MG PO TABS
100.0000 mg | ORAL_TABLET | Freq: Every day | ORAL | Status: DC
  Administered 2012-06-13 – 2012-06-18 (×6): 100 mg via ORAL
  Filled 2012-06-13 (×7): qty 2

## 2012-06-13 MED ORDER — PANTOPRAZOLE SODIUM 40 MG PO TBEC
40.0000 mg | DELAYED_RELEASE_TABLET | Freq: Every day | ORAL | Status: DC
  Administered 2012-06-13 – 2012-06-25 (×13): 40 mg via ORAL
  Filled 2012-06-13 (×12): qty 1

## 2012-06-13 MED ORDER — CARVEDILOL 3.125 MG PO TABS
3.1250 mg | ORAL_TABLET | Freq: Two times a day (BID) | ORAL | Status: DC
  Administered 2012-06-13 – 2012-06-25 (×25): 3.125 mg via ORAL
  Filled 2012-06-13 (×29): qty 1

## 2012-06-13 MED ORDER — NITROGLYCERIN 0.4 MG SL SUBL: 0.4000 mg | SUBLINGUAL_TABLET | SUBLINGUAL | Status: DC | PRN

## 2012-06-13 MED ORDER — INSULIN ASPART 100 UNIT/ML ~~LOC~~ SOLN
0.0000 [IU] | Freq: Three times a day (TID) | SUBCUTANEOUS | Status: DC
  Administered 2012-06-13 – 2012-06-14 (×2): 1 [IU] via SUBCUTANEOUS
  Administered 2012-06-14 – 2012-06-15 (×2): 2 [IU] via SUBCUTANEOUS
  Administered 2012-06-16: 1 [IU] via SUBCUTANEOUS
  Administered 2012-06-18 – 2012-06-19 (×2): 2 [IU] via SUBCUTANEOUS
  Administered 2012-06-19 – 2012-06-25 (×4): 1 [IU] via SUBCUTANEOUS

## 2012-06-13 MED ORDER — ONDANSETRON HCL 4 MG/2ML IJ SOLN
4.0000 mg | Freq: Four times a day (QID) | INTRAMUSCULAR | Status: DC | PRN
  Administered 2012-06-13 – 2012-06-24 (×7): 4 mg via INTRAVENOUS
  Filled 2012-06-13 (×7): qty 2

## 2012-06-13 MED ORDER — ASPIRIN EC 81 MG PO TBEC: 81.0000 mg | DELAYED_RELEASE_TABLET | Freq: Every day | ORAL | Status: DC

## 2012-06-13 MED ORDER — ALBUTEROL SULFATE HFA 108 (90 BASE) MCG/ACT IN AERS: 2.0000 | INHALATION_SPRAY | Freq: Four times a day (QID) | RESPIRATORY_TRACT | Status: DC | PRN

## 2012-06-13 MED ORDER — SODIUM CHLORIDE 0.9 % IV SOLN
INTRAVENOUS | Status: AC
  Administered 2012-06-13: 10:00:00 via INTRAVENOUS

## 2012-06-13 MED ORDER — LUBIPROSTONE 24 MCG PO CAPS
24.0000 ug | ORAL_CAPSULE | Freq: Two times a day (BID) | ORAL | Status: DC
  Administered 2012-06-13 – 2012-06-25 (×24): 24 ug via ORAL
  Filled 2012-06-13 (×29): qty 1

## 2012-06-13 MED ORDER — NITROGLYCERIN 2 % TD OINT: 0.5000 [in_us] | TOPICAL_OINTMENT | Freq: Four times a day (QID) | TRANSDERMAL | Status: DC

## 2012-06-13 MED ORDER — NITROGLYCERIN IN D5W 200-5 MCG/ML-% IV SOLN
10.0000 ug/min | INTRAVENOUS | Status: DC
  Administered 2012-06-13: 10 ug/min via INTRAVENOUS
  Filled 2012-06-13: qty 250

## 2012-06-13 MED ORDER — HEPARIN (PORCINE) IN NACL 100-0.45 UNIT/ML-% IJ SOLN
1350.0000 [IU]/h | INTRAMUSCULAR | Status: DC
  Administered 2012-06-13: 1300 [IU]/h via INTRAVENOUS
  Administered 2012-06-13 – 2012-06-15 (×3): 1200 [IU]/h via INTRAVENOUS
  Administered 2012-06-16 – 2012-06-19 (×4): 1350 [IU]/h via INTRAVENOUS
  Filled 2012-06-13 (×13): qty 250

## 2012-06-13 MED ORDER — POLYETHYLENE GLYCOL 3350 17 G PO PACK
17.0000 g | PACK | Freq: Two times a day (BID) | ORAL | Status: DC | PRN
  Administered 2012-06-17 – 2012-06-25 (×6): 17 g via ORAL
  Filled 2012-06-13 (×3): qty 1

## 2012-06-13 MED ORDER — FUROSEMIDE 40 MG PO TABS
40.0000 mg | ORAL_TABLET | Freq: Every day | ORAL | Status: DC
  Administered 2012-06-13 – 2012-06-24 (×12): 40 mg via ORAL
  Filled 2012-06-13 (×14): qty 1

## 2012-06-13 MED ORDER — TECHNETIUM TO 99M ALBUMIN AGGREGATED: 6.0000 | Freq: Once | INTRAVENOUS | Status: AC | PRN

## 2012-06-13 MED ORDER — ASPIRIN 300 MG RE SUPP: 300.0000 mg | RECTAL | Status: AC

## 2012-06-13 MED ORDER — ACETAMINOPHEN 325 MG PO TABS
650.0000 mg | ORAL_TABLET | ORAL | Status: DC | PRN
  Administered 2012-06-14 – 2012-06-22 (×4): 650 mg via ORAL
  Filled 2012-06-13 (×4): qty 2

## 2012-06-13 MED ORDER — AMIODARONE HCL 200 MG PO TABS
200.0000 mg | ORAL_TABLET | Freq: Every day | ORAL | Status: DC
  Administered 2012-06-13 – 2012-06-25 (×13): 200 mg via ORAL
  Filled 2012-06-13 (×13): qty 1

## 2012-06-13 MED ORDER — LEVOTHYROXINE SODIUM 50 MCG PO TABS
50.0000 ug | ORAL_TABLET | Freq: Every day | ORAL | Status: DC
  Administered 2012-06-13 – 2012-06-25 (×13): 50 ug via ORAL
  Filled 2012-06-13 (×14): qty 1

## 2012-06-13 MED ORDER — ISOSORBIDE MONONITRATE ER 60 MG PO TB24
60.0000 mg | ORAL_TABLET | Freq: Every morning | ORAL | Status: DC
  Administered 2012-06-13 – 2012-06-25 (×13): 60 mg via ORAL
  Filled 2012-06-13 (×13): qty 1

## 2012-06-13 MED ORDER — ATORVASTATIN CALCIUM 20 MG PO TABS
20.0000 mg | ORAL_TABLET | Freq: Every day | ORAL | Status: DC
  Administered 2012-06-13 – 2012-06-24 (×12): 20 mg via ORAL
  Filled 2012-06-13 (×13): qty 1

## 2012-06-13 MED ORDER — ONDANSETRON HCL 4 MG/2ML IJ SOLN
4.0000 mg | Freq: Once | INTRAMUSCULAR | Status: AC
  Administered 2012-06-13: 4 mg via INTRAVENOUS
  Filled 2012-06-13: qty 2

## 2012-06-13 MED ORDER — HEPARIN BOLUS VIA INFUSION
5000.0000 [IU] | Freq: Once | INTRAVENOUS | Status: AC
  Administered 2012-06-13: 5000 [IU] via INTRAVENOUS

## 2012-06-13 MED ORDER — HYDROCODONE-ACETAMINOPHEN 5-325 MG PO TABS
1.0000 | ORAL_TABLET | ORAL | Status: DC | PRN
  Administered 2012-06-15 – 2012-06-25 (×9): 1 via ORAL
  Filled 2012-06-13 (×9): qty 1

## 2012-06-13 MED ORDER — GLIMEPIRIDE 1 MG PO TABS
1.0000 mg | ORAL_TABLET | Freq: Every day | ORAL | Status: DC
  Administered 2012-06-13 – 2012-06-25 (×13): 1 mg via ORAL
  Filled 2012-06-13 (×14): qty 1

## 2012-06-13 MED ORDER — MORPHINE SULFATE 2 MG/ML IJ SOLN
2.0000 mg | Freq: Once | INTRAMUSCULAR | Status: AC
  Administered 2012-06-13: 2 mg via INTRAVENOUS
  Filled 2012-06-13: qty 1

## 2012-06-13 NOTE — Progress Notes (Signed)
Patient has arrived to unit from ED; report received, will continue to monitor patient. Lorretta Harp RN

## 2012-06-13 NOTE — ED Notes (Signed)
Presents with CP that began 24 hours ago, pain began in left shoulder and radiates to left chest, left jaw and is constant associated with nausea and pain with inspiration. Given 324 ASA and 1 nitro by EMS with relief of pain.

## 2012-06-13 NOTE — ED Notes (Signed)
Attempted Report 

## 2012-06-13 NOTE — Progress Notes (Signed)
CRITICAL VALUE ALERT  Critical value received:  PTT 181  Date of notification:  06/13/12  Time of notification:  1013  Critical value read back: yes  Nurse who received alert:  Eligah East RN  Pharmacist on unit notified at 1030; dosing is per pharmacy, will continue to monitor patient. Lorretta Harp RN

## 2012-06-13 NOTE — Progress Notes (Signed)
Utilization Review completed.  Netanya Yazdani RN CM  

## 2012-06-13 NOTE — Progress Notes (Signed)
Patient has returned to unit; will continue to monitor patient. Lorretta Harp RN

## 2012-06-13 NOTE — Progress Notes (Signed)
ANTICOAGULATION CONSULT NOTE - Initial Consult  Pharmacy Consult for Heparin  Indication: pulmonary embolus  No Known Allergies  Patient Measurements: Weight: 175 lb 14.8 oz (79.8 kg) Heparin Dosing Weight:   Vital Signs: Temp: 99.4 F (37.4 C) (05/23 0243) Temp src: Oral (05/23 0243) BP: 155/69 mmHg (05/23 0445) Pulse Rate: 71 (05/23 0445)  Labs:  Recent Labs  06/13/12 0308 06/13/12 0309  HGB 10.2*  --   HCT 29.4*  --   PLT 183  --   CREATININE 1.86*  --   TROPONINI  --  <0.30    The CrCl is unknown because both a height and weight (above a minimum accepted value) are required for this calculation.   Medical History: Past Medical History  Diagnosis Date  . GERD (gastroesophageal reflux disease)   . Hypothyroidism   . Chronic back pain   . Stroke   . Gout     no problems in 2 years  . Pulmonary embolism, bilateral 2010  . Hyperlipemia   . Myocardial infarction 1987; 1989  . Anginal pain   . DVT, lower extremity 1980's    LLE  . Asthma     "touch"  . Pneumonia 1970's  . Renal disorder ~ 03/2011    "wasn't functioning right"  . Arthritis     "mostly in my knees and shoulders"  . Stroke     "just a little one"  . Collapsed lung     "for 14 years" right   . Diabetes mellitus   . Hypertension   . Depression   . Anxiety   . Hearing loss   . Glaucoma     Medications:   (Not in a hospital admission)  Assessment: 79 yo with hx of PE taken off anticoagulation last month due to GI bleed. Due for VQ scan this am to confirm. Presents with pleuritic cp and increasing pain with deep inspiration. Due to possibility of life threating embolus will proceed with normal heparin dose for PE and monitor closely for bleeding. Goal of Therapy:  Heparin level 0.3-0.7 units/ml Monitor platelets by anticoagulation protocol: Yes   Plan:  Heparin 5000 units ivx1 then 1300 units/hr HL in 8 hours. Daily HL and cbc starting 5/24   Janice Coffin 06/13/2012,5:38  AM

## 2012-06-13 NOTE — ED Provider Notes (Signed)
History     CSN: [id number]  Arrival date & time 06/13/12  8295   First MD Initiated Contact with Patient 06/13/12 [id number]      Chief Complaint  Patient presents with  . Chest Pain    (Consider location/radiation/quality/duration/timing/severity/associated sxs/prior treatment) HPI Comments: Patient presents to the ER for evaluation of chest pain. Patient reports left-sided chest pain has been present all day. Pain goes into the left shoulder area as well as left jaw. He has had nausea associated with the symptoms, no vomiting. He isn't short of breath, but reports that the pain worsens when he takes a deep breath.  Patient is brought to emergency department by ambulance. He did receive sublingual nitroglycerin with significant improvement in the pain, but pain is now coming back.  Patient is a 79 y.o. male presenting with chest pain.  Chest Pain Associated symptoms: nausea     Past Medical History  Diagnosis Date  . GERD (gastroesophageal reflux disease)   . Hypothyroidism   . Chronic back pain   . Stroke   . Gout     no problems in 2 years  . Pulmonary embolism, bilateral 2010  . Hyperlipemia   . Myocardial infarction 1987; 1989  . Anginal pain   . DVT, lower extremity 1980's    LLE  . Asthma     "touch"  . Pneumonia 1970's  . Renal disorder ~ 03/2011    "wasn't functioning right"  . Arthritis     "mostly in my knees and shoulders"  . Stroke     "just a little one"  . Collapsed lung     "for 14 years" right   . Diabetes mellitus   . Hypertension   . Depression   . Anxiety   . Hearing loss   . Glaucoma     Past Surgical History  Procedure Laterality Date  . Esophagogastroduodenoscopy  12/22/2010    Procedure: ESOPHAGOGASTRODUODENOSCOPY (EGD);  Surgeon: Theda Belfast;  Location: [facility];  Service: Endoscopy;  Laterality: N/A;  . Appendectomy  1950  . Cataract extraction w/ intraocular lens  implant, bilateral Bilateral 1990's  . Cardiac catheterization     . Coronary angioplasty with stent placement      "think I have 4 total"  . Inguinal hernia repair Bilateral 03/28/2012    Procedure: LAPAROSCOPIC BILATERAL INGUINAL HERNIA REPAIR;  Surgeon: Ardeth Sportsman, MD;  Location: [facility] ORS;  Service: General;  Laterality: Bilateral;  . Insertion of mesh Bilateral 03/28/2012    Procedure: INSERTION OF MESH;  Surgeon: Ardeth Sportsman, MD;  Location: [facility];  Service: General;  Laterality: Bilateral;  . Hernia repair    . Esophagogastroduodenoscopy N/A 04/30/2012    Procedure: ESOPHAGOGASTRODUODENOSCOPY (EGD);  Surgeon: Theda Belfast, MD;  Location: [facility] ENDOSCOPY;  Service: Endoscopy;  Laterality: N/A;  . Colonoscopy N/A 05/01/2012    Procedure: COLONOSCOPY;  Surgeon: Theda Belfast, MD;  Location: [facility] ENDOSCOPY;  Service: Endoscopy;  Laterality: N/A;    Family History  Problem Relation Age of Onset  . Diabetes Mother   . Heart disease Father   . Lung cancer Brother   . Stroke Neg Hx   . Hypertension Neg Hx   . Hyperlipidemia Neg Hx   . Alcohol abuse Neg Hx     History  Substance Use Topics  . Smoking status: Former Smoker -- 2.00 packs/day for 3 years    Types: Cigarettes    Start date: 01/27/1946    Quit date: 01/22/1981  .  Smokeless tobacco: Never Used  . Alcohol Use: No     Comment: 07/12/2011; "last drink of alcohol was ~ 1983"      Review of Systems  Cardiovascular: Positive for chest pain.  Gastrointestinal: Positive for nausea.  All other systems reviewed and are negative.    Allergies  Review of patient's allergies indicates no known allergies.  Home Medications   Current Outpatient Rx  Name  Route  Sig  Dispense  Refill  . acetaminophen (TYLENOL) 500 MG tablet   Oral   Take 1,000 mg by mouth every 6 (six) hours as needed for pain.          Marland Kitchen albuterol (PROVENTIL HFA;VENTOLIN HFA) 108 (90 BASE) MCG/ACT inhaler   Inhalation   Inhale 2 puffs into the lungs every 6 (six) hours as needed for shortness of breath.           . ALPRAZolam (XANAX) 0.25 MG tablet   Oral   Take 0.25 mg by mouth at bedtime as needed for sleep.         Marland Kitchen amiodarone (PACERONE) 200 MG tablet   Oral   Take 200 mg by mouth daily.          Marland Kitchen aspirin EC 81 MG tablet   Oral   Take 81 mg by mouth daily.          Marland Kitchen dexlansoprazole (DEXILANT) 60 MG capsule   Oral   Take 60 mg by mouth daily.          . furosemide (LASIX) 40 MG tablet   Oral   Take 40 mg by mouth at bedtime.          Marland Kitchen glimepiride (AMARYL) 1 MG tablet   Oral   Take 1 mg by mouth daily before breakfast.          . HYDROcodone-acetaminophen (NORCO) 5-325 MG per tablet   Oral   Take 1 tablet by mouth every 4 (four) hours as needed for pain.   10 tablet   0   . isosorbide mononitrate (IMDUR) 60 MG 24 hr tablet   Oral   Take 60 mg by mouth every morning.          Marland Kitchen ketotifen (ZADITOR) 0.025 % ophthalmic solution   Right Eye   Place 1 drop into the right eye 2 (two) times daily.         Marland Kitchen levothyroxine (SYNTHROID, LEVOTHROID) 50 MCG tablet   Oral   Take 50 mcg by mouth daily before breakfast.          . losartan (COZAAR) 100 MG tablet   Oral   Take 1 tablet (100 mg total) by mouth daily.   90 tablet   3   . lubiprostone (AMITIZA) 24 MCG capsule   Oral   Take 24 mcg by mouth 2 (two) times daily with a meal.         . nitroGLYCERIN (NITROSTAT) 0.4 MG SL tablet   Sublingual   Place 0.4 mg under the tongue every 5 (five) minutes as needed for chest pain.         . polyethylene glycol (MIRALAX / GLYCOLAX) packet   Oral   Take 17 g by mouth 2 (two) times daily as needed (for constipation).         . polyvinyl alcohol (LIQUIFILM TEARS) 1.4 % ophthalmic solution   Both Eyes   Place 1-2 drops into both eyes as needed (for dry eyes).          Marland Kitchen  ranitidine (ZANTAC) 300 MG capsule   Oral   Take 300 mg by mouth every evening.         . rosuvastatin (CRESTOR) 10 MG tablet   Oral   Take 10 mg by mouth daily.          .  Tamsulosin HCl (FLOMAX) 0.4 MG CAPS   Oral   Take 0.8 mg by mouth at bedtime.            BP 179/58  Pulse 77  Temp(Src) 99.4 F (37.4 C) (Oral)  Resp 16  SpO2 95%  Physical Exam  Constitutional: He is oriented to person, place, and time. He appears well-developed and well-nourished. No distress.  HENT:  Head: Normocephalic and atraumatic.  Right Ear: Hearing normal.  Left Ear: Hearing normal.  Nose: Nose normal.  Mouth/Throat: Oropharynx is clear and moist and mucous membranes are normal.  Eyes: Conjunctivae and EOM are normal. Pupils are equal, round, and reactive to light.  Neck: Normal range of motion. Neck supple.  Cardiovascular: Regular rhythm, S1 normal and S2 normal.  Exam reveals no gallop and no friction rub.   No murmur heard. Pulmonary/Chest: Effort normal and breath sounds normal. No respiratory distress. He exhibits no tenderness.  Abdominal: Soft. Normal appearance and bowel sounds are normal. There is no hepatosplenomegaly. There is generalized tenderness. There is no rebound, no guarding, no tenderness at McBurney's point and negative Murphy's sign. No hernia.  Musculoskeletal: Normal range of motion.  Neurological: He is alert and oriented to person, place, and time. He has normal strength. No cranial nerve deficit or sensory deficit. Coordination normal. GCS eye subscore is 4. GCS verbal subscore is 5. GCS motor subscore is 6.  Skin: Skin is warm, dry and intact. No rash noted. No cyanosis.  Psychiatric: He has a normal mood and affect. His speech is normal and behavior is normal. Thought content normal.    ED Course  Procedures (including critical care time)  EKG:  Date: 06/13/2012  Rate: 80  Rhythm: normal sinus rhythm  QRS Axis: left  Intervals: normal  ST/T Wave abnormalities: LBBB  Conduction Disutrbances:left bundle branch block  Narrative Interpretation:   Old EKG Reviewed: LBBB previously present    Labs Reviewed  CBC WITH DIFFERENTIAL -  Abnormal; Notable for the following:    RBC 3.75 (*)    Hemoglobin 10.2 (*)    HCT 29.4 (*)    Monocytes Relative 16 (*)    Monocytes Absolute 1.6 (*)    All other components within normal limits  COMPREHENSIVE METABOLIC PANEL - Abnormal; Notable for the following:    Glucose, Bld 142 (*)    Creatinine, Ser 1.86 (*)    Albumin 3.3 (*)    GFR calc non Af Amer 33 (*)    GFR calc Af Amer 38 (*)    All other components within normal limits  PRO B NATRIURETIC PEPTIDE - Abnormal; Notable for the following:    Pro B Natriuretic peptide (BNP) 673.7 (*)    All other components within normal limits  D-DIMER, QUANTITATIVE - Abnormal; Notable for the following:    D-Dimer, Quant 4.98 (*)    All other components within normal limits  LIPASE, BLOOD  TROPONIN I  URINALYSIS, ROUTINE W REFLEX MICROSCOPIC  POCT I-STAT TROPONIN I  OCCULT BLOOD, POC DEVICE   Dg Chest Port 1 View  06/13/2012   *RADIOLOGY REPORT*  Clinical Data: Left-sided chest pain.  PORTABLE CHEST - 1 VIEW  Comparison: 06/06/2012.  Findings: The heart is borderline enlarged but stable.  Low lung volumes with vascular crowding and bibasilar atelectasis, left greater than right.  No definite pleural effusions or pulmonary edema.  IMPRESSION: Low lung volumes with vascular crowding and bibasilar atelectasis.   Original Report Authenticated By: Rudie Meyer, M.D.     Diagnosis: Chest pain; uncontrolled hypertension    MDM  Patient comes to the ER for evaluation of chest pain. He is brought by EMS. Patient had been given aspirin prior to arrival. EMS reports that he had pain relief with a single sublingual nitroglycerin but the pain started to come back. He was given additional sublingual nitroglycerin here in the ER and started on a nitroglycerin drip. Patient did not have the same type of response to nitroglycerin here. He was discovered that he is a difficult patient to evaluate, will report no pain and then secondly he reports a 10  pain. It's not clear if he fully understands the questions.  Patient's blood pressure was elevated at arrival. The nitroglycerin drip was maintained despite the patient not having good pain relief with her to help control his blood pressure. He was titrated upwards for blood pressure relief.  Patient's pain was left upper chest and anterior chest area. He reports that it is associated with nausea but also shortness of breath. Patient is reporting that the pain worsens with deep breath. Evaluation of patient's chart reveals that he has a history of PE. Patient was recently taken off his Coumadin (last month) because of a GI bleed secondary to Coumadin. PE was therefore considered as a possibility as the patient is recently off his anticoagulation and is now experiencing pleuritic chest pain. Patient's renal insufficiency precludes CT angiography. Patient will require VQ scan, available later this morning.  Patient rectal was heme negative, it was felt that the patient should be started empirically on heparin.      Gilda Crease, MD 06/13/12 [phone]

## 2012-06-13 NOTE — Progress Notes (Signed)
ANTICOAGULATION CONSULT NOTE - Follow Up Consult  Pharmacy Consult for Heparin Indication: r/o PE vs ACS  No Known Allergies  Patient Measurements: Height: 5\' 8"  (172.7 cm) Weight: 169 lb 1.5 oz (76.7 kg) (scale a) IBW/kg (Calculated) : 68.4 Heparin Dosing Weight: 76.7kg  Vital Signs: Temp: 98.1 F (36.7 C) (05/23 0920) Temp src: Oral (05/23 0920) BP: 195/69 mmHg (05/23 0920) Pulse Rate: 89 (05/23 0920)  Labs:  Recent Labs  06/13/12 0308 06/13/12 0309 06/13/12 0828 06/13/12 0829 06/13/12 1419  HGB 10.2*  --   --  10.0* 9.9*  HCT 29.4*  --   --  29.0* 29.0*  PLT 183  --   --  172 172  APTT  --   --   --  181*  --   LABPROT  --   --   --  15.7*  --   INR  --   --   --  1.28  --   HEPARINUNFRC  --   --   --   --  0.75*  CREATININE 1.86*  --   --  1.66*  --   TROPONINI  --  <0.30 <0.30  --   --     Estimated Creatinine Clearance: 34.3 ml/min (by C-G formula based on Cr of 1.66).   Medications:  IV heparin @ 1300 units/hr  Assessment:  79 yo male continuing on IV heparin for r/o PE vs ACS/STEMI. D-Dimer 4.98, Trop < 0.3, VQ scan pending today. First heparin level is a little above goal at 0.75. Hgb 9.9, Plts 172. Per RN, no issues with heparin line and no bleeding issues.   Goal of Therapy:  Heparin level 0.3-0.7 units/ml Monitor platelets by anticoagulation protocol: Yes   Plan:  1) Decrease IV heparin to 1200 units/hr. 2) Check 8-hr heparin level 3) Follow-up daily heparin level and CBC 4) Monitor signs/symtpoms of bleeding 5) F/u VQ scan   Benjaman Pott, PharmD, BCPS 06/13/2012   2:58 PM

## 2012-06-13 NOTE — Progress Notes (Signed)
Patient will be transported to Nuclear Medicine for procedure. Lorretta Harp RN

## 2012-06-13 NOTE — ED Notes (Signed)
Dr. Sharyn Lull at the bedside with pt.

## 2012-06-13 NOTE — H&P (Signed)
[name] is an 79 y.o. male.   Chief Complaint: Chest pain HPI: Patient is 79 year old male with past medical history significant for multiple medical problems i.e. coronary artery disease history of MI x2 in the past status post PTCA stenting to LAD left circumflex and RCA in the past ischemic cardiomyopathy, history of congestive heart failure secondary to systolic dysfunction, history of bilateral pulmonary embolism in the past, status post recent lower GI bleeding/Coumadin toxicity requiring stopping of anticoagulants History of paroxysmal A. fib, hypertension, non-insulin-dependent diabetes mellitus, history of DVT in the past, history of remote tobacco abuse, chronic kidney disease stage III, hypothyroidism, history of pancreatitis in the past, GERD, chronic abdominal pain extensive workup negative, came to the ER complaining of pleuritic retrosternal and left-sided chest pain radiating to left shoulder and left arm off and on since yesterday associated with mild shortness of breath and nausea. Denies any cough fever chills. Patient also gives history of exertional dyspnea feeling tired fatigue. Also complains of vague abdominal pain. Denies any vomiting denies constipation or diarrhea. Denies any palpitation lightheadedness or syncope denies pain the orthopnea complains of minimal leg swelling. Patient received sublingual nitroglycerin with partial relief of chest pain. EKG done in the ER showed normal sinus rhythm with left bundle branch block her also patient was noted to have elevated d-dimer. Patient empirically started on heparin with improvement in his symptoms CT angiogram be done due to elevated creatinine VQ scan is still pending  Past Medical History  Diagnosis Date  . GERD (gastroesophageal reflux disease)   . Hypothyroidism   . Chronic back pain   . Stroke   . Gout     no problems in 2 years  . Pulmonary embolism, bilateral 2010  . Hyperlipemia   . Myocardial infarction 1987;  1989  . Anginal pain   . DVT, lower extremity 1980's    LLE  . Asthma     "touch"  . Pneumonia 1970's  . Renal disorder ~ 03/2011    "wasn't functioning right"  . Arthritis     "mostly in my knees and shoulders"  . Stroke     "just a little one"  . Collapsed lung     "for 14 years" right   . Diabetes mellitus   . Hypertension   . Depression   . Anxiety   . Hearing loss   . Glaucoma     Past Surgical History  Procedure Laterality Date  . Esophagogastroduodenoscopy  12/22/2010    Procedure: ESOPHAGOGASTRODUODENOSCOPY (EGD);  Surgeon: Theda Belfast;  Location: [facility] ENDOSCOPY;  Service: Endoscopy;  Laterality: N/A;  . Appendectomy  1950  . Cataract extraction w/ intraocular lens  implant, bilateral Bilateral 1990's  . Cardiac catheterization    . Coronary angioplasty with stent placement      "think I have 4 total"  . Inguinal hernia repair Bilateral 03/28/2012    Procedure: LAPAROSCOPIC BILATERAL INGUINAL HERNIA REPAIR;  Surgeon: Ardeth Sportsman, MD;  Location: [facility] ORS;  Service: General;  Laterality: Bilateral;  . Insertion of mesh Bilateral 03/28/2012    Procedure: INSERTION OF MESH;  Surgeon: Ardeth Sportsman, MD;  Location: [facility] ORS;  Service: General;  Laterality: Bilateral;  . Hernia repair    . Esophagogastroduodenoscopy N/A 04/30/2012    Procedure: ESOPHAGOGASTRODUODENOSCOPY (EGD);  Surgeon: Theda Belfast, MD;  Location: [facility] ENDOSCOPY;  Service: Endoscopy;  Laterality: N/A;  . Colonoscopy N/A 05/01/2012    Procedure: COLONOSCOPY;  Surgeon: Theda Belfast, MD;  Location: [facility]  ENDOSCOPY;  Service: Endoscopy;  Laterality: N/A;    Family History  Problem Relation Age of Onset  . Diabetes Mother   . Heart disease Father   . Lung cancer Brother   . Stroke Neg Hx   . Hypertension Neg Hx   . Hyperlipidemia Neg Hx   . Alcohol abuse Neg Hx    Social History:  reports that he quit smoking about 31 years ago. His smoking use included Cigarettes. He started smoking about 66 years ago.  He has a 6 pack-year smoking history. He has never used smokeless tobacco. He reports that he uses illicit drugs (Hashish and Marijuana). He reports that he does not drink alcohol.  Allergies: No Known Allergies   (Not in a hospital admission)  Results for orders placed during the hospital encounter of 06/13/12 (from the past 48 hour(s))  CBC WITH DIFFERENTIAL     Status: Abnormal   Collection Time    06/13/12  3:08 AM      Result Value Range   WBC 10.0  4.0 - 10.5 K/uL   RBC 3.75 (*) 4.22 - 5.81 MIL/uL   Hemoglobin 10.2 (*) 13.0 - 17.0 g/dL   HCT 91.4 (*) 78.2 - 95.6 %   MCV 78.4  78.0 - 100.0 fL   MCH 27.2  26.0 - 34.0 pg   MCHC 34.7  30.0 - 36.0 g/dL   RDW 21.3  08.6 - 57.8 %   Platelets 183  150 - 400 K/uL   Neutrophils Relative % 65  43 - 77 %   Neutro Abs 6.5  1.7 - 7.7 K/uL   Lymphocytes Relative 18  12 - 46 %   Lymphs Abs 1.8  0.7 - 4.0 K/uL   Monocytes Relative 16 (*) 3 - 12 %   Monocytes Absolute 1.6 (*) 0.1 - 1.0 K/uL   Eosinophils Relative 0  0 - 5 %   Eosinophils Absolute 0.0  0.0 - 0.7 K/uL   Basophils Relative 0  0 - 1 %   Basophils Absolute 0.0  0.0 - 0.1 K/uL  COMPREHENSIVE METABOLIC PANEL     Status: Abnormal   Collection Time    06/13/12  3:08 AM      Result Value Range   Sodium 140  135 - 145 mEq/L   Potassium 4.2  3.5 - 5.1 mEq/L   Chloride 105  96 - 112 mEq/L   CO2 28  19 - 32 mEq/L   Glucose, Bld 142 (*) 70 - 99 mg/dL   BUN 16  6 - 23 mg/dL   Creatinine, Ser 4.69 (*) 0.50 - 1.35 mg/dL   Calcium 9.0  8.4 - 62.9 mg/dL   Total Protein 6.9  6.0 - 8.3 g/dL   Albumin 3.3 (*) 3.5 - 5.2 g/dL   AST 22  0 - 37 U/L   ALT 17  0 - 53 U/L   Alkaline Phosphatase 85  39 - 117 U/L   Total Bilirubin 0.4  0.3 - 1.2 mg/dL   GFR calc non Af Amer 33 (*) >90 mL/min   GFR calc Af Amer 38 (*) >90 mL/min   Comment:            The eGFR has been calculated     using the CKD EPI equation.     This calculation has not been     validated in all clinical     situations.      eGFR's persistently     <90  mL/min signify     possible Chronic Kidney Disease.  LIPASE, BLOOD     Status: None   Collection Time    06/13/12  3:08 AM      Result Value Range   Lipase 31  11 - 59 U/L  D-DIMER, QUANTITATIVE     Status: Abnormal   Collection Time    06/13/12  3:08 AM      Result Value Range   D-Dimer, Quant 4.98 (*) 0.00 - 0.48 ug/mL-FEU   Comment:            AT THE INHOUSE ESTABLISHED CUTOFF     VALUE OF 0.48 ug/mL FEU,     THIS ASSAY HAS BEEN DOCUMENTED     IN THE LITERATURE TO HAVE     A SENSITIVITY AND NEGATIVE     PREDICTIVE VALUE OF AT LEAST     98 TO 99%.  THE TEST RESULT     SHOULD BE CORRELATED WITH     AN ASSESSMENT OF THE CLINICAL     PROBABILITY OF DVT / VTE.  TROPONIN I     Status: None   Collection Time    06/13/12  3:09 AM      Result Value Range   Troponin I <0.30  <0.30 ng/mL   Comment:            Due to the release kinetics of cTnI,     a negative result within the first hours     of the onset of symptoms does not rule out     myocardial infarction with certainty.     If myocardial infarction is still suspected,     repeat the test at appropriate intervals.  PRO B NATRIURETIC PEPTIDE     Status: Abnormal   Collection Time    06/13/12  3:09 AM      Result Value Range   Pro B Natriuretic peptide (BNP) 673.7 (*) 0 - 450 pg/mL  POCT I-STAT TROPONIN I     Status: None   Collection Time    06/13/12  3:50 AM      Result Value Range   Troponin i, poc 0.01  0.00 - 0.08 ng/mL   Comment 3            Comment: Due to the release kinetics of cTnI,     a negative result within the first hours     of the onset of symptoms does not rule out     myocardial infarction with certainty.     If myocardial infarction is still suspected,     repeat the test at appropriate intervals.  URINALYSIS, ROUTINE W REFLEX MICROSCOPIC     Status: None   Collection Time    06/13/12  4:29 AM      Result Value Range   Color, Urine YELLOW  YELLOW   APPearance  CLEAR  CLEAR   Specific Gravity, Urine 1.015  1.005 - 1.030   pH 6.0  5.0 - 8.0   Glucose, UA NEGATIVE  NEGATIVE mg/dL   Hgb urine dipstick NEGATIVE  NEGATIVE   Bilirubin Urine NEGATIVE  NEGATIVE   Ketones, ur NEGATIVE  NEGATIVE mg/dL   Protein, ur NEGATIVE  NEGATIVE mg/dL   Urobilinogen, UA 0.2  0.0 - 1.0 mg/dL   Nitrite NEGATIVE  NEGATIVE   Leukocytes, UA NEGATIVE  NEGATIVE   Comment: MICROSCOPIC NOT DONE ON URINES WITH NEGATIVE PROTEIN, BLOOD, LEUKOCYTES, NITRITE, OR GLUCOSE <1000 mg/dL.  OCCULT BLOOD, POC  DEVICE     Status: None   Collection Time    06/13/12  4:37 AM      Result Value Range   Fecal Occult Bld NEGATIVE  NEGATIVE   Dg Chest Port 1 View  06/13/2012   *RADIOLOGY REPORT*  Clinical Data: Left-sided chest pain.  PORTABLE CHEST - 1 VIEW  Comparison: 06/06/2012.  Findings: The heart is borderline enlarged but stable.  Low lung volumes with vascular crowding and bibasilar atelectasis, left greater than right.  No definite pleural effusions or pulmonary edema.  IMPRESSION: Low lung volumes with vascular crowding and bibasilar atelectasis.   Original Report Authenticated By: Rudie Meyer, M.D.    Review of Systems  Constitutional: Negative for fever and chills.  Eyes: Negative for blurred vision and double vision.  Respiratory: Positive for shortness of breath. Negative for cough, hemoptysis, sputum production and wheezing.   Cardiovascular: Positive for chest pain and leg swelling. Negative for palpitations, orthopnea and claudication.  Gastrointestinal: Positive for nausea and abdominal pain. Negative for vomiting.  Neurological: Negative for dizziness and headaches.    Blood pressure 155/60, pulse 74, temperature 99.4 F (37.4 C), temperature source Oral, resp. rate 17, weight 79.8 kg (175 lb 14.8 oz), SpO2 93.00%. Physical Exam  Constitutional: He is oriented to person, place, and time.  HENT:  Head: Normocephalic and atraumatic.  Eyes: Conjunctivae are normal. Left  eye exhibits no discharge. No scleral icterus.  Neck: Normal range of motion. Neck supple. No JVD present. No tracheal deviation present. No thyromegaly present.  Cardiovascular: Normal rate and regular rhythm.   Murmur (Soft systolic murmur and S3 gallop noted) heard. Respiratory: No respiratory distress.  Decrease breath sound at bases  GI: Soft. Bowel sounds are normal. He exhibits no distension. There is no tenderness. There is no rebound.  Musculoskeletal:  No clubbing cyanosis trace edema noted  Neurological: He is alert and oriented to person, place, and time.     Assessment/Plan Atypical chest pain with elevated d-dimer rule out PE/rule out MI Coronary artery disease history of multiple PCI's in the past Mild systolic heart failure History of pulmonary embolism in the past History of DVT in the past History of paroxysmal atrial fibrillation in the past Hypertension Diabetes mellitus Hypercholesteremia Remote tobacco abuse Chronic kidney disease stage III Hypothyroidism History of pancreatitis GERD Chronic abdominal pain Plan As per orders Check VQ scan Dr. [name] on-call for weekend Agree with IV heparin for now [id number] [facility] N 06/13/2012, 8:02 AM

## 2012-06-14 LAB — LIPID PANEL
Cholesterol: 115 mg/dL (ref 0–200)
HDL: 73 mg/dL (ref >39)
Total CHOL/HDL Ratio: 1.6 RATIO
Triglycerides: 47 mg/dL (ref <150)

## 2012-06-14 LAB — BASIC METABOLIC PANEL
BUN: 15 mg/dL (ref 6–23)
Chloride: 106 mEq/L (ref 96–112)
GFR calc Af Amer: 42 mL/min — ABNORMAL LOW (ref >90)
Potassium: 3.7 mEq/L (ref 3.5–5.1)

## 2012-06-14 LAB — GLUCOSE, CAPILLARY
Glucose-Capillary: 161 mg/dL — ABNORMAL HIGH (ref 70–99)
Glucose-Capillary: 99 mg/dL (ref 70–99)
Glucose-Capillary: 124 mg/dL — ABNORMAL HIGH (ref 70–99)

## 2012-06-14 LAB — CBC
HCT: 27.8 % — ABNORMAL LOW (ref 39.0–52.0)
RDW: 14.6 % (ref 11.5–15.5)
WBC: 10.1 10*3/uL (ref 4.0–10.5)

## 2012-06-14 MED ORDER — POLYVINYL ALCOHOL 1.4 % OP SOLN
1.0000 [drp] | OPHTHALMIC | Status: DC | PRN
  Filled 2012-06-14: qty 15

## 2012-06-14 NOTE — Progress Notes (Signed)
Subjective:  Feeling better. Some left sided chest pain with cough. No hemoptysis this AM.  Objective:  Vital Signs in the last 24 hours: Temp:  [98 F (36.7 C)-99.3 F (37.4 C)] 98.4 F (36.9 C) (05/24 0505) Pulse Rate:  [58-89] 58 (05/24 0505) Cardiac Rhythm:  [-] Normal sinus rhythm (05/23 2130) Resp:  [18-20] 18 (05/24 0505) BP: (124-195)/(60-75) 124/62 mmHg (05/24 0505) SpO2:  [91 %-100 %] 92 % (05/24 0505) Weight:  [75.841 kg (167 lb 3.2 oz)-76.7 kg (169 lb 1.5 oz)] 75.841 kg (167 lb 3.2 oz) (05/24 0505)  Physical Exam: BP Readings from Last 1 Encounters:  06/14/12 124/62     Wt Readings from Last 1 Encounters:  06/14/12 75.841 kg (167 lb 3.2 oz)    Weight change: -3.1 kg (-6 lb 13.3 oz)  HEENT: [place]/AT, Eyes-Brown, PERL, EOMI, Conjunctiva-Pale pink, Sclera-Non-icteric Neck: No JVD, No bruit, Trachea midline. Lungs:  Clear, Bilateral. Cardiac:  Regular rhythm, normal S1 and S2, no S3.  Abdomen:  Soft, non-tender. Extremities:  Trace edema present. No cyanosis. No clubbing. CNS: AxOx3, Cranial nerves grossly intact, moves all 4 extremities. Right handed. Skin: Warm and dry.   Intake/Output from previous day: 05/23 0701 - 05/24 0700 In: 1513.3 [P.O.:720; I.V.:793.3] Out: 1949 [Urine:1948; Stool:1]    Lab Results: BMET    Component Value Date/Time   NA 140 06/14/2012 0455   K 3.7 06/14/2012 0455   CL 106 06/14/2012 0455   CO2 24 06/14/2012 0455   GLUCOSE 145* 06/14/2012 0455   BUN 15 06/14/2012 0455   CREATININE 1.70* 06/14/2012 0455   CALCIUM 8.5 06/14/2012 0455   GFRNONAA 36* 06/14/2012 0455   GFRAA 42* 06/14/2012 0455   CBC    Component Value Date/Time   WBC 10.1 06/14/2012 0455   RBC 3.55* 06/14/2012 0455   HGB 9.5* 06/14/2012 0455   HCT 27.8* 06/14/2012 0455   PLT 165 06/14/2012 0455   MCV 78.3 06/14/2012 0455   MCH 26.8 06/14/2012 0455   MCHC 34.2 06/14/2012 0455   RDW 14.6 06/14/2012 0455   LYMPHSABS 1.6 06/13/2012 0829   MONOABS 1.6* 06/13/2012 0829   EOSABS  0.1 06/13/2012 0829   BASOSABS 0.0 06/13/2012 0829   CARDIAC ENZYMES Lab Results  Component Value Date   CKTOTAL 48 07/19/2011   CKMB 1.4 07/19/2011   TROPONINI <0.30 06/13/2012    Scheduled Meds: . amiodarone  200 mg Oral Daily  . aspirin EC  81 mg Oral Daily  . atorvastatin  20 mg Oral q1800  . carvedilol  3.125 mg Oral BID WC  . furosemide  40 mg Oral QHS  . glimepiride  1 mg Oral QAC breakfast  . insulin aspart  0-9 Units Subcutaneous TID WC  . isosorbide mononitrate  60 mg Oral q morning - 10a  . levothyroxine  50 mcg Oral QAC breakfast  . losartan  100 mg Oral Daily  . lubiprostone  24 mcg Oral BID WC  . pantoprazole  40 mg Oral Daily   Continuous Infusions: . heparin 1,200 Units/hr (06/13/12 2251)   PRN Meds:.acetaminophen, albuterol, HYDROcodone-acetaminophen, nitroGLYCERIN, ondansetron (ZOFRAN) IV, polyethylene glycol  Assessment/Plan: Atypical chest pain with hemoptysis Probable pulmonary embolism  Coronary artery disease history of multiple PCI's in the past  Mild systolic heart failure  History of pulmonary embolism in the past  History of DVT in the past  History of paroxysmal atrial fibrillation in the past  Hypertension  Diabetes mellitus  Hypercholesteremia  Remote tobacco abuse  Chronic kidney  disease stage III  Hypothyroidism  History of pancreatitis  GERD  Chronic abdominal pain Anemia of chronic disease + blood loss  Monitor H & H.   LOS: 1 day    Orpah Cobb  MD  06/14/2012, 9:14 AM

## 2012-06-14 NOTE — Progress Notes (Signed)
ANTICOAGULATION CONSULT NOTE - Follow Up Consult  Pharmacy Consult for Heparin Indication: r/o PE vs ACS  No Known Allergies  Patient Measurements: Height: 5\' 8"  (172.7 cm) Weight: 167 lb 3.2 oz (75.841 kg) (scale a) IBW/kg (Calculated) : 68.4 Heparin Dosing Weight: 76.7kg  Vital Signs: Temp: 98.4 F (36.9 C) (05/24 0505) Temp src: Oral (05/24 0505) BP: 124/62 mmHg (05/24 0505) Pulse Rate: 58 (05/24 0505)  Labs:  Recent Labs  06/13/12 0308  06/13/12 0828 06/13/12 0829 06/13/12 1419 06/13/12 2014 06/14/12 0002 06/14/12 0411 06/14/12 0455  HGB 10.2*  --   --  10.0* 9.9*  --   --   --  9.5*  HCT 29.4*  --   --  29.0* 29.0*  --   --   --  27.8*  PLT 183  --   --  172 172  --   --   --  165  APTT  --   --   --  181*  --   --   --   --   --   LABPROT  --   --   --  15.7*  --   --   --   --   --   INR  --   --   --  1.28  --   --   --   --   --   HEPARINUNFRC  --   --   --   --  0.75*  --  0.70 0.60  --   CREATININE 1.86*  --   --  1.66*  --   --   --   --  1.70*  TROPONINI  --   < > <0.30  --  <0.30 <0.30  --   --   --   < > = values in this interval not displayed.  Estimated Creatinine Clearance: 33.5 ml/min (by C-G formula based on Cr of 1.7).   Medications:  IV heparin @ 1300 units/hr  Assessment:  Heparin level is 0.60 this morning, therapeutic. AM heparin level was only 4 hr after last draw. VQ scan reveals intermeditate chance of PE with filling defecit. Continue at current rate and monitor for bleeding. Nurse noted overnight pt coughing up small amounts of bloody sputum. MD was notified, no changes noted.  CBC stable.   Goal of Therapy:  Heparin level 0.3-0.7 units/ml Monitor platelets by anticoagulation protocol: Yes   Plan:  Cont heparin at 1200 units/hr Repeat 8 hr heparin level Daily heparin level Watch for s/s bleeding Monitor CBC   Doris Cheadle, PharmD Clinical Pharmacist Pager: [phone] Phone: [phone] 06/14/2012 7:56 AM

## 2012-06-14 NOTE — Progress Notes (Signed)
Patient is coughing up a small amount of bloody sputum.  MD notified.

## 2012-06-14 NOTE — Progress Notes (Addendum)
ANTICOAGULATION CONSULT NOTE - Follow Up Consult  Pharmacy Consult for Heparin Indication: r/o PE vs ACS  No Known Allergies  Patient Measurements: Height: 5\' 8"  (172.7 cm) Weight: 169 lb 1.5 oz (76.7 kg) (scale a) IBW/kg (Calculated) : 68.4 Heparin Dosing Weight: 76.7kg  Vital Signs: Temp: 99.3 F (37.4 C) (05/23 2050) Temp src: Oral (05/23 2050) BP: 167/73 mmHg (05/24 0041) Pulse Rate: 72 (05/24 0041)  Labs:  Recent Labs  06/13/12 0308  06/13/12 0828 06/13/12 0829 06/13/12 1419 06/13/12 2014 06/14/12 0002  HGB 10.2*  --   --  10.0* 9.9*  --   --   HCT 29.4*  --   --  29.0* 29.0*  --   --   PLT 183  --   --  172 172  --   --   APTT  --   --   --  181*  --   --   --   LABPROT  --   --   --  15.7*  --   --   --   INR  --   --   --  1.28  --   --   --   HEPARINUNFRC  --   --   --   --  0.75*  --  0.70  CREATININE 1.86*  --   --  1.66*  --   --   --   TROPONINI  --   < > <0.30  --  <0.30 <0.30  --   < > = values in this interval not displayed.  Estimated Creatinine Clearance: 34.3 ml/min (by C-G formula based on Cr of 1.66).   Medications:  IV heparin @ 1300 units/hr  Assessment:  Heparin level is 0.70 after rate decrease. VQ scan reveals intermeditate chance of PE with filling defecit. Continue at current rate and monitor for bleeding. Although at high end of goal this is most probably a bolus effect. If increasing with am labs will further decrease rate.  Goal of Therapy:  Heparin level 0.3-0.7 units/ml Monitor platelets by anticoagulation protocol: Yes   Plan:  Continue at current rate and follow up am labs.    Janice Coffin 12:50 AM

## 2012-06-14 NOTE — Progress Notes (Signed)
ANTICOAGULATION CONSULT NOTE - Follow Up Consult  Pharmacy Consult for Heparin Indication: r/o PE vs ACS  No Known Allergies  Patient Measurements: Height: 5\' 8"  (172.7 cm) Weight: 167 lb 3.2 oz (75.841 kg) (scale a) IBW/kg (Calculated) : 68.4 Heparin Dosing Weight: 76.7kg  Vital Signs: Temp: 98.6 F (37 C) (05/24 1400) Temp src: Oral (05/24 1400) BP: 143/84 mmHg (05/24 1400) Pulse Rate: 99 (05/24 1400)  Labs:  Recent Labs  06/13/12 0308  06/13/12 0828 06/13/12 0829  06/13/12 1419 06/13/12 2014 06/14/12 0002 06/14/12 0411 06/14/12 0455 06/14/12 1610  HGB 10.2*  --   --  10.0*  --  9.9*  --   --   --  9.5*  --   HCT 29.4*  --   --  29.0*  --  29.0*  --   --   --  27.8*  --   PLT 183  --   --  172  --  172  --   --   --  165  --   APTT  --   --   --  181*  --   --   --   --   --   --   --   LABPROT  --   --   --  15.7*  --   --   --   --   --   --   --   INR  --   --   --  1.28  --   --   --   --   --   --   --   HEPARINUNFRC  --   --   --   --   < > 0.75*  --  0.70 0.60  --  0.62  CREATININE 1.86*  --   --  1.66*  --   --   --   --   --  1.70*  --   TROPONINI  --   < > <0.30  --   --  <0.30 <0.30  --   --   --   --   < > = values in this interval not displayed.  Estimated Creatinine Clearance: 33.5 ml/min (by C-G formula based on Cr of 1.7).   Medications:  IV heparin @ 1200 units/hr  Assessment:  The confirmatory Heparin level is 0.62 this afternoon, therapeutic.  VQ scan reveals intermeditate chance of PE with filling defecit. Continue at current rate and monitor for bleeding. Nurse noted overnight pt coughing up small amounts of bloody sputum. MD was notified, no changes noted.  CBC stable.   Goal of Therapy:  Heparin level 0.3-0.7 units/ml Monitor platelets by anticoagulation protocol: Yes   Plan:  Cont heparin at 1200 units/hr Daily heparin level Watch for s/s bleeding Monitor CBC   Link Snuffer, PharmD, BCPS Clinical Pharmacist [phone]   06/14/2012 4:45 PM

## 2012-06-15 LAB — CBC
Platelets: 178 10*3/uL (ref 150–400)
RBC: 3.72 MIL/uL — ABNORMAL LOW (ref 4.22–5.81)
WBC: 10.2 10*3/uL (ref 4.0–10.5)

## 2012-06-15 LAB — GLUCOSE, CAPILLARY: Glucose-Capillary: 117 mg/dL — ABNORMAL HIGH (ref 70–99)

## 2012-06-15 LAB — HEPARIN LEVEL (UNFRACTIONATED): Heparin Unfractionated: 0.4 IU/mL (ref 0.30–0.70)

## 2012-06-15 MED ORDER — SODIUM CHLORIDE 0.9 % IV SOLN
INTRAVENOUS | Status: DC | PRN
  Administered 2012-06-15 (×2): via INTRAVENOUS

## 2012-06-15 NOTE — Progress Notes (Signed)
ANTICOAGULATION CONSULT NOTE - Follow Up Consult  Pharmacy Consult for Heparin Indication: r/o PE vs ACS  No Known Allergies  Patient Measurements: Height: 5\' 8"  (172.7 cm) Weight: 167 lb 3.2 oz (75.841 kg) (scale a) IBW/kg (Calculated) : 68.4 Heparin Dosing Weight: 76.7kg  Vital Signs: Temp: 98.6 F (37 C) (05/25 0451) Temp src: Oral (05/25 0451) BP: 161/74 mmHg (05/25 0451) Pulse Rate: 60 (05/25 0451)  Labs:  Recent Labs  06/13/12 0308  06/13/12 0828 06/13/12 0829 06/13/12 1419 06/13/12 2014  06/14/12 0411 06/14/12 0455 06/14/12 1610 06/15/12 0510  HGB 10.2*  --   --  10.0* 9.9*  --   --   --  9.5*  --  9.9*  HCT 29.4*  --   --  29.0* 29.0*  --   --   --  27.8*  --  28.8*  PLT 183  --   --  172 172  --   --   --  165  --  178  APTT  --   --   --  181*  --   --   --   --   --   --   --   LABPROT  --   --   --  15.7*  --   --   --   --   --   --   --   INR  --   --   --  1.28  --   --   --   --   --   --   --   HEPARINUNFRC  --   --   --   --  0.75*  --   < > 0.60  --  0.62 0.40  CREATININE 1.86*  --   --  1.66*  --   --   --   --  1.70*  --   --   TROPONINI  --   < > <0.30  --  <0.30 <0.30  --   --   --   --   --   < > = values in this interval not displayed.  Estimated Creatinine Clearance: 33.5 ml/min (by C-G formula based on Cr of 1.7).   Medications:  IV heparin @ 1200 units/hr  Assessment:  Heparin level remains therapeutic at 0.4. VQ scan reveals intermeditate chance of PE with filling defecit. Continue at current rate and monitor for bleeding. CBC stable, no further hemoptysis noted.    Goal of Therapy:  Heparin level 0.3-0.7 units/ml Monitor platelets by anticoagulation protocol: Yes   Plan:  Cont heparin at 1200 units/hr Daily heparin level Watch for s/s bleeding Monitor CBC   Doris Cheadle, PharmD Clinical Pharmacist Pager: [phone] Phone: [phone] 06/15/2012 7:33 AM

## 2012-06-15 NOTE — Progress Notes (Signed)
Subjective:  Feeling better. Not coughing up blood.Afebrile.  Objective:  Vital Signs in the last 24 hours: Temp:  [98.6 F (37 C)-98.7 F (37.1 C)] 98.6 F (37 C) (05/25 0451) Pulse Rate:  [60-99] 60 (05/25 0757) Cardiac Rhythm:  [-] Normal sinus rhythm (05/24 2115) Resp:  [18-20] 18 (05/25 0757) BP: (134-173)/(60-84) 158/70 mmHg (05/25 0757) SpO2:  [93 %-97 %] 93 % (05/25 0757) Weight:  [75.841 kg (167 lb 3.2 oz)] 75.841 kg (167 lb 3.2 oz) (05/25 0451)  Physical Exam: BP Readings from Last 1 Encounters:  06/15/12 158/70     Wt Readings from Last 1 Encounters:  06/15/12 75.841 kg (167 lb 3.2 oz)    Weight change: -0.859 kg (-1 lb 14.3 oz)  HEENT: [place]/AT, Eyes-Brown, PERL, EOMI, Conjunctiva-Pale pink, Sclera-Non-icteric Neck: No JVD, No bruit, Trachea midline. Lungs:  Clear, Bilateral. Cardiac:  Regular rhythm, normal S1 and S2, no S3.  Abdomen:  Soft, non-tender. Extremities:  Trace edema present. No cyanosis. No clubbing. CNS: AxOx3, Cranial nerves grossly intact, moves all 4 extremities. Right handed. Skin: Warm and dry.   Intake/Output from previous day: 05/24 0701 - 05/25 0700 In: 820 [P.O.:820] Out: 1452 [Urine:1450; Stool:2]    Lab Results: BMET    Component Value Date/Time   NA 140 06/14/2012 0455   K 3.7 06/14/2012 0455   CL 106 06/14/2012 0455   CO2 24 06/14/2012 0455   GLUCOSE 145* 06/14/2012 0455   BUN 15 06/14/2012 0455   CREATININE 1.70* 06/14/2012 0455   CALCIUM 8.5 06/14/2012 0455   GFRNONAA 36* 06/14/2012 0455   GFRAA 42* 06/14/2012 0455   CBC    Component Value Date/Time   WBC 10.2 06/15/2012 0510   RBC 3.72* 06/15/2012 0510   HGB 9.9* 06/15/2012 0510   HCT 28.8* 06/15/2012 0510   PLT 178 06/15/2012 0510   MCV 77.4* 06/15/2012 0510   MCH 26.6 06/15/2012 0510   MCHC 34.4 06/15/2012 0510   RDW 14.7 06/15/2012 0510   LYMPHSABS 1.6 06/13/2012 0829   MONOABS 1.6* 06/13/2012 0829   EOSABS 0.1 06/13/2012 0829   BASOSABS 0.0 06/13/2012 0829   CARDIAC  ENZYMES Lab Results  Component Value Date   CKTOTAL 48 07/19/2011   CKMB 1.4 07/19/2011   TROPONINI <0.30 06/13/2012    Scheduled Meds: . amiodarone  200 mg Oral Daily  . aspirin EC  81 mg Oral Daily  . atorvastatin  20 mg Oral q1800  . carvedilol  3.125 mg Oral BID WC  . furosemide  40 mg Oral QHS  . glimepiride  1 mg Oral QAC breakfast  . insulin aspart  0-9 Units Subcutaneous TID WC  . isosorbide mononitrate  60 mg Oral q morning - 10a  . levothyroxine  50 mcg Oral QAC breakfast  . losartan  100 mg Oral Daily  . lubiprostone  24 mcg Oral BID WC  . pantoprazole  40 mg Oral Daily   Continuous Infusions: . heparin 1,200 Units/hr (06/14/12 2027)   PRN Meds:.acetaminophen, albuterol, HYDROcodone-acetaminophen, nitroGLYCERIN, ondansetron (ZOFRAN) IV, polyethylene glycol, polyvinyl alcohol  Assessment/Plan: Atypical chest pain with hemoptysis  Probable pulmonary embolism  Coronary artery disease history of multiple PCI's in the past  Mild systolic heart failure  History of pulmonary embolism in the past  History of DVT in the past  History of paroxysmal atrial fibrillation in the past  Hypertension  Diabetes mellitus  Hypercholesteremia  Remote tobacco abuse  Chronic kidney disease stage III  Hypothyroidism  History of pancreatitis  GERD  Chronic abdominal pain  Anemia of chronic disease + blood loss  Continue medical treatment.    LOS: 2 days    Orpah Cobb  MD  06/15/2012, 9:23 AM

## 2012-06-16 DIAGNOSIS — Z86718 Personal history of other venous thrombosis and embolism: Secondary | ICD-10-CM

## 2012-06-16 LAB — BASIC METABOLIC PANEL
CO2: 26 mEq/L (ref 19–32)
Calcium: 8.7 mg/dL (ref 8.4–10.5)
Creatinine, Ser: 1.6 mg/dL — ABNORMAL HIGH (ref 0.50–1.35)
Glucose, Bld: 84 mg/dL (ref 70–99)

## 2012-06-16 LAB — ANTITHROMBIN III: AntiThromb III Func: 61 % — ABNORMAL LOW (ref 75–120)

## 2012-06-16 LAB — GLUCOSE, CAPILLARY
Glucose-Capillary: 96 mg/dL (ref 70–99)
Glucose-Capillary: 124 mg/dL — ABNORMAL HIGH (ref 70–99)
Glucose-Capillary: 89 mg/dL (ref 70–99)

## 2012-06-16 LAB — CBC
Hemoglobin: 9.5 g/dL — ABNORMAL LOW (ref 13.0–17.0)
MCH: 26.7 pg (ref 26.0–34.0)
MCV: 77.5 fL — ABNORMAL LOW (ref 78.0–100.0)
RBC: 3.56 MIL/uL — ABNORMAL LOW (ref 4.22–5.81)

## 2012-06-16 LAB — HOMOCYSTEINE: Homocysteine: 14.8 umol/L (ref 4.0–15.4)

## 2012-06-16 NOTE — Progress Notes (Signed)
*  PRELIMINARY RESULTS* Vascular Ultrasound Lower extremity venous duplex has been completed.  Preliminary findings: Bilaterally negative for DVT.  Farrel Demark, RDMS, RVT  06/16/2012, 12:30 PM

## 2012-06-16 NOTE — Progress Notes (Signed)
5:48 AM    Heparin level down to 0.27 this am-slightly subtherapeutic. No bleeding noted. Increasing rate to 1350 and will f/u am labs 5/27   Janice Coffin

## 2012-06-16 NOTE — Progress Notes (Signed)
Patient ID: [name], male   DOB: 1929-02-26, 79 y.o.   MRN: [id number] YOM with Hx of DVT and admitted for Chest pain, with evaluation for IVC filter. He is currently on heparin. VQ scan done on 5/23 was indeterminate but improved c/w a prior study. He also has renal insufficiency. He has only had a few streaks of hemoptysis. Recommendations are to follow up after venous doppler study tomorrow. Can consider IVC filter if DVT study is positive. If negative, then consider a CTA chest to R/O PE. No IVC filter placement at this time.

## 2012-06-16 NOTE — Progress Notes (Signed)
Subjective:  Patient complains of right-sided pleuritic chest pain. States has been coughing streaks of blood. Denies any fever or chills. Denies any shortness of breath Objective:  Vital Signs in the last 24 hours: Temp:  [97.4 F (36.3 C)-98.9 F (37.2 C)] 98.1 F (36.7 C) (05/26 1009) Pulse Rate:  [61-79] 69 (05/26 1009) Resp:  [18-20] 18 (05/26 1009) BP: (132-159)/(55-76) 159/74 mmHg (05/26 1009) SpO2:  [94 %-98 %] 95 % (05/26 1009) Weight:  [75.3 kg (166 lb 0.1 oz)] 75.3 kg (166 lb 0.1 oz) (05/26 0636)  Intake/Output from previous day: 05/25 0701 - 05/26 0700 In: 1040.1 [P.O.:820; I.V.:220.1] Out: 1002 [Urine:1000; Stool:2] Intake/Output from this shift: Total I/O In: 180 [P.O.:180] Out: -   Physical Exam: Neck: no adenopathy, no carotid bruit, no JVD and supple, symmetrical, trachea midline Lungs: Decreased breath sound at bases Heart: regular rate and rhythm, S1, S2 normal and Soft systolic murmur noted Abdomen: soft, non-tender; bowel sounds normal; no masses,  no organomegaly Extremities: extremities normal, atraumatic, no cyanosis or edema  Lab Results:  Recent Labs  06/15/12 0510 06/16/12 0410  WBC 10.2 8.7  HGB 9.9* 9.5*  PLT 178 185    Recent Labs  06/14/12 0455 06/16/12 0410  NA 140 143  K 3.7 3.6  CL 106 106  CO2 24 26  GLUCOSE 145* 84  BUN 15 17  CREATININE 1.70* 1.60*    Recent Labs  06/13/12 1419 06/13/12 2014  TROPONINI <0.30 <0.30   Hepatic Function Panel No results found for this basename: PROT, ALBUMIN, AST, ALT, ALKPHOS, BILITOT, BILIDIR, IBILI,  in the last 72 hours  Recent Labs  06/14/12 0455  CHOL 115   No results found for this basename: PROTIME,  in the last 72 hours  Imaging: Imaging results have been reviewed and No results found.  Cardiac Studies:  Assessment/Plan:  Status post Atypical chest pain with elevated d-dimer rule out PE Probable recurrent pulmonary embolism Coronary artery disease history of  multiple PCI's in the past  Mild systolic heart failure  History of pulmonary embolism in the past  History of DVT in the past  History of paroxysmal atrial fibrillation in the past  Hypertension  Diabetes mellitus  Hypercholesteremia  Remote tobacco abuse  Chronic kidney disease stage III  Hypothyroidism  History of pancreatitis  GERD  Chronic abdominal pain Plan Check complete duplex venous ultrasound of lower extremities Patient may need IVC filter in view of recurrent PE and history of DVT and patient being not in good candidate for chronic anticoagulation Continue heparin for now  LOS: 3 days    Eli Adami N 06/16/2012, 10:38 AM

## 2012-06-17 LAB — LUPUS ANTICOAGULANT PANEL
DRVVT: 43.1 secs — ABNORMAL HIGH (ref <42.9)
Lupus Anticoagulant: DETECTED — AB
dRVVT Incubated 1:1 Mix: 37.2 secs (ref <42.9)

## 2012-06-17 LAB — PROTEIN S ACTIVITY: Protein S Activity: 70 % (ref 69–129)

## 2012-06-17 LAB — CBC
Hemoglobin: 9.9 g/dL — ABNORMAL LOW (ref 13.0–17.0)
MCHC: 34.4 g/dL (ref 30.0–36.0)
Platelets: 198 10*3/uL (ref 150–400)
RDW: 14.7 % (ref 11.5–15.5)

## 2012-06-17 LAB — GLUCOSE, CAPILLARY
Glucose-Capillary: 104 mg/dL — ABNORMAL HIGH (ref 70–99)
Glucose-Capillary: 153 mg/dL — ABNORMAL HIGH (ref 70–99)

## 2012-06-17 LAB — CARDIOLIPIN ANTIBODIES, IGG, IGM, IGA: Anticardiolipin IgA: 3 APL U/mL — ABNORMAL LOW (ref <22)

## 2012-06-17 LAB — HEPARIN LEVEL (UNFRACTIONATED): Heparin Unfractionated: 0.52 IU/mL (ref 0.30–0.70)

## 2012-06-17 LAB — BETA-2-GLYCOPROTEIN I ABS, IGG/M/A: Beta-2 Glyco I IgG: 0 G Units (ref <20)

## 2012-06-17 LAB — PROTEIN C ACTIVITY: Protein C Activity: 80 % (ref 75–133)

## 2012-06-17 LAB — PROTEIN S, TOTAL: Protein S Ag, Total: 74 % (ref 60–150)

## 2012-06-17 MED ORDER — PRAMOXINE-ZINC OXIDE IN MO 1-12.5 % RE OINT
TOPICAL_OINTMENT | Freq: Three times a day (TID) | RECTAL | Status: DC | PRN
  Administered 2012-06-21 (×3): via RECTAL
  Filled 2012-06-17 (×4): qty 28.3

## 2012-06-17 MED ORDER — SODIUM CHLORIDE 0.9 % IV SOLN
INTRAVENOUS | Status: DC | PRN
  Administered 2012-06-21: 22:00:00 via INTRAVENOUS

## 2012-06-17 NOTE — Progress Notes (Signed)
Subjective:  Patient denies any chest pain or shortness of breath continues to have streaks of bloody sputum states overall feels better. Duplex ultrasound of lower extremities was negative for DVT he did hypercoagulable workup is in progress. Will plan to hydrate him slowly today and if renal function appears improved will schedule for CT A of chest to rule out pulmonary embolism. We'll continue IV heparin for now.  Objective:  Vital Signs in the last 24 hours: Temp:  [97.9 F (36.6 C)-99.3 F (37.4 C)] 98.2 F (36.8 C) (05/27 0653) Pulse Rate:  [62-73] 62 (05/27 0813) Resp:  [18-20] 20 (05/27 0653) BP: (128-187)/(54-74) 132/54 mmHg (05/27 0813) SpO2:  [94 %-100 %] 94 % (05/27 0653) Weight:  [75.161 kg (165 lb 11.2 oz)] 75.161 kg (165 lb 11.2 oz) (05/27 0653)  Intake/Output from previous day: 05/26 0701 - 05/27 0700 In: 1654.4 [P.O.:1100; I.V.:554.4] Out: 950 [Urine:950] Intake/Output from this shift:    Physical Exam: Neck: no adenopathy, no carotid bruit, no JVD and supple, symmetrical, trachea midline Lungs: clear to auscultation bilaterally Heart: regular rate and rhythm, S1, S2 normal and Soft systolic murmur noted no S3 gallop Abdomen: soft, non-tender; bowel sounds normal; no masses,  no organomegaly Extremities: extremities normal, atraumatic, no cyanosis or edema  Lab Results:  Recent Labs  06/16/12 0410 06/17/12 0600  WBC 8.7 7.4  HGB 9.5* 9.9*  PLT 185 198    Recent Labs  06/16/12 0410  NA 143  K 3.6  CL 106  CO2 26  GLUCOSE 84  BUN 17  CREATININE 1.60*   No results found for this basename: TROPONINI, CK, MB,  in the last 72 hours Hepatic Function Panel No results found for this basename: PROT, ALBUMIN, AST, ALT, ALKPHOS, BILITOT, BILIDIR, IBILI,  in the last 72 hours No results found for this basename: CHOL,  in the last 72 hours No results found for this basename: PROTIME,  in the last 72 hours  Imaging: Imaging results have been reviewed and No  results found.  Cardiac Studies:  Assessment/Plan:  Status post Atypical chest pain with elevated d-dimer rule out PE  Probable recurrent pulmonary embolism  Coronary artery disease history of multiple PCI's in the past  Mild systolic heart failure  History of pulmonary embolism in the past  History of DVT in the past  History of paroxysmal atrial fibrillation in the past  Hypertension  Diabetes mellitus  Hypercholesteremia  Remote tobacco abuse  Chronic kidney disease stage III  Hypothyroidism  History of pancreatitis  GERD  Chronic abdominal pain Plan As per orders Schedule for CT A in a.m. if renal function stable  LOS: 4 days    Shameika Speelman N 06/17/2012, 8:15 AM

## 2012-06-17 NOTE — Progress Notes (Signed)
ANTICOAGULATION CONSULT NOTE - Follow Up Consult  Pharmacy Consult for Heparin Indication: r/o PE vs ACS  No Known Allergies  Labs:  Recent Labs  06/15/12 0510 06/16/12 0410 06/17/12 0600  HGB 9.9* 9.5* 9.9*  HCT 28.8* 27.6* 28.8*  PLT 178 185 198  HEPARINUNFRC 0.40 0.27* 0.52  CREATININE  --  1.60*  --     Estimated Creatinine Clearance: 35.6 ml/min (by C-G formula based on Cr of 1.6).  Assessment:  Heparin level remains therapeutic at 0.52.  Some streaks of bloody sputum, but overall states he is feeling better.  Goal of Therapy:  Heparin level 0.3-0.7 units/ml Monitor platelets by anticoagulation protocol: Yes   Plan:  Continue heparin at 1300 units/hr Daily heparin level  Thank you. Okey Regal, PharmD [phone]  06/17/2012 8:48 AM

## 2012-06-17 NOTE — Progress Notes (Signed)
PT Cancellation Note  Patient Details Name: [name] MRN: [id number] DOB: 1929/08/29   Cancelled Treatment:    Reason Eval/Treat Not Completed: Medical issues which prohibited therapy Per chart review, "streaks of bloody sputum" today, LE dopplers negative and CTA planned to r/o PE.  Will await results.    Kazuo Durnil,KATHrine E 06/17/2012, 1:55 PM Zenovia Jarred, PT, DPT 06/17/2012 Pager: [phone]

## 2012-06-18 ENCOUNTER — Encounter (HOSPITAL_COMMUNITY): Payer: Self-pay | Admitting: Radiology

## 2012-06-18 ENCOUNTER — Inpatient Hospital Stay (HOSPITAL_COMMUNITY): Payer: Medicare Other

## 2012-06-18 LAB — BASIC METABOLIC PANEL
CO2: 29 mEq/L (ref 19–32)
Chloride: 101 mEq/L (ref 96–112)
Creatinine, Ser: 1.48 mg/dL — ABNORMAL HIGH (ref 0.50–1.35)

## 2012-06-18 LAB — GLUCOSE, CAPILLARY
Glucose-Capillary: 152 mg/dL — ABNORMAL HIGH (ref 70–99)
Glucose-Capillary: 92 mg/dL (ref 70–99)
Glucose-Capillary: 123 mg/dL — ABNORMAL HIGH (ref 70–99)

## 2012-06-18 LAB — CBC
HCT: 29.1 % — ABNORMAL LOW (ref 39.0–52.0)
MCV: 76.8 fL — ABNORMAL LOW (ref 78.0–100.0)
Platelets: 217 10*3/uL (ref 150–400)
RBC: 3.79 MIL/uL — ABNORMAL LOW (ref 4.22–5.81)
WBC: 7.4 10*3/uL (ref 4.0–10.5)

## 2012-06-18 MED ORDER — PATIENT'S GUIDE TO USING COUMADIN BOOK
Freq: Once | Status: AC
  Administered 2012-06-18: 12:00:00
  Filled 2012-06-18: qty 1

## 2012-06-18 MED ORDER — WARFARIN VIDEO
Freq: Once | Status: AC
Start: 2012-06-18 — End: 2012-06-18
  Administered 2012-06-18: 12:00:00

## 2012-06-18 MED ORDER — WARFARIN - PHARMACIST DOSING INPATIENT
Freq: Every day | Status: DC
  Administered 2012-06-19 – 2012-06-24 (×3)

## 2012-06-18 MED ORDER — DEXTROSE 5 % IV SOLN
500.0000 mg | INTRAVENOUS | Status: DC
  Administered 2012-06-18: 500 mg via INTRAVENOUS
  Filled 2012-06-18 (×2): qty 500

## 2012-06-18 MED ORDER — IOHEXOL 350 MG/ML SOLN
100.0000 mL | Freq: Once | INTRAVENOUS | Status: AC | PRN
  Administered 2012-06-18: 100 mL via INTRAVENOUS

## 2012-06-18 MED ORDER — WARFARIN SODIUM 5 MG PO TABS
5.0000 mg | ORAL_TABLET | Freq: Every day | ORAL | Status: DC
  Administered 2012-06-18 – 2012-06-19 (×2): 5 mg via ORAL
  Filled 2012-06-18 (×3): qty 1

## 2012-06-18 MED ORDER — DEXTROSE 5 % IV SOLN
1.0000 g | INTRAVENOUS | Status: DC
  Administered 2012-06-18 – 2012-06-25 (×8): 1 g via INTRAVENOUS
  Filled 2012-06-18 (×9): qty 10

## 2012-06-18 NOTE — Care Management Note (Addendum)
  Page 1 of 1   06/18/2012     12:19:36 PM   CARE MANAGEMENT NOTE 06/18/2012  Patient:  [facility]   Account Number:  [id number]  Date Initiated:  06/17/2012  Documentation initiated by:  Oletta Cohn  Subjective/Objective Assessment:   79 year old male Atypical chest pain with elevated d-dimer rule out PE/rule out MI     Action/Plan:   rule out MI/   Anticipated DC Date:  06/20/2012   Anticipated DC Plan:  HOME W HOME HEALTH SERVICES      DC Planning Services  CM consult      Choice offered to / List presented to:          [facility] arranged  HH-1 RN  HH-10 DISEASE MANAGEMENT      HH agency  [facility]   Status of service:  Completed, signed off Medicare Important Message given?   (If response is "NO", the following Medicare IM given date fields will be blank) Date Medicare IM given:   Date Additional Medicare IM given:    Discharge Disposition:    Per UR Regulation:  Reviewed for med. necessity/level of care/duration of stay  If discussed at Long Length of Stay Meetings, dates discussed:    Comments:  06/18/12.Marland KitchenMarland KitchenOletta Cohn, RN, BSN, [employer] [phone] Spoke with pt regarding discharge planning.  Offered pt [employer] pf [employer].  Pt chose [facility] to render services of disease management of CHF and Physical Therapy.  Kizzie Furnish of [facility] notified.

## 2012-06-18 NOTE — Progress Notes (Signed)
Subjective:  Patient denies any chest pain complaints of cough with occasional streaks of blood. Discussed with patient regarding CT of the chest suggestive of bilateral pulmonary embolism and probable pneumonia and restarting with Coumadin its risk and benefits and agrees. We'll try to keep INR around 2.  Objective:  Vital Signs in the last 24 hours: Temp:  [98.2 F (36.8 C)-98.5 F (36.9 C)] 98.2 F (36.8 C) (05/28 0500) Pulse Rate:  [56-79] 63 (05/28 0743) Resp:  [18-20] 18 (05/28 0500) BP: (143-179)/(65-86) 143/70 mmHg (05/28 0743) SpO2:  [96 %-100 %] 96 % (05/28 0500) Weight:  [74.435 kg (164 lb 1.6 oz)] 74.435 kg (164 lb 1.6 oz) (05/28 0500)  Intake/Output from previous day: 05/27 0701 - 05/28 0700 In: 680 [P.O.:680] Out: 1775 [Urine:1775] Intake/Output from this shift: Total I/O In: 360 [P.O.:360] Out: 125 [Urine:125]  Physical Exam: Neck: no adenopathy, no carotid bruit, no JVD and supple, symmetrical, trachea midline Lungs: Decrease vessel at bases with occasional rhonchi Heart: regular rate and rhythm, S1, S2 normal and Soft systolic murmur noted no S3 gallop Abdomen: soft, non-tender; bowel sounds normal; no masses,  no organomegaly Extremities: extremities normal, atraumatic, no cyanosis or edema  Lab Results:  Recent Labs  06/17/12 0600 06/18/12 0350  WBC 7.4 7.4  HGB 9.9* 10.1*  PLT 198 217    Recent Labs  06/16/12 0410 06/18/12 0350  NA 143 140  K 3.6 3.5  CL 106 101  CO2 26 29  GLUCOSE 84 133*  BUN 17 21  CREATININE 1.60* 1.48*   No results found for this basename: TROPONINI, CK, MB,  in the last 72 hours Hepatic Function Panel No results found for this basename: PROT, ALBUMIN, AST, ALT, ALKPHOS, BILITOT, BILIDIR, IBILI,  in the last 72 hours No results found for this basename: CHOL,  in the last 72 hours No results found for this basename: PROTIME,  in the last 72 hours  Imaging: Imaging results have been reviewed and Ct Angio Chest Pe  W/cm &/or Wo Cm  06/18/2012   *RADIOLOGY REPORT*  Clinical Data: Concern for pulmonary embolus; pleuritic chest pain. Elevated D-dimer.  Intermediate probability V/Q scan five days ago.  CT ANGIOGRAPHY CHEST  Technique:  Multidetector CT imaging of the chest using the standard protocol during bolus administration of intravenous contrast. Multiplanar reconstructed images including MIPs were obtained and reviewed to evaluate the vascular anatomy.  Contrast: OMNIPAQUE IOHEXOL 350 MG/ML SOLN  Comparison: V/Q scan and chest radiograph performed 06/13/2012, and CTA of the chest performed 10/15/2008; CT of the abdomen and pelvis performed 06/06/2012  Findings: There is embolus within a pulmonary artery to a segment of the right lower lobe.  There is also a small amount of pulmonary embolus within a subsegmental pulmonary artery to the left lower lobe.  Bibasilar airspace opacities are seen, left greater than right, with a trace left pleural effusion.  Mild left basilar pneumonia cannot be excluded.  There is also minimal vague opacity at the medial aspect of the left upper lobe, possibly reflecting an infectious process.  Underlying pulmonary infarct may be present, but is not well characterized.  There is no evidence of pneumothorax.  Diffuse coronary artery calcifications are seen.  The mediastinum is otherwise unremarkable in appearance.  No mediastinal lymphadenopathy is seen.  No pericardial effusion is identified. The great vessels are grossly unremarkable in appearance. Scattered calcification is noted along the thoracic aorta.  No axillary lymphadenopathy is seen.  The thyroid gland is unremarkable in  appearance.  There is a somewhat unusual appearance to the portal venous system, though this may reflect mild periportal edema and is stable from the prior CT of the abdomen and pelvis. The visualized portions of the liver are otherwise grossly unremarkable.  Bilateral renal cysts are partially imaged.  The  visualized portions of the spleen are within normal limits.  Flattening of the IVC may reflect volume depletion.  No acute osseous abnormalities are seen.  IMPRESSION:  1.  Pulmonary embolus noted within a segmental pulmonary artery to the right lower lobe, and a small amount of pulmonary embolus seen within a subsegmental pulmonary artery to the left lower lobe. 2.  Bibasilar airspace opacities, left greater than right, with a trace left pleural effusion.  Mild left basilar pneumonia cannot be excluded.  Minimal vague opacity at the medial aspect of the left upper lobe may also reflect an infectious process. 3.  Diffuse coronary artery calcifications seen. 4.  Bilateral renal cysts noted. 5.  Flattening of the IVC may reflect volume depletion.  Critical Value/emergent results were called by telephone at the time of interpretation on 06/18/2012 at 03:45 a.m. to Dr. Rinaldo Cloud, who verbally acknowledged these results.   Original Report Authenticated By: Tonia Ghent, M.D.    Cardiac Studies:  Assessment/Plan:  Bilateral recurrent pulmonary embolism  Bilateral pneumonia Coronary artery disease history of multiple PCI's in the past  Mild systolic heart failure  History of pulmonary embolism in the past  History of DVT in the past  History of paroxysmal atrial fibrillation in the past  Hypertension  Diabetes mellitus  Hypercholesteremia  Remote tobacco abuse  Chronic kidney disease stage III  Hypothyroidism  History of pancreatitis  Plan As per orders  LOS: 5 days    [name] N 06/18/2012, 11:39 AM

## 2012-06-18 NOTE — Progress Notes (Signed)
PT Cancellation Note  Patient Details Name: [name] MRN: [id number] DOB: 07-09-1929   Cancelled Treatment:    Reason Eval/Treat Not Completed: Medical issues which prohibited therapy.Bil. PE's   Rada Hay 06/18/2012, 11:18 AM

## 2012-06-18 NOTE — Evaluation (Signed)
Occupational Therapy Evaluation Patient Details Name: [name] MRN: [id number] DOB: July 15, 1929 Today's Date: 06/18/2012 Time: 4098-1191 OT Time Calculation (min): 35 min  OT Assessment / Plan / Recommendation Clinical Impression  This 79 yo male admiited with chest pain and found to have Bil PE and PNA presents to acute OT with problems below. Will benefit from acute OT without need for follow up.    OT Assessment  Patient needs continued OT Services    Follow Up Recommendations  No OT follow up    Barriers to Discharge None    Equipment Recommendations  None recommended by OT       Frequency  Min 2X/week    Precautions / Restrictions Precautions Precautions: None Restrictions Weight Bearing Restrictions: No   Pertinent Vitals/Pain Pointed to right chest, but not rated when asked about pain    ADL  Eating/Feeding: Simulated;Independent Where Assessed - Eating/Feeding: Edge of bed Grooming: Performed;Wash/dry hands;Min guard Where Assessed - Grooming: Unsupported standing Upper Body Bathing: Simulated;Set up Where Assessed - Upper Body Bathing: Unsupported sitting Lower Body Bathing: Simulated;Min guard Where Assessed - Lower Body Bathing: Unsupported sit to stand Upper Body Dressing: Simulated;Set up Where Assessed - Upper Body Dressing: Unsupported sitting Lower Body Dressing: Simulated;Min guard Where Assessed - Lower Body Dressing: Unsupported sit to stand Toilet Transfer: Performed;Min guard Toilet Transfer Method: Sit to Barista: Regular height toilet;Grab bars Toileting - Clothing Manipulation and Hygiene: Performed;Min guard Where Assessed - Engineer, mining and Hygiene: Standing Equipment Used: Rolling walker;Gait belt (quad cane) Transfers/Ambulation Related to ADLs: Min guard A for all    OT Diagnosis: Generalized weakness;Acute pain  OT Problem List: Decreased strength;Impaired balance (sitting and/or  standing);Decreased activity tolerance;Pain OT Treatment Interventions: Self-care/ADL training;Balance training;Patient/family education;DME and/or AE instruction;Therapeutic activities   OT Goals Acute Rehab OT Goals OT Goal Formulation: With patient Time For Goal Achievement: 06/25/12 Potential to Achieve Goals: Good ADL Goals Pt Will Perform Grooming: with modified independence;Unsupported;Standing at sink ADL Goal: Grooming - Progress: Goal set today Pt Will Perform Upper Body Bathing: with modified independence;Unsupported;Standing at sink;Sitting at sink ADL Goal: Upper Body Bathing - Progress: Goal set today Pt Will Perform Lower Body Bathing: with modified independence;Unsupported;Sit to stand from chair;Sit to stand from bed ADL Goal: Lower Body Bathing - Progress: Goal set today Pt Will Transfer to Toilet: with modified independence;with DME;Regular height toilet;Grab bars ADL Goal: Toilet Transfer - Progress: Goal set today Pt Will Perform Toileting - Clothing Manipulation: Independently;Standing ADL Goal: Toileting - Clothing Manipulation - Progress: Goal set today Pt Will Perform Toileting - Hygiene: Independently;Sit to stand from 3-in-1/toilet ADL Goal: Toileting - Hygiene - Progress: Goal set today Pt Will Perform Tub/Shower Transfer: Shower transfer;with modified independence;Ambulation;with DME;Grab bars;Shower seat with back ADL Goal: Tub/Shower Transfer - Progress: Goal set today  Visit Information  Last OT Received On: 06/18/12 Assistance Needed: +1 PT/OT Co-Evaluation/Treatment: Yes    Subjective Data  Subjective: I have PNA and a blood clot in my right lung   Prior Functioning     Home Living Lives With: Alone Available Help at Discharge: Friend(s) Type of Home: Independent living facility Home Access: Level entry Home Layout: One level Bathroom Shower/Tub: Health visitor: Handicapped height Home Adaptive Equipment: Walker -  rolling;Quad cane;Built-in shower seat;Grab bars in shower;Grab bars around toilet Additional Comments: assistant pastor helps with groceries and some meals. Prior Function Level of Independence: Needs assistance Needs Assistance: Meal Prep Meal Prep: Moderate Able to Take Stairs?: Yes  Driving:  (very little) Vocation: Retired Musician: HOH Dominant Hand: Right         Vision/Perception Vision - History Baseline Vision: Wears glasses all the time Patient Visual Report: No change from baseline   Cognition  Cognition Arousal/Alertness: Awake/alert Behavior During Therapy: WFL for tasks assessed/performed Overall Cognitive Status: Within Functional Limits for tasks assessed    Extremity/Trunk Assessment Right Upper Extremity Assessment RUE ROM/Strength/Tone: Within functional levels Left Upper Extremity Assessment LUE ROM/Strength/Tone: Within functional levels     Mobility Bed Mobility Bed Mobility: Supine to Sit;Sitting - Scoot to Edge of Bed Supine to Sit: 6: Modified independent (Device/Increase time);HOB flat Sitting - Scoot to Edge of Bed: 6: Modified independent (Device/Increase time) Transfers Transfers: Sit to Stand;Stand to Sit Sit to Stand: 4: Min guard;With upper extremity assist;From bed Stand to Sit: 4: Min guard;With upper extremity assist;To chair/3-in-1           End of Session OT - End of Session Equipment Utilized During Treatment: Gait belt Activity Tolerance: Patient tolerated treatment well Patient left: in chair;with call bell/phone within reach       Evette Georges [phone] 06/18/2012, 2:56 PM

## 2012-06-18 NOTE — Evaluation (Deleted)
Physical Therapy Evaluation Patient Details Name: [name] MRN: [id number] DOB: [birth year] Today's Date: 06/18/2012 Time: 1478-2956 PT Time Calculation (min): 28 min  PT Assessment / Plan / Recommendation Clinical Impression  79 yo male admitted 06/13/12 with chest pain. Pt found to have bil. PE's. Pt has h/o same. Pt lives alone in independent living facility. Pt reports assistance from friends with transportation, groceries, otherwise is independent in apt. Pt ambulated in room to Bathroom, does well with cane and cruising. Pt will benefit from PT while inacute care. Recommend HHPT after discharge.   If further close monitoring of medication and dx is needed, SNF would be an option(not discussed with pt.)    PT Assessment  Patient needs continued PT services    Follow Up Recommendations   (if pt needs further assistance with medication, SNF would be)    Does the patient have the potential to tolerate intense rehabilitation      Barriers to Discharge Decreased caregiver support      Equipment Recommendations  None recommended by PT    Recommendations for Other Services     Frequency Min 3X/week    Precautions / Restrictions Precautions Precautions: None Restrictions Weight Bearing Restrictions: No   Pertinent Vitals/Pain c/o little R chest pain as has been ongoing.     Mobility  Bed Mobility Bed Mobility: Supine to Sit;Sitting - Scoot to Edge of Bed Supine to Sit: 6: Modified independent (Device/Increase time);HOB flat Sitting - Scoot to Edge of Bed: 6: Modified independent (Device/Increase time) Transfers Sit to Stand: 4: Min guard;With upper extremity assist;From bed Stand to Sit: 4: Min guard;With upper extremity assist;To chair/3-in-1    Exercises     PT Diagnosis: Generalized weakness;Acute pain  PT Problem List: Decreased strength;Decreased activity tolerance;Decreased mobility;Pain;Decreased knowledge of use of DME PT Treatment Interventions: DME  instruction;Gait training;Functional mobility training;Therapeutic activities;Therapeutic exercise;Patient/family education   PT Goals Acute Rehab PT Goals PT Goal Formulation: With patient Time For Goal Achievement: 07/02/12 Potential to Achieve Goals: Good Pt will go Sit to Stand: with modified independence PT Goal: Sit to Stand - Progress: Goal set today Pt will go Stand to Sit: with modified independence PT Goal: Stand to Sit - Progress: Goal set today Pt will Ambulate: 51 - 150 feet;with modified independence;with least restrictive assistive device PT Goal: Ambulate - Progress: Goal set today Pt will Perform Home Exercise Program: Independently PT Goal: Perform Home Exercise Program - Progress: Goal set today  Visit Information  Last PT Received On: 06/18/12 Assistance Needed: +1 Reason Eval/Treat Not Completed: Medical issues which prohibited therapy    Subjective Data      Prior Functioning  Home Living Lives With: Alone Available Help at Discharge: Friend(s) Type of Home: Independent living facility Home Access: Level entry Home Layout: One level Bathroom Shower/Tub: Health visitor: Handicapped height Home Adaptive Equipment: Walker - rolling;Quad cane;Built-in shower seat;Grab bars in shower;Grab bars around toilet Additional Comments: assistant pastor helps with groceries and some meals. 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

## 2012-06-18 NOTE — Evaluation (Signed)
Physical Therapy Evaluation Patient Details Name: [name] MRN: [id number] DOB: 01/23/1930 Today's Date: 06/18/2012 Time: 5621-3086 PT Time Calculation (min): 28 min  PT Assessment / Plan / Recommendation Clinical Impression  79 yo male admitted 06/13/12 with chest pain. Pt found to have bil. PE's. Pt has h/o same. Pt lives alone in independent living facility. Pt reports assistance from friends with transportation, groceries, otherwise is independent in apt. Pt ambulated in room to Bathroom, does well with cane and cruising. Pt will benefit from PT while inacute care. Recommend HHPT after discharge.      PT Assessment  Patient needs continued PT services    Follow Up Recommendations   (if pt needs further assistance with medication, SNF would be)    Does the patient have the potential to tolerate intense rehabilitation      Barriers to Discharge Decreased caregiver support      Equipment Recommendations  None recommended by PT    Recommendations for Other Services     Frequency Min 3X/week    Precautions / Restrictions Precautions Precautions: None Restrictions Weight Bearing Restrictions: No   Pertinent Vitals/Pain Mild r chest pain.      Mobility  Bed Mobility Bed Mobility: Supine to Sit;Sitting - Scoot to Edge of Bed Supine to Sit: 6: Modified independent (Device/Increase time);HOB flat Sitting - Scoot to Edge of Bed: 6: Modified independent (Device/Increase time) Transfers Sit to Stand: 4: Min guard;With upper extremity assist;From bed Stand to Sit: 4: Min guard;With upper extremity assist;To chair/3-in-1 Ambulation/Gait Ambulation/Gait Assistance: 5: Supervision;4: Min guard Ambulation Distance (Feet): 20 Feet (x2) Assistive device: Rolling walker Ambulation/Gait Assistance Details: from bathroom no AD, held objects Gait Pattern: Step-through pattern    Exercises     PT Diagnosis: Generalized weakness;Acute pain  PT Problem List: Decreased  strength;Decreased activity tolerance;Decreased mobility;Pain;Decreased knowledge of use of DME PT Treatment Interventions: DME instruction;Gait training;Functional mobility training;Therapeutic activities;Therapeutic exercise;Patient/family education   PT Goals Acute Rehab PT Goals PT Goal Formulation: With patient Time For Goal Achievement: 07/02/12 Potential to Achieve Goals: Good Pt will go Sit to Stand: with modified independence PT Goal: Sit to Stand - Progress: Goal set today Pt will go Stand to Sit: with modified independence PT Goal: Stand to Sit - Progress: Goal set today Pt will Ambulate: 51 - 150 feet;with modified independence;with least restrictive assistive device PT Goal: Ambulate - Progress: Goal set today Pt will Perform Home Exercise Program: Independently PT Goal: Perform Home Exercise Program - Progress: Goal set today  Visit Information  Last PT Received On: 06/18/12 Assistance Needed: +1 Reason Eval/Treat Not Completed: Medical issues which prohibited therapy    Subjective Data      Prior Functioning  Home Living Lives With: Alone Available Help at Discharge: Friend(s) Type of Home: Independent living facility Home Access: Level entry Home Layout: One level Bathroom Shower/Tub: Health visitor: Handicapped height Home Adaptive Equipment: Walker - rolling;Quad cane;Built-in shower seat;Grab bars in shower;Grab bars around toilet Additional Comments: assistant pastor helps with groceries and some meals. 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    Cognition  Cognition Arousal/Alertness: Awake/alert Behavior During Therapy: WFL for tasks assessed/performed Overall Cognitive Status: Within Functional Limits for tasks assessed    Extremity/Trunk  Assessment Right Upper Extremity Assessment RUE ROM/Strength/Tone: Within functional levels Left Upper Extremity Assessment LUE ROM/Strength/Tone: Within functional levels Right  Lower Extremity Assessment RLE ROM/Strength/Tone: [facility] for tasks assessed Left Lower Extremity Assessment LLE ROM/Strength/Tone: [facility] for tasks assessed   Balance    End of Session  left in recliner with call bell.  GP     Rada Hay 06/18/2012, 3:09 PM

## 2012-06-18 NOTE — Progress Notes (Addendum)
ANTICOAGULATION CONSULT NOTE - Follow Up Consult  Pharmacy Consult for Heparin / Coumadin Indication: r/o PE vs ACS  No Known Allergies  Labs:  Recent Labs  06/16/12 0410 06/17/12 0600 06/18/12 0350  HGB 9.5* 9.9* 10.1*  HCT 27.6* 28.8* 29.1*  PLT 185 198 217  HEPARINUNFRC 0.27* 0.52 0.42  CREATININE 1.60*  --  1.48*    Estimated Creatinine Clearance: 38.5 ml/min (by C-G formula based on Cr of 1.48).  Assessment:  Heparin level remains therapeutic at 0.42.  Some streaks of bloody sputum, but overall states he is feeling better.  CTA positive for new PE  Starting Coumadin today   Goal of Therapy:  Heparin level 0.3-0.7 units/ml Monitor platelets by anticoagulation protocol: Yes   Plan:  Continue heparin at 1300 units/hr Coumadin 5 mg po daily at 1800 pm Daily heparin level, CBC, INR  Thank you. Okey Regal, PharmD [phone]  06/18/2012 10:05 AM

## 2012-06-19 LAB — BASIC METABOLIC PANEL
BUN: 26 mg/dL — ABNORMAL HIGH (ref 6–23)
Chloride: 104 mEq/L (ref 96–112)
Glucose, Bld: 117 mg/dL — ABNORMAL HIGH (ref 70–99)
Potassium: 3.7 mEq/L (ref 3.5–5.1)
Sodium: 142 mEq/L (ref 135–145)

## 2012-06-19 LAB — CBC
MCH: 26.8 pg (ref 26.0–34.0)
MCHC: 34.2 g/dL (ref 30.0–36.0)
MCV: 78.3 fL (ref 78.0–100.0)
Platelets: 249 10*3/uL (ref 150–400)
RDW: 15 % (ref 11.5–15.5)
WBC: 7.4 10*3/uL (ref 4.0–10.5)

## 2012-06-19 LAB — GLUCOSE, CAPILLARY
Glucose-Capillary: 116 mg/dL — ABNORMAL HIGH (ref 70–99)
Glucose-Capillary: 156 mg/dL — ABNORMAL HIGH (ref 70–99)
Glucose-Capillary: 135 mg/dL — ABNORMAL HIGH (ref 70–99)

## 2012-06-19 LAB — PROTIME-INR: INR: 1.16 (ref 0.00–1.49)

## 2012-06-19 MED ORDER — AZITHROMYCIN 500 MG PO TABS
500.0000 mg | ORAL_TABLET | Freq: Every day | ORAL | Status: DC
  Administered 2012-06-19 – 2012-06-25 (×7): 500 mg via ORAL
  Filled 2012-06-19 (×8): qty 1

## 2012-06-19 MED ORDER — SENNOSIDES-DOCUSATE SODIUM 8.6-50 MG PO TABS
1.0000 | ORAL_TABLET | Freq: Two times a day (BID) | ORAL | Status: DC | PRN
  Administered 2012-06-22 – 2012-06-25 (×4): 1 via ORAL
  Filled 2012-06-19 (×4): qty 1

## 2012-06-19 MED ORDER — FERROUS SULFATE 325 (65 FE) MG PO TABS
325.0000 mg | ORAL_TABLET | Freq: Three times a day (TID) | ORAL | Status: DC
  Administered 2012-06-19 – 2012-06-25 (×19): 325 mg via ORAL
  Filled 2012-06-19 (×21): qty 1

## 2012-06-19 NOTE — Progress Notes (Signed)
ANTICOAGULATION CONSULT NOTE - Follow Up Consult  Pharmacy Consult for Heparin / Coumadin Indication: r/o PE vs ACS  No Known Allergies  Labs:  Recent Labs  06/17/12 0600 06/18/12 0350 06/19/12 0410  HGB 9.9* 10.1* 8.9*  HCT 28.8* 29.1* 26.0*  PLT 198 217 249  LABPROT  --   --  14.6  INR  --   --  1.16  HEPARINUNFRC 0.52 0.42 0.67  CREATININE  --  1.48* 2.08*    Estimated Creatinine Clearance: 27.4 ml/min (by C-G formula based on Cr of 2.08).  Assessment:  Heparin level remains therapeutic at 0.67.   CTA positive for new PE INR = 1.16, started Coumadin 5/28, hx of GIB  Goal of Therapy:  Heparin level 0.3-0.7 units/ml Monitor platelets by anticoagulation protocol: Yes INR goal is 2   Plan:  Continue heparin at 1300 units/hr Continue Coumadin 5 mg po daily at 1800 pm Daily heparin level, CBC, INR  Thank you. Okey Regal, PharmD [phone]  06/19/2012 9:06 AM

## 2012-06-19 NOTE — Progress Notes (Signed)
Subjective:  Patient complains of vague pleuritic right-sided chest pain. No anginal chest pain no further hemoptysis. Also complains of constipation.  Objective:  Vital Signs in the last 24 hours: Temp:  [98 F (36.7 C)-98.3 F (36.8 C)] 98 F (36.7 C) (05/29 0607) Pulse Rate:  [51-65] 51 (05/29 0607) Resp:  [18-19] 18 (05/29 0607) BP: (101-134)/(41-72) 128/72 mmHg (05/29 0607) SpO2:  [100 %] 100 % (05/29 0607) Weight:  [74.934 kg (165 lb 3.2 oz)] 74.934 kg (165 lb 3.2 oz) (05/29 0607)  Intake/Output from previous day: 05/28 0701 - 05/29 0700 In: 780 [P.O.:780] Out: 1050 [Urine:1050] Intake/Output from this shift:    Physical Exam: Neck: no adenopathy, no carotid bruit, no JVD and supple, symmetrical, trachea midline Lungs: Decreased breath sound at bases occasional rhonchi Heart: regular rate and rhythm, S1, S2 normal and Soft systolic murmur noted Abdomen: soft, non-tender; bowel sounds normal; no masses,  no organomegaly Extremities: extremities normal, atraumatic, no cyanosis or edema  Lab Results:  Recent Labs  06/18/12 0350 06/19/12 0410  WBC 7.4 7.4  HGB 10.1* 8.9*  PLT 217 249    Recent Labs  06/18/12 0350 06/19/12 0410  NA 140 142  K 3.5 3.7  CL 101 104  CO2 29 30  GLUCOSE 133* 117*  BUN 21 26*  CREATININE 1.48* 2.08*   No results found for this basename: TROPONINI, CK, MB,  in the last 72 hours Hepatic Function Panel No results found for this basename: PROT, ALBUMIN, AST, ALT, ALKPHOS, BILITOT, BILIDIR, IBILI,  in the last 72 hours No results found for this basename: CHOL,  in the last 72 hours No results found for this basename: PROTIME,  in the last 72 hours  Imaging: Imaging results have been reviewed and Ct Angio Chest Pe W/cm &/or Wo Cm  06/18/2012   *RADIOLOGY REPORT*  Clinical Data: Concern for pulmonary embolus; pleuritic chest pain. Elevated D-dimer.  Intermediate probability V/Q scan five days ago.  CT ANGIOGRAPHY CHEST  Technique:   Multidetector CT imaging of the chest using the standard protocol during bolus administration of intravenous contrast. Multiplanar reconstructed images including MIPs were obtained and reviewed to evaluate the vascular anatomy.  Contrast: OMNIPAQUE IOHEXOL 350 MG/ML SOLN  Comparison: V/Q scan and chest radiograph performed 06/13/2012, and CTA of the chest performed 10/15/2008; CT of the abdomen and pelvis performed 06/06/2012  Findings: There is embolus within a pulmonary artery to a segment of the right lower lobe.  There is also a small amount of pulmonary embolus within a subsegmental pulmonary artery to the left lower lobe.  Bibasilar airspace opacities are seen, left greater than right, with a trace left pleural effusion.  Mild left basilar pneumonia cannot be excluded.  There is also minimal vague opacity at the medial aspect of the left upper lobe, possibly reflecting an infectious process.  Underlying pulmonary infarct may be present, but is not well characterized.  There is no evidence of pneumothorax.  Diffuse coronary artery calcifications are seen.  The mediastinum is otherwise unremarkable in appearance.  No mediastinal lymphadenopathy is seen.  No pericardial effusion is identified. The great vessels are grossly unremarkable in appearance. Scattered calcification is noted along the thoracic aorta.  No axillary lymphadenopathy is seen.  The thyroid gland is unremarkable in appearance.  There is a somewhat unusual appearance to the portal venous system, though this may reflect mild periportal edema and is stable from the prior CT of the abdomen and pelvis. The visualized portions of the  liver are otherwise grossly unremarkable.  Bilateral renal cysts are partially imaged.  The visualized portions of the spleen are within normal limits.  Flattening of the IVC may reflect volume depletion.  No acute osseous abnormalities are seen.  IMPRESSION:  1.  Pulmonary embolus noted within a segmental pulmonary  artery to the right lower lobe, and a small amount of pulmonary embolus seen within a subsegmental pulmonary artery to the left lower lobe. 2.  Bibasilar airspace opacities, left greater than right, with a trace left pleural effusion.  Mild left basilar pneumonia cannot be excluded.  Minimal vague opacity at the medial aspect of the left upper lobe may also reflect an infectious process. 3.  Diffuse coronary artery calcifications seen. 4.  Bilateral renal cysts noted. 5.  Flattening of the IVC may reflect volume depletion.  Critical Value/emergent results were called by telephone at the time of interpretation on 06/18/2012 at 03:45 a.m. to Dr. Rinaldo Cloud, who verbally acknowledged these results.   Original Report Authenticated By: Tonia Ghent, M.D.    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  LOS: 6 days    [name] N 06/19/2012, 10:28 AM

## 2012-06-19 NOTE — Progress Notes (Signed)
Occupational Therapy Treatment Patient Details Name: Kiril Hippe MRN: [id number] DOB: [birth year] Today's Date: 06/19/2012 Time: 4098-1191 OT Time Calculation (min): 23 min  OT Assessment / Plan / Recommendation Comments on Treatment Session This 79 yo male making excellent progress.    Follow Up Recommendations  No OT follow up       Equipment Recommendations  None recommended by OT       Frequency Min 2X/week   Plan Discharge plan remains appropriate    Precautions / Restrictions Precautions Precautions: None Restrictions Weight Bearing Restrictions: No   Pertinent Vitals/Pain 2 liters O2 99%, HR 58; RA O2 94%, HR 67; left on 1 liter    ADL  Grooming: Performed;Modified independent;Teeth care;Brushing hair Where Assessed - Grooming: Unsupported standing Lower Body Dressing: Modified independent;Performed Where Assessed - Lower Body Dressing: Unsupported sit to stand Toilet Transfer: Performed;Modified independent Toilet Transfer Method: Sit to Barista: Regular height toilet;Grab bars Toileting - Clothing Manipulation and Hygiene: Performed;Independent Where Assessed - Toileting Clothing Manipulation and Hygiene: Sit to stand from 3-in-1 or toilet Transfers/Ambulation Related to ADLs: Independent sit to stand and stand to sit, Mod I with ambulation in room pushing IV pole.      OT Goals ADL Goals Pt Will Perform Grooming: with modified independence;Unsupported;Standing at sink ADL Goal: Grooming - Progress: Met Pt Will Perform Upper Body Bathing: with modified independence;Unsupported;Standing at sink;Sitting at sink Pt Will Perform Lower Body Bathing: with modified independence;Unsupported;Sit to stand from chair;Sit to stand from bed Pt Will Transfer to Toilet: with modified independence;with DME;Regular height toilet;Grab bars ADL Goal: Toilet Transfer - Progress: Met Pt Will Perform Toileting - Clothing Manipulation:  Independently;Standing ADL Goal: Toileting - Clothing Manipulation - Progress: Met Pt Will Perform Toileting - Hygiene: Independently;Sit to stand from 3-in-1/toilet ADL Goal: Toileting - Hygiene - Progress: Met Pt Will Perform Tub/Shower Transfer: Shower transfer;with modified independence;Ambulation;with DME;Grab bars;Shower seat with back  Visit Information  Last OT Received On: 06/19/12 Assistance Needed: +1          Cognition  Cognition Arousal/Alertness: Awake/alert Behavior During Therapy: WFL for tasks assessed/performed Overall Cognitive Status: Within Functional Limits for tasks assessed    Mobility  Bed Mobility Details for Bed Mobility Assistance: Pt in recliner upon arrival Transfers Transfers: Sit to Stand;Stand to Sit Sit to Stand: 7: Independent;With upper extremity assist;With armrests;From chair/3-in-1 Stand to Sit: 7: Independent;With upper extremity assist;With armrests;To chair/3-in-1          End of Session OT - End of Session Activity Tolerance: Patient tolerated treatment well Patient left: in chair;with call bell/phone within reach Nurse Communication:  (O2 turned down to 1 liter (sats 94% on RA))       Evette Georges  [phone]  06/19/2012, 2:47 PM

## 2012-06-20 LAB — BASIC METABOLIC PANEL
BUN: 26 mg/dL — ABNORMAL HIGH (ref 6–23)
CO2: 30 mEq/L (ref 19–32)
GFR calc non Af Amer: 32 mL/min — ABNORMAL LOW (ref >90)
Glucose, Bld: 142 mg/dL — ABNORMAL HIGH (ref 70–99)
Potassium: 3.7 mEq/L (ref 3.5–5.1)
Sodium: 144 mEq/L (ref 135–145)

## 2012-06-20 LAB — GLUCOSE, CAPILLARY
Glucose-Capillary: 128 mg/dL — ABNORMAL HIGH (ref 70–99)
Glucose-Capillary: 116 mg/dL — ABNORMAL HIGH (ref 70–99)
Glucose-Capillary: 93 mg/dL (ref 70–99)

## 2012-06-20 LAB — CBC
HCT: 28.9 % — ABNORMAL LOW (ref 39.0–52.0)
MCH: 26.6 pg (ref 26.0–34.0)
MCV: 78.3 fL (ref 78.0–100.0)
Platelets: 252 10*3/uL (ref 150–400)
RBC: 3.69 MIL/uL — ABNORMAL LOW (ref 4.22–5.81)
RDW: 15 % (ref 11.5–15.5)
WBC: 6.8 10*3/uL (ref 4.0–10.5)

## 2012-06-20 LAB — HEPARIN LEVEL (UNFRACTIONATED): Heparin Unfractionated: 0.85 IU/mL — ABNORMAL HIGH (ref 0.30–0.70)

## 2012-06-20 MED ORDER — HEPARIN (PORCINE) IN NACL 100-0.45 UNIT/ML-% IJ SOLN
950.0000 [IU]/h | INTRAMUSCULAR | Status: DC
  Administered 2012-06-20: 1100 [IU]/h via INTRAVENOUS
  Administered 2012-06-21 – 2012-06-22 (×3): 1050 [IU]/h via INTRAVENOUS
  Administered 2012-06-23 – 2012-06-24 (×2): 950 [IU]/h via INTRAVENOUS
  Filled 2012-06-20 (×7): qty 250

## 2012-06-20 MED ORDER — HEPARIN (PORCINE) IN NACL 100-0.45 UNIT/ML-% IJ SOLN
1250.0000 [IU]/h | INTRAMUSCULAR | Status: DC
  Administered 2012-06-20: 1250 [IU]/h via INTRAVENOUS
  Filled 2012-06-20 (×2): qty 250

## 2012-06-20 MED ORDER — WARFARIN SODIUM 5 MG PO TABS
5.0000 mg | ORAL_TABLET | Freq: Once | ORAL | Status: AC
  Administered 2012-06-20: 5 mg via ORAL
  Filled 2012-06-20: qty 1

## 2012-06-20 NOTE — Progress Notes (Signed)
Subjective:  Patient denies any chest pain states he is breathing better now no further hemoptysis hemoglobin remained stable tolerating Coumadin okay  Objective:  Vital Signs in the last 24 hours: Temp:  [97.6 F (36.4 C)-98.2 F (36.8 C)] 97.6 F (36.4 C) (05/30 0556) Pulse Rate:  [58-63] 63 (05/30 1007) Resp:  [16-18] 16 (05/30 0556) BP: (120-175)/(60-74) 160/74 mmHg (05/30 1007) SpO2:  [97 %-100 %] 97 % (05/30 1048) Weight:  [75.887 kg (167 lb 4.8 oz)] 75.887 kg (167 lb 4.8 oz) (05/30 0556)  Intake/Output from previous day: 05/29 0701 - 05/30 0700 In: 1280 [P.O.:1180; IV Piggyback:100] Out: 950 [Urine:950] Intake/Output from this shift: Total I/O In: 240 [P.O.:240] Out: 1 [Stool:1]  Physical Exam: Neck: no adenopathy, no carotid bruit, no JVD and supple, symmetrical, trachea midline Lungs: Decreased breath sound at bases Heart: regular rate and rhythm, S1, S2 normal, no murmur, click, rub or gallop Abdomen: soft, non-tender; bowel sounds normal; no masses,  no organomegaly Extremities: extremities normal, atraumatic, no cyanosis or edema  Lab Results:  Recent Labs  06/19/12 0410 06/20/12 0615  WBC 7.4 6.8  HGB 8.9* 9.8*  PLT 249 252    Recent Labs  06/19/12 0410 06/20/12 0615  NA 142 144  K 3.7 3.7  CL 104 106  CO2 30 30  GLUCOSE 117* 142*  BUN 26* 26*  CREATININE 2.08* 1.87*   No results found for this basename: TROPONINI, CK, MB,  in the last 72 hours Hepatic Function Panel No results found for this basename: PROT, ALBUMIN, AST, ALT, ALKPHOS, BILITOT, BILIDIR, IBILI,  in the last 72 hours No results found for this basename: CHOL,  in the last 72 hours No results found for this basename: PROTIME,  in the last 72 hours  Imaging: Imaging results have been reviewed and No results found.  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  LOS: 7 days    Paola Flynt N 06/20/2012, 12:40 PM

## 2012-06-20 NOTE — Progress Notes (Signed)
ANTICOAGULATION CONSULT NOTE - Follow Up Consult  Pharmacy Consult for Heparin / Coumadin Indication: PE  No Known Allergies  Labs:  Recent Labs  06/18/12 0350 06/19/12 0410 06/20/12 0615  HGB 10.1* 8.9* 9.8*  HCT 29.1* 26.0* 28.9*  PLT 217 249 252  LABPROT  --  14.6 14.9  INR  --  1.16 1.19  HEPARINUNFRC 0.42 0.67 0.82*  CREATININE 1.48* 2.08* 1.87*    Estimated Creatinine Clearance: 30.5 ml/min (by C-G formula based on Cr of 1.87).  Assessment:  Heparin level supratherapeutic this AM (0.82) CTA positive for new PE INR = 1.19, started Coumadin 5/28, hx of GIB Pt is also on amiodarone  Goal of Therapy:  Heparin level 0.3-0.7 units/ml Monitor platelets by anticoagulation protocol: Yes INR goal is 2   Plan:  Decrease heparin drip to 1250 units/hr F/u with 8 hr heparin level Coumadin 5mg  PO x1

## 2012-06-20 NOTE — Progress Notes (Signed)
Physical Therapy Treatment Patient Details Name: [name] MRN: [id number] DOB: April 06, 1929 Today's Date: 06/20/2012 Time: 7829-5621 PT Time Calculation (min): 28 min  PT Assessment / Plan / Recommendation Comments on Treatment Session  Pt is making excellent progress with mobility. Feel pt will be safe to d/c back to independent ALF with continued PT services. Will continue to follow for activity tolerance and high level balance activities. O2 sats remained 98% on RA with gait.    Follow Up Recommendations  Home health PT     Does the patient have the potential to tolerate intense rehabilitation     Barriers to Discharge        Equipment Recommendations  None recommended by PT    Recommendations for Other Services    Frequency     Plan Discharge plan remains appropriate    Precautions / Restrictions Precautions Precautions: Fall Restrictions Weight Bearing Restrictions: No   Pertinent Vitals/Pain     Mobility  Bed Mobility Supine to Sit: 6: Modified independent (Device/Increase time);HOB elevated;With rails Sitting - Scoot to Edge of Bed: 6: Modified independent (Device/Increase time) Transfers Transfers: Sit to Stand;Stand to [employer] Transfers Sit to Stand: 5: Supervision;With upper extremity assist Stand to Sit: 5: Supervision;With upper extremity assist Stand Pivot Transfers: Other (comment);5: Supervision (to [facility]) Details for Transfer Assistance: pt stabolizes self by pushing back against the surface he is standing from. Pt requires cues to increase forward trunk flexion for improved balance over BOS. Ambulation/Gait Ambulation/Gait Assistance: 5: Supervision Ambulation Distance (Feet): 175 Feet Assistive device: None Ambulation/Gait Assistance Details: cues to increase stride length,  Gait Pattern: Step-through pattern;Decreased stride length Gait velocity: a little decreased General Gait Details: requires supervision on uneven surfaces and when  challenged.  Stairs: No    Exercises     PT Diagnosis:    PT Problem List:   PT Treatment Interventions:     PT Goals Acute Rehab PT Goals PT Goal: Sit to Stand - Progress: Progressing toward goal PT Goal: Stand to Sit - Progress: Progressing toward goal PT Goal: Ambulate - Progress: Progressing toward goal  Visit Information  Last PT Received On: 06/20/12 Assistance Needed: +1    Subjective Data  Subjective: "I do not remember walking."   Cognition  Cognition Arousal/Alertness: Awake/alert Behavior During Therapy: WFL for tasks assessed/performed Overall Cognitive Status: Within Functional Limits for tasks assessed    Balance  Balance Balance Assessed: Yes Static Standing Balance Static Standing - Balance Support: No upper extremity supported Static Standing - Level of Assistance: 6: Modified independent (Device/Increase time) Dynamic Standing Balance Dynamic Standing - Balance Support: No upper extremity supported;During functional activity Dynamic Standing - Level of Assistance: 5: Stand by assistance High Level Balance High Level Balance Activites: Direction changes;Turns;Sudden stops;Head turns High Level Balance Comments: able to self recover, but demonstrated slow reaction and balance strategies  End of Session PT - End of Session Equipment Utilized During Treatment: Gait belt Activity Tolerance: Patient tolerated treatment well Patient left: in chair;with call bell/phone within reach Nurse Communication: Mobility status   GP     Greggory Stallion 06/20/2012, 11:00 AM

## 2012-06-20 NOTE — Progress Notes (Signed)
ANTICOAGULATION CONSULT NOTE - Follow Up Consult  Pharmacy Consult for Heparin / Coumadin Indication: PE  No Known Allergies  Labs:  Recent Labs  06/18/12 0350 06/19/12 0410 06/20/12 0615 06/20/12 1645  HGB 10.1* 8.9* 9.8*  --   HCT 29.1* 26.0* 28.9*  --   PLT 217 249 252  --   LABPROT  --  14.6 14.9  --   INR  --  1.16 1.19  --   HEPARINUNFRC 0.42 0.67 0.82* 0.85*  CREATININE 1.48* 2.08* 1.87*  --     Estimated Creatinine Clearance: 30.5 ml/min (by C-G formula based on Cr of 1.87).  Assessment:  Heparin level supratherapeutic this AM (0.82) CTA positive for new PE Heparin drip rate decreased to 1250 uts/hr  Recheck HL remains elevated 0.85, no bleeding noted  Goal of Therapy:  Heparin level 0.3-0.7 units/ml Monitor platelets by anticoagulation protocol: Yes INR goal is 2   Plan:  Hold heparin drip x1hr then  Decrease heparin drip to 110 units/hr Will f/u heparin level with AML  Leota Sauers Pharm.D. CPP, BCPS Clinical Pharmacist [phone] 06/20/2012 6:05 PM

## 2012-06-21 LAB — PROTIME-INR: Prothrombin Time: 15.3 seconds — ABNORMAL HIGH (ref 11.6–15.2)

## 2012-06-21 LAB — GLUCOSE, CAPILLARY
Glucose-Capillary: 92 mg/dL (ref 70–99)
Glucose-Capillary: 113 mg/dL — ABNORMAL HIGH (ref 70–99)
Glucose-Capillary: 117 mg/dL — ABNORMAL HIGH (ref 70–99)

## 2012-06-21 LAB — CBC
MCH: 26.6 pg (ref 26.0–34.0)
MCHC: 34.5 g/dL (ref 30.0–36.0)
MCV: 77 fL — ABNORMAL LOW (ref 78.0–100.0)
Platelets: 273 10*3/uL (ref 150–400)
RDW: 14.7 % (ref 11.5–15.5)

## 2012-06-21 MED ORDER — WARFARIN SODIUM 7.5 MG PO TABS
7.5000 mg | ORAL_TABLET | Freq: Once | ORAL | Status: AC
  Administered 2012-06-21: 7.5 mg via ORAL
  Filled 2012-06-21: qty 1

## 2012-06-21 NOTE — Progress Notes (Addendum)
ANTICOAGULATION CONSULT NOTE - Follow Up Consult  Pharmacy Consult for Heparin / Coumadin Indication: PE  No Known Allergies  Labs:  Recent Labs  06/19/12 0410 06/20/12 0615 06/20/12 1645 06/21/12 0437  HGB 8.9* 9.8*  --  9.7*  HCT 26.0* 28.9*  --  28.1*  PLT 249 252  --  273  LABPROT 14.6 14.9  --  15.3*  INR 1.16 1.19  --  1.23  HEPARINUNFRC 0.67 0.82* 0.85* 0.71*  CREATININE 2.08* 1.87*  --   --     Estimated Creatinine Clearance: 30.5 ml/min (by C-G formula based on Cr of 1.87).  Assessment:  Heparin level therapeutic  this AM (0.71) CTA positive for new PE INR = 1.23, started Coumadin 5/28, hx of GIB (day 4 of heparin / coumadin overlap) Pt is also on amiodarone  Goal of Therapy:  Heparin level 0.3-0.7 units/ml Monitor platelets by anticoagulation protocol: Yes INR goal is 2   Plan:  Decrease heparin drip to 1050 units/hr Coumadin 7.5mg  PO x 1 today Follow up AM labs  Thank you. Okey Regal, PharmD [phone]

## 2012-06-21 NOTE — Progress Notes (Signed)
Subjective:  Patient denies any chest pain states breathing is gradually improving feels much better  Objective:  Vital Signs in the last 24 hours: Temp:  [98.3 F (36.8 C)-98.9 F (37.2 C)] 98.3 F (36.8 C) (05/31 0652) Pulse Rate:  [57-67] 58 (05/31 0652) Resp:  [18-20] 20 (05/31 0652) BP: (118-189)/(63-81) 189/81 mmHg (05/31 0652) SpO2:  [97 %] 97 % (05/31 0652) Weight:  [76.2 kg (167 lb 15.9 oz)] 76.2 kg (167 lb 15.9 oz) (05/31 1610)  Intake/Output from previous day: 05/30 0701 - 05/31 0700 In: 1492.5 [P.O.:1140; I.V.:302.5; IV Piggyback:50] Out: 401 [Urine:400; Stool:1] Intake/Output from this shift: Total I/O In: 275.8 [P.O.:120; I.V.:155.8] Out: 400 [Urine:400]  Physical Exam: Neck: no adenopathy, no carotid bruit, no JVD and supple, symmetrical, trachea midline Lungs: clear to auscultation bilaterally Heart: regular rate and rhythm, S1, S2 normal, no murmur, click, rub or gallop Abdomen: soft, non-tender; bowel sounds normal; no masses,  no organomegaly Extremities: extremities normal, atraumatic, no cyanosis or edema  Lab Results:  Recent Labs  06/20/12 0615 06/21/12 0437  WBC 6.8 7.0  HGB 9.8* 9.7*  PLT 252 273    Recent Labs  06/19/12 0410 06/20/12 0615  NA 142 144  K 3.7 3.7  CL 104 106  CO2 30 30  GLUCOSE 117* 142*  BUN 26* 26*  CREATININE 2.08* 1.87*   No results found for this basename: TROPONINI, CK, MB,  in the last 72 hours Hepatic Function Panel No results found for this basename: PROT, ALBUMIN, AST, ALT, ALKPHOS, BILITOT, BILIDIR, IBILI,  in the last 72 hours No results found for this basename: CHOL,  in the last 72 hours No results found for this basename: PROTIME,  in the last 72 hours  Imaging: Imaging results have been reviewed and No results found.  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

## 2012-06-22 LAB — CBC
Platelets: 292 10*3/uL (ref 150–400)
RBC: 3.74 MIL/uL — ABNORMAL LOW (ref 4.22–5.81)
RDW: 14.6 % (ref 11.5–15.5)
WBC: 6.6 10*3/uL (ref 4.0–10.5)

## 2012-06-22 LAB — PROTIME-INR: Prothrombin Time: 17 seconds — ABNORMAL HIGH (ref 11.6–15.2)

## 2012-06-22 LAB — GLUCOSE, CAPILLARY: Glucose-Capillary: 81 mg/dL (ref 70–99)

## 2012-06-22 LAB — HEPARIN LEVEL (UNFRACTIONATED): Heparin Unfractionated: 0.72 IU/mL — ABNORMAL HIGH (ref 0.30–0.70)

## 2012-06-22 MED ORDER — WARFARIN SODIUM 7.5 MG PO TABS
7.5000 mg | ORAL_TABLET | Freq: Once | ORAL | Status: AC
  Administered 2012-06-22: 7.5 mg via ORAL
  Filled 2012-06-22: qty 1

## 2012-06-22 NOTE — Progress Notes (Signed)
ANTICOAGULATION CONSULT NOTE - Follow Up Consult  Pharmacy Consult for Heparin / Coumadin Indication: PE  No Known Allergies  Labs:  Recent Labs  06/20/12 0615 06/20/12 1645 06/21/12 0437 06/22/12 0455  HGB 9.8*  --  9.7* 9.8*  HCT 28.9*  --  28.1* 28.5*  PLT 252  --  273 292  LABPROT 14.9  --  15.3* 17.0*  INR 1.19  --  1.23 1.42  HEPARINUNFRC 0.82* 0.85* 0.71* 0.72*  CREATININE 1.87*  --   --   --     Estimated Creatinine Clearance: 30.5 ml/min (by C-G formula based on Cr of 1.87).  Assessment:  Heparin level therapeutic  this AM (0.71) CTA positive for new PE INR = 1.42, started Coumadin 5/28, hx of GIB (day 5 of heparin / coumadin overlap) Pt is also on amiodarone  Goal of Therapy:  Heparin level 0.3-0.7 units/ml Monitor platelets by anticoagulation protocol: Yes INR goal is 2   Plan:  Decrease heparin drip to 950 units/hr Coumadin 7.5mg  PO x 1 today Follow up AM labs  Change Rocephin / Zithromax to po options? Resume Flomax?  Thank you. Okey Regal, PharmD [phone]

## 2012-06-22 NOTE — Progress Notes (Signed)
Subjective:  Patient denies any chest pain or shortness of breath complains of occasional coughing with streaks of blood overall feels better  Objective:  Vital Signs in the last 24 hours: Temp:  [97.5 F (36.4 C)-99.3 F (37.4 C)] 97.5 F (36.4 C) (06/01 0500) Pulse Rate:  [58-60] 60 (06/01 0500) Resp:  [18-20] 18 (06/01 0500) BP: (123-142)/(51-63) 142/63 mmHg (06/01 0500) SpO2:  [97 %-99 %] 99 % (06/01 0500) Weight:  [75.479 kg (166 lb 6.4 oz)] 75.479 kg (166 lb 6.4 oz) (06/01 0500)  Intake/Output from previous day: 05/31 0701 - 06/01 0700 In: 1335.7 [P.O.:940; I.V.:395.7] Out: 850 [Urine:850] Intake/Output from this shift:    Physical Exam: Neck: no adenopathy, no carotid bruit, no JVD and supple, symmetrical, trachea midline Lungs: clear to auscultation bilaterally Heart: regular rate and rhythm, S1, S2 normal, no murmur, click, rub or gallop Abdomen: soft, non-tender; bowel sounds normal; no masses,  no organomegaly Extremities: extremities normal, atraumatic, no cyanosis or edema  Lab Results:  Recent Labs  06/21/12 0437 06/22/12 0455  WBC 7.0 6.6  HGB 9.7* 9.8*  PLT 273 292    Recent Labs  06/20/12 0615  NA 144  K 3.7  CL 106  CO2 30  GLUCOSE 142*  BUN 26*  CREATININE 1.87*   No results found for this basename: TROPONINI, CK, MB,  in the last 72 hours Hepatic Function Panel No results found for this basename: PROT, ALBUMIN, AST, ALT, ALKPHOS, BILITOT, BILIDIR, IBILI,  in the last 72 hours No results found for this basename: CHOL,  in the last 72 hours No results found for this basename: PROTIME,  in the last 72 hours  Imaging: Imaging results have been reviewed and No results found.  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

## 2012-06-23 LAB — CBC
HCT: 30.5 % — ABNORMAL LOW (ref 39.0–52.0)
Hemoglobin: 10.6 g/dL — ABNORMAL LOW (ref 13.0–17.0)
MCHC: 34.8 g/dL (ref 30.0–36.0)
MCV: 76.1 fL — ABNORMAL LOW (ref 78.0–100.0)
RDW: 14.6 % (ref 11.5–15.5)

## 2012-06-23 LAB — GLUCOSE, CAPILLARY
Glucose-Capillary: 123 mg/dL — ABNORMAL HIGH (ref 70–99)
Glucose-Capillary: 93 mg/dL (ref 70–99)
Glucose-Capillary: 83 mg/dL (ref 70–99)
Glucose-Capillary: 83 mg/dL (ref 70–99)

## 2012-06-23 MED ORDER — WARFARIN SODIUM 7.5 MG PO TABS
7.5000 mg | ORAL_TABLET | Freq: Once | ORAL | Status: AC
  Administered 2012-06-23: 7.5 mg via ORAL
  Filled 2012-06-23: qty 1

## 2012-06-23 NOTE — Progress Notes (Signed)
Physical Therapy Treatment Patient Details Name: [name] MRN: [id number] DOB: 09/12/1929 Today's Date: 06/23/2012 Time: 4098-1191 PT Time Calculation (min): 23 min  PT Assessment / Plan / Recommendation Comments on Treatment Session  Pt cont's to make steady progress with mobility.  Ambulated ~200' with quad cane today.  Overall steady.      Follow Up Recommendations  Home health PT     Does the patient have the potential to tolerate intense rehabilitation     Barriers to Discharge        Equipment Recommendations  None recommended by PT    Recommendations for Other Services    Frequency Min 3X/week   Plan Discharge plan remains appropriate    Precautions / Restrictions Restrictions Weight Bearing Restrictions: No       Mobility  Bed Mobility Bed Mobility: Supine to Sit;Sitting - Scoot to Edge of Bed Supine to Sit: 6: Modified independent (Device/Increase time);HOB elevated;With rails Sitting - Scoot to Edge of Bed: 6: Modified independent (Device/Increase time) Transfers Transfers: Sit to Stand;Stand to Sit Sit to Stand: 5: Supervision;With upper extremity assist;From bed;From toilet Stand to Sit: 5: Supervision;With upper extremity assist;With armrests;To toilet;To chair/3-in-1 Details for Transfer Assistance: cues for hand placement Ambulation/Gait Ambulation/Gait Assistance: 5: Supervision Ambulation Distance (Feet): 200 Feet Assistive device: Small based quad cane Ambulation/Gait Assistance Details: Pt ambulated ~200' with small base quad cane.  Pt states he varies between using it & not needing any AD at all.  Pt tends to carry cane for several feet & then begins to place it on floor again.  Overall pt is steady.   Gait Pattern: Step-through pattern;Decreased stride length (decreased floor clearance) Gait velocity: decreased Stairs: No Wheelchair Mobility Wheelchair Mobility: No     PT Goals Acute Rehab PT Goals Time For Goal Achievement:  07/02/12 Potential to Achieve Goals: Good Pt will go Sit to Stand: with modified independence PT Goal: Sit to Stand - Progress: Met Pt will go Stand to Sit: with modified independence PT Goal: Stand to Sit - Progress: Met Pt will Ambulate: 51 - 150 feet;with modified independence;with least restrictive assistive device PT Goal: Ambulate - Progress: Progressing toward goal Pt will Perform Home Exercise Program: Independently  Visit Information  Last PT Received On: 06/23/12 Assistance Needed: +1    Subjective Data      Cognition  Cognition Arousal/Alertness: Awake/alert Behavior During Therapy: WFL for tasks assessed/performed Overall Cognitive Status: Within Functional Limits for tasks assessed    Balance  Static Standing Balance Static Standing - Balance Support: No upper extremity supported Static Standing - Level of Assistance: 6: Modified independent (Device/Increase time) Dynamic Standing Balance Dynamic Standing - Balance Support: No upper extremity supported;During functional activity Dynamic Standing - Level of Assistance: 5: Stand by assistance  End of Session PT - End of Session Equipment Utilized During Treatment: Gait belt Activity Tolerance: Patient tolerated treatment well Patient left: in chair;with call bell/phone within reach Nurse Communication: Mobility status     Verdell Face, [place] [phone] 06/23/2012

## 2012-06-23 NOTE — Progress Notes (Signed)
Utilization Review Completed Kemiya Batdorf J. Nethan Caudillo, RN, BSN, NCM [phone]  

## 2012-06-23 NOTE — Progress Notes (Signed)
ANTICOAGULATION CONSULT NOTE - Follow Up Consult  Pharmacy Consult for Heparin / Coumadin Indication: PE  No Known Allergies  Labs:  Recent Labs  06/21/12 0437 06/22/12 0455 06/23/12 0415  HGB 9.7* 9.8* 10.6*  HCT 28.1* 28.5* 30.5*  PLT 273 292 311  LABPROT 15.3* 17.0* 18.9*  INR 1.23 1.42 1.64*  HEPARINUNFRC 0.71* 0.72* 0.47    Estimated Creatinine Clearance: 30.5 ml/min (by C-G formula based on Cr of 1.87).  Assessment:  82 yom continues on IV heparin and coumadin for new PE. Heparin level is therapeutic at 0.47 and INR remains subtherapeutic at 1.64 but starting to increase. Noted history of GIB. Pt is also on amiodarone which can increase the INR.   Goal of Therapy:  Heparin level 0.3-0.7 units/ml Monitor platelets by anticoagulation protocol: Yes INR goal is 2   Plan:  1. Continue heparin gtt at 950 units/hr 2. Repeat coumadin 7.5mg  PO x 1 tonight 3. F/u AM INR and heparin level  Lysle Pearl, PharmD, BCPS Pager # [phone] 06/23/2012 8:43 AM

## 2012-06-23 NOTE — Progress Notes (Signed)
Occupational Therapy Treatment and Discharge Patient Details Name: Hardin Hardenbrook MRN: [id number] DOB: 05-Dec-1929 Today's Date: 06/23/2012 Time: 4696-2952 OT Time Calculation (min): 34 min  OT Assessment / Plan / Recommendation Comments on Treatment Session This 82 has met all goals, acute OT will sign off.    Follow Up Recommendations  No OT follow up       Equipment Recommendations  None recommended by OT       Frequency Min 2X/week   Plan Discharge plan remains appropriate    Precautions / Restrictions Precautions Precautions: None Restrictions Weight Bearing Restrictions: No       ADL  Grooming: Performed;Modified independent;Wash/dry face Where Assessed - Grooming: Unsupported standing Upper Body Bathing: Performed;Modified independent Where Assessed - Upper Body Bathing: Unsupported sit to stand Lower Body Bathing: Performed;Modified independent Where Assessed - Lower Body Bathing: Unsupported sit to stand Toilet Transfer: Performed;Modified independent Toilet Transfer Method: Sit to Barista: Materials engineer and Hygiene: Performed;Modified independent Where Assessed - Toileting Clothing Manipulation and Hygiene: Sit to stand from 3-in-1 or toilet Tub/Shower Transfer: Performed;Modified independent Tub/Shower Transfer Method: Science writer:  (3n1 in shower stall) Equipment Used:  (quad cane) Transfers/Ambulation Related to ADLs: Mod I for all with quad cane (sometimes he just carries it)      OT Goals ADL Goals ADL Goal: Upper Body Bathing - Progress: Met ADL Goal: Lower Body Bathing - Progress: Met ADL Goal: Tub/Shower Transfer - Progress: Met  Visit Information  Last OT Received On: 06/23/12 Assistance Needed: +1          Cognition  Cognition Arousal/Alertness: Awake/alert Behavior During Therapy: WFL for tasks assessed/performed Overall Cognitive Status: Within  Functional Limits for tasks assessed    Mobility  Bed Mobility Bed Mobility: Supine to Sit;Sitting - Scoot to Edge of Bed Supine to Sit: 6: Modified independent (Device/Increase time);HOB elevated;With rails Sitting - Scoot to Edge of Bed: 6: Modified independent (Device/Increase time) Details for Bed Mobility Assistance: Pt in recliner upon arrival Transfers Transfers: Sit to Stand;Stand to Sit Sit to Stand: 6: Modified independent (Device/Increase time);With upper extremity assist;With armrests;From chair/3-in-1 Stand to Sit: 6: Modified independent (Device/Increase time);With upper extremity assist;With armrests;To chair/3-in-1 Details for Transfer Assistance: cues for hand placement          End of Session OT - End of Session Equipment Utilized During Treatment:  (quad cane) Activity Tolerance: Patient tolerated treatment well Patient left: in chair (sitting at sink with NT, "D")       Evette Georges [phone] 06/23/2012, 11:45 AM

## 2012-06-23 NOTE — Progress Notes (Signed)
Met with Mr Omary on 06/20/12 at bedside to discuss [facility] Care Management services. He reports he lives alone but has a supportive friend and friend's son that assists him with transporting to MD appointments. Consents were signed and it was explained that [facility] Care Management will not interfere or replace home health services. It appears patient will go home with home health upon discharge. Left brochure and contact information with patient at bedside. He will receive a post hospital transition of care call and will be evaluated for monthly home visits. Confirmed best contact number.Raiford Noble, MSN-Ed, RN,BSN-- [facility] [phone]

## 2012-06-23 NOTE — Progress Notes (Signed)
Subjective:  Patient denies any chest pain or shortness of breath no further hemoptysis. Tolerating heparin and Coumadin okay  Objective:  Vital Signs in the last 24 hours: Temp:  [97.1 F (36.2 C)-98.8 F (37.1 C)] 97.1 F (36.2 C) (06/02 0900) Pulse Rate:  [58-64] 63 (06/02 0900) Resp:  [19-20] 20 (06/02 0900) BP: (124-174)/(58-81) 153/66 mmHg (06/02 0900) SpO2:  [94 %-100 %] 100 % (06/02 0900) Weight:  [75.433 kg (166 lb 4.8 oz)] 75.433 kg (166 lb 4.8 oz) (06/02 0725)  Intake/Output from previous day: 06/01 0701 - 06/02 0700 In: 1394 [P.O.:1160; I.V.:234] Out: 400 [Urine:400] Intake/Output from this shift: Total I/O In: 240 [P.O.:240] Out: 75 [Urine:75]  Physical Exam: Neck: no adenopathy, no carotid bruit, no JVD and supple, symmetrical, trachea midline Lungs: clear to auscultation bilaterally Heart: regular rate and rhythm, S1, S2 normal, no murmur, click, rub or gallop Abdomen: soft, non-tender; bowel sounds normal; no masses,  no organomegaly Extremities: extremities normal, atraumatic, no cyanosis or edema  Lab Results:  Recent Labs  06/22/12 0455 06/23/12 0415  WBC 6.6 7.2  HGB 9.8* 10.6*  PLT 292 311   No results found for this basename: NA, K, CL, CO2, GLUCOSE, BUN, CREATININE,  in the last 72 hours No results found for this basename: TROPONINI, CK, MB,  in the last 72 hours Hepatic Function Panel No results found for this basename: PROT, ALBUMIN, AST, ALT, ALKPHOS, BILITOT, BILIDIR, IBILI,  in the last 72 hours No results found for this basename: CHOL,  in the last 72 hours No results found for this basename: PROTIME,  in the last 72 hours  Imaging: Imaging results have been reviewed and No results found.  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  LOS: 10 days    Valmore Arabie N 06/23/2012, 11:01 AM

## 2012-06-24 LAB — PROTIME-INR: INR: 1.83 — ABNORMAL HIGH (ref 0.00–1.49)

## 2012-06-24 LAB — CBC
HCT: 32 % — ABNORMAL LOW (ref 39.0–52.0)
Hemoglobin: 11.3 g/dL — ABNORMAL LOW (ref 13.0–17.0)
MCV: 76.4 fL — ABNORMAL LOW (ref 78.0–100.0)
RDW: 14.8 % (ref 11.5–15.5)
WBC: 7.1 10*3/uL (ref 4.0–10.5)

## 2012-06-24 LAB — GLUCOSE, CAPILLARY: Glucose-Capillary: 147 mg/dL — ABNORMAL HIGH (ref 70–99)

## 2012-06-24 MED ORDER — WARFARIN SODIUM 7.5 MG PO TABS
7.5000 mg | ORAL_TABLET | Freq: Once | ORAL | Status: AC
  Administered 2012-06-24: 7.5 mg via ORAL
  Filled 2012-06-24: qty 1

## 2012-06-24 NOTE — Progress Notes (Signed)
ANTICOAGULATION CONSULT NOTE - Follow Up Consult  Pharmacy Consult for Heparin / Coumadin Indication: PE  No Known Allergies  Labs:  Recent Labs  06/22/12 0455 06/23/12 0415 06/24/12 0520  HGB 9.8* 10.6* 11.3*  HCT 28.5* 30.5* 32.0*  PLT 292 311 355  LABPROT 17.0* 18.9* 20.5*  INR 1.42 1.64* 1.83*  HEPARINUNFRC 0.72* 0.47 0.39    Estimated Creatinine Clearance: 30.5 ml/min (by C-G formula based on Cr of 1.87).  Assessment:  82 yom continues on IV heparin and coumadin for new PE. Heparin level is therapeutic at 0.39 and INR remains subtherapeutic at 1.83 but is trending up nicely. Noted history of GIB. Pt is also on amiodarone which can increase the INR.   Goal of Therapy:  Heparin level 0.3-0.7 units/ml Monitor platelets by anticoagulation protocol: Yes INR goal is 2   Plan:  1. Continue heparin gtt at 950 units/hr 2. Repeat coumadin 7.5mg  PO x 1 tonight 3. F/u AM INR and heparin level  Lysle Pearl, PharmD, BCPS Pager # [phone] 06/24/2012 8:34 AM

## 2012-06-24 NOTE — Progress Notes (Signed)
Subjective:  Patient denies any chest pain or shortness of breath. Tolerating heparin/Coumadin well. Hemoglobin remained stable  Objective:  Vital Signs in the last 24 hours: Temp:  [97.9 F (36.6 C)-98.2 F (36.8 C)] 97.9 F (36.6 C) (06/03 0534) Pulse Rate:  [56-66] 63 (06/03 0855) Resp:  [18] 18 (06/03 0534) BP: (145-177)/(58-84) 148/68 mmHg (06/03 0855) SpO2:  [100 %] 100 % (06/03 0534) Weight:  [74.3 kg (163 lb 12.8 oz)] 74.3 kg (163 lb 12.8 oz) (06/03 0534)  Intake/Output from previous day: 06/02 0701 - 06/03 0700 In: 603.7 [P.O.:480; I.V.:123.7] Out: 2100 [Urine:2100] Intake/Output from this shift: Total I/O In: -  Out: 325 [Urine:325]  Physical Exam: Neck: no adenopathy, no carotid bruit, no JVD and supple, symmetrical, trachea midline Lungs: clear to auscultation bilaterally Heart: regular rate and rhythm, S1, S2 normal, no murmur, click, rub or gallop Abdomen: soft, non-tender; bowel sounds normal; no masses,  no organomegaly Extremities: extremities normal, atraumatic, no cyanosis or edema  Lab Results:  Recent Labs  06/23/12 0415 06/24/12 0520  WBC 7.2 7.1  HGB 10.6* 11.3*  PLT 311 355   No results found for this basename: NA, K, CL, CO2, GLUCOSE, BUN, CREATININE,  in the last 72 hours No results found for this basename: TROPONINI, CK, MB,  in the last 72 hours Hepatic Function Panel No results found for this basename: PROT, ALBUMIN, AST, ALT, ALKPHOS, BILITOT, BILIDIR, IBILI,  in the last 72 hours No results found for this basename: CHOL,  in the last 72 hours No results found for this basename: PROTIME,  in the last 72 hours  Imaging: Imaging results have been reviewed and No results found.  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

## 2012-06-25 LAB — CBC
Hemoglobin: 11.7 g/dL — ABNORMAL LOW (ref 13.0–17.0)
MCH: 26.9 pg (ref 26.0–34.0)
MCV: 76.1 fL — ABNORMAL LOW (ref 78.0–100.0)
RBC: 4.35 MIL/uL (ref 4.22–5.81)
WBC: 7.7 10*3/uL (ref 4.0–10.5)

## 2012-06-25 LAB — GLUCOSE, CAPILLARY
Glucose-Capillary: 125 mg/dL — ABNORMAL HIGH (ref 70–99)
Glucose-Capillary: 88 mg/dL (ref 70–99)

## 2012-06-25 MED ORDER — SENNOSIDES-DOCUSATE SODIUM 8.6-50 MG PO TABS: 1.0000 | ORAL_TABLET | Freq: Two times a day (BID) | ORAL | Status: DC | PRN

## 2012-06-25 MED ORDER — CARVEDILOL 3.125 MG PO TABS: 3.1250 mg | ORAL_TABLET | Freq: Two times a day (BID) | ORAL | Status: DC

## 2012-06-25 MED ORDER — WARFARIN SODIUM 5 MG PO TABS
5.0000 mg | ORAL_TABLET | Freq: Once | ORAL | Status: DC
  Filled 2012-06-25: qty 1

## 2012-06-25 MED ORDER — WARFARIN SODIUM 4 MG PO TABS: 4.0000 mg | ORAL_TABLET | Freq: Once | ORAL | Status: DC

## 2012-06-25 MED ORDER — AMIODARONE HCL 200 MG PO TABS: 100.0000 mg | ORAL_TABLET | Freq: Every day | ORAL | Status: DC

## 2012-06-25 MED ORDER — FERROUS SULFATE 325 (65 FE) MG PO TABS: 325.0000 mg | ORAL_TABLET | Freq: Three times a day (TID) | ORAL | Status: DC

## 2012-06-25 NOTE — Discharge Summary (Signed)
  Discharge summary dictated on 06/25/2012 dictation number is [phone]

## 2012-06-25 NOTE — Progress Notes (Signed)
ANTICOAGULATION CONSULT NOTE - Follow Up Consult  Pharmacy Consult for Heparin / Coumadin Indication: PE  No Known Allergies  Labs:  Recent Labs  06/23/12 0415 06/24/12 0520 06/25/12 0450  HGB 10.6* 11.3* 11.7*  HCT 30.5* 32.0* 33.1*  PLT 311 355 361  LABPROT 18.9* 20.5* 22.9*  INR 1.64* 1.83* 2.13*  HEPARINUNFRC 0.47 0.39 0.41    Estimated Creatinine Clearance: 30.5 ml/min (by C-G formula based on Cr of 1.87).  Assessment:  82 yom continues on IV heparin and coumadin for new PE. Heparin level is therapeutic at 0.41 and INR is now therapeutic at 2.13. Noted history of GIB. Pt is also on amiodarone which can increase the INR.   Appear patient was on warfarin 4mg  daily in the past.   Goal of Therapy:  Heparin level 0.3-0.7 units/ml Monitor platelets by anticoagulation protocol: Yes INR goal is 2   Plan:  1. Continue heparin gtt at 950 units/hr - consider DC since INR is at goal 2. Coumadin 5mg  PO x 1 tonight 3. F/u AM INR and heparin level 4. Consider DC antibiotics - today is day #8  If discharged today, consider discharging on warfarin 5mg  daily. Goal INR is 2 and INR may still continue to increase d/t amiodarone interaction.   Lysle Pearl, PharmD, BCPS Pager # [phone] 06/25/2012 8:31 AM

## 2012-06-26 NOTE — Discharge Summary (Signed)
[name], SEMPER NO.:  [id number]  MEDICAL RECORD NO.:  [id number]  LOCATION:  4713                         FACILITY:  [facility]  PHYSICIAN:  Eduardo Osier. Sharyn Lull, M.D. DATE OF BIRTH:  14-Dec-1929  DATE OF ADMISSION:  06/13/2012 DATE OF DISCHARGE:  06/25/2012                              DISCHARGE SUMMARY   ADMITTING DIAGNOSES: 1. Atypical chest pain with elevated D-dimers, rule out pulmonary     embolism, rule out myocardial infarction. 2. Coronary artery disease, history of multiple percutaneous coronary     interventions in the past. 3. Mild systolic heart failure. 4. History of pulmonary embolism in the past. 5. History of deep vein thrombosis in the past. 6. History of paroxysmal atrial fibrillation in the past. 7. Hypertension. 8. Diabetes mellitus. 9. Hypercholesteremia. 10.Remote tobacco abuse. 11.Chronic kidney disease, stage III. 12.Hypothyroidism. 13.History of pancreatitis. 14.Gastroesophageal reflux disease. 15.Chronic abdominal pain and constipation.  FINAL DIAGNOSES: 1. Status post chest pain, myocardial infarction ruled out. 2. New bilateral pulmonary embolism. 3. Status post bilateral pneumonia. 4. Compensated systolic heart failure. 5. Coronary artery disease, history of multiple percutaneous coronary     interventions in the past. 6. History of pulmonary embolism in the past. 7. History of deep vein thrombosis in the past. 8. History of paroxysmal atrial fibrillation in the past. 9. Hypertension. 10.Diabetes mellitus. 11.Hypercholesteremia. 12.Remote tobacco abuse. 13.Chronic kidney disease, stage III. 14.Hypothyroidism. 15.History of pancreatitis. 16.Gastroesophageal reflux disease. 17.Chronic abdominal pain, stable.  DISCHARGE HOME MEDICATIONS: 1. Carvedilol 3.125 mg 1 tablet twice daily. 2. Ferrous sulfate 325 mg 1 tablet 3 times daily as tolerated. 3. Senokot-S 1-2 tablets twice daily for constipation. 4. Warfarin 4 mg 1  tablet daily at 4 p.m. 5. Amiodarone 100 mg daily. 6. Albuterol inhaler 2 puffs every 6 hours as before. 7. Xanax 0.25 mg daily at night as before. 8. Enteric-coated aspirin 81 mg daily. 9. Dexilant 60 mg 1 capsule daily. 10.Furosemide 40 mg daily. 11.Imdur 60 mg 1 tablet daily. 12.Zaditor eyedrops twice daily as before. 13.Levothyroxine 50 mcg 1 tablet daily. 14.Losartan 100 mg 1 tablet p.o. daily. 15.Nitrostat 0.4 mg sublingual use as directed. 16.MiraLAX 17 g twice daily as needed for constipation. 17.Liquifilm tears eyedrops as before. 18.Ranitidine 300 mg at night. 19.Crestor 10 mg 1 tablet daily. 20.Flomax 0.4 mg 1 capsule daily.  DIET:  Low-salt, low-cholesterol.  The patient has been advised to monitor blood sugar daily.  The patient has been advised to call 911, if he notices any bleeding per rectum or feels dizzy, tired, weak.  Follow up with me in next 5 days.  We will check PT/INR early next week. Follow up with GI as scheduled.  CONDITION AT DISCHARGE:  Stable.  BRIEF HISTORY AND HOSPITAL COURSE:  [name] is an 79 year old male with past medical history significant for multiple medical problems, i.e., coronary artery disease, history of MI x2 in the past, status post PTCA stenting to LAD, left circumflex, and RCA in the past, ischemic cardiomyopathy, history of congestive heart failure secondary to systolic dysfunction; history of bilateral pulmonary embolism in the past, status post recent GI bleeding/Coumadin toxicity requiring stopping of anticoagulants, history of paroxysmal AFib, hypercholesteremia, non-insulin-dependent  diabetes mellitus, history of DVT in the past, history of remote tobacco abuse, chronic kidney disease, stage III, hypothyroidism, history of pancreatitis in the past, GERD, chronic abdominal pain, extensive work up negative in the past. He came to the ER complaining of pleuritic retrosternal left-sided chest pain radiating to left  shoulder, left arm off and on since yesterday associated with mild shortness of breath, and nausea.  The patient denies any cough, fever, chills.  The patient also gives history of exertional dyspnea, feeling weak, tired, fatigued, also complains of vague abdominal pain.  The patient denies any vomiting, denies constipation or diarrhea.  The patient denies any palpitation, lightheadedness, or syncope.  Denies PND, orthopnea.  Complains of minimal leg swelling.  The patient received sublingual nitro with partial relief of chest pain.  EKG done in the ER showed normal sinus rhythm with left bundle-branch block.  The patient also was noted to have elevated D-dimer.  Patient empirically was started on heparin with improvement in his symptoms.  CT angiogram could not be done due to elevated creatinine.  V/Q scan is still pending.  PHYSICAL EXAMINATION:  GENERAL:  He was alert, awake, oriented x3. VITAL SIGNS:  His blood pressure is 155/60, pulse is 74, temperature is 99.4. HEENT:  Conjunctiva was pink. NECK:  Supple.  No JVD.  No bruit. LUNGS:  Decreased breath sound at bases. CARDIOVASCULAR:  S1, S2 Is normal.  There Is soft systolic murmur and S3 gallop. ABDOMEN:  Soft.  Bowel sounds were present, nontender. EXTREMITIES:  There is no clubbing, cyanosis.  There was trace edema. NEUROLOGIC:  Grossly intact.  LABORATORY DATA:  Sodium was 140, potassium 3.9, BUN was 14, creatinine 1.66.  Three sets of cardiac enzymes were negative.  ProBNP was slightly elevated 583.  Hemoglobin was 10, hematocrit 29.0, white count of 10.1.  Chest x-ray showed low lung volumes with vascular crowding and bibasilar atelectasis.  V/Q scan showed ventilatory perfusion and radiographic abnormalities in bilateral lower lobes matching subsegmental ventilation and perfusion defect in the anterior right upper lobe.  Subsegmental perfusion defect in the lingula which demonstrated normal ventilation. Overall findings  represent an intermediate probability for pulmonary embolism.  The patient also had hypercoagulable workup.  His lupus anticoagulant was positive.  The patient also underwent duplex ultrasound of lower extremities which was negative for DVT.  On Jun 18, 2012, patient underwent CT angio after hydration which showed pulmonary embolism within the segmental pulmonary artery to the right lower lobe and small amount of pulmonary embolism seen within the subsegmental pulmonary artery on the left lower lobe.  Also noted to have bilateral airspace opacities.  BRIEF HOSPITAL COURSE:  The patient was initially started on IV heparin and V/Q scan was intermediate probability.  In view of recurrent pulmonary embolism, and also recurrent GI bleeding.  The patient initially was referred to Interventional Radiology for possible IVC filter as patient had history of DVT in the past, but repeat duplex ultrasound of lower extremities were negative for DVT.  The patient subsequently was slowly hydrated and underwent CT of the chest as above which showed new bilateral pulmonary embolism.  The patient was started on Coumadin and continued on heparin.  The patient had occasional streaking of blood, but otherwise no frank hemoptysis.  The patient's INR today is above 2.  The patient is ambulating in hallway without any problems.  There is no further episodes of hemoptysis.  The patient has been advised to check PT/INR early next week and will  be followed up in my office early next week.  We will try to maintain INR close to 2-2.5. The patient will be discharged home today and will be followed up in my office early next week.  The patient also was treated for bilateral pneumonia with IV Rocephin and Zithromax.  The patient has pleuritic chest pain, this completely resolved.     Eduardo Osier. Sharyn Lull, M.D.     [facility]/MEDQ  D:  06/25/2012  T:  06/26/2012  Job:  [id number]

## 2012-09-21 ENCOUNTER — Emergency Department (HOSPITAL_COMMUNITY): Payer: Medicare Other

## 2012-09-21 ENCOUNTER — Emergency Department (HOSPITAL_COMMUNITY)
Admission: EM | Admit: 2012-09-21 | Discharge: 2012-09-21 | Disposition: A | Discharge status: Home / Self Care | Payer: Medicare Other | Attending: Emergency Medicine | Admitting: Emergency Medicine

## 2012-09-21 ENCOUNTER — Encounter (HOSPITAL_COMMUNITY): Payer: Self-pay | Admitting: Emergency Medicine

## 2012-09-21 DIAGNOSIS — F411 Generalized anxiety disorder: Secondary | ICD-10-CM | POA: Insufficient documentation

## 2012-09-21 DIAGNOSIS — M109 Gout, unspecified: Secondary | ICD-10-CM | POA: Insufficient documentation

## 2012-09-21 DIAGNOSIS — I252 Old myocardial infarction: Secondary | ICD-10-CM | POA: Insufficient documentation

## 2012-09-21 DIAGNOSIS — Z8701 Personal history of pneumonia (recurrent): Secondary | ICD-10-CM | POA: Insufficient documentation

## 2012-09-21 DIAGNOSIS — R079 Chest pain, unspecified: Secondary | ICD-10-CM | POA: Insufficient documentation

## 2012-09-21 DIAGNOSIS — M549 Dorsalgia, unspecified: Secondary | ICD-10-CM | POA: Insufficient documentation

## 2012-09-21 DIAGNOSIS — M129 Arthropathy, unspecified: Secondary | ICD-10-CM | POA: Insufficient documentation

## 2012-09-21 DIAGNOSIS — H9190 Unspecified hearing loss, unspecified ear: Secondary | ICD-10-CM | POA: Insufficient documentation

## 2012-09-21 DIAGNOSIS — Z8673 Personal history of transient ischemic attack (TIA), and cerebral infarction without residual deficits: Secondary | ICD-10-CM | POA: Insufficient documentation

## 2012-09-21 DIAGNOSIS — F3289 Other specified depressive episodes: Secondary | ICD-10-CM | POA: Insufficient documentation

## 2012-09-21 DIAGNOSIS — F329 Major depressive disorder, single episode, unspecified: Secondary | ICD-10-CM | POA: Insufficient documentation

## 2012-09-21 DIAGNOSIS — I209 Angina pectoris, unspecified: Secondary | ICD-10-CM | POA: Insufficient documentation

## 2012-09-21 DIAGNOSIS — G8929 Other chronic pain: Secondary | ICD-10-CM

## 2012-09-21 DIAGNOSIS — E039 Hypothyroidism, unspecified: Secondary | ICD-10-CM | POA: Insufficient documentation

## 2012-09-21 DIAGNOSIS — Z7901 Long term (current) use of anticoagulants: Secondary | ICD-10-CM | POA: Insufficient documentation

## 2012-09-21 DIAGNOSIS — J45909 Unspecified asthma, uncomplicated: Secondary | ICD-10-CM | POA: Insufficient documentation

## 2012-09-21 DIAGNOSIS — I10 Essential (primary) hypertension: Secondary | ICD-10-CM | POA: Insufficient documentation

## 2012-09-21 DIAGNOSIS — Z87891 Personal history of nicotine dependence: Secondary | ICD-10-CM | POA: Insufficient documentation

## 2012-09-21 DIAGNOSIS — R1031 Right lower quadrant pain: Secondary | ICD-10-CM | POA: Insufficient documentation

## 2012-09-21 DIAGNOSIS — E119 Type 2 diabetes mellitus without complications: Secondary | ICD-10-CM | POA: Insufficient documentation

## 2012-09-21 DIAGNOSIS — H409 Unspecified glaucoma: Secondary | ICD-10-CM | POA: Insufficient documentation

## 2012-09-21 DIAGNOSIS — Z79899 Other long term (current) drug therapy: Secondary | ICD-10-CM | POA: Insufficient documentation

## 2012-09-21 DIAGNOSIS — Z86718 Personal history of other venous thrombosis and embolism: Secondary | ICD-10-CM | POA: Insufficient documentation

## 2012-09-21 DIAGNOSIS — E785 Hyperlipidemia, unspecified: Secondary | ICD-10-CM | POA: Insufficient documentation

## 2012-09-21 DIAGNOSIS — K219 Gastro-esophageal reflux disease without esophagitis: Secondary | ICD-10-CM | POA: Insufficient documentation

## 2012-09-21 DIAGNOSIS — N289 Disorder of kidney and ureter, unspecified: Secondary | ICD-10-CM | POA: Insufficient documentation

## 2012-09-21 DIAGNOSIS — Z86711 Personal history of pulmonary embolism: Secondary | ICD-10-CM | POA: Insufficient documentation

## 2012-09-21 LAB — CBC WITH DIFFERENTIAL/PLATELET
Basophils Absolute: 0 10*3/uL (ref 0.0–0.1)
Basophils Relative: 0 % (ref 0–1)
Eosinophils Absolute: 0.1 10*3/uL (ref 0.0–0.7)
HCT: 37 % — ABNORMAL LOW (ref 39.0–52.0)
MCH: 27.5 pg (ref 26.0–34.0)
MCHC: 35.1 g/dL (ref 30.0–36.0)
Monocytes Absolute: 0.6 10*3/uL (ref 0.1–1.0)
Monocytes Relative: 11 % (ref 3–12)
Neutro Abs: 2.7 10*3/uL (ref 1.7–7.7)
Neutrophils Relative %: 54 % (ref 43–77)
RDW: 20.3 % — ABNORMAL HIGH (ref 11.5–15.5)

## 2012-09-21 LAB — COMPREHENSIVE METABOLIC PANEL
AST: 26 U/L (ref 0–37)
Albumin: 3.3 g/dL — ABNORMAL LOW (ref 3.5–5.2)
BUN: 18 mg/dL (ref 6–23)
Chloride: 104 mEq/L (ref 96–112)
Creatinine, Ser: 1.48 mg/dL — ABNORMAL HIGH (ref 0.50–1.35)
Total Bilirubin: 0.5 mg/dL (ref 0.3–1.2)
Total Protein: 6.6 g/dL (ref 6.0–8.3)

## 2012-09-21 LAB — URINALYSIS, ROUTINE W REFLEX MICROSCOPIC
Leukocytes, UA: NEGATIVE
Nitrite: NEGATIVE
Specific Gravity, Urine: 1.008 (ref 1.005–1.030)
Urobilinogen, UA: 0.2 mg/dL (ref 0.0–1.0)
pH: 7.5 (ref 5.0–8.0)

## 2012-09-21 LAB — LACTIC ACID, PLASMA: Lactic Acid, Venous: 0.9 mmol/L (ref 0.5–2.2)

## 2012-09-21 LAB — LIPASE, BLOOD: Lipase: 34 U/L (ref 11–59)

## 2012-09-21 MED ORDER — SODIUM CHLORIDE 0.9 % IV BOLUS (SEPSIS)
500.0000 mL | Freq: Once | INTRAVENOUS | Status: AC
  Administered 2012-09-21: 500 mL via INTRAVENOUS

## 2012-09-21 MED ORDER — IOHEXOL 300 MG/ML  SOLN
80.0000 mL | Freq: Once | INTRAMUSCULAR | Status: AC | PRN
  Administered 2012-09-21: 80 mL via INTRAVENOUS

## 2012-09-21 MED ORDER — ONDANSETRON HCL 4 MG/2ML IJ SOLN
4.0000 mg | Freq: Once | INTRAMUSCULAR | Status: AC
  Administered 2012-09-21: 4 mg via INTRAVENOUS
  Filled 2012-09-21: qty 2

## 2012-09-21 MED ORDER — MORPHINE SULFATE 4 MG/ML IJ SOLN
4.0000 mg | Freq: Once | INTRAMUSCULAR | Status: AC
  Administered 2012-09-21: 4 mg via INTRAVENOUS
  Filled 2012-09-21: qty 1

## 2012-09-21 NOTE — ED Provider Notes (Signed)
CSN: [id number]     Arrival date & time 09/21/12  1009 History   First MD Initiated Contact with Patient 09/21/12 1025     Chief Complaint  Patient presents with  . Dizziness   (Consider location/radiation/quality/duration/timing/severity/associated sxs/prior Treatment) HPI Comments: A 79 year old male presents with multiple complaints. His primary complaint is right sided abdominal pain. He said his pain for several months. He states that he always gets pain down there whenever he eats. Worsening over the last week. He was told he had a hernia over a month ago and that's all surgeon he said he did not have a hernia. He denies any vomiting or diarrhea. Denies any urinary symptoms or fevers. He also has had dizziness versus on for 7 months. He denies syncope. He is also having chest pain that he always has with his abdominal pain. He's had this multiple times as well.  The history is provided by the patient.    Past Medical History  Diagnosis Date  . GERD (gastroesophageal reflux disease)   . Hypothyroidism   . Chronic back pain   . Stroke   . Gout     no problems in 2 years  . Pulmonary embolism, bilateral 2010  . Hyperlipemia   . Myocardial infarction 1987; 1989  . Anginal pain   . DVT, lower extremity 1980's    LLE  . Asthma     "touch"  . Pneumonia 1970's  . Renal disorder ~ 03/2011    "wasn't functioning right"  . Arthritis     "mostly in my knees and shoulders"  . Stroke     "just a little one"  . Collapsed lung     "for 14 years" right   . Diabetes mellitus   . Hypertension   . Depression   . Anxiety   . Hearing loss   . Glaucoma   . Complication of anesthesia   . PONV (postoperative nausea and vomiting)    Past Surgical History  Procedure Laterality Date  . Esophagogastroduodenoscopy  12/22/2010    Procedure: ESOPHAGOGASTRODUODENOSCOPY (EGD);  Surgeon: Theda Belfast;  Location: [facility]COPY;  Service: Endoscopy;  Laterality: N/A;  . Appendectomy  1950  .  Cataract extraction w/ intraocular lens  implant, bilateral Bilateral 1990's  . Cardiac catheterization    . Coronary angioplasty with stent placement      "think I have 4 total"  . Inguinal hernia repair Bilateral 03/28/2012    Procedure: LAPAROSCOPIC BILATERAL INGUINAL HERNIA REPAIR;  Surgeon: Ardeth Sportsman, MD;  Location: [facility] ORS;  Service: General;  Laterality: Bilateral;  . Insertion of mesh Bilateral 03/28/2012    Procedure: INSERTION OF MESH;  Surgeon: Ardeth Sportsman, MD;  Location: [facility] ORS;  Service: General;  Laterality: Bilateral;  . Hernia repair    . Esophagogastroduodenoscopy N/A 04/30/2012    Procedure: ESOPHAGOGASTRODUODENOSCOPY (EGD);  Surgeon: Theda Belfast, MD;  Location: [facility] ENDOSCOPY;  Service: Endoscopy;  Laterality: N/A;  . Colonoscopy N/A 05/01/2012    Procedure: COLONOSCOPY;  Surgeon: Theda Belfast, MD;  Location: [facility] ENDOSCOPY;  Service: Endoscopy;  Laterality: N/A;   Family History  Problem Relation Age of Onset  . Diabetes Mother   . Heart disease Father   . Lung cancer Brother   . Stroke Neg Hx   . Hypertension Neg Hx   . Hyperlipidemia Neg Hx   . Alcohol abuse Neg Hx    History  Substance Use Topics  . Smoking status: Former Smoker -- 2.00 packs/day  for 3 years    Types: Cigarettes    Start date: 01/27/1946    Quit date: 01/22/1981  . Smokeless tobacco: Never Used  . Alcohol Use: No     Comment: 07/12/2011; "last drink of alcohol was ~ 1983"    Review of Systems  Constitutional: Negative for fever and chills.  Respiratory: Negative for shortness of breath.   Cardiovascular: Positive for chest pain.  Gastrointestinal: Positive for abdominal pain. Negative for nausea and vomiting.  Genitourinary: Negative for dysuria.  Musculoskeletal: Negative for back pain.  Neurological: Negative for weakness.  All other systems reviewed and are negative.    Allergies  Review of patient's allergies indicates no known allergies.  Home Medications   Current  Outpatient Rx  Name  Route  Sig  Dispense  Refill  . albuterol (PROVENTIL HFA;VENTOLIN HFA) 108 (90 BASE) MCG/ACT inhaler   Inhalation   Inhale 2 puffs into the lungs every 6 (six) hours as needed for shortness of breath.          Marland Kitchen amiodarone (PACERONE) 200 MG tablet   Oral   Take 200 mg by mouth daily.         Marland Kitchen atorvastatin (LIPITOR) 40 MG tablet   Oral   Take 40 mg by mouth daily.         . beclomethasone (BECONASE-AQ) 42 MCG/SPRAY nasal spray   Nasal   Place 1 spray into the nose 2 (two) times daily. Dose is for each nostril.         . carvedilol (COREG) 3.125 MG tablet   Oral   Take 3.125 mg by mouth 2 (two) times daily with a meal.         . ferrous sulfate 325 (65 FE) MG tablet   Oral   Take 325 mg by mouth 3 (three) times daily with meals.         . furosemide (LASIX) 40 MG tablet   Oral   Take 40 mg by mouth daily.          Marland Kitchen glimepiride (AMARYL) 1 MG tablet   Oral   Take 1 mg by mouth daily before breakfast.         . isosorbide mononitrate (IMDUR) 60 MG 24 hr tablet   Oral   Take 60 mg by mouth every morning.          Marland Kitchen levothyroxine (SYNTHROID, LEVOTHROID) 50 MCG tablet   Oral   Take 50 mcg by mouth daily before breakfast.          . losartan (COZAAR) 100 MG tablet   Oral   Take 100 mg by mouth daily.         . nitroGLYCERIN (NITROSTAT) 0.4 MG SL tablet   Sublingual   Place 0.4 mg under the tongue every 5 (five) minutes as needed for chest pain.         Marland Kitchen omeprazole (PRILOSEC) 20 MG capsule   Oral   Take 20 mg by mouth daily.         . ranitidine (ZANTAC) 300 MG capsule   Oral   Take 300 mg by mouth every evening.         . warfarin (COUMADIN) 4 MG tablet   Oral   Take 2-4 mg by mouth daily. Takes 1 tablet (4mg ) every day except on Sunday takes 1/2 tablet (2mg )          BP 172/74  Pulse 65  Temp(Src) 97.7 F (36.5 C) (  Oral)  Resp 16  SpO2 96% Physical Exam  Nursing note and vitals  reviewed. Constitutional: He is oriented to person, place, and time. He appears well-developed and well-nourished. No distress.  HENT:  Head: Normocephalic and atraumatic.  Right Ear: External ear normal.  Left Ear: External ear normal.  Nose: Nose normal.  Eyes: Right eye exhibits no discharge. Left eye exhibits no discharge.  Neck: Neck supple.  Cardiovascular: Normal rate, regular rhythm, normal heart sounds and intact distal pulses.   Pulmonary/Chest: Effort normal. He exhibits no tenderness.  Abdominal: Soft. There is tenderness in the right lower quadrant. No hernia.  Musculoskeletal: He exhibits no edema.  Neurological: He is alert and oriented to person, place, and time.  Skin: Skin is warm and dry.    ED Course  Procedures (including critical care time) Labs Review Labs Reviewed  CBC WITH DIFFERENTIAL - Abnormal; Notable for the following:    HCT 37.0 (*)    RDW 20.3 (*)    Platelets 123 (*)    All other components within normal limits  COMPREHENSIVE METABOLIC PANEL - Abnormal; Notable for the following:    Creatinine, Ser 1.48 (*)    Albumin 3.3 (*)    GFR calc non Af Amer 43 (*)    GFR calc Af Amer 49 (*)    All other components within normal limits  PROTIME-INR - Abnormal; Notable for the following:    Prothrombin Time 27.5 (*)    INR 2.67 (*)    All other components within normal limits  LIPASE, BLOOD  LACTIC ACID, PLASMA  URINALYSIS, ROUTINE W REFLEX MICROSCOPIC  TROPONIN I    Date: 09/21/2012  Rate: 62  Rhythm: normal sinus rhythm  QRS Axis: left  Intervals: normal  ST/T Wave abnormalities: nonspecific ST/T changes  Conduction Disutrbances:left bundle branch block  Narrative Interpretation: LBBB  Old EKG Reviewed: unchanged   Imaging Review Ct Abdomen Pelvis W Contrast  09/21/2012   *RADIOLOGY REPORT*  Clinical Data: 79 year old male with abdominal and pelvic pain.  CT ABDOMEN AND PELVIS WITH CONTRAST  Technique:  Multidetector CT imaging of the  abdomen and pelvis was performed following the standard protocol during bolus administration of intravenous contrast.  Contrast: 80mL OMNIPAQUE IOHEXOL 300 MG/ML  SOLN  Comparison: 06/08/2012 and prior CTs dating back to 06/27/2009  Findings: Cardiomegaly is identified.  Mild bibasilar scarring again noted.  Small cyst within the left liver is again identified. The spleen, pancreas, gallbladder adrenal glands are unremarkable except for stable left adrenal adenomas. Bilateral renal cysts are again identified.  No free fluid, enlarged lymph nodes, biliary dilation or abdominal aortic aneurysm identified.  Scattered colonic diverticula are noted without evidence of diverticulitis.  There is no evidence of bowel obstruction or pneumoperitoneum.  Heavy aortic atherosclerotic calcifications are identified. The visualized celiac artery, SMA and IMA are patent. There is no evidence of small bowel wall thickening or dilatation.  Marked prostate enlargement is again noted. Mild bladder distention is unchanged. No acute or suspicious bony abnormalities are identified. Degenerative changes within the lumbar spine are again noted.  IMPRESSION: No evidence of acute abnormality.  Heavy aortic atherosclerotic plaque/calcification and feet are noted - the visualized celiac artery, SMA and IMA are patent without evidence of bowel abnormality.  Cardiomegaly, left adrenal adenomas and marked enlargement prostate gland again noted.   Original Report Authenticated By: Harmon Pier, M.D.    MDM   1. Chronic abdominal pain    Patient pain in his lower abdomen appears  to be chronic. On chart review there is numerous visits for the same type of abdominal pain. CT scan was evaluated as he was claiming it was worse with eating. The CT scan shows no acute abnormalities and has patent main arteries. Due to this with a stable exam, normal lactate and the above results I feel that mesenteric ischemia is low likelihood. He also has chronic  chest pain that occurs with his abdominal pain. As his chest pain has been going on for several days to weeks with an unchanged EKG and with a normal troponin I feel that ACS is of low likelihood. His hypertension resolved with pain medicine and time in the ED. I do not feel that he needs any more emergent treatment or evaluation and can followup as an outpatient with his PCP. I recommended because these pieces possible to close followup.    Audree Camel, MD 09/21/12 1534

## 2012-09-21 NOTE — ED Notes (Signed)
Pt brought to ED via [facility] with c/o of dizziness that has been ongoing for 7 months. New onset x3 days of feeling as though he will pass out.  Pt has no pain.

## 2012-09-21 NOTE — ED Notes (Signed)
Contacted CT about wait time for result.  Waiting on radiologist to read CT.

## 2012-09-28 ENCOUNTER — Encounter (HOSPITAL_COMMUNITY): Payer: Self-pay | Admitting: *Deleted

## 2012-09-28 ENCOUNTER — Emergency Department (HOSPITAL_COMMUNITY)
Admission: EM | Admit: 2012-09-28 | Discharge: 2012-09-28 | Disposition: A | Discharge status: Home / Self Care | Payer: Medicare Other | Attending: Emergency Medicine | Admitting: Emergency Medicine

## 2012-09-28 ENCOUNTER — Emergency Department (HOSPITAL_COMMUNITY): Payer: Medicare Other

## 2012-09-28 DIAGNOSIS — Z87448 Personal history of other diseases of urinary system: Secondary | ICD-10-CM | POA: Insufficient documentation

## 2012-09-28 DIAGNOSIS — H9190 Unspecified hearing loss, unspecified ear: Secondary | ICD-10-CM | POA: Insufficient documentation

## 2012-09-28 DIAGNOSIS — E785 Hyperlipidemia, unspecified: Secondary | ICD-10-CM | POA: Insufficient documentation

## 2012-09-28 DIAGNOSIS — K219 Gastro-esophageal reflux disease without esophagitis: Secondary | ICD-10-CM | POA: Insufficient documentation

## 2012-09-28 DIAGNOSIS — I252 Old myocardial infarction: Secondary | ICD-10-CM | POA: Insufficient documentation

## 2012-09-28 DIAGNOSIS — R0600 Dyspnea, unspecified: Secondary | ICD-10-CM

## 2012-09-28 DIAGNOSIS — Z8673 Personal history of transient ischemic attack (TIA), and cerebral infarction without residual deficits: Secondary | ICD-10-CM | POA: Insufficient documentation

## 2012-09-28 DIAGNOSIS — Z79899 Other long term (current) drug therapy: Secondary | ICD-10-CM | POA: Insufficient documentation

## 2012-09-28 DIAGNOSIS — E039 Hypothyroidism, unspecified: Secondary | ICD-10-CM | POA: Insufficient documentation

## 2012-09-28 DIAGNOSIS — Z8719 Personal history of other diseases of the digestive system: Secondary | ICD-10-CM | POA: Insufficient documentation

## 2012-09-28 DIAGNOSIS — E119 Type 2 diabetes mellitus without complications: Secondary | ICD-10-CM | POA: Insufficient documentation

## 2012-09-28 DIAGNOSIS — Z86711 Personal history of pulmonary embolism: Secondary | ICD-10-CM | POA: Insufficient documentation

## 2012-09-28 DIAGNOSIS — M129 Arthropathy, unspecified: Secondary | ICD-10-CM | POA: Insufficient documentation

## 2012-09-28 DIAGNOSIS — I209 Angina pectoris, unspecified: Secondary | ICD-10-CM | POA: Insufficient documentation

## 2012-09-28 DIAGNOSIS — J45901 Unspecified asthma with (acute) exacerbation: Secondary | ICD-10-CM | POA: Insufficient documentation

## 2012-09-28 DIAGNOSIS — Z8701 Personal history of pneumonia (recurrent): Secondary | ICD-10-CM | POA: Insufficient documentation

## 2012-09-28 DIAGNOSIS — F3289 Other specified depressive episodes: Secondary | ICD-10-CM | POA: Insufficient documentation

## 2012-09-28 DIAGNOSIS — Z8709 Personal history of other diseases of the respiratory system: Secondary | ICD-10-CM | POA: Insufficient documentation

## 2012-09-28 DIAGNOSIS — Z86718 Personal history of other venous thrombosis and embolism: Secondary | ICD-10-CM | POA: Insufficient documentation

## 2012-09-28 DIAGNOSIS — Z862 Personal history of diseases of the blood and blood-forming organs and certain disorders involving the immune mechanism: Secondary | ICD-10-CM | POA: Insufficient documentation

## 2012-09-28 DIAGNOSIS — I10 Essential (primary) hypertension: Secondary | ICD-10-CM | POA: Insufficient documentation

## 2012-09-28 DIAGNOSIS — Z8639 Personal history of other endocrine, nutritional and metabolic disease: Secondary | ICD-10-CM | POA: Insufficient documentation

## 2012-09-28 DIAGNOSIS — F411 Generalized anxiety disorder: Secondary | ICD-10-CM | POA: Insufficient documentation

## 2012-09-28 DIAGNOSIS — G8929 Other chronic pain: Secondary | ICD-10-CM | POA: Insufficient documentation

## 2012-09-28 DIAGNOSIS — M549 Dorsalgia, unspecified: Secondary | ICD-10-CM | POA: Insufficient documentation

## 2012-09-28 DIAGNOSIS — Z87891 Personal history of nicotine dependence: Secondary | ICD-10-CM | POA: Insufficient documentation

## 2012-09-28 DIAGNOSIS — H409 Unspecified glaucoma: Secondary | ICD-10-CM | POA: Insufficient documentation

## 2012-09-28 DIAGNOSIS — R109 Unspecified abdominal pain: Secondary | ICD-10-CM | POA: Insufficient documentation

## 2012-09-28 DIAGNOSIS — IMO0002 Reserved for concepts with insufficient information to code with codable children: Secondary | ICD-10-CM | POA: Insufficient documentation

## 2012-09-28 DIAGNOSIS — F329 Major depressive disorder, single episode, unspecified: Secondary | ICD-10-CM | POA: Insufficient documentation

## 2012-09-28 LAB — URINALYSIS, ROUTINE W REFLEX MICROSCOPIC
Bilirubin Urine: NEGATIVE
Glucose, UA: NEGATIVE mg/dL
Hgb urine dipstick: NEGATIVE
Ketones, ur: NEGATIVE mg/dL
Nitrite: NEGATIVE
Specific Gravity, Urine: 1.015 (ref 1.005–1.030)
pH: 6 (ref 5.0–8.0)

## 2012-09-28 LAB — CBC WITH DIFFERENTIAL/PLATELET
Eosinophils Absolute: 0.1 10*3/uL (ref 0.0–0.7)
HCT: 40 % (ref 39.0–52.0)
Hemoglobin: 13.9 g/dL (ref 13.0–17.0)
Lymphs Abs: 2.1 10*3/uL (ref 0.7–4.0)
MCH: 27.6 pg (ref 26.0–34.0)
MCHC: 34.8 g/dL (ref 30.0–36.0)
MCV: 79.5 fL (ref 78.0–100.0)
Monocytes Absolute: 0.6 10*3/uL (ref 0.1–1.0)
Monocytes Relative: 12 % (ref 3–12)
Neutrophils Relative %: 46 % (ref 43–77)
RBC: 5.03 MIL/uL (ref 4.22–5.81)

## 2012-09-28 LAB — COMPREHENSIVE METABOLIC PANEL
Alkaline Phosphatase: 87 U/L (ref 39–117)
BUN: 17 mg/dL (ref 6–23)
Creatinine, Ser: 1.68 mg/dL — ABNORMAL HIGH (ref 0.50–1.35)
GFR calc Af Amer: 42 mL/min — ABNORMAL LOW (ref >90)
Glucose, Bld: 96 mg/dL (ref 70–99)
Potassium: 3.3 mEq/L — ABNORMAL LOW (ref 3.5–5.1)
Total Bilirubin: 0.4 mg/dL (ref 0.3–1.2)
Total Protein: 7.2 g/dL (ref 6.0–8.3)

## 2012-09-28 LAB — POCT I-STAT TROPONIN I

## 2012-09-28 LAB — LIPASE, BLOOD: Lipase: 42 U/L (ref 11–59)

## 2012-09-28 LAB — D-DIMER, QUANTITATIVE: D-Dimer, Quant: <0.27 ug/mL-FEU (ref 0.00–0.48)

## 2012-09-28 MED ORDER — SODIUM CHLORIDE 0.9 % IV SOLN
INTRAVENOUS | Status: DC
  Administered 2012-09-28: 20 mL/h via INTRAVENOUS

## 2012-09-28 MED ORDER — ONDANSETRON HCL 4 MG/2ML IJ SOLN
4.0000 mg | Freq: Once | INTRAMUSCULAR | Status: AC
  Administered 2012-09-28: 4 mg via INTRAVENOUS
  Filled 2012-09-28: qty 2

## 2012-09-28 NOTE — ED Notes (Signed)
Pt waiting in room for family member to pick him up

## 2012-09-28 NOTE — ED Notes (Addendum)
Per EMS, pt live home alone. Abdominal pain onset, causing patient to wake up from sleep. Pt states he had hernia surgery  In March. Has constipation. Pt Got up this a.m to get ready for church and abdominal started back. He has been transported before and seemed to be more oriented in previous transports.

## 2012-09-28 NOTE — ED Provider Notes (Signed)
CSN: [id number]     Arrival date & time 09/28/12  1308 History   First MD Initiated Contact with Patient 09/28/12 0703     Chief Complaint  Patient presents with  . Abdominal Pain   (Consider location/radiation/quality/duration/timing/severity/associated sxs/prior Treatment) Patient is a 79 y.o. male presenting with abdominal pain. The history is provided by the patient.  Abdominal Pain  patient here complaining of abdominal pain as well as shortness of breath which woke him up from sleep this morning. Notes chronic nausea and vomiting from abd pain which according to the records has been chronic. Does have history of constipation. Seen here for similar symptoms 8 days ago and had CT scan of his abdomen which was negative. He denies any anginal type chest pain but has chronic chest pain which is no different than he normally has. Does have dizziness which is no different than his normal symptoms. Denies any severe headaches. No syncope or near-syncope. Denies any recent fever or cough. Dyspnea has resolved. He was unsure of what made it better or worse. No treatment used prior to arrival. Patient denies any urinary symptoms. No treatment used prior to arrival.  Past Medical History  Diagnosis Date  . GERD (gastroesophageal reflux disease)   . Hypothyroidism   . Chronic back pain   . Stroke   . Gout     no problems in 2 years  . Pulmonary embolism, bilateral 2010  . Hyperlipemia   . Myocardial infarction 1987; 1989  . Anginal pain   . DVT, lower extremity 1980's    LLE  . Asthma     "touch"  . Pneumonia 1970's  . Renal disorder ~ 03/2011    "wasn't functioning right"  . Arthritis     "mostly in my knees and shoulders"  . Stroke     "just a little one"  . Collapsed lung     "for 14 years" right   . Diabetes mellitus   . Hypertension   . Depression   . Anxiety   . Hearing loss   . Glaucoma   . Complication of anesthesia   . PONV (postoperative nausea and vomiting)    Past  Surgical History  Procedure Laterality Date  . Esophagogastroduodenoscopy  12/22/2010    Procedure: ESOPHAGOGASTRODUODENOSCOPY (EGD);  Surgeon: Theda Belfast;  Location: [facility];  Service: Endoscopy;  Laterality: N/A;  . Appendectomy  1950  . Cataract extraction w/ intraocular lens  implant, bilateral Bilateral 1990's  . Cardiac catheterization    . Coronary angioplasty with stent placement      "think I have 4 total"  . Inguinal hernia repair Bilateral 03/28/2012    Procedure: LAPAROSCOPIC BILATERAL INGUINAL HERNIA REPAIR;  Surgeon: Ardeth Sportsman, MD;  Location: [facility];  Service: General;  Laterality: Bilateral;  . Insertion of mesh Bilateral 03/28/2012    Procedure: INSERTION OF MESH;  Surgeon: Ardeth Sportsman, MD;  Location: [facility];  Service: General;  Laterality: Bilateral;  . Hernia repair    . Esophagogastroduodenoscopy N/A 04/30/2012    Procedure: ESOPHAGOGASTRODUODENOSCOPY (EGD);  Surgeon: Theda Belfast, MD;  Location: [facility] ENDOSCOPY;  Service: Endoscopy;  Laterality: N/A;  . Colonoscopy N/A 05/01/2012    Procedure: COLONOSCOPY;  Surgeon: Theda Belfast, MD;  Location: [facility]COPY;  Service: Endoscopy;  Laterality: N/A;   Family History  Problem Relation Age of Onset  . Diabetes Mother   . Heart disease Father   . Lung cancer Brother   . Stroke Neg Hx   .  Hypertension Neg Hx   . Hyperlipidemia Neg Hx   . Alcohol abuse Neg Hx    History  Substance Use Topics  . Smoking status: Former Smoker -- 2.00 packs/day for 3 years    Types: Cigarettes    Start date: 01/27/1946    Quit date: 01/22/1981  . Smokeless tobacco: Never Used  . Alcohol Use: No     Comment: 07/12/2011; "last drink of alcohol was ~ 1983"    Review of Systems  Gastrointestinal: Positive for abdominal pain.  All other systems reviewed and are negative.    Allergies  Review of patient's allergies indicates no known allergies.  Home Medications   Current Outpatient Rx  Name  Route  Sig  Dispense   Refill  . albuterol (PROVENTIL HFA;VENTOLIN HFA) 108 (90 BASE) MCG/ACT inhaler   Inhalation   Inhale 2 puffs into the lungs every 6 (six) hours as needed for shortness of breath.          Marland Kitchen amiodarone (PACERONE) 200 MG tablet   Oral   Take 200 mg by mouth daily.         Marland Kitchen atorvastatin (LIPITOR) 40 MG tablet   Oral   Take 40 mg by mouth daily.         . beclomethasone (BECONASE-AQ) 42 MCG/SPRAY nasal spray   Nasal   Place 1 spray into the nose 2 (two) times daily. Dose is for each nostril.         . carvedilol (COREG) 3.125 MG tablet   Oral   Take 3.125 mg by mouth 2 (two) times daily with a meal.         . ferrous sulfate 325 (65 FE) MG tablet   Oral   Take 325 mg by mouth 3 (three) times daily with meals.         . furosemide (LASIX) 40 MG tablet   Oral   Take 40 mg by mouth daily.          Marland Kitchen glimepiride (AMARYL) 1 MG tablet   Oral   Take 1 mg by mouth daily before breakfast.         . isosorbide mononitrate (IMDUR) 60 MG 24 hr tablet   Oral   Take 60 mg by mouth every morning.          Marland Kitchen levothyroxine (SYNTHROID, LEVOTHROID) 50 MCG tablet   Oral   Take 50 mcg by mouth daily before breakfast.          . losartan (COZAAR) 100 MG tablet   Oral   Take 100 mg by mouth daily.         . nitroGLYCERIN (NITROSTAT) 0.4 MG SL tablet   Sublingual   Place 0.4 mg under the tongue every 5 (five) minutes as needed for chest pain.         Marland Kitchen omeprazole (PRILOSEC) 20 MG capsule   Oral   Take 20 mg by mouth daily.         . ranitidine (ZANTAC) 300 MG capsule   Oral   Take 300 mg by mouth every evening.         . warfarin (COUMADIN) 4 MG tablet   Oral   Take 2-4 mg by mouth daily. Takes 1 tablet (4mg ) every day except on Sunday takes 1/2 tablet (2mg )          There were no vitals taken for this visit. Physical Exam  Nursing note and vitals reviewed. Constitutional: He is oriented  to person, place, and time. He appears well-developed and  well-nourished.  Non-toxic appearance. No distress.  HENT:  Head: Normocephalic and atraumatic.  Eyes: Conjunctivae, EOM and lids are normal. Pupils are equal, round, and reactive to light.  Neck: Normal range of motion. Neck supple. No tracheal deviation present. No mass present.  Cardiovascular: Normal rate, regular rhythm and normal heart sounds.  Exam reveals no gallop.   No murmur heard. Pulmonary/Chest: Effort normal and breath sounds normal. No stridor. No respiratory distress. He has no decreased breath sounds. He has no wheezes. He has no rhonchi. He has no rales.  Abdominal: Soft. Normal appearance and bowel sounds are normal. He exhibits no distension. There is no tenderness. There is no rigidity, no rebound, no guarding and no CVA tenderness.  Musculoskeletal: Normal range of motion. He exhibits no edema and no tenderness.  Neurological: He is alert and oriented to person, place, and time. He has normal strength. No cranial nerve deficit or sensory deficit. GCS eye subscore is 4. GCS verbal subscore is 5. GCS motor subscore is 6.  Skin: Skin is warm and dry. No abrasion and no rash noted.  Psychiatric: He has a normal mood and affect. His speech is normal and behavior is normal.    ED Course  Procedures (including critical care time) Labs Review Labs Reviewed  URINALYSIS, ROUTINE W REFLEX MICROSCOPIC  CBC WITH DIFFERENTIAL  COMPREHENSIVE METABOLIC PANEL  LIPASE, BLOOD   Imaging Review No results found.  MDM  No diagnosis found.   Date: 09/28/2012  Rate: 62  Rhythm: normal sinus rhythm  QRS Axis: normal  Intervals: normal  ST/T Wave abnormalities: nonspecific ST changes  Conduction Disutrbances:left bundle branch block  Narrative Interpretation:   Old EKG Reviewed: unchanged  Patient with negative evaluation here. No signs of pulmonary embolism for ACS. Urinalysis is negative. Acute abdominal series shows no signs of bowel obstruction. Patient has a non-surgical  abdomen. Patient's current symptoms have been chronic in nature and he was encouraged to followup with his Dr. next week   Toy Baker, MD 09/28/12 [phone]

## 2012-09-28 NOTE — ED Notes (Signed)
Bed: WA21 Expected date:  Expected time:  Means of arrival:  Comments: EMS/79 yo male-abdominal pain/hernia surgery in March with continued pain

## 2012-10-18 ENCOUNTER — Emergency Department (HOSPITAL_COMMUNITY)
Admission: EM | Admit: 2012-10-18 | Discharge: 2012-10-18 | Disposition: A | Discharge status: Home / Self Care | Payer: Medicare Other | Attending: Emergency Medicine | Admitting: Emergency Medicine

## 2012-10-18 ENCOUNTER — Encounter (HOSPITAL_COMMUNITY): Payer: Self-pay | Admitting: Emergency Medicine

## 2012-10-18 ENCOUNTER — Emergency Department (HOSPITAL_COMMUNITY): Payer: Medicare Other

## 2012-10-18 DIAGNOSIS — Z86711 Personal history of pulmonary embolism: Secondary | ICD-10-CM | POA: Insufficient documentation

## 2012-10-18 DIAGNOSIS — M19019 Primary osteoarthritis, unspecified shoulder: Secondary | ICD-10-CM | POA: Insufficient documentation

## 2012-10-18 DIAGNOSIS — IMO0002 Reserved for concepts with insufficient information to code with codable children: Secondary | ICD-10-CM | POA: Insufficient documentation

## 2012-10-18 DIAGNOSIS — H9190 Unspecified hearing loss, unspecified ear: Secondary | ICD-10-CM | POA: Insufficient documentation

## 2012-10-18 DIAGNOSIS — Z9861 Coronary angioplasty status: Secondary | ICD-10-CM | POA: Insufficient documentation

## 2012-10-18 DIAGNOSIS — E785 Hyperlipidemia, unspecified: Secondary | ICD-10-CM | POA: Insufficient documentation

## 2012-10-18 DIAGNOSIS — F329 Major depressive disorder, single episode, unspecified: Secondary | ICD-10-CM | POA: Insufficient documentation

## 2012-10-18 DIAGNOSIS — J45909 Unspecified asthma, uncomplicated: Secondary | ICD-10-CM | POA: Insufficient documentation

## 2012-10-18 DIAGNOSIS — Z7901 Long term (current) use of anticoagulants: Secondary | ICD-10-CM | POA: Insufficient documentation

## 2012-10-18 DIAGNOSIS — Z79899 Other long term (current) drug therapy: Secondary | ICD-10-CM | POA: Insufficient documentation

## 2012-10-18 DIAGNOSIS — R112 Nausea with vomiting, unspecified: Secondary | ICD-10-CM | POA: Insufficient documentation

## 2012-10-18 DIAGNOSIS — I10 Essential (primary) hypertension: Secondary | ICD-10-CM | POA: Insufficient documentation

## 2012-10-18 DIAGNOSIS — M549 Dorsalgia, unspecified: Secondary | ICD-10-CM | POA: Insufficient documentation

## 2012-10-18 DIAGNOSIS — R079 Chest pain, unspecified: Secondary | ICD-10-CM | POA: Insufficient documentation

## 2012-10-18 DIAGNOSIS — F411 Generalized anxiety disorder: Secondary | ICD-10-CM | POA: Insufficient documentation

## 2012-10-18 DIAGNOSIS — Z87891 Personal history of nicotine dependence: Secondary | ICD-10-CM | POA: Insufficient documentation

## 2012-10-18 DIAGNOSIS — G8929 Other chronic pain: Secondary | ICD-10-CM | POA: Insufficient documentation

## 2012-10-18 DIAGNOSIS — I252 Old myocardial infarction: Secondary | ICD-10-CM | POA: Insufficient documentation

## 2012-10-18 DIAGNOSIS — R109 Unspecified abdominal pain: Secondary | ICD-10-CM | POA: Insufficient documentation

## 2012-10-18 DIAGNOSIS — E039 Hypothyroidism, unspecified: Secondary | ICD-10-CM | POA: Insufficient documentation

## 2012-10-18 DIAGNOSIS — Z8673 Personal history of transient ischemic attack (TIA), and cerebral infarction without residual deficits: Secondary | ICD-10-CM | POA: Insufficient documentation

## 2012-10-18 DIAGNOSIS — I209 Angina pectoris, unspecified: Secondary | ICD-10-CM | POA: Insufficient documentation

## 2012-10-18 DIAGNOSIS — E119 Type 2 diabetes mellitus without complications: Secondary | ICD-10-CM | POA: Insufficient documentation

## 2012-10-18 DIAGNOSIS — K922 Gastrointestinal hemorrhage, unspecified: Secondary | ICD-10-CM

## 2012-10-18 DIAGNOSIS — H409 Unspecified glaucoma: Secondary | ICD-10-CM | POA: Insufficient documentation

## 2012-10-18 DIAGNOSIS — Z86718 Personal history of other venous thrombosis and embolism: Secondary | ICD-10-CM | POA: Insufficient documentation

## 2012-10-18 DIAGNOSIS — F3289 Other specified depressive episodes: Secondary | ICD-10-CM | POA: Insufficient documentation

## 2012-10-18 DIAGNOSIS — M1710 Unilateral primary osteoarthritis, unspecified knee: Secondary | ICD-10-CM | POA: Insufficient documentation

## 2012-10-18 DIAGNOSIS — K5900 Constipation, unspecified: Secondary | ICD-10-CM | POA: Insufficient documentation

## 2012-10-18 DIAGNOSIS — Z8701 Personal history of pneumonia (recurrent): Secondary | ICD-10-CM | POA: Insufficient documentation

## 2012-10-18 LAB — CBC WITH DIFFERENTIAL/PLATELET
Basophils Absolute: 0 10*3/uL (ref 0.0–0.1)
Basophils Relative: 0 % (ref 0–1)
Eosinophils Relative: 1 % (ref 0–5)
HCT: 36.7 % — ABNORMAL LOW (ref 39.0–52.0)
MCHC: 35.4 g/dL (ref 30.0–36.0)
MCV: 80.5 fL (ref 78.0–100.0)
Monocytes Absolute: 0.5 10*3/uL (ref 0.1–1.0)
Platelets: 131 10*3/uL — ABNORMAL LOW (ref 150–400)
RDW: 18.4 % — ABNORMAL HIGH (ref 11.5–15.5)

## 2012-10-18 LAB — BASIC METABOLIC PANEL
Calcium: 9 mg/dL (ref 8.4–10.5)
Creatinine, Ser: 1.45 mg/dL — ABNORMAL HIGH (ref 0.50–1.35)
GFR calc Af Amer: 51 mL/min — ABNORMAL LOW (ref >90)
GFR calc non Af Amer: 44 mL/min — ABNORMAL LOW (ref >90)

## 2012-10-18 LAB — PROTIME-INR: Prothrombin Time: 26.6 seconds — ABNORMAL HIGH (ref 11.6–15.2)

## 2012-10-18 MED ORDER — ONDANSETRON HCL 4 MG/2ML IJ SOLN
4.0000 mg | Freq: Once | INTRAMUSCULAR | Status: AC
  Administered 2012-10-18: 4 mg via INTRAVENOUS
  Filled 2012-10-18: qty 2

## 2012-10-18 NOTE — ED Notes (Signed)
Pt to ED via EMS with c/o epigastric pain that radiates to left chest with nausea and vomiting, onset yesterday before dinner. Pt states had bowl movent today with dark stool. EMS given 4mg  zofran IV. Hx of constipation, no constipation for last 7months. BP-174/80, HR-70, SpO-99% on room air.

## 2012-10-18 NOTE — ED Provider Notes (Signed)
CSN: [id number]     Arrival date & time 10/18/12  1116 History   First MD Initiated Contact with Patient 10/18/12 1117     Chief Complaint  Patient presents with  . Chest Pain   (Consider location/radiation/quality/duration/timing/severity/associated sxs/prior Treatment) Patient is a 79 y.o. male presenting with chest pain. The history is provided by the patient.  Chest Pain Associated symptoms: abdominal pain, nausea and vomiting   Associated symptoms: no back pain, no headache, no numbness, no shortness of breath and no weakness    patient presents with lower abdominal pain. He states he eats something it goes from his lower abdomen up to his chest. His been having pain like this for the last 7 months. He states is more severe today. This had nausea vomiting. He's had constipation. He is on Coumadin for pulmonary embolisms. He states he's had black stools but states he is on iron also. No fevers. No cough. Shortness of breath. He states is the typical pain that he has had over the last 7 months. He states that it does not feel like his previous heart problems when he had the stents.  Past Medical History  Diagnosis Date  . GERD (gastroesophageal reflux disease)   . Hypothyroidism   . Chronic back pain   . Stroke   . Gout     no problems in 2 years  . Pulmonary embolism, bilateral 2010  . Hyperlipemia   . Myocardial infarction 1987; 1989  . Anginal pain   . DVT, lower extremity 1980's    LLE  . Asthma     "touch"  . Pneumonia 1970's  . Renal disorder ~ 03/2011    "wasn't functioning right"  . Arthritis     "mostly in my knees and shoulders"  . Stroke     "just a little one"  . Collapsed lung     "for 14 years" right   . Diabetes mellitus   . Hypertension   . Depression   . Anxiety   . Hearing loss   . Glaucoma   . Complication of anesthesia   . PONV (postoperative nausea and vomiting)    Past Surgical History  Procedure Laterality Date  . Esophagogastroduodenoscopy   12/22/2010    Procedure: ESOPHAGOGASTRODUODENOSCOPY (EGD);  Surgeon: Theda Belfast;  Location: [facility];  Service: Endoscopy;  Laterality: N/A;  . Appendectomy  1950  . Cataract extraction w/ intraocular lens  implant, bilateral Bilateral 1990's  . Cardiac catheterization    . Coronary angioplasty with stent placement      "think I have 4 total"  . Inguinal hernia repair Bilateral 03/28/2012    Procedure: LAPAROSCOPIC BILATERAL INGUINAL HERNIA REPAIR;  Surgeon: Ardeth Sportsman, MD;  Location: [facility];  Service: General;  Laterality: Bilateral;  . Insertion of mesh Bilateral 03/28/2012    Procedure: INSERTION OF MESH;  Surgeon: Ardeth Sportsman, MD;  Location: [facility];  Service: General;  Laterality: Bilateral;  . Hernia repair    . Esophagogastroduodenoscopy N/A 04/30/2012    Procedure: ESOPHAGOGASTRODUODENOSCOPY (EGD);  Surgeon: Theda Belfast, MD;  Location: [facility] ENDOSCOPY;  Service: Endoscopy;  Laterality: N/A;  . Colonoscopy N/A 05/01/2012    Procedure: COLONOSCOPY;  Surgeon: Theda Belfast, MD;  Location: [facility] ENDOSCOPY;  Service: Endoscopy;  Laterality: N/A;   Family History  Problem Relation Age of Onset  . Diabetes Mother   . Heart disease Father   . Lung cancer Brother   . Stroke Neg Hx   .  Hypertension Neg Hx   . Hyperlipidemia Neg Hx   . Alcohol abuse Neg Hx    History  Substance Use Topics  . Smoking status: Former Smoker -- 2.00 packs/day for 3 years    Types: Cigarettes    Start date: 01/27/1946    Quit date: 01/22/1981  . Smokeless tobacco: Never Used  . Alcohol Use: No     Comment: 07/12/2011; "last drink of alcohol was ~ 1983"    Review of Systems  Constitutional: Negative for activity change and appetite change.  HENT: Negative for neck stiffness.   Eyes: Negative for pain.  Respiratory: Negative for chest tightness and shortness of breath.   Cardiovascular: Positive for chest pain. Negative for leg swelling.  Gastrointestinal: Positive for nausea, vomiting,  abdominal pain and constipation. Negative for diarrhea.  Genitourinary: Negative for flank pain.  Musculoskeletal: Negative for back pain.  Skin: Negative for rash.  Neurological: Negative for weakness, numbness and headaches.  Psychiatric/Behavioral: Negative for behavioral problems.    Allergies  Review of patient's allergies indicates no known allergies.  Home Medications   Current Outpatient Rx  Name  Route  Sig  Dispense  Refill  . acetaminophen (TYLENOL) 500 MG tablet   Oral   Take 1,000 mg by mouth every 6 (six) hours as needed for pain.         Marland Kitchen albuterol (PROVENTIL HFA;VENTOLIN HFA) 108 (90 BASE) MCG/ACT inhaler   Inhalation   Inhale 2 puffs into the lungs every 6 (six) hours as needed for shortness of breath.          Marland Kitchen alum & mag hydroxide-simeth (MAALOX/MYLANTA) 200-200-20 MG/5ML suspension   Oral   Take 10 mLs by mouth every 6 (six) hours as needed for indigestion.         Marland Kitchen amiodarone (PACERONE) 200 MG tablet   Oral   Take 200 mg by mouth daily.         Marland Kitchen atorvastatin (LIPITOR) 40 MG tablet   Oral   Take 40 mg by mouth daily.         . beclomethasone (BECONASE-AQ) 42 MCG/SPRAY nasal spray   Nasal   Place 1 spray into the nose 2 (two) times daily. Dose is for each nostril.         . carvedilol (COREG) 3.125 MG tablet   Oral   Take 3.125 mg by mouth 2 (two) times daily with a meal.         . dicyclomine (BENTYL) 10 MG capsule   Oral   Take 10 mg by mouth 3 (three) times daily before meals.         . ferrous sulfate 325 (65 FE) MG tablet   Oral   Take 325 mg by mouth 3 (three) times daily with meals.         . furosemide (LASIX) 40 MG tablet   Oral   Take 40 mg by mouth daily.          Marland Kitchen glimepiride (AMARYL) 1 MG tablet   Oral   Take 1 mg by mouth daily before breakfast.         . levothyroxine (SYNTHROID, LEVOTHROID) 50 MCG tablet   Oral   Take 50 mcg by mouth daily before breakfast.          . losartan (COZAAR) 100  MG tablet   Oral   Take 100 mg by mouth daily.         . nitroGLYCERIN (NITROSTAT) 0.4 MG  SL tablet   Sublingual   Place 0.4 mg under the tongue every 5 (five) minutes as needed for chest pain.         Marland Kitchen omeprazole (PRILOSEC) 20 MG capsule   Oral   Take 20 mg by mouth daily.         . ranitidine (ZANTAC) 300 MG capsule   Oral   Take 300 mg by mouth every evening.         . warfarin (COUMADIN) 4 MG tablet   Oral   Take 2-4 mg by mouth daily. Takes 1 tablet (4mg ) every day except on Sunday takes 1/2 tablet (2mg )          BP 190/72  Pulse 52  Temp(Src) 97.7 F (36.5 C) (Oral)  Resp 18  SpO2 97% Physical Exam  Nursing note and vitals reviewed. Constitutional: He is oriented to person, place, and time. He appears well-developed and well-nourished.  HENT:  Head: Normocephalic and atraumatic.  Eyes: EOM are normal. Pupils are equal, round, and reactive to light.  Neck: Normal range of motion. Neck supple.  Cardiovascular: Normal rate, regular rhythm and normal heart sounds.   No murmur heard. Pulmonary/Chest: Effort normal and breath sounds normal.  Abdominal: Soft. Bowel sounds are normal. He exhibits no distension and no mass. There is tenderness. There is no rebound and no guarding.  Minimal abdominal tenderness. Worse in the periUmbilical area.  Musculoskeletal: Normal range of motion. He exhibits no edema.  Neurological: He is alert and oriented to person, place, and time. No cranial nerve deficit.  Skin: Skin is warm and dry.  Psychiatric: He has a normal mood and affect.    ED Course  Procedures (including critical care time) Labs Review Labs Reviewed  CBC WITH DIFFERENTIAL - Abnormal; Notable for the following:    HCT 36.7 (*)    RDW 18.4 (*)    Platelets 131 (*)    All other components within normal limits  BASIC METABOLIC PANEL - Abnormal; Notable for the following:    Glucose, Bld 117 (*)    Creatinine, Ser 1.45 (*)    GFR calc non Af Amer 44 (*)     GFR calc Af Amer 51 (*)    All other components within normal limits  PROTIME-INR - Abnormal; Notable for the following:    Prothrombin Time 26.6 (*)    INR 2.55 (*)    All other components within normal limits  OCCULT BLOOD, POC DEVICE - Abnormal; Notable for the following:    Fecal Occult Bld POSITIVE (*)    All other components within normal limits   Imaging Review Dg Abd Acute W/chest  10/18/2012   *RADIOLOGY REPORT*  Clinical Data: Abdominal pain, chest pain  ACUTE ABDOMEN SERIES (ABDOMEN 2 VIEW & CHEST 1 VIEW)  Comparison: CT abdomen/pelvis 09/22/2046; chest x-ray 09/28/2012  Findings: Stable cardiomegaly with left atrial enlargement. Atherosclerotic calcifications noted in the transverse aorta. Stable chronic central bronchitic changes and interstitial prominence.  No overt pulmonary edema, pleural effusion or focal airspace consolidation.  No pneumothorax.  No free air on the upright view.  Nonobstructed bowel gas pattern.  Gas noted throughout the colon to the level of the rectum.  No large ascites. No organomegaly.  Rotary dextroconvex scoliosis of the lumbar spine centered at L2-L3 with associated multilevel degenerative changes.  IMPRESSION:  1.  No acute cardiopulmonary disease. 2.  Nonobstructed bowel gas pattern. 3.  Cardiomegaly with left atrial enlargement. 4.  Atherosclerosis 5.  Stable  central bronchitic changes and interstitial prominence suggesting underlying COPD.   Original Report Authenticated By: Malachy Moan, M.D.    Date: 10/19/2012  Rate: 61  Rhythm: normal sinus rhythm  QRS Axis: normal  Intervals: normal  ST/T Wave abnormalities: early repolarization  Conduction Disutrbances:left bundle branch block  Narrative Interpretation: LBBB and LVH with repol  Old EKG Reviewed: unchanged   MDM   1. Chronic abdominal pain   2. Chest pain   3. GI bleed    Patient with chronic chest and abdominal pain. Is on Coumadin for previous PEs. Found to be guaiac  positive. His hemoglobin appears stable. Is not tachycardic. Discuss with PCP and will see him in the morning on monday    Jackqueline Aquilar R. Rubin Payor, MD 10/19/12 2009

## 2012-11-09 ENCOUNTER — Observation Stay (HOSPITAL_COMMUNITY)
Admission: EM | Admit: 2012-11-09 | Discharge: 2012-11-10 | Disposition: A | Discharge status: Home / Self Care | Payer: Medicare Other | Attending: Cardiology | Admitting: Cardiology

## 2012-11-09 ENCOUNTER — Emergency Department (HOSPITAL_COMMUNITY): Payer: Medicare Other

## 2012-11-09 ENCOUNTER — Encounter (HOSPITAL_COMMUNITY): Payer: Self-pay | Admitting: Emergency Medicine

## 2012-11-09 DIAGNOSIS — R002 Palpitations: Secondary | ICD-10-CM | POA: Insufficient documentation

## 2012-11-09 DIAGNOSIS — K921 Melena: Secondary | ICD-10-CM | POA: Insufficient documentation

## 2012-11-09 DIAGNOSIS — M109 Gout, unspecified: Secondary | ICD-10-CM | POA: Insufficient documentation

## 2012-11-09 DIAGNOSIS — I10 Essential (primary) hypertension: Secondary | ICD-10-CM | POA: Insufficient documentation

## 2012-11-09 DIAGNOSIS — R110 Nausea: Secondary | ICD-10-CM | POA: Insufficient documentation

## 2012-11-09 DIAGNOSIS — F411 Generalized anxiety disorder: Secondary | ICD-10-CM | POA: Insufficient documentation

## 2012-11-09 DIAGNOSIS — J9819 Other pulmonary collapse: Secondary | ICD-10-CM | POA: Insufficient documentation

## 2012-11-09 DIAGNOSIS — Z8673 Personal history of transient ischemic attack (TIA), and cerebral infarction without residual deficits: Secondary | ICD-10-CM | POA: Insufficient documentation

## 2012-11-09 DIAGNOSIS — G8929 Other chronic pain: Secondary | ICD-10-CM | POA: Insufficient documentation

## 2012-11-09 DIAGNOSIS — Z86711 Personal history of pulmonary embolism: Secondary | ICD-10-CM | POA: Insufficient documentation

## 2012-11-09 DIAGNOSIS — E039 Hypothyroidism, unspecified: Secondary | ICD-10-CM | POA: Insufficient documentation

## 2012-11-09 DIAGNOSIS — N289 Disorder of kidney and ureter, unspecified: Secondary | ICD-10-CM | POA: Insufficient documentation

## 2012-11-09 DIAGNOSIS — Z79899 Other long term (current) drug therapy: Secondary | ICD-10-CM | POA: Insufficient documentation

## 2012-11-09 DIAGNOSIS — Z23 Encounter for immunization: Secondary | ICD-10-CM | POA: Insufficient documentation

## 2012-11-09 DIAGNOSIS — E119 Type 2 diabetes mellitus without complications: Secondary | ICD-10-CM | POA: Insufficient documentation

## 2012-11-09 DIAGNOSIS — Z8701 Personal history of pneumonia (recurrent): Secondary | ICD-10-CM | POA: Insufficient documentation

## 2012-11-09 DIAGNOSIS — H9190 Unspecified hearing loss, unspecified ear: Secondary | ICD-10-CM | POA: Insufficient documentation

## 2012-11-09 DIAGNOSIS — K219 Gastro-esophageal reflux disease without esophagitis: Secondary | ICD-10-CM | POA: Insufficient documentation

## 2012-11-09 DIAGNOSIS — I209 Angina pectoris, unspecified: Secondary | ICD-10-CM | POA: Insufficient documentation

## 2012-11-09 DIAGNOSIS — R5381 Other malaise: Secondary | ICD-10-CM | POA: Insufficient documentation

## 2012-11-09 DIAGNOSIS — R197 Diarrhea, unspecified: Secondary | ICD-10-CM | POA: Insufficient documentation

## 2012-11-09 DIAGNOSIS — J45909 Unspecified asthma, uncomplicated: Secondary | ICD-10-CM | POA: Insufficient documentation

## 2012-11-09 DIAGNOSIS — Z87891 Personal history of nicotine dependence: Secondary | ICD-10-CM | POA: Insufficient documentation

## 2012-11-09 DIAGNOSIS — R079 Chest pain, unspecified: Principal | ICD-10-CM | POA: Insufficient documentation

## 2012-11-09 DIAGNOSIS — H409 Unspecified glaucoma: Secondary | ICD-10-CM | POA: Insufficient documentation

## 2012-11-09 DIAGNOSIS — E785 Hyperlipidemia, unspecified: Secondary | ICD-10-CM | POA: Insufficient documentation

## 2012-11-09 DIAGNOSIS — F329 Major depressive disorder, single episode, unspecified: Secondary | ICD-10-CM | POA: Insufficient documentation

## 2012-11-09 DIAGNOSIS — Z7901 Long term (current) use of anticoagulants: Secondary | ICD-10-CM | POA: Insufficient documentation

## 2012-11-09 DIAGNOSIS — M129 Arthropathy, unspecified: Secondary | ICD-10-CM | POA: Insufficient documentation

## 2012-11-09 DIAGNOSIS — R109 Unspecified abdominal pain: Secondary | ICD-10-CM | POA: Insufficient documentation

## 2012-11-09 DIAGNOSIS — I252 Old myocardial infarction: Secondary | ICD-10-CM | POA: Insufficient documentation

## 2012-11-09 DIAGNOSIS — M2550 Pain in unspecified joint: Secondary | ICD-10-CM | POA: Insufficient documentation

## 2012-11-09 DIAGNOSIS — Z86718 Personal history of other venous thrombosis and embolism: Secondary | ICD-10-CM | POA: Insufficient documentation

## 2012-11-09 DIAGNOSIS — F3289 Other specified depressive episodes: Secondary | ICD-10-CM | POA: Insufficient documentation

## 2012-11-09 LAB — CBC WITH DIFFERENTIAL/PLATELET
Basophils Absolute: 0 10*3/uL (ref 0.0–0.1)
Basophils Relative: 0 % (ref 0–1)
Basophils Relative: 0 % (ref 0–1)
Eosinophils Absolute: 0.1 10*3/uL (ref 0.0–0.7)
Eosinophils Absolute: 0 10*3/uL (ref 0.0–0.7)
Eosinophils Relative: 1 % (ref 0–5)
Eosinophils Relative: 0 % (ref 0–5)
HCT: 39.1 % (ref 39.0–52.0)
Hemoglobin: 13.9 g/dL (ref 13.0–17.0)
Lymphocytes Relative: 32 % (ref 12–46)
Lymphs Abs: 2.1 10*3/uL (ref 0.7–4.0)
MCH: 29.3 pg (ref 26.0–34.0)
MCH: 29.1 pg (ref 26.0–34.0)
MCHC: 35.9 g/dL (ref 30.0–36.0)
MCHC: 35.5 g/dL (ref 30.0–36.0)
MCV: 81.7 fL (ref 78.0–100.0)
MCV: 82 fL (ref 78.0–100.0)
Monocytes Absolute: 0.6 10*3/uL (ref 0.1–1.0)
Monocytes Relative: 10 % (ref 3–12)
Platelets: 149 10*3/uL — ABNORMAL LOW (ref 150–400)
RBC: 4.77 MIL/uL (ref 4.22–5.81)
RDW: 16.3 % — ABNORMAL HIGH (ref 11.5–15.5)
WBC: 6.5 10*3/uL (ref 4.0–10.5)
WBC: 6.7 10*3/uL (ref 4.0–10.5)

## 2012-11-09 LAB — COMPREHENSIVE METABOLIC PANEL
ALT: 26 U/L (ref 0–53)
AST: 31 U/L (ref 0–37)
Albumin: 3.7 g/dL (ref 3.5–5.2)
Alkaline Phosphatase: 99 U/L (ref 39–117)
CO2: 28 mEq/L (ref 19–32)
Calcium: 9.3 mg/dL (ref 8.4–10.5)
Creatinine, Ser: 1.53 mg/dL — ABNORMAL HIGH (ref 0.50–1.35)
GFR calc non Af Amer: 41 mL/min — ABNORMAL LOW (ref >90)
Sodium: 141 mEq/L (ref 135–145)
Total Protein: 7.7 g/dL (ref 6.0–8.3)

## 2012-11-09 LAB — TROPONIN I
Troponin I: <0.3 ng/mL (ref <0.30)
Troponin I: <0.3 ng/mL (ref <0.30)

## 2012-11-09 LAB — GLUCOSE, CAPILLARY: Glucose-Capillary: 113 mg/dL — ABNORMAL HIGH (ref 70–99)

## 2012-11-09 LAB — PROTIME-INR: Prothrombin Time: 22.6 seconds — ABNORMAL HIGH (ref 11.6–15.2)

## 2012-11-09 LAB — OCCULT BLOOD, POC DEVICE: Fecal Occult Bld: NEGATIVE

## 2012-11-09 LAB — CG4 I-STAT (LACTIC ACID): Lactic Acid, Venous: 0.89 mmol/L (ref 0.5–2.2)

## 2012-11-09 MED ORDER — LOSARTAN POTASSIUM 50 MG PO TABS
100.0000 mg | ORAL_TABLET | Freq: Every day | ORAL | Status: DC
  Administered 2012-11-09 – 2012-11-10 (×2): 100 mg via ORAL
  Filled 2012-11-09 (×2): qty 2

## 2012-11-09 MED ORDER — DICYCLOMINE HCL 10 MG PO CAPS
10.0000 mg | ORAL_CAPSULE | Freq: Three times a day (TID) | ORAL | Status: DC
  Administered 2012-11-09 – 2012-11-10 (×2): 10 mg via ORAL
  Filled 2012-11-09 (×5): qty 1

## 2012-11-09 MED ORDER — NITROGLYCERIN 0.4 MG SL SUBL: 0.4000 mg | SUBLINGUAL_TABLET | SUBLINGUAL | Status: DC | PRN

## 2012-11-09 MED ORDER — ACETAMINOPHEN 325 MG PO TABS: 650.0000 mg | ORAL_TABLET | ORAL | Status: DC | PRN

## 2012-11-09 MED ORDER — AMIODARONE HCL 200 MG PO TABS
200.0000 mg | ORAL_TABLET | Freq: Every day | ORAL | Status: DC
  Administered 2012-11-09 – 2012-11-10 (×2): 200 mg via ORAL
  Filled 2012-11-09 (×2): qty 1

## 2012-11-09 MED ORDER — NITROGLYCERIN 0.4 MG SL SUBL
0.4000 mg | SUBLINGUAL_TABLET | SUBLINGUAL | Status: AC | PRN
  Administered 2012-11-09 (×3): 0.4 mg via SUBLINGUAL

## 2012-11-09 MED ORDER — INFLUENZA VAC SPLIT QUAD 0.5 ML IM SUSP
0.5000 mL | INTRAMUSCULAR | Status: AC
  Administered 2012-11-10: 0.5 mL via INTRAMUSCULAR
  Filled 2012-11-09: qty 0.5

## 2012-11-09 MED ORDER — SODIUM CHLORIDE 0.9 % IV SOLN
INTRAVENOUS | Status: DC
  Administered 2012-11-09: 20 mL via INTRAVENOUS

## 2012-11-09 MED ORDER — ACETAMINOPHEN 325 MG PO TABS: 650.0000 mg | ORAL_TABLET | Freq: Four times a day (QID) | ORAL | Status: DC | PRN

## 2012-11-09 MED ORDER — ATORVASTATIN CALCIUM 40 MG PO TABS
40.0000 mg | ORAL_TABLET | Freq: Every day | ORAL | Status: DC
  Administered 2012-11-09 – 2012-11-10 (×2): 40 mg via ORAL
  Filled 2012-11-09 (×2): qty 1

## 2012-11-09 MED ORDER — ONDANSETRON HCL 4 MG/2ML IJ SOLN
4.0000 mg | Freq: Once | INTRAMUSCULAR | Status: AC
  Administered 2012-11-09: 4 mg via INTRAVENOUS
  Filled 2012-11-09: qty 2

## 2012-11-09 MED ORDER — ALBUTEROL SULFATE HFA 108 (90 BASE) MCG/ACT IN AERS
2.0000 | INHALATION_SPRAY | Freq: Four times a day (QID) | RESPIRATORY_TRACT | Status: DC | PRN
  Filled 2012-11-09: qty 6.7

## 2012-11-09 MED ORDER — WARFARIN SODIUM 2 MG PO TABS: 2.0000 mg | ORAL_TABLET | Freq: Every day | ORAL | Status: DC

## 2012-11-09 MED ORDER — FUROSEMIDE 40 MG PO TABS
40.0000 mg | ORAL_TABLET | Freq: Every day | ORAL | Status: DC
  Administered 2012-11-09 – 2012-11-10 (×2): 40 mg via ORAL
  Filled 2012-11-09 (×2): qty 1

## 2012-11-09 MED ORDER — ONDANSETRON HCL 4 MG/2ML IJ SOLN: 4.0000 mg | Freq: Four times a day (QID) | INTRAMUSCULAR | Status: DC | PRN

## 2012-11-09 MED ORDER — PANTOPRAZOLE SODIUM 40 MG PO TBEC
40.0000 mg | DELAYED_RELEASE_TABLET | Freq: Every day | ORAL | Status: DC
  Administered 2012-11-09 – 2012-11-10 (×2): 40 mg via ORAL
  Filled 2012-11-09 (×2): qty 1

## 2012-11-09 MED ORDER — ONDANSETRON HCL 4 MG/2ML IJ SOLN: 4.0000 mg | Freq: Three times a day (TID) | INTRAMUSCULAR | Status: DC | PRN

## 2012-11-09 MED ORDER — INSULIN ASPART 100 UNIT/ML ~~LOC~~ SOLN: 0.0000 [IU] | Freq: Three times a day (TID) | SUBCUTANEOUS | Status: DC

## 2012-11-09 MED ORDER — HYDROMORPHONE HCL PF 1 MG/ML IJ SOLN
0.5000 mg | Freq: Once | INTRAMUSCULAR | Status: AC
  Administered 2012-11-09: 0.5 mg via INTRAVENOUS
  Filled 2012-11-09: qty 1

## 2012-11-09 MED ORDER — FERROUS SULFATE 325 (65 FE) MG PO TABS
325.0000 mg | ORAL_TABLET | Freq: Three times a day (TID) | ORAL | Status: DC
  Administered 2012-11-09 – 2012-11-10 (×3): 325 mg via ORAL
  Filled 2012-11-09 (×5): qty 1

## 2012-11-09 MED ORDER — ISOSORBIDE MONONITRATE ER 60 MG PO TB24
60.0000 mg | ORAL_TABLET | Freq: Every day | ORAL | Status: DC
  Administered 2012-11-09 – 2012-11-10 (×2): 60 mg via ORAL
  Filled 2012-11-09 (×2): qty 1

## 2012-11-09 MED ORDER — CARVEDILOL 3.125 MG PO TABS
3.1250 mg | ORAL_TABLET | Freq: Two times a day (BID) | ORAL | Status: DC
  Administered 2012-11-09 – 2012-11-10 (×2): 3.125 mg via ORAL
  Filled 2012-11-09 (×4): qty 1

## 2012-11-09 MED ORDER — WARFARIN SODIUM 4 MG PO TABS
4.0000 mg | ORAL_TABLET | ORAL | Status: DC
  Filled 2012-11-09: qty 1

## 2012-11-09 MED ORDER — WARFARIN SODIUM 2 MG PO TABS
2.0000 mg | ORAL_TABLET | ORAL | Status: DC
  Administered 2012-11-09: 2 mg via ORAL
  Filled 2012-11-09: qty 1

## 2012-11-09 MED ORDER — GLIMEPIRIDE 1 MG PO TABS
1.0000 mg | ORAL_TABLET | Freq: Every day | ORAL | Status: DC
  Administered 2012-11-10: 1 mg via ORAL
  Filled 2012-11-09 (×2): qty 1

## 2012-11-09 MED ORDER — MORPHINE SULFATE 4 MG/ML IJ SOLN: 4.0000 mg | INTRAMUSCULAR | Status: DC | PRN

## 2012-11-09 MED ORDER — ASPIRIN 300 MG RE SUPP
300.0000 mg | RECTAL | Status: AC
  Filled 2012-11-09: qty 1

## 2012-11-09 MED ORDER — ALUM & MAG HYDROXIDE-SIMETH 200-200-20 MG/5ML PO SUSP: 10.0000 mL | Freq: Four times a day (QID) | ORAL | Status: DC | PRN

## 2012-11-09 MED ORDER — MORPHINE SULFATE 4 MG/ML IJ SOLN
6.0000 mg | Freq: Once | INTRAMUSCULAR | Status: AC
  Administered 2012-11-09: 6 mg via INTRAVENOUS
  Filled 2012-11-09: qty 2

## 2012-11-09 MED ORDER — TAMSULOSIN HCL 0.4 MG PO CAPS
0.4000 mg | ORAL_CAPSULE | Freq: Every day | ORAL | Status: DC
  Administered 2012-11-09: 0.4 mg via ORAL
  Filled 2012-11-09 (×2): qty 1

## 2012-11-09 MED ORDER — ASPIRIN EC 81 MG PO TBEC
81.0000 mg | DELAYED_RELEASE_TABLET | Freq: Every day | ORAL | Status: DC
  Administered 2012-11-10: 81 mg via ORAL
  Filled 2012-11-09: qty 1

## 2012-11-09 MED ORDER — LEVOTHYROXINE SODIUM 50 MCG PO TABS
50.0000 ug | ORAL_TABLET | Freq: Every day | ORAL | Status: DC
  Administered 2012-11-10: 50 ug via ORAL
  Filled 2012-11-09 (×2): qty 1

## 2012-11-09 MED ORDER — ASPIRIN 81 MG PO CHEW: 324.0000 mg | CHEWABLE_TABLET | ORAL | Status: AC

## 2012-11-09 MED ORDER — WARFARIN - PHARMACIST DOSING INPATIENT
Freq: Every day | Status: DC
  Administered 2012-11-09: 18:00:00

## 2012-11-09 NOTE — H&P (Signed)
[name] is an 79 y.o. male.   Chief Complaint: Recurrent chest pain/abdominal pain  HPI: Patient is 79 year old male with past medical history significant for multiple medical problems i.e. coronary artery disease history of MI x2 in the past status post PCI to LAD left circumflex and RCA in the past, ischemic cardiomyopathy, history of congestive heart failure secondary to systolic dysfunction, history of bilateral recurrent pulmonary embolism, history of paroxysmal atrial fibrillation, hypercholesteremia, non-insulin-dependent diabetes mellitus, history of DVT in the past, history of remote tobacco abuse, chronic kidney disease stage III, hypothyroidism, history of pancreatitis in the past, GERD, chronic abdominal pain had extensive GI workup here in [place] and [facility] came to the ER complaining of recurrent retrosternal chest pain described as squeezing pressure radiating to both shoulders off and on associated with nausea for last few days as pain got worse today so decided to call EMS. Also complains of vague generalized abdominal pain off and on associated with nausea no diarrhea. She denies any constipation. Denies any fever or chills. Denies any urinary complaints. Denies palpitation lightheadedness or syncope. Denies any weakness in the arms or legs. Denies any slurred speech.  Past Medical History  Diagnosis Date  . GERD (gastroesophageal reflux disease)   . Chronic back pain   . Gout     no problems in 2 years  . Pulmonary embolism, bilateral 2010  . Hyperlipemia   . Myocardial infarction 1987; 1989  . Anginal pain   . DVT, lower extremity 1980's    LLE  . Asthma     "touch"  . Pneumonia 1970's  . Arthritis     "mostly in my knees and shoulders"  . Collapsed lung     "for 14 years" right   . Diabetes mellitus   . Hypertension   . Depression   . Anxiety   . Hearing loss   . Glaucoma   . Complication of anesthesia   . PONV (postoperative nausea and vomiting)   .  Renal disorder ~ 03/2011    "wasn't functioning right"  . Stroke   . Stroke     "just a little one"  . Hypothyroidism     Past Surgical History  Procedure Laterality Date  . Esophagogastroduodenoscopy  12/22/2010    Procedure: ESOPHAGOGASTRODUODENOSCOPY (EGD);  Surgeon: Theda Belfast;  Location: [facility] ENDOSCOPY;  Service: Endoscopy;  Laterality: N/A;  . Appendectomy  1950  . Cataract extraction w/ intraocular lens  implant, bilateral Bilateral 1990's  . Coronary angioplasty with stent placement      "think I have 4 total"  . Inguinal hernia repair Bilateral 03/28/2012    Procedure: LAPAROSCOPIC BILATERAL INGUINAL HERNIA REPAIR;  Surgeon: Ardeth Sportsman, MD;  Location: [facility] ORS;  Service: General;  Laterality: Bilateral;  . Insertion of mesh Bilateral 03/28/2012    Procedure: INSERTION OF MESH;  Surgeon: Ardeth Sportsman, MD;  Location: [facility] ORS;  Service: General;  Laterality: Bilateral;  . Hernia repair    . Esophagogastroduodenoscopy N/A 04/30/2012    Procedure: ESOPHAGOGASTRODUODENOSCOPY (EGD);  Surgeon: Theda Belfast, MD;  Location: [facility] ENDOSCOPY;  Service: Endoscopy;  Laterality: N/A;  . Colonoscopy N/A 05/01/2012    Procedure: COLONOSCOPY;  Surgeon: Theda Belfast, MD;  Location: [facility] ENDOSCOPY;  Service: Endoscopy;  Laterality: N/A;  . Cardiac catheterization      Family History  Problem Relation Age of Onset  . Diabetes Mother   . Heart disease Father   . Lung cancer Brother   . Stroke  Neg Hx   . Hypertension Neg Hx   . Hyperlipidemia Neg Hx   . Alcohol abuse Neg Hx    Social History:  reports that he quit smoking about 31 years ago. His smoking use included Cigarettes. He started smoking about 66 years ago. He has a 6 pack-year smoking history. He has never used smokeless tobacco. He reports that he does not drink alcohol or use illicit drugs.  Allergies: No Known Allergies   (Not in a hospital admission)  Results for orders placed during the hospital encounter of 11/09/12 (from  the past 48 hour(s))  CBC WITH DIFFERENTIAL     Status: Abnormal   Collection Time    11/09/12  9:07 AM      Result Value Range   WBC 6.5  4.0 - 10.5 K/uL   RBC 4.91  4.22 - 5.81 MIL/uL   Hemoglobin 14.4  13.0 - 17.0 g/dL   HCT 40.9  81.1 - 91.4 %   MCV 81.7  78.0 - 100.0 fL   MCH 29.3  26.0 - 34.0 pg   MCHC 35.9  30.0 - 36.0 g/dL   RDW 78.2 (*) 95.6 - 21.3 %   Platelets 149 (*) 150 - 400 K/uL   Neutrophils Relative % 58  43 - 77 %   Neutro Abs 3.8  1.7 - 7.7 K/uL   Lymphocytes Relative 32  12 - 46 %   Lymphs Abs 2.1  0.7 - 4.0 K/uL   Monocytes Relative 10  3 - 12 %   Monocytes Absolute 0.7  0.1 - 1.0 K/uL   Eosinophils Relative 1  0 - 5 %   Eosinophils Absolute 0.1  0.0 - 0.7 K/uL   Basophils Relative 0  0 - 1 %   Basophils Absolute 0.0  0.0 - 0.1 K/uL  COMPREHENSIVE METABOLIC PANEL     Status: Abnormal   Collection Time    11/09/12  9:07 AM      Result Value Range   Sodium 141  135 - 145 mEq/L   Potassium 3.7  3.5 - 5.1 mEq/L   Chloride 103  96 - 112 mEq/L   CO2 28  19 - 32 mEq/L   Glucose, Bld 89  70 - 99 mg/dL   BUN 19  6 - 23 mg/dL   Creatinine, Ser 0.86 (*) 0.50 - 1.35 mg/dL   Calcium 9.3  8.4 - 57.8 mg/dL   Total Protein 7.7  6.0 - 8.3 g/dL   Albumin 3.7  3.5 - 5.2 g/dL   AST 31  0 - 37 U/L   ALT 26  0 - 53 U/L   Alkaline Phosphatase 99  39 - 117 U/L   Total Bilirubin 0.2 (*) 0.3 - 1.2 mg/dL   GFR calc non Af Amer 41 (*) >90 mL/min   GFR calc Af Amer 47 (*) >90 mL/min   Comment: (NOTE)     The eGFR has been calculated using the CKD EPI equation.     This calculation has not been validated in all clinical situations.     eGFR's persistently <90 mL/min signify possible Chronic Kidney     Disease.  LIPASE, BLOOD     Status: None   Collection Time    11/09/12  9:07 AM      Result Value Range   Lipase 49  11 - 59 U/L  TROPONIN I     Status: None   Collection Time    11/09/12  9:07 AM  Result Value Range   Troponin I <0.30  <0.30 ng/mL   Comment:             Due to the release kinetics of cTnI,     a negative result within the first hours     of the onset of symptoms does not rule out     myocardial infarction with certainty.     If myocardial infarction is still suspected,     repeat the test at appropriate intervals.  PROTIME-INR     Status: Abnormal   Collection Time    11/09/12  9:07 AM      Result Value Range   Prothrombin Time 22.6 (*) 11.6 - 15.2 seconds   INR 2.06 (*) 0.00 - 1.49  OCCULT BLOOD, POC DEVICE     Status: None   Collection Time    11/09/12  9:23 AM      Result Value Range   Fecal Occult Bld NEGATIVE  NEGATIVE  CG4 I-STAT (LACTIC ACID)     Status: None   Collection Time    11/09/12  9:27 AM      Result Value Range   Lactic Acid, Venous 0.89  0.5 - 2.2 mmol/L  TROPONIN I     Status: None   Collection Time    11/09/12 11:18 AM      Result Value Range   Troponin I <0.30  <0.30 ng/mL   Comment:            Due to the release kinetics of cTnI,     a negative result within the first hours     of the onset of symptoms does not rule out     myocardial infarction with certainty.     If myocardial infarction is still suspected,     repeat the test at appropriate intervals.   Dg Chest 2 View  11/09/2012   CLINICAL DATA:  Chest pain and cough  EXAM: CHEST  2 VIEW  COMPARISON:  10/18/2012  FINDINGS: Cardiac silhouette is mildly enlarged. The mediastinum is normal in contour. No hilar masses. Minor subsegmental atelectasis and/ or scarring is noted in the left lung base. The lungs are otherwise clear. No pleural effusion or pneumothorax.  The bony thorax is demineralized but intact.  IMPRESSION: No acute cardiopulmonary disease.   Electronically Signed   By: Amie Portland M.D.   On: 11/09/2012 10:56    Review of Systems  Constitutional: Negative for fever and chills.  HENT: Negative for hearing loss and sore throat.   Eyes: Negative for blurred vision and double vision.  Respiratory: Negative for cough, hemoptysis and  sputum production.   Cardiovascular: Positive for chest pain. Negative for palpitations, claudication and leg swelling.  Gastrointestinal: Positive for nausea, vomiting and abdominal pain. Negative for diarrhea, constipation and blood in stool.  Genitourinary: Negative for dysuria.  Neurological: Negative for dizziness, seizures and headaches.    Blood pressure 159/72, pulse 58, temperature 98.5 F (36.9 C), temperature source Oral, resp. rate 9, SpO2 96.00%. Physical Exam  Constitutional: He is oriented to person, place, and time.  HENT:  Head: Normocephalic and atraumatic.  Eyes: Conjunctivae are normal. Left eye exhibits no discharge. No scleral icterus.  Neck: Normal range of motion. Neck supple. No JVD present. No tracheal deviation present. No thyromegaly present.  Cardiovascular: Normal rate and regular rhythm.   Murmur (Soft systolic murmur noted no S3 gallop) heard. Respiratory: Effort normal and breath sounds normal. No respiratory distress. He has  no wheezes. He has no rales.  GI: Soft. Bowel sounds are normal. He exhibits no distension. There is tenderness (mild generalized tenderness no guarding or rebound).  Musculoskeletal: He exhibits no edema and no tenderness.  Neurological: He is alert and oriented to person, place, and time.     Assessment/Plan Recurrent chest pain rule out coronary insufficiency Coronary artery disease history of MI x2 in the past and multiple PCI's in the past History of bilateral pulmonary embolism Ischemic cardiomyopathy Compensated systolic heart failure History of paroxysmal A. fib in the past Hypercholesteremia Non-insulin-dependent diabetes mellitus History of DVT in the past History of remote tobacco abuse Chronic kidney disease stage III GERD Chronic abdominal pain etiology unclear History of pancreatitis in the past Hypothyroidism Hypercholesteremia Plan As per orders Jlee Harkless N 11/09/2012, 1:43 PM

## 2012-11-09 NOTE — ED Notes (Signed)
Pt refused 12 lead immediately stating he had to sit on the toilet.

## 2012-11-09 NOTE — Progress Notes (Signed)
ANTICOAGULATION CONSULT NOTE - Initial Consult  Pharmacy Consult for Warfarin Indication: atrial fibrillation, pulmonary embolus and DVT  No Known Allergies  Patient Measurements: Height: 5' 8.5" (174 cm) Weight: 174 lb 6.1 oz (79.1 kg) IBW/kg (Calculated) : 69.55  Vital Signs: Temp: 98.5 F (36.9 C) (10/19 0912) Temp src: Oral (10/19 0912) BP: 224/85 mmHg (10/19 1418) Pulse Rate: 51 (10/19 1418)  Labs:  Recent Labs  11/09/12 0907 11/09/12 1118  HGB 14.4  --   HCT 40.1  --   PLT 149*  --   LABPROT 22.6*  --   INR 2.06*  --   CREATININE 1.53*  --   TROPONINI <0.30 <0.30    Estimated Creatinine Clearance: 37.3 ml/min (by C-G formula based on Cr of 1.53).   Medical History: Past Medical History  Diagnosis Date  . GERD (gastroesophageal reflux disease)   . Chronic back pain   . Gout     no problems in 2 years  . Pulmonary embolism, bilateral 2010  . Hyperlipemia   . Myocardial infarction 1987; 1989  . Anginal pain   . DVT, lower extremity 1980's    LLE  . Asthma     "touch"  . Pneumonia 1970's  . Arthritis     "mostly in my knees and shoulders"  . Collapsed lung     "for 14 years" right   . Diabetes mellitus   . Hypertension   . Depression   . Anxiety   . Hearing loss   . Glaucoma   . Complication of anesthesia   . PONV (postoperative nausea and vomiting)   . Renal disorder ~ 03/2011    "wasn't functioning right"  . Stroke   . Stroke     "just a little one"  . Hypothyroidism     Medications:  Scheduled:  . amiodarone  200 mg Oral Daily  . aspirin  324 mg Oral NOW   Or  . aspirin  300 mg Rectal NOW  . [START ON 11/10/2012] aspirin EC  81 mg Oral Daily  . atorvastatin  40 mg Oral Daily  . carvedilol  3.125 mg Oral BID WC  . dicyclomine  10 mg Oral TID AC  . ferrous sulfate  325 mg Oral TID WC  . furosemide  40 mg Oral Daily  . [START ON 11/10/2012] glimepiride  1 mg Oral QAC breakfast  . insulin aspart  0-9 Units Subcutaneous TID WC  .  isosorbide mononitrate  60 mg Oral Daily  . [START ON 11/10/2012] levothyroxine  50 mcg Oral QAC breakfast  . losartan  100 mg Oral Daily  . pantoprazole  40 mg Oral Daily  . tamsulosin  0.4 mg Oral QPC supper  . [START ON 11/10/2012] warfarin  4 mg Oral Custom   And  . warfarin  2 mg Oral Q Sun-1800  . Warfarin - Pharmacist Dosing Inpatient   Does not apply q1800    Assessment: 79 yo M presents w/ recurrent chest pain r/o coronary insufficiency. Pt has hx of CAD of MI x2 in the past and multiple PCI's in past. Pt has hx of bilateral PE/Afib- on chronic coumadin. Spoke w/ patient states he is on 4mg  all days except 2mg  on Sunday. Patients INR currently therapeutic 2.06. Continue patients home dose for now  Goal of Therapy:  INR 2-3 Monitor platelets by anticoagulation protocol: Yes   Plan:  -Restart patients home dose -Monitor daily PT/INR, and s/sx of bleed   Forestine Na M 11/09/2012,3:18  PM   

## 2012-11-09 NOTE — ED Notes (Signed)
Pt c/o nausea.  Chest pain improved with 2nd nitro, though pt continues to have RLQ pain.

## 2012-11-09 NOTE — ED Notes (Addendum)
Unable to give pt pain medication d/t scanner not working.  Placed on base for 5 minutes and still does not scan.  Scanner on portable not charged, even after being placed on base.   IT called.

## 2012-11-09 NOTE — ED Notes (Signed)
Pt placed on 2L o2 for decreased O2 sats after dilaudid.

## 2012-11-09 NOTE — ED Notes (Signed)
Pt called EMS b/c he couldn't have a bowel movement and states he needs medicine in his IV. C/o pain to RLQ and dark stools.  Presently on bedside commode with diarrhea.  C/o nausea but the "doctor told him not to vomit".  In the ambulance pt c/o chest pain.  States he has chest pain whenever his stomach hurts.  Tx at [facility] for "stomach problems".

## 2012-11-09 NOTE — ED Notes (Signed)
Dr Zavitz at bedside  

## 2012-11-09 NOTE — ED Provider Notes (Signed)
CSN: [id number]     Arrival date & time 11/09/12  8416 History   First MD Initiated Contact with Patient 11/09/12 0845     Chief Complaint  Patient presents with  . Abdominal Pain  . Chest Pain   (Consider location/radiation/quality/duration/timing/severity/associated sxs/prior Treatment) HPI Comments: 79 yo male with complex medical hx, recent PE, on coumadin, CAD, esophageal stricture, last colonoscopy/ EGD per pt in April, recent admission for PE presents with chest pain, abd pain and diarrhea.  Pt has had CP for 2 months, constant the past two weeks, worse with movement and palpation, similar to previous, non radiating, no diaphoresis or exertional component.  Abd pain lower and epig for the past 8 months, intermittent, similar to previous, appy as a child, no recent surgeries, no sbo hx, no vomiting. Pt has had recent diarrhea for a few days, dark stools, cramping, no abx or travel.  He feels his issues are not new but mild worsening.  Improves with time.  Patient is a 79 y.o. male presenting with abdominal pain and chest pain. The history is provided by the patient.  Abdominal Pain Associated symptoms: chest pain, diarrhea, fatigue and nausea   Associated symptoms: no chills, no dysuria, no fever, no shortness of breath and no vomiting   Chest Pain Associated symptoms: abdominal pain, fatigue and nausea   Associated symptoms: no back pain, no fever, no headache, no shortness of breath and not vomiting     Past Medical History  Diagnosis Date  . GERD (gastroesophageal reflux disease)   . Chronic back pain   . Gout     no problems in 2 years  . Pulmonary embolism, bilateral 2010  . Hyperlipemia   . Myocardial infarction 1987; 1989  . Anginal pain   . DVT, lower extremity 1980's    LLE  . Asthma     "touch"  . Pneumonia 1970's  . Arthritis     "mostly in my knees and shoulders"  . Collapsed lung     "for 14 years" right   . Diabetes mellitus   . Hypertension   .  Depression   . Anxiety   . Hearing loss   . Glaucoma   . Complication of anesthesia   . PONV (postoperative nausea and vomiting)   . Renal disorder ~ 03/2011    "wasn't functioning right"  . Stroke   . Stroke     "just a little one"  . Hypothyroidism    Past Surgical History  Procedure Laterality Date  . Esophagogastroduodenoscopy  12/22/2010    Procedure: ESOPHAGOGASTRODUODENOSCOPY (EGD);  Surgeon: Theda Belfast;  Location: [facility] ENDOSCOPY;  Service: Endoscopy;  Laterality: N/A;  . Appendectomy  1950  . Cataract extraction w/ intraocular lens  implant, bilateral Bilateral 1990's  . Coronary angioplasty with stent placement      "think I have 4 total"  . Inguinal hernia repair Bilateral 03/28/2012    Procedure: LAPAROSCOPIC BILATERAL INGUINAL HERNIA REPAIR;  Surgeon: Ardeth Sportsman, MD;  Location: [facility] ORS;  Service: General;  Laterality: Bilateral;  . Insertion of mesh Bilateral 03/28/2012    Procedure: INSERTION OF MESH;  Surgeon: Ardeth Sportsman, MD;  Location: [facility] ORS;  Service: General;  Laterality: Bilateral;  . Hernia repair    . Esophagogastroduodenoscopy N/A 04/30/2012    Procedure: ESOPHAGOGASTRODUODENOSCOPY (EGD);  Surgeon: Theda Belfast, MD;  Location: [facility] ENDOSCOPY;  Service: Endoscopy;  Laterality: N/A;  . Colonoscopy N/A 05/01/2012    Procedure: COLONOSCOPY;  Surgeon: Luisa Hart  Jamelle Haring, MD;  Location: [facility] ENDOSCOPY;  Service: Endoscopy;  Laterality: N/A;  . Cardiac catheterization     Family History  Problem Relation Age of Onset  . Diabetes Mother   . Heart disease Father   . Lung cancer Brother   . Stroke Neg Hx   . Hypertension Neg Hx   . Hyperlipidemia Neg Hx   . Alcohol abuse Neg Hx    History  Substance Use Topics  . Smoking status: Former Smoker -- 2.00 packs/day for 3 years    Types: Cigarettes    Start date: 01/27/1946    Quit date: 01/22/1981  . Smokeless tobacco: Never Used  . Alcohol Use: No     Comment: 07/12/2011; "last drink of alcohol was ~ 1983"     Review of Systems  Constitutional: Positive for appetite change and fatigue. Negative for fever and chills.  HENT: Negative for congestion.   Eyes: Negative for visual disturbance.  Respiratory: Negative for shortness of breath.   Cardiovascular: Positive for chest pain.  Gastrointestinal: Positive for nausea, abdominal pain, diarrhea and blood in stool (dark). Negative for vomiting.  Genitourinary: Negative for dysuria and flank pain.  Musculoskeletal: Positive for arthralgias. Negative for back pain, neck pain and neck stiffness.  Skin: Negative for rash.  Neurological: Negative for light-headedness and headaches.    Allergies  Review of patient's allergies indicates no known allergies.  Home Medications   Current Outpatient Rx  Name  Route  Sig  Dispense  Refill  . acetaminophen (TYLENOL) 500 MG tablet   Oral   Take 1,000 mg by mouth every 6 (six) hours as needed for pain.         Marland Kitchen albuterol (PROVENTIL HFA;VENTOLIN HFA) 108 (90 BASE) MCG/ACT inhaler   Inhalation   Inhale 2 puffs into the lungs every 6 (six) hours as needed for shortness of breath.          Marland Kitchen alum & mag hydroxide-simeth (MAALOX/MYLANTA) 200-200-20 MG/5ML suspension   Oral   Take 10 mLs by mouth every 6 (six) hours as needed for indigestion.         Marland Kitchen amiodarone (PACERONE) 200 MG tablet   Oral   Take 200 mg by mouth daily.         Marland Kitchen atorvastatin (LIPITOR) 40 MG tablet   Oral   Take 40 mg by mouth daily.         . beclomethasone (BECONASE-AQ) 42 MCG/SPRAY nasal spray   Nasal   Place 1 spray into the nose 2 (two) times daily. Dose is for each nostril.         . carvedilol (COREG) 3.125 MG tablet   Oral   Take 3.125 mg by mouth 2 (two) times daily with a meal.         . dicyclomine (BENTYL) 10 MG capsule   Oral   Take 10 mg by mouth 3 (three) times daily before meals.         . ferrous sulfate 325 (65 FE) MG tablet   Oral   Take 325 mg by mouth 3 (three) times daily with  meals.         . furosemide (LASIX) 40 MG tablet   Oral   Take 40 mg by mouth daily.          Marland Kitchen glimepiride (AMARYL) 1 MG tablet   Oral   Take 1 mg by mouth daily before breakfast.         . levothyroxine (  SYNTHROID, LEVOTHROID) 50 MCG tablet   Oral   Take 50 mcg by mouth daily before breakfast.          . losartan (COZAAR) 100 MG tablet   Oral   Take 100 mg by mouth daily.         . nitroGLYCERIN (NITROSTAT) 0.4 MG SL tablet   Sublingual   Place 0.4 mg under the tongue every 5 (five) minutes as needed for chest pain.         Marland Kitchen omeprazole (PRILOSEC) 20 MG capsule   Oral   Take 20 mg by mouth daily.         . ranitidine (ZANTAC) 300 MG capsule   Oral   Take 300 mg by mouth every evening.         . warfarin (COUMADIN) 4 MG tablet   Oral   Take 2-4 mg by mouth daily. Takes 1 tablet (4mg ) every day except on Sunday takes 1/2 tablet (2mg )          BP 190/75  Pulse 63  Temp(Src) 98.5 F (36.9 C) (Oral)  Resp 16  SpO2 96% Physical Exam  Nursing note and vitals reviewed. Constitutional: He is oriented to person, place, and time. He appears well-developed and well-nourished.  HENT:  Head: Normocephalic and atraumatic.  Eyes: Right eye exhibits no discharge. Left eye exhibits no discharge.  Chronic appearing sclera  Neck: Normal range of motion. Neck supple. No tracheal deviation present.  Cardiovascular: Normal rate, regular rhythm and intact distal pulses.   Pulmonary/Chest: Effort normal and breath sounds normal.  Abdominal: Soft. He exhibits no distension. There is tenderness (mild lower bilateral). There is no guarding.  Musculoskeletal: He exhibits edema (mild LE bilateral).  Neurological: He is alert and oriented to person, place, and time.  Skin: Skin is warm. No rash noted.  Psychiatric: He has a normal mood and affect.    ED Course  Procedures (including critical care time) Labs Review Labs Reviewed  CBC WITH DIFFERENTIAL  COMPREHENSIVE  METABOLIC PANEL  LIPASE, BLOOD  TROPONIN I  TROPONIN I  PROTIME-INR   Imaging Review No results found.  EKG Interpretation   None       MDM  No diagnosis found. Acute on chronic sxs.  With risk factors and known hx will cardiac screen - ekg abnormal LBBB however similar to previous, atypical cp, constant for weeks, no concern for acute ischemia and pt has outpt fup.  Abd pain chronic - concern for ischemic bowel with vasc hx, recent CT abd pelvis showed vasc dz but no acute blockage, reviewed results. Will check lactate, pain meds.  Dark stools - will check Hb and hemocult. On coumadin, INR BP elevated, pt always has high bp, will give pain meds and monitor, stressed importance of close fup for bp.    Date: 11/09/2012  Rate: 57  Rhythm: normal sinus rhythm  QRS Axis: left  Intervals: QT prolonged  ST/T Wave abnormalities: ST elevations anteriorly  Conduction Disutrbances:left bundle branch block  Narrative Interpretation:   Old EKG Reviewed: unchanged  Pt improved on initial recheck no abd pain then chest pain returned.  Spoke with pcp/ cardiologist, recommended observation for serial troponins.   The patients results and plan were reviewed and discussed.   Any x-rays performed were personally reviewed by myself.   Differential diagnosis were considered with the presenting HPI.  Diagnosis: Chest pain, chronic abd pain  Admission/ observation were discussed with the admitting physician, patient and/or family and  they are comfortable with the plan.       Enid Skeens, MD 11/09/12 2140

## 2012-11-10 LAB — BASIC METABOLIC PANEL
BUN: 18 mg/dL (ref 6–23)
Creatinine, Ser: 1.6 mg/dL — ABNORMAL HIGH (ref 0.50–1.35)
GFR calc Af Amer: 45 mL/min — ABNORMAL LOW (ref >90)
GFR calc non Af Amer: 39 mL/min — ABNORMAL LOW (ref >90)
Potassium: 3.3 mEq/L — ABNORMAL LOW (ref 3.5–5.1)

## 2012-11-10 LAB — PROTIME-INR: Prothrombin Time: 23.4 seconds — ABNORMAL HIGH (ref 11.6–15.2)

## 2012-11-10 LAB — CBC
HCT: 36 % — ABNORMAL LOW (ref 39.0–52.0)
MCH: 28.2 pg (ref 26.0–34.0)
MCHC: 34.2 g/dL (ref 30.0–36.0)
MCV: 82.6 fL (ref 78.0–100.0)
Platelets: 130 10*3/uL — ABNORMAL LOW (ref 150–400)
RDW: 16.6 % — ABNORMAL HIGH (ref 11.5–15.5)
WBC: 5.1 10*3/uL (ref 4.0–10.5)

## 2012-11-10 LAB — LIPID PANEL
Cholesterol: 140 mg/dL (ref 0–200)
HDL: 71 mg/dL (ref >39)
LDL Cholesterol: 62 mg/dL (ref 0–99)
Total CHOL/HDL Ratio: 2 RATIO
Triglycerides: 37 mg/dL (ref <150)

## 2012-11-10 LAB — TROPONIN I: Troponin I: <0.3 ng/mL (ref <0.30)

## 2012-11-10 LAB — GLUCOSE, CAPILLARY
Glucose-Capillary: 103 mg/dL — ABNORMAL HIGH (ref 70–99)
Glucose-Capillary: 101 mg/dL — ABNORMAL HIGH (ref 70–99)

## 2012-11-10 MED ORDER — WARFARIN SODIUM 4 MG PO TABS
4.0000 mg | ORAL_TABLET | ORAL | Status: DC
  Filled 2012-11-10: qty 1

## 2012-11-10 MED ORDER — WARFARIN SODIUM 2 MG PO TABS: 2.0000 mg | ORAL_TABLET | ORAL | Status: DC

## 2012-11-10 NOTE — Discharge Summary (Signed)
  Is dictated on 11/10/2012 dictation number is [phone]

## 2012-11-10 NOTE — Progress Notes (Addendum)
ANTICOAGULATION CONSULT NOTE - Follow Up Consult  Pharmacy Consult for Warfarin Indication: Recurrent atrial fibrillation, pulmonary embolus and DVT  No Known Allergies  Patient Measurements: Height: 5' 8.5" (174 cm) Weight: 174 lb 6.1 oz (79.1 kg) IBW/kg (Calculated) : 69.55  Vital Signs: Temp: 98.1 F (36.7 C) (10/20 0540) BP: 156/75 mmHg (10/20 0831) Pulse Rate: 60 (10/20 0831)  Labs:  Recent Labs  11/09/12 0907  11/09/12 1418 11/09/12 1500 11/09/12 1958 11/10/12 0240  HGB 14.4  --   --  13.9  --  12.3*  HCT 40.1  --   --  39.1  --  36.0*  PLT 149*  --   --  139*  --  130*  APTT  --   --   --  27  --   --   LABPROT 22.6*  --   --   --   --  23.4*  INR 2.06*  --   --   --   --  2.16*  CREATININE 1.53*  --   --   --   --  1.60*  TROPONINI <0.30  < > <0.30  --  <0.30 <0.30  < > = values in this interval not displayed.  Estimated Creatinine Clearance: 35.6 ml/min (by C-G formula based on Cr of 1.6).  Medical History: Past Medical History  Diagnosis Date  . GERD (gastroesophageal reflux disease)   . Chronic back pain   . Gout     no problems in 2 years  . Pulmonary embolism, bilateral 2010  . Hyperlipemia   . Myocardial infarction 1987; 1989  . Anginal pain   . DVT, lower extremity 1980's    LLE  . Asthma     "touch"  . Pneumonia 1970's  . Arthritis     "mostly in my knees and shoulders"  . Collapsed lung     "for 14 years" right   . Diabetes mellitus   . Hypertension   . Depression   . Anxiety   . Hearing loss   . Glaucoma   . Complication of anesthesia   . PONV (postoperative nausea and vomiting)   . Renal disorder ~ 03/2011    "wasn't functioning right"  . Stroke   . Stroke     "just a little one"  . Hypothyroidism    Medications:   . warfarin  4 mg Oral Custom   Assessment: 79 yo M presents w/ recurrent chest pain r/o coronary insufficiency. Pt has hx of CAD of MI x2 in the past and multiple PCI's.  Pt has hx of bilateral PE/Afib- on  chronic coumadin with a therapeutic level this morning at 2.16.   HOME Dose:  Warfarin 4mg  all days except 2mg  on Sunday.   Drug/Drug interactions: Amiodarone may increase the hypoprothrombinemic effect of warfarin.  Levothyroxine can also increase the hypoprothrombinemic effects of warfarin.  Goal of Therapy:  INR 2-3 Monitor platelets by anticoagulation protocol: Yes   Plan:  - Continue his home regimen - Monitor PT/INR every MWF - Follow for s/sx of bleed  Nadara Mustard, PharmD., MS Clinical Pharmacist Pager:  [phone] Thank you for allowing pharmacy to be part of this patients care team. 11/10/2012,9:30 AM

## 2012-11-10 NOTE — Progress Notes (Signed)
UR Completed Ahmon Tosi Graves-Bigelow, RN,BSN [phone]  

## 2012-11-10 NOTE — Progress Notes (Signed)
CSW Proofreader) spoke with pt. He is from an independent living facility. Pt has no social work needs at this time. CSW signing off.  Ellery Meroney, LCSWA [phone]

## 2012-11-11 NOTE — Discharge Summary (Signed)
NAMEMALACHY, COLEMAN NO.:  [id number]  MEDICAL RECORD NO.:  [id number]  LOCATION:  3W29C                        FACILITY:  [facility]  PHYSICIAN:  Eduardo Osier. Sharyn Lull, M.D. DATE OF BIRTH:  January 18, 1930  DATE OF ADMISSION:  11/09/2012 DATE OF DISCHARGE:  11/10/2012                              DISCHARGE SUMMARY   ADMITTING DIAGNOSES: 1. Recurrent chest pain, rule out coronary insufficiency. 2. Coronary artery disease, history of myocardial infarction x2 in the     past, status post multiple percutaneous coronary interventions in     the past. 3. History of bilateral pulmonary embolism. 4. Ischemic cardiomyopathy. 5. Compensated systolic heart failure. 6. History of paroxysmal atrial fibrillation in the past. 7. Hypercholesteremia. 8. Non-insulin-dependent diabetes mellitus. 9. History of deep venous thrombosis in the past. 10.History of remote tobacco abuse. 11.Chronic kidney disease stage III. 12.Gastroesophageal reflux disease. 13.Chronic abdominal pain, etiology unclear.  Workup negative in the     past. 14.History of pancreatitis in the past. 15.Hypothyroidism. 16.Hypercholesterolemia.  FINAL DIAGNOSES: 1. Status post chest pain, myocardial infarction ruled out.  The rest     of the diagnosis are same, i.e., coronary artery disease, history     of myocardial infarction x2 in the past, multiple percutaneous     coronary interventions in the past, history of bilateral pulmonary     embolism. 2. Ischemic cardiomyopathy. 3. Compensated systolic heart failure. 4. History of paroxysmal atrial fibrillation in the past. 5. Hypercholesteremia. 6. Non-insulin-dependent diabetes mellitus. 7. History of deep venous thrombosis in the past. 8. History of remote tobacco abuse. 9. Chronic kidney disease stage III. 10.Gastroesophageal reflux disease. 11.Chronic abdominal pain.  Workup negative. 12.History of pancreatitis in the  past. 13.Hypothyroidism. 14.Hypercholesteremia.  DISCHARGE HOME MEDICATIONS: 1. Tylenol 500 mg every 6 hours as needed for pain. 2. Albuterol 2 puffs every 6 hours as needed. 3. Maalox 10 mL every 6 hours as needed. 4. Amiodarone 200 mg daily. 5. Atorvastatin 40 mg daily. 6. Beconase 1 spray twice daily as before. 7. QVAR 2 puffs twice daily as before. 8. Carvedilol 3.125 mg twice daily. 9. Bentyl 10 mg 1 capsule 3 times daily as before. 10.Ferrous sulfate 325 mg 3 times daily as before. 11.Lasix 40 mg 1 tablet daily. 12.Amaryl 1 mg daily. 13.Isopto Tears drops as before. 14.Imdur 60 mg daily. 15.Levothyroxine 50 mcg daily. 16.Losartan 100 mg daily. 17.Nitrostat sublingual use as directed. 18.Omeprazole 20 mg daily. 19.Ranitidine 300 mg by mouth every night. 20.Flomax 0.4 mg daily. 21.Warfarin 4 mg every day except on Sundays, take 2 mg.  DIET:  Low salt, low cholesterol.  ACTIVITY:  Increase activity as tolerated.  Check PT/INR in 1 week. Follow up with me in 1 week.  CONDITION AT DISCHARGE:  Stable.  BRIEF HISTORY AND HOSPITAL COURSE:  Mr. Whiters is 79 year old male with past medical history significant for multiple medical problems, i.e., coronary artery disease, history of MI x2 in the past, status post PCI to LAD, left circumflex and RCA in the past, ischemic cardiomyopathy, history of congestive heart failure secondary to systolic dysfunction, history of bilateral recurrent pulmonary embolism, history of paroxysmal AFib, hypercholesteremia, non-insulin-dependent diabetes mellitus, history of DVT in the  past, history of remote tobacco abuse, chronic kidney disease stage III, hypothyroidism, history of pancreatitis in the past, GERD, chronic abdominal pain, had extensive GI workup here in [place] and at [facility], came to ER complaining of recurrent retrosternal chest pain described as squeezing pressure radiating to both shoulder off and on associated with  nausea for last few days.  As the pain got worse, so decided to call EMS.  The patient also complains of vague generalized abdominal pain off and on associated with nausea, no diarrhea, no constipation.  Denies any fever or chills.  Denies any urinary complaints.  Denies palpitation, lightheadedness, or syncope. Denies any weakness in the arms or legs.  Denies any slurred speech.  PAST MEDICAL HISTORY:  As above.  PHYSICAL EXAMINATION:  GENERAL:  He was alert, awake, oriented x3. VITAL SIGNS:  Blood pressure was 159/72, pulse was 58 and regular.  He was afebrile. EYES:  Conjunctivae were pink. NECK:  Supple.  No JVD.  No bruit. LUNGS:  Clear to auscultation without rhonchi or rales. CARDIOVASCULAR:  S1, S2 was normal.  There was soft systolic murmur. There was no S3 gallop. ABDOMEN:  Soft.  Bowel sounds were present.  There was mild generalized tenderness.  No guarding, no rebound. EXTREMITIES:  There is no clubbing, cyanosis, or edema.  LABORATORY DATA:  Sodium was 141, potassium 3.7, BUN was 19, creatinine 1.53, glucose was 89.  Repeat blood sugar was 101.  Four sets of troponin-I were normal.  Total cholesterol was 140, triglycerides 37, HDL 71, LDL 62, hemoglobin was 14.4, hematocrit 40.1, white count of 6.5.  His PT was 22.6, INR 2.06.  Hemoglobin A1c was 6.1.  TSH was 2.90. Fecal occult blood was negative.  Chest x-ray showed no acute cardiopulmonary disease.  EKG showed normal sinus rhythm, sinus bradycardia with left bundle-branch block which was old.  BRIEF HOSPITAL COURSE:  The patient was admitted to telemetry unit.  MI was ruled out by serial enzymes and EKG.  The patient did not had any further anginal chest pain during the hospital stay.  Continued to have vague abdominal discomfort.  The patient had extensive GI workup which has been negative including for ischemic bowel.  The patient had also cardiac cath, in recent past was noted to have patent stents and  had apical LAD stenosis which was felt not suitable for PCI.  The patient will be treated medically.  The patient will be discharged home on above medications and will be followed up in my office in 1 week.     Eduardo Osier. Sharyn Lull, M.D.     MNH/MEDQ  D:  11/10/2012  T:  11/11/2012  Job:  [id number]

## 2012-12-27 ENCOUNTER — Emergency Department (HOSPITAL_COMMUNITY)
Admission: EM | Admit: 2012-12-27 | Discharge: 2012-12-27 | Disposition: A | Discharge status: Home / Self Care | Payer: Medicare Other | Attending: Emergency Medicine | Admitting: Emergency Medicine

## 2012-12-27 ENCOUNTER — Emergency Department (HOSPITAL_COMMUNITY): Payer: Medicare Other

## 2012-12-27 ENCOUNTER — Encounter (HOSPITAL_COMMUNITY): Payer: Self-pay | Admitting: Emergency Medicine

## 2012-12-27 DIAGNOSIS — E785 Hyperlipidemia, unspecified: Secondary | ICD-10-CM | POA: Insufficient documentation

## 2012-12-27 DIAGNOSIS — G8929 Other chronic pain: Secondary | ICD-10-CM | POA: Insufficient documentation

## 2012-12-27 DIAGNOSIS — J45909 Unspecified asthma, uncomplicated: Secondary | ICD-10-CM | POA: Insufficient documentation

## 2012-12-27 DIAGNOSIS — Z86718 Personal history of other venous thrombosis and embolism: Secondary | ICD-10-CM | POA: Insufficient documentation

## 2012-12-27 DIAGNOSIS — E119 Type 2 diabetes mellitus without complications: Secondary | ICD-10-CM | POA: Insufficient documentation

## 2012-12-27 DIAGNOSIS — Z79899 Other long term (current) drug therapy: Secondary | ICD-10-CM | POA: Insufficient documentation

## 2012-12-27 DIAGNOSIS — Z8701 Personal history of pneumonia (recurrent): Secondary | ICD-10-CM | POA: Insufficient documentation

## 2012-12-27 DIAGNOSIS — R110 Nausea: Secondary | ICD-10-CM | POA: Insufficient documentation

## 2012-12-27 DIAGNOSIS — Z8673 Personal history of transient ischemic attack (TIA), and cerebral infarction without residual deficits: Secondary | ICD-10-CM | POA: Insufficient documentation

## 2012-12-27 DIAGNOSIS — N323 Diverticulum of bladder: Secondary | ICD-10-CM

## 2012-12-27 DIAGNOSIS — M549 Dorsalgia, unspecified: Secondary | ICD-10-CM | POA: Insufficient documentation

## 2012-12-27 DIAGNOSIS — Z87448 Personal history of other diseases of urinary system: Secondary | ICD-10-CM | POA: Insufficient documentation

## 2012-12-27 DIAGNOSIS — I10 Essential (primary) hypertension: Secondary | ICD-10-CM | POA: Insufficient documentation

## 2012-12-27 DIAGNOSIS — Z7901 Long term (current) use of anticoagulants: Secondary | ICD-10-CM | POA: Insufficient documentation

## 2012-12-27 DIAGNOSIS — E039 Hypothyroidism, unspecified: Secondary | ICD-10-CM | POA: Insufficient documentation

## 2012-12-27 DIAGNOSIS — I252 Old myocardial infarction: Secondary | ICD-10-CM | POA: Insufficient documentation

## 2012-12-27 DIAGNOSIS — H409 Unspecified glaucoma: Secondary | ICD-10-CM | POA: Insufficient documentation

## 2012-12-27 DIAGNOSIS — M129 Arthropathy, unspecified: Secondary | ICD-10-CM | POA: Insufficient documentation

## 2012-12-27 DIAGNOSIS — R1031 Right lower quadrant pain: Secondary | ICD-10-CM | POA: Insufficient documentation

## 2012-12-27 DIAGNOSIS — K219 Gastro-esophageal reflux disease without esophagitis: Secondary | ICD-10-CM | POA: Insufficient documentation

## 2012-12-27 DIAGNOSIS — IMO0002 Reserved for concepts with insufficient information to code with codable children: Secondary | ICD-10-CM | POA: Insufficient documentation

## 2012-12-27 DIAGNOSIS — Z87891 Personal history of nicotine dependence: Secondary | ICD-10-CM | POA: Insufficient documentation

## 2012-12-27 LAB — COMPREHENSIVE METABOLIC PANEL
ALT: 24 U/L (ref 0–53)
AST: 26 U/L (ref 0–37)
Albumin: 3.1 g/dL — ABNORMAL LOW (ref 3.5–5.2)
Alkaline Phosphatase: 74 U/L (ref 39–117)
BUN: 17 mg/dL (ref 6–23)
CO2: 29 mEq/L (ref 19–32)
Chloride: 109 mEq/L (ref 96–112)
Glucose, Bld: 72 mg/dL (ref 70–99)
Potassium: 3.8 mEq/L (ref 3.5–5.1)
Total Bilirubin: 0.3 mg/dL (ref 0.3–1.2)

## 2012-12-27 LAB — CBC WITH DIFFERENTIAL/PLATELET
Basophils Absolute: 0 10*3/uL (ref 0.0–0.1)
Basophils Relative: 0 % (ref 0–1)
Eosinophils Absolute: 0.1 10*3/uL (ref 0.0–0.7)
Eosinophils Relative: 1 % (ref 0–5)
HCT: 35 % — ABNORMAL LOW (ref 39.0–52.0)
Lymphocytes Relative: 43 % (ref 12–46)
MCH: 29.2 pg (ref 26.0–34.0)
MCHC: 35.1 g/dL (ref 30.0–36.0)
MCV: 83.1 fL (ref 78.0–100.0)
Monocytes Absolute: 0.6 10*3/uL (ref 0.1–1.0)
Neutro Abs: 2.3 10*3/uL (ref 1.7–7.7)
RDW: 15.6 % — ABNORMAL HIGH (ref 11.5–15.5)
WBC: 5.1 10*3/uL (ref 4.0–10.5)

## 2012-12-27 LAB — URINALYSIS, ROUTINE W REFLEX MICROSCOPIC
Bilirubin Urine: NEGATIVE
Ketones, ur: NEGATIVE mg/dL
Nitrite: NEGATIVE
Protein, ur: NEGATIVE mg/dL
Urobilinogen, UA: 0.2 mg/dL (ref 0.0–1.0)
pH: 7 (ref 5.0–8.0)

## 2012-12-27 MED ORDER — HYDROMORPHONE HCL PF 1 MG/ML IJ SOLN
1.0000 mg | Freq: Once | INTRAMUSCULAR | Status: AC
  Administered 2012-12-27: 1 mg via INTRAVENOUS
  Filled 2012-12-27: qty 1

## 2012-12-27 MED ORDER — ONDANSETRON HCL 4 MG/2ML IJ SOLN
4.0000 mg | Freq: Once | INTRAMUSCULAR | Status: AC
  Administered 2012-12-27: 4 mg via INTRAVENOUS
  Filled 2012-12-27: qty 2

## 2012-12-27 MED ORDER — TRAMADOL HCL 50 MG PO TABS: 50.0000 mg | ORAL_TABLET | Freq: Four times a day (QID) | ORAL | Status: DC | PRN

## 2012-12-27 MED ORDER — SODIUM CHLORIDE 0.9 % IV SOLN
INTRAVENOUS | Status: DC
  Administered 2012-12-27: 16:00:00 via INTRAVENOUS

## 2012-12-27 MED ORDER — IOHEXOL 300 MG/ML  SOLN
25.0000 mL | INTRAMUSCULAR | Status: AC
  Administered 2012-12-27: 25 mL via ORAL

## 2012-12-27 MED ORDER — IOHEXOL 300 MG/ML  SOLN
100.0000 mL | Freq: Once | INTRAMUSCULAR | Status: AC | PRN
  Administered 2012-12-27: 100 mL via INTRAVENOUS

## 2012-12-27 NOTE — ED Notes (Signed)
Provided patient with sandwich bag; he is still trying to find a ride home.

## 2012-12-27 NOTE — ED Provider Notes (Signed)
CSN: [id number]     Arrival date & time 12/27/12  1433 History   First MD Initiated Contact with Patient 12/27/12 1448     Chief Complaint  Patient presents with  . Abdominal Pain   (Consider location/radiation/quality/duration/timing/severity/associated sxs/prior Treatment) Patient is a 79 y.o. male presenting with abdominal pain. The history is provided by the patient.  Abdominal Pain Associated symptoms: nausea   Associated symptoms: no chest pain, no dysuria, no fever, no hematuria, no shortness of breath and no vomiting    the patient with persistent right groin pain following hernia repair by [facility] Dr. Michaell Cowing. Patient states that he gets occasional nausea but no vomiting or diarrhea has been having discomfort for about 7 months occurs in the right lower corner. He thinks it is some kind of nerve damage there. Has not noted a recurrent mass. Pain is worse is 8/10 currently is about a 1/10. Not made worse or better by anything.  Past Medical History  Diagnosis Date  . GERD (gastroesophageal reflux disease)   . Chronic back pain   . Gout     no problems in 2 years  . Pulmonary embolism, bilateral 2010  . Hyperlipemia   . Myocardial infarction 1987; 1989  . Anginal pain   . DVT, lower extremity 1980's    LLE  . Asthma     "touch"  . Pneumonia 1970's  . Arthritis     "mostly in my knees and shoulders"  . Collapsed lung     "for 14 years" right   . Diabetes mellitus   . Hypertension   . Depression   . Anxiety   . Hearing loss   . Glaucoma   . Complication of anesthesia   . PONV (postoperative nausea and vomiting)   . Renal disorder ~ 03/2011    "wasn't functioning right"  . Stroke   . Stroke     "just a little one"  . Hypothyroidism    Past Surgical History  Procedure Laterality Date  . Esophagogastroduodenoscopy  12/22/2010    Procedure: ESOPHAGOGASTRODUODENOSCOPY (EGD);  Surgeon: Theda Belfast;  Location: [facility] ENDOSCOPY;  Service: Endoscopy;   Laterality: N/A;  . Appendectomy  1950  . Cataract extraction w/ intraocular lens  implant, bilateral Bilateral 1990's  . Coronary angioplasty with stent placement      "think I have 4 total"  . Inguinal hernia repair Bilateral 03/28/2012    Procedure: LAPAROSCOPIC BILATERAL INGUINAL HERNIA REPAIR;  Surgeon: Ardeth Sportsman, MD;  Location: [facility] ORS;  Service: General;  Laterality: Bilateral;  . Insertion of mesh Bilateral 03/28/2012    Procedure: INSERTION OF MESH;  Surgeon: Ardeth Sportsman, MD;  Location: [facility] ORS;  Service: General;  Laterality: Bilateral;  . Hernia repair    . Esophagogastroduodenoscopy N/A 04/30/2012    Procedure: ESOPHAGOGASTRODUODENOSCOPY (EGD);  Surgeon: Theda Belfast, MD;  Location: [facility] ENDOSCOPY;  Service: Endoscopy;  Laterality: N/A;  . Colonoscopy N/A 05/01/2012    Procedure: COLONOSCOPY;  Surgeon: Theda Belfast, MD;  Location: [facility] ENDOSCOPY;  Service: Endoscopy;  Laterality: N/A;  . Cardiac catheterization     Family History  Problem Relation Age of Onset  . Diabetes Mother   . Heart disease Father   . Lung cancer Brother   . Stroke Neg Hx   . Hypertension Neg Hx   . Hyperlipidemia Neg Hx   . Alcohol abuse Neg Hx    History  Substance Use Topics  . Smoking status: Former Smoker --  2.00 packs/day for 3 years    Types: Cigarettes    Start date: 01/27/1946    Quit date: 01/22/1981  . Smokeless tobacco: Never Used  . Alcohol Use: No     Comment: 07/12/2011; "last drink of alcohol was ~ 1983"    Review of Systems  Constitutional: Negative for fever.  HENT: Negative for congestion.   Eyes: Negative for redness.  Respiratory: Negative for shortness of breath.   Cardiovascular: Negative for chest pain.  Gastrointestinal: Positive for nausea and abdominal pain. Negative for vomiting.  Genitourinary: Negative for dysuria, hematuria, flank pain, scrotal swelling, difficulty urinating and testicular pain.  Musculoskeletal: Negative for back pain.  Skin: Negative for  rash.  Neurological: Negative for headaches.  Hematological: Bruises/bleeds easily.  Psychiatric/Behavioral: Negative for confusion.    Allergies  Review of patient's allergies indicates no known allergies.  Home Medications   Current Outpatient Rx  Name  Route  Sig  Dispense  Refill  . acetaminophen (TYLENOL) 500 MG tablet   Oral   Take 1,000 mg by mouth every 6 (six) hours as needed for pain.         Marland Kitchen albuterol (PROVENTIL HFA;VENTOLIN HFA) 108 (90 BASE) MCG/ACT inhaler   Inhalation   Inhale 2 puffs into the lungs every 6 (six) hours as needed for shortness of breath.          Marland Kitchen alum & mag hydroxide-simeth (MAALOX/MYLANTA) 200-200-20 MG/5ML suspension   Oral   Take 15 mLs by mouth every 6 (six) hours as needed for indigestion or heartburn.         Marland Kitchen amiodarone (PACERONE) 200 MG tablet   Oral   Take 200 mg by mouth daily.         Marland Kitchen atorvastatin (LIPITOR) 40 MG tablet   Oral   Take 40 mg by mouth daily.         . beclomethasone (QVAR) 80 MCG/ACT inhaler   Inhalation   Inhale 2 puffs into the lungs 2 (two) times daily.         . carvedilol (COREG) 3.125 MG tablet   Oral   Take 3.125 mg by mouth 2 (two) times daily with a meal.         . dicyclomine (BENTYL) 10 MG capsule   Oral   Take 10 mg by mouth 3 (three) times daily before meals.         . ferrous sulfate 325 (65 FE) MG tablet   Oral   Take 325 mg by mouth 3 (three) times daily with meals.         . furosemide (LASIX) 40 MG tablet   Oral   Take 40 mg by mouth daily.          Marland Kitchen glimepiride (AMARYL) 1 MG tablet   Oral   Take 1 mg by mouth daily before breakfast.         . hydroxypropyl methylcellulose (ISOPTO TEARS) 2.5 % ophthalmic solution   Both Eyes   Place 1 drop into both eyes 2 (two) times daily as needed (dry eyes).         . isosorbide mononitrate (IMDUR) 60 MG 24 hr tablet   Oral   Take 60 mg by mouth daily.         Marland Kitchen levothyroxine (SYNTHROID, LEVOTHROID) 50 MCG  tablet   Oral   Take 50 mcg by mouth daily before breakfast.          . losartan (COZAAR) 100 MG  tablet   Oral   Take 100 mg by mouth daily.         . nitroGLYCERIN (NITROSTAT) 0.4 MG SL tablet   Sublingual   Place 0.4 mg under the tongue every 5 (five) minutes as needed for chest pain.         Marland Kitchen omeprazole (PRILOSEC) 20 MG capsule   Oral   Take 20 mg by mouth daily.         . ranitidine (ZANTAC) 300 MG capsule   Oral   Take 300 mg by mouth every evening.         . tamsulosin (FLOMAX) 0.4 MG CAPS capsule   Oral   Take 0.4 mg by mouth daily after supper.         . warfarin (COUMADIN) 4 MG tablet   Oral   Take 4 mg by mouth daily.          . traMADol (ULTRAM) 50 MG tablet   Oral   Take 1 tablet (50 mg total) by mouth every 6 (six) hours as needed.   20 tablet   0    BP 177/79  Pulse 57  Temp(Src) 98.6 F (37 C) (Oral)  Resp 16  Ht 5\' 8"  (1.727 m)  Wt 175 lb (79.379 kg)  BMI 26.61 kg/m2  SpO2 98% Physical Exam  Nursing note and vitals reviewed. Constitutional: He is oriented to person, place, and time. He appears well-developed and well-nourished. No distress.  HENT:  Head: Normocephalic and atraumatic.  Mouth/Throat: Oropharynx is clear and moist.  Eyes: Conjunctivae and EOM are normal. Pupils are equal, round, and reactive to light.  Neck: Normal range of motion.  Cardiovascular: Normal rate, regular rhythm and normal heart sounds.   No murmur heard. Pulmonary/Chest: Effort normal and breath sounds normal. No respiratory distress.  Abdominal: Soft. There is tenderness.  Mild tenderness in the right lower corner without guarding. No mass.  Genitourinary: Penis normal.  Mild tenderness in the right groin area no hernia.  Musculoskeletal: Normal range of motion. He exhibits no edema.  Neurological: He is alert and oriented to person, place, and time. No cranial nerve deficit. He exhibits normal muscle tone. Coordination normal.  Skin: Skin is  warm. No rash noted. No erythema.    ED Course  Procedures (including critical care time) Labs Review Labs Reviewed  COMPREHENSIVE METABOLIC PANEL - Abnormal; Notable for the following:    Creatinine, Ser 1.55 (*)    Albumin 3.1 (*)    GFR calc non Af Amer 40 (*)    GFR calc Af Amer 47 (*)    All other components within normal limits  CBC WITH DIFFERENTIAL - Abnormal; Notable for the following:    RBC 4.21 (*)    Hemoglobin 12.3 (*)    HCT 35.0 (*)    RDW 15.6 (*)    Platelets 149 (*)    All other components within normal limits  LIPASE, BLOOD  URINALYSIS, ROUTINE W REFLEX MICROSCOPIC   Results for orders placed during the hospital encounter of 12/27/12  COMPREHENSIVE METABOLIC PANEL      Result Value Range   Sodium 144  135 - 145 mEq/L   Potassium 3.8  3.5 - 5.1 mEq/L   Chloride 109  96 - 112 mEq/L   CO2 29  19 - 32 mEq/L   Glucose, Bld 72  70 - 99 mg/dL   BUN 17  6 - 23 mg/dL   Creatinine, Ser 1.61 (*) 0.50 -  1.35 mg/dL   Calcium 8.7  8.4 - 47.8 mg/dL   Total Protein 6.5  6.0 - 8.3 g/dL   Albumin 3.1 (*) 3.5 - 5.2 g/dL   AST 26  0 - 37 U/L   ALT 24  0 - 53 U/L   Alkaline Phosphatase 74  39 - 117 U/L   Total Bilirubin 0.3  0.3 - 1.2 mg/dL   GFR calc non Af Amer 40 (*) >90 mL/min   GFR calc Af Amer 47 (*) >90 mL/min  LIPASE, BLOOD      Result Value Range   Lipase 37  11 - 59 U/L  CBC WITH DIFFERENTIAL      Result Value Range   WBC 5.1  4.0 - 10.5 K/uL   RBC 4.21 (*) 4.22 - 5.81 MIL/uL   Hemoglobin 12.3 (*) 13.0 - 17.0 g/dL   HCT 29.5 (*) 62.1 - 30.8 %   MCV 83.1  78.0 - 100.0 fL   MCH 29.2  26.0 - 34.0 pg   MCHC 35.1  30.0 - 36.0 g/dL   RDW 65.7 (*) 84.6 - 96.2 %   Platelets 149 (*) 150 - 400 K/uL   Neutrophils Relative % 44  43 - 77 %   Neutro Abs 2.3  1.7 - 7.7 K/uL   Lymphocytes Relative 43  12 - 46 %   Lymphs Abs 2.2  0.7 - 4.0 K/uL   Monocytes Relative 11  3 - 12 %   Monocytes Absolute 0.6  0.1 - 1.0 K/uL   Eosinophils Relative 1  0 - 5 %    Eosinophils Absolute 0.1  0.0 - 0.7 K/uL   Basophils Relative 0  0 - 1 %   Basophils Absolute 0.0  0.0 - 0.1 K/uL  URINALYSIS, ROUTINE W REFLEX MICROSCOPIC      Result Value Range   Color, Urine YELLOW  YELLOW   APPearance CLEAR  CLEAR   Specific Gravity, Urine 1.012  1.005 - 1.030   pH 7.0  5.0 - 8.0   Glucose, UA NEGATIVE  NEGATIVE mg/dL   Hgb urine dipstick NEGATIVE  NEGATIVE   Bilirubin Urine NEGATIVE  NEGATIVE   Ketones, ur NEGATIVE  NEGATIVE mg/dL   Protein, ur NEGATIVE  NEGATIVE mg/dL   Urobilinogen, UA 0.2  0.0 - 1.0 mg/dL   Nitrite NEGATIVE  NEGATIVE   Leukocytes, UA NEGATIVE  NEGATIVE    Imaging Review Ct Abdomen Pelvis W Contrast  12/27/2012   CLINICAL DATA:  Abdominal pain, nausea  EXAM: CT ABDOMEN AND PELVIS WITH CONTRAST  TECHNIQUE: Multidetector CT imaging of the abdomen and pelvis was performed using the standard protocol following bolus administration of intravenous contrast.  CONTRAST:  OMNIPAQUE IOHEXOL 300 MG/ML  SOLN  COMPARISON:  09/21/2012  FINDINGS: Atelectasis noted lung bases posteriorly. Significant cardiomegaly. Sagittal images of the spine shows multilevel degenerative changes lumbar spine. Extensive atherosclerotic calcifications abdominal aorta and iliac arteries.  Enhanced liver shows no biliary ductal dilatation. Small liver cyst is stable. No calcified gallstones are noted within gallbladder. The pancreas, spleen and right adrenal are unremarkable. Stable probable are the normal left adrenal gland. Bilateral renal cysts are again noted. No hydronephrosis or hydroureter. Delayed renal images shows bilateral renal symmetrical excretion. Bilateral visualized proximal ureter is unremarkable.  No small bowel obstruction.  No ascites or free air.  No adenopathy.  Again noted atherosclerotic calcification of superior and inferior mesenteric artery.  Mild distended urinary bladder. Urinary bladder diverticulum superior aspect of the  bladder anteriorly again noted  measures 2.5 cm.  Again noted significant enlargement of the prostate gland with indentation of urinary bladder base. Prostate gland measures at least 5.7 x 5.8 cm.  No destructive bony lesions are noted within pelvis. No inguinal adenopathy.  IMPRESSION: 1. No acute inflammatory process within abdomen or pelvis. Cardiomegaly again noted. Trace pericardial effusion. 2. Stable bilateral multiple renal cysts. 3. Again noted distended urinary bladder. Stable diverticulum anterior superior aspect of the bladder. 4. Again noted enlarged prostate gland with indentation of urinary bladder base. 5. Stable degenerative changes lumbar spine.   Electronically Signed   By: Natasha Mead M.D.   On: 12/27/2012 18:29    EKG Interpretation    Date/Time:  Saturday December 27 2012 14:40:40 EST Ventricular Rate:  64 PR Interval:  144 QRS Duration: 158 QT Interval:  467 QTC Calculation: 482 R Axis:   -57 Text Interpretation:  Age not entered, assumed to be  79 years old for purpose of ECG interpretation Probable left atrial enlargement Left bundle branch block No significant change since last tracing Confirmed by Elliot Meldrum  MD, Numa Heatwole ([id number]) on 12/27/2012 3:44:50 PM            MDM   1. Groin pain, right lower quadrant   2. Bladder diverticulum    Suspect pain in the right groin areas due to some nerve irritation from previous hernia repair. Today's CT does not show any recurrent hernia or mass in the area. You do have a diverticulum on your bladder followup with urology will be important for that. Patient's labs without any significant abnormalities. No leukocytosis. Would recommend followup with your primary care doctor [facility].  No evidence urinary tract infection.    Shelda Jakes, MD 12/27/12 [phone]

## 2012-12-27 NOTE — ED Notes (Signed)
He found a ride; they will be here in 20 minutes. He is ok with waiting for them in the waiting room so he can see them arrive.

## 2012-12-27 NOTE — ED Notes (Signed)
Patient transported to CT 

## 2012-12-27 NOTE — ED Notes (Signed)
Contrast finished. Notified CT.

## 2012-12-27 NOTE — ED Notes (Signed)
Reports abdominal pain with nausea, no V/D. Started 7 months ago; has been seen multiple times for this over the past 7 months. Points to the pain in right lower quadrant to right upper groin area.

## 2012-12-27 NOTE — ED Notes (Signed)
Patient continues to try to find a ride home. He is calling friends and family.

## 2012-12-27 NOTE — ED Notes (Signed)
Patient returned from CT

## 2013-01-27 IMAGING — CR DG ABDOMEN ACUTE W/ 1V CHEST
3 series · 3 of 3 positions shown · non-contrast
Comparison: Abdominal radiographs/CT dated 06/27/2009, chest
radiographs dated 07/02/2010

CLINICAL DATA: Abdominal pain, constipation

ACUTE ABDOMEN SERIES (ABDOMEN 2 VIEW & CHEST 1 VIEW)

[w chest pa]
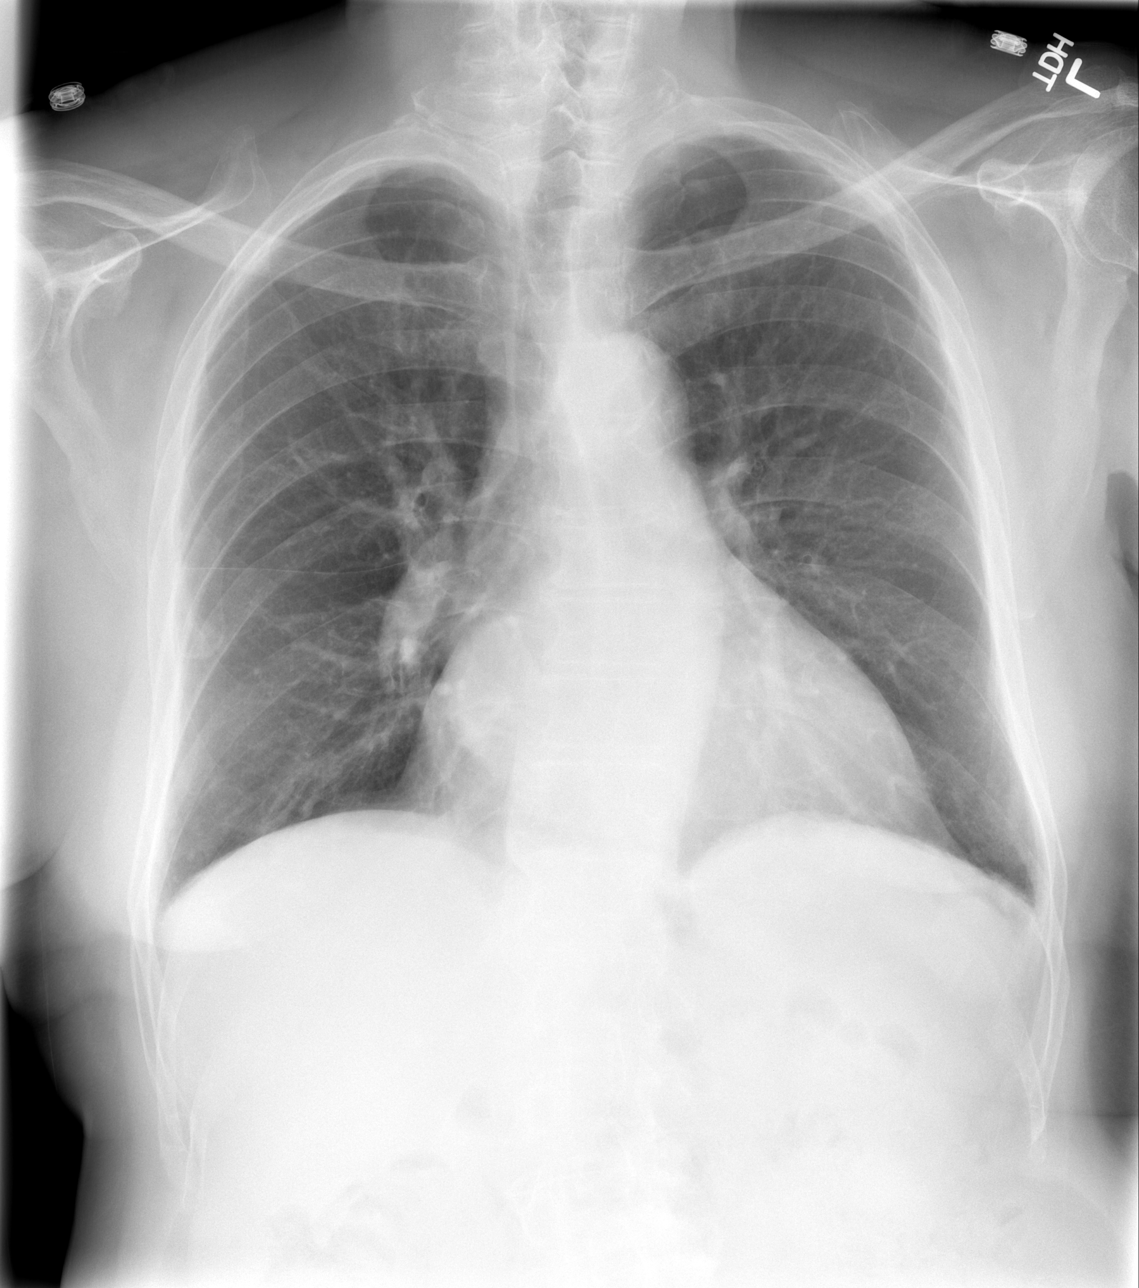

[w abdomen upright]
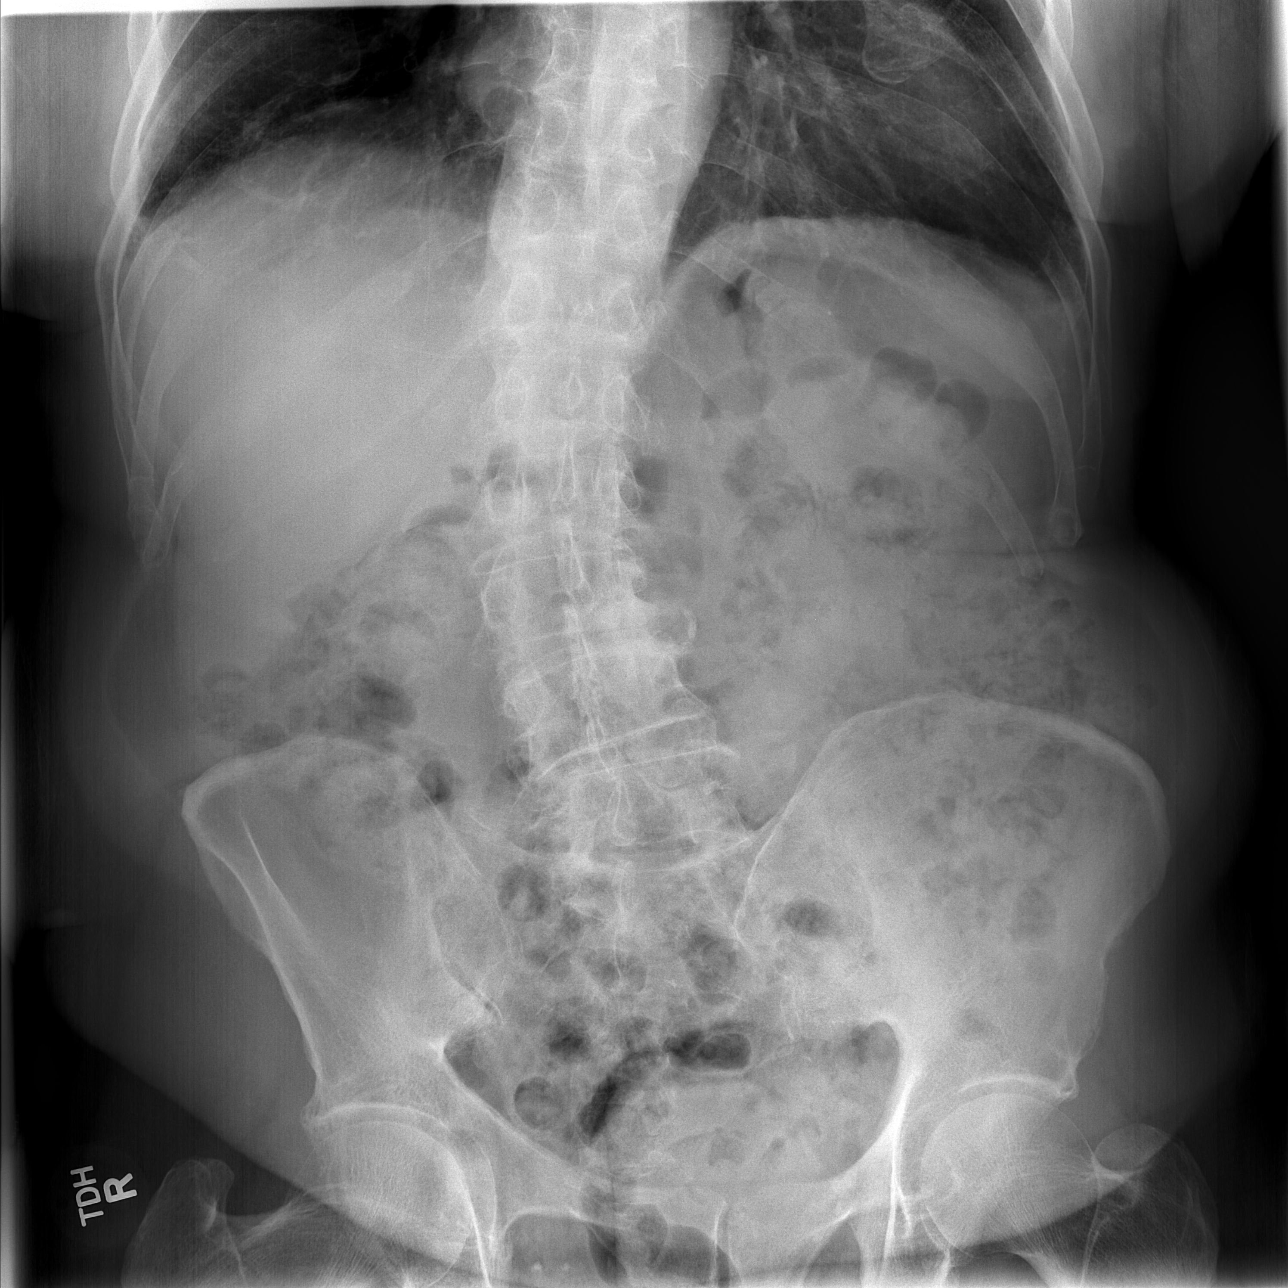

[t abdomen supine]
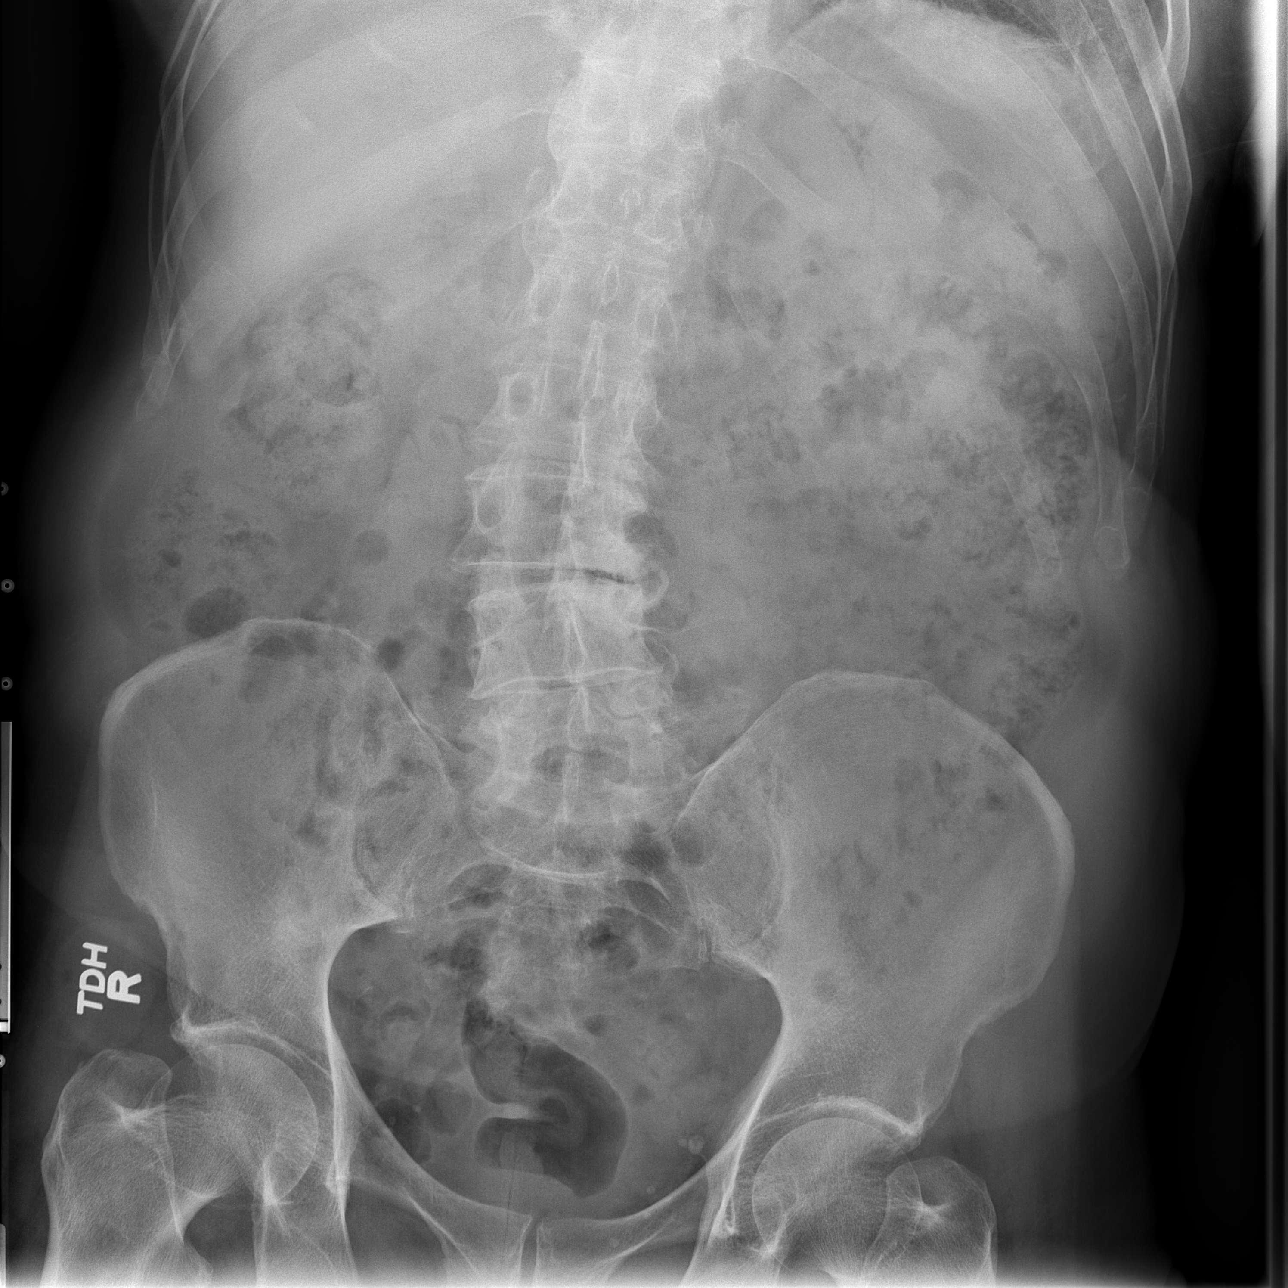

[3 of 3 positions shown; findings below may reference images not displayed]

FINDINGS: Lungs are clear. No pleural effusion or pneumothorax.

Cardiomegaly.

Nonobstructive bowel gas pattern.  Moderate stool throughout the
colon.

No evidence of free air under the diaphragm on the upright view.

Degenerative changes of the visualized thoracolumbar spine.
IMPRESSION: No evidence of acute cardiopulmonary disease.  Stable cardiomegaly.

No evidence of small bowel obstruction or free air.

## 2013-05-18 ENCOUNTER — Other Ambulatory Visit: Payer: Self-pay | Admitting: Internal Medicine

## 2013-06-22 IMAGING — CR DG CHEST 1V PORT
1 series · 1 of 1 positions shown · non-contrast
Comparison: 11/27/2010

CLINICAL DATA: Chest pain

PORTABLE CHEST - 1 VIEW

[AP]
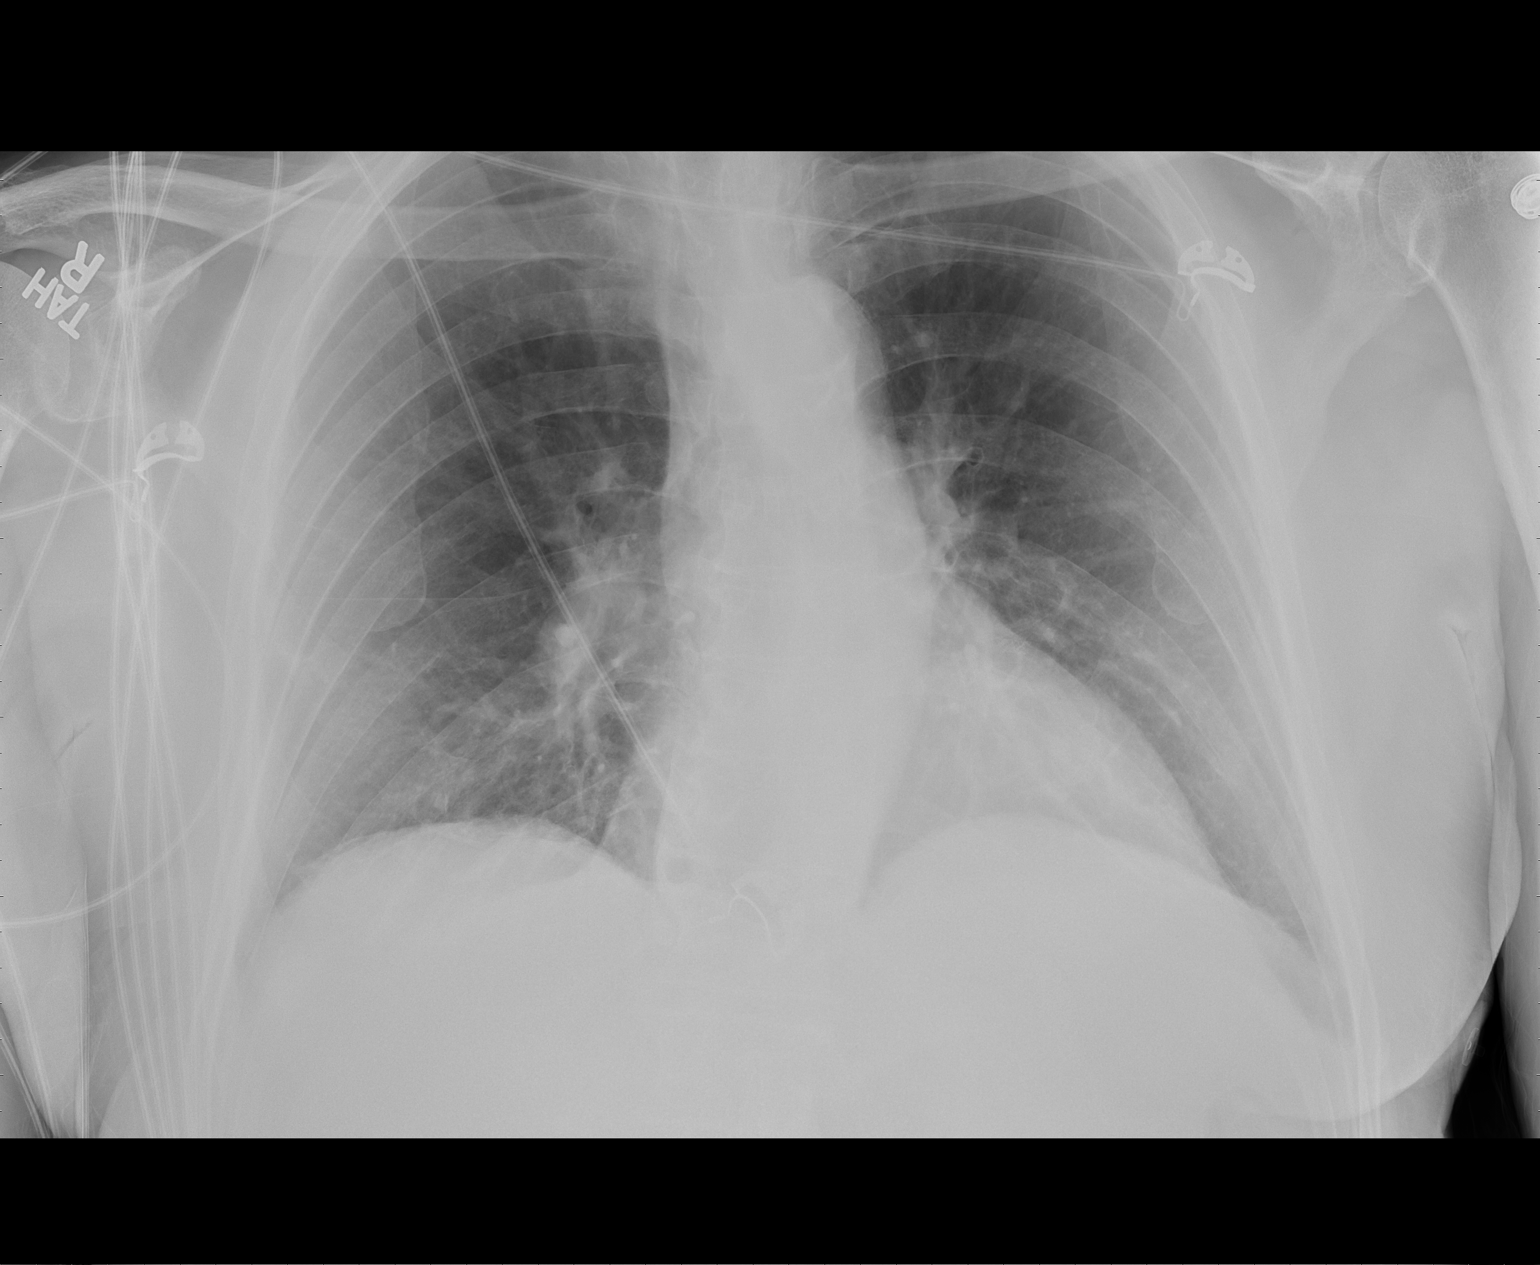

[1 of 1 positions shown; findings below may reference images not displayed]

FINDINGS: Lungs are essentially clear. No pleural effusion or
pneumothorax.

Stable mild cardiomegaly.
IMPRESSION: No evidence of acute cardiopulmonary disease.

Stable mild cardiomegaly.

## 2013-07-11 ENCOUNTER — Emergency Department (HOSPITAL_COMMUNITY): Payer: Medicare Other

## 2013-07-11 ENCOUNTER — Encounter (HOSPITAL_COMMUNITY): Payer: Self-pay | Admitting: Emergency Medicine

## 2013-07-11 ENCOUNTER — Emergency Department (HOSPITAL_COMMUNITY)
Admission: EM | Admit: 2013-07-11 | Discharge: 2013-07-11 | Disposition: A | Discharge status: Home / Self Care | Payer: Medicare Other | Attending: Emergency Medicine | Admitting: Emergency Medicine

## 2013-07-11 DIAGNOSIS — Z86711 Personal history of pulmonary embolism: Secondary | ICD-10-CM | POA: Insufficient documentation

## 2013-07-11 DIAGNOSIS — I4891 Unspecified atrial fibrillation: Secondary | ICD-10-CM | POA: Insufficient documentation

## 2013-07-11 DIAGNOSIS — I2510 Atherosclerotic heart disease of native coronary artery without angina pectoris: Secondary | ICD-10-CM | POA: Insufficient documentation

## 2013-07-11 DIAGNOSIS — I252 Old myocardial infarction: Secondary | ICD-10-CM | POA: Insufficient documentation

## 2013-07-11 DIAGNOSIS — I209 Angina pectoris, unspecified: Secondary | ICD-10-CM | POA: Insufficient documentation

## 2013-07-11 DIAGNOSIS — E785 Hyperlipidemia, unspecified: Secondary | ICD-10-CM | POA: Insufficient documentation

## 2013-07-11 DIAGNOSIS — Z9861 Coronary angioplasty status: Secondary | ICD-10-CM | POA: Insufficient documentation

## 2013-07-11 DIAGNOSIS — Z87891 Personal history of nicotine dependence: Secondary | ICD-10-CM | POA: Insufficient documentation

## 2013-07-11 DIAGNOSIS — R600 Localized edema: Secondary | ICD-10-CM

## 2013-07-11 DIAGNOSIS — E039 Hypothyroidism, unspecified: Secondary | ICD-10-CM | POA: Insufficient documentation

## 2013-07-11 DIAGNOSIS — F329 Major depressive disorder, single episode, unspecified: Secondary | ICD-10-CM | POA: Insufficient documentation

## 2013-07-11 DIAGNOSIS — E119 Type 2 diabetes mellitus without complications: Secondary | ICD-10-CM | POA: Insufficient documentation

## 2013-07-11 DIAGNOSIS — Z9089 Acquired absence of other organs: Secondary | ICD-10-CM | POA: Insufficient documentation

## 2013-07-11 DIAGNOSIS — IMO0002 Reserved for concepts with insufficient information to code with codable children: Secondary | ICD-10-CM | POA: Insufficient documentation

## 2013-07-11 DIAGNOSIS — Z7901 Long term (current) use of anticoagulants: Secondary | ICD-10-CM | POA: Insufficient documentation

## 2013-07-11 DIAGNOSIS — Z79899 Other long term (current) drug therapy: Secondary | ICD-10-CM | POA: Insufficient documentation

## 2013-07-11 DIAGNOSIS — Z86718 Personal history of other venous thrombosis and embolism: Secondary | ICD-10-CM | POA: Insufficient documentation

## 2013-07-11 DIAGNOSIS — Z8673 Personal history of transient ischemic attack (TIA), and cerebral infarction without residual deficits: Secondary | ICD-10-CM | POA: Insufficient documentation

## 2013-07-11 DIAGNOSIS — I10 Essential (primary) hypertension: Secondary | ICD-10-CM | POA: Insufficient documentation

## 2013-07-11 DIAGNOSIS — Z9889 Other specified postprocedural states: Secondary | ICD-10-CM | POA: Insufficient documentation

## 2013-07-11 DIAGNOSIS — Z8701 Personal history of pneumonia (recurrent): Secondary | ICD-10-CM | POA: Insufficient documentation

## 2013-07-11 DIAGNOSIS — F3289 Other specified depressive episodes: Secondary | ICD-10-CM | POA: Insufficient documentation

## 2013-07-11 DIAGNOSIS — J45909 Unspecified asthma, uncomplicated: Secondary | ICD-10-CM | POA: Insufficient documentation

## 2013-07-11 DIAGNOSIS — G8929 Other chronic pain: Secondary | ICD-10-CM | POA: Insufficient documentation

## 2013-07-11 DIAGNOSIS — K219 Gastro-esophageal reflux disease without esophagitis: Secondary | ICD-10-CM | POA: Insufficient documentation

## 2013-07-11 DIAGNOSIS — M109 Gout, unspecified: Secondary | ICD-10-CM | POA: Insufficient documentation

## 2013-07-11 DIAGNOSIS — F411 Generalized anxiety disorder: Secondary | ICD-10-CM | POA: Insufficient documentation

## 2013-07-11 DIAGNOSIS — R1013 Epigastric pain: Secondary | ICD-10-CM | POA: Insufficient documentation

## 2013-07-11 DIAGNOSIS — R609 Edema, unspecified: Secondary | ICD-10-CM | POA: Insufficient documentation

## 2013-07-11 DIAGNOSIS — Z8669 Personal history of other diseases of the nervous system and sense organs: Secondary | ICD-10-CM | POA: Insufficient documentation

## 2013-07-11 LAB — CBC WITH DIFFERENTIAL/PLATELET
Basophils Absolute: 0 10*3/uL (ref 0.0–0.1)
Basophils Relative: 0 % (ref 0–1)
Eosinophils Absolute: 0.1 10*3/uL (ref 0.0–0.7)
Eosinophils Relative: 2 % (ref 0–5)
HEMATOCRIT: 38 % — AB (ref 39.0–52.0)
HEMOGLOBIN: 13 g/dL (ref 13.0–17.0)
LYMPHS PCT: 31 % (ref 12–46)
Lymphs Abs: 2 10*3/uL (ref 0.7–4.0)
MCH: 28 pg (ref 26.0–34.0)
MCHC: 34.2 g/dL (ref 30.0–36.0)
MCV: 81.9 fL (ref 78.0–100.0)
MONO ABS: 0.8 10*3/uL (ref 0.1–1.0)
MONOS PCT: 12 % (ref 3–12)
NEUTROS ABS: 3.6 10*3/uL (ref 1.7–7.7)
Neutrophils Relative %: 55 % (ref 43–77)
Platelets: 146 10*3/uL — ABNORMAL LOW (ref 150–400)
RBC: 4.64 MIL/uL (ref 4.22–5.81)
RDW: 15.4 % (ref 11.5–15.5)
WBC: 6.5 10*3/uL (ref 4.0–10.5)

## 2013-07-11 LAB — COMPREHENSIVE METABOLIC PANEL
ALT: 23 U/L (ref 0–53)
AST: 28 U/L (ref 0–37)
Albumin: 3.5 g/dL (ref 3.5–5.2)
Alkaline Phosphatase: 95 U/L (ref 39–117)
BUN: 23 mg/dL (ref 6–23)
CHLORIDE: 105 meq/L (ref 96–112)
CO2: 25 meq/L (ref 19–32)
CREATININE: 1.86 mg/dL — AB (ref 0.50–1.35)
Calcium: 9.2 mg/dL (ref 8.4–10.5)
GFR calc Af Amer: 37 mL/min — ABNORMAL LOW (ref >90)
GFR calc non Af Amer: 32 mL/min — ABNORMAL LOW (ref >90)
GLUCOSE: 94 mg/dL (ref 70–99)
Potassium: 4.5 mEq/L (ref 3.7–5.3)
Sodium: 142 mEq/L (ref 137–147)
Total Bilirubin: 0.5 mg/dL (ref 0.3–1.2)
Total Protein: 7.3 g/dL (ref 6.0–8.3)

## 2013-07-11 LAB — LIPASE, BLOOD: Lipase: 44 U/L (ref 11–59)

## 2013-07-11 LAB — PROTIME-INR
INR: 2.24 — AB (ref 0.00–1.49)
PROTHROMBIN TIME: 24.1 s — AB (ref 11.6–15.2)

## 2013-07-11 LAB — TROPONIN I: Troponin I: <0.3 ng/mL (ref <0.30)

## 2013-07-11 MED ORDER — TRAMADOL HCL 50 MG PO TABS: 50.0000 mg | ORAL_TABLET | Freq: Four times a day (QID) | ORAL | Status: DC | PRN

## 2013-07-11 NOTE — ED Notes (Signed)
Went and got a wheelchair to wheel pt out to discharge

## 2013-07-11 NOTE — ED Provider Notes (Signed)
CSN: [id number]     Arrival date & time 07/11/13  1003 History   First MD Initiated Contact with Patient 07/11/13 1017     Chief Complaint  Patient presents with  . Generalized Body Aches     (Consider location/radiation/quality/duration/timing/severity/associated sxs/prior Treatment) HPI Comments: Patient is an 80 year old male with past medical history significant for coronary artery disease with 4 stents, diabetes, pulmonary embolism. He presents today with complaints of discomfort in his chest and abdomen that has been worsening for several days. He denies he is having any shortness of breath, fever, diaphoresis, nausea, or radiation to the arm or jaw.   Patient is a 80 y.o. male presenting with abdominal pain. The history is provided by the patient.  Abdominal Pain Pain location:  Epigastric Pain quality: cramping   Pain radiates to:  Does not radiate Pain severity:  Moderate Onset quality:  Gradual Duration:  3 days Timing:  Constant Progression:  Worsening Relieved by:  Nothing Worsened by:  Nothing tried Ineffective treatments:  None tried   Past Medical History  Diagnosis Date  . GERD (gastroesophageal reflux disease)   . Chronic back pain   . Gout     no problems in 2 years  . Pulmonary embolism, bilateral 2010  . Hyperlipemia   . Myocardial infarction 1987; 1989  . Anginal pain   . DVT, lower extremity 1980's    LLE  . Asthma     "touch"  . Pneumonia 1970's  . Arthritis     "mostly in my knees and shoulders"  . Collapsed lung     "for 14 years" right   . Diabetes mellitus   . Hypertension   . Depression   . Anxiety   . Hearing loss   . Glaucoma   . Complication of anesthesia   . PONV (postoperative nausea and vomiting)   . Renal disorder ~ 03/2011    "wasn't functioning right"  . Stroke   . Stroke     "just a little one"  . Hypothyroidism    Past Surgical History  Procedure Laterality Date  . Esophagogastroduodenoscopy  12/22/2010   Procedure: ESOPHAGOGASTRODUODENOSCOPY (EGD);  Surgeon: Theda BelfastPatrick D Hung;  Location: [facility];  Service: Endoscopy;  Laterality: N/A;  . Appendectomy  1950  . Cataract extraction w/ intraocular lens  implant, bilateral Bilateral 1990's  . Coronary angioplasty with stent placement      "think I have 4 total"  . Inguinal hernia repair Bilateral 03/28/2012    Procedure: LAPAROSCOPIC BILATERAL INGUINAL HERNIA REPAIR;  Surgeon: Ardeth SportsmanSteven C. Gross, MD;  Location: [facility];  Service: General;  Laterality: Bilateral;  . Insertion of mesh Bilateral 03/28/2012    Procedure: INSERTION OF MESH;  Surgeon: Ardeth SportsmanSteven C. Gross, MD;  Location: [facility];  Service: General;  Laterality: Bilateral;  . Hernia repair    . Esophagogastroduodenoscopy N/A 04/30/2012    Procedure: ESOPHAGOGASTRODUODENOSCOPY (EGD);  Surgeon: Theda BelfastPatrick D Hung, MD;  [facility];  Service: Endoscopy;  Laterality: N/A;  . Colonoscopy N/A 05/01/2012    Procedure: COLONOSCOPY;  Surgeon: Theda BelfastPatrick D Hung, MD;  [facility];  Service: Endoscopy;  Laterality: N/A;  . Cardiac catheterization     Family History  Problem Relation Age of Onset  . Diabetes Mother   . Heart disease Father   . Lung cancer Brother   . Stroke Neg Hx   . Hypertension Neg Hx   . Hyperlipidemia Neg Hx   . Alcohol abuse Neg Hx    History  Substance Use Topics  . Smoking status: Former Smoker -- 2.00 packs/day for 3 years    Types: Cigarettes    Start date: 01/27/1946    Quit date: 01/22/1981  . Smokeless tobacco: Never Used  . Alcohol Use: No     Comment: 07/12/2011; "last drink of alcohol was ~ 1983"    Review of Systems  Gastrointestinal: Positive for abdominal pain.  All other systems reviewed and are negative.     Allergies  Review of patient's allergies indicates no known allergies.  Home Medications   Prior to Admission medications   Medication Sig Start Date End Date Taking? Authorizing Provider  acetaminophen (TYLENOL) 500 MG tablet Take  1,000 mg by mouth every 6 (six) hours as needed for pain.    Historical Provider, MD  albuterol (PROVENTIL HFA;VENTOLIN HFA) 108 (90 BASE) MCG/ACT inhaler Inhale 2 puffs into the lungs every 6 (six) hours as needed for shortness of breath.     Historical Provider, MD  alum & mag hydroxide-simeth (MAALOX/MYLANTA) 200-200-20 MG/5ML suspension Take 15 mLs by mouth every 6 (six) hours as needed for indigestion or heartburn.    Historical Provider, MD  amiodarone (PACERONE) 200 MG tablet Take 200 mg by mouth daily.    Historical Provider, MD  atorvastatin (LIPITOR) 40 MG tablet Take 40 mg by mouth daily.    Historical Provider, MD  beclomethasone (QVAR) 80 MCG/ACT inhaler Inhale 2 puffs into the lungs 2 (two) times daily.    Historical Provider, MD  carvedilol (COREG) 3.125 MG tablet Take 3.125 mg by mouth 2 (two) times daily with a meal.    Historical Provider, MD  dicyclomine (BENTYL) 10 MG capsule Take 10 mg by mouth 3 (three) times daily before meals.    Historical Provider, MD  ferrous sulfate 325 (65 FE) MG tablet Take 325 mg by mouth 3 (three) times daily with meals.    Historical Provider, MD  furosemide (LASIX) 40 MG tablet Take 40 mg by mouth daily.     Historical Provider, MD  glimepiride (AMARYL) 1 MG tablet Take 1 mg by mouth daily before breakfast.    Historical Provider, MD  hydroxypropyl methylcellulose (ISOPTO TEARS) 2.5 % ophthalmic solution Place 1 drop into both eyes 2 (two) times daily as needed (dry eyes).    Historical Provider, MD  isosorbide mononitrate (IMDUR) 60 MG 24 hr tablet Take 60 mg by mouth daily.    Historical Provider, MD  levothyroxine (SYNTHROID, LEVOTHROID) 50 MCG tablet Take 50 mcg by mouth daily before breakfast.     Historical Provider, MD  losartan (COZAAR) 100 MG tablet Take 100 mg by mouth daily.    Historical Provider, MD  nitroGLYCERIN (NITROSTAT) 0.4 MG SL tablet Place 0.4 mg under the tongue every 5 (five) minutes as needed for chest pain.    Historical  Provider, MD  omeprazole (PRILOSEC) 20 MG capsule Take 20 mg by mouth daily.    Historical Provider, MD  ranitidine (ZANTAC) 300 MG capsule Take 300 mg by mouth every evening.    Historical Provider, MD  tamsulosin (FLOMAX) 0.4 MG CAPS capsule Take 0.4 mg by mouth daily after supper.    Historical Provider, MD  traMADol (ULTRAM) 50 MG tablet Take 1 tablet (50 mg total) by mouth every 6 (six) hours as needed. 12/27/12   Vanetta Mulders, MD  warfarin (COUMADIN) 4 MG tablet Take 4 mg by mouth daily.     Historical Provider, MD   BP 190/83  Pulse 62  Temp(Src) 98.9  F (37.2 C) (Oral)  Resp 17  SpO2 97% Physical Exam  Nursing note and vitals reviewed. Constitutional: He is oriented to person, place, and time. He appears well-developed and well-nourished. No distress.  HENT:  Head: Normocephalic and atraumatic.  Mouth/Throat: Oropharynx is clear and moist.  Neck: Normal range of motion. Neck supple.  Cardiovascular: Normal rate, regular rhythm and normal heart sounds.   No murmur heard. Pulmonary/Chest: Effort normal and breath sounds normal. No respiratory distress. He has no wheezes.  There is tenderness to palpation in the left lower chest wall.  Abdominal: Soft. Bowel sounds are normal. He exhibits no distension. There is no tenderness.  There is tenderness to palpation in the epigastrium with no rebound and no guarding.  Musculoskeletal: Normal range of motion. He exhibits no edema.  Neurological: He is alert and oriented to person, place, and time.  Skin: Skin is warm and dry. He is not diaphoretic.    ED Course  Procedures (including critical care time) Labs Review Labs Reviewed - No data to display  Imaging Review No results found.   EKG Interpretation   Date/Time:  Saturday July 11 2013 10:12:48 EDT Ventricular Rate:  56 PR Interval:  146 QRS Duration: 165 QT Interval:  498 QTC Calculation: 481 R Axis:   78 Text Interpretation:  Sinus bradycardia IVCD, consider  atypical LBBB  Confirmed by DELOS  MD, Meer Reindl ([id number]) on 07/11/2013 10:30:12 AM      MDM   Final diagnoses:  None    Patient presents here with complaints of generalized pain for the past several months and has worsened over the past several weeks. He is somewhat of a poor historian.  Workup today reveals no evidence for a cardiac or pulmonary etiology. He does have some swelling in his lower extremities that he states is out of proportion with his norm. I suspect edema may be the cause of his discomfort and difficulty walking he is experiencing. I will increase his Lasix to twice daily for the next several days. He is also requested something for pain. He will be prescribed tramadol. At this point I feel as though he is appropriate for discharge. He understands to return if his symptoms worsen or change.    Geoffery Lyonsouglas Imre Vecchione, MD 07/11/13 1314

## 2013-07-11 NOTE — ED Notes (Signed)
Pt unable to urinate at this time.  

## 2013-07-11 NOTE — Discharge Instructions (Signed)
Increase her Lasix to 40 mg twice daily. Tramadol as prescribed as needed for pain.  Return to the emergency department if you develop difficulty breathing, chest pain, fever, or other new and concerning symptoms.   Peripheral Edema You have swelling in your legs (peripheral edema). This swelling is due to excess accumulation of salt and water in your body. Edema may be a sign of heart, kidney or liver disease, or a side effect of a medication. It may also be due to problems in the leg veins. Elevating your legs and using special support stockings may be very helpful, if the cause of the swelling is due to poor venous circulation. Avoid long periods of standing, whatever the cause. Treatment of edema depends on identifying the cause. Chips, pretzels, pickles and other salty foods should be avoided. Restricting salt in your diet is almost always needed. Water pills (diuretics) are often used to remove the excess salt and water from your body via urine. These medicines prevent the kidney from reabsorbing sodium. This increases urine flow. Diuretic treatment may also result in lowering of potassium levels in your body. Potassium supplements may be needed if you have to use diuretics daily. Daily weights can help you keep track of your progress in clearing your edema. You should call your caregiver for follow up care as recommended. SEEK IMMEDIATE MEDICAL CARE IF:   You have increased swelling, pain, redness, or heat in your legs.  You develop shortness of breath, especially when lying down.  You develop chest or abdominal pain, weakness, or fainting.  You have a fever. Document Released: 02/16/2004 Document Revised: 04/02/2011 Document Reviewed: 01/26/2009 [facility] Patient Information 2015 [employer] [place]. This information is not intended to replace advice given to you by your health care provider. Make sure you discuss any questions you have with your health care provider.

## 2013-07-11 NOTE — ED Notes (Signed)
Pt has returned from being out of the department; pt placed back on monitor, continuous pulse oximetry and blood pressure cuff 

## 2013-07-11 NOTE — ED Notes (Signed)
Pt discharged to home with family. NAD.  

## 2013-07-11 NOTE — ED Notes (Signed)
Pt undressed, in gown, on continuous pulse oximetry and blood pressure cuff 

## 2013-07-11 NOTE — ED Notes (Signed)
Pt presents to ED via EMS from home with complaints of pain all over for the past couple months but has been getting worse over the past couple weeks. Pt states he has arthritis but this pain is different. Pt reports midsternal chest pain and upper abdominal pain.

## 2013-07-11 NOTE — ED Notes (Signed)
Pt handed an urinal to use 

## 2013-07-15 ENCOUNTER — Emergency Department (HOSPITAL_COMMUNITY): Payer: Medicare Other

## 2013-07-15 ENCOUNTER — Encounter (HOSPITAL_COMMUNITY): Payer: Self-pay | Admitting: Emergency Medicine

## 2013-07-15 ENCOUNTER — Other Ambulatory Visit: Payer: Self-pay

## 2013-07-15 ENCOUNTER — Observation Stay (HOSPITAL_COMMUNITY)
Admission: EM | Admit: 2013-07-15 | Discharge: 2013-07-17 | Disposition: A | Discharge status: Home / Self Care | Payer: Medicare Other | Attending: Cardiology | Admitting: Cardiology

## 2013-07-15 DIAGNOSIS — E039 Hypothyroidism, unspecified: Secondary | ICD-10-CM | POA: Insufficient documentation

## 2013-07-15 DIAGNOSIS — I209 Angina pectoris, unspecified: Secondary | ICD-10-CM | POA: Insufficient documentation

## 2013-07-15 DIAGNOSIS — Z7901 Long term (current) use of anticoagulants: Secondary | ICD-10-CM | POA: Insufficient documentation

## 2013-07-15 DIAGNOSIS — H409 Unspecified glaucoma: Secondary | ICD-10-CM | POA: Insufficient documentation

## 2013-07-15 DIAGNOSIS — Z86718 Personal history of other venous thrombosis and embolism: Secondary | ICD-10-CM | POA: Insufficient documentation

## 2013-07-15 DIAGNOSIS — E785 Hyperlipidemia, unspecified: Secondary | ICD-10-CM | POA: Insufficient documentation

## 2013-07-15 DIAGNOSIS — F3289 Other specified depressive episodes: Secondary | ICD-10-CM | POA: Insufficient documentation

## 2013-07-15 DIAGNOSIS — R079 Chest pain, unspecified: Secondary | ICD-10-CM

## 2013-07-15 DIAGNOSIS — Z8701 Personal history of pneumonia (recurrent): Secondary | ICD-10-CM | POA: Insufficient documentation

## 2013-07-15 DIAGNOSIS — N289 Disorder of kidney and ureter, unspecified: Secondary | ICD-10-CM | POA: Insufficient documentation

## 2013-07-15 DIAGNOSIS — Z8673 Personal history of transient ischemic attack (TIA), and cerebral infarction without residual deficits: Secondary | ICD-10-CM | POA: Insufficient documentation

## 2013-07-15 DIAGNOSIS — R0789 Other chest pain: Principal | ICD-10-CM | POA: Insufficient documentation

## 2013-07-15 DIAGNOSIS — I252 Old myocardial infarction: Secondary | ICD-10-CM | POA: Insufficient documentation

## 2013-07-15 DIAGNOSIS — J9819 Other pulmonary collapse: Secondary | ICD-10-CM | POA: Insufficient documentation

## 2013-07-15 DIAGNOSIS — Z86711 Personal history of pulmonary embolism: Secondary | ICD-10-CM | POA: Insufficient documentation

## 2013-07-15 DIAGNOSIS — J45909 Unspecified asthma, uncomplicated: Secondary | ICD-10-CM | POA: Insufficient documentation

## 2013-07-15 DIAGNOSIS — I10 Essential (primary) hypertension: Secondary | ICD-10-CM | POA: Insufficient documentation

## 2013-07-15 DIAGNOSIS — Z87891 Personal history of nicotine dependence: Secondary | ICD-10-CM | POA: Insufficient documentation

## 2013-07-15 DIAGNOSIS — M19019 Primary osteoarthritis, unspecified shoulder: Secondary | ICD-10-CM | POA: Insufficient documentation

## 2013-07-15 DIAGNOSIS — IMO0002 Reserved for concepts with insufficient information to code with codable children: Secondary | ICD-10-CM

## 2013-07-15 DIAGNOSIS — I200 Unstable angina: Secondary | ICD-10-CM | POA: Diagnosis present

## 2013-07-15 DIAGNOSIS — Z9861 Coronary angioplasty status: Secondary | ICD-10-CM | POA: Insufficient documentation

## 2013-07-15 DIAGNOSIS — Z79899 Other long term (current) drug therapy: Secondary | ICD-10-CM | POA: Insufficient documentation

## 2013-07-15 DIAGNOSIS — E119 Type 2 diabetes mellitus without complications: Secondary | ICD-10-CM | POA: Insufficient documentation

## 2013-07-15 DIAGNOSIS — M109 Gout, unspecified: Secondary | ICD-10-CM | POA: Insufficient documentation

## 2013-07-15 DIAGNOSIS — F411 Generalized anxiety disorder: Secondary | ICD-10-CM | POA: Insufficient documentation

## 2013-07-15 DIAGNOSIS — K219 Gastro-esophageal reflux disease without esophagitis: Secondary | ICD-10-CM | POA: Insufficient documentation

## 2013-07-15 DIAGNOSIS — G8929 Other chronic pain: Secondary | ICD-10-CM | POA: Insufficient documentation

## 2013-07-15 DIAGNOSIS — H9190 Unspecified hearing loss, unspecified ear: Secondary | ICD-10-CM | POA: Insufficient documentation

## 2013-07-15 DIAGNOSIS — F329 Major depressive disorder, single episode, unspecified: Secondary | ICD-10-CM | POA: Insufficient documentation

## 2013-07-15 DIAGNOSIS — M1710 Unilateral primary osteoarthritis, unspecified knee: Secondary | ICD-10-CM | POA: Insufficient documentation

## 2013-07-15 DIAGNOSIS — I4891 Unspecified atrial fibrillation: Secondary | ICD-10-CM | POA: Insufficient documentation

## 2013-07-15 HISTORY — DX: Other chronic pain: G89.29

## 2013-07-15 HISTORY — DX: Paroxysmal atrial fibrillation: I48.0

## 2013-07-15 HISTORY — DX: Unspecified abdominal pain: R10.9

## 2013-07-15 LAB — CBC WITH DIFFERENTIAL/PLATELET
BASOS PCT: 0 % (ref 0–1)
Basophils Absolute: 0 10*3/uL (ref 0.0–0.1)
Basophils Absolute: 0 10*3/uL (ref 0.0–0.1)
Basophils Relative: 0 % (ref 0–1)
Eosinophils Absolute: 0.1 10*3/uL (ref 0.0–0.7)
Eosinophils Absolute: 0.1 10*3/uL (ref 0.0–0.7)
Eosinophils Relative: 2 % (ref 0–5)
Eosinophils Relative: 2 % (ref 0–5)
HEMATOCRIT: 37.2 % — AB (ref 39.0–52.0)
HEMATOCRIT: 41.2 % (ref 39.0–52.0)
HEMOGLOBIN: 12.7 g/dL — AB (ref 13.0–17.0)
HEMOGLOBIN: 14 g/dL (ref 13.0–17.0)
LYMPHS ABS: 2.2 10*3/uL (ref 0.7–4.0)
LYMPHS PCT: 29 % (ref 12–46)
LYMPHS PCT: 33 % (ref 12–46)
Lymphs Abs: 2 10*3/uL (ref 0.7–4.0)
MCH: 28.2 pg (ref 26.0–34.0)
MCH: 28.9 pg (ref 26.0–34.0)
MCHC: 34.1 g/dL (ref 30.0–36.0)
MCHC: 34 g/dL (ref 30.0–36.0)
MCV: 82.5 fL (ref 78.0–100.0)
MCV: 84.9 fL (ref 78.0–100.0)
MONO ABS: 0.7 10*3/uL (ref 0.1–1.0)
MONO ABS: 0.7 10*3/uL (ref 0.1–1.0)
MONOS PCT: 10 % (ref 3–12)
Monocytes Relative: 11 % (ref 3–12)
NEUTROS ABS: 4 10*3/uL (ref 1.7–7.7)
NEUTROS ABS: 3.7 10*3/uL (ref 1.7–7.7)
NEUTROS PCT: 58 % (ref 43–77)
NEUTROS PCT: 55 % (ref 43–77)
Platelets: 130 10*3/uL — ABNORMAL LOW (ref 150–400)
Platelets: 123 10*3/uL — ABNORMAL LOW (ref 150–400)
RBC: 4.51 MIL/uL (ref 4.22–5.81)
RBC: 4.85 MIL/uL (ref 4.22–5.81)
RDW: 15.7 % — ABNORMAL HIGH (ref 11.5–15.5)
RDW: 15.9 % — ABNORMAL HIGH (ref 11.5–15.5)
WBC: 6.8 10*3/uL (ref 4.0–10.5)
WBC: 6.7 10*3/uL (ref 4.0–10.5)

## 2013-07-15 LAB — COMPREHENSIVE METABOLIC PANEL
ALK PHOS: 108 U/L (ref 39–117)
ALT: 25 U/L (ref 0–53)
AST: 27 U/L (ref 0–37)
Albumin: 3.6 g/dL (ref 3.5–5.2)
BILIRUBIN TOTAL: 0.5 mg/dL (ref 0.3–1.2)
BUN: 26 mg/dL — AB (ref 6–23)
CHLORIDE: 105 meq/L (ref 96–112)
CO2: 23 meq/L (ref 19–32)
Calcium: 9.2 mg/dL (ref 8.4–10.5)
Creatinine, Ser: 1.86 mg/dL — ABNORMAL HIGH (ref 0.50–1.35)
GFR calc non Af Amer: 32 mL/min — ABNORMAL LOW (ref >90)
GFR, EST AFRICAN AMERICAN: 37 mL/min — AB (ref >90)
GLUCOSE: 86 mg/dL (ref 70–99)
POTASSIUM: 4.2 meq/L (ref 3.7–5.3)
SODIUM: 142 meq/L (ref 137–147)
Total Protein: 7.7 g/dL (ref 6.0–8.3)

## 2013-07-15 LAB — GLUCOSE, CAPILLARY
Glucose-Capillary: 104 mg/dL — ABNORMAL HIGH (ref 70–99)
Glucose-Capillary: 121 mg/dL — ABNORMAL HIGH (ref 70–99)

## 2013-07-15 LAB — BASIC METABOLIC PANEL
BUN: 29 mg/dL — ABNORMAL HIGH (ref 6–23)
CHLORIDE: 104 meq/L (ref 96–112)
CO2: 24 meq/L (ref 19–32)
CREATININE: 2.06 mg/dL — AB (ref 0.50–1.35)
Calcium: 9.4 mg/dL (ref 8.4–10.5)
GFR calc non Af Amer: 29 mL/min — ABNORMAL LOW (ref >90)
GFR, EST AFRICAN AMERICAN: 33 mL/min — AB (ref >90)
Glucose, Bld: 98 mg/dL (ref 70–99)
POTASSIUM: 4.1 meq/L (ref 3.7–5.3)
Sodium: 142 mEq/L (ref 137–147)

## 2013-07-15 LAB — TSH: TSH: 4.54 u[IU]/mL — ABNORMAL HIGH (ref 0.350–4.500)

## 2013-07-15 LAB — TROPONIN I
Troponin I: <0.3 ng/mL (ref <0.30)
Troponin I: <0.3 ng/mL (ref <0.30)

## 2013-07-15 LAB — PROTIME-INR
INR: 2.16 — AB (ref 0.00–1.49)
PROTHROMBIN TIME: 24.1 s — AB (ref 11.6–15.2)

## 2013-07-15 LAB — HEMOGLOBIN A1C
Hgb A1c MFr Bld: 6.2 % — ABNORMAL HIGH (ref <5.7)
Mean Plasma Glucose: 131 mg/dL — ABNORMAL HIGH (ref <117)

## 2013-07-15 LAB — PRO B NATRIURETIC PEPTIDE: Pro B Natriuretic peptide (BNP): 390 pg/mL (ref 0–450)

## 2013-07-15 LAB — MAGNESIUM: Magnesium: 2 mg/dL (ref 1.5–2.5)

## 2013-07-15 MED ORDER — FAMOTIDINE 10 MG PO TABS
10.0000 mg | ORAL_TABLET | Freq: Two times a day (BID) | ORAL | Status: DC
  Administered 2013-07-15 – 2013-07-16 (×4): 10 mg via ORAL
  Filled 2013-07-15 (×6): qty 1

## 2013-07-15 MED ORDER — ONDANSETRON HCL 4 MG/2ML IJ SOLN: 4.0000 mg | Freq: Four times a day (QID) | INTRAMUSCULAR | Status: DC | PRN

## 2013-07-15 MED ORDER — PNEUMOCOCCAL VAC POLYVALENT 25 MCG/0.5ML IJ INJ
0.5000 mL | INJECTION | INTRAMUSCULAR | Status: DC
  Filled 2013-07-15: qty 0.5

## 2013-07-15 MED ORDER — ASPIRIN 81 MG PO CHEW: 324.0000 mg | CHEWABLE_TABLET | ORAL | Status: AC

## 2013-07-15 MED ORDER — NITROGLYCERIN 0.4 MG SL SUBL: 0.4000 mg | SUBLINGUAL_TABLET | SUBLINGUAL | Status: DC | PRN

## 2013-07-15 MED ORDER — ATORVASTATIN CALCIUM 40 MG PO TABS
40.0000 mg | ORAL_TABLET | Freq: Every day | ORAL | Status: DC
  Administered 2013-07-15 – 2013-07-17 (×3): 40 mg via ORAL
  Filled 2013-07-15 (×3): qty 1

## 2013-07-15 MED ORDER — INSULIN ASPART 100 UNIT/ML ~~LOC~~ SOLN: 0.0000 [IU] | Freq: Three times a day (TID) | SUBCUTANEOUS | Status: DC

## 2013-07-15 MED ORDER — POLYVINYL ALCOHOL 1.4 % OP SOLN
1.0000 [drp] | Freq: Two times a day (BID) | OPHTHALMIC | Status: DC | PRN
  Filled 2013-07-15: qty 15

## 2013-07-15 MED ORDER — SPIRONOLACTONE 25 MG PO TABS
25.0000 mg | ORAL_TABLET | Freq: Every day | ORAL | Status: DC
  Administered 2013-07-15 – 2013-07-17 (×3): 25 mg via ORAL
  Filled 2013-07-15 (×3): qty 1

## 2013-07-15 MED ORDER — CARVEDILOL 3.125 MG PO TABS
3.1250 mg | ORAL_TABLET | Freq: Two times a day (BID) | ORAL | Status: DC
  Administered 2013-07-15: 3.125 mg via ORAL
  Filled 2013-07-15 (×4): qty 1

## 2013-07-15 MED ORDER — NITROGLYCERIN 0.4 MG SL SUBL
0.4000 mg | SUBLINGUAL_TABLET | SUBLINGUAL | Status: DC | PRN
  Administered 2013-07-15 (×2): 0.4 mg via SUBLINGUAL
  Filled 2013-07-15: qty 1

## 2013-07-15 MED ORDER — PANTOPRAZOLE SODIUM 40 MG PO TBEC
40.0000 mg | DELAYED_RELEASE_TABLET | Freq: Every day | ORAL | Status: DC
  Administered 2013-07-16 – 2013-07-17 (×2): 40 mg via ORAL
  Filled 2013-07-15 (×2): qty 1

## 2013-07-15 MED ORDER — ACETAMINOPHEN 325 MG PO TABS
650.0000 mg | ORAL_TABLET | ORAL | Status: DC | PRN
  Administered 2013-07-17: 650 mg via ORAL
  Filled 2013-07-15: qty 2

## 2013-07-15 MED ORDER — HYPROMELLOSE (GONIOSCOPIC) 2.5 % OP SOLN
1.0000 [drp] | Freq: Two times a day (BID) | OPHTHALMIC | Status: DC | PRN
  Filled 2013-07-15: qty 15

## 2013-07-15 MED ORDER — LEVOTHYROXINE SODIUM 50 MCG PO TABS
50.0000 ug | ORAL_TABLET | Freq: Every day | ORAL | Status: DC
  Administered 2013-07-16 – 2013-07-17 (×2): 50 ug via ORAL
  Filled 2013-07-15 (×3): qty 1

## 2013-07-15 MED ORDER — ALBUTEROL SULFATE HFA 108 (90 BASE) MCG/ACT IN AERS: 2.0000 | INHALATION_SPRAY | Freq: Four times a day (QID) | RESPIRATORY_TRACT | Status: DC | PRN

## 2013-07-15 MED ORDER — ASPIRIN EC 81 MG PO TBEC
81.0000 mg | DELAYED_RELEASE_TABLET | Freq: Every day | ORAL | Status: DC
  Administered 2013-07-16 – 2013-07-17 (×2): 81 mg via ORAL
  Filled 2013-07-15 (×2): qty 1

## 2013-07-15 MED ORDER — ASPIRIN 300 MG RE SUPP: 300.0000 mg | RECTAL | Status: AC

## 2013-07-15 MED ORDER — NITROGLYCERIN 2 % TD OINT
0.5000 [in_us] | TOPICAL_OINTMENT | Freq: Four times a day (QID) | TRANSDERMAL | Status: DC
  Administered 2013-07-15 – 2013-07-16 (×3): 0.5 [in_us] via TOPICAL
  Filled 2013-07-15: qty 30

## 2013-07-15 MED ORDER — AMIODARONE HCL 200 MG PO TABS
200.0000 mg | ORAL_TABLET | Freq: Every day | ORAL | Status: DC
  Administered 2013-07-15 – 2013-07-17 (×3): 200 mg via ORAL
  Filled 2013-07-15 (×3): qty 1

## 2013-07-15 MED ORDER — LOSARTAN POTASSIUM 50 MG PO TABS
100.0000 mg | ORAL_TABLET | Freq: Every day | ORAL | Status: DC
  Administered 2013-07-15 – 2013-07-17 (×3): 100 mg via ORAL
  Filled 2013-07-15 (×3): qty 2

## 2013-07-15 MED ORDER — PILOCARPINE HCL 4 % OP SOLN
1.0000 [drp] | Freq: Four times a day (QID) | OPHTHALMIC | Status: DC
  Administered 2013-07-15 – 2013-07-17 (×6): 1 [drp] via OPHTHALMIC
  Filled 2013-07-15: qty 15

## 2013-07-15 MED ORDER — ALBUTEROL SULFATE (2.5 MG/3ML) 0.083% IN NEBU: 2.5000 mg | INHALATION_SOLUTION | Freq: Four times a day (QID) | RESPIRATORY_TRACT | Status: DC | PRN

## 2013-07-15 MED ORDER — MORPHINE SULFATE 4 MG/ML IJ SOLN
4.0000 mg | INTRAMUSCULAR | Status: DC | PRN
  Administered 2013-07-15: 4 mg via INTRAVENOUS
  Filled 2013-07-15: qty 1

## 2013-07-15 MED ORDER — LOSARTAN POTASSIUM 50 MG PO TABS: 100.0000 mg | ORAL_TABLET | Freq: Every day | ORAL | Status: DC

## 2013-07-15 MED ORDER — FUROSEMIDE 40 MG PO TABS
40.0000 mg | ORAL_TABLET | Freq: Every day | ORAL | Status: DC
  Administered 2013-07-15 – 2013-07-17 (×3): 40 mg via ORAL
  Filled 2013-07-15 (×3): qty 1

## 2013-07-15 NOTE — ED Notes (Addendum)
Pt from independent living via [facility] with c/o generalized body pain, mostly left knee.  Pt also c/o chest pain was seen here for the same on Fri.  Pt tried taking "pain pills" prescribed last Fri for chest pain with no relief.  Pt does not take his home nitroglycerin tablets, he was "hoping it would go away."  Pt given 324 mg aspirin, and 2 nitroglycerin with decreased chest pain from 10/10 to 6/10.  Pt in NAD. Alert.

## 2013-07-15 NOTE — H&P (Signed)
[name] is an 80 y.o. male.   Chief Complaint: Recurrent chest pain HPI: Patient is 80 year old male with past medical history significant for coronary artery disease history of MI x2 in the past status post PCI to LAD left circumflex and RCA in the past, ischemic cardiomyopathy, history of congestive heart failure secondary to systolic dysfunction, history of bilateral pulmonary embolism, history of paroxysmal A. fib, hypercholesteremia, non-insulin-dependent diabetes matters, history of DVT in the past, remote tobacco abuse, chronic kidney disease stage III, hypothyroidism, history of pancreatitis in the past, GERD, chronic abdominal pain, head extensive GI workup in the past came to the ER complaining of recurrent retrosternal chest pain described as pressure tightness radiating to left shoulder off and on for last few days received sublingual nitroglycerin and aspirin by EMS with partial relief of chest pain states chest pain was grade 10 over 10. Denies any palpitation lightheadedness or syncope. Denies PND orthopnea but complains of mild leg swelling. Denies any cough fever chills. Denies relation of chest pain to food breathing or movement.  Past Medical History  Diagnosis Date  . GERD (gastroesophageal reflux disease)   . Chronic back pain   . Gout     no problems in 2 years  . Pulmonary embolism, bilateral 2010  . Hyperlipemia   . Myocardial infarction 1987; 1989  . Anginal pain   . DVT, lower extremity 1980's    LLE  . Asthma     "touch"  . Pneumonia 1970's  . Arthritis     "mostly in my knees and shoulders"  . Collapsed lung     "for 14 years" right   . Diabetes mellitus   . Hypertension   . Depression   . Anxiety   . Hearing loss   . Glaucoma   . Complication of anesthesia   . PONV (postoperative nausea and vomiting)   . Renal disorder ~ 03/2011    "wasn't functioning right"  . Stroke   . Stroke     "just a little one"  . Hypothyroidism   . Chronic abdominal pain    . Paroxysmal atrial fibrillation     Past Surgical History  Procedure Laterality Date  . Esophagogastroduodenoscopy  12/22/2010    Procedure: ESOPHAGOGASTRODUODENOSCOPY (EGD);  Surgeon: Beryle Beams;  Location: [facility];  Service: Endoscopy;  Laterality: N/A;  . Appendectomy  1950  . Cataract extraction w/ intraocular lens  implant, bilateral Bilateral 1990's  . Coronary angioplasty with stent placement      "think I have 4 total"  . Inguinal hernia repair Bilateral 03/28/2012    Procedure: LAPAROSCOPIC BILATERAL INGUINAL HERNIA REPAIR;  Surgeon: Adin Hector, MD;  Location: [facility];  Service: General;  Laterality: Bilateral;  . Insertion of mesh Bilateral 03/28/2012    Procedure: INSERTION OF MESH;  Surgeon: Adin Hector, MD;  Location: [facility];  Service: General;  Laterality: Bilateral;  . Hernia repair    . Esophagogastroduodenoscopy N/A 04/30/2012    Procedure: ESOPHAGOGASTRODUODENOSCOPY (EGD);  Surgeon: Beryle Beams, MD;  Location: [facility];  Service: Endoscopy;  Laterality: N/A;  . Colonoscopy N/A 05/01/2012    Procedure: COLONOSCOPY;  Surgeon: Beryle Beams, MD;  Location: [place];  Service: Endoscopy;  Laterality: N/A;  . Cardiac catheterization      Family History  Problem Relation Age of Onset  . Diabetes Mother   . Heart disease Father   . Lung cancer Brother   . Stroke Neg Hx   . Hypertension Neg  Hx   . Hyperlipidemia Neg Hx   . Alcohol abuse Neg Hx    Social History:  reports that he quit smoking about 32 years ago. His smoking use included Cigarettes. He started smoking about 67 years ago. He has a 6 pack-year smoking history. He has never used smokeless tobacco. He reports that he does not drink alcohol or use illicit drugs.  Allergies: No Known Allergies   (Not in a hospital admission)  Results for orders placed during the hospital encounter of 07/15/13 (from the past 48 hour(s))  CBC WITH DIFFERENTIAL     Status: Abnormal   Collection Time     07/15/13 11:24 AM      Result Value Ref Range   WBC 6.8  4.0 - 10.5 K/uL   RBC 4.51  4.22 - 5.81 MIL/uL   Hemoglobin 12.7 (*) 13.0 - 17.0 g/dL   HCT 37.2 (*) 39.0 - 52.0 %   MCV 82.5  78.0 - 100.0 fL   MCH 28.2  26.0 - 34.0 pg   MCHC 34.1  30.0 - 36.0 g/dL   RDW 15.7 (*) 11.5 - 15.5 %   Platelets 130 (*) 150 - 400 K/uL   Neutrophils Relative % 58  43 - 77 %   Neutro Abs 4.0  1.7 - 7.7 K/uL   Lymphocytes Relative 29  12 - 46 %   Lymphs Abs 2.0  0.7 - 4.0 K/uL   Monocytes Relative 11  3 - 12 %   Monocytes Absolute 0.7  0.1 - 1.0 K/uL   Eosinophils Relative 2  0 - 5 %   Eosinophils Absolute 0.1  0.0 - 0.7 K/uL   Basophils Relative 0  0 - 1 %   Basophils Absolute 0.0  0.0 - 0.1 K/uL  BASIC METABOLIC PANEL     Status: Abnormal   Collection Time    07/15/13 11:24 AM      Result Value Ref Range   Sodium 142  137 - 147 mEq/L   Potassium 4.1  3.7 - 5.3 mEq/L   Chloride 104  96 - 112 mEq/L   CO2 24  19 - 32 mEq/L   Glucose, Bld 98  70 - 99 mg/dL   BUN 29 (*) 6 - 23 mg/dL   Creatinine, Ser 2.06 (*) 0.50 - 1.35 mg/dL   Calcium 9.4  8.4 - 10.5 mg/dL   GFR calc non Af Amer 29 (*) >90 mL/min   GFR calc Af Amer 33 (*) >90 mL/min   Comment: (NOTE)     The eGFR has been calculated using the CKD EPI equation.     This calculation has not been validated in all clinical situations.     eGFR's persistently <90 mL/min signify possible Chronic Kidney     Disease.  TROPONIN I     Status: None   Collection Time    07/15/13 11:24 AM      Result Value Ref Range   Troponin I <0.30  <0.30 ng/mL   Comment:            Due to the release kinetics of cTnI,     a negative result within the first hours     of the onset of symptoms does not rule out     myocardial infarction with certainty.     If myocardial infarction is still suspected,     repeat the test at appropriate intervals.  PRO B NATRIURETIC PEPTIDE     Status: None  Collection Time    07/15/13 11:24 AM      Result Value Ref Range    Pro B Natriuretic peptide (BNP) 390.0  0 - 450 pg/mL   Dg Chest Port 1 View  07/15/2013   CLINICAL DATA:  Chest pain  EXAM: PORTABLE CHEST - 1 VIEW  COMPARISON:  07/11/2013  FINDINGS: Mild cardiac enlargement without heart failure. Mild bibasilar atelectasis. Negative for pneumonia or effusion.  IMPRESSION: Mild bibasilar atelectasis.   Electronically Signed   By: Franchot Gallo M.D.   On: 07/15/2013 11:13    Review of Systems  Constitutional: Negative for fever and chills.  Eyes: Negative for double vision and photophobia.  Respiratory: Negative for cough, hemoptysis and sputum production.   Cardiovascular: Positive for chest pain and leg swelling. Negative for orthopnea and claudication.  Gastrointestinal: Negative for nausea, vomiting and abdominal pain.  Genitourinary: Negative for dysuria.  Neurological: Negative for dizziness and headaches.    Blood pressure 153/73, pulse 64, temperature 98.6 F (37 C), temperature source Oral, resp. rate 13, SpO2 95.00%. Physical Exam  Constitutional: He is oriented to person, place, and time.  HENT:  Head: Normocephalic and atraumatic.  Eyes: Conjunctivae are normal. Left eye exhibits no discharge. No scleral icterus.  Neck: Normal range of motion. Neck supple. No JVD present. No thyromegaly present.  Cardiovascular: Normal rate and regular rhythm.   Murmur (Soft systolic murmur noted no S3 gallop) heard. Respiratory:  Decreased breath sound at bases  GI: Soft. Bowel sounds are normal. He exhibits no distension. There is no tenderness. There is no rebound.  Musculoskeletal:  No clubbing cyanosis 1+ edema noted  Neurological: He is alert and oriented to person, place, and time.     Assessment/Plan Unstable angina rule out MI Coronary artery disease history of MI in the past and multivessel PCI as above Hypertension Diabetes mellitus Chronic kidney disease stage III Compensated systolic heart failure Ischemic cardiomyopathy History  of paroxysmal A. fib in the past Hypercholesteremia History of DVT in the past History of PE in the past GERD History of chronic abdominal pain History of pancreatitis Hypothyroidism Hypercholesteremia Plan  As per orders Check old records [facility] N 07/15/2013, 12:53 PM

## 2013-07-15 NOTE — Progress Notes (Addendum)
ANTICOAGULATION CONSULT NOTE - Initial Consult  Pharmacy Consult for Heparin when INR <2.0 Indication: chest pain/ACS and atrial fibrillation  No Known Allergies  Patient Measurements: Height: 5\' 8"  (172.7 cm) Weight: 189 lb (85.73 kg) IBW/kg (Calculated) : 68.4 Heparin Dosing Weight: 85.7 kg  Vital Signs: Temp: 97.8 F (36.6 C) (06/24 1430) Temp src: Oral (06/24 1430) BP: 180/78 mmHg (06/24 1430) Pulse Rate: 61 (06/24 1400)  Labs:  Recent Labs  07/15/13 1124 07/15/13 1510  HGB 12.7* 14.0  HCT 37.2* 41.2  PLT 130* 123*  LABPROT  --  24.1*  INR  --  2.16*  CREATININE 2.06* 1.86*  TROPONINI <0.30 <0.30    Estimated Creatinine Clearance: 33.2 ml/min (by C-G formula based on Cr of 1.86).   Medical History: Past Medical History  Diagnosis Date  . GERD (gastroesophageal reflux disease)   . Chronic back pain   . Gout     no problems in 2 years  . Pulmonary embolism, bilateral 2010  . Hyperlipemia   . Myocardial infarction 1987; 1989  . Anginal pain   . DVT, lower extremity 1980's    LLE  . Asthma     "touch"  . Pneumonia 1970's  . Arthritis     "mostly in my knees and shoulders"  . Collapsed lung     "for 14 years" right   . Diabetes mellitus   . Hypertension   . Depression   . Anxiety   . Hearing loss   . Glaucoma   . Complication of anesthesia   . PONV (postoperative nausea and vomiting)   . Renal disorder ~ 03/2011    "wasn't functioning right"  . Stroke   . Stroke     "just a little one"  . Hypothyroidism   . Chronic abdominal pain   . Paroxysmal atrial fibrillation    Assessment:   Admitted with recurrent chest pain.  Takes Coumadin 4 mg daily at home for afib. Last dose 6/23 at home.  INR 2.16. Low baseline platelet count.  Spoke briefly with Dr. Sharyn LullHarwani.  Heparin to begin without bolus when INR < 2.0.  Goal of Therapy:  Heparin level 0.3-0.7 units/ml Monitor platelets by anticoagulation protocol: Yes   Plan:   No heparin today.  PT/INR in am.  Heparin to begin without bolus in am if INR <2.0  Dennie Fettersgan, Theresa Donovan, [place] Pager: [phone] 07/15/2013,4:11 PM

## 2013-07-15 NOTE — ED Provider Notes (Signed)
CSN: [id number]     Arrival date & time 07/15/13  [id number] History   First MD Initiated Contact with Patient 07/15/13 0912     Chief Complaint  Patient presents with  . Chest Pain  . Pain      HPI Pt was seen at 0920. Per EMS and pt report, c/o gradual onset and worsening of multiple intermittent episodes of chest "pain" for the past 5 days. Describes the CP as "pressure" and "heaviness," located in his mid-sternal area. CP occurs at rest or on exertion. Pt states the CP was lasting for "10 minutes" but is now "lasting for an hour or 2." Pt did not take his own ntg. EMS gave ASA and SL ntg with improvement in his CP. Denies SOB/cough, no abd pain, no N/V/D, no back pain.    Past Medical History  Diagnosis Date  . GERD (gastroesophageal reflux disease)   . Chronic back pain   . Gout     no problems in 2 years  . Pulmonary embolism, bilateral 2010  . Hyperlipemia   . Myocardial infarction 1987; 1989  . Anginal pain   . DVT, lower extremity 1980's    LLE  . Asthma     "touch"  . Pneumonia 1970's  . Arthritis     "mostly in my knees and shoulders"  . Collapsed lung     "for 14 years" right   . Diabetes mellitus   . Hypertension   . Depression   . Anxiety   . Hearing loss   . Glaucoma   . Complication of anesthesia   . PONV (postoperative nausea and vomiting)   . Renal disorder ~ 03/2011    "wasn't functioning right"  . Stroke   . Stroke     "just a little one"  . Hypothyroidism   . Chronic abdominal pain   . Paroxysmal atrial fibrillation    Past Surgical History  Procedure Laterality Date  . Esophagogastroduodenoscopy  12/22/2010    Procedure: ESOPHAGOGASTRODUODENOSCOPY (EGD);  Surgeon: Theda BelfastPatrick D Hung;  Location: [facility];  Service: Endoscopy;  Laterality: N/A;  . Appendectomy  1950  . Cataract extraction w/ intraocular lens  implant, bilateral Bilateral 1990's  . Coronary angioplasty with stent placement      "think I have 4 total"  . Inguinal hernia repair  Bilateral 03/28/2012    Procedure: LAPAROSCOPIC BILATERAL INGUINAL HERNIA REPAIR;  Surgeon: Ardeth SportsmanSteven C. Gross, MD;  Location: [facility];  Service: General;  Laterality: Bilateral;  . Insertion of mesh Bilateral 03/28/2012    Procedure: INSERTION OF MESH;  Surgeon: Ardeth SportsmanSteven C. Gross, MD;  Location: [facility];  Service: General;  Laterality: Bilateral;  . Hernia repair    . Esophagogastroduodenoscopy N/A 04/30/2012    Procedure: ESOPHAGOGASTRODUODENOSCOPY (EGD);  Surgeon: Theda BelfastPatrick D Hung, MD;  [facility];  Service: Endoscopy;  Laterality: N/A;  . Colonoscopy N/A 05/01/2012    Procedure: COLONOSCOPY;  Surgeon: Theda BelfastPatrick D Hung, MD;  [facility];  Service: Endoscopy;  Laterality: N/A;  . Cardiac catheterization     Family History  Problem Relation Age of Onset  . Diabetes Mother   . Heart disease Father   . Lung cancer Brother   . Stroke Neg Hx   . Hypertension Neg Hx   . Hyperlipidemia Neg Hx   . Alcohol abuse Neg Hx    History  Substance Use Topics  . Smoking status: Former Smoker -- 2.00 packs/day for 3 years    Types: Cigarettes  Start date: 01/27/1946    Quit date: 01/22/1981  . Smokeless tobacco: Never Used  . Alcohol Use: No     Comment: 07/12/2011; "last drink of alcohol was ~ 1983"    Review of Systems ROS: Statement: All systems negative except as marked or noted in the HPI; Constitutional: Negative for fever and chills. ; ; Eyes: Negative for eye pain, redness and discharge. ; ; ENMT: Negative for ear pain, hoarseness, nasal congestion, sinus pressure and sore throat. ; ; Cardiovascular: +CP. Negative for palpitations, diaphoresis, dyspnea and peripheral edema. ; ; Respiratory: Negative for cough, wheezing and stridor. ; ; Gastrointestinal: Negative for nausea, vomiting, diarrhea, abdominal pain, blood in stool, hematemesis, jaundice and rectal bleeding. . ; ; Genitourinary: Negative for dysuria, flank pain and hematuria. ; ; Musculoskeletal: Negative for back pain and  neck pain. Negative for swelling and trauma.; ; Skin: Negative for pruritus, rash, abrasions, blisters, bruising and skin lesion.; ; Neuro: Negative for headache, lightheadedness and neck stiffness. Negative for weakness, altered level of consciousness , altered mental status, extremity weakness, paresthesias, involuntary movement, seizure and syncope.      Allergies  Review of patient's allergies indicates no known allergies.  Home Medications   Prior to Admission medications   Medication Sig Start Date End Date Taking? Authorizing Donata Reddick  acetaminophen (TYLENOL) 500 MG tablet Take 1,000 mg by mouth every 6 (six) hours as needed for pain.   Yes Historical Jayme Mednick, MD  amiodarone (PACERONE) 200 MG tablet Take 200 mg by mouth daily.   Yes Historical Cullan Launer, MD  atorvastatin (LIPITOR) 40 MG tablet Take 40 mg by mouth daily.   Yes Historical Amari Zagal, MD  cetirizine (ZYRTEC) 10 MG tablet Take 10 mg by mouth daily.   Yes Historical Oluwanifemi Petitti, MD  dicyclomine (BENTYL) 10 MG capsule Take 10 mg by mouth 3 (three) times daily before meals.   Yes Historical Afrika Brick, MD  dorzolamide-timolol (COSOPT) 22.3-6.8 MG/ML ophthalmic solution Place 1 drop into the right eye 3 (three) times daily.   Yes Historical Shalini Mair, MD  esomeprazole (NEXIUM) 40 MG capsule Take 40 mg by mouth daily at 12 noon.   Yes Historical Christyanna Mckeon, MD  ferrous sulfate 325 (65 FE) MG tablet Take 325 mg by mouth 3 (three) times daily with meals.   Yes Historical Shanieka Blea, MD  furosemide (LASIX) 40 MG tablet Take 40 mg by mouth daily.    Yes Historical Mally Gavina, MD  glimepiride (AMARYL) 1 MG tablet Take 1 mg by mouth daily before breakfast.   Yes Historical Montre Harbor, MD  hydroxypropyl methylcellulose (ISOPTO TEARS) 2.5 % ophthalmic solution Place 1 drop into both eyes 2 (two) times daily as needed (dry eyes).   Yes Historical Joy Haegele, MD  isosorbide mononitrate (IMDUR) 60 MG 24 hr tablet Take 60 mg by mouth daily.   Yes Historical  Ivannia Willhelm, MD  levothyroxine (SYNTHROID, LEVOTHROID) 50 MCG tablet Take 50 mcg by mouth daily before breakfast.    Yes Historical Orest Dygert, MD  losartan (COZAAR) 100 MG tablet Take 100 mg by mouth daily.   Yes Historical Elya Tarquinio, MD  nitroGLYCERIN (NITROSTAT) 0.4 MG SL tablet Place 0.4 mg under the tongue every 5 (five) minutes as needed for chest pain.   Yes Historical Malin Sambrano, MD  pilocarpine (PILOCAR) 4 % ophthalmic solution Place 1 drop into the right eye 4 (four) times daily.   Yes Historical Dorita Rowlands, MD  ranitidine (ZANTAC) 300 MG capsule Take 300 mg by mouth every evening.   Yes Historical Montasia Chisenhall, MD  spironolactone (ALDACTONE)  25 MG tablet Take 25 mg by mouth daily.   Yes Historical Noelly Lasseigne, MD  tamsulosin (FLOMAX) 0.4 MG CAPS capsule Take 0.4 mg by mouth daily after supper.   Yes Historical Crystol Walpole, MD  traMADol (ULTRAM) 50 MG tablet Take 50 mg by mouth every 6 (six) hours as needed (pain).   Yes Historical [name] Mannes, MD  warfarin (COUMADIN) 4 MG tablet Take 4 mg by mouth daily at 6 PM.    Yes Historical Kenzlee Fishburn, MD  albuterol (PROVENTIL HFA;VENTOLIN HFA) 108 (90 BASE) MCG/ACT inhaler Inhale 2 puffs into the lungs every 6 (six) hours as needed for shortness of breath.     Historical Niccolas Loeper, MD  carvedilol (COREG) 3.125 MG tablet Take 3.125 mg by mouth 2 (two) times daily with a meal.    Historical Warner Laduca, MD   BP 172/78  Pulse 67  Temp(Src) 98.6 F (37 C) (Oral)  Resp 16  SpO2 97% Physical Exam 0925: Physical examination:  Nursing notes reviewed; Vital signs and O2 SAT reviewed;  Constitutional: Well developed, Well nourished, Well hydrated, In no acute distress; Head:  Normocephalic, atraumatic; Eyes: EOMI, PERRL, No scleral icterus; ENMT: Mouth and pharynx normal, Mucous membranes moist; Neck: Supple, Full range of motion, No lymphadenopathy; Cardiovascular: Regular rate and rhythm, No gallop; Respiratory: Breath sounds clear & equal bilaterally, No wheezes.  Speaking full  sentences with ease, Normal respiratory effort/excursion; Chest: Nontender, Movement normal; Abdomen: Soft, Nontender, Nondistended, Normal bowel sounds; Genitourinary: No CVA tenderness; Extremities: Pulses normal, No tenderness, No edema, No calf edema or asymmetry.; Neuro: AA&Ox3, vague/rambling historian. Major CN grossly intact.  Speech clear. No gross focal motor or sensory deficits in extremities.; Skin: Color normal, Warm, Dry.   ED Course  Procedures     EKG Interpretation None      MDM  MDM Reviewed: nursing note, vitals and previous chart Reviewed previous: labs and ECG Interpretation: labs, ECG and x-ray     Date: 07/15/2013  Rate: 64  Rhythm: normal sinus rhythm  QRS Axis: normal  Intervals: normal  ST/T Wave abnormalities: normal  Conduction Disutrbances:left bundle branch block  Narrative Interpretation:   Old EKG Reviewed: unchanged; no significant changes from previous EKG dated 07/11/2013.   Results for orders placed during the hospital encounter of 07/15/13  CBC WITH DIFFERENTIAL      Result Value Ref Range   WBC 6.8  4.0 - 10.5 K/uL   RBC 4.51  4.22 - 5.81 MIL/uL   Hemoglobin 12.7 (*) 13.0 - 17.0 g/dL   HCT 11.937.2 (*) 14.739.0 - 82.952.0 %   MCV 82.5  78.0 - 100.0 fL   MCH 28.2  26.0 - 34.0 pg   MCHC 34.1  30.0 - 36.0 g/dL   RDW 56.215.7 (*) 13.011.5 - 86.515.5 %   Platelets 130 (*) 150 - 400 K/uL   Neutrophils Relative % 58  43 - 77 %   Neutro Abs 4.0  1.7 - 7.7 K/uL   Lymphocytes Relative 29  12 - 46 %   Lymphs Abs 2.0  0.7 - 4.0 K/uL   Monocytes Relative 11  3 - 12 %   Monocytes Absolute 0.7  0.1 - 1.0 K/uL   Eosinophils Relative 2  0 - 5 %   Eosinophils Absolute 0.1  0.0 - 0.7 K/uL   Basophils Relative 0  0 - 1 %   Basophils Absolute 0.0  0.0 - 0.1 K/uL  BASIC METABOLIC PANEL      Result Value Ref Range   Sodium  142  137 - 147 mEq/L   Potassium 4.1  3.7 - 5.3 mEq/L   Chloride 104  96 - 112 mEq/L   CO2 24  19 - 32 mEq/L   Glucose, Bld 98  70 - 99 mg/dL    BUN 29 (*) 6 - 23 mg/dL   Creatinine, Ser 1.61 (*) 0.50 - 1.35 mg/dL   Calcium 9.4  8.4 - 09.6 mg/dL   GFR calc non Af Amer 29 (*) >90 mL/min   GFR calc Af Amer 33 (*) >90 mL/min  TROPONIN I      Result Value Ref Range   Troponin I <0.30  <0.30 ng/mL  PRO B NATRIURETIC PEPTIDE      Result Value Ref Range   Pro B Natriuretic peptide (BNP) 390.0  0 - 450 pg/mL   Dg Chest Port 1 View 07/15/2013   CLINICAL DATA:  Chest pain  EXAM: PORTABLE CHEST - 1 VIEW  COMPARISON:  07/11/2013  FINDINGS: Mild cardiac enlargement without heart failure. Mild bibasilar atelectasis. Negative for pneumonia or effusion.  IMPRESSION: Mild bibasilar atelectasis.   Electronically Signed   By: Marlan Palau M.D.   On: 07/15/2013 11:13    1230:  Pt feels better after meds. BUN/Cr elevated from baseline, will continue judicious IVF. Dx and testing d/w pt and family.  Questions answered.  Verb understanding, agreeable to admit.  T/C to Dr. Sharyn Lull, case discussed, including:  HPI, pertinent PM/SHx, VS/PE, dx testing, ED course and treatment:  Agreeable to admit, requests he will come to the ED for evaluation.   Laray Anger, DO 07/18/13 2229

## 2013-07-16 ENCOUNTER — Observation Stay (HOSPITAL_COMMUNITY): Payer: Medicare Other

## 2013-07-16 ENCOUNTER — Other Ambulatory Visit: Payer: Self-pay

## 2013-07-16 LAB — PROTIME-INR
INR: 2.15 — ABNORMAL HIGH (ref 0.00–1.49)
Prothrombin Time: 24 seconds — ABNORMAL HIGH (ref 11.6–15.2)

## 2013-07-16 LAB — CBC
HCT: 39.8 % (ref 39.0–52.0)
Hemoglobin: 13.6 g/dL (ref 13.0–17.0)
MCH: 28.2 pg (ref 26.0–34.0)
MCHC: 34.2 g/dL (ref 30.0–36.0)
MCV: 82.4 fL (ref 78.0–100.0)
Platelets: 138 10*3/uL — ABNORMAL LOW (ref 150–400)
RBC: 4.83 MIL/uL (ref 4.22–5.81)
RDW: 15.8 % — AB (ref 11.5–15.5)
WBC: 6.3 10*3/uL (ref 4.0–10.5)

## 2013-07-16 LAB — LIPID PANEL
Cholesterol: 154 mg/dL (ref 0–200)
HDL: 69 mg/dL (ref >39)
LDL Cholesterol: 71 mg/dL (ref 0–99)
Total CHOL/HDL Ratio: 2.2 RATIO
Triglycerides: 68 mg/dL (ref <150)
VLDL: 14 mg/dL (ref 0–40)

## 2013-07-16 LAB — BASIC METABOLIC PANEL
BUN: 30 mg/dL — AB (ref 6–23)
CALCIUM: 9.2 mg/dL (ref 8.4–10.5)
CHLORIDE: 102 meq/L (ref 96–112)
CO2: 24 meq/L (ref 19–32)
Creatinine, Ser: 1.86 mg/dL — ABNORMAL HIGH (ref 0.50–1.35)
GFR calc Af Amer: 37 mL/min — ABNORMAL LOW (ref >90)
GFR calc non Af Amer: 32 mL/min — ABNORMAL LOW (ref >90)
Glucose, Bld: 113 mg/dL — ABNORMAL HIGH (ref 70–99)
Potassium: 3.9 mEq/L (ref 3.7–5.3)
Sodium: 140 mEq/L (ref 137–147)

## 2013-07-16 LAB — GLUCOSE, CAPILLARY
GLUCOSE-CAPILLARY: 123 mg/dL — AB (ref 70–99)
Glucose-Capillary: 96 mg/dL (ref 70–99)
Glucose-Capillary: 128 mg/dL — ABNORMAL HIGH (ref 70–99)
Glucose-Capillary: 106 mg/dL — ABNORMAL HIGH (ref 70–99)

## 2013-07-16 LAB — TROPONIN I: Troponin I: <0.3 ng/mL (ref <0.30)

## 2013-07-16 MED ORDER — REGADENOSON 0.4 MG/5ML IV SOLN
0.4000 mg | Freq: Once | INTRAVENOUS | Status: AC
  Administered 2013-07-16: 0.4 mg via INTRAVENOUS
  Filled 2013-07-16: qty 5

## 2013-07-16 MED ORDER — CARVEDILOL 6.25 MG PO TABS
6.2500 mg | ORAL_TABLET | Freq: Two times a day (BID) | ORAL | Status: DC
  Administered 2013-07-16 – 2013-07-17 (×2): 6.25 mg via ORAL
  Filled 2013-07-16 (×4): qty 1

## 2013-07-16 MED ORDER — REGADENOSON 0.4 MG/5ML IV SOLN
INTRAVENOUS | Status: AC
  Administered 2013-07-16: 09:00:00
  Filled 2013-07-16: qty 5

## 2013-07-16 MED ORDER — TECHNETIUM TC 99M SESTAMIBI GENERIC - CARDIOLITE
10.0000 | Freq: Once | INTRAVENOUS | Status: AC | PRN
  Administered 2013-07-16: 10 via INTRAVENOUS

## 2013-07-16 MED ORDER — TECHNETIUM TC 99M SESTAMIBI GENERIC - CARDIOLITE
30.0000 | Freq: Once | INTRAVENOUS | Status: AC | PRN
  Administered 2013-07-16: 30 via INTRAVENOUS

## 2013-07-16 MED ORDER — WARFARIN SODIUM 5 MG PO TABS
5.0000 mg | ORAL_TABLET | ORAL | Status: AC
  Administered 2013-07-16: 5 mg via ORAL
  Filled 2013-07-16: qty 1

## 2013-07-16 MED ORDER — WARFARIN - PHARMACIST DOSING INPATIENT: Freq: Every day | Status: DC

## 2013-07-16 MED ORDER — TAMSULOSIN HCL 0.4 MG PO CAPS
0.4000 mg | ORAL_CAPSULE | Freq: Every day | ORAL | Status: DC
  Administered 2013-07-16: 0.4 mg via ORAL
  Filled 2013-07-16 (×3): qty 1

## 2013-07-16 MED ORDER — NITROGLYCERIN 2 % TD OINT
1.0000 [in_us] | TOPICAL_OINTMENT | Freq: Four times a day (QID) | TRANSDERMAL | Status: DC
  Administered 2013-07-16 (×2): 1 [in_us] via TOPICAL
  Filled 2013-07-16: qty 30

## 2013-07-16 NOTE — Progress Notes (Signed)
Utilization review completed.  

## 2013-07-16 NOTE — Progress Notes (Signed)
ANTICOAGULATION CONSULT NOTE - Follow Up Consult  Pharmacy Consult for Coumadin Indication: Afib  No Known Allergies  Patient Measurements: Height: 5\' 8"  (172.7 cm) Weight: 172 lb 9.6 oz (78.291 kg) IBW/kg (Calculated) : 68.4 Heparin Dosing Weight: 78 kg  Vital Signs: Temp: 97.9 F (36.6 C) (06/25 1358) Temp src: Oral (06/25 1358) BP: 142/63 mmHg (06/25 1358) Pulse Rate: 59 (06/25 1358)  Labs:  Recent Labs  07/15/13 1124 07/15/13 1510 07/15/13 1951 07/16/13 0330  HGB 12.7* 14.0  --  13.6  HCT 37.2* 41.2  --  39.8  PLT 130* 123*  --  138*  LABPROT  --  24.1*  --  24.0*  INR  --  2.16*  --  2.15*  CREATININE 2.06* 1.86*  --  1.86*  TROPONINI <0.30 <0.30 <0.30 <0.30    Estimated Creatinine Clearance: 30.1 ml/min (by C-G formula based on Cr of 1.86).  Assessment: INR still 2.15.  Last Coumadin dose 6/23 at home. Home Coumadin regimen: 4 mg daily.  Now resuming Coumadin (originally on hold due to CP)  Goal of Therapy:  INR = 2 to 3 Monitor platelets by anticoagulation protocol: Yes   Plan:  Coumadin 5 mg po x 1 dose tonight (resume home dose in AM) Daily INR  Thank you. Okey RegalLisa Powell, PharmD [phone] 07/16/2013,7:04 PM

## 2013-07-16 NOTE — Progress Notes (Signed)
ANTICOAGULATION CONSULT NOTE - Follow Up Consult  Pharmacy Consult for Heparin when INR <2.0 Indication: chest pain/ACS and atrial fibrillation  No Known Allergies  Patient Measurements: Height: 5\' 8"  (172.7 cm) Weight: 172 lb 9.6 oz (78.291 kg) IBW/kg (Calculated) : 68.4 Heparin Dosing Weight: 78 kg  Vital Signs: Temp: 97.9 F (36.6 C) (06/25 1358) Temp src: Oral (06/25 1358) BP: 142/63 mmHg (06/25 1358) Pulse Rate: 59 (06/25 1358)  Labs:  Recent Labs  07/15/13 1124 07/15/13 1510 07/15/13 1951 07/16/13 0330  HGB 12.7* 14.0  --  13.6  HCT 37.2* 41.2  --  39.8  PLT 130* 123*  --  138*  LABPROT  --  24.1*  --  24.0*  INR  --  2.16*  --  2.15*  CREATININE 2.06* 1.86*  --  1.86*  TROPONINI <0.30 <0.30 <0.30 <0.30    Estimated Creatinine Clearance: 30.1 ml/min (by C-G formula based on Cr of 1.86).  Assessment:   INR still 2.15.  Last Coumadin dose 6/23 at home.   Home Coumadin regimen: 4 mg daily.  Goal of Therapy:  Heparin level 0.3-0.7 units/ml Monitor platelets by anticoagulation protocol: Yes   Plan:   No heparin needed today.  PT/INR in am.  Heparin without bolus when INR <2.0.  Dennie FettersEgan, Theresa Donovan, RPh Pager: [phone] 07/16/2013,3:39 PM

## 2013-07-16 NOTE — Progress Notes (Signed)
Subjective:  Patient complains of vague chest pain off and on without associated symptoms 3 sets of cardiac enzymes are negative EKG showed left bundle branch block.  Schedule for nuclear stress test today  Objective:  Vital Signs in the last 24 hours: Temp:  [97.7 F (36.5 C)-98 F (36.7 C)] 98 F (36.7 C) (06/25 0500) Pulse Rate:  [54-82] 74 (06/25 0959) Resp:  [12-18] 18 (06/25 0500) BP: (144-214)/(59-100) 184/82 mmHg (06/25 0959) SpO2:  [94 %-98 %] 94 % (06/25 0500) Weight:  [78.291 kg (172 lb 9.6 oz)-85.73 kg (189 lb)] 78.291 kg (172 lb 9.6 oz) (06/25 0500)  Intake/Output from previous day: 06/24 0701 - 06/25 0700 In: -  Out: 950 [Urine:950] Intake/Output from this shift: Total I/O In: -  Out: 200 [Urine:200]  Physical Exam: Neck: no adenopathy, no carotid bruit, no JVD and supple, symmetrical, trachea midline Lungs: decreased breath sound at bases Heart: regular rate and rhythm, S1, S2 normal and soft systolic murmur noted Abdomen: soft, non-tender; bowel sounds normal; no masses,  no organomegaly Extremities: no clubbing cyanosis 1+ edema noted  Lab Results:  Recent Labs  07/15/13 1510 07/16/13 0330  WBC 6.7 6.3  HGB 14.0 13.6  PLT 123* 138*    Recent Labs  07/15/13 1510 07/16/13 0330  NA 142 140  K 4.2 3.9  CL 105 102  CO2 23 24  GLUCOSE 86 113*  BUN 26* 30*  CREATININE 1.86* 1.86*    Recent Labs  07/15/13 1951 07/16/13 0330  TROPONINI <0.30 <0.30   Hepatic Function Panel  Recent Labs  07/15/13 1510  PROT 7.7  ALBUMIN 3.6  AST 27  ALT 25  ALKPHOS 108  BILITOT 0.5    Recent Labs  07/16/13 0330  CHOL 154   No results found for this basename: PROTIME,  in the last 72 hours  Imaging: Imaging results have been reviewed and Dg Chest Port 1 View  07/15/2013   CLINICAL DATA:  Chest pain  EXAM: PORTABLE CHEST - 1 VIEW  COMPARISON:  07/11/2013  FINDINGS: Mild cardiac enlargement without heart failure. Mild bibasilar atelectasis.  Negative for pneumonia or effusion.  IMPRESSION: Mild bibasilar atelectasis.   Electronically Signed   By: Marlan Palauharles  Clark M.D.   On: 07/15/2013 11:13    Cardiac Studies:  Assessment/Plan:  Stable angina and MI ruled out Coronary artery disease history of MI in the past and multivessel PCI as above  uncontrolledHypertension  Diabetes mellitus  Chronic kidney disease stage III  Compensated systolic heart failure  Ischemic cardiomyopathy  History of paroxysmal A. fib in the past  Hypercholesteremia  History of DVT in the past  History of PE in the past  GERD  History of chronic abdominal pain  History of pancreatitis  Hypothyroidism  Hypercholesteremia  plan Increase beta blockers as per orders Scheduled for nuclear stress test today. Hold Coumadin for now until the stress test result available  LOS: 1 day    HARWANI,MOHAN N 07/16/2013, 10:14 AM

## 2013-07-17 LAB — PROTIME-INR
INR: 1.86 — ABNORMAL HIGH (ref 0.00–1.49)
Prothrombin Time: 21.4 seconds — ABNORMAL HIGH (ref 11.6–15.2)

## 2013-07-17 LAB — GLUCOSE, CAPILLARY
Glucose-Capillary: 122 mg/dL — ABNORMAL HIGH (ref 70–99)
Glucose-Capillary: 129 mg/dL — ABNORMAL HIGH (ref 70–99)

## 2013-07-17 MED ORDER — ALUM & MAG HYDROXIDE-SIMETH 200-200-20 MG/5ML PO SUSP
15.0000 mL | ORAL | Status: DC | PRN
  Administered 2013-07-17: 15 mL via ORAL
  Filled 2013-07-17: qty 30

## 2013-07-17 MED ORDER — WARFARIN SODIUM 6 MG PO TABS
6.0000 mg | ORAL_TABLET | Freq: Once | ORAL | Status: DC
  Filled 2013-07-17: qty 1

## 2013-07-17 NOTE — Discharge Summary (Signed)
  Discharge summary dictated on 07/17/2013 dictation number is [phone]

## 2013-07-17 NOTE — Discharge Instructions (Signed)
Angina Pectoris °Angina pectoris, often just called angina, is extreme discomfort in your chest, neck, or arm caused by a lack of blood in the middle and thickest layer of your heart wall (myocardium). It may feel like tightness or heavy pressure. It may feel like a crushing or squeezing pain. Some people say it feels like gas or indigestion. It may go down your shoulders, back, and arms. Some people may have symptoms other than pain. These symptoms include fatigue, shortness of breath, cold sweats, or nausea. There are four different types of angina: °· Stable angina--Stable angina usually occurs in episodes of predictable frequency and duration. It usually is brought on by physical activity, emotional stress, or excitement. These are all times when the myocardium needs more oxygen. Stable angina usually lasts a few minutes and often is relieved by taking a medicine that can be taken under your tongue (sublingually). The medicine is called nitroglycerin. Stable angina is caused by a buildup of plaque inside the arteries, which restricts blood flow to the heart muscle (atherosclerosis). °· Unstable angina--Unstable angina can occur even when your body experiences little or no physical exertion. It can occur during sleep. It can also occur at rest. It can suddenly increase in severity or frequency. It might not be relieved by sublingual nitroglycerin. It can last up to 30 minutes. The most common cause of unstable angina is a blood clot that has developed on the top of plaque buildup inside a coronary artery. It can lead to a heart attack if the blood clot completely blocks the artery. °· Microvascular angina--This type of angina is caused by a disorder of tiny blood vessels called arterioles. Microvascular angina is more common in women. The pain may be more severe and last longer than other types of angina pectoris. °· Prinzmetal or variant angina--This type of angina pectoris usually occurs when your body  experiences little or no physical exertion. It especially occurs in the early morning hours. It is caused by a spasm of your coronary artery. °HOME CARE INSTRUCTIONS  °· Only take over-the-counter and prescription medicines as directed by your health care provider. °· Stay active or increase your exercise as directed by your health care provider. °· Limit strenuous activity as directed by your health care provider. °· Limit heavy lifting as directed by your health care provider. °· Maintain a healthy weight. °· Learn about and eat heart-healthy foods. °· Do not use any tobacco products including cigarettes, chewing tobacco or electronic cigarettes. °SEEK IMMEDIATE MEDICAL CARE IF:  °You experience the following symptoms: °· Chest, neck, deep shoulder, or arm pain or discomfort that lasts more than a few minutes. °· Chest, neck, deep shoulder, or arm pain or discomfort that goes away and comes back, repeatedly. °· Heavy sweating with discomfort, without a noticeable cause. °· Shortness of breath or difficulty breathing. °· Angina that does not get better after a few minutes of rest or after taking sublingual nitroglycerin. °These can all be symptoms of a heart attack, which is a medical emergency! Get medical help at once. Call your local emergency service (911 in [place] immediately. Do not  drive yourself to the hospital and do not  wait to for your symptoms to go away. °MAKE SURE YOU: °· Understand these instructions. °· Will watch your condition. °· Will get help right away if you are not doing well or get worse. °Document Released: 01/08/2005 Document Revised: 01/13/2013 Document Reviewed: 10/18/2011 °[employer]® Patient Information ©2015 [employer]. This information is not intended   to replace advice given to you by your health care provider. Make sure you discuss any questions you have with your health care provider. ° °

## 2013-07-17 NOTE — Progress Notes (Signed)
ANTICOAGULATION CONSULT NOTE - Follow Up Consult  Pharmacy Consult for Coumadin Indication: Afib  No Known Allergies  Patient Measurements: Height: 5\' 8"  (172.7 cm) Weight: 172 lb 9.6 oz (78.291 kg) IBW/kg (Calculated) : 68.4 Heparin Dosing Weight: 78 kg  Vital Signs: Temp: 98.2 F (36.8 C) (06/26 0525) Temp src: Oral (06/26 0525) BP: 107/59 mmHg (06/26 0525) Pulse Rate: 67 (06/26 0525)  Labs:  Recent Labs  07/15/13 1124 07/15/13 1510 07/15/13 1951 07/16/13 0330 07/17/13 0500  HGB 12.7* 14.0  --  13.6  --   HCT 37.2* 41.2  --  39.8  --   PLT 130* 123*  --  138*  --   LABPROT  --  24.1*  --  24.0* 21.4*  INR  --  2.16*  --  2.15* 1.86*  CREATININE 2.06* 1.86*  --  1.86*  --   TROPONINI <0.30 <0.30 <0.30 <0.30  --     Estimated Creatinine Clearance: 30.1 ml/min (by C-G formula based on Cr of 1.86).  Assessment: 80 yo male on coumadin for history of DVT/PE and also with afib. Coumadin was on hold and has now been resumed and INR today= 1.86 (INRs prior to today have been at goal; due to coumadin hold patient has missed one dose of coumadin).  Goal of Therapy:  INR = 2 to 3 Monitor platelets by anticoagulation protocol: Yes   Plan:  -Coumadin 6mg  po today -Daily PT/INR  Harland GermanAndrew Meyer, Pharm D 07/17/2013 10:09 AM

## 2013-07-18 NOTE — Discharge Summary (Signed)
NAMHaywood [name]:  [name], [name]                ACCOUNT NO.:  [id number]  MEDICAL RECORD NO.:  [id number]  LOCATION:  [id number]                        FACILITY:  [facility]  PHYSICIAN:  Eduardo OsierMohan N. Sharyn LullHarwani, M.D. DATE OF BIRTH:  Jul 10, 1929  DATE OF ADMISSION:  07/15/2013 DATE OF DISCHARGE:  07/17/2013                              DISCHARGE SUMMARY   ADMITTING DIAGNOSES: 1. Unstable angina rule out myocardial infarction. 2. Coronary artery disease history of myocardial infarction in the     past, status post multivessel percutaneous coronary intervention in     the past. 3. Hypertension. 4. Diabetes mellitus. 5. Chronic kidney disease, stage III. 6. Compensated systolic heart failure. 7. Ischemic cardiomyopathy. 8. History of paroxysmal atrial fibrillation in the past. 9. Hypercholesteremia. 10.History of deep venous thrombosis in the past. 11.History of bilateral pulmonary embolism in the past. 12.Gastroesophageal reflux disease. 13.History of chronic abdominal pain, workup negative in the past. 14.History of pancreatitis in the past. 15.Hypothyroidism. 16.Hypercholesteremia.  DISCHARGE DIAGNOSES: 1. Stable angina myocardial infarction ruled out.  Negative nuclear     stress test as above. 2. Coronary artery disease, history of myocardial infarction in the     past, status post multivessel percutaneous coronary intervention in     the past. 3. Hypertension. 4. Diabetes mellitus controlled by diet. 5. Chronic kidney disease, stage III. 6. Compensated systolic heart failure. 7. Ischemic cardiomyopathy. 8. History of paroxysmal atrial fibrillation in the past. 9. Hypercholesteremia. 10.History of deep venous thrombosis in the past. 11.History of bilateral pulmonary embolism in the past. 12.Gastroesophageal reflux disease. 13.History of chronic abdominal pain stable. 14.History of pancreatitis in the past. 15.Hypothyroidism. 16.Hypercholesteremia.  DISCHARGE HOME MEDICATIONS: 1. Tylenol 1000  mg every 6 hours as needed for pain. 2. Amiodarone 200 mg daily. 3. Atorvastatin 40 mg daily. 4. Carvedilol 3.125 mg twice daily. 5. Zyrtec 10 mg daily as needed. 6. Bentyl 10 mg 3 times daily before meals as before. 7. Cosopt eye drops as before. 8. Nexium 40 mg daily as before. 9. Ferrous sulfate 325 mg 3 times daily as before. 10.Lasix 40 mg daily. 11.Isopto Tears drops as before. 12.Imdur 60 mg daily as before. 13.Levothyroxine 50 mcg daily as before. 14.Losartan 100 mg daily. 15.Nitrostat 0.4 mg sublingual use as directed. 16.Pilocar eye drops as before. 17.Ranitidine 300 mg by mouth every evening. 18.Aldactone 25 mg daily. 19.Flomax 0.4 mg daily. 20.Warfarin 4 mg 1 tablet daily. 21.The patient has been advised to stop Amaryl and tramadol for now.     The patient has been advised to monitor blood pressure and blood     sugar daily.  FOLLOW UP:  With me in 1 week.  CONDITION AT DISCHARGE:  Stable.  BRIEF HISTORY AND HOSPITAL COURSE:  [name] is an 80 year old male with past medical history significant for multiple medical problems, i.e. coronary artery disease, history of MI x2 in the past, status post PCI to LAD and left circumflex and RCA in the past, ischemic cardiomyopathy, history of congestive heart failure secondary to systolic dysfunction, history of bilateral pulmonary embolism, history of paroxysmal AFib, hypercholesteremia, non-insulin-dependent diabetes mellitus, history of deep venous thrombosis in the past, remote tobacco abuse, chronic kidney disease stage III, hypothyroidism,  history of pancreatitis in the past, GERD, history of chronic abdominal pain, had extensive GI workup in the past.  He came to the ER complaining of recurrent retrosternal chest pain described as pressure, lightheadedness radiating to the left shoulder off and on for last few days.  Received sublingual nitro and aspirin by EMS with partial relief of chest pain. States chest pain  was grade 10/10.  Denies any palpitation, lightheadedness, or syncope.  Denies PND, orthopnea, or leg swelling. Complains of mild leg swelling.  Denies cough, fever, chills.  Denies any relation of chest pain to food, breathing, or movement.  PAST MEDICAL HISTORY:  As above.  PHYSICAL EXAMINATION:  GENERAL:  He was alert, awake, oriented x3. VITAL SIGNS:  Blood pressure was 153/73, pulse was 64.  He was afebrile. HEENT:  Conjunctivae was pink. NECK:  Supple.  No JVD.  No bruit. LUNGS:  Decreased breath sounds at bases. CARDIOVASCULAR:  S1, S2 was normal.  There was soft systolic murmur and S3 gallop. ABDOMEN:  Soft.  Bowel sounds were present.  Nontender. EXTREMITIES:  There was no clubbing, cyanosis.  There was 1+ edema noted.  LABORATORY DATA:  Sodium was 142, potassium 4.1, BUN 29, creatinine 2.06.  Repeat BUN was 26, creatinine 1.86.  His 3 sets of troponin-I were normal.  Hemoglobin was 12.7, hematocrit 37.2, white count of 6.8. ProBNP was 390.  PT was 24.1, INR 2.24, repeat PT was 24.1, INR 2.16. TSH was 4.54.  Hemoglobin A1c was 6.2, blood sugar ranged between 104- 121.  EKG showed normal sinus rhythm with left bundle-branch block pattern.  There were no new acute ischemic changes noted.  The patient had Lexiscan Myoview stress test, which showed fixed defect within the anteroseptal and inferolateral segments.  No reversible ischemia. Global hypokinesia, EF of 41%.  BRIEF HOSPITAL COURSE:  The patient was admitted to telemetry unit.  MI was ruled out by serial enzymes and EKG.  The patient subsequently underwent Lexiscan Myoview as above.  The patient did not had any further episodes of anginal chest pain during the hospital stay.  The patient is ambulating in room without any problems.  The patient will be discharged home on above medications and will be followed up in my office in 1 week.     Eduardo OsierMohan N. Sharyn LullHarwani, M.D.     [facility]  D:  07/17/2013  T:  07/18/2013   Job:  [id number]

## 2013-08-19 ENCOUNTER — Emergency Department (HOSPITAL_COMMUNITY): Payer: Medicare Other

## 2013-08-19 ENCOUNTER — Encounter (HOSPITAL_COMMUNITY): Payer: Self-pay | Admitting: Emergency Medicine

## 2013-08-19 ENCOUNTER — Emergency Department (HOSPITAL_COMMUNITY)
Admission: EM | Admit: 2013-08-19 | Discharge: 2013-08-19 | Disposition: A | Discharge status: Home / Self Care | Payer: Medicare Other | Attending: Emergency Medicine | Admitting: Emergency Medicine

## 2013-08-19 DIAGNOSIS — Z86711 Personal history of pulmonary embolism: Secondary | ICD-10-CM | POA: Diagnosis not present

## 2013-08-19 DIAGNOSIS — G8929 Other chronic pain: Secondary | ICD-10-CM | POA: Diagnosis not present

## 2013-08-19 DIAGNOSIS — K219 Gastro-esophageal reflux disease without esophagitis: Secondary | ICD-10-CM | POA: Diagnosis not present

## 2013-08-19 DIAGNOSIS — I10 Essential (primary) hypertension: Secondary | ICD-10-CM | POA: Insufficient documentation

## 2013-08-19 DIAGNOSIS — R0789 Other chest pain: Secondary | ICD-10-CM | POA: Insufficient documentation

## 2013-08-19 DIAGNOSIS — Z86718 Personal history of other venous thrombosis and embolism: Secondary | ICD-10-CM | POA: Diagnosis not present

## 2013-08-19 DIAGNOSIS — Z79899 Other long term (current) drug therapy: Secondary | ICD-10-CM | POA: Diagnosis not present

## 2013-08-19 DIAGNOSIS — Z8709 Personal history of other diseases of the respiratory system: Secondary | ICD-10-CM | POA: Insufficient documentation

## 2013-08-19 DIAGNOSIS — I4891 Unspecified atrial fibrillation: Secondary | ICD-10-CM | POA: Diagnosis not present

## 2013-08-19 DIAGNOSIS — E785 Hyperlipidemia, unspecified: Secondary | ICD-10-CM | POA: Diagnosis not present

## 2013-08-19 DIAGNOSIS — H409 Unspecified glaucoma: Secondary | ICD-10-CM | POA: Insufficient documentation

## 2013-08-19 DIAGNOSIS — E039 Hypothyroidism, unspecified: Secondary | ICD-10-CM | POA: Insufficient documentation

## 2013-08-19 DIAGNOSIS — R079 Chest pain, unspecified: Secondary | ICD-10-CM | POA: Insufficient documentation

## 2013-08-19 DIAGNOSIS — J45909 Unspecified asthma, uncomplicated: Secondary | ICD-10-CM | POA: Diagnosis not present

## 2013-08-19 DIAGNOSIS — Z7901 Long term (current) use of anticoagulants: Secondary | ICD-10-CM | POA: Diagnosis not present

## 2013-08-19 DIAGNOSIS — I252 Old myocardial infarction: Secondary | ICD-10-CM | POA: Insufficient documentation

## 2013-08-19 DIAGNOSIS — Z8673 Personal history of transient ischemic attack (TIA), and cerebral infarction without residual deficits: Secondary | ICD-10-CM | POA: Diagnosis not present

## 2013-08-19 DIAGNOSIS — Z8739 Personal history of other diseases of the musculoskeletal system and connective tissue: Secondary | ICD-10-CM | POA: Insufficient documentation

## 2013-08-19 DIAGNOSIS — E119 Type 2 diabetes mellitus without complications: Secondary | ICD-10-CM | POA: Diagnosis not present

## 2013-08-19 DIAGNOSIS — I209 Angina pectoris, unspecified: Secondary | ICD-10-CM | POA: Insufficient documentation

## 2013-08-19 DIAGNOSIS — Z9861 Coronary angioplasty status: Secondary | ICD-10-CM | POA: Diagnosis not present

## 2013-08-19 DIAGNOSIS — N289 Disorder of kidney and ureter, unspecified: Secondary | ICD-10-CM | POA: Insufficient documentation

## 2013-08-19 DIAGNOSIS — Z8701 Personal history of pneumonia (recurrent): Secondary | ICD-10-CM | POA: Insufficient documentation

## 2013-08-19 DIAGNOSIS — Z8659 Personal history of other mental and behavioral disorders: Secondary | ICD-10-CM | POA: Insufficient documentation

## 2013-08-19 DIAGNOSIS — Z87891 Personal history of nicotine dependence: Secondary | ICD-10-CM | POA: Insufficient documentation

## 2013-08-19 LAB — CBC
HEMATOCRIT: 37.2 % — AB (ref 39.0–52.0)
Hemoglobin: 12.7 g/dL — ABNORMAL LOW (ref 13.0–17.0)
MCH: 28.7 pg (ref 26.0–34.0)
MCHC: 34.1 g/dL (ref 30.0–36.0)
MCV: 84 fL (ref 78.0–100.0)
Platelets: 130 10*3/uL — ABNORMAL LOW (ref 150–400)
RBC: 4.43 MIL/uL (ref 4.22–5.81)
RDW: 16 % — ABNORMAL HIGH (ref 11.5–15.5)
WBC: 5.9 10*3/uL (ref 4.0–10.5)

## 2013-08-19 LAB — URINALYSIS, ROUTINE W REFLEX MICROSCOPIC
Bilirubin Urine: NEGATIVE
Glucose, UA: NEGATIVE mg/dL
Hgb urine dipstick: NEGATIVE
KETONES UR: NEGATIVE mg/dL
Leukocytes, UA: NEGATIVE
NITRITE: NEGATIVE
PH: 5.5 (ref 5.0–8.0)
Protein, ur: NEGATIVE mg/dL
Specific Gravity, Urine: 1.014 (ref 1.005–1.030)
UROBILINOGEN UA: 0.2 mg/dL (ref 0.0–1.0)

## 2013-08-19 LAB — LIPASE, BLOOD: LIPASE: 46 U/L (ref 11–59)

## 2013-08-19 LAB — COMPREHENSIVE METABOLIC PANEL
ALT: 21 U/L (ref 0–53)
ANION GAP: 12 (ref 5–15)
AST: 21 U/L (ref 0–37)
Albumin: 3.4 g/dL — ABNORMAL LOW (ref 3.5–5.2)
Alkaline Phosphatase: 89 U/L (ref 39–117)
BILIRUBIN TOTAL: 0.4 mg/dL (ref 0.3–1.2)
BUN: 20 mg/dL (ref 6–23)
CHLORIDE: 105 meq/L (ref 96–112)
CO2: 25 meq/L (ref 19–32)
CREATININE: 1.89 mg/dL — AB (ref 0.50–1.35)
Calcium: 9 mg/dL (ref 8.4–10.5)
GFR calc Af Amer: 37 mL/min — ABNORMAL LOW (ref >90)
GFR, EST NON AFRICAN AMERICAN: 32 mL/min — AB (ref >90)
GLUCOSE: 80 mg/dL (ref 70–99)
Potassium: 3.8 mEq/L (ref 3.7–5.3)
Sodium: 142 mEq/L (ref 137–147)
Total Protein: 6.8 g/dL (ref 6.0–8.3)

## 2013-08-19 LAB — I-STAT TROPONIN, ED: Troponin i, poc: 0.01 ng/mL (ref 0.00–0.08)

## 2013-08-19 MED ORDER — MORPHINE SULFATE 4 MG/ML IJ SOLN
4.0000 mg | Freq: Once | INTRAMUSCULAR | Status: AC
  Administered 2013-08-19: 4 mg via INTRAVENOUS
  Filled 2013-08-19: qty 1

## 2013-08-19 MED ORDER — HYDROCODONE-ACETAMINOPHEN 5-325 MG PO TABS: 2.0000 | ORAL_TABLET | ORAL | Status: DC | PRN

## 2013-08-19 MED ORDER — ONDANSETRON HCL 4 MG/2ML IJ SOLN
4.0000 mg | Freq: Once | INTRAMUSCULAR | Status: AC
  Administered 2013-08-19: 4 mg via INTRAVENOUS
  Filled 2013-08-19: qty 2

## 2013-08-19 NOTE — ED Provider Notes (Signed)
CSN: [id number]     Arrival date & time 08/19/13  1725 History   First MD Initiated Contact with Patient 08/19/13 1747     Chief Complaint  Patient presents with  . Chest Pain     (Consider location/radiation/quality/duration/timing/severity/associated sxs/prior Treatment) Patient is a 80 y.o. male presenting with chest pain.  Chest Pain Pain location:  Substernal area Pain quality: sharp   Pain radiates to the back: no   Pain severity:  Severe Duration:  2 months Associated symptoms: no abdominal pain, no back pain, no cough, no headache and no shortness of breath     Past Medical History  Diagnosis Date  . GERD (gastroesophageal reflux disease)   . Chronic back pain   . Gout     no problems in 2 years  . Pulmonary embolism, bilateral 2010  . Hyperlipemia   . Myocardial infarction 1987; 1989  . Anginal pain   . DVT, lower extremity 1980's    LLE  . Asthma     "touch"  . Pneumonia 1970's  . Arthritis     "mostly in my knees and shoulders"  . Collapsed lung     "for 14 years" right   . Diabetes mellitus   . Hypertension   . Depression   . Anxiety   . Hearing loss   . Glaucoma   . Complication of anesthesia   . PONV (postoperative nausea and vomiting)   . Renal disorder ~ 03/2011    "wasn't functioning right"  . Stroke   . Stroke     "just a little one"  . Hypothyroidism   . Chronic abdominal pain   . Paroxysmal atrial fibrillation    Past Surgical History  Procedure Laterality Date  . Esophagogastroduodenoscopy  12/22/2010    Procedure: ESOPHAGOGASTRODUODENOSCOPY (EGD);  Surgeon: Theda Belfast;  Location: [facility] ENDOSCOPY;  Service: Endoscopy;  Laterality: N/A;  . Appendectomy  1950  . Cataract extraction w/ intraocular lens  implant, bilateral Bilateral 1990's  . Coronary angioplasty with stent placement      "think I have 4 total"  . Inguinal hernia repair Bilateral 03/28/2012    Procedure: LAPAROSCOPIC BILATERAL INGUINAL HERNIA REPAIR;  Surgeon: Ardeth Sportsman, MD;  Location: [facility] ORS;  Service: General;  Laterality: Bilateral;  . Insertion of mesh Bilateral 03/28/2012    Procedure: INSERTION OF MESH;  Surgeon: Ardeth Sportsman, MD;  Location: [facility] ORS;  Service: General;  Laterality: Bilateral;  . Hernia repair    . Esophagogastroduodenoscopy N/A 04/30/2012    Procedure: ESOPHAGOGASTRODUODENOSCOPY (EGD);  Surgeon: Theda Belfast, MD;  Location: [facility] ENDOSCOPY;  Service: Endoscopy;  Laterality: N/A;  . Colonoscopy N/A 05/01/2012    Procedure: COLONOSCOPY;  Surgeon: Theda Belfast, MD;  Location: [facility] ENDOSCOPY;  Service: Endoscopy;  Laterality: N/A;  . Cardiac catheterization     Family History  Problem Relation Age of Onset  . Diabetes Mother   . Heart disease Father   . Lung cancer Brother   . Stroke Neg Hx   . Hypertension Neg Hx   . Hyperlipidemia Neg Hx   . Alcohol abuse Neg Hx    History  Substance Use Topics  . Smoking status: Former Smoker -- 2.00 packs/day for 3 years    Types: Cigarettes    Start date: 01/27/1946    Quit date: 01/22/1981  . Smokeless tobacco: Never Used  . Alcohol Use: No     Comment: 07/12/2011; "last drink of alcohol was ~ 1983"  Review of Systems  Constitutional: Negative for activity change.  HENT: Negative for congestion.   Eyes: Negative for visual disturbance.  Respiratory: Negative for cough and shortness of breath.   Cardiovascular: Positive for chest pain. Negative for leg swelling.  Gastrointestinal: Negative for abdominal pain and blood in stool.  Genitourinary: Negative for dysuria and hematuria.  Musculoskeletal: Negative for back pain.  Skin: Negative for color change.  Neurological: Negative for syncope and headaches.  Psychiatric/Behavioral: Negative for agitation.      Allergies  Review of patient's allergies indicates no known allergies.  Home Medications   Prior to Admission medications   Medication Sig Start Date End Date Taking? Authorizing Provider  acetaminophen (TYLENOL) 500  MG tablet Take 1,000 mg by mouth every 6 (six) hours as needed for pain.    Historical Provider, MD  amiodarone (PACERONE) 200 MG tablet Take 200 mg by mouth daily.    Historical Provider, MD  atorvastatin (LIPITOR) 40 MG tablet Take 40 mg by mouth daily.    Historical Provider, MD  carvedilol (COREG) 3.125 MG tablet Take 3.125 mg by mouth 2 (two) times daily with a meal.    Historical Provider, MD  cetirizine (ZYRTEC) 10 MG tablet Take 10 mg by mouth daily.    Historical Provider, MD  dicyclomine (BENTYL) 10 MG capsule Take 10 mg by mouth 3 (three) times daily before meals.    Historical Provider, MD  dorzolamide-timolol (COSOPT) 22.3-6.8 MG/ML ophthalmic solution Place 1 drop into the right eye 3 (three) times daily.    Historical Provider, MD  esomeprazole (NEXIUM) 40 MG capsule Take 40 mg by mouth daily at 12 noon.    Historical Provider, MD  ferrous sulfate 325 (65 FE) MG tablet Take 325 mg by mouth 3 (three) times daily with meals.    Historical Provider, MD  furosemide (LASIX) 40 MG tablet Take 40 mg by mouth daily.     Historical Provider, MD  hydroxypropyl methylcellulose (ISOPTO TEARS) 2.5 % ophthalmic solution Place 1 drop into both eyes 2 (two) times daily as needed (dry eyes).    Historical Provider, MD  isosorbide mononitrate (IMDUR) 60 MG 24 hr tablet Take 60 mg by mouth daily.    Historical Provider, MD  levothyroxine (SYNTHROID, LEVOTHROID) 50 MCG tablet Take 50 mcg by mouth daily before breakfast.     Historical Provider, MD  losartan (COZAAR) 100 MG tablet Take 100 mg by mouth daily.    Historical Provider, MD  nitroGLYCERIN (NITROSTAT) 0.4 MG SL tablet Place 0.4 mg under the tongue every 5 (five) minutes as needed for chest pain.    Historical Provider, MD  pilocarpine (PILOCAR) 4 % ophthalmic solution Place 1 drop into the right eye 4 (four) times daily.    Historical Provider, MD  ranitidine (ZANTAC) 300 MG capsule Take 300 mg by mouth every evening.    Historical Provider, MD   spironolactone (ALDACTONE) 25 MG tablet Take 25 mg by mouth daily.    Historical Provider, MD  tamsulosin (FLOMAX) 0.4 MG CAPS capsule Take 0.4 mg by mouth daily after supper.    Historical Provider, MD  warfarin (COUMADIN) 4 MG tablet Take 4 mg by mouth daily at 6 PM.     Historical Provider, MD   BP 130/70  Pulse 54  Resp 17  Ht 5\' 8"  (1.727 m)  Wt 160 lb (72.576 kg)  BMI 24.33 kg/m2  SpO2 99% Physical Exam  Nursing note and vitals reviewed. Constitutional: He is oriented to person, place,  and time. He appears well-developed and well-nourished.  HENT:  Head: Normocephalic.  Eyes: Pupils are equal, round, and reactive to light.  Neck: Neck supple.  Cardiovascular: Normal rate and regular rhythm.  Exam reveals no gallop and no friction rub.   No murmur heard. Pain to palpation of the chest   Pulmonary/Chest: Effort normal. No respiratory distress.  Abdominal: Soft. He exhibits no distension. There is no tenderness.  Musculoskeletal: He exhibits no edema.  Neurological: He is alert and oriented to person, place, and time.  Skin: Skin is warm.  Psychiatric: He has a normal mood and affect.    ED Course  Procedures (including critical care time) Labs Review Labs Reviewed  CBC - Abnormal; Notable for the following:    Hemoglobin 12.7 (*)    HCT 37.2 (*)    RDW 16.0 (*)    Platelets 130 (*)    All other components within normal limits  COMPREHENSIVE METABOLIC PANEL - Abnormal; Notable for the following:    Creatinine, Ser 1.89 (*)    Albumin 3.4 (*)    GFR calc non Af Amer 32 (*)    GFR calc Af Amer 37 (*)    All other components within normal limits  LIPASE, BLOOD  URINALYSIS, ROUTINE W REFLEX MICROSCOPIC  I-STAT TROPOININ, ED    Imaging Review Dg Chest 2 View  08/19/2013   CLINICAL DATA:  Chest pain  EXAM: CHEST  2 VIEW  COMPARISON:  07/15/2013  FINDINGS: Cardiac shadow is mildly enlarged. The lungs are well aerated bilaterally. The focal infiltrate or sizable  effusion is seen. Calcification is noted in the medial aspect of the left lung apex stable from the previous exam. Previous CT shows this to be related to the posterior aspect of the left fourth rib. No other focal abnormality is seen.  IMPRESSION: No acute abnormality noted.   Electronically Signed   By: Alcide CleverMark  Lukens M.D.   On: 08/19/2013 19:27   Ct Abdomen Pelvis W Contrast  08/20/2013   CLINICAL DATA:  Right lower quadrant pain. History of inguinal repair March 2015. Prior appendectomy.  EXAM: CT ABDOMEN AND PELVIS WITH CONTRAST  TECHNIQUE: Multidetector CT imaging of the abdomen and pelvis was performed using the standard protocol following bolus administration of intravenous contrast.  CONTRAST:  70mL OMNIPAQUE IOHEXOL 300 MG/ML  SOLN  COMPARISON:  Several prior exams most recent 09/21/2012.  FINDINGS: Scarring lung bases stable.  Cardiomegaly.  Coronary artery calcifications.  Atherosclerotic type changes abdominal aorta with mild ectasia without aneurysmal dilation. Mild narrowing celiac aortic branch vessels. Atherosclerotic type changes iliac arteries without aneurysmal dilation.  Colonic diverticula most notable sigmoid colon. Small bowel traverses towards upper aspect of right inguinal hernia. Fat containing inguinal hernia below this region and on the left. No extra luminal bowel inflammatory process, free fluid or free air. Small hiatal hernia.  Bilateral renal cyst measuring up to 6 cm. No hydronephrosis or worrisome renal mass identified.  Left lobe liver 8 mm cyst. No worrisome hepatic, splenic, pancreatic or adrenal lesion. Slight hyperplasia of the adrenal glands.  Contracted gallbladder without calcified gallstone. Common bile duct top-normal in size for patient's age.  Diverticulum superior margin of the urinary bladder. Significant impression upon the bladder base by enlarged lobulated prostate gland. Clinical and laboratory correlation recommended help evaluate for possibility of prostate  malignancy.  Degenerative changes lumbar spine most prominent L2-3 through L5-S1. Scoliosis. No osseous destructive lesion.  No adenopathy.  IMPRESSION: Colonic diverticula most notable sigmoid colon.  Small bowel traverses towards upper aspect of right inguinal hernia. Fat containing inguinal hernia below this region and on the left. No extra luminal bowel inflammatory process, free fluid or free air.  Small hiatal hernia.  Bilateral renal cysts measuring up to 6 cm.  Diverticulum superior margin of the urinary bladder. Significant impression upon the bladder base by enlarged lobulated prostate gland. Clinical and laboratory correlation recommended help evaluate for possibility of prostate malignancy.   Electronically Signed   By: Bridgett Larsson M.D.   On: 08/20/2013 13:00     EKG Interpretation None      MDM   Final diagnoses:  Other chest pain   80 y/o male with past medical history of MI presents with 2 months of chest past that is sharp, constant, non-exertional. Pain is unlikely cardiac in nature and likely chronic secondary to the duration and description of the pain. Trop negative, EKG- LBBB unchanged from previous, screening labs non-contributory.  The patient's cardiologist Dr. Sharyn Lull was contacted and the patient was discussed with him. Per his notes the patient has undergone a stress test 1 month prior that was with in normal limits. The patient was recently seen in the office and he states that he will see the patient in the next few days. This was passed on to the patient and the patient was then discharged home with a few norco for pain control and return precautions were given.     Clement Sayres, MD 08/20/13 1607  Clement Sayres, MD 08/20/13 [phone]

## 2013-08-19 NOTE — ED Notes (Signed)
Patient transported to X-ray 

## 2013-08-19 NOTE — ED Notes (Signed)
Per EMS pt stated having chest pain 9/10, dizziness and SOB on arrival, 324 mg aspirin and 3 nitro were given enroute. Last nitro was at 1715. Pt has hx of GERD, MI with 3 stents. EMS noted LBBB on EKG en route. EMS vitals 145/76 BP, 64 HR, 16 RR, 96% O2. Blood pressure prior to nitro 179/90. Pt sts his pain is 9/10 but is better than pain prior to receiving nitro.

## 2013-08-19 NOTE — Discharge Instructions (Signed)

## 2013-08-19 NOTE — ED Notes (Signed)
Pt. returned from XR. 

## 2013-08-19 NOTE — ED Notes (Signed)
Pt is in a gown and on the monitor. 

## 2013-08-20 ENCOUNTER — Encounter (HOSPITAL_COMMUNITY): Payer: Self-pay | Admitting: Emergency Medicine

## 2013-08-20 ENCOUNTER — Emergency Department (HOSPITAL_COMMUNITY)
Admission: EM | Admit: 2013-08-20 | Discharge: 2013-08-20 | Disposition: A | Discharge status: Home / Self Care | Payer: Medicare Other | Attending: Emergency Medicine | Admitting: Emergency Medicine

## 2013-08-20 ENCOUNTER — Emergency Department (HOSPITAL_COMMUNITY): Payer: Medicare Other

## 2013-08-20 DIAGNOSIS — Z791 Long term (current) use of non-steroidal anti-inflammatories (NSAID): Secondary | ICD-10-CM | POA: Diagnosis not present

## 2013-08-20 DIAGNOSIS — I252 Old myocardial infarction: Secondary | ICD-10-CM | POA: Insufficient documentation

## 2013-08-20 DIAGNOSIS — K219 Gastro-esophageal reflux disease without esophagitis: Secondary | ICD-10-CM | POA: Insufficient documentation

## 2013-08-20 DIAGNOSIS — Z7901 Long term (current) use of anticoagulants: Secondary | ICD-10-CM | POA: Diagnosis not present

## 2013-08-20 DIAGNOSIS — J45909 Unspecified asthma, uncomplicated: Secondary | ICD-10-CM | POA: Diagnosis not present

## 2013-08-20 DIAGNOSIS — Z87448 Personal history of other diseases of urinary system: Secondary | ICD-10-CM | POA: Diagnosis not present

## 2013-08-20 DIAGNOSIS — R1032 Left lower quadrant pain: Secondary | ICD-10-CM | POA: Insufficient documentation

## 2013-08-20 DIAGNOSIS — E039 Hypothyroidism, unspecified: Secondary | ICD-10-CM | POA: Diagnosis not present

## 2013-08-20 DIAGNOSIS — Z79899 Other long term (current) drug therapy: Secondary | ICD-10-CM | POA: Insufficient documentation

## 2013-08-20 DIAGNOSIS — E119 Type 2 diabetes mellitus without complications: Secondary | ICD-10-CM | POA: Diagnosis not present

## 2013-08-20 DIAGNOSIS — Z86718 Personal history of other venous thrombosis and embolism: Secondary | ICD-10-CM | POA: Insufficient documentation

## 2013-08-20 DIAGNOSIS — G8929 Other chronic pain: Secondary | ICD-10-CM | POA: Insufficient documentation

## 2013-08-20 DIAGNOSIS — F3289 Other specified depressive episodes: Secondary | ICD-10-CM | POA: Diagnosis not present

## 2013-08-20 DIAGNOSIS — I10 Essential (primary) hypertension: Secondary | ICD-10-CM | POA: Diagnosis not present

## 2013-08-20 DIAGNOSIS — Z8701 Personal history of pneumonia (recurrent): Secondary | ICD-10-CM | POA: Insufficient documentation

## 2013-08-20 DIAGNOSIS — I4891 Unspecified atrial fibrillation: Secondary | ICD-10-CM | POA: Diagnosis not present

## 2013-08-20 DIAGNOSIS — F329 Major depressive disorder, single episode, unspecified: Secondary | ICD-10-CM | POA: Diagnosis not present

## 2013-08-20 DIAGNOSIS — F411 Generalized anxiety disorder: Secondary | ICD-10-CM | POA: Insufficient documentation

## 2013-08-20 DIAGNOSIS — Z8673 Personal history of transient ischemic attack (TIA), and cerebral infarction without residual deficits: Secondary | ICD-10-CM | POA: Diagnosis not present

## 2013-08-20 DIAGNOSIS — Z87891 Personal history of nicotine dependence: Secondary | ICD-10-CM | POA: Diagnosis not present

## 2013-08-20 DIAGNOSIS — M129 Arthropathy, unspecified: Secondary | ICD-10-CM | POA: Insufficient documentation

## 2013-08-20 DIAGNOSIS — K5900 Constipation, unspecified: Secondary | ICD-10-CM | POA: Insufficient documentation

## 2013-08-20 DIAGNOSIS — R1031 Right lower quadrant pain: Secondary | ICD-10-CM

## 2013-08-20 DIAGNOSIS — Z86711 Personal history of pulmonary embolism: Secondary | ICD-10-CM | POA: Insufficient documentation

## 2013-08-20 DIAGNOSIS — H409 Unspecified glaucoma: Secondary | ICD-10-CM | POA: Diagnosis not present

## 2013-08-20 DIAGNOSIS — R112 Nausea with vomiting, unspecified: Secondary | ICD-10-CM | POA: Diagnosis not present

## 2013-08-20 LAB — COMPREHENSIVE METABOLIC PANEL
ALT: 22 U/L (ref 0–53)
AST: 23 U/L (ref 0–37)
Albumin: 3.7 g/dL (ref 3.5–5.2)
Alkaline Phosphatase: 97 U/L (ref 39–117)
Anion gap: 13 (ref 5–15)
BILIRUBIN TOTAL: 0.5 mg/dL (ref 0.3–1.2)
BUN: 21 mg/dL (ref 6–23)
CO2: 24 meq/L (ref 19–32)
CREATININE: 1.57 mg/dL — AB (ref 0.50–1.35)
Calcium: 9.2 mg/dL (ref 8.4–10.5)
Chloride: 108 mEq/L (ref 96–112)
GFR calc Af Amer: 46 mL/min — ABNORMAL LOW (ref >90)
GFR, EST NON AFRICAN AMERICAN: 40 mL/min — AB (ref >90)
Glucose, Bld: 109 mg/dL — ABNORMAL HIGH (ref 70–99)
Potassium: 4.2 mEq/L (ref 3.7–5.3)
Sodium: 145 mEq/L (ref 137–147)
Total Protein: 7.6 g/dL (ref 6.0–8.3)

## 2013-08-20 LAB — CBC WITH DIFFERENTIAL/PLATELET
BASOS ABS: 0 10*3/uL (ref 0.0–0.1)
Basophils Relative: 0 % (ref 0–1)
EOS PCT: 1 % (ref 0–5)
Eosinophils Absolute: 0.1 10*3/uL (ref 0.0–0.7)
HCT: 40.5 % (ref 39.0–52.0)
Hemoglobin: 13.9 g/dL (ref 13.0–17.0)
LYMPHS PCT: 28 % (ref 12–46)
Lymphs Abs: 1.9 10*3/uL (ref 0.7–4.0)
MCH: 29 pg (ref 26.0–34.0)
MCHC: 34.3 g/dL (ref 30.0–36.0)
MCV: 84.6 fL (ref 78.0–100.0)
MONO ABS: 0.7 10*3/uL (ref 0.1–1.0)
Monocytes Relative: 10 % (ref 3–12)
Neutro Abs: 4.1 10*3/uL (ref 1.7–7.7)
Neutrophils Relative %: 61 % (ref 43–77)
Platelets: 141 10*3/uL — ABNORMAL LOW (ref 150–400)
RBC: 4.79 MIL/uL (ref 4.22–5.81)
RDW: 16 % — AB (ref 11.5–15.5)
WBC: 6.7 10*3/uL (ref 4.0–10.5)

## 2013-08-20 LAB — PROTIME-INR
INR: 3.46 — ABNORMAL HIGH (ref 0.00–1.49)
Prothrombin Time: 34.8 seconds — ABNORMAL HIGH (ref 11.6–15.2)

## 2013-08-20 LAB — SAMPLE TO BLOOD BANK

## 2013-08-20 LAB — LIPASE, BLOOD: Lipase: 33 U/L (ref 11–59)

## 2013-08-20 LAB — POC OCCULT BLOOD, ED: Fecal Occult Bld: NEGATIVE

## 2013-08-20 LAB — TROPONIN I

## 2013-08-20 LAB — LACTIC ACID, PLASMA: Lactic Acid, Venous: 1.4 mmol/L (ref 0.5–2.2)

## 2013-08-20 MED ORDER — SODIUM CHLORIDE 0.9 % IV SOLN
1000.0000 mL | Freq: Once | INTRAVENOUS | Status: AC
  Administered 2013-08-20: 1000 mL via INTRAVENOUS

## 2013-08-20 MED ORDER — PANTOPRAZOLE SODIUM 40 MG IV SOLR
40.0000 mg | Freq: Once | INTRAVENOUS | Status: AC
  Administered 2013-08-20: 40 mg via INTRAVENOUS
  Filled 2013-08-20: qty 40

## 2013-08-20 MED ORDER — IOHEXOL 300 MG/ML  SOLN
70.0000 mL | Freq: Once | INTRAMUSCULAR | Status: AC | PRN
  Administered 2013-08-20: 70 mL via INTRAVENOUS

## 2013-08-20 MED ORDER — PROMETHAZINE HCL 25 MG PO TABS: 25.0000 mg | ORAL_TABLET | Freq: Four times a day (QID) | ORAL | Status: DC | PRN

## 2013-08-20 MED ORDER — IOHEXOL 300 MG/ML  SOLN
25.0000 mL | INTRAMUSCULAR | Status: AC
  Administered 2013-08-20: 25 mL via ORAL

## 2013-08-20 MED ORDER — ONDANSETRON HCL 4 MG/2ML IJ SOLN
4.0000 mg | Freq: Once | INTRAMUSCULAR | Status: AC
  Administered 2013-08-20: 4 mg via INTRAVENOUS
  Filled 2013-08-20: qty 2

## 2013-08-20 NOTE — Discharge Instructions (Signed)
Return to the emergency room with worsening of symptoms or with symptoms that are concerning. Follow up with Dr. Elnoria HowardHung in 2 days Take Vicodin for Pain PRN and phenergan for nausea. Do not operate machinery or drive while taking narcotics.

## 2013-08-20 NOTE — ED Provider Notes (Signed)
CSN: [id number]     Arrival date & time 08/20/13  0912 History   First MD Initiated Contact with Patient 08/20/13 0932     Chief Complaint  Patient presents with  . Abdominal Pain     Patient is a 80 y.o. male presenting with abdominal pain. The history is provided by the patient.  Abdominal Pain Pain location:  Epigastric and LLQ Pain quality: burning and cramping   Pain radiates to:  Epigastric region Pain severity:  Severe (10/10) Duration:  1 day (Chronic abdominal pain, 10/10 every day. Today the pain is the same. He came in today because he vomited this morning. He was told to come to the ED whenever he vomits.) Timing:  Constant Progression:  Unchanged Chronicity:  Chronic Context: eating and retching   Worsened by:  Eating Associated symptoms: constipation, nausea and vomiting   Associated symptoms: no chest pain, no chills, no diarrhea, no fever, no hematemesis, no hematochezia, no hematuria and no shortness of breath   Vomiting:    Emesis appearance: nonbilious without blood or black coffe grounds.   Number of occurrences:  Once this morning Risk factors comment:  Anticoagulant use Patient denies alcohol use. Followed by Dr. Elnoria Howard. Colonoscopy with bleeding in proximal colon and repair in April 2014.   Past Medical History  Diagnosis Date  . GERD (gastroesophageal reflux disease)   . Chronic back pain   . Gout     no problems in 2 years  . Pulmonary embolism, bilateral 2010  . Hyperlipemia   . Myocardial infarction 1987; 1989  . Anginal pain   . DVT, lower extremity 1980's    LLE  . Asthma     "touch"  . Pneumonia 1970's  . Arthritis     "mostly in my knees and shoulders"  . Collapsed lung     "for 14 years" right   . Diabetes mellitus   . Hypertension   . Depression   . Anxiety   . Hearing loss   . Glaucoma   . Complication of anesthesia   . PONV (postoperative nausea and vomiting)   . Renal disorder ~ 03/2011    "wasn't functioning right"  . Stroke    . Stroke     "just a little one"  . Hypothyroidism   . Chronic abdominal pain   . Paroxysmal atrial fibrillation    Past Surgical History  Procedure Laterality Date  . Esophagogastroduodenoscopy  12/22/2010    Procedure: ESOPHAGOGASTRODUODENOSCOPY (EGD);  Surgeon: Theda Belfast;  Location: [facility];  Service: Endoscopy;  Laterality: N/A;  . Appendectomy  1950  . Cataract extraction w/ intraocular lens  implant, bilateral Bilateral 1990's  . Coronary angioplasty with stent placement      "think I have 4 total"  . Inguinal hernia repair Bilateral 03/28/2012    Procedure: LAPAROSCOPIC BILATERAL INGUINAL HERNIA REPAIR;  Surgeon: Ardeth Sportsman, MD;  Location: [facility];  Service: General;  Laterality: Bilateral;  . Insertion of mesh Bilateral 03/28/2012    Procedure: INSERTION OF MESH;  Surgeon: Ardeth Sportsman, MD;  Location: [facility];  Service: General;  Laterality: Bilateral;  . Hernia repair    . Esophagogastroduodenoscopy N/A 04/30/2012    Procedure: ESOPHAGOGASTRODUODENOSCOPY (EGD);  Surgeon: Theda Belfast, MD;  Location: [facility] ENDOSCOPY;  Service: Endoscopy;  Laterality: N/A;  . Colonoscopy N/A 05/01/2012    Procedure: COLONOSCOPY;  Surgeon: Theda Belfast, MD;  Location: [facility] ENDOSCOPY;  Service: Endoscopy;  Laterality: N/A;  . Cardiac catheterization  Family History  Problem Relation Age of Onset  . Diabetes Mother   . Heart disease Father   . Lung cancer Brother   . Stroke Neg Hx   . Hypertension Neg Hx   . Hyperlipidemia Neg Hx   . Alcohol abuse Neg Hx    History  Substance Use Topics  . Smoking status: Former Smoker -- 2.00 packs/day for 3 years    Types: Cigarettes    Start date: 01/27/1946    Quit date: 01/22/1981  . Smokeless tobacco: Never Used  . Alcohol Use: No     Comment: 07/12/2011; "last drink of alcohol was ~ 1983"    Review of Systems  Constitutional: Negative for fever, chills and diaphoresis.  HENT: Negative for congestion and rhinorrhea.    Respiratory: Negative for choking, shortness of breath, wheezing and stridor.   Cardiovascular: Negative for chest pain and palpitations.  Gastrointestinal: Positive for nausea, vomiting, abdominal pain and constipation. Negative for diarrhea, blood in stool, hematochezia, abdominal distention and hematemesis.  Genitourinary: Negative for hematuria.  Musculoskeletal: Negative for back pain.  Skin: Negative for color change, pallor, rash and wound.  Neurological: Negative for weakness, numbness and headaches.  Psychiatric/Behavioral: Negative for behavioral problems and agitation. The patient is not nervous/anxious.       Allergies  Review of patient's allergies indicates no known allergies.  Home Medications   Prior to Admission medications   Medication Sig Start Date End Date Taking? Authorizing Provider  acetaminophen (TYLENOL) 500 MG tablet Take 1,000 mg by mouth every 6 (six) hours as needed (pain).    Yes Historical Provider, MD  amiodarone (PACERONE) 200 MG tablet Take 200 mg by mouth daily at 12 noon.    Yes Historical Provider, MD  amLODipine (NORVASC) 5 MG tablet  05/25/13  Yes Historical Provider, MD  atorvastatin (LIPITOR) 40 MG tablet Take 40 mg by mouth daily.   Yes Historical Provider, MD  carvedilol (COREG) 3.125 MG tablet Take 3.125 mg by mouth 2 (two) times daily with a meal.   Yes Historical Provider, MD  cetirizine (ZYRTEC) 10 MG tablet Take 10 mg by mouth daily.   Yes Historical Provider, MD  dicyclomine (BENTYL) 10 MG capsule Take 10 mg by mouth 3 (three) times daily before meals.   Yes Historical Provider, MD  dorzolamide-timolol (COSOPT) 22.3-6.8 MG/ML ophthalmic solution Place 1 drop into the right eye 3 (three) times daily.   Yes Historical Provider, MD  esomeprazole (NEXIUM) 40 MG capsule Take 40 mg by mouth daily.    Yes Historical Provider, MD  ferrous sulfate 325 (65 FE) MG tablet Take 325 mg by mouth 3 (three) times daily with meals.   Yes Historical  Provider, MD  fluticasone (FLONASE) 50 MCG/ACT nasal spray Place 1 spray into both nostrils 2 (two) times daily.  08/13/13  Yes Historical Provider, MD  furosemide (LASIX) 40 MG tablet Take 20 mg by mouth daily at 12 noon.    Yes Historical Provider, MD  glimepiride (AMARYL) 1 MG tablet Take 1 mg by mouth daily at 12 noon.  07/31/13  Yes Historical Provider, MD  HYDROcodone-acetaminophen (NORCO/VICODIN) 5-325 MG per tablet Take 2 tablets by mouth every 4 (four) hours as needed for moderate pain or severe pain. 08/19/13  Yes Clement Sayres, MD  isosorbide mononitrate (IMDUR) 60 MG 24 hr tablet Take 60 mg by mouth daily.   Yes Historical Provider, MD  levothyroxine (SYNTHROID, LEVOTHROID) 50 MCG tablet Take 50 mcg by mouth daily before breakfast.  Yes Historical Provider, MD  losartan (COZAAR) 100 MG tablet Take 100 mg by mouth daily.   Yes Historical Provider, MD  pilocarpine (PILOCAR) 4 % ophthalmic solution Place 1 drop into the right eye 4 (four) times daily.   Yes Historical Provider, MD  ranitidine (ZANTAC) 300 MG capsule Take 300 mg by mouth daily.    Yes Historical Provider, MD  spironolactone (ALDACTONE) 25 MG tablet Take 25 mg by mouth daily.   Yes Historical Provider, MD  tamsulosin (FLOMAX) 0.4 MG CAPS capsule Take 0.4 mg by mouth at bedtime.    Yes Historical Provider, MD  traMADol Janean Sark(ULTRAM) 50 MG tablet  07/11/13  Yes Historical Provider, MD  warfarin (COUMADIN) 4 MG tablet Take 4 mg by mouth daily at 6 PM.    Yes Historical Provider, MD  nitroGLYCERIN (NITROSTAT) 0.4 MG SL tablet Place 0.4 mg under the tongue every 5 (five) minutes as needed for chest pain.    Historical Provider, MD  promethazine (PHENERGAN) 25 MG tablet Take 1 tablet (25 mg total) by mouth every 6 (six) hours as needed for nausea or vomiting. 08/20/13   Louann SjogrenVictoria L Jerah Esty, PA-C   BP 160/57  Pulse 53  Temp(Src) 97.9 F (36.6 C) (Oral)  Resp 18  Ht 5\' 8"  (1.727 m)  Wt 183 lb (83.008 kg)  BMI 27.83 kg/m2  SpO2  98% Physical Exam  Constitutional: He is oriented to person, place, and time. He appears well-developed and well-nourished. No distress.  HENT:  Head: Normocephalic and atraumatic.  Eyes: EOM are normal.  Neck: Normal range of motion. Neck supple.  Cardiovascular: Normal rate, regular rhythm and intact distal pulses.   Pulmonary/Chest: Effort normal and breath sounds normal. He has no wheezes. He has no rales.  Abdominal: Bowel sounds are normal. He exhibits no mass. There is tenderness in the right lower quadrant and epigastric area. There is no rigidity, no rebound and no CVA tenderness.  Voluntary guarding.   Musculoskeletal: He exhibits no edema and no tenderness.  Neurological: He is alert and oriented to person, place, and time.  Skin: Skin is warm and dry. He is not diaphoretic.  Psychiatric: He has a normal mood and affect. His behavior is normal.    Repeat Abdominal exam: mild right quadrant tender to palpation. improving. +BS. No guarding, rigidity or rebound no CVA tenderness  ED Course  Procedures (including critical care time) Labs Review Labs Reviewed  CBC WITH DIFFERENTIAL - Abnormal; Notable for the following:    RDW 16.0 (*)    Platelets 141 (*)    All other components within normal limits  COMPREHENSIVE METABOLIC PANEL - Abnormal; Notable for the following:    Glucose, Bld 109 (*)    Creatinine, Ser 1.57 (*)    GFR calc non Af Amer 40 (*)    GFR calc Af Amer 46 (*)    All other components within normal limits  PROTIME-INR - Abnormal; Notable for the following:    Prothrombin Time 34.8 (*)    INR 3.46 (*)    All other components within normal limits  LIPASE, BLOOD  LACTIC ACID, PLASMA  TROPONIN I  POC OCCULT BLOOD, ED  SAMPLE TO BLOOD BANK    Imaging Review Dg Chest 2 View  08/19/2013   CLINICAL DATA:  Chest pain  EXAM: CHEST  2 VIEW  COMPARISON:  07/15/2013  FINDINGS: Cardiac shadow is mildly enlarged. The lungs are well aerated bilaterally. The focal  infiltrate or sizable effusion is seen. Calcification  is noted in the medial aspect of the left lung apex stable from the previous exam. Previous CT shows this to be related to the posterior aspect of the left fourth rib. No other focal abnormality is seen.  IMPRESSION: No acute abnormality noted.   Electronically Signed   By: Alcide Clever M.D.   On: 08/19/2013 19:27   Ct Abdomen Pelvis W Contrast  08/20/2013   CLINICAL DATA:  Right lower quadrant pain. History of inguinal repair March 2015. Prior appendectomy.  EXAM: CT ABDOMEN AND PELVIS WITH CONTRAST  TECHNIQUE: Multidetector CT imaging of the abdomen and pelvis was performed using the standard protocol following bolus administration of intravenous contrast.  CONTRAST:  70mL OMNIPAQUE IOHEXOL 300 MG/ML  SOLN  COMPARISON:  Several prior exams most recent 09/21/2012.  FINDINGS: Scarring lung bases stable.  Cardiomegaly.  Coronary artery calcifications.  Atherosclerotic type changes abdominal aorta with mild ectasia without aneurysmal dilation. Mild narrowing celiac aortic branch vessels. Atherosclerotic type changes iliac arteries without aneurysmal dilation.  Colonic diverticula most notable sigmoid colon. Small bowel traverses towards upper aspect of right inguinal hernia. Fat containing inguinal hernia below this region and on the left. No extra luminal bowel inflammatory process, free fluid or free air. Small hiatal hernia.  Bilateral renal cyst measuring up to 6 cm. No hydronephrosis or worrisome renal mass identified.  Left lobe liver 8 mm cyst. No worrisome hepatic, splenic, pancreatic or adrenal lesion. Slight hyperplasia of the adrenal glands.  Contracted gallbladder without calcified gallstone. Common bile duct top-normal in size for patient's age.  Diverticulum superior margin of the urinary bladder. Significant impression upon the bladder base by enlarged lobulated prostate gland. Clinical and laboratory correlation recommended help evaluate for  possibility of prostate malignancy.  Degenerative changes lumbar spine most prominent L2-3 through L5-S1. Scoliosis. No osseous destructive lesion.  No adenopathy.  IMPRESSION: Colonic diverticula most notable sigmoid colon. Small bowel traverses towards upper aspect of right inguinal hernia. Fat containing inguinal hernia below this region and on the left. No extra luminal bowel inflammatory process, free fluid or free air.  Small hiatal hernia.  Bilateral renal cysts measuring up to 6 cm.  Diverticulum superior margin of the urinary bladder. Significant impression upon the bladder base by enlarged lobulated prostate gland. Clinical and laboratory correlation recommended help evaluate for possibility of prostate malignancy.   Electronically Signed   By: Bridgett Larsson M.D.   On: 08/20/2013 13:00     EKG Interpretation   Date/Time:  Thursday August 20 2013 10:21:27 EDT Ventricular Rate:  54 PR Interval:  149 QRS Duration: 164 QT Interval:  503 QTC Calculation: 477 R Axis:   64 Text Interpretation:  Sinus rhythm Left bundle branch block No significant  change since last tracing Confirmed by DOCHERTY  MD, MEGAN ([id number]) on  08/20/2013 10:25:41 AM      MDM   Final diagnoses:  Right lower quadrant abdominal pain   Pt with PMH of chronic severe abdominal pain and 3 MIs presents with 10/10 abdominal pain and vomiting. On exam patient is severely tender in RLQ but better on repeat exam.  Normal WBC, lipase and lactic acid.  HGB 13.9 and negative occult blood. EKG, CXR and CT without any acute changes. Doubt internal bleeding or emergent abdominal condition. Suspect acid reflux. Patient was able to tolerate fluids without regurgitation. TX: phenergan and Vicodin. Patient advised to not operate machinery or drive while taking narcotics.  Discussed with patient that the pain he has is chronic and  unlikely to fully resolve.  Follow up with Dr. Elnoria Howard, gastroenterologist.  Discussed return precautions with  patient. Discussed all results and patient verbalizes understanding and agrees with plan.  This is a shared visit. Discussed patient with physician who saw and evaluated the patient.   Louann Sjogren, PA-C 08/21/13 [phone]

## 2013-08-20 NOTE — ED Notes (Signed)
Victoria, PA at the bedside.  

## 2013-08-20 NOTE — ED Notes (Signed)
Pt returned from CT °

## 2013-08-20 NOTE — ED Notes (Signed)
Pt finished drinking PO contrast. CT made aware.  

## 2013-08-20 NOTE — ED Notes (Signed)
Pt c/o of nausea  

## 2013-08-20 NOTE — ED Notes (Addendum)
Per [facility], pt from home after being seen yesterday for cp, presents with lower abd pain that goes up his chest and is a burning pain. States that his acid reflux has increased since yesterday. Constipated for 2 days. States he had a BM yesterday but stays constipated all the time. Hx of hernia. Received a Rx for vicodin but did not get it filled.

## 2013-08-20 NOTE — ED Notes (Signed)
Given gingerale for a PO fluid challenge

## 2013-08-21 NOTE — ED Provider Notes (Signed)
Medical screening examination/treatment/procedure(s) were conducted as a shared visit with non-physician practitioner(s) and myself.  I personally evaluated the patient during the encounter. Pt presents w/ report of 10/10 abodominal pain that he has had for 1 year with multiple prior w/u's including colonscopy & EGD. He states he was told that if he ever vomiting to come to the ED. He vomited twice in past 24 hrs, non-bloody, non-bilious.  On PE, VSS, pt in NAD. +ttp of abdomen, but soft, non-distended, no rebound or guarding. CT ab/pelvis w/o acute findings. He can f/u with PCP or GI as outpt.     EKG Interpretation   Date/Time:  Thursday August 20 2013 10:21:27 EDT Ventricular Rate:  54 PR Interval:  149 QRS Duration: 164 QT Interval:  503 QTC Calculation: 477 R Axis:   64 Text Interpretation:  Sinus rhythm Left bundle branch block No significant  change since last tracing Confirmed by DOCHERTY  MD, MEGAN ([id number]) on  08/20/2013 10:25:41 AM        Shanna CiscoMegan E Docherty, MD 08/21/13 [phone]

## 2013-08-21 NOTE — ED Provider Notes (Signed)
This patient was seen in conjunction with the resident physician, Dr. Virgel BouquetShepard.  The documentation accurately reflects this patient's evaluation in the ED.  On my exam, this patient presents with right such as pain, sharp, constant.  After discussion with his cardiologist, and with consideration of recent reassuring imaging, patient was discharged in stable condition, per his cardiologist's suggestion to follow up in the office. EKG had a sinus rhythm, rate 54, left bundle branch block, unchanged, abnormal   Gerhard Munchobert Jesusmanuel Erbes, MD 08/21/13 [phone]

## 2013-12-31 ENCOUNTER — Encounter (HOSPITAL_COMMUNITY): Payer: Self-pay | Admitting: Cardiology

## 2014-01-08 ENCOUNTER — Inpatient Hospital Stay (HOSPITAL_COMMUNITY): Payer: Medicare Other

## 2014-01-08 ENCOUNTER — Observation Stay (HOSPITAL_COMMUNITY)
Admission: AD | Admit: 2014-01-08 | Discharge: 2014-01-11 | Disposition: A | Discharge status: Home / Self Care | Payer: Medicare Other | Source: Ambulatory Visit | Attending: Cardiovascular Disease | Admitting: Cardiovascular Disease

## 2014-01-08 ENCOUNTER — Encounter (HOSPITAL_COMMUNITY): Payer: Self-pay | Admitting: *Deleted

## 2014-01-08 DIAGNOSIS — R109 Unspecified abdominal pain: Secondary | ICD-10-CM | POA: Insufficient documentation

## 2014-01-08 DIAGNOSIS — K219 Gastro-esophageal reflux disease without esophagitis: Secondary | ICD-10-CM | POA: Insufficient documentation

## 2014-01-08 DIAGNOSIS — N183 Chronic kidney disease, stage 3 unspecified: Secondary | ICD-10-CM | POA: Diagnosis present

## 2014-01-08 DIAGNOSIS — M1710 Unilateral primary osteoarthritis, unspecified knee: Secondary | ICD-10-CM | POA: Diagnosis present

## 2014-01-08 DIAGNOSIS — F329 Major depressive disorder, single episode, unspecified: Secondary | ICD-10-CM | POA: Insufficient documentation

## 2014-01-08 DIAGNOSIS — M1990 Unspecified osteoarthritis, unspecified site: Secondary | ICD-10-CM | POA: Diagnosis not present

## 2014-01-08 DIAGNOSIS — E039 Hypothyroidism, unspecified: Secondary | ICD-10-CM | POA: Diagnosis not present

## 2014-01-08 DIAGNOSIS — Z9861 Coronary angioplasty status: Secondary | ICD-10-CM | POA: Diagnosis not present

## 2014-01-08 DIAGNOSIS — I2510 Atherosclerotic heart disease of native coronary artery without angina pectoris: Secondary | ICD-10-CM | POA: Insufficient documentation

## 2014-01-08 DIAGNOSIS — M109 Gout, unspecified: Secondary | ICD-10-CM | POA: Insufficient documentation

## 2014-01-08 DIAGNOSIS — J9819 Other pulmonary collapse: Secondary | ICD-10-CM | POA: Diagnosis not present

## 2014-01-08 DIAGNOSIS — M179 Osteoarthritis of knee, unspecified: Secondary | ICD-10-CM | POA: Diagnosis present

## 2014-01-08 DIAGNOSIS — E785 Hyperlipidemia, unspecified: Secondary | ICD-10-CM | POA: Insufficient documentation

## 2014-01-08 DIAGNOSIS — F419 Anxiety disorder, unspecified: Secondary | ICD-10-CM | POA: Insufficient documentation

## 2014-01-08 DIAGNOSIS — Z86711 Personal history of pulmonary embolism: Secondary | ICD-10-CM | POA: Insufficient documentation

## 2014-01-08 DIAGNOSIS — N289 Disorder of kidney and ureter, unspecified: Secondary | ICD-10-CM | POA: Insufficient documentation

## 2014-01-08 DIAGNOSIS — G8929 Other chronic pain: Secondary | ICD-10-CM | POA: Insufficient documentation

## 2014-01-08 DIAGNOSIS — E119 Type 2 diabetes mellitus without complications: Secondary | ICD-10-CM | POA: Diagnosis not present

## 2014-01-08 DIAGNOSIS — Z8673 Personal history of transient ischemic attack (TIA), and cerebral infarction without residual deficits: Secondary | ICD-10-CM | POA: Insufficient documentation

## 2014-01-08 DIAGNOSIS — R079 Chest pain, unspecified: Secondary | ICD-10-CM | POA: Diagnosis not present

## 2014-01-08 DIAGNOSIS — I480 Paroxysmal atrial fibrillation: Secondary | ICD-10-CM | POA: Diagnosis not present

## 2014-01-08 DIAGNOSIS — Z86718 Personal history of other venous thrombosis and embolism: Secondary | ICD-10-CM | POA: Insufficient documentation

## 2014-01-08 DIAGNOSIS — H409 Unspecified glaucoma: Secondary | ICD-10-CM | POA: Insufficient documentation

## 2014-01-08 DIAGNOSIS — Z7901 Long term (current) use of anticoagulants: Secondary | ICD-10-CM | POA: Insufficient documentation

## 2014-01-08 DIAGNOSIS — I129 Hypertensive chronic kidney disease with stage 1 through stage 4 chronic kidney disease, or unspecified chronic kidney disease: Secondary | ICD-10-CM | POA: Insufficient documentation

## 2014-01-08 DIAGNOSIS — K222 Esophageal obstruction: Secondary | ICD-10-CM

## 2014-01-08 DIAGNOSIS — J45909 Unspecified asthma, uncomplicated: Secondary | ICD-10-CM | POA: Diagnosis not present

## 2014-01-08 DIAGNOSIS — Z7951 Long term (current) use of inhaled steroids: Secondary | ICD-10-CM | POA: Insufficient documentation

## 2014-01-08 DIAGNOSIS — I252 Old myocardial infarction: Secondary | ICD-10-CM | POA: Insufficient documentation

## 2014-01-08 DIAGNOSIS — Z8701 Personal history of pneumonia (recurrent): Secondary | ICD-10-CM | POA: Insufficient documentation

## 2014-01-08 DIAGNOSIS — Z79899 Other long term (current) drug therapy: Secondary | ICD-10-CM | POA: Insufficient documentation

## 2014-01-08 DIAGNOSIS — I10 Essential (primary) hypertension: Secondary | ICD-10-CM | POA: Diagnosis present

## 2014-01-08 LAB — COMPREHENSIVE METABOLIC PANEL
ALBUMIN: 3.2 g/dL — AB (ref 3.5–5.2)
ALT: 16 U/L (ref 0–53)
ANION GAP: 10 (ref 5–15)
AST: 20 U/L (ref 0–37)
Alkaline Phosphatase: 81 U/L (ref 39–117)
BUN: 17 mg/dL (ref 6–23)
CO2: 25 mEq/L (ref 19–32)
CREATININE: 1.56 mg/dL — AB (ref 0.50–1.35)
Calcium: 9.1 mg/dL (ref 8.4–10.5)
Chloride: 109 mEq/L (ref 96–112)
GFR calc Af Amer: 46 mL/min — ABNORMAL LOW (ref >90)
GFR, EST NON AFRICAN AMERICAN: 40 mL/min — AB (ref >90)
Glucose, Bld: 75 mg/dL (ref 70–99)
Potassium: 3.4 mEq/L — ABNORMAL LOW (ref 3.7–5.3)
Sodium: 144 mEq/L (ref 137–147)
Total Bilirubin: 0.4 mg/dL (ref 0.3–1.2)
Total Protein: 6.5 g/dL (ref 6.0–8.3)

## 2014-01-08 LAB — CBC WITH DIFFERENTIAL/PLATELET
BASOS PCT: 0 % (ref 0–1)
Basophils Absolute: 0 10*3/uL (ref 0.0–0.1)
EOS ABS: 0 10*3/uL (ref 0.0–0.7)
EOS PCT: 1 % (ref 0–5)
HCT: 35.6 % — ABNORMAL LOW (ref 39.0–52.0)
Hemoglobin: 12.4 g/dL — ABNORMAL LOW (ref 13.0–17.0)
Lymphocytes Relative: 34 % (ref 12–46)
Lymphs Abs: 2.2 10*3/uL (ref 0.7–4.0)
MCH: 28.7 pg (ref 26.0–34.0)
MCHC: 34.8 g/dL (ref 30.0–36.0)
MCV: 82.4 fL (ref 78.0–100.0)
Monocytes Absolute: 0.7 10*3/uL (ref 0.1–1.0)
Monocytes Relative: 11 % (ref 3–12)
Neutro Abs: 3.6 10*3/uL (ref 1.7–7.7)
Neutrophils Relative %: 54 % (ref 43–77)
PLATELETS: 123 10*3/uL — AB (ref 150–400)
RBC: 4.32 MIL/uL (ref 4.22–5.81)
RDW: 15.4 % (ref 11.5–15.5)
WBC: 6.6 10*3/uL (ref 4.0–10.5)

## 2014-01-08 LAB — TROPONIN I: Troponin I: <0.3 ng/mL (ref <0.30)

## 2014-01-08 LAB — PROTIME-INR
INR: 3.15 — ABNORMAL HIGH (ref 0.00–1.49)
PROTHROMBIN TIME: 32.6 s — AB (ref 11.6–15.2)

## 2014-01-08 MED ORDER — LORATADINE 10 MG PO TABS
10.0000 mg | ORAL_TABLET | Freq: Every day | ORAL | Status: DC
  Administered 2014-01-09 – 2014-01-11 (×3): 10 mg via ORAL
  Filled 2014-01-08 (×4): qty 1

## 2014-01-08 MED ORDER — SODIUM CHLORIDE 0.9 % IJ SOLN
3.0000 mL | Freq: Two times a day (BID) | INTRAMUSCULAR | Status: DC
  Administered 2014-01-08 – 2014-01-11 (×7): 3 mL via INTRAVENOUS

## 2014-01-08 MED ORDER — ATORVASTATIN CALCIUM 40 MG PO TABS
40.0000 mg | ORAL_TABLET | Freq: Every day | ORAL | Status: DC
Start: 2014-01-08 — End: 2014-01-11
  Administered 2014-01-08 – 2014-01-11 (×4): 40 mg via ORAL
  Filled 2014-01-08 (×4): qty 1

## 2014-01-08 MED ORDER — FLUTICASONE PROPIONATE 50 MCG/ACT NA SUSP
1.0000 | Freq: Two times a day (BID) | NASAL | Status: DC
  Administered 2014-01-08 – 2014-01-11 (×6): 1 via NASAL
  Filled 2014-01-08: qty 16

## 2014-01-08 MED ORDER — WARFARIN SODIUM 2 MG PO TABS: 2.0000 mg | ORAL_TABLET | Freq: Every day | ORAL | Status: DC

## 2014-01-08 MED ORDER — DORZOLAMIDE HCL-TIMOLOL MAL 2-0.5 % OP SOLN
1.0000 [drp] | Freq: Three times a day (TID) | OPHTHALMIC | Status: DC
  Administered 2014-01-08 – 2014-01-11 (×10): 1 [drp] via OPHTHALMIC
  Filled 2014-01-08: qty 10

## 2014-01-08 MED ORDER — AMIODARONE HCL 200 MG PO TABS
200.0000 mg | ORAL_TABLET | Freq: Every day | ORAL | Status: DC
  Administered 2014-01-08 – 2014-01-11 (×4): 200 mg via ORAL
  Filled 2014-01-08 (×4): qty 1

## 2014-01-08 MED ORDER — ISOSORBIDE MONONITRATE ER 60 MG PO TB24
60.0000 mg | ORAL_TABLET | Freq: Every day | ORAL | Status: DC
  Administered 2014-01-08 – 2014-01-11 (×4): 60 mg via ORAL
  Filled 2014-01-08 (×4): qty 1

## 2014-01-08 MED ORDER — POTASSIUM CHLORIDE CRYS ER 20 MEQ PO TBCR
20.0000 meq | EXTENDED_RELEASE_TABLET | Freq: Once | ORAL | Status: AC
  Administered 2014-01-09: 20 meq via ORAL
  Filled 2014-01-08: qty 1

## 2014-01-08 MED ORDER — ASPIRIN 81 MG PO CHEW
324.0000 mg | CHEWABLE_TABLET | ORAL | Status: AC
  Administered 2014-01-08: 324 mg via ORAL
  Filled 2014-01-08: qty 4

## 2014-01-08 MED ORDER — FUROSEMIDE 40 MG PO TABS
40.0000 mg | ORAL_TABLET | Freq: Every day | ORAL | Status: DC
  Administered 2014-01-08 – 2014-01-11 (×4): 40 mg via ORAL
  Filled 2014-01-08 (×4): qty 1

## 2014-01-08 MED ORDER — WARFARIN SODIUM 2 MG PO TABS
2.0000 mg | ORAL_TABLET | Freq: Once | ORAL | Status: AC
Start: 2014-01-08 — End: 2014-01-08
  Administered 2014-01-08: 2 mg via ORAL
  Filled 2014-01-08: qty 1

## 2014-01-08 MED ORDER — HYDROCODONE-ACETAMINOPHEN 5-325 MG PO TABS
2.0000 | ORAL_TABLET | ORAL | Status: DC | PRN
  Administered 2014-01-09 – 2014-01-11 (×3): 2 via ORAL
  Filled 2014-01-08 (×3): qty 2

## 2014-01-08 MED ORDER — CARVEDILOL 3.125 MG PO TABS
3.1250 mg | ORAL_TABLET | Freq: Two times a day (BID) | ORAL | Status: DC
  Administered 2014-01-08 – 2014-01-11 (×7): 3.125 mg via ORAL
  Filled 2014-01-08 (×7): qty 1

## 2014-01-08 MED ORDER — ASPIRIN EC 81 MG PO TBEC
81.0000 mg | DELAYED_RELEASE_TABLET | Freq: Every day | ORAL | Status: DC
  Administered 2014-01-09 – 2014-01-11 (×3): 81 mg via ORAL
  Filled 2014-01-08 (×3): qty 1

## 2014-01-08 MED ORDER — POTASSIUM CHLORIDE CRYS ER 20 MEQ PO TBCR
20.0000 meq | EXTENDED_RELEASE_TABLET | Freq: Once | ORAL | Status: AC
  Administered 2014-01-08: 20 meq via ORAL
  Filled 2014-01-08: qty 1

## 2014-01-08 MED ORDER — SODIUM CHLORIDE 0.9 % IJ SOLN: 3.0000 mL | INTRAMUSCULAR | Status: DC | PRN

## 2014-01-08 MED ORDER — ACETAMINOPHEN 325 MG PO TABS: 650.0000 mg | ORAL_TABLET | ORAL | Status: DC | PRN

## 2014-01-08 MED ORDER — LINAGLIPTIN 5 MG PO TABS
5.0000 mg | ORAL_TABLET | Freq: Every day | ORAL | Status: DC
  Administered 2014-01-08 – 2014-01-11 (×4): 5 mg via ORAL
  Filled 2014-01-08 (×5): qty 1

## 2014-01-08 MED ORDER — WARFARIN SODIUM 2 MG PO TABS
2.0000 mg | ORAL_TABLET | ORAL | Status: DC
  Administered 2014-01-10: 2 mg via ORAL
  Filled 2014-01-08: qty 1

## 2014-01-08 MED ORDER — SODIUM CHLORIDE 0.9 % IV SOLN
250.0000 mL | INTRAVENOUS | Status: DC | PRN
Start: 2014-01-08 — End: 2014-01-11

## 2014-01-08 MED ORDER — LOSARTAN POTASSIUM 50 MG PO TABS
100.0000 mg | ORAL_TABLET | Freq: Every day | ORAL | Status: DC
  Administered 2014-01-08 – 2014-01-11 (×4): 100 mg via ORAL
  Filled 2014-01-08 (×4): qty 2

## 2014-01-08 MED ORDER — SPIRONOLACTONE 25 MG PO TABS
25.0000 mg | ORAL_TABLET | Freq: Every day | ORAL | Status: DC
  Administered 2014-01-08 – 2014-01-11 (×4): 25 mg via ORAL
  Filled 2014-01-08 (×4): qty 1

## 2014-01-08 MED ORDER — PANTOPRAZOLE SODIUM 40 MG PO TBEC
40.0000 mg | DELAYED_RELEASE_TABLET | Freq: Every day | ORAL | Status: DC
  Administered 2014-01-08 – 2014-01-11 (×4): 40 mg via ORAL
  Filled 2014-01-08 (×4): qty 1

## 2014-01-08 MED ORDER — FAMOTIDINE 20 MG PO TABS
10.0000 mg | ORAL_TABLET | Freq: Every day | ORAL | Status: DC
  Administered 2014-01-08 – 2014-01-11 (×4): 10 mg via ORAL
  Filled 2014-01-08 (×4): qty 1

## 2014-01-08 MED ORDER — ASPIRIN 300 MG RE SUPP: 300.0000 mg | RECTAL | Status: AC

## 2014-01-08 MED ORDER — NITROGLYCERIN 0.4 MG SL SUBL: 0.4000 mg | SUBLINGUAL_TABLET | SUBLINGUAL | Status: DC | PRN

## 2014-01-08 MED ORDER — LEVOTHYROXINE SODIUM 50 MCG PO TABS
50.0000 ug | ORAL_TABLET | Freq: Every day | ORAL | Status: DC
  Administered 2014-01-08 – 2014-01-11 (×4): 50 ug via ORAL
  Filled 2014-01-08 (×4): qty 1

## 2014-01-08 MED ORDER — WARFARIN SODIUM 4 MG PO TABS
4.0000 mg | ORAL_TABLET | ORAL | Status: DC
  Administered 2014-01-09 – 2014-01-11 (×2): 4 mg via ORAL
  Filled 2014-01-08 (×2): qty 1

## 2014-01-08 MED ORDER — PROMETHAZINE HCL 25 MG PO TABS: 25.0000 mg | ORAL_TABLET | Freq: Four times a day (QID) | ORAL | Status: DC | PRN

## 2014-01-08 MED ORDER — ONDANSETRON HCL 4 MG/2ML IJ SOLN
4.0000 mg | Freq: Four times a day (QID) | INTRAMUSCULAR | Status: DC | PRN
  Administered 2014-01-09 – 2014-01-10 (×2): 4 mg via INTRAVENOUS
  Filled 2014-01-08 (×2): qty 2

## 2014-01-08 MED ORDER — TAMSULOSIN HCL 0.4 MG PO CAPS
0.4000 mg | ORAL_CAPSULE | Freq: Every day | ORAL | Status: DC
  Administered 2014-01-08 – 2014-01-10 (×3): 0.4 mg via ORAL
  Filled 2014-01-08 (×4): qty 1

## 2014-01-08 MED ORDER — WARFARIN - PHYSICIAN DOSING INPATIENT
Freq: Every day | Status: DC
  Administered 2014-01-08: 18:00:00

## 2014-01-08 NOTE — H&P (Signed)
Referring Physician: Charolette Forward, MD  [name] is an 80 y.o. male.                       Chief Complaint: Chest pain  HPI: 80 year old male with past medical history significant for coronary artery disease history of MI x2 in the past status post PCI to LAD left circumflex and RCA in the past, ischemic cardiomyopathy, history of congestive heart failure secondary to systolic dysfunction, history of bilateral pulmonary embolism, history of paroxysmal A. fib, hypercholesteremia, non-insulin-dependent diabetes matters, history of DVT in the past, remote tobacco abuse, chronic kidney disease stage III, hypothyroidism, history of pancreatitis in the past, GERD, chronic abdominal pain, had extensive GI workup in the past came to office complaining of recurrent retrosternal chest pain described as pressure tightness radiating to left shoulder off and on for last few days. No sweating spell or shortness of breath. No fever or cough. Also has abdominal pain over left inguinal hernia area without vomiting but with nausea.  Past Medical History  Diagnosis Date  . GERD (gastroesophageal reflux disease)   . Chronic back pain   . Gout     no problems in 2 years  . Pulmonary embolism, bilateral 2010  . Hyperlipemia   . Anginal pain   . DVT, lower extremity 1980's    LLE  . Asthma     "touch"  . Pneumonia 1970's  . Arthritis     "mostly in my knees and shoulders"  . Collapsed lung     "for 14 years" right   . Diabetes mellitus   . Hypertension   . Depression   . Anxiety   . Hearing loss   . Glaucoma   . Complication of anesthesia   . PONV (postoperative nausea and vomiting)   . Renal disorder ~ 03/2011    "wasn't functioning right"  . Stroke   . Stroke     "just a little one"  . Hypothyroidism   . Chronic abdominal pain   . Paroxysmal atrial fibrillation   . Myocardial infarction 1987; 1989      Past Surgical History  Procedure Laterality Date  . Esophagogastroduodenoscopy   12/22/2010    Procedure: ESOPHAGOGASTRODUODENOSCOPY (EGD);  Surgeon: Beryle Beams;  Location: [facility];  Service: Endoscopy;  Laterality: N/A;  . Appendectomy  1950  . Cataract extraction w/ intraocular lens  implant, bilateral Bilateral 1990's  . Coronary angioplasty with stent placement      "think I have 4 total"  . Inguinal hernia repair Bilateral 03/28/2012    Procedure: LAPAROSCOPIC BILATERAL INGUINAL HERNIA REPAIR;  Surgeon: Adin Hector, MD;  Location: [facility];  Service: General;  Laterality: Bilateral;  . Insertion of mesh Bilateral 03/28/2012    Procedure: INSERTION OF MESH;  Surgeon: Adin Hector, MD;  Location: [facility];  Service: General;  Laterality: Bilateral;  . Hernia repair    . Esophagogastroduodenoscopy N/A 04/30/2012    Procedure: ESOPHAGOGASTRODUODENOSCOPY (EGD);  Surgeon: Beryle Beams, MD;  Location: [facility] ENDOSCOPY;  Service: Endoscopy;  Laterality: N/A;  . Colonoscopy N/A 05/01/2012    Procedure: COLONOSCOPY;  Surgeon: Beryle Beams, MD;  Location: [facility];  Service: Endoscopy;  Laterality: N/A;  . Cardiac catheterization    . Left heart catheterization with coronary angiogram N/A 02/19/2011    Procedure: LEFT HEART CATHETERIZATION WITH CORONARY ANGIOGRAM;  Surgeon: Clent Demark, MD;  Location: [facility] CATH LAB;  Service: Cardiovascular;  Laterality: N/A;  Family History  Problem Relation Age of Onset  . Diabetes Mother   . Heart disease Father   . Lung cancer Brother   . Stroke Neg Hx   . Hypertension Neg Hx   . Hyperlipidemia Neg Hx   . Alcohol abuse Neg Hx    Social History:  reports that he quit smoking about 32 years ago. His smoking use included Cigarettes. He started smoking about 67 years ago. He has a 6 pack-year smoking history. He has never used smokeless tobacco. He reports that he does not drink alcohol or use illicit drugs.  Allergies: No Known Allergies  Medications Prior to Admission  Medication Sig Dispense Refill  . acetaminophen  (TYLENOL) 500 MG tablet Take 1,000 mg by mouth every 6 (six) hours as needed (pain).     Marland Kitchen amiodarone (PACERONE) 200 MG tablet Take 200 mg by mouth daily.     Marland Kitchen atorvastatin (LIPITOR) 40 MG tablet Take 40 mg by mouth daily.    . carvedilol (COREG) 3.125 MG tablet Take 3.125 mg by mouth 2 (two) times daily with a meal. 8am, 6pm    . cetirizine (ZYRTEC) 10 MG tablet Take 10 mg by mouth daily.    . dorzolamide-timolol (COSOPT) 22.3-6.8 MG/ML ophthalmic solution Place 1 drop into the right eye 3 (three) times daily.    Marland Kitchen esomeprazole (NEXIUM) 40 MG capsule Take 40 mg by mouth daily.     . fluticasone (FLONASE) 50 MCG/ACT nasal spray Place 1 spray into both nostrils 2 (two) times daily.     . furosemide (LASIX) 40 MG tablet Take 40 mg by mouth daily.     . isosorbide mononitrate (IMDUR) 60 MG 24 hr tablet Take 60 mg by mouth daily.    Marland Kitchen levothyroxine (SYNTHROID, LEVOTHROID) 50 MCG tablet Take 50 mcg by mouth daily before breakfast.     . linagliptin (TRADJENTA) 5 MG TABS tablet Take 5 mg by mouth daily.    Marland Kitchen losartan (COZAAR) 100 MG tablet Take 100 mg by mouth daily.    . nitroGLYCERIN (NITROSTAT) 0.4 MG SL tablet Place 0.4 mg under the tongue every 5 (five) minutes as needed for chest pain.    . promethazine (PHENERGAN) 25 MG tablet Take 1 tablet (25 mg total) by mouth every 6 (six) hours as needed for nausea or vomiting. 30 tablet 0  . ranitidine (ZANTAC) 300 MG tablet Take 300 mg by mouth 2 (two) times daily.   2  . spironolactone (ALDACTONE) 25 MG tablet Take 25 mg by mouth daily.    . tamsulosin (FLOMAX) 0.4 MG CAPS capsule Take 0.4 mg by mouth at bedtime.     Marland Kitchen warfarin (COUMADIN) 4 MG tablet Take 2-4 mg by mouth daily at 6 PM. Take 1/2 tablet (2 mg) on Sundays, take 1 tablet (4 mg) Monday through Saturday    . HYDROcodone-acetaminophen (NORCO/VICODIN) 5-325 MG per tablet Take 2 tablets by mouth every 4 (four) hours as needed for moderate pain or severe pain. 6 tablet 0    Results for orders  placed or performed during the hospital encounter of 01/08/14 (from the past 48 hour(s))  Comprehensive metabolic panel     Status: Abnormal   Collection Time: 01/08/14  2:55 PM  Result Value Ref Range   Sodium 144 137 - 147 mEq/L   Potassium 3.4 (L) 3.7 - 5.3 mEq/L   Chloride 109 96 - 112 mEq/L   CO2 25 19 - 32 mEq/L   Glucose, Bld 75 70 -  99 mg/dL   BUN 17 6 - 23 mg/dL   Creatinine, Ser 1.56 (H) 0.50 - 1.35 mg/dL   Calcium 9.1 8.4 - 10.5 mg/dL   Total Protein 6.5 6.0 - 8.3 g/dL   Albumin 3.2 (L) 3.5 - 5.2 g/dL   AST 20 0 - 37 U/L   ALT 16 0 - 53 U/L   Alkaline Phosphatase 81 39 - 117 U/L   Total Bilirubin 0.4 0.3 - 1.2 mg/dL   GFR calc non Af Amer 40 (L) >90 mL/min   GFR calc Af Amer 46 (L) >90 mL/min    Comment: (NOTE) The eGFR has been calculated using the CKD EPI equation. This calculation has not been validated in all clinical situations. eGFR's persistently <90 mL/min signify possible Chronic Kidney Disease.    Anion gap 10 5 - 15  Troponin I-(serum)     Status: None   Collection Time: 01/08/14  2:55 PM  Result Value Ref Range   Troponin I <0.30 <0.30 ng/mL    Comment:        Due to the release kinetics of cTnI, a negative result within the first hours of the onset of symptoms does not rule out myocardial infarction with certainty. If myocardial infarction is still suspected, repeat the test at appropriate intervals.   CBC WITH DIFFERENTIAL     Status: Abnormal   Collection Time: 01/08/14  2:55 PM  Result Value Ref Range   WBC 6.6 4.0 - 10.5 K/uL   RBC 4.32 4.22 - 5.81 MIL/uL   Hemoglobin 12.4 (L) 13.0 - 17.0 g/dL   HCT 35.6 (L) 39.0 - 52.0 %   MCV 82.4 78.0 - 100.0 fL   MCH 28.7 26.0 - 34.0 pg   MCHC 34.8 30.0 - 36.0 g/dL   RDW 15.4 11.5 - 15.5 %   Platelets 123 (L) 150 - 400 K/uL   Neutrophils Relative % 54 43 - 77 %   Neutro Abs 3.6 1.7 - 7.7 K/uL   Lymphocytes Relative 34 12 - 46 %   Lymphs Abs 2.2 0.7 - 4.0 K/uL   Monocytes Relative 11 3 - 12 %    Monocytes Absolute 0.7 0.1 - 1.0 K/uL   Eosinophils Relative 1 0 - 5 %   Eosinophils Absolute 0.0 0.0 - 0.7 K/uL   Basophils Relative 0 0 - 1 %   Basophils Absolute 0.0 0.0 - 0.1 K/uL  Protime-INR     Status: Abnormal   Collection Time: 01/08/14  2:55 PM  Result Value Ref Range   Prothrombin Time 32.6 (H) 11.6 - 15.2 seconds   INR 3.15 (H) 0.00 - 1.49   No results found.  Review Of Systems Constitutional: Negative for fever and chills.  Eyes: Negative for double vision and photophobia.  Respiratory: Negative for cough, hemoptysis and sputum production.  Cardiovascular: Positive for chest pain and leg swelling. Negative for orthopnea and claudication.  Gastrointestinal: Negative for nausea, vomiting and abdominal pain.  Genitourinary: Negative for dysuria.  Neurological: Negative for dizziness and headaches.   Blood pressure 164/81, pulse 60, resp. rate 18, height 5' 8.5" (1.74 m), weight 86.728 kg (191 lb 3.2 oz), SpO2 99 %. Physical Exam  Constitutional: Averagely built and well nourished.  HENT: Head: Normocephalic and atraumatic. Brown eyes, Conjunctivae are normal. No scleral icterus.  Neck: Normal range of motion. Neck supple. No JVD present. No thyromegaly present.  Cardiovascular: Normal rate and regular rhythm. S1 and S2 normal. II/VI systolic murmur. Respiratory: Clear bilaterally.  GI: Soft. Bowel sounds are normal. He exhibits no distension. There is no tenderness. There is no rebound.  Musculoskeletal: No clubbing or cyanosis, 1+ edema noted  Neurological: He is alert and oriented to person, place, and time. Moves all 4 extremities.  Assessment/Plan Unstable angina rule out MI Coronary artery disease history of MI in the past and multivessel PCI as above Hypertension Diabetes mellitus, II Chronic kidney disease stage III Compensated systolic heart failure Ischemic cardiomyopathy History of paroxysmal A. fib in the past Hypercholesteremia History of DVT in the  past History of PE in the past GERD History of chronic abdominal pain History of pancreatitis Hypothyroidism Hypercholesteremia  Admit/ r/o MI. Home medications.  Birdie Riddle, MD  01/08/2014, 5:24 PM

## 2014-01-09 ENCOUNTER — Inpatient Hospital Stay (HOSPITAL_COMMUNITY): Payer: Medicare Other

## 2014-01-09 DIAGNOSIS — I252 Old myocardial infarction: Secondary | ICD-10-CM | POA: Diagnosis not present

## 2014-01-09 DIAGNOSIS — R109 Unspecified abdominal pain: Secondary | ICD-10-CM | POA: Diagnosis not present

## 2014-01-09 DIAGNOSIS — I2510 Atherosclerotic heart disease of native coronary artery without angina pectoris: Secondary | ICD-10-CM | POA: Diagnosis not present

## 2014-01-09 DIAGNOSIS — R079 Chest pain, unspecified: Secondary | ICD-10-CM | POA: Diagnosis not present

## 2014-01-09 LAB — TROPONIN I: Troponin I: <0.3 ng/mL (ref <0.30)

## 2014-01-09 LAB — LIPID PANEL
Cholesterol: 130 mg/dL (ref 0–200)
HDL: 65 mg/dL (ref >39)
LDL CALC: 48 mg/dL (ref 0–99)
Total CHOL/HDL Ratio: 2 RATIO
Triglycerides: 83 mg/dL (ref <150)
VLDL: 17 mg/dL (ref 0–40)

## 2014-01-09 LAB — CBC
HEMATOCRIT: 37.5 % — AB (ref 39.0–52.0)
Hemoglobin: 12.9 g/dL — ABNORMAL LOW (ref 13.0–17.0)
MCH: 29.2 pg (ref 26.0–34.0)
MCHC: 34.4 g/dL (ref 30.0–36.0)
MCV: 84.8 fL (ref 78.0–100.0)
Platelets: 122 10*3/uL — ABNORMAL LOW (ref 150–400)
RBC: 4.42 MIL/uL (ref 4.22–5.81)
RDW: 15.6 % — ABNORMAL HIGH (ref 11.5–15.5)
WBC: 5.8 10*3/uL (ref 4.0–10.5)

## 2014-01-09 LAB — BASIC METABOLIC PANEL
Anion gap: 11 (ref 5–15)
BUN: 17 mg/dL (ref 6–23)
CO2: 26 mEq/L (ref 19–32)
CREATININE: 1.67 mg/dL — AB (ref 0.50–1.35)
Calcium: 8.9 mg/dL (ref 8.4–10.5)
Chloride: 103 mEq/L (ref 96–112)
GFR calc non Af Amer: 37 mL/min — ABNORMAL LOW (ref >90)
GFR, EST AFRICAN AMERICAN: 42 mL/min — AB (ref >90)
Glucose, Bld: 132 mg/dL — ABNORMAL HIGH (ref 70–99)
Potassium: 3.3 mEq/L — ABNORMAL LOW (ref 3.7–5.3)
Sodium: 140 mEq/L (ref 137–147)

## 2014-01-09 LAB — GLUCOSE, CAPILLARY: Glucose-Capillary: 112 mg/dL — ABNORMAL HIGH (ref 70–99)

## 2014-01-09 MED ORDER — REGADENOSON 0.4 MG/5ML IV SOLN
0.4000 mg | Freq: Once | INTRAVENOUS | Status: AC
  Administered 2014-01-09: 0.4 mg via INTRAVENOUS
  Filled 2014-01-09: qty 5

## 2014-01-09 MED ORDER — TECHNETIUM TC 99M SESTAMIBI GENERIC - CARDIOLITE
30.0000 | Freq: Once | INTRAVENOUS | Status: AC | PRN
  Administered 2014-01-09: 30 via INTRAVENOUS

## 2014-01-09 MED ORDER — REGADENOSON 0.4 MG/5ML IV SOLN
INTRAVENOUS | Status: AC
  Filled 2014-01-09: qty 5

## 2014-01-09 MED ORDER — BISACODYL 10 MG RE SUPP
10.0000 mg | Freq: Once | RECTAL | Status: AC
  Administered 2014-01-09: 10 mg via RECTAL
  Filled 2014-01-09: qty 1

## 2014-01-09 MED ORDER — MAGNESIUM HYDROXIDE 400 MG/5ML PO SUSP
5.0000 mL | Freq: Every day | ORAL | Status: DC | PRN
  Filled 2014-01-09: qty 30

## 2014-01-09 MED ORDER — TECHNETIUM TC 99M SESTAMIBI GENERIC - CARDIOLITE
10.0000 | Freq: Once | INTRAVENOUS | Status: AC | PRN
  Administered 2014-01-09: 10 via INTRAVENOUS

## 2014-01-10 ENCOUNTER — Inpatient Hospital Stay (HOSPITAL_COMMUNITY): Payer: Medicare Other

## 2014-01-10 DIAGNOSIS — I2510 Atherosclerotic heart disease of native coronary artery without angina pectoris: Secondary | ICD-10-CM | POA: Diagnosis not present

## 2014-01-10 DIAGNOSIS — R109 Unspecified abdominal pain: Secondary | ICD-10-CM | POA: Diagnosis not present

## 2014-01-10 DIAGNOSIS — I252 Old myocardial infarction: Secondary | ICD-10-CM | POA: Diagnosis not present

## 2014-01-10 DIAGNOSIS — R079 Chest pain, unspecified: Secondary | ICD-10-CM | POA: Diagnosis not present

## 2014-01-10 LAB — GLUCOSE, CAPILLARY
GLUCOSE-CAPILLARY: 104 mg/dL — AB (ref 70–99)
Glucose-Capillary: 137 mg/dL — ABNORMAL HIGH (ref 70–99)
Glucose-Capillary: 84 mg/dL (ref 70–99)

## 2014-01-10 LAB — PROTIME-INR
INR: 3.25 — AB (ref 0.00–1.49)
Prothrombin Time: 33.4 seconds — ABNORMAL HIGH (ref 11.6–15.2)

## 2014-01-10 MED ORDER — MAGNESIUM CITRATE PO SOLN
300.0000 mL | Freq: Once | ORAL | Status: AC
  Administered 2014-01-10: 300 mL via ORAL
  Filled 2014-01-10: qty 592

## 2014-01-10 MED ORDER — ALUM & MAG HYDROXIDE-SIMETH 200-200-20 MG/5ML PO SUSP
30.0000 mL | ORAL | Status: DC | PRN
Start: 2014-01-10 — End: 2014-01-11
  Administered 2014-01-10: 30 mL via ORAL
  Filled 2014-01-10: qty 30

## 2014-01-10 MED ORDER — BISACODYL 10 MG RE SUPP
10.0000 mg | Freq: Once | RECTAL | Status: AC
  Administered 2014-01-10: 10 mg via RECTAL
  Filled 2014-01-10: qty 1

## 2014-01-10 NOTE — Evaluation (Signed)
Physical Therapy Evaluation Patient Details Name: Blake LaudJames [name] MRN: [id number] DOB: July 10, 1929 Today's Date: 01/10/2014   History of Present Illness    80 year old male with past medical history significant for coronary artery disease history of MI x2 in the past status post PCI to LAD left circumflex and RCA in the past, ischemic cardiomyopathy, history of congestive heart failure secondary to systolic dysfunction, history of bilateral pulmonary embolism, history of paroxysmal A. fib, hypercholesteremia, non-insulin-dependent diabetes matters, history of DVT in the past, remote tobacco abuse, chronic kidney disease stage III, hypothyroidism, history of pancreatitis in the past, GERD, chronic abdominal pain, had extensive GI workup in the past came to office complaining of recurrent retrosternal chest pain described as pressure tightness radiating to left shoulder off and on for last few days. No sweating spell or shortness of breath. No fever or cough. Also has abdominal pain over left inguinal hernia area without vomiting but with nausea.   Clinical Impression  Pt presents with moderate limitations to functional mobility related to chronic pain and general deconditioning.  Pt states he lives in ALF and if true anticipate he should be able to return home at d/c with HHPT.  If not, he will need 24 hour supervision.  Recommend walk in hall with nursing staff, will initiate PT in acute setting and plan for referral to HHPT at d/c.      Follow Up Recommendations Home health PT    Equipment Recommendations  None recommended by PT    Recommendations for Other Services       Precautions / Restrictions Precautions Precautions: Fall Precaution Comments: RW in room, please use      Mobility  Bed Mobility Overal bed mobility: Modified Independent                Transfers Overall transfer level: Needs assistance   Transfers: Sit to/from Stand Sit to Stand: Supervision          General transfer comment: standby for safety; knee buckles occasionally;   Ambulation/Gait Ambulation/Gait assistance: Min assist Ambulation Distance (Feet): 125 Feet Assistive device: Rolling walker (2 wheeled) Gait Pattern/deviations: Step-through pattern;Decreased stride length;Antalgic;Narrow base of support   Gait velocity interpretation: Below normal speed for age/gender General Gait Details: very short stride, unable to incr due to right knee pain  Stairs            Wheelchair Mobility    Modified Rankin (Stroke Patients Only)       Balance Overall balance assessment: Needs assistance Sitting-balance support: Feet supported;No upper extremity supported Sitting balance-Leahy Scale: Fair     Standing balance support: Bilateral upper extremity supported;During functional activity Standing balance-Leahy Scale: Fair                               Pertinent Vitals/Pain Pain Assessment: 0-10 Pain Score: 4  Pain Location: knees, back Pain Intervention(s): Limited activity within patient's tolerance;Monitored during session;Repositioned    Home Living Family/patient expects to be discharged to:: Assisted living Living Arrangements: Other (Comment)             Home Equipment: Dan HumphreysWalker - 2 wheels;Cane - quad Additional Comments: per pt he lives in ALF in [place]?    Prior Function Level of Independence: Needs assistance         Comments: independent including meals, walks in halls/parking lot and occ has someone clean room     Hand Dominance  Extremity/Trunk Assessment   Upper Extremity Assessment: Overall WFL for tasks assessed           Lower Extremity Assessment: Overall WFL for tasks assessed         Communication   Communication: HOH (difficult to understand (dentition))  Cognition Arousal/Alertness: Awake/alert Behavior During Therapy: WFL for tasks assessed/performed Overall Cognitive Status: Within  Functional Limits for tasks assessed                      General Comments      Exercises        Assessment/Plan    PT Assessment Patient needs continued PT services  PT Diagnosis Difficulty walking   PT Problem List Decreased activity tolerance;Decreased balance;Decreased mobility;Decreased knowledge of use of DME;Cardiopulmonary status limiting activity;Pain  PT Treatment Interventions DME instruction;Gait training;Functional mobility training;Therapeutic activities;Therapeutic exercise;Patient/family education   PT Goals (Current goals can be found in the Care Plan section) Acute Rehab PT Goals Patient Stated Goal: walk better without back hurting PT Goal Formulation: With patient Time For Goal Achievement: 01/24/14 Potential to Achieve Goals: Good    Frequency Min 3X/week   Barriers to discharge   if truly ALF, should be able to return home with HHPT    Co-evaluation               End of Session Equipment Utilized During Treatment: Gait belt Activity Tolerance: Patient limited by pain Patient left: in chair;with call bell/phone within reach Nurse Communication: Mobility status         Time: 1311-1335 PT Time Calculation (min) (ACUTE ONLY): 24 min   Charges:   PT Evaluation $Initial PT Evaluation Tier I: 1 Procedure PT Treatments $Gait Training: 8-22 mins $Therapeutic Activity: 8-22 mins   PT G Codes:          Dennis BastMartin, Eligha Kmetz Galloway 01/10/2014, 1:40 PM

## 2014-01-10 NOTE — Progress Notes (Signed)
*   Echocardiogram 2D Echocardiogram has been performed.  [name], [name] A 01/10/2014, 11:01 AM

## 2014-01-10 NOTE — Progress Notes (Signed)
Ref: Robynn PaneHARWANI,MOHAN N, MD   Subjective:  Abdominal discomfort continues. X-ray positive for moderate constipation.  Objective:  Vital Signs in the last 24 hours: Temp:  [97.5 F (36.4 C)-98 F (36.7 C)] 98 F (36.7 C) (12/20 1500) Pulse Rate:  [53-59] 53 (12/20 1500) Cardiac Rhythm:  [-] Normal sinus rhythm (12/20 0831) Resp:  [18-20] 18 (12/20 1500) BP: (142-176)/(65-83) 142/65 mmHg (12/20 1500) SpO2:  [97 %-100 %] 99 % (12/20 1500) Weight:  [84.687 kg (186 lb 11.2 oz)] 84.687 kg (186 lb 11.2 oz) (12/20 0339)  Physical Exam: BP Readings from Last 1 Encounters:  01/10/14 142/65    Wt Readings from Last 1 Encounters:  01/10/14 84.687 kg (186 lb 11.2 oz)    Weight change: -2.041 kg (-4 lb 8 oz)  HEENT: [place]/AT, Eyes-Brown, PERL, EOMI, Conjunctiva-Pink, Sclera-Non-icteric Neck: No JVD, No bruit, Trachea midline. Lungs:  Clear, Bilateral. Cardiac:  Regular rhythm, normal S1 and S2, no S3. II/VI systolic murmur. Abdomen:  Soft, non-tender. Extremities:  1 + edema present. No cyanosis. No clubbing. CNS: AxOx3, Cranial nerves grossly intact, moves all 4 extremities. Right handed. Skin: Warm and dry.   Intake/Output from previous day: 12/19 0701 - 12/20 0700 In: 603 [P.O.:600; I.V.:3] Out: 150 [Urine:150]    Lab Results: BMET    Component Value Date/Time   NA 140 01/09/2014 0141   NA 144 01/08/2014 1455   NA 145 08/20/2013 1016   K 3.3* 01/09/2014 0141   K 3.4* 01/08/2014 1455   K 4.2 08/20/2013 1016   CL 103 01/09/2014 0141   CL 109 01/08/2014 1455   CL 108 08/20/2013 1016   CO2 26 01/09/2014 0141   CO2 25 01/08/2014 1455   CO2 24 08/20/2013 1016   GLUCOSE 132* 01/09/2014 0141   GLUCOSE 75 01/08/2014 1455   GLUCOSE 109* 08/20/2013 1016   BUN 17 01/09/2014 0141   BUN 17 01/08/2014 1455   BUN 21 08/20/2013 1016   CREATININE 1.67* 01/09/2014 0141   CREATININE 1.56* 01/08/2014 1455   CREATININE 1.57* 08/20/2013 1016   CALCIUM 8.9 01/09/2014 0141   CALCIUM 9.1  01/08/2014 1455   CALCIUM 9.2 08/20/2013 1016   GFRNONAA 37* 01/09/2014 0141   GFRNONAA 40* 01/08/2014 1455   GFRNONAA 40* 08/20/2013 1016   GFRAA 42* 01/09/2014 0141   GFRAA 46* 01/08/2014 1455   GFRAA 46* 08/20/2013 1016   CBC    Component Value Date/Time   WBC 5.8 01/09/2014 0141   RBC 4.42 01/09/2014 0141   HGB 12.9* 01/09/2014 0141   HCT 37.5* 01/09/2014 0141   PLT 122* 01/09/2014 0141   MCV 84.8 01/09/2014 0141   MCH 29.2 01/09/2014 0141   MCHC 34.4 01/09/2014 0141   RDW 15.6* 01/09/2014 0141   LYMPHSABS 2.2 01/08/2014 1455   MONOABS 0.7 01/08/2014 1455   EOSABS 0.0 01/08/2014 1455   BASOSABS 0.0 01/08/2014 1455   HEPATIC Function Panel  Recent Labs  08/19/13 1827 08/20/13 1016 01/08/14 1455  PROT 6.8 7.6 6.5   HEMOGLOBIN A1C No components found for: HGA1C,  MPG CARDIAC ENZYMES Lab Results  Component Value Date   CKTOTAL 48 07/19/2011   CKMB 1.4 07/19/2011   TROPONINI <0.30 01/09/2014   TROPONINI <0.30 01/08/2014   TROPONINI <0.30 01/08/2014   BNP  Recent Labs  07/15/13 1124  PROBNP 390.0   TSH  Recent Labs  07/15/13 1515  TSH 4.540*   CHOLESTEROL  Recent Labs  07/16/13 0330 01/09/14 0142  CHOL 154 130  Scheduled Meds: . amiodarone  200 mg Oral Daily  . aspirin EC  81 mg Oral Daily  . atorvastatin  40 mg Oral q1800  . carvedilol  3.125 mg Oral BID WC  . dorzolamide-timolol  1 drop Right Eye TID  . famotidine  10 mg Oral Daily  . fluticasone  1 spray Each Nare BID  . furosemide  40 mg Oral Daily  . isosorbide mononitrate  60 mg Oral Daily  . levothyroxine  50 mcg Oral QAC breakfast  . linagliptin  5 mg Oral Daily  . loratadine  10 mg Oral Daily  . losartan  100 mg Oral Daily  . pantoprazole  40 mg Oral Daily  . sodium chloride  3 mL Intravenous Q12H  . spironolactone  25 mg Oral Daily  . tamsulosin  0.4 mg Oral QHS  . warfarin  4 mg Oral Once per day on Mon Tue Wed Thu Fri Sat   And  . warfarin  2 mg Oral Q Sun-1800  .  Warfarin - Physician Dosing Inpatient   Does not apply q1800   Continuous Infusions:  PRN Meds:.sodium chloride, acetaminophen, alum & mag hydroxide-simeth, HYDROcodone-acetaminophen, nitroGLYCERIN, ondansetron (ZOFRAN) IV, promethazine, sodium chloride  Assessment/Plan: Unstable angina rule out MI Coronary artery disease history of MI in the past and multivessel PCI as above Hypertension Diabetes mellitus, II Chronic kidney disease stage III Compensated systolic heart failure Ischemic cardiomyopathy History of paroxysmal A. fib in the past Hypercholesteremia History of DVT in the past History of PE in the past GERD History of chronic abdominal pain History of pancreatitis Hypothyroidism Constipation  Soap Suds enema    LOS: 2 days    Orpah CobbAjay Yasenia Reedy  MD  01/10/2014, 4:48 PM

## 2014-01-10 NOTE — Progress Notes (Signed)
Ref: Robynn PaneHARWANI,MOHAN N, MD   Subjective:  Feeling better. Nuclear stress test with no reversible ischemia and 38 % EF.  Objective:  Vital Signs in the last 24 hours: Temp:  [97.5 F (36.4 C)-97.9 F (36.6 C)] 97.5 F (36.4 C) (12/19 2139) Pulse Rate:  [48-75] 53 (12/19 2139) Cardiac Rhythm:  [-]  Resp:  [18-20] 20 (12/19 2139) BP: (157-187)/(70-88) 159/75 mmHg (12/19 2139) SpO2:  [98 %-100 %] 100 % (12/19 2139) Weight:  [84.142 kg (185 lb 8 oz)] 84.142 kg (185 lb 8 oz) (12/19 0514)  Physical Exam: BP Readings from Last 1 Encounters:  01/09/14 159/75    Wt Readings from Last 1 Encounters:  01/09/14 84.142 kg (185 lb 8 oz)    Weight change:   HEENT: [place]/AT, Eyes-Brown, PERL, EOMI, Conjunctiva-Pink, Sclera-Non-icteric Neck: No JVD, No bruit, Trachea midline. Lungs:  Clear, Bilateral. Cardiac:  Regular rhythm, normal S1 and S2, no S3. II/VI systolic murmur. Abdomen:  Soft, non-tender. Extremities:  1 + edema present. No cyanosis. No clubbing. CNS: AxOx3, Cranial nerves grossly intact, moves all 4 extremities.  Skin: Warm and dry.   Intake/Output from previous day: 12/19 0701 - 12/20 0700 In: 603 [P.O.:600; I.V.:3] Out: -     Lab Results: BMET    Component Value Date/Time   NA 140 01/09/2014 0141   NA 144 01/08/2014 1455   NA 145 08/20/2013 1016   K 3.3* 01/09/2014 0141   K 3.4* 01/08/2014 1455   K 4.2 08/20/2013 1016   CL 103 01/09/2014 0141   CL 109 01/08/2014 1455   CL 108 08/20/2013 1016   CO2 26 01/09/2014 0141   CO2 25 01/08/2014 1455   CO2 24 08/20/2013 1016   GLUCOSE 132* 01/09/2014 0141   GLUCOSE 75 01/08/2014 1455   GLUCOSE 109* 08/20/2013 1016   BUN 17 01/09/2014 0141   BUN 17 01/08/2014 1455   BUN 21 08/20/2013 1016   CREATININE 1.67* 01/09/2014 0141   CREATININE 1.56* 01/08/2014 1455   CREATININE 1.57* 08/20/2013 1016   CALCIUM 8.9 01/09/2014 0141   CALCIUM 9.1 01/08/2014 1455   CALCIUM 9.2 08/20/2013 1016   GFRNONAA 37* 01/09/2014 0141   GFRNONAA 40* 01/08/2014 1455   GFRNONAA 40* 08/20/2013 1016   GFRAA 42* 01/09/2014 0141   GFRAA 46* 01/08/2014 1455   GFRAA 46* 08/20/2013 1016   CBC    Component Value Date/Time   WBC 5.8 01/09/2014 0141   RBC 4.42 01/09/2014 0141   HGB 12.9* 01/09/2014 0141   HCT 37.5* 01/09/2014 0141   PLT 122* 01/09/2014 0141   MCV 84.8 01/09/2014 0141   MCH 29.2 01/09/2014 0141   MCHC 34.4 01/09/2014 0141   RDW 15.6* 01/09/2014 0141   LYMPHSABS 2.2 01/08/2014 1455   MONOABS 0.7 01/08/2014 1455   EOSABS 0.0 01/08/2014 1455   BASOSABS 0.0 01/08/2014 1455   HEPATIC Function Panel  Recent Labs  08/19/13 1827 08/20/13 1016 01/08/14 1455  PROT 6.8 7.6 6.5   HEMOGLOBIN A1C No components found for: HGA1C,  MPG CARDIAC ENZYMES Lab Results  Component Value Date   CKTOTAL 48 07/19/2011   CKMB 1.4 07/19/2011   TROPONINI <0.30 01/09/2014   TROPONINI <0.30 01/08/2014   TROPONINI <0.30 01/08/2014   BNP  Recent Labs  07/15/13 1124  PROBNP 390.0   TSH  Recent Labs  07/15/13 1515  TSH 4.540*   CHOLESTEROL  Recent Labs  07/16/13 0330 01/09/14 0142  CHOL 154 130    Scheduled Meds: . amiodarone  200  mg Oral Daily  . aspirin EC  81 mg Oral Daily  . atorvastatin  40 mg Oral q1800  . carvedilol  3.125 mg Oral BID WC  . dorzolamide-timolol  1 drop Right Eye TID  . famotidine  10 mg Oral Daily  . fluticasone  1 spray Each Nare BID  . furosemide  40 mg Oral Daily  . isosorbide mononitrate  60 mg Oral Daily  . levothyroxine  50 mcg Oral QAC breakfast  . linagliptin  5 mg Oral Daily  . loratadine  10 mg Oral Daily  . losartan  100 mg Oral Daily  . pantoprazole  40 mg Oral Daily  . sodium chloride  3 mL Intravenous Q12H  . spironolactone  25 mg Oral Daily  . tamsulosin  0.4 mg Oral QHS  . warfarin  4 mg Oral Once per day on Mon Tue Wed Thu Fri Sat   And  . warfarin  2 mg Oral Q Sun-1800  . Warfarin - Physician Dosing Inpatient   Does not apply q1800   Continuous  Infusions:  PRN Meds:.sodium chloride, acetaminophen, HYDROcodone-acetaminophen, nitroGLYCERIN, ondansetron (ZOFRAN) IV, promethazine, sodium chloride  Assessment/Plan: Unstable angina rule out MI Coronary artery disease history of MI in the past and multivessel PCI as above Hypertension Diabetes mellitus, II Chronic kidney disease stage III Compensated systolic heart failure Ischemic cardiomyopathy History of paroxysmal A. fib in the past Hypercholesteremia History of DVT in the past History of PE in the past GERD History of chronic abdominal pain History of pancreatitis Hypothyroidism Hypercholesteremia  Increase activity. Home soon.     LOS: 2 days    Orpah CobbAjay Annalei Friesz  MD  01/10/2014, 12:26 AM

## 2014-01-11 DIAGNOSIS — R079 Chest pain, unspecified: Secondary | ICD-10-CM | POA: Diagnosis not present

## 2014-01-11 LAB — GLUCOSE, CAPILLARY
Glucose-Capillary: 103 mg/dL — ABNORMAL HIGH (ref 70–99)
Glucose-Capillary: 129 mg/dL — ABNORMAL HIGH (ref 70–99)
Glucose-Capillary: 133 mg/dL — ABNORMAL HIGH (ref 70–99)

## 2014-01-11 MED ORDER — POLYETHYLENE GLYCOL 3350 17 G PO PACK: 17.0000 g | PACK | Freq: Every day | ORAL | Status: DC

## 2014-01-11 NOTE — Discharge Summary (Signed)
Physician Discharge Summary  Patient ID: [name] MRN: [id number] DOB/AGE: [birth year] 80 y.o.  Admit date: 01/08/2014 Discharge date: 01/11/2014  Admission Diagnoses: Unstable angina rule out MI Coronary artery disease history of MI in the past and multivessel PCI as above Hypertension Diabetes mellitus, II Chronic kidney disease stage III Compensated systolic heart failure Ischemic cardiomyopathy History of paroxysmal A. fib in the past Hypercholesteremia History of DVT in the past History of PE in the past GERD History of chronic abdominal pain History of pancreatitis Hypothyroidism Constipation  Discharge Diagnoses:  Principal Problem:   Chest pain at rest Active Problems:   HYPERTENSION, BENIGN SYSTEMIC   CKD (chronic kidney disease) stage 3, GFR 30-59 ml/min   DJD (degenerative joint disease) of knee   Esophageal stricture   Schatzki's ring - nonobstructive   Coronary artery disease    History of MI in the past and multivessel PCI as above   Diabetes mellitus, II   Compensated systolic heart failure    Ischemic cardiomyopathy   History of paroxysmal A. fib in the past   Hypercholesteremia   History of DVT in the past   History of PE in the past   GERD   History of chronic abdominal pain   History of pancreatitis   Hypothyroidism   Constipation  Discharged Condition: fair  Hospital Course: 80 year old male with past medical history significant for coronary artery disease history of MI x 2 in the past status post PCI to LAD left circumflex and RCA in the past, ischemic cardiomyopathy, history of congestive heart failure secondary to systolic dysfunction, history of bilateral pulmonary embolism, history of paroxysmal A. fib, hypercholesteremia, non-insulin-dependent diabetes matters, history of DVT in the past, remote tobacco abuse, chronic kidney disease stage III, hypothyroidism, history of pancreatitis in the past, GERD, chronic abdominal pain, had  extensive GI workup in the past came to office complaining of recurrent retrosternal chest pain described as pressure, tightness radiating to left shoulder off and on for last few days. No sweating spell or shortness of breath. No fever or cough. He underwent nuclear stress test that was negative for reversible ischemia with 38 % EF.  He also has abdominal pain over left inguinal hernia area without vomiting but with nausea.He required few rounds of laxatives to overcome constipation and decrease abdominal discomfort. He was started on Miralax and discharged home with follow up by Dr. Sharyn Lull in 2 weeks.  Consults: cardiology  Significant Diagnostic Studies: CBC-near normal, BMET with mild hypokalemia, and creatinine of 1.67. INR-3.25. Normal Lipid panel and Troponin-I x 3. Radiology: KUB: Increased stool burden/Constipation and nuclear medicine: No reversible ischemia EF 38 %. EKG-SR + Left Bundle Branch Block. Chest X-ray: COPD/No acute finding. Echocardiogram with mild LVH, mild diastolic dysfunction with 50 % EF.  Treatments: cardiac meds: carvedilol, furosemide, Imdur, Losartan, Spironolactone, tamsulosin, Warfarin, amiodarone and Atorvastatin.  Discharge Exam: Blood pressure 134/57, pulse 54, temperature 98.1 F (36.7 C), temperature source Oral, resp. rate 18, height 5' 8.5" (1.74 m), weight 86.138 kg (189 lb 14.4 oz), SpO2 100 %. Physical Exam: HEENT: [name]/AT, Eyes-Brown, PERL, EOMI, Conjunctiva-Pink, Sclera-Non-icteric Neck: No JVD, No bruit, Trachea midline. Lungs: Clear, Bilateral. Cardiac: Regular rhythm, normal S1 and S2, no S3. II/VI systolic murmur. Abdomen: Soft, non-tender. Extremities: 1 + edema present. No cyanosis. No clubbing. CNS: AxOx3, Cranial nerves grossly intact, moves all 4 extremities. Right handed. Skin: Warm and dry.  Disposition: 01-Home or Self Care     Medication List    TAKE these medications  acetaminophen 500 MG tablet  Commonly known  as:  TYLENOL  Take 1,000 mg by mouth every 6 (six) hours as needed (pain).     amiodarone 200 MG tablet  Commonly known as:  PACERONE  Take 200 mg by mouth daily.     atorvastatin 40 MG tablet  Commonly known as:  LIPITOR  Take 40 mg by mouth daily.     carvedilol 3.125 MG tablet  Commonly known as:  COREG  Take 3.125 mg by mouth 2 (two) times daily with a meal. 8am, 6pm     cetirizine 10 MG tablet  Commonly known as:  ZYRTEC  Take 10 mg by mouth daily.     dorzolamide-timolol 22.3-6.8 MG/ML ophthalmic solution  Commonly known as:  COSOPT  Place 1 drop into the right eye 3 (three) times daily.     esomeprazole 40 MG capsule  Commonly known as:  NEXIUM  Take 40 mg by mouth daily.     fluticasone 50 MCG/ACT nasal spray  Commonly known as:  FLONASE  Place 1 spray into both nostrils 2 (two) times daily.     furosemide 40 MG tablet  Commonly known as:  LASIX  Take 40 mg by mouth daily.     HYDROcodone-acetaminophen 5-325 MG per tablet  Commonly known as:  NORCO/VICODIN  Take 2 tablets by mouth every 4 (four) hours as needed for moderate pain or severe pain.     isosorbide mononitrate 60 MG 24 hr tablet  Commonly known as:  IMDUR  Take 60 mg by mouth daily.     levothyroxine 50 MCG tablet  Commonly known as:  SYNTHROID, LEVOTHROID  Take 50 mcg by mouth daily before breakfast.     losartan 100 MG tablet  Commonly known as:  COZAAR  Take 100 mg by mouth daily.     nitroGLYCERIN 0.4 MG SL tablet  Commonly known as:  NITROSTAT  Place 0.4 mg under the tongue every 5 (five) minutes as needed for chest pain.     polyethylene glycol packet  Commonly known as:  MIRALAX / GLYCOLAX  Take 17 g by mouth daily.     promethazine 25 MG tablet  Commonly known as:  PHENERGAN  Take 1 tablet (25 mg total) by mouth every 6 (six) hours as needed for nausea or vomiting.     ranitidine 300 MG tablet  Commonly known as:  ZANTAC  Take 300 mg by mouth 2 (two) times daily.      spironolactone 25 MG tablet  Commonly known as:  ALDACTONE  Take 25 mg by mouth daily.     tamsulosin 0.4 MG Caps capsule  Commonly known as:  FLOMAX  Take 0.4 mg by mouth at bedtime.     TRADJENTA 5 MG Tabs tablet  Generic drug:  linagliptin  Take 5 mg by mouth daily.     warfarin 4 MG tablet  Commonly known as:  COUMADIN  Take 2-4 mg by mouth daily at 6 PM. Take 1/2 tablet (2 mg) on Sundays, take 1 tablet (4 mg) Monday through Saturday           Follow-up Information    [facility]   Why:  Registered Nurse, Physical Therapy, Aide and Child psychotherapistocial Worker.    Contact information:   [address] [place] [address] [phone]       Follow up with Robynn PaneHARWANI,MOHAN N, MD. Schedule an appointment as soon as possible for a visit in  2 weeks.   Specialty:  Cardiology   Contact information:   [address] [place] [address] [phone]       Signed: Ricki RodriguezKADAKIA,Brithney Bensen S 01/11/2014, 7:18 PM

## 2014-01-11 NOTE — Care Management Note (Signed)
    Page 1 of 2   01/11/2014     12:18:28 PM CARE MANAGEMENT NOTE 01/11/2014  [facility]:  [facility]   Account Number:  [id number]  Date Initiated:  01/11/2014  Documentation initiated by:  GRAVES-BIGELOW,Brynli Ollis  Subjective/Objective Assessment:   Pt admitted for cp- s/p stress test. Moderate constipation- had a BM. Pt is from home alone at a Senior Living Apartment- [place].     Action/Plan:   Pt states he had PCS however the person was taking his money. CM did ask if any family and none available. CM called pt's insurance to assist with Case Management from home. Pt will need to be [facility]-RN,PT, SW and aide.   Anticipated DC Date:  01/11/2014   Anticipated DC Plan:  HOME W HOME HEALTH SERVICES      DC Planning Services  CM consult      [facility]  [facility]   Choice offered to / List presented to:  C-1 Patient        HH arranged  HH-1 RN  HH-10 DISEASE MANAGEMENT  HH-2 PT  HH-4 NURSE'S AIDE  HH-6 SOCIAL WORKER      HH agency  [facility]   Status of service:  Completed, signed off Medicare Important Message given?  YES (If response is "NO", the following Medicare IM given date fields will be blank) Date Medicare IM given:  01/11/2014 Medicare IM given by:  GRAVES-BIGELOW,Will Heinkel Date Additional Medicare IM given:   Additional Medicare IM given by:    Discharge Disposition:  HOME W HOME HEALTH SERVICES  Per UR Regulation:  Reviewed for med. necessity/level of care/duration of stay  If discussed at Long Length of Stay Meetings, dates discussed:    Comments:  1148 01-11-14 Tomi BambergerBrenda Graves-Bigelow, RN,BSN [phone] CM did speak with representative Morrie Sheldonshley from [facility] and they have a Case Management Service: Baxter Flatterynovalon, however the pt will need to reach out to them. Contact Number is [phone]. CM will provide informaiton to pt and make the [facility] aware. Meals on Wheels/Transportation information to be provided to pt  by CSW. [facility] earliest visit will be Sat. Dec 26t.  Will continue to monitor.

## 2014-01-11 NOTE — Progress Notes (Signed)
Physical Therapy Treatment Patient Details Name: [name] MRN: [id number] DOB: Jul 19, 1929 Today's Date: 01/11/2014    History of Present Illness 80 year old male with past medical history significant for coronary artery disease history of MI x2 in the past  , ischemic cardiomyopathy, congestive heart failure, history of bilateral pulmonary embolism, paroxysmal A. fib, hypercholesteremia, non-insulin-dependent diabetes, chronic kidney disease stage III, hypothyroidism, GERD, chronic abdominal pain, had extensive GI workup in the past admitted with complaint of recurrent retrosternal chest pain described as pressure tightness radiating to left shoulder off and on for last few days    PT Comments    Pt pleasant and willing to mobilize. Pt able to clarify today that he has no assist at home, a lady in the neighborhood sometimes assists with errands and cleaning but pt cares for himself in an apt. Pt would benefit from ST-SNF, ultimately ALF. If pt denies would at least recommend HHPT and aide for home. Will continue to follow.    Follow Up Recommendations  Other (comment);Supervision for mobility/OOB;SNF (recommend Aide for home and ultimately ALF as pt at a senior apt currently)     Equipment Recommendations       Recommendations for Other Services       Precautions / Restrictions Precautions Precautions: Fall    Mobility  Bed Mobility Overal bed mobility: Modified Independent                Transfers Overall transfer level: Needs assistance   Transfers: Sit to/from Stand Sit to Stand: Supervision         General transfer comment: cues for hand placement  Ambulation/Gait Ambulation/Gait assistance: Supervision Ambulation Distance (Feet): 350 Feet (15' then 350') Assistive device: Rolling walker (2 wheeled) Gait Pattern/deviations: Step-through pattern;Decreased stride length;Trunk flexed   Gait velocity interpretation: Below normal speed for  age/gender General Gait Details: cues for posture, position in RW and directional cues   Stairs            Wheelchair Mobility    Modified Rankin (Stroke Patients Only)       Balance Overall balance assessment: Needs assistance   Sitting balance-Leahy Scale: Fair       Standing balance-Leahy Scale: Fair                      Cognition Arousal/Alertness: Awake/alert Behavior During Therapy: WFL for tasks assessed/performed Overall Cognitive Status: Within Functional Limits for tasks assessed                      Exercises      General Comments        Pertinent Vitals/Pain Pain Assessment: No/denies pain    Home Living                      Prior Function            PT Goals (current goals can now be found in the care plan section) Progress towards PT goals: Progressing toward goals    Frequency       PT Plan Discharge plan needs to be updated    Co-evaluation             End of Session   Activity Tolerance: Patient tolerated treatment well Patient left: in chair;with call bell/phone within reach     Time: [id number] PT Time Calculation (min) (ACUTE ONLY): 32 min  Charges:  $Gait Training: 8-22 mins $Therapeutic Activity: 8-22 mins  G CodesDelorse Lek:      Tabor, Reshma Hoey Beth 01/11/2014, 12:01 PM Delaney MeigsMaija Tabor Teva Bronkema, PT [phone]

## 2014-01-11 NOTE — Progress Notes (Signed)
CSW (Clinical Child psychotherapistocial Worker) notified by [facility] that pt will dc home but could use assistance with resources. CSW visited pt room and spoke with pt. Pt informed CSW he cooks for himself and is driving himself to appointments and to run errands. Pt reports having limited assistance. CSW provided pt with information on [employer] [place]. CSW explained programs available through [employer]. Pt informed CSW he was not interested in many of the programs includng meals on wheels program. CSW informed pt that was fine he could still take advantage of other options. Pt has phone number and information to set up transportation or other services he needs/wants. Pt has no further hospital social work needs. RNCM aware that pt could benefit from home health social worker. CSW signing off.  [name], LCSWA [phone]

## 2014-01-19 ENCOUNTER — Emergency Department (HOSPITAL_COMMUNITY)
Admission: EM | Admit: 2014-01-19 | Discharge: 2014-01-19 | Disposition: A | Discharge status: Home / Self Care | Payer: Medicare Other | Attending: Emergency Medicine | Admitting: Emergency Medicine

## 2014-01-19 ENCOUNTER — Encounter (HOSPITAL_COMMUNITY): Payer: Self-pay | Admitting: Family Medicine

## 2014-01-19 DIAGNOSIS — I480 Paroxysmal atrial fibrillation: Secondary | ICD-10-CM | POA: Diagnosis not present

## 2014-01-19 DIAGNOSIS — Z87891 Personal history of nicotine dependence: Secondary | ICD-10-CM | POA: Insufficient documentation

## 2014-01-19 DIAGNOSIS — Z79899 Other long term (current) drug therapy: Secondary | ICD-10-CM | POA: Insufficient documentation

## 2014-01-19 DIAGNOSIS — Z9861 Coronary angioplasty status: Secondary | ICD-10-CM | POA: Diagnosis not present

## 2014-01-19 DIAGNOSIS — Z86718 Personal history of other venous thrombosis and embolism: Secondary | ICD-10-CM | POA: Insufficient documentation

## 2014-01-19 DIAGNOSIS — I252 Old myocardial infarction: Secondary | ICD-10-CM | POA: Diagnosis not present

## 2014-01-19 DIAGNOSIS — M1389 Other specified arthritis, multiple sites: Secondary | ICD-10-CM | POA: Diagnosis not present

## 2014-01-19 DIAGNOSIS — Z8701 Personal history of pneumonia (recurrent): Secondary | ICD-10-CM | POA: Diagnosis not present

## 2014-01-19 DIAGNOSIS — I10 Essential (primary) hypertension: Secondary | ICD-10-CM | POA: Diagnosis not present

## 2014-01-19 DIAGNOSIS — Z86711 Personal history of pulmonary embolism: Secondary | ICD-10-CM | POA: Diagnosis not present

## 2014-01-19 DIAGNOSIS — H9190 Unspecified hearing loss, unspecified ear: Secondary | ICD-10-CM | POA: Diagnosis not present

## 2014-01-19 DIAGNOSIS — E119 Type 2 diabetes mellitus without complications: Secondary | ICD-10-CM | POA: Diagnosis not present

## 2014-01-19 DIAGNOSIS — J45909 Unspecified asthma, uncomplicated: Secondary | ICD-10-CM | POA: Insufficient documentation

## 2014-01-19 DIAGNOSIS — G8929 Other chronic pain: Secondary | ICD-10-CM | POA: Insufficient documentation

## 2014-01-19 DIAGNOSIS — E039 Hypothyroidism, unspecified: Secondary | ICD-10-CM | POA: Diagnosis not present

## 2014-01-19 DIAGNOSIS — Z8673 Personal history of transient ischemic attack (TIA), and cerebral infarction without residual deficits: Secondary | ICD-10-CM | POA: Insufficient documentation

## 2014-01-19 DIAGNOSIS — N400 Enlarged prostate without lower urinary tract symptoms: Secondary | ICD-10-CM | POA: Insufficient documentation

## 2014-01-19 DIAGNOSIS — Z9889 Other specified postprocedural states: Secondary | ICD-10-CM | POA: Diagnosis not present

## 2014-01-19 DIAGNOSIS — E785 Hyperlipidemia, unspecified: Secondary | ICD-10-CM | POA: Insufficient documentation

## 2014-01-19 DIAGNOSIS — K5900 Constipation, unspecified: Secondary | ICD-10-CM | POA: Diagnosis not present

## 2014-01-19 DIAGNOSIS — I209 Angina pectoris, unspecified: Secondary | ICD-10-CM | POA: Insufficient documentation

## 2014-01-19 DIAGNOSIS — K219 Gastro-esophageal reflux disease without esophagitis: Secondary | ICD-10-CM | POA: Diagnosis not present

## 2014-01-19 DIAGNOSIS — Z8659 Personal history of other mental and behavioral disorders: Secondary | ICD-10-CM | POA: Insufficient documentation

## 2014-01-19 DIAGNOSIS — H409 Unspecified glaucoma: Secondary | ICD-10-CM | POA: Insufficient documentation

## 2014-01-19 DIAGNOSIS — Z7951 Long term (current) use of inhaled steroids: Secondary | ICD-10-CM | POA: Diagnosis not present

## 2014-01-19 DIAGNOSIS — R109 Unspecified abdominal pain: Secondary | ICD-10-CM

## 2014-01-19 DIAGNOSIS — R3989 Other symptoms and signs involving the genitourinary system: Secondary | ICD-10-CM

## 2014-01-19 DIAGNOSIS — Z7901 Long term (current) use of anticoagulants: Secondary | ICD-10-CM | POA: Insufficient documentation

## 2014-01-19 LAB — COMPREHENSIVE METABOLIC PANEL
ALBUMIN: 3.4 g/dL — AB (ref 3.5–5.2)
ALT: 22 U/L (ref 0–53)
ANION GAP: 7 (ref 5–15)
AST: 22 U/L (ref 0–37)
Alkaline Phosphatase: 75 U/L (ref 39–117)
BILIRUBIN TOTAL: 0.4 mg/dL (ref 0.3–1.2)
BUN: 22 mg/dL (ref 6–23)
CO2: 25 mmol/L (ref 19–32)
Calcium: 9.2 mg/dL (ref 8.4–10.5)
Chloride: 109 mEq/L (ref 96–112)
Creatinine, Ser: 1.9 mg/dL — ABNORMAL HIGH (ref 0.50–1.35)
GFR calc Af Amer: 36 mL/min — ABNORMAL LOW (ref >90)
GFR calc non Af Amer: 31 mL/min — ABNORMAL LOW (ref >90)
Glucose, Bld: 107 mg/dL — ABNORMAL HIGH (ref 70–99)
Potassium: 3.7 mmol/L (ref 3.5–5.1)
Sodium: 141 mmol/L (ref 135–145)
TOTAL PROTEIN: 6.7 g/dL (ref 6.0–8.3)

## 2014-01-19 LAB — CBC WITH DIFFERENTIAL/PLATELET
BASOS PCT: 0 % (ref 0–1)
Basophils Absolute: 0 10*3/uL (ref 0.0–0.1)
EOS ABS: 0.1 10*3/uL (ref 0.0–0.7)
Eosinophils Relative: 1 % (ref 0–5)
HEMATOCRIT: 35.8 % — AB (ref 39.0–52.0)
Hemoglobin: 12.2 g/dL — ABNORMAL LOW (ref 13.0–17.0)
Lymphocytes Relative: 37 % (ref 12–46)
Lymphs Abs: 2.3 10*3/uL (ref 0.7–4.0)
MCH: 27.9 pg (ref 26.0–34.0)
MCHC: 34.1 g/dL (ref 30.0–36.0)
MCV: 81.9 fL (ref 78.0–100.0)
MONO ABS: 0.6 10*3/uL (ref 0.1–1.0)
MONOS PCT: 10 % (ref 3–12)
Neutro Abs: 3.2 10*3/uL (ref 1.7–7.7)
Neutrophils Relative %: 51 % (ref 43–77)
Platelets: 118 10*3/uL — ABNORMAL LOW (ref 150–400)
RBC: 4.37 MIL/uL (ref 4.22–5.81)
RDW: 15.1 % (ref 11.5–15.5)
WBC: 6.2 10*3/uL (ref 4.0–10.5)

## 2014-01-19 LAB — URINALYSIS, ROUTINE W REFLEX MICROSCOPIC
BILIRUBIN URINE: NEGATIVE
Glucose, UA: NEGATIVE mg/dL
Hgb urine dipstick: NEGATIVE
KETONES UR: NEGATIVE mg/dL
Leukocytes, UA: NEGATIVE
NITRITE: NEGATIVE
Protein, ur: NEGATIVE mg/dL
SPECIFIC GRAVITY, URINE: 1.014 (ref 1.005–1.030)
UROBILINOGEN UA: 0.2 mg/dL (ref 0.0–1.0)
pH: 6 (ref 5.0–8.0)

## 2014-01-19 NOTE — Discharge Instructions (Signed)
° °  It is important that you follow-up, with 2 different specialist. You will need to see a gastroenterologist about your stooling abnormality. You'll need to see a urologist about possible prostate cancer. Use the numbers that you have been provided today, to call and schedule appointments.    Abdominal Pain Many things can cause abdominal pain. Usually, abdominal pain is not caused by a disease and will improve without treatment. It can often be observed and treated at home. Your health care provider will do a physical exam and possibly order blood tests and X-rays to help determine the seriousness of your pain. However, in many cases, more time must pass before a clear cause of the pain can be found. Before that point, your health care provider may not know if you need more testing or further treatment. HOME CARE INSTRUCTIONS  Monitor your abdominal pain for any changes. The following actions may help to alleviate any discomfort you are experiencing:  Only take over-the-counter or prescription medicines as directed by your health care provider.  Do not take laxatives unless directed to do so by your health care provider.  Try a clear liquid diet (broth, tea, or water) as directed by your health care provider. Slowly move to a bland diet as tolerated. SEEK MEDICAL CARE IF:  You have unexplained abdominal pain.  You have abdominal pain associated with nausea or diarrhea.  You have pain when you urinate or have a bowel movement.  You experience abdominal pain that wakes you in the night.  You have abdominal pain that is worsened or improved by eating food.  You have abdominal pain that is worsened with eating fatty foods.  You have a fever. SEEK IMMEDIATE MEDICAL CARE IF:   Your pain does not go away within 2 hours.  You keep throwing up (vomiting).  Your pain is felt only in portions of the abdomen, such as the right side or the left lower portion of the abdomen.  You pass  bloody or black tarry stools. MAKE SURE YOU:  Understand these instructions.   Will watch your condition.   Will get help right away if you are not doing well or get worse.  Document Released: 10/18/2004 Document Revised: 01/13/2013 Document Reviewed: 09/17/2012 [facility] Patient Information 2015 [employer] [employer]. This information is not intended to replace advice given to you by your health care provider. Make sure you discuss any questions you have with your health care provider.

## 2014-01-19 NOTE — ED Notes (Signed)
Pt presents from home via [facility] with c/o RLQ pain x1year that became severe today. Pt reports had right hernia repair approx 1 year ago.  PT endorses nausea.  Pt in NAD.

## 2014-01-19 NOTE — ED Provider Notes (Signed)
CSN: [id number]     Arrival date & time 01/19/14  1332 History   First MD Initiated Contact with Patient 01/19/14 1347     Chief Complaint  Patient presents with  . Abdominal Pain     (Consider location/radiation/quality/duration/timing/severity/associated sxs/prior Treatment) HPI   [name] is a 80 y.o. male who is here for evaluation of abdominal pain, narrow caliber stool, and concern for left groin hernia.  He has chronic intermittent abdominal pain for several years.  He has been evaluated by GI, Dr. Elnoria Howard, recommended to use MiraLAX for constipation and reportedly told the patient that there was nothing he could do further.  He's been told in the past that he had a hernia on the left.  He had a right groin hernia repaired as well.  He was recently admitted to his primary care service for chest pain and abdominal pain.  His abdominal pain was evaluated at that time with plain images of the abdomen which showed only constipation.  The patient is able to eat, take his medicine and is not having vomiting.  He denies fever, cough, chest pain, weakness or dizziness.  He came by EMS for evaluation.  He does not know if he ever has seen a urologist.  There are no other known modifying factors.   Past Medical History  Diagnosis Date  . GERD (gastroesophageal reflux disease)   . Chronic back pain   . Gout     no problems in 2 years  . Pulmonary embolism, bilateral 2010  . Hyperlipemia   . Anginal pain   . DVT, lower extremity 1980's    LLE  . Asthma     "touch"  . Pneumonia 1970's  . Arthritis     "mostly in my knees and shoulders"  . Collapsed lung     "for 14 years" right   . Diabetes mellitus   . Hypertension   . Depression   . Anxiety   . Hearing loss   . Glaucoma   . Complication of anesthesia   . PONV (postoperative nausea and vomiting)   . Renal disorder ~ 03/2011    "wasn't functioning right"  . Stroke   . Stroke     "just a little one"  . Hypothyroidism   .  Chronic abdominal pain   . Paroxysmal atrial fibrillation   . Myocardial infarction 1987; 1989   Past Surgical History  Procedure Laterality Date  . Esophagogastroduodenoscopy  12/22/2010    Procedure: ESOPHAGOGASTRODUODENOSCOPY (EGD);  Surgeon: Theda Belfast;  Location: [facility];  Service: Endoscopy;  Laterality: N/A;  . Appendectomy  1950  . Cataract extraction w/ intraocular lens  implant, bilateral Bilateral 1990's  . Coronary angioplasty with stent placement      "think I have 4 total"  . Inguinal hernia repair Bilateral 03/28/2012    Procedure: LAPAROSCOPIC BILATERAL INGUINAL HERNIA REPAIR;  Surgeon: Ardeth Sportsman, MD;  Location: [facility];  Service: General;  Laterality: Bilateral;  . Insertion of mesh Bilateral 03/28/2012    Procedure: INSERTION OF MESH;  Surgeon: Ardeth Sportsman, MD;  Location: [facility];  Service: General;  Laterality: Bilateral;  . Hernia repair    . Esophagogastroduodenoscopy N/A 04/30/2012    Procedure: ESOPHAGOGASTRODUODENOSCOPY (EGD);  Surgeon: Theda Belfast, MD;  Location: [facility] ENDOSCOPY;  Service: Endoscopy;  Laterality: N/A;  . Colonoscopy N/A 05/01/2012    Procedure: COLONOSCOPY;  Surgeon: Theda Belfast, MD;  Location: [facility] ENDOSCOPY;  Service: Endoscopy;  Laterality: N/A;  .  Cardiac catheterization    . Left heart catheterization with coronary angiogram N/A 02/19/2011    Procedure: LEFT HEART CATHETERIZATION WITH CORONARY ANGIOGRAM;  Surgeon: Robynn Pane, MD;  Location: [facility] CATH LAB;  Service: Cardiovascular;  Laterality: N/A;   Family History  Problem Relation Age of Onset  . Diabetes Mother   . Heart disease Father   . Lung cancer Brother   . Stroke Neg Hx   . Hypertension Neg Hx   . Hyperlipidemia Neg Hx   . Alcohol abuse Neg Hx    History  Substance Use Topics  . Smoking status: Former Smoker -- 2.00 packs/day for 3 years    Types: Cigarettes    Start date: 01/27/1946    Quit date: 01/22/1981  . Smokeless tobacco: Never Used  . Alcohol Use:  No     Comment: 07/12/2011; "last drink of alcohol was ~ 1983"    Review of Systems  All other systems reviewed and are negative.     Allergies  Review of patient's allergies indicates no known allergies.  Home Medications   Prior to Admission medications   Medication Sig Start Date End Date Taking? Authorizing Provider  acetaminophen (TYLENOL) 500 MG tablet Take 1,000 mg by mouth every 6 (six) hours as needed (pain).    Yes Historical Provider, MD  amiodarone (PACERONE) 200 MG tablet Take 100 mg by mouth daily.    Yes Historical Provider, MD  atorvastatin (LIPITOR) 40 MG tablet Take 40 mg by mouth daily.   Yes Historical Provider, MD  carvedilol (COREG) 3.125 MG tablet Take 3.125 mg by mouth 2 (two) times daily with a meal. 8am, 6pm   Yes Historical Provider, MD  cetirizine (ZYRTEC) 10 MG tablet Take 10 mg by mouth daily.   Yes Historical Provider, MD  dorzolamide-timolol (COSOPT) 22.3-6.8 MG/ML ophthalmic solution Place 1 drop into the right eye 3 (three) times daily.   Yes Historical Provider, MD  esomeprazole (NEXIUM) 40 MG capsule Take 40 mg by mouth daily.    Yes Historical Provider, MD  fluticasone (FLONASE) 50 MCG/ACT nasal spray Place 1 spray into both nostrils 2 (two) times daily.  08/13/13  Yes Historical Provider, MD  furosemide (LASIX) 40 MG tablet Take 40 mg by mouth daily.    Yes Historical Provider, MD  isosorbide mononitrate (IMDUR) 60 MG 24 hr tablet Take 60 mg by mouth daily.   Yes Historical Provider, MD  levothyroxine (SYNTHROID, LEVOTHROID) 50 MCG tablet Take 50 mcg by mouth daily before breakfast.    Yes Historical Provider, MD  linagliptin (TRADJENTA) 5 MG TABS tablet Take 5 mg by mouth daily.   Yes Historical Provider, MD  losartan (COZAAR) 100 MG tablet Take 100 mg by mouth daily.   Yes Historical Provider, MD  nitroGLYCERIN (NITROSTAT) 0.4 MG SL tablet Place 0.4 mg under the tongue every 5 (five) minutes as needed for chest pain.   Yes Historical Provider, MD   polyethylene glycol (MIRALAX / GLYCOLAX) packet Take 17 g by mouth daily. 01/11/14  Yes Ricki Rodriguez, MD  ranitidine (ZANTAC) 300 MG tablet Take 300 mg by mouth 2 (two) times daily.  01/04/14  Yes Historical Provider, MD  spironolactone (ALDACTONE) 25 MG tablet Take 25 mg by mouth daily.   Yes Historical Provider, MD  tamsulosin (FLOMAX) 0.4 MG CAPS capsule Take 0.4 mg by mouth at bedtime.    Yes Historical Provider, MD  warfarin (COUMADIN) 4 MG tablet Take 2-4 mg by mouth daily at 6 PM. Take  1/2 tablet (2 mg) on Sundays, take 1 tablet (4 mg) Monday through Saturday   Yes Historical Provider, MD  HYDROcodone-acetaminophen (NORCO/VICODIN) 5-325 MG per tablet Take 2 tablets by mouth every 4 (four) hours as needed for moderate pain or severe pain. Patient not taking: Reported on 01/19/2014 08/19/13   Clement SayresStaci Shepard, MD  promethazine (PHENERGAN) 25 MG tablet Take 1 tablet (25 mg total) by mouth every 6 (six) hours as needed for nausea or vomiting. Patient not taking: Reported on 01/19/2014 08/20/13   Benetta SparVictoria L Creech, PA-C   BP 186/90 mmHg  Pulse 67  Temp(Src) 97.9 F (36.6 C) (Oral)  Resp 14  SpO2 98% Physical Exam  Constitutional: He is oriented to person, place, and time. He appears well-developed.  Elderly, frail  HENT:  Head: Normocephalic and atraumatic.  Right Ear: External ear normal.  Left Ear: External ear normal.  Eyes: Conjunctivae and EOM are normal. Pupils are equal, round, and reactive to light.  Neck: Normal range of motion and phonation normal. Neck supple.  Cardiovascular: Normal rate, regular rhythm and normal heart sounds.   Pulmonary/Chest: Effort normal and breath sounds normal. He exhibits no bony tenderness.  Abdominal: Soft. There is no tenderness.  Genitourinary:  Anus, normal sphincter tone.  Brown stool in rectal vault.  No rectal mass.  Prostate is enlarged, firm and has a nodular structure.  The prostate is nontender to palpation.  Musculoskeletal: Normal  range of motion.  Neurological: He is alert and oriented to person, place, and time. No cranial nerve deficit or sensory deficit. He exhibits normal muscle tone. Coordination normal.  Skin: Skin is warm, dry and intact.  Psychiatric: He has a normal mood and affect. His behavior is normal. Judgment and thought content normal.  Nursing note and vitals reviewed.   ED Course  Procedures (including critical care time)  Medications - No data to display  Patient Vitals for the past 24 hrs:  BP Temp Temp src Pulse Resp SpO2  01/19/14 1539 186/90 mmHg - - 67 14 98 %  01/19/14 1530 186/90 mmHg - - 61 15 99 %  01/19/14 1515 159/78 mmHg - - 62 18 100 %  01/19/14 1500 162/83 mmHg - - (!) 59 16 99 %  01/19/14 1445 151/74 mmHg - - (!) 58 14 100 %  01/19/14 1415 161/81 mmHg - - (!) 58 17 96 %  01/19/14 1345 167/72 mmHg - - (!) 58 17 97 %  01/19/14 1338 160/76 mmHg 97.9 F (36.6 C) Oral 65 15 99 %  01/19/14 1332 - - - - - 97 %    3:39 PM Reevaluation with update and discussion. After initial assessment and treatment, an updated evaluation reveals; no additional complaints. findings discussed with patient, all questions answered. Dontee Jaso L    Labs Review Labs Reviewed  CBC WITH DIFFERENTIAL - Abnormal; Notable for the following:    Hemoglobin 12.2 (*)    HCT 35.8 (*)    Platelets 118 (*)    All other components within normal limits  COMPREHENSIVE METABOLIC PANEL - Abnormal; Notable for the following:    Glucose, Bld 107 (*)    Creatinine, Ser 1.90 (*)    Albumin 3.4 (*)    GFR calc non Af Amer 31 (*)    GFR calc Af Amer 36 (*)    All other components within normal limits  URINALYSIS, ROUTINE W REFLEX MICROSCOPIC    Imaging Review No results found.   EKG Interpretation None  MDM   Final diagnoses:  Chronic abdominal pain  Constipation, unspecified constipation type  Abnormal prostate exam    Nonspecific recurrent abdominal pain, with apparent chronic  constipation.  Also concern for change in stool caliber.  No apparent blood on rectal examination today.  Changes in stool caliber, should be evaluated with colonoscopy.  He will be referred to a new endoscopist.  There is no apparent groin hernia, today.  There are no symptoms of bowel obstruction.  Prostate examination is abnormal.  Prior records and CT reviewed; in July 2015, he had a possible prostate cancer, on CT imaging.  Is not clear if this was ever evaluated.  I doubt serious bacterial infection, metabolic instability or impending vascular collapse.  Nursing Notes Reviewed/ Care Coordinated Applicable Imaging Reviewed Interpretation of Laboratory Data incorporated into ED treatment  The patient appears reasonably screened and/or stabilized for discharge and I doubt any other medical condition or other [facility] requiring further screening, evaluation, or treatment in the ED at this time prior to discharge.  Plan: Home Medications- usual; Home Treatments- rest, fluids, fiber; return here if the recommended treatment, does not improve the symptoms; Recommended follow up- GI for stooling abnormality, and Urology for possible Prostate cancer; both asap.  Flint MelterElliott L Aliea Bobe, MD 01/19/14 1710

## 2014-01-19 NOTE — ED Notes (Signed)
PT verbalizes understanding rt d/c instructions. Pt is stable upon d/c and is escorted from ED via wheelchair by this RN.

## 2014-02-01 NOTE — Progress Notes (Signed)
Late entry for missed G-code. Based on review of the evaluation and goals by Narda AmberJennifer [name], PT.   01/10/14 1342  PT G-Codes **NOT FOR INPATIENT CLASS**  Functional Assessment Tool Used Clinical judgement based on chart review  Functional Limitation Mobility: Walking and moving around  Mobility: Walking and Moving Around Current Status ([id number]) CI  Mobility: Walking and Moving Around Goal Status [phone](G8979) CI  Lavona MoundMark Niaja Stickley, [place]  [phone] 02/01/2014

## 2014-03-26 ENCOUNTER — Emergency Department (HOSPITAL_COMMUNITY)
Admission: EM | Admit: 2014-03-26 | Discharge: 2014-03-27 | Disposition: A | Discharge status: Home / Self Care | Payer: Medicare Other | Attending: Emergency Medicine | Admitting: Emergency Medicine

## 2014-03-26 ENCOUNTER — Encounter (HOSPITAL_COMMUNITY): Payer: Self-pay | Admitting: Emergency Medicine

## 2014-03-26 ENCOUNTER — Emergency Department (HOSPITAL_COMMUNITY): Payer: Medicare Other

## 2014-03-26 DIAGNOSIS — Z87448 Personal history of other diseases of urinary system: Secondary | ICD-10-CM | POA: Diagnosis not present

## 2014-03-26 DIAGNOSIS — Z86711 Personal history of pulmonary embolism: Secondary | ICD-10-CM | POA: Diagnosis not present

## 2014-03-26 DIAGNOSIS — R112 Nausea with vomiting, unspecified: Secondary | ICD-10-CM | POA: Insufficient documentation

## 2014-03-26 DIAGNOSIS — Z8673 Personal history of transient ischemic attack (TIA), and cerebral infarction without residual deficits: Secondary | ICD-10-CM | POA: Insufficient documentation

## 2014-03-26 DIAGNOSIS — G8929 Other chronic pain: Secondary | ICD-10-CM | POA: Insufficient documentation

## 2014-03-26 DIAGNOSIS — J45909 Unspecified asthma, uncomplicated: Secondary | ICD-10-CM | POA: Insufficient documentation

## 2014-03-26 DIAGNOSIS — E039 Hypothyroidism, unspecified: Secondary | ICD-10-CM | POA: Insufficient documentation

## 2014-03-26 DIAGNOSIS — I252 Old myocardial infarction: Secondary | ICD-10-CM | POA: Insufficient documentation

## 2014-03-26 DIAGNOSIS — R1110 Vomiting, unspecified: Secondary | ICD-10-CM

## 2014-03-26 DIAGNOSIS — Z79899 Other long term (current) drug therapy: Secondary | ICD-10-CM | POA: Diagnosis not present

## 2014-03-26 DIAGNOSIS — Z7901 Long term (current) use of anticoagulants: Secondary | ICD-10-CM | POA: Insufficient documentation

## 2014-03-26 DIAGNOSIS — K219 Gastro-esophageal reflux disease without esophagitis: Secondary | ICD-10-CM | POA: Diagnosis not present

## 2014-03-26 DIAGNOSIS — M1990 Unspecified osteoarthritis, unspecified site: Secondary | ICD-10-CM | POA: Diagnosis not present

## 2014-03-26 DIAGNOSIS — Z8701 Personal history of pneumonia (recurrent): Secondary | ICD-10-CM | POA: Insufficient documentation

## 2014-03-26 DIAGNOSIS — R079 Chest pain, unspecified: Secondary | ICD-10-CM | POA: Diagnosis not present

## 2014-03-26 DIAGNOSIS — Z8659 Personal history of other mental and behavioral disorders: Secondary | ICD-10-CM | POA: Insufficient documentation

## 2014-03-26 DIAGNOSIS — Z86718 Personal history of other venous thrombosis and embolism: Secondary | ICD-10-CM | POA: Diagnosis not present

## 2014-03-26 DIAGNOSIS — R1031 Right lower quadrant pain: Secondary | ICD-10-CM | POA: Diagnosis present

## 2014-03-26 DIAGNOSIS — I480 Paroxysmal atrial fibrillation: Secondary | ICD-10-CM | POA: Insufficient documentation

## 2014-03-26 DIAGNOSIS — Z7951 Long term (current) use of inhaled steroids: Secondary | ICD-10-CM | POA: Insufficient documentation

## 2014-03-26 DIAGNOSIS — I209 Angina pectoris, unspecified: Secondary | ICD-10-CM | POA: Diagnosis not present

## 2014-03-26 DIAGNOSIS — Z87891 Personal history of nicotine dependence: Secondary | ICD-10-CM | POA: Insufficient documentation

## 2014-03-26 LAB — COMPREHENSIVE METABOLIC PANEL
ALK PHOS: 55 U/L (ref 39–117)
ALT: 17 U/L (ref 0–53)
AST: 24 U/L (ref 0–37)
Albumin: 3.4 g/dL — ABNORMAL LOW (ref 3.5–5.2)
Anion gap: 7 (ref 5–15)
BUN: 14 mg/dL (ref 6–23)
CALCIUM: 8.8 mg/dL (ref 8.4–10.5)
CO2: 23 mmol/L (ref 19–32)
Chloride: 112 mmol/L (ref 96–112)
Creatinine, Ser: 1.72 mg/dL — ABNORMAL HIGH (ref 0.50–1.35)
GFR, EST AFRICAN AMERICAN: 41 mL/min — AB (ref >90)
GFR, EST NON AFRICAN AMERICAN: 35 mL/min — AB (ref >90)
GLUCOSE: 103 mg/dL — AB (ref 70–99)
Potassium: 4 mmol/L (ref 3.5–5.1)
SODIUM: 142 mmol/L (ref 135–145)
TOTAL PROTEIN: 6.4 g/dL (ref 6.0–8.3)
Total Bilirubin: 0.6 mg/dL (ref 0.3–1.2)

## 2014-03-26 LAB — CBC WITH DIFFERENTIAL/PLATELET
Basophils Absolute: 0 10*3/uL (ref 0.0–0.1)
Basophils Relative: 0 % (ref 0–1)
EOS ABS: 0.1 10*3/uL (ref 0.0–0.7)
EOS PCT: 1 % (ref 0–5)
HEMATOCRIT: 35.9 % — AB (ref 39.0–52.0)
HEMOGLOBIN: 12.3 g/dL — AB (ref 13.0–17.0)
LYMPHS ABS: 2.1 10*3/uL (ref 0.7–4.0)
Lymphocytes Relative: 36 % (ref 12–46)
MCH: 28.3 pg (ref 26.0–34.0)
MCHC: 34.3 g/dL (ref 30.0–36.0)
MCV: 82.7 fL (ref 78.0–100.0)
MONO ABS: 0.7 10*3/uL (ref 0.1–1.0)
MONOS PCT: 11 % (ref 3–12)
Neutro Abs: 3 10*3/uL (ref 1.7–7.7)
Neutrophils Relative %: 51 % (ref 43–77)
Platelets: 126 10*3/uL — ABNORMAL LOW (ref 150–400)
RBC: 4.34 MIL/uL (ref 4.22–5.81)
RDW: 16.3 % — AB (ref 11.5–15.5)
WBC: 5.8 10*3/uL (ref 4.0–10.5)

## 2014-03-26 LAB — PROTIME-INR
INR: 2.63 — AB (ref 0.00–1.49)
Prothrombin Time: 28.3 seconds — ABNORMAL HIGH (ref 11.6–15.2)

## 2014-03-26 LAB — TROPONIN I: Troponin I: <0.03 ng/mL (ref <0.031)

## 2014-03-26 LAB — LIPASE, BLOOD: LIPASE: 34 U/L (ref 11–59)

## 2014-03-26 MED ORDER — SODIUM CHLORIDE 0.9 % IV BOLUS (SEPSIS)
500.0000 mL | Freq: Once | INTRAVENOUS | Status: AC
  Administered 2014-03-26: 500 mL via INTRAVENOUS

## 2014-03-26 MED ORDER — ONDANSETRON HCL 4 MG/2ML IJ SOLN
4.0000 mg | Freq: Once | INTRAMUSCULAR | Status: AC
  Administered 2014-03-26: 4 mg via INTRAVENOUS
  Filled 2014-03-26: qty 2

## 2014-03-26 MED ORDER — IOHEXOL 300 MG/ML  SOLN: 25.0000 mL | Freq: Once | INTRAMUSCULAR | Status: AC | PRN

## 2014-03-26 MED ORDER — FENTANYL CITRATE 0.05 MG/ML IJ SOLN
50.0000 ug | Freq: Once | INTRAMUSCULAR | Status: AC
  Administered 2014-03-26: 50 ug via INTRAVENOUS
  Filled 2014-03-26: qty 2

## 2014-03-26 MED ORDER — IOHEXOL 300 MG/ML  SOLN
80.0000 mL | Freq: Once | INTRAMUSCULAR | Status: AC | PRN
  Administered 2014-03-26: 80 mL via INTRAVENOUS

## 2014-03-26 NOTE — ED Provider Notes (Signed)
CSN: [id number]     Arrival date & time 03/26/14  2049 History   First MD Initiated Contact with Patient 03/26/14 2055     Chief Complaint  Patient presents with  . Nausea     (Consider location/radiation/quality/duration/timing/severity/associated sxs/prior Treatment) Patient is a 81 y.o. male presenting with abdominal pain. The history is provided by the patient.  Abdominal Pain Pain location:  RLQ Pain quality: aching   Pain radiates to:  Does not radiate Pain severity:  Mild Onset quality:  Gradual Timing:  Constant Progression:  Worsening Chronicity:  Chronic Context comment:  At rest Relieved by:  Nothing Worsened by:  Nothing tried Ineffective treatments:  None tried Associated symptoms: chest pain, nausea and vomiting   Associated symptoms: no cough, no dysuria, no fever, no hematuria and no shortness of breath     Past Medical History  Diagnosis Date  . GERD (gastroesophageal reflux disease)   . Chronic back pain   . Gout     no problems in 2 years  . Pulmonary embolism, bilateral 2010  . Hyperlipemia   . Anginal pain   . DVT, lower extremity 1980's    LLE  . Asthma     "touch"  . Pneumonia 1970's  . Arthritis     "mostly in my knees and shoulders"  . Collapsed lung     "for 14 years" right   . Diabetes mellitus   . Hypertension   . Depression   . Anxiety   . Hearing loss   . Glaucoma   . Complication of anesthesia   . PONV (postoperative nausea and vomiting)   . Renal disorder ~ 03/2011    "wasn't functioning right"  . Stroke   . Stroke     "just a little one"  . Hypothyroidism   . Chronic abdominal pain   . Paroxysmal atrial fibrillation   . Myocardial infarction 1987; 1989   Past Surgical History  Procedure Laterality Date  . Esophagogastroduodenoscopy  12/22/2010    Procedure: ESOPHAGOGASTRODUODENOSCOPY (EGD);  Surgeon: Theda Belfast;  Location: [facility] ENDOSCOPY;  Service: Endoscopy;  Laterality: N/A;  . Appendectomy  1950  . Cataract  extraction w/ intraocular lens  implant, bilateral Bilateral 1990's  . Coronary angioplasty with stent placement      "think I have 4 total"  . Inguinal hernia repair Bilateral 03/28/2012    Procedure: LAPAROSCOPIC BILATERAL INGUINAL HERNIA REPAIR;  Surgeon: Ardeth Sportsman, MD;  Location: [facility] ORS;  Service: General;  Laterality: Bilateral;  . Insertion of mesh Bilateral 03/28/2012    Procedure: INSERTION OF MESH;  Surgeon: Ardeth Sportsman, MD;  Location: [facility] OR[facility];  Service: General;  Laterality: Bilateral;  . Hernia repair    . Esophagogastroduodenoscopy N/A 04/30/2012    Procedure: ESOPHAGOGASTRODUODENOSCOPY (EGD);  Surgeon: Theda Belfast, MD;  Location: [facility] ENDOSCOPY;  Service: Endoscopy;  Laterality: N/A;  . Colonoscopy N/A 05/01/2012    Procedure: COLONOSCOPY;  Surgeon: Theda Belfast, MD;  Location: [facility] ENDOSCOPY;  Service: Endoscopy;  Laterality: N/A;  . Cardiac catheterization    . Left heart catheterization with coronary angiogram N/A 02/19/2011    Procedure: LEFT HEART CATHETERIZATION WITH CORONARY ANGIOGRAM;  Surgeon: Robynn Pane, MD;  Location: [facility] CATH LAB;  Service: Cardiovascular;  Laterality: N/A;   Family History  Problem Relation Age of Onset  . Diabetes Mother   . Heart disease Father   . Lung cancer Brother   . Stroke Neg Hx   . Hypertension Neg Hx   .  Hyperlipidemia Neg Hx   . Alcohol abuse Neg Hx    History  Substance Use Topics  . Smoking status: Former Smoker -- 2.00 packs/day for 3 years    Types: Cigarettes    Start date: 01/27/1946    Quit date: 01/22/1981  . Smokeless tobacco: Never Used  . Alcohol Use: No     Comment: 07/12/2011; "last drink of alcohol was ~ 1983"    Review of Systems  Constitutional: Negative for fever.  HENT: Negative for drooling and rhinorrhea.   Eyes: Negative for pain.  Respiratory: Negative for cough and shortness of breath.   Cardiovascular: Positive for chest pain. Negative for leg swelling.  Gastrointestinal: Positive for  nausea, vomiting and abdominal pain.  Genitourinary: Negative for dysuria and hematuria.  Musculoskeletal: Negative for gait problem and neck pain.  Skin: Negative for color change.  Neurological: Negative for numbness and headaches.  Hematological: Negative for adenopathy.  Psychiatric/Behavioral: Negative for behavioral problems.  All other systems reviewed and are negative.     Allergies  Review of patient's allergies indicates no known allergies.  Home Medications   Prior to Admission medications   Medication Sig Start Date End Date Taking? Authorizing Provider  acetaminophen (TYLENOL) 500 MG tablet Take 1,000 mg by mouth every 6 (six) hours as needed (pain).     Historical Provider, MD  amiodarone (PACERONE) 200 MG tablet Take 100 mg by mouth daily.     Historical Provider, MD  atorvastatin (LIPITOR) 40 MG tablet Take 40 mg by mouth daily.    Historical Provider, MD  carvedilol (COREG) 3.125 MG tablet Take 3.125 mg by mouth 2 (two) times daily with a meal. 8am, 6pm    Historical Provider, MD  cetirizine (ZYRTEC) 10 MG tablet Take 10 mg by mouth daily.    Historical Provider, MD  dorzolamide-timolol (COSOPT) 22.3-6.8 MG/ML ophthalmic solution Place 1 drop into the right eye 3 (three) times daily.    Historical Provider, MD  esomeprazole (NEXIUM) 40 MG capsule Take 40 mg by mouth daily.     Historical Provider, MD  fluticasone (FLONASE) 50 MCG/ACT nasal spray Place 1 spray into both nostrils 2 (two) times daily.  08/13/13   Historical Provider, MD  furosemide (LASIX) 40 MG tablet Take 40 mg by mouth daily.     Historical Provider, MD  HYDROcodone-acetaminophen (NORCO/VICODIN) 5-325 MG per tablet Take 2 tablets by mouth every 4 (four) hours as needed for moderate pain or severe pain. Patient not taking: Reported on 01/19/2014 08/19/13   Clement Sayres, MD  isosorbide mononitrate (IMDUR) 60 MG 24 hr tablet Take 60 mg by mouth daily.    Historical Provider, MD  levothyroxine (SYNTHROID,  LEVOTHROID) 50 MCG tablet Take 50 mcg by mouth daily before breakfast.     Historical Provider, MD  linagliptin (TRADJENTA) 5 MG TABS tablet Take 5 mg by mouth daily.    Historical Provider, MD  losartan (COZAAR) 100 MG tablet Take 100 mg by mouth daily.    Historical Provider, MD  nitroGLYCERIN (NITROSTAT) 0.4 MG SL tablet Place 0.4 mg under the tongue every 5 (five) minutes as needed for chest pain.    Historical Provider, MD  polyethylene glycol (MIRALAX / GLYCOLAX) packet Take 17 g by mouth daily. 01/11/14   Ricki Rodriguez, MD  promethazine (PHENERGAN) 25 MG tablet Take 1 tablet (25 mg total) by mouth every 6 (six) hours as needed for nausea or vomiting. Patient not taking: Reported on 01/19/2014 08/20/13   Louann Sjogren,  PA-C  ranitidine (ZANTAC) 300 MG tablet Take 300 mg by mouth 2 (two) times daily.  01/04/14   Historical Provider, MD  spironolactone (ALDACTONE) 25 MG tablet Take 25 mg by mouth daily.    Historical Provider, MD  tamsulosin (FLOMAX) 0.4 MG CAPS capsule Take 0.4 mg by mouth at bedtime.     Historical Provider, MD  warfarin (COUMADIN) 4 MG tablet Take 2-4 mg by mouth daily at 6 PM. Take 1/2 tablet (2 mg) on Sundays, take 1 tablet (4 mg) Monday through Saturday    Historical Provider, MD   BP 163/76 mmHg  Pulse 65  Temp(Src) 98.5 F (36.9 C) (Oral)  Resp 16  Ht 5' 8.5" (1.74 m)  Wt 188 lb (85.276 kg)  BMI 28.17 kg/m2  SpO2 100% Physical Exam  Constitutional: He is oriented to person, place, and time. He appears well-developed and well-nourished.  nauseous  HENT:  Head: Normocephalic and atraumatic.  Right Ear: External ear normal.  Left Ear: External ear normal.  Nose: Nose normal.  Mouth/Throat: Oropharynx is clear and moist. No oropharyngeal exudate.  Eyes: Conjunctivae and EOM are normal. Pupils are equal, round, and reactive to light.  Neck: Normal range of motion. Neck supple.  Cardiovascular: Normal rate, regular rhythm, normal heart sounds and intact  distal pulses.  Exam reveals no gallop and no friction rub.   No murmur heard. Pulmonary/Chest: Effort normal and breath sounds normal. No respiratory distress. He has no wheezes.  Abdominal: Soft. Bowel sounds are normal. He exhibits no distension. There is tenderness (mild tenderness to palpation of the right lower quadrant.). There is no rebound and no guarding.  Genitourinary: Penis normal.  Normal-appearing testicles without tenderness to palpation.  No inguinal hernia noted.  Musculoskeletal: Normal range of motion. He exhibits no edema or tenderness.  Neurological: He is alert and oriented to person, place, and time.  Skin: Skin is warm and dry.  Psychiatric: He has a normal mood and affect. His behavior is normal.  Nursing note and vitals reviewed.   ED Course  Procedures (including critical care time) Labs Review Labs Reviewed  CBC WITH DIFFERENTIAL/PLATELET - Abnormal; Notable for the following:    Hemoglobin 12.3 (*)    HCT 35.9 (*)    RDW 16.3 (*)    Platelets 126 (*)    All other components within normal limits  COMPREHENSIVE METABOLIC PANEL - Abnormal; Notable for the following:    Glucose, Bld 103 (*)    Creatinine, Ser 1.72 (*)    Albumin 3.4 (*)    GFR calc non Af Amer 35 (*)    GFR calc Af Amer 41 (*)    All other components within normal limits  PROTIME-INR - Abnormal; Notable for the following:    Prothrombin Time 28.3 (*)    INR 2.63 (*)    All other components within normal limits  LIPASE, BLOOD  TROPONIN I  URINALYSIS, ROUTINE W REFLEX MICROSCOPIC    Imaging Review Dg Chest 2 View  03/26/2014   CLINICAL DATA:  Chest pain and vomiting. Right lower quadrant abdominal pain  EXAM: CHEST  2 VIEW  COMPARISON:  01/08/2014  FINDINGS: There is low lung volumes with interstitial crowding at the bases. No edema, consolidation, effusion, or pneumothorax. Mild cardiomegaly, with size accentuated by low volumes. There is unchanged aortic tortuosity.  IMPRESSION:  Stable low volume chest.   Electronically Signed   By: Marnee Spring M.D.   On: 03/26/2014 22:19   Ct Abdomen Pelvis  W Contrast  03/26/2014   CLINICAL DATA:  Right lower quadrant pain and vomiting.  Chest pain.  EXAM: CT ABDOMEN AND PELVIS WITH CONTRAST  TECHNIQUE: Multidetector CT imaging of the abdomen and pelvis was performed using the standard protocol following bolus administration of intravenous contrast.  CONTRAST:  80mL OMNIPAQUE IOHEXOL 300 MG/ML  SOLN  COMPARISON:  08/20/2013  FINDINGS: BODY WALL: Bilateral inguinal hernias with sigmoid colon on the left and small bowel on the right at the deep inguinal rings. More inferiorly there is fat herniation.  LOWER CHEST: Chronic cardiomegaly.  Coronary atherosclerosis.  ABDOMEN/PELVIS:  Liver: Stable 1 cm cyst in the left lobe.  Biliary: No evidence of biliary obstruction or stone.  Pancreas: Unremarkable.  Spleen: Unremarkable.  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  Kidneys and ureters: Bilateral renal cortical cysts. Many of the cysts are large, measuring up to 6 cm in the interpolar left kidney No hydronephrosis.  Bladder: Chronic outlet obstruction with bladder wall thickening, diverticula and cellules.  Reproductive: Enlarged prostate, projecting into the bladder base.  Bowel: Extensive distal colonic diverticulosis. No active inflammation or bowel obstruction. Appendectomy.  Retroperitoneum: No mass or adenopathy.  Peritoneum: No ascites or pneumoperitoneum.  Vascular: No acute abnormality.  OSSEOUS: No explanatory findings.  IMPRESSION: 1. No acute findings to explain right lower quadrant pain. 2. There are numerous incidental findings which are stable from 2015 and noted above.   Electronically Signed   By: Marnee Spring M.D.   On: 03/26/2014 23:56     EKG Interpretation   Date/Time:  Friday March 26 2014 20:59:31 EST Ventricular Rate:  61 PR Interval:  143 QRS Duration: 164 QT Interval:  474 QTC Calculation: 477 R  Axis:   -19 Text Interpretation:  Sinus rhythm Probable left atrial enlargement Left  bundle branch block No significant change since last tracing Confirmed by  Lillyanne Bradburn  MD, Braiden Presutti ([id number]) on 03/26/2014 9:07:59 PM      MDM   Final diagnoses:  Chest pain  RLQ abdominal pain  Vomiting    9:26 PM 81 y.o. male w hx of DVT/PE on coumadin, CVA, chronic abd pain who presents with abdominal pain and vomiting. He is currently complaining of right lower quadrant pain which is consistent with his chronic abdominal pain but has also developed some nausea and vomiting today. He also complains of some left-sided chest pain which feels dull and is intermittent in nature. He states that he has this pain chronically and it is unchanged from baseline. He is afebrile and vital signs are unremarkable here. He has no evidence of inguinal hernia or testicular pain on my exam. He has had multiple noncontributory workups in the past. Given the fact that he has nausea and vomiting on exam will get screening imaging and lab work.  12:47 AM I interpreted/reviewed the labs and/or imaging which were non-contributory. Pt tolerating po. I have discussed the diagnosis/risks/treatment options with the patient and believe the pt to be eligible for discharge home to follow-up with his pcp. We also discussed returning to the ED immediately if new or worsening sx occur. We discussed the sx which are most concerning (e.g., worsening abd pain, fever) that necessitate immediate return. Medications administered to the patient during their visit and any new prescriptions provided to the patient are listed below.  Medications given during this visit Medications  iohexol (OMNIPAQUE) 300 MG/ML solution 25 mL (not administered)  ondansetron (ZOFRAN) injection 4 mg (4 mg Intravenous Given 03/26/14 2121)  sodium chloride 0.9 % bolus 500 mL (0 mLs Intravenous Stopped 03/26/14 2302)  fentaNYL (SUBLIMAZE) injection 50 mcg (50 mcg Intravenous Given  03/26/14 2121)  iohexol (OMNIPAQUE) 300 MG/ML solution 80 mL (80 mLs Intravenous Contrast Given 03/26/14 2310)    New Prescriptions   ONDANSETRON (ZOFRAN ODT) 4 MG DISINTEGRATING TABLET    4mg  ODT q4 hours prn nausea/vomit     Purvis SheffieldForrest Iridian Reader, MD 03/27/14 [phone]

## 2014-03-26 NOTE — ED Notes (Signed)
CT notified that patient has completed his contrast.

## 2014-03-26 NOTE — ED Notes (Addendum)
Pt arrives via EMS with c/o chronic ongoing GERD and abdominal pain. New onset nausea when eating. No vomiting, SHOB. States he hasn't taken some of his meds today. Chronic knee and arm pain. Chronic dizziness. 4MG  of zofran on board.

## 2014-03-27 LAB — URINALYSIS, ROUTINE W REFLEX MICROSCOPIC
BILIRUBIN URINE: NEGATIVE
Glucose, UA: NEGATIVE mg/dL
HGB URINE DIPSTICK: NEGATIVE
KETONES UR: NEGATIVE mg/dL
Leukocytes, UA: NEGATIVE
Nitrite: NEGATIVE
Protein, ur: NEGATIVE mg/dL
Specific Gravity, Urine: 1.013 (ref 1.005–1.030)
UROBILINOGEN UA: 0.2 mg/dL (ref 0.0–1.0)
pH: 6 (ref 5.0–8.0)

## 2014-03-27 MED ORDER — ONDANSETRON 4 MG PO TBDP: ORAL_TABLET | ORAL | Status: AC

## 2014-03-27 NOTE — Discharge Instructions (Signed)
Abdominal Pain °Many things can cause abdominal pain. Usually, abdominal pain is not caused by a disease and will improve without treatment. It can often be observed and treated at home. Your health care provider will do a physical exam and possibly order blood tests and X-rays to help determine the seriousness of your pain. However, in many cases, more time must pass before a clear cause of the pain can be found. Before that point, your health care provider may not know if you need more testing or further treatment. °HOME CARE INSTRUCTIONS  °Monitor your abdominal pain for any changes. The following actions may help to alleviate any discomfort you are experiencing: °· Only take over-the-counter or prescription medicines as directed by your health care provider. °· Do not take laxatives unless directed to do so by your health care provider. °· Try a clear liquid diet (broth, tea, or water) as directed by your health care provider. Slowly move to a bland diet as tolerated. °SEEK MEDICAL CARE IF: °· You have unexplained abdominal pain. °· You have abdominal pain associated with nausea or diarrhea. °· You have pain when you urinate or have a bowel movement. °· You experience abdominal pain that wakes you in the night. °· You have abdominal pain that is worsened or improved by eating food. °· You have abdominal pain that is worsened with eating fatty foods. °· You have a fever. °SEEK IMMEDIATE MEDICAL CARE IF:  °· Your pain does not go away within 2 hours. °· You keep throwing up (vomiting). °· Your pain is felt only in portions of the abdomen, such as the right side or the left lower portion of the abdomen. °· You pass bloody or black tarry stools. °MAKE SURE YOU: °· Understand these instructions.   °· Will watch your condition.   °· Will get help right away if you are not doing well or get worse.   °Document Released: 10/18/2004 Document Revised: 01/13/2013 Document Reviewed: 09/17/2012 °[employer]® Patient Information  ©2015 [employer]. This information is not intended to replace advice given to you by your health care provider. Make sure you discuss any questions you have with your health care provider. ° °Chest Pain (Nonspecific) °It is often hard to give a specific diagnosis for the cause of chest pain. There is always a chance that your pain could be related to something serious, such as a heart attack or a blood clot in the lungs. You need to follow up with your health care provider for further evaluation. °CAUSES  °· Heartburn. °· Pneumonia or bronchitis. °· Anxiety or stress. °· Inflammation around your heart (pericarditis) or lung (pleuritis or pleurisy). °· A blood clot in the lung. °· A collapsed lung (pneumothorax). It can develop suddenly on its own (spontaneous pneumothorax) or from trauma to the chest. °· Shingles infection (herpes zoster virus). °The chest wall is composed of bones, muscles, and cartilage. Any of these can be the source of the pain. °· The bones can be bruised by injury. °· The muscles or cartilage can be strained by coughing or overwork. °· The cartilage can be affected by inflammation and become sore (costochondritis). °DIAGNOSIS  °Lab tests or other studies may be needed to find the cause of your pain. Your health care provider may have you take a test called an ambulatory electrocardiogram (ECG). An ECG records your heartbeat patterns over a 24-hour period. You may also have other tests, such as: °· Transthoracic echocardiogram (TTE). During echocardiography, sound waves are used to evaluate how blood   flows through your heart. °· Transesophageal echocardiogram (TEE). °· Cardiac monitoring. This allows your health care provider to monitor your heart rate and rhythm in real time. °· Holter monitor. This is a portable device that records your heartbeat and can help diagnose heart arrhythmias. It allows your health care provider to track your heart activity for several days, if needed. °· Stress  tests by exercise or by giving medicine that makes the heart beat faster. °TREATMENT  °· Treatment depends on what may be causing your chest pain. Treatment may include: °¨ Acid blockers for heartburn. °¨ Anti-inflammatory medicine. °¨ Pain medicine for inflammatory conditions. °¨ Antibiotics if an infection is present. °· You may be advised to change lifestyle habits. This includes stopping smoking and avoiding alcohol, caffeine, and chocolate. °· You may be advised to keep your head raised (elevated) when sleeping. This reduces the chance of acid going backward from your stomach into your esophagus. °Most of the time, nonspecific chest pain will improve within 2-3 days with rest and mild pain medicine.  °HOME CARE INSTRUCTIONS  °· If antibiotics were prescribed, take them as directed. Finish them even if you start to feel better. °· For the next few days, avoid physical activities that bring on chest pain. Continue physical activities as directed. °· Do not use any tobacco products, including cigarettes, chewing tobacco, or electronic cigarettes. °· Avoid drinking alcohol. °· Only take medicine as directed by your health care provider. °· Follow your health care provider's suggestions for further testing if your chest pain does not go away. °· Keep any follow-up appointments you made. If you do not go to an appointment, you could develop lasting (chronic) problems with pain. If there is any problem keeping an appointment, call to reschedule. °SEEK MEDICAL CARE IF:  °· Your chest pain does not go away, even after treatment. °· You have a rash with blisters on your chest. °· You have a fever. °SEEK IMMEDIATE MEDICAL CARE IF:  °· You have increased chest pain or pain that spreads to your arm, neck, jaw, back, or abdomen. °· You have shortness of breath. °· You have an increasing cough, or you cough up blood. °· You have severe back or abdominal pain. °· You feel nauseous or vomit. °· You have severe weakness. °· You  faint. °· You have chills. °This is an emergency. Do not wait to see if the pain will go away. Get medical help at once. Call your local emergency services (911 in [place] Do not drive yourself to the hospital. °MAKE SURE YOU:  °· Understand these instructions. °· Will watch your condition. °· Will get help right away if you are not doing well or get worse. °Document Released: 10/18/2004 Document Revised: 01/13/2013 Document Reviewed: 08/14/2007 °Exit[employer]® Patient Information ©2015 [employer]. This information is not intended to replace advice given to you by your health care provider. Make sure you discuss any questions you have with your health care provider. ° °

## 2014-03-31 ENCOUNTER — Encounter (HOSPITAL_COMMUNITY): Payer: Self-pay | Admitting: Emergency Medicine

## 2014-03-31 ENCOUNTER — Emergency Department (HOSPITAL_COMMUNITY)
Admission: EM | Admit: 2014-03-31 | Discharge: 2014-03-31 | Disposition: A | Discharge status: Home / Self Care | Payer: Medicare Other | Attending: Emergency Medicine | Admitting: Emergency Medicine

## 2014-03-31 ENCOUNTER — Emergency Department (HOSPITAL_COMMUNITY): Payer: Medicare Other

## 2014-03-31 DIAGNOSIS — Z79899 Other long term (current) drug therapy: Secondary | ICD-10-CM | POA: Diagnosis not present

## 2014-03-31 DIAGNOSIS — E785 Hyperlipidemia, unspecified: Secondary | ICD-10-CM | POA: Insufficient documentation

## 2014-03-31 DIAGNOSIS — Z8673 Personal history of transient ischemic attack (TIA), and cerebral infarction without residual deficits: Secondary | ICD-10-CM | POA: Insufficient documentation

## 2014-03-31 DIAGNOSIS — I2510 Atherosclerotic heart disease of native coronary artery without angina pectoris: Secondary | ICD-10-CM | POA: Diagnosis not present

## 2014-03-31 DIAGNOSIS — R109 Unspecified abdominal pain: Secondary | ICD-10-CM | POA: Diagnosis not present

## 2014-03-31 DIAGNOSIS — M1990 Unspecified osteoarthritis, unspecified site: Secondary | ICD-10-CM | POA: Insufficient documentation

## 2014-03-31 DIAGNOSIS — Z9889 Other specified postprocedural states: Secondary | ICD-10-CM | POA: Diagnosis not present

## 2014-03-31 DIAGNOSIS — Z86711 Personal history of pulmonary embolism: Secondary | ICD-10-CM | POA: Diagnosis not present

## 2014-03-31 DIAGNOSIS — Z8701 Personal history of pneumonia (recurrent): Secondary | ICD-10-CM | POA: Insufficient documentation

## 2014-03-31 DIAGNOSIS — E039 Hypothyroidism, unspecified: Secondary | ICD-10-CM | POA: Diagnosis not present

## 2014-03-31 DIAGNOSIS — K219 Gastro-esophageal reflux disease without esophagitis: Secondary | ICD-10-CM | POA: Diagnosis not present

## 2014-03-31 DIAGNOSIS — G8929 Other chronic pain: Secondary | ICD-10-CM | POA: Diagnosis not present

## 2014-03-31 DIAGNOSIS — I10 Essential (primary) hypertension: Secondary | ICD-10-CM | POA: Diagnosis not present

## 2014-03-31 DIAGNOSIS — E119 Type 2 diabetes mellitus without complications: Secondary | ICD-10-CM | POA: Diagnosis not present

## 2014-03-31 DIAGNOSIS — Z86718 Personal history of other venous thrombosis and embolism: Secondary | ICD-10-CM | POA: Insufficient documentation

## 2014-03-31 DIAGNOSIS — J45909 Unspecified asthma, uncomplicated: Secondary | ICD-10-CM | POA: Diagnosis not present

## 2014-03-31 DIAGNOSIS — Z7951 Long term (current) use of inhaled steroids: Secondary | ICD-10-CM | POA: Insufficient documentation

## 2014-03-31 DIAGNOSIS — Z87891 Personal history of nicotine dependence: Secondary | ICD-10-CM | POA: Insufficient documentation

## 2014-03-31 DIAGNOSIS — Z9861 Coronary angioplasty status: Secondary | ICD-10-CM | POA: Diagnosis not present

## 2014-03-31 DIAGNOSIS — Z9049 Acquired absence of other specified parts of digestive tract: Secondary | ICD-10-CM | POA: Diagnosis not present

## 2014-03-31 LAB — URINALYSIS, ROUTINE W REFLEX MICROSCOPIC
Bilirubin Urine: NEGATIVE
Glucose, UA: NEGATIVE mg/dL
Hgb urine dipstick: NEGATIVE
Ketones, ur: NEGATIVE mg/dL
Leukocytes, UA: NEGATIVE
Nitrite: NEGATIVE
Protein, ur: NEGATIVE mg/dL
Specific Gravity, Urine: 1.008 (ref 1.005–1.030)
Urobilinogen, UA: 0.2 mg/dL (ref 0.0–1.0)
pH: 5.5 (ref 5.0–8.0)

## 2014-03-31 LAB — CBC WITH DIFFERENTIAL/PLATELET
Basophils Absolute: 0 10*3/uL (ref 0.0–0.1)
Basophils Relative: 0 % (ref 0–1)
Eosinophils Absolute: 0.1 10*3/uL (ref 0.0–0.7)
Eosinophils Relative: 1 % (ref 0–5)
HCT: 36.7 % — ABNORMAL LOW (ref 39.0–52.0)
Hemoglobin: 12.6 g/dL — ABNORMAL LOW (ref 13.0–17.0)
Lymphocytes Relative: 36 % (ref 12–46)
Lymphs Abs: 2.1 10*3/uL (ref 0.7–4.0)
MCH: 28.3 pg (ref 26.0–34.0)
MCHC: 34.3 g/dL (ref 30.0–36.0)
MCV: 82.3 fL (ref 78.0–100.0)
Monocytes Absolute: 0.6 10*3/uL (ref 0.1–1.0)
Monocytes Relative: 11 % (ref 3–12)
Neutro Abs: 3 10*3/uL (ref 1.7–7.7)
Neutrophils Relative %: 52 % (ref 43–77)
Platelets: 140 10*3/uL — ABNORMAL LOW (ref 150–400)
RBC: 4.46 MIL/uL (ref 4.22–5.81)
RDW: 16.2 % — ABNORMAL HIGH (ref 11.5–15.5)
WBC: 5.7 10*3/uL (ref 4.0–10.5)

## 2014-03-31 LAB — COMPREHENSIVE METABOLIC PANEL
ALT: 17 U/L (ref 0–53)
AST: 22 U/L (ref 0–37)
Albumin: 3.6 g/dL (ref 3.5–5.2)
Alkaline Phosphatase: 55 U/L (ref 39–117)
Anion gap: 7 (ref 5–15)
BUN: 26 mg/dL — ABNORMAL HIGH (ref 6–23)
CO2: 26 mmol/L (ref 19–32)
Calcium: 9 mg/dL (ref 8.4–10.5)
Chloride: 106 mmol/L (ref 96–112)
Creatinine, Ser: 2.09 mg/dL — ABNORMAL HIGH (ref 0.50–1.35)
GFR calc Af Amer: 32 mL/min — ABNORMAL LOW (ref >90)
GFR calc non Af Amer: 28 mL/min — ABNORMAL LOW (ref >90)
Glucose, Bld: 86 mg/dL (ref 70–99)
Potassium: 4 mmol/L (ref 3.5–5.1)
Sodium: 139 mmol/L (ref 135–145)
Total Bilirubin: 0.7 mg/dL (ref 0.3–1.2)
Total Protein: 7 g/dL (ref 6.0–8.3)

## 2014-03-31 LAB — LIPASE, BLOOD: Lipase: 38 U/L (ref 11–59)

## 2014-03-31 MED ORDER — DICYCLOMINE HCL 10 MG PO CAPS
10.0000 mg | ORAL_CAPSULE | Freq: Once | ORAL | Status: AC
  Administered 2014-03-31: 10 mg via ORAL
  Filled 2014-03-31: qty 1

## 2014-03-31 MED ORDER — OXYCODONE-ACETAMINOPHEN 5-325 MG PO TABS
1.0000 | ORAL_TABLET | Freq: Once | ORAL | Status: AC
  Administered 2014-03-31: 1 via ORAL
  Filled 2014-03-31: qty 1

## 2014-03-31 NOTE — ED Notes (Signed)
Pt returned to room  

## 2014-03-31 NOTE — Discharge Instructions (Signed)

## 2014-03-31 NOTE — ED Notes (Signed)
[facility]

## 2014-03-31 NOTE — ED Notes (Signed)
Per EMS: pt from home c/o RLQ abd pain x 1 month; pt sts chronic issue

## 2014-03-31 NOTE — ED Notes (Signed)
Patient transported to Ultrasound 

## 2014-04-05 NOTE — ED Provider Notes (Signed)
CSN: [id number]     Arrival date & time 03/31/14  1516 History   First MD Initiated Contact with Patient 03/31/14 1752     Chief Complaint  Patient presents with  . Abdominal Pain     (Consider location/radiation/quality/duration/timing/severity/associated sxs/prior Treatment) HPI   81 year old male with abdominal pain. Per his report and review records, seems more chronic in nature. She reports has been worse over the past couple weeks though. Pain is in the right side. No appreciable exacerbating relieving factors. Occasional nausea. No vomiting. Complaining that he will sometimes "lose my urine." No urinary complaints otherwise. No acute change in bowel habits. No fevers or chills.  Past Medical History  Diagnosis Date  . GERD (gastroesophageal reflux disease)   . Chronic back pain   . Gout     no problems in 2 years  . Pulmonary embolism, bilateral 2010  . Hyperlipemia   . Anginal pain   . DVT, lower extremity 1980's    LLE  . Asthma     "touch"  . Pneumonia 1970's  . Arthritis     "mostly in my knees and shoulders"  . Collapsed lung     "for 14 years" right   . Diabetes mellitus   . Hypertension   . Depression   . Anxiety   . Hearing loss   . Glaucoma   . Complication of anesthesia   . PONV (postoperative nausea and vomiting)   . Renal disorder ~ 03/2011    "wasn't functioning right"  . Stroke   . Stroke     "just a little one"  . Hypothyroidism   . Chronic abdominal pain   . Paroxysmal atrial fibrillation   . Myocardial infarction 1987; 1989   Past Surgical History  Procedure Laterality Date  . Esophagogastroduodenoscopy  12/22/2010    Procedure: ESOPHAGOGASTRODUODENOSCOPY (EGD);  Surgeon: Theda Belfast;  Location: [facility] ENDOS[facility];  Service: Endoscopy;  Laterality: N/A;  . Appendectomy  1950  . Cataract extraction w/ intraocular lens  implant, bilateral Bilateral 1990's  . Coronary angioplasty with stent placement      "think I have 4 total"  . Inguinal  hernia repair Bilateral 03/28/2012    Procedure: LAPAROSCOPIC BILATERAL INGUINAL HERNIA REPAIR;  Surgeon: Ardeth Sportsman, MD;  Location: [facility];  Service: General;  Laterality: Bilateral;  . Insertion of mesh Bilateral 03/28/2012    Procedure: INSERTION OF MESH;  Surgeon: Ardeth Sportsman, MD;  Location: [facility];  Service: General;  Laterality: Bilateral;  . Hernia repair    . Esophagogastroduodenoscopy N/A 04/30/2012    Procedure: ESOPHAGOGASTRODUODENOSCOPY (EGD);  Surgeon: Theda Belfast, MD;  Location: [facility] ENDOSCOPY;  Service: Endoscopy;  Laterality: N/A;  . Colonoscopy N/A 05/01/2012    Procedure: COLONOSCOPY;  Surgeon: Theda Belfast, MD;  Location: [facility] ENDOSCOPY;  Service: Endoscopy;  Laterality: N/A;  . Cardiac catheterization    . Left heart catheterization with coronary angiogram N/A 02/19/2011    Procedure: LEFT HEART CATHETERIZATION WITH CORONARY ANGIOGRAM;  Surgeon: Robynn Pane, MD;  Location: [facility] CATH LAB;  Service: Cardiovascular;  Laterality: N/A;   Family History  Problem Relation Age of Onset  . Diabetes Mother   . Heart disease Father   . Lung cancer Brother   . Stroke Neg Hx   . Hypertension Neg Hx   . Hyperlipidemia Neg Hx   . Alcohol abuse Neg Hx    History  Substance Use Topics  . Smoking status: Former Smoker -- 2.00 packs/day for  3 years    Types: Cigarettes    Start date: 01/27/1946    Quit date: 01/22/1981  . Smokeless tobacco: Never Used  . Alcohol Use: No     Comment: 07/12/2011; "last drink of alcohol was ~ 1983"    Review of Systems  All systems reviewed and negative, other than as noted in HPI.   Allergies  Review of patient's allergies indicates no known allergies.  Home Medications   Prior to Admission medications   Medication Sig Start Date End Date Taking? Authorizing Provider  acetaminophen (TYLENOL) 500 MG tablet Take 1,000 mg by mouth every 6 (six) hours as needed (pain).     Historical Provider, MD  acetaminophen (TYLENOL) 650 MG CR tablet  Take 1,300 mg by mouth every 8 (eight) hours as needed for pain.    Historical Provider, MD  amiodarone (PACERONE) 200 MG tablet Take 100 mg by mouth daily.     Historical Provider, MD  atorvastatin (LIPITOR) 40 MG tablet Take 40 mg by mouth daily.    Historical Provider, MD  carvedilol (COREG) 3.125 MG tablet Take 3.125 mg by mouth 2 (two) times daily with a meal. 8am, 6pm    Historical Provider, MD  cetirizine (ZYRTEC) 10 MG tablet Take 10 mg by mouth daily.    Historical Provider, MD  dorzolamide-timolol (COSOPT) 22.3-6.8 MG/ML ophthalmic solution Place 1 drop into the right eye 3 (three) times daily.    Historical Provider, MD  esomeprazole (NEXIUM) 40 MG capsule Take 40 mg by mouth daily.     Historical Provider, MD  fluticasone (FLONASE) 50 MCG/ACT nasal spray Place 1 spray into both nostrils 2 (two) times daily.  08/13/13   Historical Provider, MD  furosemide (LASIX) 40 MG tablet Take 40 mg by mouth daily.     Historical Provider, MD  HYDROcodone-acetaminophen (NORCO/VICODIN) 5-325 MG per tablet Take 2 tablets by mouth every 4 (four) hours as needed for moderate pain or severe pain. Patient not taking: Reported on 01/19/2014 08/19/13   Clement SayresStaci Shepard, MD  isosorbide mononitrate (IMDUR) 60 MG 24 hr tablet Take 60 mg by mouth daily.    Historical Provider, MD  levothyroxine (SYNTHROID, LEVOTHROID) 50 MCG tablet Take 50 mcg by mouth daily before breakfast.     Historical Provider, MD  linagliptin (TRADJENTA) 5 MG TABS tablet Take 5 mg by mouth daily.    Historical Provider, MD  losartan (COZAAR) 100 MG tablet Take 100 mg by mouth daily.    Historical Provider, MD  nitroGLYCERIN (NITROSTAT) 0.4 MG SL tablet Place 0.4 mg under the tongue every 5 (five) minutes as needed for chest pain.    Historical Provider, MD  ondansetron (ZOFRAN ODT) 4 MG disintegrating tablet 4mg  ODT q4 hours prn nausea/vomit 03/27/14   Purvis SheffieldForrest Harrison, MD  polyethylene glycol (MIRALAX / GLYCOLAX) packet Take 17 g by mouth daily.  01/11/14   Orpah CobbAjay Kadakia, MD  promethazine (PHENERGAN) 25 MG tablet Take 1 tablet (25 mg total) by mouth every 6 (six) hours as needed for nausea or vomiting. Patient not taking: Reported on 01/19/2014 08/20/13   Oswaldo ConroyVictoria Creech, PA-C  ranitidine (ZANTAC) 300 MG tablet Take 300 mg by mouth daily.  01/04/14   Historical Provider, MD  spironolactone (ALDACTONE) 25 MG tablet Take 25 mg by mouth daily.    Historical Provider, MD  tamsulosin (FLOMAX) 0.4 MG CAPS capsule Take 0.8 mg by mouth at bedtime.     Historical Provider, MD  warfarin (COUMADIN) 4 MG tablet Take 2-4 mg by  mouth daily at 6 PM. Take 1/2 tablet (2 mg) on Sundays and Wednesdays, take 1 tablet (4 mg) all other days of the week    Historical Provider, MD   BP 183/85 mmHg  Pulse 62  Temp(Src) 97.7 F (36.5 C) (Oral)  Resp 19  SpO2 99% Physical Exam  Constitutional: He appears well-developed and well-nourished. No distress.  HENT:  Head: Normocephalic and atraumatic.  Eyes: Conjunctivae are normal. Right eye exhibits no discharge. Left eye exhibits no discharge.  Neck: Neck supple.  Cardiovascular: Normal rate, regular rhythm and normal heart sounds.  Exam reveals no gallop and no friction rub.   No murmur heard. Pulmonary/Chest: Effort normal and breath sounds normal. No respiratory distress.  Abdominal: Soft. He exhibits no distension. There is tenderness. There is no rebound.  Right-sided abdominal tenderness without rebound or guarding. No distention.  Musculoskeletal: He exhibits no edema or tenderness.  Neurological: He is alert.  Skin: Skin is warm and dry.  Psychiatric: He has a normal mood and affect. His behavior is normal. Thought content normal.  Nursing note and vitals reviewed.   ED Course  Procedures (including critical care time) Labs Review Labs Reviewed  CBC WITH DIFFERENTIAL/PLATELET - Abnormal; Notable for the following:    Hemoglobin 12.6 (*)    HCT 36.7 (*)    RDW 16.2 (*)    Platelets 140 (*)     All other components within normal limits  COMPREHENSIVE METABOLIC PANEL - Abnormal; Notable for the following:    BUN 26 (*)    Creatinine, Ser 2.09 (*)    GFR calc non Af Amer 28 (*)    GFR calc Af Amer 32 (*)    All other components within normal limits  URINALYSIS, ROUTINE W REFLEX MICROSCOPIC  LIPASE, BLOOD    Imaging Review No results found.   EKG Interpretation None      MDM   Final diagnoses:  Chronic abdominal pain    81 year old male with abdominal pain. Seems chronic in nature. Previous workups removed and pre-unremarkable. Ultrasound today without acute pathology. I feel stable for discharge at this time.    Raeford Razor, MD 04/05/14 1115

## 2014-04-24 ENCOUNTER — Emergency Department (HOSPITAL_COMMUNITY): Payer: Medicare Other

## 2014-04-24 ENCOUNTER — Emergency Department (HOSPITAL_COMMUNITY)
Admission: EM | Admit: 2014-04-24 | Discharge: 2014-04-24 | Disposition: A | Discharge status: Home / Self Care | Payer: Medicare Other | Attending: Emergency Medicine | Admitting: Emergency Medicine

## 2014-04-24 ENCOUNTER — Encounter (HOSPITAL_COMMUNITY): Payer: Self-pay

## 2014-04-24 DIAGNOSIS — J45909 Unspecified asthma, uncomplicated: Secondary | ICD-10-CM | POA: Insufficient documentation

## 2014-04-24 DIAGNOSIS — E785 Hyperlipidemia, unspecified: Secondary | ICD-10-CM | POA: Diagnosis not present

## 2014-04-24 DIAGNOSIS — R079 Chest pain, unspecified: Secondary | ICD-10-CM | POA: Insufficient documentation

## 2014-04-24 DIAGNOSIS — Z87891 Personal history of nicotine dependence: Secondary | ICD-10-CM | POA: Diagnosis not present

## 2014-04-24 DIAGNOSIS — Z79899 Other long term (current) drug therapy: Secondary | ICD-10-CM | POA: Insufficient documentation

## 2014-04-24 DIAGNOSIS — Z86718 Personal history of other venous thrombosis and embolism: Secondary | ICD-10-CM | POA: Insufficient documentation

## 2014-04-24 DIAGNOSIS — M1990 Unspecified osteoarthritis, unspecified site: Secondary | ICD-10-CM | POA: Insufficient documentation

## 2014-04-24 DIAGNOSIS — I10 Essential (primary) hypertension: Secondary | ICD-10-CM | POA: Diagnosis not present

## 2014-04-24 DIAGNOSIS — G8929 Other chronic pain: Secondary | ICD-10-CM | POA: Diagnosis not present

## 2014-04-24 DIAGNOSIS — E119 Type 2 diabetes mellitus without complications: Secondary | ICD-10-CM | POA: Diagnosis not present

## 2014-04-24 DIAGNOSIS — Z8701 Personal history of pneumonia (recurrent): Secondary | ICD-10-CM | POA: Diagnosis not present

## 2014-04-24 DIAGNOSIS — Z7951 Long term (current) use of inhaled steroids: Secondary | ICD-10-CM | POA: Diagnosis not present

## 2014-04-24 DIAGNOSIS — Z8673 Personal history of transient ischemic attack (TIA), and cerebral infarction without residual deficits: Secondary | ICD-10-CM | POA: Insufficient documentation

## 2014-04-24 DIAGNOSIS — Z87448 Personal history of other diseases of urinary system: Secondary | ICD-10-CM | POA: Diagnosis not present

## 2014-04-24 DIAGNOSIS — Z9889 Other specified postprocedural states: Secondary | ICD-10-CM | POA: Insufficient documentation

## 2014-04-24 DIAGNOSIS — I480 Paroxysmal atrial fibrillation: Secondary | ICD-10-CM | POA: Diagnosis not present

## 2014-04-24 DIAGNOSIS — Z8659 Personal history of other mental and behavioral disorders: Secondary | ICD-10-CM | POA: Insufficient documentation

## 2014-04-24 DIAGNOSIS — K219 Gastro-esophageal reflux disease without esophagitis: Secondary | ICD-10-CM | POA: Insufficient documentation

## 2014-04-24 DIAGNOSIS — H409 Unspecified glaucoma: Secondary | ICD-10-CM | POA: Diagnosis not present

## 2014-04-24 DIAGNOSIS — H9190 Unspecified hearing loss, unspecified ear: Secondary | ICD-10-CM | POA: Diagnosis not present

## 2014-04-24 DIAGNOSIS — I252 Old myocardial infarction: Secondary | ICD-10-CM | POA: Insufficient documentation

## 2014-04-24 DIAGNOSIS — M109 Gout, unspecified: Secondary | ICD-10-CM | POA: Diagnosis not present

## 2014-04-24 DIAGNOSIS — Z9861 Coronary angioplasty status: Secondary | ICD-10-CM | POA: Diagnosis not present

## 2014-04-24 DIAGNOSIS — Z86711 Personal history of pulmonary embolism: Secondary | ICD-10-CM | POA: Diagnosis not present

## 2014-04-24 DIAGNOSIS — E039 Hypothyroidism, unspecified: Secondary | ICD-10-CM | POA: Diagnosis not present

## 2014-04-24 LAB — BASIC METABOLIC PANEL
Anion gap: 5 (ref 5–15)
BUN: 19 mg/dL (ref 6–23)
CO2: 26 mmol/L (ref 19–32)
CREATININE: 1.73 mg/dL — AB (ref 0.50–1.35)
Calcium: 8.8 mg/dL (ref 8.4–10.5)
Chloride: 113 mmol/L — ABNORMAL HIGH (ref 96–112)
GFR calc Af Amer: 41 mL/min — ABNORMAL LOW (ref >90)
GFR calc non Af Amer: 35 mL/min — ABNORMAL LOW (ref >90)
GLUCOSE: 110 mg/dL — AB (ref 70–99)
Potassium: 3.4 mmol/L — ABNORMAL LOW (ref 3.5–5.1)
Sodium: 144 mmol/L (ref 135–145)

## 2014-04-24 LAB — I-STAT TROPONIN, ED
TROPONIN I, POC: 0.02 ng/mL (ref 0.00–0.08)
Troponin i, poc: 0.02 ng/mL (ref 0.00–0.08)

## 2014-04-24 LAB — CBC
HCT: 35 % — ABNORMAL LOW (ref 39.0–52.0)
HEMOGLOBIN: 11.7 g/dL — AB (ref 13.0–17.0)
MCH: 27.9 pg (ref 26.0–34.0)
MCHC: 33.4 g/dL (ref 30.0–36.0)
MCV: 83.3 fL (ref 78.0–100.0)
PLATELETS: 125 10*3/uL — AB (ref 150–400)
RBC: 4.2 MIL/uL — AB (ref 4.22–5.81)
RDW: 16.6 % — AB (ref 11.5–15.5)
WBC: 5.7 10*3/uL (ref 4.0–10.5)

## 2014-04-24 MED ORDER — NITROGLYCERIN 0.4 MG SL SUBL
0.4000 mg | SUBLINGUAL_TABLET | SUBLINGUAL | Status: DC | PRN
  Administered 2014-04-24: 0.4 mg via SUBLINGUAL
  Filled 2014-04-24: qty 1

## 2014-04-24 MED ORDER — NITROGLYCERIN 0.4 MG SL SUBL: 0.4000 mg | SUBLINGUAL_TABLET | SUBLINGUAL | Status: AC | PRN

## 2014-04-24 NOTE — ED Notes (Signed)
Pt placed in gown and in bed. PT monitored by pulse ox, bp cuff, and 12-lead. 

## 2014-04-24 NOTE — ED Notes (Signed)
Per [facility], pt from home for chest pain and radiation of pain down her left arm. PTAR responded first on scene and gave him one of his own NTG and 324 of ASA. Took his pain away. [facility] Upon arrival to the hospital pt's pain is back. No nausea or vomiting or SOB.

## 2014-04-24 NOTE — ED Provider Notes (Signed)
CSN: [id number]     Arrival date & time 04/24/14  4098 History   First MD Initiated Contact with Patient 04/24/14 [phone]     Chief Complaint  Patient presents with  . Chest Pain     (Consider location/radiation/quality/duration/timing/severity/associated sxs/prior Treatment) The history is provided by the patient and medical records.   This is an 81 year old male with past medical history significant for hyperlipidemia, hypertension, depression, anxiety, COPD, gout, GERD, history of DVT and PE not currently on anticoagulation, paroxysmal A. fib and prior MI with multivessel PCI, presenting to the ED for chest pain.  Patient states he has chest pain "all the time" but it has seemed worse over the past week or so. He.denies any alleviating or exacerbating factors. He denies any shortness of breath, diaphoresis, nausea, or vomiting. He states upon EMS arrival he was given a nitroglycerin which resolved his pain but it has since come back. States he has a little bit of pain in his left armpit but denies any other radiation of pain. Patient states he has been frustrated as his cardiologist, Dr. Sharyn Lull, and other specialist at [facility] have been unable to find anything wrong with his heart recently but he continues having pain.  Most recent 2D echo in Dec 2015 with estimated 50-55% EF.  VSS.  Last heart cath in 2013 by Dr. Sharyn Lull with the following conclusions: FINDINGS: LVEDP is 6 mmHg. Left main has 15-20% proximal and mid stenosis. LAD has 10-15% proximal sequential stenosis. The mid stented portion is widely patent and has 20-25% distal stenosis and 75-80% apical stenosis. Vessel at the apex is very small. Diagonal 1 and 2 were very small, which has mild disease. Left circumflex has 30-40% mid stenosis. OM 1 and OM 2 had 20-25% proximal stenosis. OM 3 is small, which has 20-25% stenosis. RCA has 20-25% proximal and mid stenosis and 30-40% distal sequential stenosis. Distal  stent is patent. The mid stent has 20-25% stenosis. PDA and PLV branches were very small. The patient tolerated procedure well. There were no complications. The patient was transferred to recovery room in stable condition.  Past Medical History  Diagnosis Date  . GERD (gastroesophageal reflux disease)   . Chronic back pain   . Gout     no problems in 2 years  . Pulmonary embolism, bilateral 2010  . Hyperlipemia   . Anginal pain   . DVT, lower extremity 1980's    LLE  . Asthma     "touch"  . Pneumonia 1970's  . Arthritis     "mostly in my knees and shoulders"  . Collapsed lung     "for 14 years" right   . Diabetes mellitus   . Hypertension   . Depression   . Anxiety   . Hearing loss   . Glaucoma   . Complication of anesthesia   . PONV (postoperative nausea and vomiting)   . Renal disorder ~ 03/2011    "wasn't functioning right"  . Stroke   . Stroke     "just a little one"  . Hypothyroidism   . Chronic abdominal pain   . Paroxysmal atrial fibrillation   . Myocardial infarction 1987; 1989   Past Surgical History  Procedure Laterality Date  . Esophagogastroduodenoscopy  12/22/2010    Procedure: ESOPHAGOGASTRODUODENOSCOPY (EGD);  Surgeon: Theda Belfast;  Location: [facility] ENDOSCOPY;  Service: Endoscopy;  Laterality: N/A;  . Appendectomy  1950  . Cataract extraction w/ intraocular lens  implant, bilateral Bilateral 1990's  .  Coronary angioplasty with stent placement      "think I have 4 total"  . Inguinal hernia repair Bilateral 03/28/2012    Procedure: LAPAROSCOPIC BILATERAL INGUINAL HERNIA REPAIR;  Surgeon: Ardeth Sportsman, MD;  Location: [facility];  Service: General;  Laterality: Bilateral;  . Insertion of mesh Bilateral 03/28/2012    Procedure: INSERTION OF MESH;  Surgeon: Ardeth Sportsman, MD;  Location: [facility]S;  Service: General;  Laterality: Bilateral;  . Hernia repair    . Esophagogastroduodenoscopy N/A 04/30/2012    Procedure: ESOPHAGOGASTRODUODENOSCOPY (EGD);   Surgeon: Theda Belfast, MD;  Location: [facility] ENDOSCOPY;  Service: Endoscopy;  Laterality: N/A;  . Colonoscopy N/A 05/01/2012    Procedure: COLONOSCOPY;  Surgeon: Theda Belfast, MD;  Location: [facility] ENDOSCOPY;  Service: Endoscopy;  Laterality: N/A;  . Cardiac catheterization    . Left heart catheterization with coronary angiogram N/A 02/19/2011    Procedure: LEFT HEART CATHETERIZATION WITH CORONARY ANGIOGRAM;  Surgeon: Robynn Pane, MD;  Location: [facility] CATH LAB;  Service: Cardiovascular;  Laterality: N/A;   Family History  Problem Relation Age of Onset  . Diabetes Mother   . Heart disease Father   . Lung cancer Brother   . Stroke Neg Hx   . Hypertension Neg Hx   . Hyperlipidemia Neg Hx   . Alcohol abuse Neg Hx    History  Substance Use Topics  . Smoking status: Former Smoker -- 2.00 packs/day for 3 years    Types: Cigarettes    Start date: 01/27/1946    Quit date: 01/22/1981  . Smokeless tobacco: Never Used  . Alcohol Use: No     Comment: 07/12/2011; "last drink of alcohol was ~ 1983"    Review of Systems  Cardiovascular: Positive for chest pain.  All other systems reviewed and are negative.     Allergies  Review of patient's allergies indicates no known allergies.  Home Medications   Prior to Admission medications   Medication Sig Start Date End Date Taking? Authorizing Provider  acetaminophen (TYLENOL) 500 MG tablet Take 1,000 mg by mouth every 6 (six) hours as needed (pain).     Historical Provider, MD  acetaminophen (TYLENOL) 650 MG CR tablet Take 1,300 mg by mouth every 8 (eight) hours as needed for pain.    Historical Provider, MD  amiodarone (PACERONE) 200 MG tablet Take 100 mg by mouth daily.     Historical Provider, MD  atorvastatin (LIPITOR) 40 MG tablet Take 40 mg by mouth daily.    Historical Provider, MD  carvedilol (COREG) 3.125 MG tablet Take 3.125 mg by mouth 2 (two) times daily with a meal. 8am, 6pm    Historical Provider, MD  cetirizine (ZYRTEC) 10 MG tablet  Take 10 mg by mouth daily.    Historical Provider, MD  dorzolamide-timolol (COSOPT) 22.3-6.8 MG/ML ophthalmic solution Place 1 drop into the right eye 3 (three) times daily.    Historical Provider, MD  esomeprazole (NEXIUM) 40 MG capsule Take 40 mg by mouth daily.     Historical Provider, MD  fluticasone (FLONASE) 50 MCG/ACT nasal spray Place 1 spray into both nostrils 2 (two) times daily.  08/13/13   Historical Provider, MD  furosemide (LASIX) 40 MG tablet Take 40 mg by mouth daily.     Historical Provider, MD  HYDROcodone-acetaminophen (NORCO/VICODIN) 5-325 MG per tablet Take 2 tablets by mouth every 4 (four) hours as needed for moderate pain or severe pain. Patient not taking: Reported on 01/19/2014 08/19/13   Clement Sayres,  MD  isosorbide mononitrate (IMDUR) 60 MG 24 hr tablet Take 60 mg by mouth daily.    Historical Provider, MD  levothyroxine (SYNTHROID, LEVOTHROID) 50 MCG tablet Take 50 mcg by mouth daily before breakfast.     Historical Provider, MD  linagliptin (TRADJENTA) 5 MG TABS tablet Take 5 mg by mouth daily.    Historical Provider, MD  losartan (COZAAR) 100 MG tablet Take 100 mg by mouth daily.    Historical Provider, MD  nitroGLYCERIN (NITROSTAT) 0.4 MG SL tablet Place 0.4 mg under the tongue every 5 (five) minutes as needed for chest pain.    Historical Provider, MD  ondansetron (ZOFRAN ODT) 4 MG disintegrating tablet 4mg  ODT q4 hours prn nausea/vomit 03/27/14   Purvis Sheffield, MD  polyethylene glycol (MIRALAX / GLYCOLAX) packet Take 17 g by mouth daily. 01/11/14   Orpah Cobb, MD  promethazine (PHENERGAN) 25 MG tablet Take 1 tablet (25 mg total) by mouth every 6 (six) hours as needed for nausea or vomiting. Patient not taking: Reported on 01/19/2014 08/20/13   Oswaldo Conroy, PA-C  ranitidine (ZANTAC) 300 MG tablet Take 300 mg by mouth daily.  01/04/14   Historical Provider, MD  spironolactone (ALDACTONE) 25 MG tablet Take 25 mg by mouth daily.    Historical Provider, MD   tamsulosin (FLOMAX) 0.4 MG CAPS capsule Take 0.8 mg by mouth at bedtime.     Historical Provider, MD  warfarin (COUMADIN) 4 MG tablet Take 2-4 mg by mouth daily at 6 PM. Take 1/2 tablet (2 mg) on Sundays and Wednesdays, take 1 tablet (4 mg) all other days of the week    Historical Provider, MD   BP 161/75 mmHg  Pulse 58  Temp(Src) 98.5 F (36.9 C) (Oral)  Resp 18  Ht 5\' 8"  (1.727 m)  Wt 185 lb (83.915 kg)  BMI 28.14 kg/m2  SpO2 100%   Physical Exam  Constitutional: He is oriented to person, place, and time. He appears well-developed and well-nourished.  Elderly, hard of hearing  HENT:  Head: Normocephalic and atraumatic.  Mouth/Throat: Oropharynx is clear and moist.  Eyes: Conjunctivae and EOM are normal. Pupils are equal, round, and reactive to light.  Neck: Normal range of motion.  Cardiovascular: Normal rate, regular rhythm and normal heart sounds.   Pulmonary/Chest: Effort normal and breath sounds normal.  Abdominal: Soft. Bowel sounds are normal.  Musculoskeletal: Normal range of motion.  No appreciable pitting edema of lower extremities  Neurological: He is alert and oriented to person, place, and time.  Skin: Skin is warm and dry.  Psychiatric: He has a normal mood and affect.  Nursing note and vitals reviewed.   ED Course  Procedures (including critical care time) Labs Review Labs Reviewed  CBC - Abnormal; Notable for the following:    RBC 4.20 (*)    Hemoglobin 11.7 (*)    HCT 35.0 (*)    RDW 16.6 (*)    Platelets 125 (*)    All other components within normal limits  BASIC METABOLIC PANEL - Abnormal; Notable for the following:    Potassium 3.4 (*)    Chloride 113 (*)    Glucose, Bld 110 (*)    Creatinine, Ser 1.73 (*)    GFR calc non Af Amer 35 (*)    GFR calc Af Amer 41 (*)    All other components within normal limits  I-STAT TROPOININ, ED  Rosezena Sensor, ED    Imaging Review Dg Chest 2 View  04/24/2014  CLINICAL DATA:  Left-sided chest pain for  several months, initial encounter  EXAM: CHEST  2 VIEW  COMPARISON:  03/26/2014  FINDINGS: Cardiac shadow remains enlarged. Aortic calcifications are noted. No focal infiltrate or sizable effusion is seen. No acute bony abnormality is noted.  IMPRESSION: No active cardiopulmonary disease.   Electronically Signed   By: Alcide CleverMark  Lukens M.D.   On: 04/24/2014 11:37     EKG Interpretation   Date/Time:  Saturday April 24 2014 09:27:14 EDT Ventricular Rate:  59 PR Interval:  142 QRS Duration: 165 QT Interval:  476 QTC Calculation: 472 R Axis:   -13 Text Interpretation:  Sinus rhythm Atrial premature complex Probable left  atrial enlargement Left bundle branch block No significant change since  last tracing Confirmed by PICKERING  MD, Harrold DonathNATHAN [phone]([id number]) on 04/24/2014  9:41:05 AM      MDM   Final diagnoses:  Chest pain, unspecified chest pain type   81 year old male here with chest pain. He states he has this very often but has been somewhat worse over the past week or so. No noted alleviating or exacerbating factors. Patient does have history of CAD with multivessel PCI in the past. He is in no acute distress on exam. He does not appear significantly fluid overloaded and is without complaint of SOB.  EKG sinus rhythm without acute ischemic changes. Lab work appears baseline for patient when compared with previous, troponin negative. Chest x-ray is clear.  Case discussed with Dr. Algie CofferKadakia who is covering for Dr. Sharyn LullHarwani and recalls patient well-- has been admitted multiple times over the past 3 months for chest pain with negative enzymes.  Dr. Algie CofferKadakia fells that if delta troponin negative he may be discharged home with close PCP FU.  Delta troponin negative. Patient states he is doing better at this time.  At this time, low suspicion for ACS, PE, dissection, or other acute cardiac event. Will d/c home with close PCP FU.  Refill of SL NTG given as patient states "i am running low."  Discussed plan with  patient, he/she acknowledged understanding and agreed with plan of care.  Return precautions given for new or worsening symptoms.  Case discussed with attending physician, Dr. Rubin PayorPickering, who agrees with assessment and plan of care.  Garlon HatchetLisa M Nox Talent, PA-C 04/24/14 1441  Garlon HatchetLisa M Thom Ollinger, PA-C 04/24/14 1442  Benjiman CoreNathan Pickering, MD 04/24/14 [phone]

## 2014-04-24 NOTE — Discharge Instructions (Signed)
Take the prescribed medication as directed for chest pain. Follow-up with Dr. Sharyn LullHarwani-- call to schedule appt. Return to the ED for new or worsening symptoms.

## 2014-04-26 ENCOUNTER — Telehealth: Payer: Self-pay | Admitting: Gastroenterology

## 2014-04-26 NOTE — Telephone Encounter (Signed)
Pt is wanting a second and third opinion for stomach issues he is having.  Pt [facility].  We have records from Dr. Elnoria HowardHung, but have received nothing [facility].  Advised that we do not do second opinions.  Pt's caregiver asked "what about 3rd and 4th opinions"?  Informed pt and caregiver that since pt has GI hx within 1 year, we have to have records reviewed by MD before we could schedule.  Caregiver got upset and stated patient was getting the run around.  Apologized for the inconvenience, but advised that we have to review all records to allow for appropriate care for all patients.

## 2014-04-27 ENCOUNTER — Telehealth: Payer: Self-pay | Admitting: Nurse Practitioner

## 2014-04-27 NOTE — Telephone Encounter (Addendum)
[name],  Pt was seen on 05/30/2012 by Gunnar FusiPaula (see office notes).  The procedure reports from [facility]Hung [facility].  We also have the reports from [facility] as well.  Pt is now requesting to see Dr. Juanda ChanceBrodie.  After speaking with patient, it came to light they have seen [facility] since seeing RogersPaula. Advised pt that we need these records and they state they send them to our record department last week.  Since we are doing 2nd and 3rd opinions with MD review and approval (see phone note from yesterday), can you please advise on this patient.  Thanks.

## 2014-04-27 NOTE — Telephone Encounter (Signed)
Patient was seen in 2014 by Willette ClusterPaula Guenther RNP and advised to return to Dr. Elnoria HowardHung until records could be reviewed.  [facility] and now wants to see Dr. Juanda ChanceBrodie.  He is not satisfied with the answers he is getting from [facility].   Patient with extensive GI history should have [facility] or if they are not happy with that practice should go to another tertiary center

## 2014-04-27 NOTE — Telephone Encounter (Signed)
Left message for patient to call back - records have been reviewed, and with the patients extensive GI hx, the patient would be better served at a tertiary clinic such [facility] or [facility].  Pt will need to have PCP refer them to one of those clinics.

## 2014-04-30 NOTE — Telephone Encounter (Signed)
Left message for patient on 04/30/2014 at 2:30 pm at [phone] to let them know I have their US Abdomen CD disk. Waiting for response from patient.

## 2014-05-11 ENCOUNTER — Emergency Department (HOSPITAL_COMMUNITY)
Admission: EM | Admit: 2014-05-11 | Discharge: 2014-05-11 | Disposition: A | Discharge status: Home / Self Care | Payer: Medicare Other | Attending: Emergency Medicine | Admitting: Emergency Medicine

## 2014-05-11 ENCOUNTER — Emergency Department (HOSPITAL_COMMUNITY): Payer: Medicare Other

## 2014-05-11 ENCOUNTER — Encounter (HOSPITAL_COMMUNITY): Payer: Self-pay | Admitting: *Deleted

## 2014-05-11 DIAGNOSIS — I10 Essential (primary) hypertension: Secondary | ICD-10-CM | POA: Insufficient documentation

## 2014-05-11 DIAGNOSIS — Z86718 Personal history of other venous thrombosis and embolism: Secondary | ICD-10-CM | POA: Insufficient documentation

## 2014-05-11 DIAGNOSIS — Z87448 Personal history of other diseases of urinary system: Secondary | ICD-10-CM | POA: Diagnosis not present

## 2014-05-11 DIAGNOSIS — Z86711 Personal history of pulmonary embolism: Secondary | ICD-10-CM | POA: Insufficient documentation

## 2014-05-11 DIAGNOSIS — E119 Type 2 diabetes mellitus without complications: Secondary | ICD-10-CM | POA: Diagnosis not present

## 2014-05-11 DIAGNOSIS — K5909 Other constipation: Secondary | ICD-10-CM

## 2014-05-11 DIAGNOSIS — K219 Gastro-esophageal reflux disease without esophagitis: Secondary | ICD-10-CM | POA: Insufficient documentation

## 2014-05-11 DIAGNOSIS — E039 Hypothyroidism, unspecified: Secondary | ICD-10-CM | POA: Diagnosis not present

## 2014-05-11 DIAGNOSIS — Z7951 Long term (current) use of inhaled steroids: Secondary | ICD-10-CM | POA: Insufficient documentation

## 2014-05-11 DIAGNOSIS — E785 Hyperlipidemia, unspecified: Secondary | ICD-10-CM | POA: Insufficient documentation

## 2014-05-11 DIAGNOSIS — K5900 Constipation, unspecified: Secondary | ICD-10-CM | POA: Insufficient documentation

## 2014-05-11 DIAGNOSIS — Z8673 Personal history of transient ischemic attack (TIA), and cerebral infarction without residual deficits: Secondary | ICD-10-CM | POA: Diagnosis not present

## 2014-05-11 DIAGNOSIS — Z8659 Personal history of other mental and behavioral disorders: Secondary | ICD-10-CM | POA: Insufficient documentation

## 2014-05-11 DIAGNOSIS — I252 Old myocardial infarction: Secondary | ICD-10-CM | POA: Diagnosis not present

## 2014-05-11 DIAGNOSIS — R1031 Right lower quadrant pain: Secondary | ICD-10-CM | POA: Diagnosis not present

## 2014-05-11 DIAGNOSIS — J45909 Unspecified asthma, uncomplicated: Secondary | ICD-10-CM | POA: Insufficient documentation

## 2014-05-11 DIAGNOSIS — Z8701 Personal history of pneumonia (recurrent): Secondary | ICD-10-CM | POA: Insufficient documentation

## 2014-05-11 DIAGNOSIS — IMO0002 Reserved for concepts with insufficient information to code with codable children: Secondary | ICD-10-CM

## 2014-05-11 DIAGNOSIS — Z79899 Other long term (current) drug therapy: Secondary | ICD-10-CM | POA: Diagnosis not present

## 2014-05-11 DIAGNOSIS — H9190 Unspecified hearing loss, unspecified ear: Secondary | ICD-10-CM | POA: Diagnosis not present

## 2014-05-11 DIAGNOSIS — G8929 Other chronic pain: Secondary | ICD-10-CM | POA: Diagnosis not present

## 2014-05-11 DIAGNOSIS — M1990 Unspecified osteoarthritis, unspecified site: Secondary | ICD-10-CM | POA: Diagnosis not present

## 2014-05-11 DIAGNOSIS — Z87891 Personal history of nicotine dependence: Secondary | ICD-10-CM | POA: Insufficient documentation

## 2014-05-11 LAB — COMPREHENSIVE METABOLIC PANEL
ALT: 17 U/L (ref 0–53)
AST: 23 U/L (ref 0–37)
Albumin: 3.4 g/dL — ABNORMAL LOW (ref 3.5–5.2)
Alkaline Phosphatase: 60 U/L (ref 39–117)
Anion gap: 7 (ref 5–15)
BUN: 27 mg/dL — AB (ref 6–23)
CHLORIDE: 111 mmol/L (ref 96–112)
CO2: 25 mmol/L (ref 19–32)
CREATININE: 1.71 mg/dL — AB (ref 0.50–1.35)
Calcium: 9.1 mg/dL (ref 8.4–10.5)
GFR calc Af Amer: 41 mL/min — ABNORMAL LOW (ref >90)
GFR calc non Af Amer: 36 mL/min — ABNORMAL LOW (ref >90)
GLUCOSE: 94 mg/dL (ref 70–99)
POTASSIUM: 4.5 mmol/L (ref 3.5–5.1)
Sodium: 143 mmol/L (ref 135–145)
Total Bilirubin: 0.5 mg/dL (ref 0.3–1.2)
Total Protein: 6.5 g/dL (ref 6.0–8.3)

## 2014-05-11 LAB — CBC WITH DIFFERENTIAL/PLATELET
Basophils Absolute: 0 10*3/uL (ref 0.0–0.1)
Basophils Relative: 0 % (ref 0–1)
Eosinophils Absolute: 0.1 10*3/uL (ref 0.0–0.7)
Eosinophils Relative: 1 % (ref 0–5)
HCT: 33.8 % — ABNORMAL LOW (ref 39.0–52.0)
Hemoglobin: 11.4 g/dL — ABNORMAL LOW (ref 13.0–17.0)
Lymphocytes Relative: 26 % (ref 12–46)
Lymphs Abs: 1.8 10*3/uL (ref 0.7–4.0)
MCH: 28.8 pg (ref 26.0–34.0)
MCHC: 33.7 g/dL (ref 30.0–36.0)
MCV: 85.4 fL (ref 78.0–100.0)
Monocytes Absolute: 1 10*3/uL (ref 0.1–1.0)
Monocytes Relative: 13 % — ABNORMAL HIGH (ref 3–12)
Neutro Abs: 4.3 10*3/uL (ref 1.7–7.7)
Neutrophils Relative %: 60 % (ref 43–77)
Platelets: 120 10*3/uL — ABNORMAL LOW (ref 150–400)
RBC: 3.96 MIL/uL — ABNORMAL LOW (ref 4.22–5.81)
RDW: 16.3 % — ABNORMAL HIGH (ref 11.5–15.5)
WBC: 7.2 10*3/uL (ref 4.0–10.5)

## 2014-05-11 LAB — URINALYSIS, ROUTINE W REFLEX MICROSCOPIC
Bilirubin Urine: NEGATIVE
Glucose, UA: NEGATIVE mg/dL
Hgb urine dipstick: NEGATIVE
Ketones, ur: NEGATIVE mg/dL
Leukocytes, UA: NEGATIVE
Nitrite: NEGATIVE
Protein, ur: NEGATIVE mg/dL
Specific Gravity, Urine: 1.016 (ref 1.005–1.030)
Urobilinogen, UA: 0.2 mg/dL (ref 0.0–1.0)
pH: 6 (ref 5.0–8.0)

## 2014-05-11 LAB — I-STAT CG4 LACTIC ACID, ED: Lactic Acid, Venous: 0.66 mmol/L (ref 0.5–2.0)

## 2014-05-11 MED ORDER — HYDROCODONE-ACETAMINOPHEN 5-325 MG PO TABS: 1.0000 | ORAL_TABLET | Freq: Four times a day (QID) | ORAL | Status: DC | PRN

## 2014-05-11 MED ORDER — HYDROCODONE-ACETAMINOPHEN 5-325 MG PO TABS
2.0000 | ORAL_TABLET | Freq: Once | ORAL | Status: AC
  Administered 2014-05-11: 2 via ORAL
  Filled 2014-05-11: qty 2

## 2014-05-11 NOTE — Discharge Instructions (Signed)

## 2014-05-11 NOTE — ED Provider Notes (Addendum)
CSN: [id number]     Arrival date & time 05/11/14  1231 History   First MD Initiated Contact with Patient 05/11/14 1258     Chief Complaint  Patient presents with  . Constipation  . Abdominal Pain     (Consider location/radiation/quality/duration/timing/severity/associated sxs/prior Treatment) Patient is a 81 y.o. male presenting with abdominal pain. The history is provided by the patient. No language interpreter was used.  Abdominal Pain Pain location:  RLQ Pain quality: aching   Pain radiates to:  Does not radiate Pain severity:  Moderate Duration: 1 year. Timing:  Constant Progression:  Unchanged Relieved by:  Nothing Worsened by:  Nothing tried Ineffective treatments:  None tried Associated symptoms: constipation   Associated symptoms comment:  Constipation and small caliber stools for 1 year. Has been dismissed by GI Risk factors: being elderly     Past Medical History  Diagnosis Date  . GERD (gastroesophageal reflux disease)   . Chronic back pain   . Gout     no problems in 2 years  . Pulmonary embolism, bilateral 2010  . Hyperlipemia   . Anginal pain   . DVT, lower extremity 1980's    LLE  . Asthma     "touch"  . Pneumonia 1970's  . Arthritis     "mostly in my knees and shoulders"  . Collapsed lung     "for 14 years" right   . Diabetes mellitus   . Hypertension   . Depression   . Anxiety   . Hearing loss   . Glaucoma   . Complication of anesthesia   . PONV (postoperative nausea and vomiting)   . Renal disorder ~ 03/2011    "wasn't functioning right"  . Stroke   . Stroke     "just a little one"  . Hypothyroidism   . Chronic abdominal pain   . Paroxysmal atrial fibrillation   . Myocardial infarction 1987; 1989   Past Surgical History  Procedure Laterality Date  . Esophagogastroduodenoscopy  12/22/2010    Procedure: ESOPHAGOGASTRODUODENOSCOPY (EGD);  Surgeon: Theda Belfast;  Location: [facility] ENDOS[facility];  Service: Endoscopy;  Laterality: N/A;  .  Appendectomy  1950  . Cataract extraction w/ intraocular lens  implant, bilateral Bilateral 1990's  . Coronary angioplasty with stent placement      "think I have 4 total"  . Inguinal hernia repair Bilateral 03/28/2012    Procedure: LAPAROSCOPIC BILATERAL INGUINAL HERNIA REPAIR;  Surgeon: Ardeth Sportsman, MD;  Location: [facility] ORS;  Service: General;  Laterality: Bilateral;  . Insertion of mesh Bilateral 03/28/2012    Procedure: INSERTION OF MESH;  Surgeon: Ardeth Sportsman, MD;  Location: [facility] OR[facility];  Service: General;  Laterality: Bilateral;  . Hernia repair    . Esophagogastroduodenoscopy N/A 04/30/2012    Procedure: ESOPHAGOGASTRODUODENOSCOPY (EGD);  Surgeon: Theda Belfast, MD;  Location: [facility] ENDOSCOPY;  Service: Endoscopy;  Laterality: N/A;  . Colonoscopy N/A 05/01/2012    Procedure: COLONOSCOPY;  Surgeon: Theda Belfast, MD;  Location: [facility] ENDOSCOPY;  Service: Endoscopy;  Laterality: N/A;  . Cardiac catheterization    . Left heart catheterization with coronary angiogram N/A 02/19/2011    Procedure: LEFT HEART CATHETERIZATION WITH CORONARY ANGIOGRAM;  Surgeon: Robynn Pane, MD;  Location: [facility] CATH LAB;  Service: Cardiovascular;  Laterality: N/A;   Family History  Problem Relation Age of Onset  . Diabetes Mother   . Heart disease Father   . Lung cancer Brother   . Stroke Neg Hx   . Hypertension  Neg Hx   . Hyperlipidemia Neg Hx   . Alcohol abuse Neg Hx    History  Substance Use Topics  . Smoking status: Former Smoker -- 2.00 packs/day for 3 years    Types: Cigarettes    Start date: 01/27/1946    Quit date: 01/22/1981  . Smokeless tobacco: Never Used  . Alcohol Use: No     Comment: 07/12/2011; "last drink of alcohol was ~ 1983"    Review of Systems  Gastrointestinal: Positive for abdominal pain and constipation.      Allergies  Review of patient's allergies indicates no known allergies.  Home Medications   Prior to Admission medications   Medication Sig Start Date End Date Taking?  Authorizing Provider  acetaminophen (TYLENOL) 650 MG CR tablet Take 1,300 mg by mouth every 8 (eight) hours as needed for pain.   Yes Historical Provider, MD  amiodarone (PACERONE) 200 MG tablet Take 200 mg by mouth daily.    Yes Historical Provider, MD  atorvastatin (LIPITOR) 40 MG tablet Take 40 mg by mouth daily.   Yes Historical Provider, MD  carvedilol (COREG) 3.125 MG tablet Take 3.125 mg by mouth 2 (two) times daily with a meal. 8am, 6pm   Yes Historical Provider, MD  fluticasone (FLONASE) 50 MCG/ACT nasal spray Place 1 spray into both nostrils 2 (two) times daily.  08/13/13  Yes Historical Provider, MD  furosemide (LASIX) 40 MG tablet Take 40 mg by mouth daily.    Yes Historical Provider, MD  isosorbide mononitrate (IMDUR) 60 MG 24 hr tablet Take 60 mg by mouth daily.   Yes Historical Provider, MD  levothyroxine (SYNTHROID, LEVOTHROID) 50 MCG tablet Take 50 mcg by mouth daily before breakfast.    Yes Historical Provider, MD  linagliptin (TRADJENTA) 5 MG TABS tablet Take 5 mg by mouth daily.   Yes Historical Provider, MD  nitroGLYCERIN (NITROSTAT) 0.4 MG SL tablet Place 1 tablet (0.4 mg total) under the tongue every 5 (five) minutes as needed for chest pain. 04/24/14  Yes Garlon Hatchet, PA-C  omeprazole (PRILOSEC) 40 MG capsule Take 40 mg by mouth daily after breakfast.    Yes Historical Provider, MD  ranitidine (ZANTAC) 300 MG tablet Take 300 mg by mouth daily.  01/04/14  Yes Historical Provider, MD  spironolactone (ALDACTONE) 25 MG tablet Take 25 mg by mouth daily.   Yes Historical Provider, MD  tamsulosin (FLOMAX) 0.4 MG CAPS capsule Take 0.8 mg by mouth at bedtime.    Yes Historical Provider, MD  warfarin (COUMADIN) 4 MG tablet Take 2-4 mg by mouth daily at 6 PM. Take 1/2 tablet (2 mg) on Sundays, and take 1 tablet (4 mg) all other days of the week   Yes Historical Provider, MD  acetaminophen (TYLENOL) 500 MG tablet Take 1,000 mg by mouth every 6 (six) hours as needed (pain).     Historical  Provider, MD  cetirizine (ZYRTEC) 10 MG tablet Take 10 mg by mouth daily.    Historical Provider, MD  dicyclomine (BENTYL) 10 MG capsule Take 10 mg by mouth 4 (four) times daily -  before meals and at bedtime.    Historical Provider, MD  dorzolamide-timolol (COSOPT) 22.3-6.8 MG/ML ophthalmic solution Place 1 drop into the right eye 3 (three) times daily.    Historical Provider, MD  esomeprazole (NEXIUM) 40 MG capsule Take 40 mg by mouth daily.     Historical Provider, MD  HYDROcodone-acetaminophen (NORCO) 5-325 MG per tablet Take 1 tablet by mouth every 6 (  six) hours as needed. 05/11/14   Toy CookeyMegan Docherty, MD  losartan (COZAAR) 100 MG tablet Take 100 mg by mouth daily.    Historical Provider, MD  ondansetron (ZOFRAN ODT) 4 MG disintegrating tablet 4mg  ODT q4 hours prn nausea/vomit 03/27/14   Purvis SheffieldForrest Harrison, MD  polyethylene glycol (MIRALAX / GLYCOLAX) packet Take 17 g by mouth daily. 01/11/14   Orpah CobbAjay Kadakia, MD  promethazine (PHENERGAN) 25 MG tablet Take 1 tablet (25 mg total) by mouth every 6 (six) hours as needed for nausea or vomiting. Patient not taking: Reported on 01/19/2014 08/20/13   Oswaldo ConroyVictoria Creech, PA-C   BP 173/74 mmHg  Pulse 60  Temp(Src) 97.9 F (36.6 C) (Oral)  Resp 18  Ht 5\' 8"  (1.727 m)  Wt 185 lb (83.915 kg)  BMI 28.14 kg/m2  SpO2 99% Physical Exam  Constitutional: He is oriented to person, place, and time. He appears well-developed and well-nourished. No distress.  HENT:  Head: Normocephalic and atraumatic.  Mouth/Throat: No oropharyngeal exudate.  Eyes: Pupils are equal, round, and reactive to light.  Neck: Normal range of motion. Neck supple.  Cardiovascular: Normal rate, regular rhythm and normal heart sounds.  Exam reveals no gallop and no friction rub.   No murmur heard. Pulmonary/Chest: Effort normal and breath sounds normal. No respiratory distress. He has no wheezes. He has no rales.  Abdominal: Soft. Bowel sounds are normal. He exhibits no distension and no mass.  There is no tenderness. There is no rebound and no guarding.  Genitourinary:    Right testis shows tenderness. Right testis shows no swelling. Cremasteric reflex is not absent on the right side.  Musculoskeletal: Normal range of motion. He exhibits no edema or tenderness.  Neurological: He is alert and oriented to person, place, and time.  Skin: Skin is warm and dry.  Psychiatric: He has a normal mood and affect.    ED Course  Procedures (including critical care time) Labs Review Labs Reviewed  COMPREHENSIVE METABOLIC PANEL - Abnormal; Notable for the following:    BUN 27 (*)    Creatinine, Ser 1.71 (*)    Albumin 3.4 (*)    GFR calc non Af Amer 36 (*)    GFR calc Af Amer 41 (*)    All other components within normal limits  CBC WITH DIFFERENTIAL/PLATELET - Abnormal; Notable for the following:    RBC 3.96 (*)    Hemoglobin 11.4 (*)    HCT 33.8 (*)    RDW 16.3 (*)    Platelets 120 (*)    Monocytes Relative 13 (*)    All other components within normal limits  URINE CULTURE  URINALYSIS, ROUTINE W REFLEX MICROSCOPIC  CBC WITH DIFFERENTIAL/PLATELET  I-STAT CG4 LACTIC ACID, ED    Imaging Review [place] Scrotum  05/11/2014   CLINICAL DATA:  Right scrotal pain  EXAM: SCROTAL ULTRASOUND  DOPPLER ULTRASOUND OF THE TESTICLES  TECHNIQUE: Complete ultrasound examination of the testicles, epididymis, and other scrotal structures was performed. Color and spectral Doppler ultrasound were also utilized to evaluate blood flow to the testicles.  COMPARISON:  None.  FINDINGS: Right testicle  Measurements: 3.6 x 2.1 x 2.2 cm. No mass or microlithiasis visualized.  Left testicle  Measurements: 3.0 x 1.2 x 2.6 cm. No mass or microlithiasis visualized.  Right epididymis:  3 mm cyst  Left epididymis:  Normal in size and appearance.  Hydrocele:  Small moderate right hydrocele.  Small left hydrocele.  Varicocele:  None visualized.  Pulsed Doppler interrogation of both testes  demonstrates normal low resistance  arterial and venous waveforms bilaterally.  IMPRESSION: Negative for testicular torsion or mass. Bilateral hydrocele right greater than left.   Electronically Signed   By: Marlan Palau M.D.   On: 05/11/2014 15:58   [place] Art/ven Flow Abd Pelv Doppler  05/11/2014   CLINICAL DATA:  Right scrotal pain  EXAM: SCROTAL ULTRASOUND  DOPPLER ULTRASOUND OF THE TESTICLES  TECHNIQUE: Complete ultrasound examination of the testicles, epididymis, and other scrotal structures was performed. Color and spectral Doppler ultrasound were also utilized to evaluate blood flow to the testicles.  COMPARISON:  None.  FINDINGS: Right testicle  Measurements: 3.6 x 2.1 x 2.2 cm. No mass or microlithiasis visualized.  Left testicle  Measurements: 3.0 x 1.2 x 2.6 cm. No mass or microlithiasis visualized.  Right epididymis:  3 mm cyst  Left epididymis:  Normal in size and appearance.  Hydrocele:  Small moderate right hydrocele.  Small left hydrocele.  Varicocele:  None visualized.  Pulsed Doppler interrogation of both testes demonstrates normal low resistance arterial and venous waveforms bilaterally.  IMPRESSION: Negative for testicular torsion or mass. Bilateral hydrocele right greater than left.   Electronically Signed   By: Marlan Palau M.D.   On: 05/11/2014 15:58     EKG Interpretation None     CLINICAL DATA: Right lower quadrant pain and vomiting. Chest pain.  EXAM: CT ABDOMEN AND PELVIS WITH CONTRAST  TECHNIQUE: Multidetector CT imaging of the abdomen and pelvis was performed using the standard protocol following bolus administration of intravenous contrast.  CONTRAST: 80mL OMNIPAQUE IOHEXOL 300 MG/ML SOLN  COMPARISON: 08/20/2013  FINDINGS: BODY WALL: Bilateral inguinal hernias with sigmoid colon on the left and small bowel on the right at the deep inguinal rings. More inferiorly there is fat herniation.  LOWER CHEST: Chronic cardiomegaly. Coronary atherosclerosis.  ABDOMEN/PELVIS:  Liver:  Stable 1 cm cyst in the left lobe.  Biliary: No evidence of biliary obstruction or stone.  Pancreas: Unremarkable.  Spleen: Unremarkable.  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  Kidneys and ureters: Bilateral renal cortical cysts. Many of the cysts are large, measuring up to 6 cm in the interpolar left kidney No hydronephrosis.  Bladder: Chronic outlet obstruction with bladder wall thickening, diverticula and cellules.  Reproductive: Enlarged prostate, projecting into the bladder base.  Bowel: Extensive distal colonic diverticulosis. No active inflammation or bowel obstruction. Appendectomy.  Retroperitoneum: No mass or adenopathy.  Peritoneum: No ascites or pneumoperitoneum.  Vascular: No acute abnormality.  OSSEOUS: No explanatory findings.  IMPRESSION: 1. No acute findings to explain right lower quadrant pain. 2. There are numerous incidental findings which are stable from 2015 and noted above.  MDM   Final diagnoses:  Chronic RLQ pain  Chronic constipation   Pt is a 81 y.o. male with Pmhx as above who presents with Chronic, daily, unchanged RLQ pain for at least one year as well as chronic, unchanged "constipation", but which he mean daily, smaller caliber stools for at least 1 year. He has been seen by PCP, multiple times in the ED, and has been dismissed by the [facility]. No cause of symptoms found despite multiple CT's, GI visits, and colonscopy. On PE, VSS, pt in NAD. +RLQ ttp w/o rebound or guarding, GU exam with tender R testicle, w/o swelling, nml lie & normal cremasteric. No acute lab findings, Urine nml, US scrotum unremarkable. I do not feel he requires imaging as symptoms are chronic, unchanged from prior. Will rec outpt PCP or health &  wellness center follow up.      [name] evaluation in the Emergency Department is complete. It has been determined that no acute conditions requiring further emergency intervention  are present at this time. The patient/guardian have been advised of the diagnosis and plan. We have discussed signs and symptoms that warrant return to the ED, such as changes or worsening in symptoms, worsening pain, fever, inability to tolerate liquids.       Toy Cookey, MD 05/11/14 2111  Toy Cookey, MD 05/30/14 [phone]

## 2014-05-11 NOTE — ED Notes (Signed)
Per EMS pt coming from home with c/o nausea, constipation, and abdominal pain since Sunday. Pt reports hx of same x 1 year.

## 2014-05-11 NOTE — ED Notes (Signed)
Bed: [facility] Expected date:  Expected time:  Means of arrival:  Comments: EMS-constipation 

## 2014-05-13 LAB — URINE CULTURE

## 2014-06-04 ENCOUNTER — Emergency Department (HOSPITAL_COMMUNITY)
Admission: EM | Admit: 2014-06-04 | Discharge: 2014-06-04 | Disposition: A | Discharge status: Home / Self Care | Payer: Medicare Other | Attending: Emergency Medicine | Admitting: Emergency Medicine

## 2014-06-04 ENCOUNTER — Encounter (HOSPITAL_COMMUNITY): Payer: Self-pay | Admitting: Family Medicine

## 2014-06-04 DIAGNOSIS — H9190 Unspecified hearing loss, unspecified ear: Secondary | ICD-10-CM | POA: Insufficient documentation

## 2014-06-04 DIAGNOSIS — Z79899 Other long term (current) drug therapy: Secondary | ICD-10-CM | POA: Diagnosis not present

## 2014-06-04 DIAGNOSIS — I10 Essential (primary) hypertension: Secondary | ICD-10-CM | POA: Insufficient documentation

## 2014-06-04 DIAGNOSIS — R1031 Right lower quadrant pain: Secondary | ICD-10-CM | POA: Diagnosis not present

## 2014-06-04 DIAGNOSIS — E785 Hyperlipidemia, unspecified: Secondary | ICD-10-CM | POA: Insufficient documentation

## 2014-06-04 DIAGNOSIS — Z8673 Personal history of transient ischemic attack (TIA), and cerebral infarction without residual deficits: Secondary | ICD-10-CM | POA: Insufficient documentation

## 2014-06-04 DIAGNOSIS — Z87891 Personal history of nicotine dependence: Secondary | ICD-10-CM | POA: Insufficient documentation

## 2014-06-04 DIAGNOSIS — Z9049 Acquired absence of other specified parts of digestive tract: Secondary | ICD-10-CM | POA: Diagnosis not present

## 2014-06-04 DIAGNOSIS — Z9889 Other specified postprocedural states: Secondary | ICD-10-CM | POA: Diagnosis not present

## 2014-06-04 DIAGNOSIS — I2510 Atherosclerotic heart disease of native coronary artery without angina pectoris: Secondary | ICD-10-CM | POA: Insufficient documentation

## 2014-06-04 DIAGNOSIS — Z87448 Personal history of other diseases of urinary system: Secondary | ICD-10-CM | POA: Insufficient documentation

## 2014-06-04 DIAGNOSIS — E119 Type 2 diabetes mellitus without complications: Secondary | ICD-10-CM | POA: Insufficient documentation

## 2014-06-04 DIAGNOSIS — M1990 Unspecified osteoarthritis, unspecified site: Secondary | ICD-10-CM | POA: Insufficient documentation

## 2014-06-04 DIAGNOSIS — R110 Nausea: Secondary | ICD-10-CM | POA: Diagnosis not present

## 2014-06-04 DIAGNOSIS — Z791 Long term (current) use of non-steroidal anti-inflammatories (NSAID): Secondary | ICD-10-CM | POA: Diagnosis not present

## 2014-06-04 DIAGNOSIS — Z9861 Coronary angioplasty status: Secondary | ICD-10-CM | POA: Diagnosis not present

## 2014-06-04 DIAGNOSIS — Z86711 Personal history of pulmonary embolism: Secondary | ICD-10-CM | POA: Insufficient documentation

## 2014-06-04 DIAGNOSIS — G8929 Other chronic pain: Secondary | ICD-10-CM | POA: Diagnosis not present

## 2014-06-04 DIAGNOSIS — Z8701 Personal history of pneumonia (recurrent): Secondary | ICD-10-CM | POA: Diagnosis not present

## 2014-06-04 DIAGNOSIS — K219 Gastro-esophageal reflux disease without esophagitis: Secondary | ICD-10-CM | POA: Insufficient documentation

## 2014-06-04 DIAGNOSIS — F419 Anxiety disorder, unspecified: Secondary | ICD-10-CM | POA: Insufficient documentation

## 2014-06-04 DIAGNOSIS — J45909 Unspecified asthma, uncomplicated: Secondary | ICD-10-CM | POA: Diagnosis not present

## 2014-06-04 DIAGNOSIS — H409 Unspecified glaucoma: Secondary | ICD-10-CM | POA: Diagnosis not present

## 2014-06-04 DIAGNOSIS — Z86718 Personal history of other venous thrombosis and embolism: Secondary | ICD-10-CM | POA: Insufficient documentation

## 2014-06-04 LAB — COMPREHENSIVE METABOLIC PANEL
ALBUMIN: 3.8 g/dL (ref 3.5–5.0)
ALK PHOS: 62 U/L (ref 38–126)
ALT: 21 U/L (ref 17–63)
AST: 27 U/L (ref 15–41)
Anion gap: 8 (ref 5–15)
BUN: 24 mg/dL — ABNORMAL HIGH (ref 6–20)
CHLORIDE: 107 mmol/L (ref 101–111)
CO2: 23 mmol/L (ref 22–32)
Calcium: 9.4 mg/dL (ref 8.9–10.3)
Creatinine, Ser: 1.97 mg/dL — ABNORMAL HIGH (ref 0.61–1.24)
GFR calc non Af Amer: 30 mL/min — ABNORMAL LOW (ref >60)
GFR, EST AFRICAN AMERICAN: 35 mL/min — AB (ref >60)
Glucose, Bld: 115 mg/dL — ABNORMAL HIGH (ref 65–99)
Potassium: 4.6 mmol/L (ref 3.5–5.1)
Sodium: 138 mmol/L (ref 135–145)
TOTAL PROTEIN: 7.6 g/dL (ref 6.5–8.1)
Total Bilirubin: 1 mg/dL (ref 0.3–1.2)

## 2014-06-04 LAB — CBC WITH DIFFERENTIAL/PLATELET
BASOS ABS: 0 10*3/uL (ref 0.0–0.1)
Basophils Relative: 0 % (ref 0–1)
Eosinophils Absolute: 0.1 10*3/uL (ref 0.0–0.7)
Eosinophils Relative: 1 % (ref 0–5)
HEMATOCRIT: 37.9 % — AB (ref 39.0–52.0)
HEMOGLOBIN: 12.8 g/dL — AB (ref 13.0–17.0)
LYMPHS PCT: 33 % (ref 12–46)
Lymphs Abs: 2.1 10*3/uL (ref 0.7–4.0)
MCH: 28.2 pg (ref 26.0–34.0)
MCHC: 33.8 g/dL (ref 30.0–36.0)
MCV: 83.5 fL (ref 78.0–100.0)
MONO ABS: 0.7 10*3/uL (ref 0.1–1.0)
Monocytes Relative: 11 % (ref 3–12)
NEUTROS PCT: 55 % (ref 43–77)
Neutro Abs: 3.5 10*3/uL (ref 1.7–7.7)
Platelets: 150 10*3/uL (ref 150–400)
RBC: 4.54 MIL/uL (ref 4.22–5.81)
RDW: 15.6 % — ABNORMAL HIGH (ref 11.5–15.5)
WBC: 6.4 10*3/uL (ref 4.0–10.5)

## 2014-06-04 LAB — URINALYSIS, ROUTINE W REFLEX MICROSCOPIC
Bilirubin Urine: NEGATIVE
Glucose, UA: NEGATIVE mg/dL
HGB URINE DIPSTICK: NEGATIVE
Ketones, ur: NEGATIVE mg/dL
LEUKOCYTES UA: NEGATIVE
NITRITE: NEGATIVE
Protein, ur: NEGATIVE mg/dL
Specific Gravity, Urine: 1.006 (ref 1.005–1.030)
Urobilinogen, UA: 0.2 mg/dL (ref 0.0–1.0)
pH: 5 (ref 5.0–8.0)

## 2014-06-04 MED ORDER — ONDANSETRON HCL 4 MG/2ML IJ SOLN
4.0000 mg | Freq: Once | INTRAMUSCULAR | Status: AC
  Administered 2014-06-04: 4 mg via INTRAVENOUS
  Filled 2014-06-04: qty 2

## 2014-06-04 MED ORDER — TRAMADOL HCL 50 MG PO TABS
50.0000 mg | ORAL_TABLET | Freq: Once | ORAL | Status: AC
  Administered 2014-06-04: 50 mg via ORAL
  Filled 2014-06-04: qty 1

## 2014-06-04 MED ORDER — ONDANSETRON HCL 4 MG PO TABS
4.0000 mg | ORAL_TABLET | Freq: Four times a day (QID) | ORAL | Status: DC
Start: 2014-06-04 — End: 2014-08-08

## 2014-06-04 MED ORDER — TRAMADOL HCL 50 MG PO TABS: 50.0000 mg | ORAL_TABLET | Freq: Four times a day (QID) | ORAL | Status: DC | PRN

## 2014-06-04 MED ORDER — SODIUM CHLORIDE 0.9 % IV BOLUS (SEPSIS)
1000.0000 mL | Freq: Once | INTRAVENOUS | Status: AC
  Administered 2014-06-04: 1000 mL via INTRAVENOUS

## 2014-06-04 NOTE — ED Notes (Signed)
Pt here with chronic lower abd pain and nausea. sts he cant hold his water.

## 2014-06-04 NOTE — Discharge Instructions (Signed)

## 2014-06-04 NOTE — ED Notes (Addendum)
Pt reports he missed his PCP appointment today because he "was running late", so he came to the ED instead.

## 2014-06-04 NOTE — ED Provider Notes (Signed)
CSN: [id number]     Arrival date & time 06/04/14  1551 History   First MD Initiated Contact with Patient 06/04/14 1643     Chief Complaint  Patient presents with  . Abdominal Pain   [name] is a 81 y.o. male  Past medical history significant for chronic right lower quadrant abdominal pain, GERD, DVT, diabetes, hypertension, depression, and MI who presents with right lower quadrant abdominal pain and nausea. The patient reports that his abdominal pain is "the exact same pain and had for months". The patient reports that he was supposed to see his outpatient primary physician this afternoon however, the patient missed the appointment and decided to come to the emergency department. The patient also reports that he ate a normal breakfast this morning however, he has had nausea ever since. The patient denies any emesis, any fevers, chills, chest pain, shortness of breath, constipation, diarrhea, dysuria. The patient reports that he has been to the emergency department many times over the last several months for this same pain and he knows he has had many negative workups thus far. The patient denies any other changes or differences from his prior presentations.   (Consider location/radiation/quality/duration/timing/severity/associated sxs/prior Treatment) Patient is a 81 y.o. male presenting with abdominal pain. The history is provided by the patient and medical records. No language interpreter was used.  Abdominal Pain Pain location:  RLQ Pain quality: aching   Pain radiates to:  Does not radiate Pain severity:  Moderate Onset quality:  Gradual Duration:  52 weeks Timing:  Intermittent Progression:  Waxing and waning Chronicity:  Chronic Context: previous surgery (prior hernia surgery)   Context: not sick contacts and not trauma   Relieved by:  Nothing Worsened by:  Palpation Ineffective treatments:  None tried Associated symptoms: nausea   Associated symptoms: no anorexia, no chest  pain, no chills, no constipation, no cough, no diarrhea, no dysuria, no fatigue, no fever, no hematuria, no shortness of breath and no vomiting   Nausea:    Severity:  Moderate   Onset quality:  Gradual   Duration:  2 days   Timing:  Constant   Progression:  Improving   Past Medical History  Diagnosis Date  . GERD (gastroesophageal reflux disease)   . Chronic back pain   . Gout     no problems in 2 years  . Pulmonary embolism, bilateral 2010  . Hyperlipemia   . Anginal pain   . DVT, lower extremity 1980's    LLE  . Asthma     "touch"  . Pneumonia 1970's  . Arthritis     "mostly in my knees and shoulders"  . Collapsed lung     "for 14 years" right   . Diabetes mellitus   . Hypertension   . Depression   . Anxiety   . Hearing loss   . Glaucoma   . Complication of anesthesia   . PONV (postoperative nausea and vomiting)   . Renal disorder ~ 03/2011    "wasn't functioning right"  . Stroke   . Stroke     "just a little one"  . Hypothyroidism   . Chronic abdominal pain   . Paroxysmal atrial fibrillation   . Myocardial infarction 1987; 1989   Past Surgical History  Procedure Laterality Date  . Esophagogastroduodenoscopy  12/22/2010    Procedure: ESOPHAGOGASTRODUODENOSCOPY (EGD);  Surgeon: Theda Belfast;  Location: [facility];  Service: Endoscopy;  Laterality: N/A;  . Appendectomy  1950  . Cataract extraction  w/ intraocular lens  implant, bilateral Bilateral 1990's  . Coronary angioplasty with stent placement      "think I have 4 total"  . Inguinal hernia repair Bilateral 03/28/2012    Procedure: LAPAROSCOPIC BILATERAL INGUINAL HERNIA REPAIR;  Surgeon: Ardeth SportsmanSteven C. Gross, MD;  Location: [facility];  Service: General;  Laterality: Bilateral;  . Insertion of mesh Bilateral 03/28/2012    Procedure: INSERTION OF MESH;  Surgeon: Ardeth SportsmanSteven C. Gross, MD;  Location: [facility];  Service: General;  Laterality: Bilateral;  . Hernia repair    . Esophagogastroduodenoscopy N/A 04/30/2012     Procedure: ESOPHAGOGASTRODUODENOSCOPY (EGD);  Surgeon: Theda BelfastPatrick D Hung, MD;  [facility];  Service: Endoscopy;  Laterality: N/A;  . Colonoscopy N/A 05/01/2012    Procedure: COLONOSCOPY;  Surgeon: Theda BelfastPatrick D Hung, MD;  [facility];  Service: Endoscopy;  Laterality: N/A;  . Cardiac catheterization    . Left heart catheterization with coronary angiogram N/A 02/19/2011    Procedure: LEFT HEART CATHETERIZATION WITH CORONARY ANGIOGRAM;  Surgeon: Robynn PaneMohan N Harwani, MD;  [facility];  Service: Cardiovascular;  Laterality: N/A;   Family History  Problem Relation Age of Onset  . Diabetes Mother   . Heart disease Father   . Lung cancer Brother   . Stroke Neg Hx   . Hypertension Neg Hx   . Hyperlipidemia Neg Hx   . Alcohol abuse Neg Hx    History  Substance Use Topics  . Smoking status: Former Smoker -- 2.00 packs/day for 3 years    Types: Cigarettes    Start date: 01/27/1946    Quit date: 01/22/1981  . Smokeless tobacco: Never Used  . Alcohol Use: No     Comment: 07/12/2011; "last drink of alcohol was ~ 1983"    Review of Systems  Constitutional: Negative for fever, chills, appetite change and fatigue.  HENT: Negative for congestion and rhinorrhea.   Eyes: Negative for visual disturbance.  Respiratory: Negative for cough, chest tightness, shortness of breath, wheezing and stridor.   Cardiovascular: Negative for chest pain and palpitations.  Gastrointestinal: Positive for nausea and abdominal pain. Negative for vomiting, diarrhea, constipation, blood in stool and anorexia.  Genitourinary: Negative for dysuria, hematuria, flank pain, scrotal swelling, penile pain and testicular pain.  Musculoskeletal: Negative for back pain, neck pain and neck stiffness.  Skin: Negative for rash and wound.  Neurological: Negative for headaches.  All other systems reviewed and are negative.     Allergies  Review of patient's allergies indicates no known allergies.  Home  Medications   Prior to Admission medications   Medication Sig Start Date End Date Taking? Authorizing Provider  acetaminophen (TYLENOL) 500 MG tablet Take 1,000 mg by mouth every 6 (six) hours as needed (pain).     Historical Provider, MD  acetaminophen (TYLENOL) 650 MG CR tablet Take 1,300 mg by mouth every 8 (eight) hours as needed for pain.    Historical Provider, MD  amiodarone (PACERONE) 200 MG tablet Take 200 mg by mouth daily.     Historical Provider, MD  atorvastatin (LIPITOR) 40 MG tablet Take 40 mg by mouth daily.    Historical Provider, MD  carvedilol (COREG) 3.125 MG tablet Take 3.125 mg by mouth 2 (two) times daily with a meal. 8am, 6pm    Historical Provider, MD  cetirizine (ZYRTEC) 10 MG tablet Take 10 mg by mouth daily.    Historical Provider, MD  dicyclomine (BENTYL) 10 MG capsule Take 10 mg by mouth 4 (four) times daily -  before meals and at bedtime.    Historical Provider, MD  dorzolamide-timolol (COSOPT) 22.3-6.8 MG/ML ophthalmic solution Place 1 drop into the right eye 3 (three) times daily.    Historical Provider, MD  esomeprazole (NEXIUM) 40 MG capsule Take 40 mg by mouth daily.     Historical Provider, MD  fluticasone (FLONASE) 50 MCG/ACT nasal spray Place 1 spray into both nostrils 2 (two) times daily.  08/13/13   Historical Provider, MD  furosemide (LASIX) 40 MG tablet Take 40 mg by mouth daily.     Historical Provider, MD  HYDROcodone-acetaminophen (NORCO) 5-325 MG per tablet Take 1 tablet by mouth every 6 (six) hours as needed. 05/11/14   Toy CookeyMegan Docherty, MD  isosorbide mononitrate (IMDUR) 60 MG 24 hr tablet Take 60 mg by mouth daily.    Historical Provider, MD  levothyroxine (SYNTHROID, LEVOTHROID) 50 MCG tablet Take 50 mcg by mouth daily before breakfast.     Historical Provider, MD  linagliptin (TRADJENTA) 5 MG TABS tablet Take 5 mg by mouth daily.    Historical Provider, MD  losartan (COZAAR) 100 MG tablet Take 100 mg by mouth daily.    Historical Provider, MD    nitroGLYCERIN (NITROSTAT) 0.4 MG SL tablet Place 1 tablet (0.4 mg total) under the tongue every 5 (five) minutes as needed for chest pain. 04/24/14   Garlon HatchetLisa M Sanders, PA-C  omeprazole (PRILOSEC) 40 MG capsule Take 40 mg by mouth daily after breakfast.     Historical Provider, MD  ondansetron (ZOFRAN ODT) 4 MG disintegrating tablet 4mg  ODT q4 hours prn nausea/vomit 03/27/14   Purvis SheffieldForrest Harrison, MD  polyethylene glycol (MIRALAX / GLYCOLAX) packet Take 17 g by mouth daily. 01/11/14   Orpah CobbAjay Kadakia, MD  promethazine (PHENERGAN) 25 MG tablet Take 1 tablet (25 mg total) by mouth every 6 (six) hours as needed for nausea or vomiting. Patient not taking: Reported on 01/19/2014 08/20/13   Oswaldo ConroyVictoria Creech, PA-C  ranitidine (ZANTAC) 300 MG tablet Take 300 mg by mouth daily.  01/04/14   Historical Provider, MD  spironolactone (ALDACTONE) 25 MG tablet Take 25 mg by mouth daily.    Historical Provider, MD  tamsulosin (FLOMAX) 0.4 MG CAPS capsule Take 0.8 mg by mouth at bedtime.     Historical Provider, MD  warfarin (COUMADIN) 4 MG tablet Take 2-4 mg by mouth daily at 6 PM. Take 1/2 tablet (2 mg) on Sundays, and take 1 tablet (4 mg) all other days of the week    Historical Provider, MD   BP 140/82 mmHg  Pulse 59  Temp(Src) 98.3 F (36.8 C)  Resp 18  SpO2 100% Physical Exam  Constitutional: He appears well-developed and well-nourished. No distress.  HENT:  Head: Normocephalic.  Mouth/Throat: No oropharyngeal exudate.  Eyes: Conjunctivae and EOM are normal. Pupils are equal, round, and reactive to light. No scleral icterus.  Neck: Normal range of motion.  Cardiovascular: Normal rate, regular rhythm, normal heart sounds and intact distal pulses.   No murmur heard. Pulmonary/Chest: Effort normal and breath sounds normal. No respiratory distress. He has no wheezes. He exhibits no tenderness.  Abdominal: Normal appearance. He exhibits no distension. There is tenderness in the right lower quadrant. There is no  rigidity, no rebound, no guarding, no CVA tenderness and negative Murphy's sign.    Genitourinary:    Right testis shows no tenderness. Cremasteric reflex is not absent on the right side. Left testis shows no tenderness. Cremasteric reflex is not absent on the left side. Penile tenderness present.  Musculoskeletal: He exhibits no tenderness.  Neurological: He is alert. He displays normal reflexes. He exhibits normal muscle tone.  Skin: Skin is warm. He is not diaphoretic. No pallor.  Psychiatric: He has a normal mood and affect.  Nursing note and vitals reviewed.   ED Course  Procedures (including critical care time) Labs Review Labs Reviewed  CBC WITH DIFFERENTIAL/PLATELET - Abnormal; Notable for the following:    Hemoglobin 12.8 (*)    HCT 37.9 (*)    RDW 15.6 (*)    All other components within normal limits  COMPREHENSIVE METABOLIC PANEL - Abnormal; Notable for the following:    Glucose, Bld 115 (*)    BUN 24 (*)    Creatinine, Ser 1.97 (*)    GFR calc non Af Amer 30 (*)    GFR calc Af Amer 35 (*)    All other components within normal limits  URINALYSIS, ROUTINE W REFLEX MICROSCOPIC    Imaging Review No results found.   EKG Interpretation None      MDM   Final diagnoses:  Right lower quadrant abdominal pain   [name] is a 81 y.o. male  Past medical history significant for chronic right lower quadrant abdominal pain, GERD, DVT, diabetes, hypertension, depression, and MI who presents with right lower quadrant abdominal pain and nausea. Per the patient's report, this pain is the same chronic pain that he isn't struggling with for many months. The patient says that he was supposed to see his physician today however, after missing an appointment the patient wanted to come to the emergency department for further evaluation due to his pains. The patient's only other symptom on arrival was the nausea which she says is improving.  On exam, the patient has mild  tenderness in the right lower quadrant however, the patient did not have any other positive findings. The patient had diagnostic laboratory testing performed as seen above. The patient's urine did not show evidence of abnormality, his CBC appeared similar to prior with no leukocytosis, and his CMP showed a creatinine similar to prior with otherwise unremarkable findings.  For the patient's abdominal pain, he was given a dose of tramadol which he reports resolved his pain. The patient's Zofran resolved his nausea.  Given the patient's chronic nature of his symptoms, history and physical exam did not suggest a new etiology of his discomfort. The patient's abdomen was overall soft with no exams suggestive of peritonitis. The patient was given a prescription for pain medicine and nausea medicine and was directed to follow up with his primary physician for further long-term pain management. The patient voiced  understanding of the plan of care with return precautions and follow-up. The patient had no other questions, concerns, or complaints and the patient was discharged in good condition.  This patient was seen with Dr. Preston Fleeting, emergency medicine attending.   Theda Belfast, MD 06/05/14 4098  Dione Booze, MD 06/05/14 [phone]

## 2014-06-14 ENCOUNTER — Telehealth: Payer: Self-pay | Admitting: Internal Medicine

## 2014-06-14 NOTE — Telephone Encounter (Signed)
[facility] 23 pages to Dr. Juanda ChanceBrodie

## 2014-06-16 ENCOUNTER — Telehealth: Payer: Self-pay

## 2014-06-16 NOTE — Telephone Encounter (Signed)
06/16/14 [facility] and filed on shelf. IH

## 2014-07-17 ENCOUNTER — Emergency Department (HOSPITAL_COMMUNITY)
Admission: EM | Admit: 2014-07-17 | Discharge: 2014-07-17 | Disposition: A | Discharge status: Home / Self Care | Payer: Medicare Other | Attending: Emergency Medicine | Admitting: Emergency Medicine

## 2014-07-17 ENCOUNTER — Emergency Department (HOSPITAL_COMMUNITY): Payer: Medicare Other

## 2014-07-17 ENCOUNTER — Encounter (HOSPITAL_COMMUNITY): Payer: Self-pay | Admitting: *Deleted

## 2014-07-17 DIAGNOSIS — J45909 Unspecified asthma, uncomplicated: Secondary | ICD-10-CM | POA: Insufficient documentation

## 2014-07-17 DIAGNOSIS — Z7951 Long term (current) use of inhaled steroids: Secondary | ICD-10-CM | POA: Diagnosis not present

## 2014-07-17 DIAGNOSIS — I10 Essential (primary) hypertension: Secondary | ICD-10-CM | POA: Diagnosis not present

## 2014-07-17 DIAGNOSIS — R079 Chest pain, unspecified: Secondary | ICD-10-CM | POA: Insufficient documentation

## 2014-07-17 DIAGNOSIS — H9190 Unspecified hearing loss, unspecified ear: Secondary | ICD-10-CM | POA: Insufficient documentation

## 2014-07-17 DIAGNOSIS — F419 Anxiety disorder, unspecified: Secondary | ICD-10-CM | POA: Diagnosis not present

## 2014-07-17 DIAGNOSIS — G8929 Other chronic pain: Secondary | ICD-10-CM | POA: Diagnosis not present

## 2014-07-17 DIAGNOSIS — I252 Old myocardial infarction: Secondary | ICD-10-CM | POA: Diagnosis not present

## 2014-07-17 DIAGNOSIS — Z9889 Other specified postprocedural states: Secondary | ICD-10-CM | POA: Insufficient documentation

## 2014-07-17 DIAGNOSIS — Z9089 Acquired absence of other organs: Secondary | ICD-10-CM | POA: Diagnosis not present

## 2014-07-17 DIAGNOSIS — Z8701 Personal history of pneumonia (recurrent): Secondary | ICD-10-CM | POA: Diagnosis not present

## 2014-07-17 DIAGNOSIS — E039 Hypothyroidism, unspecified: Secondary | ICD-10-CM | POA: Insufficient documentation

## 2014-07-17 DIAGNOSIS — Z86718 Personal history of other venous thrombosis and embolism: Secondary | ICD-10-CM | POA: Insufficient documentation

## 2014-07-17 DIAGNOSIS — Z9861 Coronary angioplasty status: Secondary | ICD-10-CM | POA: Diagnosis not present

## 2014-07-17 DIAGNOSIS — Z79899 Other long term (current) drug therapy: Secondary | ICD-10-CM | POA: Diagnosis not present

## 2014-07-17 DIAGNOSIS — Z87891 Personal history of nicotine dependence: Secondary | ICD-10-CM | POA: Insufficient documentation

## 2014-07-17 DIAGNOSIS — Z87448 Personal history of other diseases of urinary system: Secondary | ICD-10-CM | POA: Insufficient documentation

## 2014-07-17 DIAGNOSIS — Z8673 Personal history of transient ischemic attack (TIA), and cerebral infarction without residual deficits: Secondary | ICD-10-CM | POA: Diagnosis not present

## 2014-07-17 DIAGNOSIS — Z7901 Long term (current) use of anticoagulants: Secondary | ICD-10-CM | POA: Diagnosis not present

## 2014-07-17 DIAGNOSIS — E785 Hyperlipidemia, unspecified: Secondary | ICD-10-CM | POA: Diagnosis not present

## 2014-07-17 DIAGNOSIS — M159 Polyosteoarthritis, unspecified: Secondary | ICD-10-CM | POA: Diagnosis not present

## 2014-07-17 DIAGNOSIS — H409 Unspecified glaucoma: Secondary | ICD-10-CM | POA: Diagnosis not present

## 2014-07-17 DIAGNOSIS — R1084 Generalized abdominal pain: Secondary | ICD-10-CM | POA: Diagnosis not present

## 2014-07-17 DIAGNOSIS — F329 Major depressive disorder, single episode, unspecified: Secondary | ICD-10-CM | POA: Insufficient documentation

## 2014-07-17 DIAGNOSIS — Z86711 Personal history of pulmonary embolism: Secondary | ICD-10-CM | POA: Diagnosis not present

## 2014-07-17 DIAGNOSIS — E119 Type 2 diabetes mellitus without complications: Secondary | ICD-10-CM | POA: Insufficient documentation

## 2014-07-17 DIAGNOSIS — R1030 Lower abdominal pain, unspecified: Secondary | ICD-10-CM | POA: Diagnosis present

## 2014-07-17 DIAGNOSIS — K219 Gastro-esophageal reflux disease without esophagitis: Secondary | ICD-10-CM | POA: Insufficient documentation

## 2014-07-17 DIAGNOSIS — I480 Paroxysmal atrial fibrillation: Secondary | ICD-10-CM | POA: Insufficient documentation

## 2014-07-17 DIAGNOSIS — I25119 Atherosclerotic heart disease of native coronary artery with unspecified angina pectoris: Secondary | ICD-10-CM | POA: Diagnosis not present

## 2014-07-17 HISTORY — DX: Atherosclerotic heart disease of native coronary artery without angina pectoris: I25.10

## 2014-07-17 LAB — BASIC METABOLIC PANEL
Anion gap: 3 — ABNORMAL LOW (ref 5–15)
BUN: 25 mg/dL — AB (ref 6–20)
CHLORIDE: 108 mmol/L (ref 101–111)
CO2: 26 mmol/L (ref 22–32)
Calcium: 8.8 mg/dL — ABNORMAL LOW (ref 8.9–10.3)
Creatinine, Ser: 2.02 mg/dL — ABNORMAL HIGH (ref 0.61–1.24)
GFR calc Af Amer: 34 mL/min — ABNORMAL LOW (ref >60)
GFR calc non Af Amer: 29 mL/min — ABNORMAL LOW (ref >60)
Glucose, Bld: 108 mg/dL — ABNORMAL HIGH (ref 65–99)
Potassium: 4.4 mmol/L (ref 3.5–5.1)
Sodium: 137 mmol/L (ref 135–145)

## 2014-07-17 LAB — URINALYSIS, ROUTINE W REFLEX MICROSCOPIC
BILIRUBIN URINE: NEGATIVE
GLUCOSE, UA: NEGATIVE mg/dL
Hgb urine dipstick: NEGATIVE
Ketones, ur: NEGATIVE mg/dL
LEUKOCYTES UA: NEGATIVE
NITRITE: NEGATIVE
PROTEIN: NEGATIVE mg/dL
SPECIFIC GRAVITY, URINE: 1.006 (ref 1.005–1.030)
Urobilinogen, UA: 0.2 mg/dL (ref 0.0–1.0)
pH: 6 (ref 5.0–8.0)

## 2014-07-17 LAB — CBC
HCT: 35.7 % — ABNORMAL LOW (ref 39.0–52.0)
HEMOGLOBIN: 12.2 g/dL — AB (ref 13.0–17.0)
MCH: 28.6 pg (ref 26.0–34.0)
MCHC: 34.2 g/dL (ref 30.0–36.0)
MCV: 83.8 fL (ref 78.0–100.0)
PLATELETS: 143 10*3/uL — AB (ref 150–400)
RBC: 4.26 MIL/uL (ref 4.22–5.81)
RDW: 15.2 % (ref 11.5–15.5)
WBC: 5.8 10*3/uL (ref 4.0–10.5)

## 2014-07-17 LAB — I-STAT TROPONIN, ED: Troponin i, poc: 0.03 ng/mL (ref 0.00–0.08)

## 2014-07-17 MED ORDER — TRAMADOL HCL 50 MG PO TABS
50.0000 mg | ORAL_TABLET | Freq: Once | ORAL | Status: AC
  Administered 2014-07-17: 50 mg via ORAL
  Filled 2014-07-17: qty 1

## 2014-07-17 NOTE — ED Notes (Signed)
Pt is here with lower abdominal pain that hurts all the time. Pain with urination.  Nauseated.

## 2014-07-17 NOTE — ED Notes (Signed)
Pt now states he is having chest pain.

## 2014-07-17 NOTE — ED Notes (Signed)
Pt ambulated to restroom. 

## 2014-07-17 NOTE — Discharge Instructions (Signed)

## 2014-07-17 NOTE — ED Provider Notes (Signed)
CSN: [id number]     Arrival date & time 07/17/14  1351 History   First MD Initiated Contact with Patient 07/17/14 1458     Chief Complaint  Patient presents with  . Abdominal Pain  . Chest Pain     (Consider location/radiation/quality/duration/timing/severity/associated sxs/prior Treatment) HPI Comments: Patient here for abdominal pain and chest pain. Has had both of these chronically, "for years and years and years". He will stated waxes and wanes and then states, "I keep it all the time." He states has been worse for the past 2 weeks. He developed pain will start in his suprapubic region and radiated up his left chest. He states the chest pain is sharp and all the same time. He denies fever, source of breath, vomiting, diarrhea.  Patient is a 81 y.o. male presenting with abdominal pain and chest pain. The history is provided by the patient.  Abdominal Pain Pain location:  Suprapubic Pain quality: aching, dull and sharp   Pain radiates to:  Chest (L abdomen) Onset quality:  Gradual Duration: Years. Timing:  Constant Progression:  Waxing and waning Chronicity:  Chronic Context: not sick contacts   Relieved by:  Nothing Worsened by:  Nothing tried Associated symptoms: chest pain ("I keep it all the time")   Associated symptoms: no diarrhea, no fever, no nausea, no shortness of breath and no vomiting   Chest Pain Associated symptoms: abdominal pain ("for years and years and years")   Associated symptoms: no fever, no nausea, no shortness of breath and not vomiting     Past Medical History  Diagnosis Date  . GERD (gastroesophageal reflux disease)   . Chronic back pain   . Gout     no problems in 2 years  . Pulmonary embolism, bilateral 2010  . Hyperlipemia   . Anginal pain   . DVT, lower extremity 1980's    LLE  . Asthma     "touch"  . Pneumonia 1970's  . Arthritis     "mostly in my knees and shoulders"  . Collapsed lung     "for 14 years" right   . Diabetes mellitus    . Hypertension   . Depression   . Anxiety   . Hearing loss   . Glaucoma   . Complication of anesthesia   . PONV (postoperative nausea and vomiting)   . Renal disorder ~ 03/2011    "wasn't functioning right"  . Stroke   . Stroke     "just a little one"  . Hypothyroidism   . Chronic abdominal pain   . Paroxysmal atrial fibrillation   . Myocardial infarction 1987; 1989  . Coronary artery disease    Past Surgical History  Procedure Laterality Date  . Esophagogastroduodenoscopy  12/22/2010    Procedure: ESOPHAGOGASTRODUODENOSCOPY (EGD);  Surgeon: Theda Belfast;  Location: [facility] ENDOS[facility];  Service: Endoscopy;  Laterality: N/A;  . Appendectomy  1950  . Cataract extraction w/ intraocular lens  implant, bilateral Bilateral 1990's  . Coronary angioplasty with stent placement      "think I have 4 total"  . Inguinal hernia repair Bilateral 03/28/2012    Procedure: LAPAROSCOPIC BILATERAL INGUINAL HERNIA REPAIR;  Surgeon: Ardeth Sportsman, MD;  Location: [facility];  Service: General;  Laterality: Bilateral;  . Insertion of mesh Bilateral 03/28/2012    Procedure: INSERTION OF MESH;  Surgeon: Ardeth Sportsman, MD;  Location: [facility];  Service: General;  Laterality: Bilateral;  . Hernia repair    . Esophagogastroduodenoscopy N/A 04/30/2012  Procedure: ESOPHAGOGASTRODUODENOSCOPY (EGD);  Surgeon: Theda Belfast, MD;  Location: [facility] ENDOSCOPY;  Service: Endoscopy;  Laterality: N/A;  . Colonoscopy N/A 05/01/2012    Procedure: COLONOSCOPY;  Surgeon: Theda Belfast, MD;  Location: [facility] ENDOSCOPY;  Service: Endoscopy;  Laterality: N/A;  . Cardiac catheterization    . Left heart catheterization with coronary angiogram N/A 02/19/2011    Procedure: LEFT HEART CATHETERIZATION WITH CORONARY ANGIOGRAM;  Surgeon: Robynn Pane, MD;  Location: [facility] CATH LAB;  Service: Cardiovascular;  Laterality: N/A;   Family History  Problem Relation Age of Onset  . Diabetes Mother   . Heart disease Father   . Lung cancer Brother   .  Stroke Neg Hx   . Hypertension Neg Hx   . Hyperlipidemia Neg Hx   . Alcohol abuse Neg Hx    History  Substance Use Topics  . Smoking status: Former Smoker -- 2.00 packs/day for 3 years    Types: Cigarettes    Start date: 01/27/1946    Quit date: 01/22/1981  . Smokeless tobacco: Never Used  . Alcohol Use: No     Comment: 07/12/2011; "last drink of alcohol was ~ 1983"    Review of Systems  Constitutional: Negative for fever.  Respiratory: Negative for shortness of breath.   Cardiovascular: Positive for chest pain ("I keep it all the time").  Gastrointestinal: Positive for abdominal pain ("for years and years and years"). Negative for nausea, vomiting and diarrhea.  All other systems reviewed and are negative.     Allergies  Review of patient's allergies indicates no known allergies.  Home Medications   Prior to Admission medications   Medication Sig Start Date End Date Taking? Authorizing Provider  acetaminophen (TYLENOL) 650 MG CR tablet Take 1,300 mg by mouth every 8 (eight) hours as needed for pain.   Yes Historical Provider, MD  amiodarone (PACERONE) 200 MG tablet Take 200 mg by mouth daily.    Yes Historical Provider, MD  atorvastatin (LIPITOR) 40 MG tablet Take 40 mg by mouth daily.   Yes Historical Provider, MD  carvedilol (COREG) 3.125 MG tablet Take 3.125 mg by mouth 2 (two) times daily with a meal. 8am, 6pm   Yes Historical Provider, MD  dorzolamide-timolol (COSOPT) 22.3-6.8 MG/ML ophthalmic solution Place 1 drop into the right eye daily.    Yes Historical Provider, MD  fluticasone (FLONASE) 50 MCG/ACT nasal spray Place 1 spray into both nostrils 2 (two) times daily.  08/13/13  Yes Historical Provider, MD  furosemide (LASIX) 40 MG tablet Take 40 mg by mouth daily.    Yes Historical Provider, MD  isosorbide mononitrate (IMDUR) 60 MG 24 hr tablet Take 60 mg by mouth daily.   Yes Historical Provider, MD  levothyroxine (SYNTHROID, LEVOTHROID) 50 MCG tablet Take 50 mcg by  mouth daily before breakfast.    Yes Historical Provider, MD  nitroGLYCERIN (NITROSTAT) 0.4 MG SL tablet Place 1 tablet (0.4 mg total) under the tongue every 5 (five) minutes as needed for chest pain. 04/24/14  Yes Garlon Hatchet, PA-C  omeprazole (PRILOSEC) 40 MG capsule Take 40 mg by mouth daily after breakfast.    Yes Historical Provider, MD  ranitidine (ZANTAC) 300 MG tablet Take 300 mg by mouth daily.  01/04/14  Yes Historical Provider, MD  spironolactone (ALDACTONE) 25 MG tablet Take 25 mg by mouth daily.   Yes Historical Provider, MD  tamsulosin (FLOMAX) 0.4 MG CAPS capsule Take 0.8 mg by mouth at bedtime.    Yes Historical Provider,  MD  warfarin (COUMADIN) 4 MG tablet Take 2-4 mg by mouth daily at 6 PM. Take 1/2 tablet (2 mg) on Sundays, and take 1 tablet (4 mg) all other days of the week   Yes Historical Provider, MD  acetaminophen (TYLENOL) 500 MG tablet Take 1,000 mg by mouth every 6 (six) hours as needed (pain).     Historical Provider, MD  HYDROcodone-acetaminophen (NORCO) 5-325 MG per tablet Take 1 tablet by mouth every 6 (six) hours as needed. Patient not taking: Reported on 07/17/2014 05/11/14   Toy Cookey, MD  ondansetron (ZOFRAN ODT) 4 MG disintegrating tablet 4mg  ODT q4 hours prn nausea/vomit 03/27/14   Purvis Sheffield, MD  ondansetron (ZOFRAN) 4 MG tablet Take 1 tablet (4 mg total) by mouth every 6 (six) hours. Patient not taking: Reported on 07/17/2014 06/04/14   Theda Belfast, MD  polyethylene glycol [facility] / Ethelene Hal) packet Take 17 g by mouth daily. Patient not taking: Reported on 07/17/2014 01/11/14   Orpah Cobb, MD  promethazine (PHENERGAN) 25 MG tablet Take 1 tablet (25 mg total) by mouth every 6 (six) hours as needed for nausea or vomiting. Patient not taking: Reported on 01/19/2014 08/20/13   Oswaldo Conroy, PA-C  traMADol (ULTRAM) 50 MG tablet Take 1 tablet (50 mg total) by mouth every 6 (six) hours as needed. Patient not taking: Reported on 07/17/2014 06/04/14   Theda Belfast, MD   BP 158/72 mmHg  Pulse 62  Temp(Src) 98.2 F (36.8 C) (Oral)  Resp 15  SpO2 99% Physical Exam  Constitutional: He is oriented to person, place, and time. He appears well-developed and well-nourished. No distress.  HENT:  Head: Normocephalic and atraumatic.  Mouth/Throat: No oropharyngeal exudate.  Eyes: EOM are normal. Pupils are equal, round, and reactive to light.  Neck: Normal range of motion. Neck supple.  Cardiovascular: Normal rate and regular rhythm.  Exam reveals no friction rub.   No murmur heard. Pulmonary/Chest: Effort normal and breath sounds normal. No respiratory distress. He has no wheezes. He has no rales.  Abdominal: Soft. He exhibits no distension. There is tenderness (suprapubic). There is no rebound.  Musculoskeletal: Normal range of motion. He exhibits no edema.  Neurological: He is alert and oriented to person, place, and time.  Skin: No rash noted. He is not diaphoretic.  Nursing note and vitals reviewed.   ED Course  Procedures (including critical care time) Labs Review Labs Reviewed  URINALYSIS, ROUTINE W REFLEX MICROSCOPIC (NOT AT [facility])  CBC  BASIC METABOLIC PANEL  I-STAT TROPOININ, ED    Imaging Review Dg Chest 2 View  07/17/2014   CLINICAL DATA:  Left-sided chest pain radiating into left arm and abdominal pain with nausea, former smoker  EXAM: CHEST  2 VIEW  COMPARISON:  04/24/2014  FINDINGS: Stable mild cardiac enlargement. Vascular pattern is normal. No consolidation effusion or pneumothorax. Minimal scarring or atelectasis at the left lung base is stable.  IMPRESSION: No active cardiopulmonary disease.   Electronically Signed   By: Esperanza Heir M.D.   On: 07/17/2014 17:05     EKG Interpretation   Date/Time:  Saturday July 17 2014 14:18:07 EDT Ventricular Rate:  64 PR Interval:  136 QRS Duration: 148 QT Interval:  462 QTC Calculation: 476 R Axis:   109 Text Interpretation:  Normal sinus rhythm Rightward axis Non-specific   intra-ventricular conduction block Abnormal ECG No significant change  since last tracing Confirmed by Gwendolyn Grant  MD, Ashawnti Tangen ([id number]) on 07/17/2014  2:58:55 PM  MDM   Final diagnoses:  Generalized abdominal pain    81 year old male here with chronic abdominal and chest pain. He is an extremely poor historian. He states he has chronic abdominal and chest pain which she's been having, "for years." He states it waxes and wanes but when asked about the waxing and waning, he says, "I keep it all the time." He denies any fever, shortness of breath, vomiting, diarrhea. He does endorse dysuria. Here vitals are stable. He is very well-appearing, ambulating easily. Check some labs at this time. EKG is similar to prior. On exam he has some mild suprapubic pain. He has had CT imaging for this previous pain 3 months ago which was normal. I do not feel he warrants imaging today. Labs similar to prior. Stable for discharge.  Elwin Mocha, MD 07/17/14 [phone]

## 2014-07-17 NOTE — ED Notes (Signed)
Pt able to ambulate after pain medications.

## 2014-07-31 ENCOUNTER — Emergency Department (HOSPITAL_COMMUNITY): Payer: Medicare Other

## 2014-07-31 ENCOUNTER — Emergency Department (HOSPITAL_COMMUNITY)
Admission: EM | Admit: 2014-07-31 | Discharge: 2014-07-31 | Disposition: A | Discharge status: Home / Self Care | Payer: Medicare Other | Attending: Emergency Medicine | Admitting: Emergency Medicine

## 2014-07-31 ENCOUNTER — Encounter (HOSPITAL_COMMUNITY): Payer: Self-pay | Admitting: Emergency Medicine

## 2014-07-31 DIAGNOSIS — Z9861 Coronary angioplasty status: Secondary | ICD-10-CM | POA: Diagnosis not present

## 2014-07-31 DIAGNOSIS — R079 Chest pain, unspecified: Secondary | ICD-10-CM | POA: Insufficient documentation

## 2014-07-31 DIAGNOSIS — R1032 Left lower quadrant pain: Secondary | ICD-10-CM | POA: Diagnosis not present

## 2014-07-31 DIAGNOSIS — I10 Essential (primary) hypertension: Secondary | ICD-10-CM | POA: Diagnosis not present

## 2014-07-31 DIAGNOSIS — I252 Old myocardial infarction: Secondary | ICD-10-CM | POA: Diagnosis not present

## 2014-07-31 DIAGNOSIS — Z7951 Long term (current) use of inhaled steroids: Secondary | ICD-10-CM | POA: Insufficient documentation

## 2014-07-31 DIAGNOSIS — H409 Unspecified glaucoma: Secondary | ICD-10-CM | POA: Insufficient documentation

## 2014-07-31 DIAGNOSIS — I480 Paroxysmal atrial fibrillation: Secondary | ICD-10-CM | POA: Insufficient documentation

## 2014-07-31 DIAGNOSIS — Z86711 Personal history of pulmonary embolism: Secondary | ICD-10-CM | POA: Insufficient documentation

## 2014-07-31 DIAGNOSIS — R350 Frequency of micturition: Secondary | ICD-10-CM | POA: Diagnosis not present

## 2014-07-31 DIAGNOSIS — K219 Gastro-esophageal reflux disease without esophagitis: Secondary | ICD-10-CM | POA: Diagnosis not present

## 2014-07-31 DIAGNOSIS — J45909 Unspecified asthma, uncomplicated: Secondary | ICD-10-CM | POA: Insufficient documentation

## 2014-07-31 DIAGNOSIS — F419 Anxiety disorder, unspecified: Secondary | ICD-10-CM | POA: Insufficient documentation

## 2014-07-31 DIAGNOSIS — R1031 Right lower quadrant pain: Secondary | ICD-10-CM | POA: Insufficient documentation

## 2014-07-31 DIAGNOSIS — Z8701 Personal history of pneumonia (recurrent): Secondary | ICD-10-CM | POA: Insufficient documentation

## 2014-07-31 DIAGNOSIS — F329 Major depressive disorder, single episode, unspecified: Secondary | ICD-10-CM | POA: Insufficient documentation

## 2014-07-31 DIAGNOSIS — Z9889 Other specified postprocedural states: Secondary | ICD-10-CM | POA: Diagnosis not present

## 2014-07-31 DIAGNOSIS — E785 Hyperlipidemia, unspecified: Secondary | ICD-10-CM | POA: Diagnosis not present

## 2014-07-31 DIAGNOSIS — Z8673 Personal history of transient ischemic attack (TIA), and cerebral infarction without residual deficits: Secondary | ICD-10-CM | POA: Diagnosis not present

## 2014-07-31 DIAGNOSIS — E119 Type 2 diabetes mellitus without complications: Secondary | ICD-10-CM | POA: Diagnosis not present

## 2014-07-31 DIAGNOSIS — Z79899 Other long term (current) drug therapy: Secondary | ICD-10-CM | POA: Diagnosis not present

## 2014-07-31 DIAGNOSIS — H9190 Unspecified hearing loss, unspecified ear: Secondary | ICD-10-CM | POA: Diagnosis not present

## 2014-07-31 DIAGNOSIS — Z86718 Personal history of other venous thrombosis and embolism: Secondary | ICD-10-CM | POA: Insufficient documentation

## 2014-07-31 DIAGNOSIS — Z87891 Personal history of nicotine dependence: Secondary | ICD-10-CM | POA: Diagnosis not present

## 2014-07-31 DIAGNOSIS — Z7901 Long term (current) use of anticoagulants: Secondary | ICD-10-CM | POA: Diagnosis not present

## 2014-07-31 DIAGNOSIS — R1030 Lower abdominal pain, unspecified: Secondary | ICD-10-CM | POA: Diagnosis present

## 2014-07-31 DIAGNOSIS — M1990 Unspecified osteoarthritis, unspecified site: Secondary | ICD-10-CM | POA: Diagnosis not present

## 2014-07-31 DIAGNOSIS — E039 Hypothyroidism, unspecified: Secondary | ICD-10-CM | POA: Insufficient documentation

## 2014-07-31 DIAGNOSIS — Z87448 Personal history of other diseases of urinary system: Secondary | ICD-10-CM | POA: Insufficient documentation

## 2014-07-31 DIAGNOSIS — G8929 Other chronic pain: Secondary | ICD-10-CM | POA: Insufficient documentation

## 2014-07-31 DIAGNOSIS — R109 Unspecified abdominal pain: Secondary | ICD-10-CM

## 2014-07-31 DIAGNOSIS — I25119 Atherosclerotic heart disease of native coronary artery with unspecified angina pectoris: Secondary | ICD-10-CM | POA: Diagnosis not present

## 2014-07-31 LAB — URINALYSIS, ROUTINE W REFLEX MICROSCOPIC
BILIRUBIN URINE: NEGATIVE
Glucose, UA: NEGATIVE mg/dL
HGB URINE DIPSTICK: NEGATIVE
Ketones, ur: NEGATIVE mg/dL
Leukocytes, UA: NEGATIVE
Nitrite: NEGATIVE
PH: 7 (ref 5.0–8.0)
Protein, ur: NEGATIVE mg/dL
Specific Gravity, Urine: 1.011 (ref 1.005–1.030)
UROBILINOGEN UA: 0.2 mg/dL (ref 0.0–1.0)

## 2014-07-31 LAB — PROTIME-INR
INR: 2.5 — AB (ref 0.00–1.49)
PROTHROMBIN TIME: 26.7 s — AB (ref 11.6–15.2)

## 2014-07-31 LAB — COMPREHENSIVE METABOLIC PANEL
ALBUMIN: 3.4 g/dL — AB (ref 3.5–5.0)
ALK PHOS: 55 U/L (ref 38–126)
ALT: 17 U/L (ref 17–63)
AST: 20 U/L (ref 15–41)
Anion gap: 7 (ref 5–15)
BILIRUBIN TOTAL: 0.7 mg/dL (ref 0.3–1.2)
BUN: 23 mg/dL — AB (ref 6–20)
CO2: 28 mmol/L (ref 22–32)
Calcium: 9.2 mg/dL (ref 8.9–10.3)
Chloride: 102 mmol/L (ref 101–111)
Creatinine, Ser: 2.04 mg/dL — ABNORMAL HIGH (ref 0.61–1.24)
GFR calc Af Amer: 33 mL/min — ABNORMAL LOW (ref >60)
GFR calc non Af Amer: 29 mL/min — ABNORMAL LOW (ref >60)
Glucose, Bld: 116 mg/dL — ABNORMAL HIGH (ref 65–99)
Potassium: 4.1 mmol/L (ref 3.5–5.1)
SODIUM: 137 mmol/L (ref 135–145)
TOTAL PROTEIN: 6.7 g/dL (ref 6.5–8.1)

## 2014-07-31 LAB — CBC WITH DIFFERENTIAL/PLATELET
BASOS PCT: 0 % (ref 0–1)
Basophils Absolute: 0 10*3/uL (ref 0.0–0.1)
EOS ABS: 0.1 10*3/uL (ref 0.0–0.7)
EOS PCT: 1 % (ref 0–5)
HCT: 37.3 % — ABNORMAL LOW (ref 39.0–52.0)
Hemoglobin: 12.7 g/dL — ABNORMAL LOW (ref 13.0–17.0)
LYMPHS ABS: 1.8 10*3/uL (ref 0.7–4.0)
Lymphocytes Relative: 27 % (ref 12–46)
MCH: 28.4 pg (ref 26.0–34.0)
MCHC: 34 g/dL (ref 30.0–36.0)
MCV: 83.4 fL (ref 78.0–100.0)
Monocytes Absolute: 0.6 10*3/uL (ref 0.1–1.0)
Monocytes Relative: 10 % (ref 3–12)
Neutro Abs: 4.1 10*3/uL (ref 1.7–7.7)
Neutrophils Relative %: 62 % (ref 43–77)
Platelets: 143 10*3/uL — ABNORMAL LOW (ref 150–400)
RBC: 4.47 MIL/uL (ref 4.22–5.81)
RDW: 15 % (ref 11.5–15.5)
WBC: 6.7 10*3/uL (ref 4.0–10.5)

## 2014-07-31 LAB — I-STAT TROPONIN, ED: TROPONIN I, POC: 0.02 ng/mL (ref 0.00–0.08)

## 2014-07-31 LAB — LIPASE, BLOOD: LIPASE: 25 U/L (ref 22–51)

## 2014-07-31 MED ORDER — HYDROCODONE-ACETAMINOPHEN 5-325 MG PO TABS
1.0000 | ORAL_TABLET | Freq: Once | ORAL | Status: AC
  Administered 2014-07-31: 1 via ORAL
  Filled 2014-07-31: qty 1

## 2014-07-31 NOTE — ED Provider Notes (Addendum)
CSN: [id number]     Arrival date & time 07/31/14  1122 History   First MD Initiated Contact with Patient 07/31/14 1132     Chief Complaint  Patient presents with  . Abdominal Pain  . Chest Pain     (Consider location/radiation/quality/duration/timing/severity/associated sxs/prior Treatment) HPI  81 year old male presents with worsening of chronic abdominal and chest pain. Patient states that he's been having lower abdominal pain for months and it has been worse for 2 weeks. Chest pain is not changed from his chronic, daily chest pain. No dyspnea, vomiting or diarrhea. Mildly nauseated. Nothing makes pain better or worse. Seen in this ER 2 weeks ago for similar symptoms. He notes he gets dizzy every day, not dizzy now and this is not different from chronic dizzines he has.  Past Medical History  Diagnosis Date  . GERD (gastroesophageal reflux disease)   . Chronic back pain   . Gout     no problems in 2 years  . Pulmonary embolism, bilateral 2010  . Hyperlipemia   . Anginal pain   . DVT, lower extremity 1980's    LLE  . Asthma     "touch"  . Pneumonia 1970's  . Arthritis     "mostly in my knees and shoulders"  . Collapsed lung     "for 14 years" right   . Diabetes mellitus   . Hypertension   . Depression   . Anxiety   . Hearing loss   . Glaucoma   . Complication of anesthesia   . PONV (postoperative nausea and vomiting)   . Renal disorder ~ 03/2011    "wasn't functioning right"  . Stroke   . Stroke     "just a little one"  . Hypothyroidism   . Chronic abdominal pain   . Paroxysmal atrial fibrillation   . Myocardial infarction 1987; 1989  . Coronary artery disease    Past Surgical History  Procedure Laterality Date  . Esophagogastroduodenoscopy  12/22/2010    Procedure: ESOPHAGOGASTRODUODENOSCOPY (EGD);  Surgeon: Theda BelfastPatrick D Hung;  Location: [facility] ENDOS[facility];  Service: Endoscopy;  Laterality: N/A;  . Appendectomy  1950  . Cataract extraction w/ intraocular lens  implant,  bilateral Bilateral 1990's  . Coronary angioplasty with stent placement      "think I have 4 total"  . Inguinal hernia repair Bilateral 03/28/2012    Procedure: LAPAROSCOPIC BILATERAL INGUINAL HERNIA REPAIR;  Surgeon: Ardeth SportsmanSteven C. Gross, MD;  Location: [facility];  Service: General;  Laterality: Bilateral;  . Insertion of mesh Bilateral 03/28/2012    Procedure: INSERTION OF MESH;  Surgeon: Ardeth SportsmanSteven C. Gross, MD;  Location: [facility] ORS;  Service: General;  Laterality: Bilateral;  . Hernia repair    . Esophagogastroduodenoscopy N/A 04/30/2012    Procedure: ESOPHAGOGASTRODUODENOSCOPY (EGD);  Surgeon: Theda BelfastPatrick D Hung, MD;  [facility];  Service: Endoscopy;  Laterality: N/A;  . Colonoscopy N/A 05/01/2012    Procedure: COLONOSCOPY;  Surgeon: Theda BelfastPatrick D Hung, MD;  [facility];  Service: Endoscopy;  Laterality: N/A;  . Cardiac catheterization    . Left heart catheterization with coronary angiogram N/A 02/19/2011    Procedure: LEFT HEART CATHETERIZATION WITH CORONARY ANGIOGRAM;  Surgeon: Robynn PaneMohan N Harwani, MD;  [facility];  Service: Cardiovascular;  Laterality: N/A;   Family History  Problem Relation Age of Onset  . Diabetes Mother   . Heart disease Father   . Lung cancer Brother   . Stroke Neg Hx   . Hypertension Neg Hx   .  Hyperlipidemia Neg Hx   . Alcohol abuse Neg Hx    History  Substance Use Topics  . Smoking status: Former Smoker -- 2.00 packs/day for 3 years    Types: Cigarettes    Start date: 01/27/1946    Quit date: 01/22/1981  . Smokeless tobacco: Never Used  . Alcohol Use: No     Comment: 07/12/2011; "last drink of alcohol was ~ 1983"    Review of Systems  Constitutional: Negative for fever.  Respiratory: Negative for cough and shortness of breath.   Cardiovascular: Positive for chest pain.  Gastrointestinal: Positive for abdominal pain. Negative for vomiting and diarrhea.  Genitourinary: Positive for frequency. Negative for dysuria.  Neurological: Positive for  dizziness.  All other systems reviewed and are negative.     Allergies  Review of patient's allergies indicates no known allergies.  Home Medications   Prior to Admission medications   Medication Sig Start Date End Date Taking? Authorizing Provider  acetaminophen (TYLENOL) 500 MG tablet Take 1,000 mg by mouth every 6 (six) hours as needed (pain).     Historical Provider, MD  acetaminophen (TYLENOL) 650 MG CR tablet Take 1,300 mg by mouth every 8 (eight) hours as needed for pain.    Historical Provider, MD  amiodarone (PACERONE) 200 MG tablet Take 200 mg by mouth daily.     Historical Provider, MD  atorvastatin (LIPITOR) 40 MG tablet Take 40 mg by mouth daily.    Historical Provider, MD  carvedilol (COREG) 3.125 MG tablet Take 3.125 mg by mouth 2 (two) times daily with a meal. 8am, 6pm    Historical Provider, MD  dorzolamide-timolol (COSOPT) 22.3-6.8 MG/ML ophthalmic solution Place 1 drop into the right eye daily.     Historical Provider, MD  fluticasone (FLONASE) 50 MCG/ACT nasal spray Place 1 spray into both nostrils 2 (two) times daily.  08/13/13   Historical Provider, MD  furosemide (LASIX) 40 MG tablet Take 40 mg by mouth daily.     Historical Provider, MD  HYDROcodone-acetaminophen (NORCO) 5-325 MG per tablet Take 1 tablet by mouth every 6 (six) hours as needed. Patient not taking: Reported on 07/17/2014 05/11/14   Toy Cookey, MD  isosorbide mononitrate (IMDUR) 60 MG 24 hr tablet Take 60 mg by mouth daily.    Historical Provider, MD  levothyroxine (SYNTHROID, LEVOTHROID) 50 MCG tablet Take 50 mcg by mouth daily before breakfast.     Historical Provider, MD  nitroGLYCERIN (NITROSTAT) 0.4 MG SL tablet Place 1 tablet (0.4 mg total) under the tongue every 5 (five) minutes as needed for chest pain. 04/24/14   Garlon Hatchet, PA-C  omeprazole (PRILOSEC) 40 MG capsule Take 40 mg by mouth daily after breakfast.     Historical Provider, MD  ondansetron (ZOFRAN ODT) 4 MG disintegrating tablet  4mg  ODT q4 hours prn nausea/vomit 03/27/14   Purvis Sheffield, MD  ondansetron (ZOFRAN) 4 MG tablet Take 1 tablet (4 mg total) by mouth every 6 (six) hours. Patient not taking: Reported on 07/17/2014 06/04/14   Theda Belfast, MD  polyethylene glycol [facility] / Ethelene Hal) packet Take 17 g by mouth daily. Patient not taking: Reported on 07/17/2014 01/11/14   Orpah Cobb, MD  promethazine (PHENERGAN) 25 MG tablet Take 1 tablet (25 mg total) by mouth every 6 (six) hours as needed for nausea or vomiting. Patient not taking: Reported on 01/19/2014 08/20/13   Oswaldo Conroy, PA-C  ranitidine (ZANTAC) 300 MG tablet Take 300 mg by mouth daily.  01/04/14   Historical Provider,  MD  spironolactone (ALDACTONE) 25 MG tablet Take 25 mg by mouth daily.    Historical Provider, MD  tamsulosin (FLOMAX) 0.4 MG CAPS capsule Take 0.8 mg by mouth at bedtime.     Historical Provider, MD  traMADol (ULTRAM) 50 MG tablet Take 1 tablet (50 mg total) by mouth every 6 (six) hours as needed. Patient not taking: Reported on 07/17/2014 06/04/14   Theda Belfast, MD  warfarin (COUMADIN) 4 MG tablet Take 2-4 mg by mouth daily at 6 PM. Take 1/2 tablet (2 mg) on Sundays, and take 1 tablet (4 mg) all other days of the week    Historical Provider, MD   BP 163/65 mmHg  Pulse 56  Temp(Src) 98.1 F (36.7 C) (Oral)  Resp 18  SpO2 97% Physical Exam  Constitutional: He is oriented to person, place, and time. He appears well-developed and well-nourished.  HENT:  Head: Normocephalic and atraumatic.  Right Ear: External ear normal.  Left Ear: External ear normal.  Nose: Nose normal.  Eyes: Right eye exhibits no discharge. Left eye exhibits no discharge.  Neck: Neck supple.  Cardiovascular: Normal rate, regular rhythm, normal heart sounds and intact distal pulses.   Pulmonary/Chest: Effort normal. He exhibits tenderness.  Abdominal: Soft. There is tenderness in the right lower quadrant and left lower quadrant. There is no rigidity, no  rebound and no guarding.  Mild tenderness in lower abdomen, however when patient is distracted there is no production of pain on palpation  Musculoskeletal: He exhibits no edema.  Neurological: He is alert and oriented to person, place, and time.  Skin: Skin is warm and dry.  Nursing note and vitals reviewed.   ED Course  Procedures (including critical care time) Labs Review Labs Reviewed  CBC WITH DIFFERENTIAL/PLATELET - Abnormal; Notable for the following:    Hemoglobin 12.7 (*)    HCT 37.3 (*)    Platelets 143 (*)    All other components within normal limits  COMPREHENSIVE METABOLIC PANEL - Abnormal; Notable for the following:    Glucose, Bld 116 (*)    BUN 23 (*)    Creatinine, Ser 2.04 (*)    Albumin 3.4 (*)    GFR calc non Af Amer 29 (*)    GFR calc Af Amer 33 (*)    All other components within normal limits  PROTIME-INR - Abnormal; Notable for the following:    Prothrombin Time 26.7 (*)    INR 2.50 (*)    All other components within normal limits  LIPASE, BLOOD  URINALYSIS, ROUTINE W REFLEX MICROSCOPIC (NOT AT [facility])  Rosezena Sensor, ED    Imaging Review Dg Chest 2 View  07/31/2014   CLINICAL DATA:  Chest pain  EXAM: CHEST  2 VIEW  COMPARISON:  07/17/2014  FINDINGS: Lungs are clear.  No pleural effusion or pneumothorax.  Cardiomegaly.  Degenerative changes of the visualized thoracolumbar spine.  IMPRESSION: No evidence of acute cardiopulmonary disease.   Electronically Signed   By: Charline Bills M.D.   On: 07/31/2014 13:01     EKG Interpretation   Date/Time:  Saturday July 31 2014 11:31:35 EDT Ventricular Rate:  59 PR Interval:  142 QRS Duration: 164 QT Interval:  477 QTC Calculation: 473 R Axis:   -74 Text Interpretation:  Sinus rhythm Left bundle branch block no significant  change since Jul 17 2014 Confirmed by Criss Alvine  MD, Darlys Buis ([id number]) on  07/31/2014 11:55:11 AM      MDM   Final diagnoses:  Chronic abdominal  pain    Patient's symptoms all  seem chronic. While he does have a past history of DVT/PE, he is not tachycardic, hypoxia, and INR is therapeutic. Highly doubt PE. As for his abdomen, when distracted, I find no reproducible tenderness and with no significant change in chronic symptoms I do not feel CT imaging would be beneficial. Appears well here, vital signs unremarkable except some hypertension. Stable for d/c home to f/u with PCP    Pricilla Loveless, MD 07/31/14 1610  Pricilla Loveless, MD 07/31/14 [phone]

## 2014-07-31 NOTE — Discharge Instructions (Signed)
Chronic Pain °Chronic pain can be defined as pain that is off and on and lasts for 3-6 months or longer. Many things cause chronic pain, which can make it difficult to make a diagnosis. There are many treatment options available for chronic pain. However, finding a treatment that works well for you may require trying various approaches until the right one is found. Many people benefit from a combination of two or more types of treatment to control their pain. °SYMPTOMS  °Chronic pain can occur anywhere in the body and can range from mild to very severe. Some types of chronic pain include: °· Headache. °· Low back pain. °· Cancer pain. °· Arthritis pain. °· Neurogenic pain. This is pain resulting from damage to nerves. ° People with chronic pain may also have other symptoms such as: °· Depression. °· Anger. °· Insomnia. °· Anxiety. °DIAGNOSIS  °Your health care provider will help diagnose your condition over time. In many cases, the initial focus will be on excluding possible conditions that could be causing the pain. Depending on your symptoms, your health care provider may order tests to diagnose your condition. Some of these tests may include:  °· Blood tests.   °· CT scan.   °· MRI.   °· X-rays.   °· Ultrasounds.   °· Nerve conduction studies.   °You may need to see a specialist.  °TREATMENT  °Finding treatment that works well may take time. You may be referred to a pain specialist. He or she may prescribe medicine or therapies, such as:  °· Mindful meditation or yoga. °· Shots (injections) of numbing or pain-relieving medicines into the spine or area of pain. °· Local electrical stimulation. °· Acupuncture.   °· Massage therapy.   °· Aroma, color, light, or sound therapy.   °· Biofeedback.   °· Working with a physical therapist to keep from getting stiff.   °· Regular, gentle exercise.   °· Cognitive or behavioral therapy.   °· Group support.   °Sometimes, surgery may be recommended.  °HOME CARE INSTRUCTIONS   °· Take all medicines as directed by your health care provider.   °· Lessen stress in your life by relaxing and doing things such as listening to calming music.   °· Exercise or be active as directed by your health care provider.   °· Eat a healthy diet and include things such as vegetables, fruits, fish, and lean meats in your diet.   °· Keep all follow-up appointments with your health care provider.   °· Attend a support group with others suffering from chronic pain. °SEEK MEDICAL CARE IF:  °· Your pain gets worse.   °· You develop a new pain that was not there before.   °· You cannot tolerate medicines given to you by your health care provider.   °· You have new symptoms since your last visit with your health care provider.   °SEEK IMMEDIATE MEDICAL CARE IF:  °· You feel weak.   °· You have decreased sensation or numbness.   °· You lose control of bowel or bladder function.   °· Your pain suddenly gets much worse.   °· You develop shaking. °· You develop chills. °· You develop confusion. °· You develop chest pain. °· You develop shortness of breath.   °MAKE SURE YOU: °· Understand these instructions. °· Will watch your condition. °· Will get help right away if you are not doing well or get worse. °Document Released: 09/30/2001 Document Revised: 09/10/2012 Document Reviewed: 07/04/2012 °[employer]® Patient Information ©2015 [employer]. This information is not intended to replace advice given to you by your health care provider. Make sure you discuss any   questions you have with your health care provider. ° °Abdominal Pain °Many things can cause abdominal pain. Usually, abdominal pain is not caused by a disease and will improve without treatment. It can often be observed and treated at home. Your health care provider will do a physical exam and possibly order blood tests and X-rays to help determine the seriousness of your pain. However, in many cases, more time must pass before a clear cause of the pain can be  found. Before that point, your health care provider may not know if you need more testing or further treatment. °HOME CARE INSTRUCTIONS  °Monitor your abdominal pain for any changes. The following actions may help to alleviate any discomfort you are experiencing: °· Only take over-the-counter or prescription medicines as directed by your health care provider. °· Do not take laxatives unless directed to do so by your health care provider. °· Try a clear liquid diet (broth, tea, or water) as directed by your health care provider. Slowly move to a bland diet as tolerated. °SEEK MEDICAL CARE IF: °· You have unexplained abdominal pain. °· You have abdominal pain associated with nausea or diarrhea. °· You have pain when you urinate or have a bowel movement. °· You experience abdominal pain that wakes you in the night. °· You have abdominal pain that is worsened or improved by eating food. °· You have abdominal pain that is worsened with eating fatty foods. °· You have a fever. °SEEK IMMEDIATE MEDICAL CARE IF:  °· Your pain does not go away within 2 hours. °· You keep throwing up (vomiting). °· Your pain is felt only in portions of the abdomen, such as the right side or the left lower portion of the abdomen. °· You pass bloody or black tarry stools. °MAKE SURE YOU: °· Understand these instructions.   °· Will watch your condition.   °· Will get help right away if you are not doing well or get worse.   °Document Released: 10/18/2004 Document Revised: 01/13/2013 Document Reviewed: 09/17/2012 °[employer]® Patient Information ©2015 [employer]. This information is not intended to replace advice given to you by your health care provider. Make sure you discuss any questions you have with your health care provider. ° °

## 2014-07-31 NOTE — ED Notes (Signed)
Pt from home via [facility] with c/o RLQ pain radiating to his left chest.  Chest pain worse with palpation.  Pt reports nausea, given 4 mg Zofran.  Pt reports 10/10 pain.  Smiling on arrival, poor historian.  Pt in NAD, alert.

## 2014-08-06 ENCOUNTER — Emergency Department (HOSPITAL_COMMUNITY)
Admission: EM | Admit: 2014-08-06 | Discharge: 2014-08-06 | Disposition: A | Discharge status: Home / Self Care | Payer: Medicare Other | Attending: Emergency Medicine | Admitting: Emergency Medicine

## 2014-08-06 ENCOUNTER — Encounter (HOSPITAL_COMMUNITY): Payer: Self-pay | Admitting: *Deleted

## 2014-08-06 DIAGNOSIS — J45909 Unspecified asthma, uncomplicated: Secondary | ICD-10-CM | POA: Insufficient documentation

## 2014-08-06 DIAGNOSIS — E039 Hypothyroidism, unspecified: Secondary | ICD-10-CM | POA: Insufficient documentation

## 2014-08-06 DIAGNOSIS — F329 Major depressive disorder, single episode, unspecified: Secondary | ICD-10-CM | POA: Diagnosis not present

## 2014-08-06 DIAGNOSIS — K219 Gastro-esophageal reflux disease without esophagitis: Secondary | ICD-10-CM | POA: Insufficient documentation

## 2014-08-06 DIAGNOSIS — E119 Type 2 diabetes mellitus without complications: Secondary | ICD-10-CM | POA: Diagnosis not present

## 2014-08-06 DIAGNOSIS — Z86718 Personal history of other venous thrombosis and embolism: Secondary | ICD-10-CM | POA: Insufficient documentation

## 2014-08-06 DIAGNOSIS — K5900 Constipation, unspecified: Secondary | ICD-10-CM | POA: Diagnosis present

## 2014-08-06 DIAGNOSIS — G8929 Other chronic pain: Secondary | ICD-10-CM | POA: Insufficient documentation

## 2014-08-06 DIAGNOSIS — Z9889 Other specified postprocedural states: Secondary | ICD-10-CM | POA: Insufficient documentation

## 2014-08-06 DIAGNOSIS — Z8673 Personal history of transient ischemic attack (TIA), and cerebral infarction without residual deficits: Secondary | ICD-10-CM | POA: Diagnosis not present

## 2014-08-06 DIAGNOSIS — N189 Chronic kidney disease, unspecified: Secondary | ICD-10-CM | POA: Insufficient documentation

## 2014-08-06 DIAGNOSIS — H409 Unspecified glaucoma: Secondary | ICD-10-CM | POA: Insufficient documentation

## 2014-08-06 DIAGNOSIS — Z8701 Personal history of pneumonia (recurrent): Secondary | ICD-10-CM | POA: Diagnosis not present

## 2014-08-06 DIAGNOSIS — I25119 Atherosclerotic heart disease of native coronary artery with unspecified angina pectoris: Secondary | ICD-10-CM | POA: Diagnosis not present

## 2014-08-06 DIAGNOSIS — I129 Hypertensive chronic kidney disease with stage 1 through stage 4 chronic kidney disease, or unspecified chronic kidney disease: Secondary | ICD-10-CM | POA: Diagnosis not present

## 2014-08-06 DIAGNOSIS — Z86711 Personal history of pulmonary embolism: Secondary | ICD-10-CM | POA: Insufficient documentation

## 2014-08-06 DIAGNOSIS — I252 Old myocardial infarction: Secondary | ICD-10-CM | POA: Diagnosis not present

## 2014-08-06 DIAGNOSIS — Z7901 Long term (current) use of anticoagulants: Secondary | ICD-10-CM | POA: Diagnosis not present

## 2014-08-06 DIAGNOSIS — M1990 Unspecified osteoarthritis, unspecified site: Secondary | ICD-10-CM | POA: Diagnosis not present

## 2014-08-06 DIAGNOSIS — Z79899 Other long term (current) drug therapy: Secondary | ICD-10-CM | POA: Diagnosis not present

## 2014-08-06 DIAGNOSIS — F419 Anxiety disorder, unspecified: Secondary | ICD-10-CM | POA: Diagnosis not present

## 2014-08-06 DIAGNOSIS — I480 Paroxysmal atrial fibrillation: Secondary | ICD-10-CM | POA: Diagnosis not present

## 2014-08-06 DIAGNOSIS — Z7951 Long term (current) use of inhaled steroids: Secondary | ICD-10-CM | POA: Insufficient documentation

## 2014-08-06 DIAGNOSIS — Z9861 Coronary angioplasty status: Secondary | ICD-10-CM | POA: Diagnosis not present

## 2014-08-06 DIAGNOSIS — E785 Hyperlipidemia, unspecified: Secondary | ICD-10-CM | POA: Insufficient documentation

## 2014-08-06 DIAGNOSIS — Z87891 Personal history of nicotine dependence: Secondary | ICD-10-CM | POA: Diagnosis not present

## 2014-08-06 DIAGNOSIS — H9190 Unspecified hearing loss, unspecified ear: Secondary | ICD-10-CM | POA: Diagnosis not present

## 2014-08-06 LAB — CBC
HEMATOCRIT: 37.3 % — AB (ref 39.0–52.0)
HEMOGLOBIN: 12.8 g/dL — AB (ref 13.0–17.0)
MCH: 28.4 pg (ref 26.0–34.0)
MCHC: 34.3 g/dL (ref 30.0–36.0)
MCV: 82.9 fL (ref 78.0–100.0)
PLATELETS: 132 10*3/uL — AB (ref 150–400)
RBC: 4.5 MIL/uL (ref 4.22–5.81)
RDW: 14.8 % (ref 11.5–15.5)
WBC: 6.1 10*3/uL (ref 4.0–10.5)

## 2014-08-06 LAB — COMPREHENSIVE METABOLIC PANEL
ALBUMIN: 3.5 g/dL (ref 3.5–5.0)
ALT: 16 U/L — ABNORMAL LOW (ref 17–63)
AST: 20 U/L (ref 15–41)
Alkaline Phosphatase: 54 U/L (ref 38–126)
Anion gap: 8 (ref 5–15)
BUN: 26 mg/dL — ABNORMAL HIGH (ref 6–20)
CALCIUM: 9.2 mg/dL (ref 8.9–10.3)
CO2: 23 mmol/L (ref 22–32)
Chloride: 104 mmol/L (ref 101–111)
Creatinine, Ser: 2.21 mg/dL — ABNORMAL HIGH (ref 0.61–1.24)
GFR calc non Af Amer: 26 mL/min — ABNORMAL LOW (ref >60)
GFR, EST AFRICAN AMERICAN: 30 mL/min — AB (ref >60)
Glucose, Bld: 114 mg/dL — ABNORMAL HIGH (ref 65–99)
Potassium: 4.5 mmol/L (ref 3.5–5.1)
Sodium: 135 mmol/L (ref 135–145)
Total Bilirubin: 0.5 mg/dL (ref 0.3–1.2)
Total Protein: 6.5 g/dL (ref 6.5–8.1)

## 2014-08-06 LAB — LIPASE, BLOOD: Lipase: 30 U/L (ref 22–51)

## 2014-08-06 NOTE — ED Notes (Signed)
Pt states that he has had abdominal pain and constipation with small BM yesterday. Pt states that he has been seen here for the same recently. Pt states that he has hernias as well.

## 2014-08-06 NOTE — ED Provider Notes (Addendum)
CSN: [id number]     Arrival date & time 08/06/14  1003 History   First MD Initiated Contact with Patient 08/06/14 1020     Chief Complaint  Patient presents with  . Constipation     (Consider location/radiation/quality/duration/timing/severity/associated sxs/prior Treatment) Patient is a 81 y.o. male presenting with constipation. The history is provided by the patient. The history is limited by the condition of the patient.  Constipation Associated symptoms: no abdominal pain, no back pain, no dysuria, no fever, no nausea and no vomiting   Patient with hx htn, CRI, CVA, presents c/o feeling constipated. Mild to moderate. States he last had a full, large bm 2 days ago, has passed small stool today. No abdominal distension. No nv. Normal appetite. No dysuria or gu c/o. No fever or chills. No scrotal or testicular pain, states urinating without difficulty.  States intermittently had mild crampy pain right lower abdomen, no acute or abrupt worsening today. Pt reports prior neg colonoscopy in past, Dr Elnoria Howard.       Past Medical History  Diagnosis Date  . GERD (gastroesophageal reflux disease)   . Chronic back pain   . Gout     no problems in 2 years  . Pulmonary embolism, bilateral 2010  . Hyperlipemia   . Anginal pain   . DVT, lower extremity 1980's    LLE  . Asthma     "touch"  . Pneumonia 1970's  . Arthritis     "mostly in my knees and shoulders"  . Collapsed lung     "for 14 years" right   . Diabetes mellitus   . Hypertension   . Depression   . Anxiety   . Hearing loss   . Glaucoma   . Complication of anesthesia   . PONV (postoperative nausea and vomiting)   . Renal disorder ~ 03/2011    "wasn't functioning right"  . Stroke   . Stroke     "just a little one"  . Hypothyroidism   . Chronic abdominal pain   . Paroxysmal atrial fibrillation   . Myocardial infarction 1987; 1989  . Coronary artery disease    Past Surgical History  Procedure Laterality Date  .  Esophagogastroduodenoscopy  12/22/2010    Procedure: ESOPHAGOGASTRODUODENOSCOPY (EGD);  Surgeon: Theda Belfast;  Location: [facility] END[facility];  Service: Endoscopy;  Laterality: N/A;  . Appendectomy  1950  . Cataract extraction w/ intraocular lens  implant, bilateral Bilateral 1990's  . Coronary angioplasty with stent placement      "think I have 4 total"  . Inguinal hernia repair Bilateral 03/28/2012    Procedure: LAPAROSCOPIC BILATERAL INGUINAL HERNIA REPAIR;  Surgeon: Ardeth Sportsman, MD;  Location: [facility];  Service: General;  Laterality: Bilateral;  . Insertion of mesh Bilateral 03/28/2012    Procedure: INSERTION OF MESH;  Surgeon: Ardeth Sportsman, MD;  Location: [facility];  Service: General;  Laterality: Bilateral;  . Hernia repair    . Esophagogastroduodenoscopy N/A 04/30/2012    Procedure: ESOPHAGOGASTRODUODENOSCOPY (EGD);  Surgeon: Theda Belfast, MD;  Location: [facility] ENDOSCOPY;  Service: Endoscopy;  Laterality: N/A;  . Colonoscopy N/A 05/01/2012    Procedure: COLONOSCOPY;  Surgeon: Theda Belfast, MD;  Location: [facility] ENDOSCOPY;  Service: Endoscopy;  Laterality: N/A;  . Cardiac catheterization    . Left heart catheterization with coronary angiogram N/A 02/19/2011    Procedure: LEFT HEART CATHETERIZATION WITH CORONARY ANGIOGRAM;  Surgeon: Robynn Pane, MD;  Location: [facility] CATH LAB;  Service: Cardiovascular;  Laterality: N/A;  Family History  Problem Relation Age of Onset  . Diabetes Mother   . Heart disease Father   . Lung cancer Brother   . Stroke Neg Hx   . Hypertension Neg Hx   . Hyperlipidemia Neg Hx   . Alcohol abuse Neg Hx    History  Substance Use Topics  . Smoking status: Former Smoker -- 2.00 packs/day for 3 years    Types: Cigarettes    Start date: 01/27/1946    Quit date: 01/22/1981  . Smokeless tobacco: Never Used  . Alcohol Use: No     Comment: 07/12/2011; "last drink of alcohol was ~ 1983"    Review of Systems  Constitutional: Negative for fever and chills.  HENT: Negative  for sore throat.   Eyes: Negative for redness.  Respiratory: Negative for cough and shortness of breath.   Cardiovascular: Negative for chest pain.  Gastrointestinal: Positive for constipation. Negative for nausea, vomiting, abdominal pain and abdominal distention.  Endocrine: Negative for polyuria.  Genitourinary: Negative for dysuria and flank pain.  Musculoskeletal: Negative for back pain and neck pain.  Skin: Negative for rash.  Neurological: Negative for headaches.  Hematological: Does not bruise/bleed easily.  Psychiatric/Behavioral: Negative for confusion.      Allergies  Review of patient's allergies indicates no known allergies.  Home Medications   Prior to Admission medications   Medication Sig Start Date End Date Taking? Authorizing Provider  acetaminophen (TYLENOL) 500 MG tablet Take 1,000 mg by mouth every 6 (six) hours as needed (pain).     Historical Provider, MD  acetaminophen (TYLENOL) 650 MG CR tablet Take 1,300 mg by mouth every 8 (eight) hours as needed for pain.    Historical Provider, MD  amiodarone (PACERONE) 200 MG tablet Take 200 mg by mouth daily.     Historical Provider, MD  atorvastatin (LIPITOR) 40 MG tablet Take 40 mg by mouth daily.    Historical Provider, MD  carvedilol (COREG) 3.125 MG tablet Take 3.125 mg by mouth 2 (two) times daily with a meal. 8am, 6pm    Historical Provider, MD  dorzolamide-timolol (COSOPT) 22.3-6.8 MG/ML ophthalmic solution Place 1 drop into the right eye daily.     Historical Provider, MD  fluticasone (FLONASE) 50 MCG/ACT nasal spray Place 1 spray into both nostrils 2 (two) times daily.  08/13/13   Historical Provider, MD  furosemide (LASIX) 40 MG tablet Take 40 mg by mouth daily.     Historical Provider, MD  HYDROcodone-acetaminophen (NORCO) 5-325 MG per tablet Take 1 tablet by mouth every 6 (six) hours as needed. Patient not taking: Reported on 07/17/2014 05/11/14   Toy Cookey, MD  isosorbide mononitrate (IMDUR) 60 MG 24 hr  tablet Take 60 mg by mouth daily.    Historical Provider, MD  levothyroxine (SYNTHROID, LEVOTHROID) 50 MCG tablet Take 50 mcg by mouth daily before breakfast.     Historical Provider, MD  nitroGLYCERIN (NITROSTAT) 0.4 MG SL tablet Place 1 tablet (0.4 mg total) under the tongue every 5 (five) minutes as needed for chest pain. 04/24/14   Garlon Hatchet, PA-C  omeprazole (PRILOSEC) 40 MG capsule Take 40 mg by mouth daily after breakfast.     Historical Provider, MD  ondansetron (ZOFRAN ODT) 4 MG disintegrating tablet 4mg  ODT q4 hours prn nausea/vomit 03/27/14   Purvis Sheffield, MD  ondansetron (ZOFRAN) 4 MG tablet Take 1 tablet (4 mg total) by mouth every 6 (six) hours. Patient not taking: Reported on 07/17/2014 06/04/14   Theda Belfast, MD  polyethylene glycol (MIRALAX / GLYCOLAX) packet Take 17 g by mouth daily. Patient not taking: Reported on 07/17/2014 01/11/14   Orpah Cobb, MD  promethazine (PHENERGAN) 25 MG tablet Take 1 tablet (25 mg total) by mouth every 6 (six) hours as needed for nausea or vomiting. Patient not taking: Reported on 01/19/2014 08/20/13   Oswaldo Conroy, PA-C  ranitidine (ZANTAC) 300 MG tablet Take 300 mg by mouth daily.  01/04/14   Historical Provider, MD  spironolactone (ALDACTONE) 25 MG tablet Take 25 mg by mouth daily.    Historical Provider, MD  tamsulosin (FLOMAX) 0.4 MG CAPS capsule Take 0.8 mg by mouth at bedtime.     Historical Provider, MD  traMADol (ULTRAM) 50 MG tablet Take 1 tablet (50 mg total) by mouth every 6 (six) hours as needed. Patient not taking: Reported on 07/17/2014 06/04/14   Theda Belfast, MD  warfarin (COUMADIN) 4 MG tablet Take 2-4 mg by mouth daily at 6 PM. Take 1/2 tablet (2 mg) on Sundays, and take 1 tablet (4 mg) all other days of the week    Historical Provider, MD   BP 149/64 mmHg  Pulse 67  Temp(Src) 98.4 F (36.9 C) (Oral)  Resp 16  SpO2 97% Physical Exam  Constitutional: He appears well-developed and well-nourished. No distress.  HENT:   Mouth/Throat: Oropharynx is clear and moist.  Eyes: Conjunctivae are normal. No scleral icterus.  Neck: Neck supple. No tracheal deviation present.  Cardiovascular: Normal rate, regular rhythm, normal heart sounds and intact distal pulses.   Pulmonary/Chest: Effort normal and breath sounds normal. No accessory muscle usage. No respiratory distress.  Abdominal: Soft. Bowel sounds are normal. He exhibits no distension and no mass. There is no tenderness. There is no rebound and no guarding.  No incarcerated hernia.   Genitourinary:  No cva tenderness. Normal ext gen. No testicular/scrotal pain, swelling or tenderness.  Soft, yellow brown stool, no impaction, heme neg. No mass felt.   Musculoskeletal: Normal range of motion. He exhibits no edema or tenderness.  Neurological: He is alert.  Skin: Skin is warm and dry. No rash noted. He is not diaphoretic.  Psychiatric: He has a normal mood and affect.  Nursing note and vitals reviewed.   ED Course  Procedures (including critical care time) Labs Review   Results for orders placed or performed during the hospital encounter of 08/06/14  Lipase, blood  Result Value Ref Range   Lipase 30 22 - 51 U/L  Comprehensive metabolic panel  Result Value Ref Range   Sodium 135 135 - 145 mmol/L   Potassium 4.5 3.5 - 5.1 mmol/L   Chloride 104 101 - 111 mmol/L   CO2 23 22 - 32 mmol/L   Glucose, Bld 114 (H) 65 - 99 mg/dL   BUN 26 (H) 6 - 20 mg/dL   Creatinine, Ser 5.62 (H) 0.61 - 1.24 mg/dL   Calcium 9.2 8.9 - 13.0 mg/dL   Total Protein 6.5 6.5 - 8.1 g/dL   Albumin 3.5 3.5 - 5.0 g/dL   AST 20 15 - 41 U/L   ALT 16 (L) 17 - 63 U/L   Alkaline Phosphatase 54 38 - 126 U/L   Total Bilirubin 0.5 0.3 - 1.2 mg/dL   GFR calc non Af Amer 26 (L) >60 mL/min   GFR calc Af Amer 30 (L) >60 mL/min   Anion gap 8 5 - 15  CBC  Result Value Ref Range   WBC 6.1 4.0 - 10.5 K/uL   RBC  4.50 4.22 - 5.81 MIL/uL   Hemoglobin 12.8 (L) 13.0 - 17.0 g/dL   HCT 16.137.3 (L)  09.639.0 - 52.0 %   MCV 82.9 78.0 - 100.0 fL   MCH 28.4 26.0 - 34.0 pg   MCHC 34.3 30.0 - 36.0 g/dL   RDW 04.514.8 40.911.5 - 81.115.5 %   Platelets 132 (L) 150 - 400 K/uL   Dg Chest 2 View  07/31/2014   CLINICAL DATA:  Chest pain  EXAM: CHEST  2 VIEW  COMPARISON:  07/17/2014  FINDINGS: Lungs are clear.  No pleural effusion or pneumothorax.  Cardiomegaly.  Degenerative changes of the visualized thoracolumbar spine.  IMPRESSION: No evidence of acute cardiopulmonary disease.   Electronically Signed   By: Charline BillsSriyesh  Krishnan M.D.   On: 07/31/2014 13:01   Dg Chest 2 View  07/17/2014   CLINICAL DATA:  Left-sided chest pain radiating into left arm and abdominal pain with nausea, former smoker  EXAM: CHEST  2 VIEW  COMPARISON:  04/24/2014  FINDINGS: Stable mild cardiac enlargement. Vascular pattern is normal. No consolidation effusion or pneumothorax. Minimal scarring or atelectasis at the left lung base is stable.  IMPRESSION: No active cardiopulmonary disease.   Electronically Signed   By: Esperanza Heiraymond  Rubner M.D.   On: 07/17/2014 17:05      MDM   Nursing sent labs from triage, labs c/w prior.  Reviewed nursing notes and prior charts for additional history.   Pt with recent prior imaging/cts for same pain, neg for acute process.   Hx chronic renal insuff, similar to prior, will have f/u pcp.  Pt afeb. abd exam soft with no tenderness. No pain. No nv.  Pt currently appears stable for d/c.  Return precautions provided.     Cathren LaineKevin Alee Katen, MD 08/06/14 [phone]

## 2014-08-06 NOTE — ED Notes (Signed)
Son called for transportation back home. Made aware that pt will be in wheel chair near main entrance.

## 2014-08-06 NOTE — Discharge Instructions (Signed)
It was our pleasure to provide your ER care today - we hope that you feel better.  If constipated, drink adequate fluids, get adequate fiber in diet.  You may also try colace (stool softener) and miralax (laxative), as need - both medications as available over the counter.  Follow up with primary care doctor in the coming week for recheck.  From today's lab tests, your kidney function test (creatnine 2.2) is similar to prior, but slightly more elevated - avoid taking any anti-inflammatory pain medication such as ibuprofen/motrin/naprosyn/aleve, and follow up with primary care doctor in coming week.   Return to ER if worse, new symptoms, fevers, worsening or severe abdominal pain, persistent vomiting, other concern.     Constipation Constipation is when a person has fewer than three bowel movements a week, has difficulty having a bowel movement, or has stools that are dry, hard, or larger than normal. As people grow older, constipation is more common. If you try to fix constipation with medicines that make you have a bowel movement (laxatives), the problem may get worse. Long-term laxative use may cause the muscles of the colon to become weak. A low-fiber diet, not taking in enough fluids, and taking certain medicines may make constipation worse.  CAUSES   Certain medicines, such as antidepressants, pain medicine, iron supplements, antacids, and water pills.   Certain diseases, such as diabetes, irritable bowel syndrome (IBS), thyroid disease, or depression.   Not drinking enough water.   Not eating enough fiber-rich foods.   Stress or travel.   Lack of physical activity or exercise.   Ignoring the urge to have a bowel movement.   Using laxatives too much.  SIGNS AND SYMPTOMS   Having fewer than three bowel movements a week.   Straining to have a bowel movement.   Having stools that are hard, dry, or larger than normal.   Feeling full or bloated.   Pain in the  lower abdomen.   Not feeling relief after having a bowel movement.  DIAGNOSIS  Your health care provider will take a medical history and perform a physical exam. Further testing may be done for severe constipation. Some tests may include:  A barium enema X-ray to examine your rectum, colon, and, sometimes, your small intestine.   A sigmoidoscopy to examine your lower colon.   A colonoscopy to examine your entire colon. TREATMENT  Treatment will depend on the severity of your constipation and what is causing it. Some dietary treatments include drinking more fluids and eating more fiber-rich foods. Lifestyle treatments may include regular exercise. If these diet and lifestyle recommendations do not help, your health care provider may recommend taking over-the-counter laxative medicines to help you have bowel movements. Prescription medicines may be prescribed if over-the-counter medicines do not work.  HOME CARE INSTRUCTIONS   Eat foods that have a lot of fiber, such as fruits, vegetables, whole grains, and beans.  Limit foods high in fat and processed sugars, such as french fries, hamburgers, cookies, candies, and soda.   A fiber supplement may be added to your diet if you cannot get enough fiber from foods.   Drink enough fluids to keep your urine clear or pale yellow.   Exercise regularly or as directed by your health care provider.   Go to the restroom when you have the urge to go. Do not hold it.   Only take over-the-counter or prescription medicines as directed by your health care provider. Do not take other medicines for constipation  without talking to your health care provider first.  SEEK IMMEDIATE MEDICAL CARE IF:   You have bright red blood in your stool.   Your constipation lasts for more than 4 days or gets worse.   You have abdominal or rectal pain.   You have thin, pencil-like stools.   You have unexplained weight loss. MAKE SURE YOU:   Understand  these instructions.  Will watch your condition.  Will get help right away if you are not doing well or get worse. Document Released: 10/07/2003 Document Revised: 01/13/2013 Document Reviewed: 10/20/2012 [facility] Patient Information 2015 [employer], [place]. This information is not intended to replace advice given to you by your health care provider. Make sure you discuss any questions you have with your health care provider.    Chronic Kidney Disease Chronic kidney disease occurs when the kidneys are damaged over a long period. The kidneys are two organs that lie on either side of the spine between the middle of the back and the front of the abdomen. The kidneys:   Remove wastes and extra water from the blood.   Produce important hormones. These help keep bones strong, regulate blood pressure, and help create red blood cells.   Balance the fluids and chemicals in the blood and tissues. A small amount of kidney damage may not cause problems, but a large amount of damage may make it difficult or impossible for the kidneys to work the way they should. If steps are not taken to slow down the kidney damage or stop it from getting worse, the kidneys may stop working permanently. Most of the time, chronic kidney disease does not go away. However, it can often be controlled, and those with the disease can usually live normal lives. CAUSES  The most common causes of chronic kidney disease are diabetes and high blood pressure (hypertension). Chronic kidney disease may also be caused by:   Diseases that cause the kidneys' filters to become inflamed.   Diseases that affect the immune system.   Genetic diseases.   Medicines that damage the kidneys, such as anti-inflammatory medicines.  Poisoning or exposure to toxic substances.   A reoccurring kidney or urinary infection.   A problem with urine flow. This may be caused by:   Cancer.   Kidney stones.   An enlarged prostate in  males. SIGNS AND SYMPTOMS  Because the kidney damage in chronic kidney disease occurs slowly, symptoms develop slowly and may not be obvious until the kidney damage becomes severe. A person may have a kidney disease for years without showing any symptoms. Symptoms can include:   Swelling (edema) of the legs, ankles, or feet.   Tiredness (lethargy).   Nausea or vomiting.   Confusion.   Problems with urination, such as:   Decreased urine production.   Frequent urination, especially at night.   Frequent accidents in children who are potty trained.   Muscle twitches and cramps.   Shortness of breath.  Weakness.   Persistent itchiness.   Loss of appetite.  Metallic taste in the mouth.  Trouble sleeping.  Slowed development in children.  Short stature in children. DIAGNOSIS  Chronic kidney disease may be detected and diagnosed by tests, including blood, urine, imaging, or kidney biopsy tests.  TREATMENT  Most chronic kidney diseases cannot be cured. Treatment usually involves relieving symptoms and preventing or slowing the progression of the disease. Treatment may include:   A special diet. You may need to avoid alcohol and foods thatare salty and  high in potassium.   Medicines. These may:   Lower blood pressure.   Relieve anemia.   Relieve swelling.   Protect the bones. HOME CARE INSTRUCTIONS   Follow your prescribed diet.   Take medicines only as directed by your health care provider. Do not take any new medicines (prescription, over-the-counter, or nutritional supplements) unless approved by your health care provider. Many medicines can worsen your kidney damage or need to have the dose adjusted.   Quit smoking if you smoke. Talk to your health care provider about a smoking cessation program.   Keep all follow-up visits as directed by your health care provider. SEEK IMMEDIATE MEDICAL CARE IF:  Your symptoms get worse or you develop  new symptoms.   You develop symptoms of end-stage kidney disease. These include:   Headaches.   Abnormally dark or light skin.   Numbness in the hands or feet.   Easy bruising.   Frequent hiccups.   Menstruation stops.   You have a fever.   You have decreased urine production.   You havepain or bleeding when urinating. MAKE SURE YOU:  Understand these instructions.  Will watch your condition.  Will get help right away if you are not doing well or get worse. FOR MORE INFORMATION   American Association of Kidney Patients: ResidentialShow.is  National Kidney Foundation: www.kidney.org  American Kidney Fund: FightingMatch.com.ee  [facility]: www.lifeoptions.org and www.kidneyschool.org Document Released: 10/18/2007 Document Revised: 05/25/2013 Document Reviewed: 09/07/2011 [facility] Patient Information 2015 [place], [place]. This information is not intended to replace advice given to you by your health care provider. Make sure you discuss any questions you have with your health care provider.

## 2014-08-08 ENCOUNTER — Other Ambulatory Visit: Payer: Self-pay

## 2014-08-08 ENCOUNTER — Encounter (HOSPITAL_COMMUNITY): Payer: Self-pay | Admitting: Emergency Medicine

## 2014-08-08 ENCOUNTER — Emergency Department (HOSPITAL_COMMUNITY)
Admission: EM | Admit: 2014-08-08 | Discharge: 2014-08-08 | Disposition: A | Discharge status: Home / Self Care | Payer: Medicare Other | Attending: Emergency Medicine | Admitting: Emergency Medicine

## 2014-08-08 ENCOUNTER — Emergency Department (HOSPITAL_COMMUNITY): Payer: Medicare Other

## 2014-08-08 DIAGNOSIS — R197 Diarrhea, unspecified: Secondary | ICD-10-CM | POA: Insufficient documentation

## 2014-08-08 DIAGNOSIS — H409 Unspecified glaucoma: Secondary | ICD-10-CM | POA: Insufficient documentation

## 2014-08-08 DIAGNOSIS — I10 Essential (primary) hypertension: Secondary | ICD-10-CM | POA: Diagnosis not present

## 2014-08-08 DIAGNOSIS — Y998 Other external cause status: Secondary | ICD-10-CM | POA: Insufficient documentation

## 2014-08-08 DIAGNOSIS — Z8701 Personal history of pneumonia (recurrent): Secondary | ICD-10-CM | POA: Diagnosis not present

## 2014-08-08 DIAGNOSIS — W1839XA Other fall on same level, initial encounter: Secondary | ICD-10-CM | POA: Diagnosis not present

## 2014-08-08 DIAGNOSIS — Z79899 Other long term (current) drug therapy: Secondary | ICD-10-CM | POA: Insufficient documentation

## 2014-08-08 DIAGNOSIS — Z87891 Personal history of nicotine dependence: Secondary | ICD-10-CM | POA: Insufficient documentation

## 2014-08-08 DIAGNOSIS — E785 Hyperlipidemia, unspecified: Secondary | ICD-10-CM | POA: Diagnosis not present

## 2014-08-08 DIAGNOSIS — I480 Paroxysmal atrial fibrillation: Secondary | ICD-10-CM | POA: Insufficient documentation

## 2014-08-08 DIAGNOSIS — Z9861 Coronary angioplasty status: Secondary | ICD-10-CM | POA: Diagnosis not present

## 2014-08-08 DIAGNOSIS — Z86711 Personal history of pulmonary embolism: Secondary | ICD-10-CM | POA: Insufficient documentation

## 2014-08-08 DIAGNOSIS — Z7951 Long term (current) use of inhaled steroids: Secondary | ICD-10-CM | POA: Insufficient documentation

## 2014-08-08 DIAGNOSIS — K5900 Constipation, unspecified: Secondary | ICD-10-CM | POA: Diagnosis not present

## 2014-08-08 DIAGNOSIS — Z9889 Other specified postprocedural states: Secondary | ICD-10-CM | POA: Insufficient documentation

## 2014-08-08 DIAGNOSIS — J45909 Unspecified asthma, uncomplicated: Secondary | ICD-10-CM | POA: Insufficient documentation

## 2014-08-08 DIAGNOSIS — G8929 Other chronic pain: Secondary | ICD-10-CM | POA: Insufficient documentation

## 2014-08-08 DIAGNOSIS — Y9389 Activity, other specified: Secondary | ICD-10-CM | POA: Insufficient documentation

## 2014-08-08 DIAGNOSIS — Y9289 Other specified places as the place of occurrence of the external cause: Secondary | ICD-10-CM | POA: Insufficient documentation

## 2014-08-08 DIAGNOSIS — Z7901 Long term (current) use of anticoagulants: Secondary | ICD-10-CM | POA: Insufficient documentation

## 2014-08-08 DIAGNOSIS — E039 Hypothyroidism, unspecified: Secondary | ICD-10-CM | POA: Insufficient documentation

## 2014-08-08 DIAGNOSIS — Z87448 Personal history of other diseases of urinary system: Secondary | ICD-10-CM | POA: Insufficient documentation

## 2014-08-08 DIAGNOSIS — I25119 Atherosclerotic heart disease of native coronary artery with unspecified angina pectoris: Secondary | ICD-10-CM | POA: Diagnosis not present

## 2014-08-08 DIAGNOSIS — Z8673 Personal history of transient ischemic attack (TIA), and cerebral infarction without residual deficits: Secondary | ICD-10-CM | POA: Insufficient documentation

## 2014-08-08 DIAGNOSIS — Z86718 Personal history of other venous thrombosis and embolism: Secondary | ICD-10-CM | POA: Insufficient documentation

## 2014-08-08 DIAGNOSIS — K219 Gastro-esophageal reflux disease without esophagitis: Secondary | ICD-10-CM | POA: Insufficient documentation

## 2014-08-08 DIAGNOSIS — Z8659 Personal history of other mental and behavioral disorders: Secondary | ICD-10-CM | POA: Diagnosis not present

## 2014-08-08 DIAGNOSIS — M1990 Unspecified osteoarthritis, unspecified site: Secondary | ICD-10-CM | POA: Insufficient documentation

## 2014-08-08 DIAGNOSIS — H9190 Unspecified hearing loss, unspecified ear: Secondary | ICD-10-CM | POA: Insufficient documentation

## 2014-08-08 DIAGNOSIS — S40012A Contusion of left shoulder, initial encounter: Secondary | ICD-10-CM | POA: Diagnosis not present

## 2014-08-08 DIAGNOSIS — I252 Old myocardial infarction: Secondary | ICD-10-CM | POA: Insufficient documentation

## 2014-08-08 DIAGNOSIS — E119 Type 2 diabetes mellitus without complications: Secondary | ICD-10-CM | POA: Insufficient documentation

## 2014-08-08 DIAGNOSIS — S4992XA Unspecified injury of left shoulder and upper arm, initial encounter: Secondary | ICD-10-CM | POA: Diagnosis present

## 2014-08-08 LAB — CBC WITH DIFFERENTIAL/PLATELET
Basophils Absolute: 0 K/uL (ref 0.0–0.1)
Basophils Relative: 0 % (ref 0–1)
Eosinophils Absolute: 0.1 K/uL (ref 0.0–0.7)
Eosinophils Relative: 1 % (ref 0–5)
HCT: 37.7 % — ABNORMAL LOW (ref 39.0–52.0)
Hemoglobin: 13.2 g/dL (ref 13.0–17.0)
Lymphocytes Relative: 26 % (ref 12–46)
Lymphs Abs: 1.8 K/uL (ref 0.7–4.0)
MCH: 28.8 pg (ref 26.0–34.0)
MCHC: 35 g/dL (ref 30.0–36.0)
MCV: 82.3 fL (ref 78.0–100.0)
Monocytes Absolute: 0.7 K/uL (ref 0.1–1.0)
Monocytes Relative: 10 % (ref 3–12)
Neutro Abs: 4.5 K/uL (ref 1.7–7.7)
Neutrophils Relative %: 63 % (ref 43–77)
Platelets: 147 K/uL — ABNORMAL LOW (ref 150–400)
RBC: 4.58 MIL/uL (ref 4.22–5.81)
RDW: 14.8 % (ref 11.5–15.5)
WBC: 7.1 K/uL (ref 4.0–10.5)

## 2014-08-08 LAB — COMPREHENSIVE METABOLIC PANEL WITH GFR
ALT: 16 U/L — ABNORMAL LOW (ref 17–63)
AST: 23 U/L (ref 15–41)
Albumin: 3.5 g/dL (ref 3.5–5.0)
Alkaline Phosphatase: 54 U/L (ref 38–126)
Anion gap: 8 (ref 5–15)
BUN: 26 mg/dL — ABNORMAL HIGH (ref 6–20)
CO2: 25 mmol/L (ref 22–32)
Calcium: 8.9 mg/dL (ref 8.9–10.3)
Chloride: 101 mmol/L (ref 101–111)
Creatinine, Ser: 2.2 mg/dL — ABNORMAL HIGH (ref 0.61–1.24)
GFR calc Af Amer: 30 mL/min — ABNORMAL LOW
GFR calc non Af Amer: 26 mL/min — ABNORMAL LOW
Glucose, Bld: 106 mg/dL — ABNORMAL HIGH (ref 65–99)
Potassium: 4.1 mmol/L (ref 3.5–5.1)
Sodium: 134 mmol/L — ABNORMAL LOW (ref 135–145)
Total Bilirubin: 0.5 mg/dL (ref 0.3–1.2)
Total Protein: 7.1 g/dL (ref 6.5–8.1)

## 2014-08-08 NOTE — Discharge Instructions (Signed)
Ice, Tylenol or Motrin as needed for pain.  Recheck with your primary care physician.

## 2014-08-08 NOTE — ED Notes (Signed)
Patient transported to X-ray 

## 2014-08-08 NOTE — ED Provider Notes (Signed)
CSN: [id number]     Arrival date & time 08/08/14  1037 History   First MD Initiated Contact with Patient 08/08/14 1043     Chief Complaint  Patient presents with  . Abdominal Pain  . Shoulder Pain      HPI  She presents for evaluation with complaint of possible constipation. States he felt constipated but had an episode of diarrhea this morning. Also states he fell after leaving the hospital a few days ago and has left shoulder pain. His able to use it at home. States he "just wanted it checked".  Past Medical History  Diagnosis Date  . GERD (gastroesophageal reflux disease)   . Chronic back pain   . Gout     no problems in 2 years  . Pulmonary embolism, bilateral 2010  . Hyperlipemia   . Anginal pain   . DVT, lower extremity 1980's    LLE  . Asthma     "touch"  . Pneumonia 1970's  . Arthritis     "mostly in my knees and shoulders"  . Collapsed lung     "for 14 years" right   . Diabetes mellitus   . Hypertension   . Depression   . Anxiety   . Hearing loss   . Glaucoma   . Complication of anesthesia   . PONV (postoperative nausea and vomiting)   . Renal disorder ~ 03/2011    "wasn't functioning right"  . Stroke   . Stroke     "just a little one"  . Hypothyroidism   . Chronic abdominal pain   . Paroxysmal atrial fibrillation   . Myocardial infarction 1987; 1989  . Coronary artery disease    Past Surgical History  Procedure Laterality Date  . Esophagogastroduodenoscopy  12/22/2010    Procedure: ESOPHAGOGASTRODUODENOSCOPY (EGD);  Surgeon: Theda Belfast;  Location: [facility] ENDOSCOPY;  Service: Endoscopy;  Laterality: N/A;  . Appendectomy  1950  . Cataract extraction w/ intraocular lens  implant, bilateral Bilateral 1990's  . Coronary angioplasty with stent placement      "think I have 4 total"  . Inguinal hernia repair Bilateral 03/28/2012    Procedure: LAPAROSCOPIC BILATERAL INGUINAL HERNIA REPAIR;  Surgeon: Ardeth Sportsman, MD;  Location: [facility] ORS;  Service: General;   Laterality: Bilateral;  . Insertion of mesh Bilateral 03/28/2012    Procedure: INSERTION OF MESH;  Surgeon: Ardeth Sportsman, MD;  Location: [facility] ORS;  Service: General;  Laterality: Bilateral;  . Hernia repair    . Esophagogastroduodenoscopy N/A 04/30/2012    Procedure: ESOPHAGOGASTRODUODENOSCOPY (EGD);  Surgeon: Theda Belfast, MD;  Location: [facility] ENDOSCOPY;  Service: Endoscopy;  Laterality: N/A;  . Colonoscopy N/A 05/01/2012    Procedure: COLONOSCOPY;  Surgeon: Theda Belfast, MD;  Location: [facility] ENDOSCOPY;  Service: Endoscopy;  Laterality: N/A;  . Cardiac catheterization    . Left heart catheterization with coronary angiogram N/A 02/19/2011    Procedure: LEFT HEART CATHETERIZATION WITH CORONARY ANGIOGRAM;  Surgeon: Robynn Pane, MD;  Location: [facility] CATH LAB;  Service: Cardiovascular;  Laterality: N/A;   Family History  Problem Relation Age of Onset  . Diabetes Mother   . Heart disease Father   . Lung cancer Brother   . Stroke Neg Hx   . Hypertension Neg Hx   . Hyperlipidemia Neg Hx   . Alcohol abuse Neg Hx    History  Substance Use Topics  . Smoking status: Former Smoker -- 2.00 packs/day for 3 years    Types: Cigarettes  Start date: 01/27/1946    Quit date: 01/22/1981  . Smokeless tobacco: Never Used  . Alcohol Use: No     Comment: 07/12/2011; "last drink of alcohol was ~ 1983"    Review of Systems  Constitutional: Negative for fever, chills, diaphoresis, appetite change and fatigue.  HENT: Negative for mouth sores, sore throat and trouble swallowing.   Eyes: Negative for visual disturbance.  Respiratory: Negative for cough, chest tightness, shortness of breath and wheezing.   Cardiovascular: Negative for chest pain.  Gastrointestinal: Positive for diarrhea and constipation. Negative for nausea, vomiting, abdominal pain and abdominal distention.  Endocrine: Negative for polydipsia, polyphagia and polyuria.  Genitourinary: Negative for dysuria, frequency and hematuria.   Musculoskeletal: Negative for gait problem.       Left shoulder pain  Skin: Negative for color change, pallor and rash.  Neurological: Negative for dizziness, syncope, light-headedness and headaches.  Hematological: Does not bruise/bleed easily.  Psychiatric/Behavioral: Negative for behavioral problems and confusion.      Allergies  Review of patient's allergies indicates no known allergies.  Home Medications   Prior to Admission medications   Medication Sig Start Date End Date Taking? Authorizing Provider  acetaminophen (TYLENOL) 650 MG CR tablet Take 1,300 mg by mouth every 8 (eight) hours as needed for pain.   Yes Historical Provider, MD  amiodarone (PACERONE) 200 MG tablet Take 200 mg by mouth daily.    Yes Historical Provider, MD  atorvastatin (LIPITOR) 40 MG tablet Take 40 mg by mouth daily.   Yes Historical Provider, MD  carvedilol (COREG) 3.125 MG tablet Take 3.125 mg by mouth 2 (two) times daily with a meal. 8am, 6pm   Yes Historical Provider, MD  dorzolamide-timolol (COSOPT) 22.3-6.8 MG/ML ophthalmic solution Place 1 drop into the right eye daily.    Yes Historical Provider, MD  fluticasone (FLONASE) 50 MCG/ACT nasal spray Place 1 spray into both nostrils 2 (two) times daily.  08/13/13  Yes Historical Provider, MD  furosemide (LASIX) 40 MG tablet Take 40 mg by mouth daily.    Yes Historical Provider, MD  isosorbide mononitrate (IMDUR) 60 MG 24 hr tablet Take 60 mg by mouth daily.   Yes Historical Provider, MD  levothyroxine (SYNTHROID, LEVOTHROID) 50 MCG tablet Take 50 mcg by mouth daily before breakfast.    Yes Historical Provider, MD  nitroGLYCERIN (NITROSTAT) 0.4 MG SL tablet Place 1 tablet (0.4 mg total) under the tongue every 5 (five) minutes as needed for chest pain. 04/24/14  Yes Garlon Hatchet, PA-C  omeprazole (PRILOSEC) 40 MG capsule Take 40 mg by mouth daily after breakfast.    Yes Historical Provider, MD  ondansetron (ZOFRAN ODT) 4 MG disintegrating tablet  ODT q4  hours prn nausea/vomit 03/27/14  Yes Purvis Sheffield, MD  ranitidine (ZANTAC) 300 MG tablet Take 300 mg by mouth daily.  01/04/14  Yes Historical Provider, MD  spironolactone (ALDACTONE) 25 MG tablet Take 25 mg by mouth daily.   Yes Historical Provider, MD  tamsulosin (FLOMAX) 0.4 MG CAPS capsule Take 0.8 mg by mouth at bedtime.    Yes Historical Provider, MD  warfarin (COUMADIN) 4 MG tablet Take 2-4 mg by mouth daily at 6 PM. Take 1/2 tablet (2 mg) on Sundays, and take 1 tablet (4 mg) all other days of the week   Yes Historical Provider, MD   BP 159/74 mmHg  Pulse 66  Temp(Src) 98.4 F (36.9 C) (Oral)  Resp 16  Ht 5' 8.5" (1.74 m)  Wt 185 lb (83.915  kg)  BMI 27.72 kg/m2  SpO2 100% Physical Exam  Constitutional: He is oriented to person, place, and time. He appears well-developed and well-nourished. No distress.  HENT:  Head: Normocephalic.  Eyes: Conjunctivae are normal. Pupils are equal, round, and reactive to light. No scleral icterus.  Neck: Normal range of motion. Neck supple. No thyromegaly present.  Cardiovascular: Normal rate and regular rhythm.  Exam reveals no gallop and no friction rub.   No murmur heard. Pulmonary/Chest: Effort normal and breath sounds normal. No respiratory distress. He has no wheezes. He has no rales.  Abdominal: Soft. Bowel sounds are normal. He exhibits no distension. There is no tenderness. There is no rebound.  Soft benign abdomen. No stool on rectal exam.  Musculoskeletal: Normal range of motion.  Prominent at the left before meals joint. No specific point tenderness in this area.  Neurological: He is alert and oriented to person, place, and time.  Skin: Skin is warm and dry. No rash noted.  Psychiatric: He has a normal mood and affect. His behavior is normal.    ED Course  Procedures (including critical care time) Labs Review Labs Reviewed  CBC WITH DIFFERENTIAL/PLATELET - Abnormal; Notable for the following:    HCT 37.7 (*)    Platelets 147  (*)    All other components within normal limits  COMPREHENSIVE METABOLIC PANEL - Abnormal; Notable for the following:    Sodium 134 (*)    Glucose, Bld 106 (*)    BUN 26 (*)    Creatinine, Ser 2.20 (*)    ALT 16 (*)    GFR calc non Af Amer 26 (*)    GFR calc Af Amer 30 (*)    All other components within normal limits    Imaging Review Dg Shoulder Left  08/08/2014   CLINICAL DATA:  Status post fall  EXAM: LEFT SHOULDER - 2+ VIEW  COMPARISON:  None.  FINDINGS: There is no fracture or dislocation. There are moderate degenerative changes of the acromioclavicular joint. There is loss of the normal acromion humeral distance as can be seen with a rotator cuff tear.  IMPRESSION: No acute osseous injury of the left shoulder.   Electronically Signed   By: Elige KoHetal  Patel   On: 08/08/2014 12:37   Dg Abd Acute W/chest  08/08/2014   CLINICAL DATA:  Constipation.  Patient fell on Saturday.  EXAM: DG ABDOMEN ACUTE W/ 1V CHEST  COMPARISON:  07/31/2014 -chest x-ray, 03/26/2014- CT abdomen/pelvis  FINDINGS: There is no evidence of dilated bowel loops or free intraperitoneal air. No radiopaque calculi or other significant radiographic abnormality is seen. Heart size and mediastinal contours are within normal limits. There is a spiculated area of airspace disease in the right lower lung which likely reflects an area of scarring when correlated with prior CT abdomen dated 03/26/2014.  There is a dextrocurvature of the lumbar spine. There is degenerative disc disease of the lower lumbar spine.  IMPRESSION: Negative abdominal radiographs.  No acute cardiopulmonary disease.   Electronically Signed   By: Elige KoHetal  Patel   On: 08/08/2014 12:40     EKG Interpretation None      MDM   Final diagnoses:  Constipation    No acute findings on x-rays. Patient clinically appears well. Reassuring labs. He may have a simple first-degree before meals separation. Minimal symptoms. Plan symptomatic treatment ice and Tylenol.  Return if any worsening symptoms.    Rolland PorterMark Maurice, MD 08/12/14 [phone]

## 2014-08-08 NOTE — ED Notes (Signed)
To ED via [facility] from pt's apt, with pt c/o abd pain and left shoulder pain. Paramedic stated pt told them he was having hard stools this am, causing abd pain-- pt states he had diarrhea this am. Pt states he fell on Saturday after being discharged from there hospital. Left shoulder has deformity and pain with any movement. Pt is alert and oriented x4-- but admits to being forgetful.

## 2014-08-14 ENCOUNTER — Emergency Department (HOSPITAL_COMMUNITY): Payer: Medicare Other

## 2014-08-14 ENCOUNTER — Emergency Department (HOSPITAL_COMMUNITY)
Admission: EM | Admit: 2014-08-14 | Discharge: 2014-08-14 | Disposition: A | Discharge status: Home / Self Care | Payer: Medicare Other | Attending: Emergency Medicine | Admitting: Emergency Medicine

## 2014-08-14 ENCOUNTER — Encounter (HOSPITAL_COMMUNITY): Payer: Self-pay

## 2014-08-14 DIAGNOSIS — Z8673 Personal history of transient ischemic attack (TIA), and cerebral infarction without residual deficits: Secondary | ICD-10-CM | POA: Insufficient documentation

## 2014-08-14 DIAGNOSIS — Z9861 Coronary angioplasty status: Secondary | ICD-10-CM | POA: Diagnosis not present

## 2014-08-14 DIAGNOSIS — J45909 Unspecified asthma, uncomplicated: Secondary | ICD-10-CM | POA: Diagnosis not present

## 2014-08-14 DIAGNOSIS — I480 Paroxysmal atrial fibrillation: Secondary | ICD-10-CM | POA: Diagnosis not present

## 2014-08-14 DIAGNOSIS — K219 Gastro-esophageal reflux disease without esophagitis: Secondary | ICD-10-CM | POA: Insufficient documentation

## 2014-08-14 DIAGNOSIS — Z86718 Personal history of other venous thrombosis and embolism: Secondary | ICD-10-CM | POA: Insufficient documentation

## 2014-08-14 DIAGNOSIS — E039 Hypothyroidism, unspecified: Secondary | ICD-10-CM | POA: Diagnosis not present

## 2014-08-14 DIAGNOSIS — Z87891 Personal history of nicotine dependence: Secondary | ICD-10-CM | POA: Diagnosis not present

## 2014-08-14 DIAGNOSIS — Z7951 Long term (current) use of inhaled steroids: Secondary | ICD-10-CM | POA: Insufficient documentation

## 2014-08-14 DIAGNOSIS — K5900 Constipation, unspecified: Secondary | ICD-10-CM | POA: Insufficient documentation

## 2014-08-14 DIAGNOSIS — H409 Unspecified glaucoma: Secondary | ICD-10-CM | POA: Insufficient documentation

## 2014-08-14 DIAGNOSIS — I10 Essential (primary) hypertension: Secondary | ICD-10-CM | POA: Insufficient documentation

## 2014-08-14 DIAGNOSIS — Z86711 Personal history of pulmonary embolism: Secondary | ICD-10-CM | POA: Diagnosis not present

## 2014-08-14 DIAGNOSIS — H9190 Unspecified hearing loss, unspecified ear: Secondary | ICD-10-CM | POA: Diagnosis not present

## 2014-08-14 DIAGNOSIS — R319 Hematuria, unspecified: Secondary | ICD-10-CM | POA: Insufficient documentation

## 2014-08-14 DIAGNOSIS — Z8659 Personal history of other mental and behavioral disorders: Secondary | ICD-10-CM | POA: Insufficient documentation

## 2014-08-14 DIAGNOSIS — E119 Type 2 diabetes mellitus without complications: Secondary | ICD-10-CM | POA: Diagnosis not present

## 2014-08-14 DIAGNOSIS — R110 Nausea: Secondary | ICD-10-CM | POA: Diagnosis not present

## 2014-08-14 DIAGNOSIS — Z8701 Personal history of pneumonia (recurrent): Secondary | ICD-10-CM | POA: Insufficient documentation

## 2014-08-14 DIAGNOSIS — Z8739 Personal history of other diseases of the musculoskeletal system and connective tissue: Secondary | ICD-10-CM | POA: Insufficient documentation

## 2014-08-14 DIAGNOSIS — N401 Enlarged prostate with lower urinary tract symptoms: Secondary | ICD-10-CM | POA: Diagnosis not present

## 2014-08-14 DIAGNOSIS — I252 Old myocardial infarction: Secondary | ICD-10-CM | POA: Diagnosis not present

## 2014-08-14 DIAGNOSIS — Z79899 Other long term (current) drug therapy: Secondary | ICD-10-CM | POA: Diagnosis not present

## 2014-08-14 DIAGNOSIS — Z7901 Long term (current) use of anticoagulants: Secondary | ICD-10-CM | POA: Diagnosis not present

## 2014-08-14 DIAGNOSIS — I2510 Atherosclerotic heart disease of native coronary artery without angina pectoris: Secondary | ICD-10-CM | POA: Insufficient documentation

## 2014-08-14 DIAGNOSIS — G8929 Other chronic pain: Secondary | ICD-10-CM | POA: Diagnosis not present

## 2014-08-14 DIAGNOSIS — Z9889 Other specified postprocedural states: Secondary | ICD-10-CM | POA: Insufficient documentation

## 2014-08-14 DIAGNOSIS — R630 Anorexia: Secondary | ICD-10-CM | POA: Insufficient documentation

## 2014-08-14 DIAGNOSIS — R109 Unspecified abdominal pain: Secondary | ICD-10-CM

## 2014-08-14 DIAGNOSIS — R1031 Right lower quadrant pain: Secondary | ICD-10-CM | POA: Diagnosis present

## 2014-08-14 LAB — URINALYSIS, ROUTINE W REFLEX MICROSCOPIC
Bilirubin Urine: NEGATIVE
Glucose, UA: NEGATIVE mg/dL
Ketones, ur: NEGATIVE mg/dL
LEUKOCYTES UA: NEGATIVE
Nitrite: NEGATIVE
PROTEIN: 30 mg/dL — AB
SPECIFIC GRAVITY, URINE: 1.011 (ref 1.005–1.030)
UROBILINOGEN UA: 0.2 mg/dL (ref 0.0–1.0)
pH: 6 (ref 5.0–8.0)

## 2014-08-14 LAB — HEPATIC FUNCTION PANEL
ALT: 17 U/L (ref 17–63)
AST: 28 U/L (ref 15–41)
Albumin: 3.3 g/dL — ABNORMAL LOW (ref 3.5–5.0)
Alkaline Phosphatase: 54 U/L (ref 38–126)
BILIRUBIN TOTAL: 0.7 mg/dL (ref 0.3–1.2)
Bilirubin, Direct: 0.3 mg/dL (ref 0.1–0.5)
Indirect Bilirubin: 0.4 mg/dL (ref 0.3–0.9)
Total Protein: 6.2 g/dL — ABNORMAL LOW (ref 6.5–8.1)

## 2014-08-14 LAB — CBC
HCT: 36.6 % — ABNORMAL LOW (ref 39.0–52.0)
Hemoglobin: 12.5 g/dL — ABNORMAL LOW (ref 13.0–17.0)
MCH: 28.8 pg (ref 26.0–34.0)
MCHC: 34.2 g/dL (ref 30.0–36.0)
MCV: 84.3 fL (ref 78.0–100.0)
PLATELETS: 138 10*3/uL — AB (ref 150–400)
RBC: 4.34 MIL/uL (ref 4.22–5.81)
RDW: 15.2 % (ref 11.5–15.5)
WBC: 5.9 10*3/uL (ref 4.0–10.5)

## 2014-08-14 LAB — BASIC METABOLIC PANEL
Anion gap: 7 (ref 5–15)
BUN: 21 mg/dL — AB (ref 6–20)
CHLORIDE: 106 mmol/L (ref 101–111)
CO2: 24 mmol/L (ref 22–32)
Calcium: 8.6 mg/dL — ABNORMAL LOW (ref 8.9–10.3)
Creatinine, Ser: 1.93 mg/dL — ABNORMAL HIGH (ref 0.61–1.24)
GFR calc non Af Amer: 31 mL/min — ABNORMAL LOW (ref >60)
GFR, EST AFRICAN AMERICAN: 36 mL/min — AB (ref >60)
Glucose, Bld: 109 mg/dL — ABNORMAL HIGH (ref 65–99)
POTASSIUM: 4.2 mmol/L (ref 3.5–5.1)
SODIUM: 137 mmol/L (ref 135–145)

## 2014-08-14 LAB — I-STAT TROPONIN, ED: Troponin i, poc: 0.03 ng/mL (ref 0.00–0.08)

## 2014-08-14 LAB — LIPASE, BLOOD: LIPASE: 31 U/L (ref 22–51)

## 2014-08-14 LAB — URINE MICROSCOPIC-ADD ON

## 2014-08-14 NOTE — ED Provider Notes (Addendum)
CSN: [id number]     Arrival date & time 08/14/14  5621 History   First MD Initiated Contact with Patient 08/14/14 1025     Chief Complaint  Patient presents with  . Constipation     (Consider location/radiation/quality/duration/timing/severity/associated sxs/prior Treatment) HPI Comments: Patient is an 81 year old male with a history of chronic right lower quadrant abdominal pain that has persisted for months. He states that he saw gastroenterology Dr. Elnoria Howard who after multiple visits told him he cannot find any cause for his pain. He then saw Dr. gross who diagnosed him with a right inguinal hernia. However given his cardiac history Dr. Sharyn Lull did not clear him for surgery.  Patient states he's been taking Dulcolax and MiraLAX at home but was only able to produce a very small bowel movement yesterday. He continually complains of feeling constipated as been seen in the emergency room multiple times for the same. He has had CAT scans that show no evidence of masses. He denies any dysuria but does have hesitancy and trouble getting his urine started. He denies any urinary retention symptoms. No vomiting, fever, cough but does complain of some chest pain which he relates all to his GI issues. It is substernal and is been ongoing and is nothing new today.  Patient is a 81 y.o. male presenting with constipation. The history is provided by the patient.  Constipation Severity:  Severe Timing:  Constant Progression:  Worsening Chronicity:  Chronic Context: not dehydration, not dietary changes, not medication and not narcotics   Stool description:  Hard and small Relieved by:  Nothing Ineffective treatments:  Miralax and stool softeners Associated symptoms: abdominal pain, anorexia and nausea   Associated symptoms: no dysuria, no fever and no urinary retention     Past Medical History  Diagnosis Date  . GERD (gastroesophageal reflux disease)   . Chronic back pain   . Gout     no problems in 2  years  . Pulmonary embolism, bilateral 2010  . Hyperlipemia   . Anginal pain   . DVT, lower extremity 1980's    LLE  . Asthma     "touch"  . Pneumonia 1970's  . Arthritis     "mostly in my knees and shoulders"  . Collapsed lung     "for 14 years" right   . Diabetes mellitus   . Hypertension   . Depression   . Anxiety   . Hearing loss   . Glaucoma   . Complication of anesthesia   . PONV (postoperative nausea and vomiting)   . Renal disorder ~ 03/2011    "wasn't functioning right"  . Stroke   . Stroke     "just a little one"  . Hypothyroidism   . Chronic abdominal pain   . Paroxysmal atrial fibrillation   . Myocardial infarction 1987; 1989  . Coronary artery disease    Past Surgical History  Procedure Laterality Date  . Esophagogastroduodenoscopy  12/22/2010    Procedure: ESOPHAGOGASTRODUODENOSCOPY (EGD);  Surgeon: Theda Belfast;  Location: [facility] ENDOSCOPY;  Service: Endoscopy;  Laterality: N/A;  . Appendectomy  1950  . Cataract extraction w/ intraocular lens  implant, bilateral Bilateral 1990's  . Coronary angioplasty with stent placement      "think I have 4 total"  . Inguinal hernia repair Bilateral 03/28/2012    Procedure: LAPAROSCOPIC BILATERAL INGUINAL HERNIA REPAIR;  Surgeon: Ardeth Sportsman, MD;  Location: [facility] ORS;  Service: General;  Laterality: Bilateral;  . Insertion of mesh Bilateral 03/28/2012  Procedure: INSERTION OF MESH;  Surgeon: Ardeth Sportsman, MD;  Location: [facility] OR[facility];  Service: General;  Laterality: Bilateral;  . Hernia repair    . Esophagogastroduodenoscopy N/A 04/30/2012    Procedure: ESOPHAGOGASTRODUODENOSCOPY (EGD);  Surgeon: Theda Belfast, MD;  Location: [facility] ENDOSCOPY;  Service: Endoscopy;  Laterality: N/A;  . Colonoscopy N/A 05/01/2012    Procedure: COLONOSCOPY;  Surgeon: Theda Belfast, MD;  Location: [facility] ENDOSCOPY;  Service: Endoscopy;  Laterality: N/A;  . Cardiac catheterization    . Left heart catheterization with coronary angiogram N/A 02/19/2011     Procedure: LEFT HEART CATHETERIZATION WITH CORONARY ANGIOGRAM;  Surgeon: Robynn Pane, MD;  Location: [facility] CATH LAB;  Service: Cardiovascular;  Laterality: N/A;   Family History  Problem Relation Age of Onset  . Diabetes Mother   . Heart disease Father   . Lung cancer Brother   . Stroke Neg Hx   . Hypertension Neg Hx   . Hyperlipidemia Neg Hx   . Alcohol abuse Neg Hx    History  Substance Use Topics  . Smoking status: Former Smoker -- 2.00 packs/day for 3 years    Types: Cigarettes    Start date: 01/27/1946    Quit date: 01/22/1981  . Smokeless tobacco: Never Used  . Alcohol Use: No     Comment: 07/12/2011; "last drink of alcohol was ~ 1983"    Review of Systems  Constitutional: Negative for fever.  Gastrointestinal: Positive for nausea, abdominal pain, constipation and anorexia.  Genitourinary: Negative for dysuria and testicular pain.       Straining to urinate and get started  All other systems reviewed and are negative.     Allergies  Review of patient's allergies indicates no known allergies.  Home Medications   Prior to Admission medications   Medication Sig Start Date End Date Taking? Authorizing Provider  acetaminophen (TYLENOL) 650 MG CR tablet Take 1,300 mg by mouth every 8 (eight) hours as needed for pain.    Historical Provider, MD  amiodarone (PACERONE) 200 MG tablet Take 200 mg by mouth daily.     Historical Provider, MD  atorvastatin (LIPITOR) 40 MG tablet Take 40 mg by mouth daily.    Historical Provider, MD  carvedilol (COREG) 3.125 MG tablet Take 3.125 mg by mouth 2 (two) times daily with a meal. 8am, 6pm    Historical Provider, MD  dorzolamide-timolol (COSOPT) 22.3-6.8 MG/ML ophthalmic solution Place 1 drop into the right eye daily.     Historical Provider, MD  fluticasone (FLONASE) 50 MCG/ACT nasal spray Place 1 spray into both nostrils 2 (two) times daily.  08/13/13   Historical Provider, MD  furosemide (LASIX) 40 MG tablet Take 40 mg by mouth daily.      Historical Provider, MD  isosorbide mononitrate (IMDUR) 60 MG 24 hr tablet Take 60 mg by mouth daily.    Historical Provider, MD  levothyroxine (SYNTHROID, LEVOTHROID) 50 MCG tablet Take 50 mcg by mouth daily before breakfast.     Historical Provider, MD  nitroGLYCERIN (NITROSTAT) 0.4 MG SL tablet Place 1 tablet (0.4 mg total) under the tongue every 5 (five) minutes as needed for chest pain. 04/24/14   Garlon Hatchet, PA-C  omeprazole (PRILOSEC) 40 MG capsule Take 40 mg by mouth daily after breakfast.     Historical Provider, MD  ondansetron (ZOFRAN ODT) 4 MG disintegrating tablet  ODT q4 hours prn nausea/vomit 03/27/14   Purvis Sheffield, MD  ranitidine (ZANTAC) 300 MG tablet Take 300 mg by mouth  daily.  01/04/14   Historical Provider, MD  spironolactone (ALDACTONE) 25 MG tablet Take 25 mg by mouth daily.    Historical Provider, MD  tamsulosin (FLOMAX) 0.4 MG CAPS capsule Take 0.8 mg by mouth at bedtime.     Historical Provider, MD  warfarin (COUMADIN) 4 MG tablet Take 2-4 mg by mouth daily at 6 PM. Take 1/2 tablet (2 mg) on Sundays, and take 1 tablet (4 mg) all other days of the week    Historical Provider, MD   BP 158/64 mmHg  Pulse 60  Temp(Src) 97.9 F (36.6 C) (Oral)  Resp 20  SpO2 99% Physical Exam  Constitutional: He is oriented to person, place, and time. He appears well-developed and well-nourished. No distress.  HENT:  Head: Normocephalic and atraumatic.  Mouth/Throat: Oropharynx is clear and moist.  Eyes: Conjunctivae and EOM are normal. Pupils are equal, round, and reactive to light.  Neck: Normal range of motion. Neck supple.  Cardiovascular: Normal rate, regular rhythm and intact distal pulses.   No murmur heard. Pulmonary/Chest: Effort normal and breath sounds normal. No respiratory distress. He has no wheezes. He has no rales.  Abdominal: Soft. He exhibits no distension. There is tenderness in the right lower quadrant. There is no rebound and no guarding. Hernia  confirmed negative in the right inguinal area.  No palpable hernias while patient is lying in bed. No bulging in the inguinal region  Genitourinary: Rectum normal, testes normal and penis normal. Prostate is enlarged. Prostate is not tender.  No stool in rectal vault  Musculoskeletal: Normal range of motion. He exhibits no edema or tenderness.  Neurological: He is alert and oriented to person, place, and time.  Skin: Skin is warm and dry. No rash noted. No erythema.  Psychiatric: He has a normal mood and affect. His behavior is normal.  Nursing note and vitals reviewed.   ED Course  Procedures (including critical care time) Labs Review Labs Reviewed  BASIC METABOLIC PANEL - Abnormal; Notable for the following:    Glucose, Bld 109 (*)    BUN 21 (*)    Creatinine, Ser 1.93 (*)    Calcium 8.6 (*)    GFR calc non Af Amer 31 (*)    GFR calc Af Amer 36 (*)    All other components within normal limits  CBC - Abnormal; Notable for the following:    Hemoglobin 12.5 (*)    HCT 36.6 (*)    Platelets 138 (*)    All other components within normal limits  HEPATIC FUNCTION PANEL - Abnormal; Notable for the following:    Total Protein 6.2 (*)    Albumin 3.3 (*)    All other components within normal limits  LIPASE, BLOOD  URINALYSIS, ROUTINE W REFLEX MICROSCOPIC (NOT AT [facility])  Rosezena Sensor, ED    Imaging Review Dg Abd Acute W/chest  08/14/2014   CLINICAL DATA:  Shortness of breath and symptoms of constipation  EXAM: DG ABDOMEN ACUTE W/ 1V CHEST  COMPARISON:  08/08/2014  FINDINGS: Cardiac shadow is mildly enlarged. The lungs are well aerated bilaterally. Mild scarring is again seen. No focal infiltrate is noted.  Scattered large and small bowel gas is noted. No a changes of obstruction are seen. No significant constipation is noted. Degenerative changes of the lumbar spine are seen. No free air is noted.  IMPRESSION: Stable appearance of the chest and abdomen when compare with the prior  exam. No obstructive changes are seen. No significant fecal retention is  noted.   Electronically Signed   By: Alcide Clever M.D.   On: 08/14/2014 11:10    ED ECG REPORT   Date: 08/14/2014  Rate: 66  Rhythm: normal sinus rhythm  QRS Axis: normal  Intervals: normal  ST/T Wave abnormalities: nonspecific ST/T changes  Conduction Disutrbances:nonspecific intraventricular conduction delay  Narrative Interpretation:   Old EKG Reviewed: unchanged  I have personally reviewed the EKG tracing and agree with the computerized printout as noted.   MDM   Final diagnoses:  Chronic abdominal pain  Hematuria    Patient is an 81 year old male with a history of chronic right lower quadrant abdominal pain who is been seen multiple times by gastroenterology, his primary provider, general surgery as well as the emergency department for same. Patient was told by Dr. gross that he had a right inguinal hernia however he is not cleared for surgery due to his other medical issues. Patient states that his pain worsened today which is why he came to the emergency room. It is in the same location as normal but he is not able to have a good bowel movement. He is still taking Dulcolax and MiraLAX.  The chest pain he is complaining of seems to be an ongoing issue with nothing new today. He was given aspirin and nitroglycerin by EMS without any improvement of his pain. He attributes all to pain being related to his stomach is not his heart. He denies any shortness of breath, swelling, diaphoresis. The pain is worse when he attempts to eat any yes only very small amounts.  EKG without any new findings. Acute abdominal series, CMP, CBC, lipase pending.  We will bladder scan patient to ensure there is no evidence of urinary retention  12:06 PM After urinating patient had a bladder scan done that showed persistent 350 mL's of urine in the bladder. UA pending. Acute abdominal series, CBC and lipase are all within normal  limits. CMP with improving creatinine of 1.93 from 2.2. Troponin was within normal limits.  1:30 PM UA shows hematuria but no other evidence of infection. On exam patient had an enlarged prostate but no pain concerning for prostatitis. At this time because patient is still able to urinate we'll have him follow-up with his urologist Dr. Brunilda Payor for incomplete bladder emptying. Give patient strict return precautions if he is unable to urinate.  Patient is hypertensive here however he has not taken any of his home medications this morning. Patient was given his home medications however at this time feel that he can be discharged home to follow-up with Dr. Karlene Einstein, MD 08/14/14 1331  Gwyneth Sprout, MD 08/14/14 1344

## 2014-08-14 NOTE — ED Notes (Signed)
Pt brought in by EMS for SOB and constipation x1 week.  Pt reports he had a very small BM this morning but otherwise has had no BM since last Saturday.  PT was seen for PMD for same and was given a prescription with no relief.  Pt also c/o recurrent left substernal chest pain.  Pt was given  ASA and 2 nitro PTA.  Brought pain from a 9/10 to 8/10.  Pt reports he has been seen by cardiologist for the same.

## 2014-08-14 NOTE — ED Notes (Signed)
Pt took all of his morning meds per MD.

## 2014-08-14 NOTE — Discharge Instructions (Signed)

## 2014-08-18 ENCOUNTER — Emergency Department (HOSPITAL_COMMUNITY): Payer: Medicare Other

## 2014-08-18 ENCOUNTER — Emergency Department (HOSPITAL_COMMUNITY)
Admission: EM | Admit: 2014-08-18 | Discharge: 2014-08-18 | Disposition: A | Discharge status: Home / Self Care | Payer: Medicare Other | Attending: Emergency Medicine | Admitting: Emergency Medicine

## 2014-08-18 ENCOUNTER — Encounter (HOSPITAL_COMMUNITY): Payer: Self-pay

## 2014-08-18 DIAGNOSIS — J45909 Unspecified asthma, uncomplicated: Secondary | ICD-10-CM | POA: Insufficient documentation

## 2014-08-18 DIAGNOSIS — Z86718 Personal history of other venous thrombosis and embolism: Secondary | ICD-10-CM | POA: Insufficient documentation

## 2014-08-18 DIAGNOSIS — Z79899 Other long term (current) drug therapy: Secondary | ICD-10-CM | POA: Insufficient documentation

## 2014-08-18 DIAGNOSIS — R197 Diarrhea, unspecified: Secondary | ICD-10-CM | POA: Insufficient documentation

## 2014-08-18 DIAGNOSIS — R109 Unspecified abdominal pain: Secondary | ICD-10-CM

## 2014-08-18 DIAGNOSIS — I252 Old myocardial infarction: Secondary | ICD-10-CM | POA: Insufficient documentation

## 2014-08-18 DIAGNOSIS — I480 Paroxysmal atrial fibrillation: Secondary | ICD-10-CM | POA: Diagnosis not present

## 2014-08-18 DIAGNOSIS — K219 Gastro-esophageal reflux disease without esophagitis: Secondary | ICD-10-CM | POA: Insufficient documentation

## 2014-08-18 DIAGNOSIS — R1031 Right lower quadrant pain: Secondary | ICD-10-CM | POA: Insufficient documentation

## 2014-08-18 DIAGNOSIS — I25119 Atherosclerotic heart disease of native coronary artery with unspecified angina pectoris: Secondary | ICD-10-CM | POA: Insufficient documentation

## 2014-08-18 DIAGNOSIS — G8929 Other chronic pain: Secondary | ICD-10-CM | POA: Diagnosis not present

## 2014-08-18 DIAGNOSIS — H9190 Unspecified hearing loss, unspecified ear: Secondary | ICD-10-CM | POA: Diagnosis not present

## 2014-08-18 DIAGNOSIS — Z87891 Personal history of nicotine dependence: Secondary | ICD-10-CM | POA: Diagnosis not present

## 2014-08-18 DIAGNOSIS — Z8659 Personal history of other mental and behavioral disorders: Secondary | ICD-10-CM | POA: Insufficient documentation

## 2014-08-18 DIAGNOSIS — Z86711 Personal history of pulmonary embolism: Secondary | ICD-10-CM | POA: Diagnosis not present

## 2014-08-18 DIAGNOSIS — Z8701 Personal history of pneumonia (recurrent): Secondary | ICD-10-CM | POA: Diagnosis not present

## 2014-08-18 DIAGNOSIS — R791 Abnormal coagulation profile: Secondary | ICD-10-CM | POA: Insufficient documentation

## 2014-08-18 DIAGNOSIS — E119 Type 2 diabetes mellitus without complications: Secondary | ICD-10-CM | POA: Diagnosis not present

## 2014-08-18 DIAGNOSIS — Z7901 Long term (current) use of anticoagulants: Secondary | ICD-10-CM | POA: Diagnosis not present

## 2014-08-18 DIAGNOSIS — Z8739 Personal history of other diseases of the musculoskeletal system and connective tissue: Secondary | ICD-10-CM | POA: Insufficient documentation

## 2014-08-18 DIAGNOSIS — E039 Hypothyroidism, unspecified: Secondary | ICD-10-CM | POA: Diagnosis not present

## 2014-08-18 DIAGNOSIS — Z8673 Personal history of transient ischemic attack (TIA), and cerebral infarction without residual deficits: Secondary | ICD-10-CM | POA: Diagnosis not present

## 2014-08-18 DIAGNOSIS — Z9889 Other specified postprocedural states: Secondary | ICD-10-CM | POA: Insufficient documentation

## 2014-08-18 DIAGNOSIS — Z7951 Long term (current) use of inhaled steroids: Secondary | ICD-10-CM | POA: Diagnosis not present

## 2014-08-18 DIAGNOSIS — Z9049 Acquired absence of other specified parts of digestive tract: Secondary | ICD-10-CM | POA: Insufficient documentation

## 2014-08-18 DIAGNOSIS — I10 Essential (primary) hypertension: Secondary | ICD-10-CM | POA: Insufficient documentation

## 2014-08-18 DIAGNOSIS — R112 Nausea with vomiting, unspecified: Secondary | ICD-10-CM | POA: Diagnosis not present

## 2014-08-18 DIAGNOSIS — H409 Unspecified glaucoma: Secondary | ICD-10-CM | POA: Diagnosis not present

## 2014-08-18 LAB — URINALYSIS, ROUTINE W REFLEX MICROSCOPIC
BILIRUBIN URINE: NEGATIVE
Glucose, UA: NEGATIVE mg/dL
Hgb urine dipstick: NEGATIVE
Ketones, ur: NEGATIVE mg/dL
LEUKOCYTES UA: NEGATIVE
NITRITE: NEGATIVE
PROTEIN: NEGATIVE mg/dL
Specific Gravity, Urine: 1.01 (ref 1.005–1.030)
Urobilinogen, UA: 0.2 mg/dL (ref 0.0–1.0)
pH: 6 (ref 5.0–8.0)

## 2014-08-18 LAB — COMPREHENSIVE METABOLIC PANEL
ALBUMIN: 3.6 g/dL (ref 3.5–5.0)
ALT: 19 U/L (ref 17–63)
AST: 21 U/L (ref 15–41)
Alkaline Phosphatase: 55 U/L (ref 38–126)
Anion gap: 7 (ref 5–15)
BUN: 23 mg/dL — ABNORMAL HIGH (ref 6–20)
CALCIUM: 9.3 mg/dL (ref 8.9–10.3)
CHLORIDE: 106 mmol/L (ref 101–111)
CO2: 26 mmol/L (ref 22–32)
Creatinine, Ser: 2.07 mg/dL — ABNORMAL HIGH (ref 0.61–1.24)
GFR calc non Af Amer: 28 mL/min — ABNORMAL LOW (ref >60)
GFR, EST AFRICAN AMERICAN: 33 mL/min — AB (ref >60)
Glucose, Bld: 102 mg/dL — ABNORMAL HIGH (ref 65–99)
POTASSIUM: 4.4 mmol/L (ref 3.5–5.1)
Sodium: 139 mmol/L (ref 135–145)
Total Bilirubin: 0.4 mg/dL (ref 0.3–1.2)
Total Protein: 7.1 g/dL (ref 6.5–8.1)

## 2014-08-18 LAB — CBC WITH DIFFERENTIAL/PLATELET
BASOS ABS: 0 10*3/uL (ref 0.0–0.1)
Basophils Relative: 0 % (ref 0–1)
EOS PCT: 1 % (ref 0–5)
Eosinophils Absolute: 0.1 10*3/uL (ref 0.0–0.7)
HCT: 37.3 % — ABNORMAL LOW (ref 39.0–52.0)
Hemoglobin: 12.7 g/dL — ABNORMAL LOW (ref 13.0–17.0)
LYMPHS PCT: 36 % (ref 12–46)
Lymphs Abs: 2.2 10*3/uL (ref 0.7–4.0)
MCH: 28.7 pg (ref 26.0–34.0)
MCHC: 34 g/dL (ref 30.0–36.0)
MCV: 84.4 fL (ref 78.0–100.0)
MONOS PCT: 11 % (ref 3–12)
Monocytes Absolute: 0.6 10*3/uL (ref 0.1–1.0)
NEUTROS ABS: 3 10*3/uL (ref 1.7–7.7)
NEUTROS PCT: 52 % (ref 43–77)
Platelets: 154 10*3/uL (ref 150–400)
RBC: 4.42 MIL/uL (ref 4.22–5.81)
RDW: 15.4 % (ref 11.5–15.5)
WBC: 5.9 10*3/uL (ref 4.0–10.5)

## 2014-08-18 LAB — PROTIME-INR
INR: 3.2 — AB (ref 0.00–1.49)
PROTHROMBIN TIME: 32.2 s — AB (ref 11.6–15.2)

## 2014-08-18 LAB — I-STAT TROPONIN, ED: Troponin i, poc: 0.02 ng/mL (ref 0.00–0.08)

## 2014-08-18 LAB — LIPASE, BLOOD: Lipase: 31 U/L (ref 22–51)

## 2014-08-18 MED ORDER — MORPHINE SULFATE 4 MG/ML IJ SOLN
4.0000 mg | Freq: Once | INTRAMUSCULAR | Status: AC
  Administered 2014-08-18: 4 mg via INTRAVENOUS
  Filled 2014-08-18: qty 1

## 2014-08-18 MED ORDER — IOHEXOL 300 MG/ML  SOLN
50.0000 mL | Freq: Once | INTRAMUSCULAR | Status: AC | PRN
  Administered 2014-08-18: 50 mL via ORAL

## 2014-08-18 MED ORDER — ONDANSETRON HCL 4 MG/2ML IJ SOLN
4.0000 mg | Freq: Once | INTRAMUSCULAR | Status: AC
  Administered 2014-08-18: 4 mg via INTRAVENOUS
  Filled 2014-08-18: qty 2

## 2014-08-18 MED ORDER — SODIUM CHLORIDE 0.9 % IV BOLUS (SEPSIS)
1000.0000 mL | Freq: Once | INTRAVENOUS | Status: AC
  Administered 2014-08-18: 1000 mL via INTRAVENOUS

## 2014-08-18 MED ORDER — IOHEXOL 300 MG/ML  SOLN
100.0000 mL | Freq: Once | INTRAMUSCULAR | Status: AC | PRN
  Administered 2014-08-18: 80 mL via INTRAVENOUS

## 2014-08-18 NOTE — ED Notes (Addendum)
Pt states he's sick and doesn't want to go home

## 2014-08-18 NOTE — ED Notes (Signed)
Bed: WA06 Expected date:  Expected time:  Means of arrival:  Comments: 81yo stomach pain

## 2014-08-18 NOTE — Discharge Instructions (Signed)
See your GI doctor at [facility] as scheduled.   Stay hydrated.   Your INR is 3.2. Hold today's dose and restart same dose tomorrow.   Return to ER if you have vomiting, severe pain, fevers.

## 2014-08-18 NOTE — ED Notes (Signed)
[place] RN AT BS SPEAKING WITH PT

## 2014-08-18 NOTE — ED Provider Notes (Signed)
CSN: [id number]     Arrival date & time 08/18/14  1054 History   First MD Initiated Contact with Patient 08/18/14 1059     Chief Complaint  Patient presents with  . Abdominal Pain  . Diarrhea     (Consider location/radiation/quality/duration/timing/severity/associated sxs/prior Treatment) The history is provided by the patient.  [name] is a 81 y.o. male hx of GERD, PE and DVT on coumadin, diabetes, chronic abdominal pain who presented with worsening of his chronic pain. Patient was seen at [facility] and in the ED multiple times for his right lower quadrant pain over the last year or so. Came to the ER 2 days ago and had normal abdominal x-rays as well as labs. Patient was thought to be constipation and was given MiraLAX. He has normal bowel movements but has worsening pain. He states that he is constantly in pain and pain just didn't go away. Has occasional nausea and vomiting as well. Denies fevers. Patient suppose to see GI at [facility] in 3 days. Has seen multiple specialist with no diagnosis.     Past Medical History  Diagnosis Date  . GERD (gastroesophageal reflux disease)   . Chronic back pain   . Gout     no problems in 2 years  . Pulmonary embolism, bilateral 2010  . Hyperlipemia   . Anginal pain   . DVT, lower extremity 1980's    LLE  . Asthma     "touch"  . Pneumonia 1970's  . Arthritis     "mostly in my knees and shoulders"  . Collapsed lung     "for 14 years" right   . Diabetes mellitus   . Hypertension   . Depression   . Anxiety   . Hearing loss   . Glaucoma   . Complication of anesthesia   . PONV (postoperative nausea and vomiting)   . Renal disorder ~ 03/2011    "wasn't functioning right"  . Stroke   . Stroke     "just a little one"  . Hypothyroidism   . Chronic abdominal pain   . Paroxysmal atrial fibrillation   . Myocardial infarction 1987; 1989  . Coronary artery disease    Past Surgical History  Procedure Laterality Date  .  Esophagogastroduodenoscopy  12/22/2010    Procedure: ESOPHAGOGASTRODUODENOSCOPY (EGD);  Surgeon: Theda Belfast;  Location: [facility] ENDOSCOPY;  Service: Endoscopy;  Laterality: N/A;  . Appendectomy  1950  . Cataract extraction w/ intraocular lens  implant, bilateral Bilateral 1990's  . Coronary angioplasty with stent placement      "think I have 4 total"  . Inguinal hernia repair Bilateral 03/28/2012    Procedure: LAPAROSCOPIC BILATERAL INGUINAL HERNIA REPAIR;  Surgeon: Ardeth Sportsman, MD;  Location: [facility] ORS;  Service: General;  Laterality: Bilateral;  . Insertion of mesh Bilateral 03/28/2012    Procedure: INSERTION OF MESH;  Surgeon: Ardeth Sportsman, MD;  Location: [facility] ORS;  Service: General;  Laterality: Bilateral;  . Hernia repair    . Esophagogastroduodenoscopy N/A 04/30/2012    Procedure: ESOPHAGOGASTRODUODENOSCOPY (EGD);  Surgeon: Theda Belfast, MD;  Location: [facility] ENDOSCOPY;  Service: Endoscopy;  Laterality: N/A;  . Colonoscopy N/A 05/01/2012    Procedure: COLONOSCOPY;  Surgeon: Theda Belfast, MD;  Location: [facility] ENDOSCOPY;  Service: Endoscopy;  Laterality: N/A;  . Cardiac catheterization    . Left heart catheterization with coronary angiogram N/A 02/19/2011    Procedure: LEFT HEART CATHETERIZATION WITH CORONARY ANGIOGRAM;  Surgeon: Robynn Pane, MD;  Location:  [facility] CATH LAB;  Service: Cardiovascular;  Laterality: N/A;   Family History  Problem Relation Age of Onset  . Diabetes Mother   . Heart disease Father   . Lung cancer Brother   . Stroke Neg Hx   . Hypertension Neg Hx   . Hyperlipidemia Neg Hx   . Alcohol abuse Neg Hx    History  Substance Use Topics  . Smoking status: Former Smoker -- 2.00 packs/day for 3 years    Types: Cigarettes    Start date: 01/27/1946    Quit date: 01/22/1981  . Smokeless tobacco: Never Used  . Alcohol Use: No     Comment: 07/12/2011; "last drink of alcohol was ~ 1983"    Review of Systems  Gastrointestinal: Positive for nausea, vomiting, abdominal pain  and diarrhea.  All other systems reviewed and are negative.     Allergies  Review of patient's allergies indicates no known allergies.  Home Medications   Prior to Admission medications   Medication Sig Start Date End Date Taking? Authorizing Provider  acetaminophen (TYLENOL) 650 MG CR tablet Take 1,300 mg by mouth every 8 (eight) hours as needed for pain.   Yes Historical Provider, MD  amiodarone (PACERONE) 200 MG tablet Take 200 mg by mouth daily.    Yes Historical Provider, MD  atorvastatin (LIPITOR) 40 MG tablet Take 40 mg by mouth daily.   Yes Historical Provider, MD  carvedilol (COREG) 3.125 MG tablet Take 3.125 mg by mouth 2 (two) times daily with a meal. 8am, 6pm   Yes Historical Provider, MD  docusate sodium (COLACE) 100 MG capsule Take 100 mg by mouth daily.   Yes Historical Provider, MD  dorzolamide-timolol (COSOPT) 22.3-6.8 MG/ML ophthalmic solution Place 1 drop into the right eye daily.    Yes Historical Provider, MD  fluticasone (FLONASE) 50 MCG/ACT nasal spray Place 1 spray into both nostrils 2 (two) times daily.  08/13/13  Yes Historical Provider, MD  furosemide (LASIX) 40 MG tablet Take 40 mg by mouth daily.    Yes Historical Provider, MD  isosorbide mononitrate (IMDUR) 60 MG 24 hr tablet Take 60 mg by mouth daily.   Yes Historical Provider, MD  levothyroxine (SYNTHROID, LEVOTHROID) 50 MCG tablet Take 50 mcg by mouth daily before breakfast.    Yes Historical Provider, MD  omeprazole (PRILOSEC) 40 MG capsule Take 40 mg by mouth daily after breakfast.    Yes Historical Provider, MD  polyethylene glycol powder (GLYCOLAX/MIRALAX) powder Take 1 Container by mouth once.   Yes Historical Provider, MD  ranitidine (ZANTAC) 300 MG tablet Take 300 mg by mouth daily.  01/04/14  Yes Historical Provider, MD  spironolactone (ALDACTONE) 25 MG tablet Take 25 mg by mouth daily.   Yes Historical Provider, MD  tamsulosin (FLOMAX) 0.4 MG CAPS capsule Take 0.8 mg by mouth at bedtime.    Yes  Historical Provider, MD  warfarin (COUMADIN) 4 MG tablet Take 2-4 mg by mouth daily at 6 PM. Take 1/2 tablet (2 mg) on Sundays, and take 1 tablet (4 mg) all other days of the week   Yes Historical Provider, MD  nitroGLYCERIN (NITROSTAT) 0.4 MG SL tablet Place 1 tablet (0.4 mg total) under the tongue every 5 (five) minutes as needed for chest pain. Patient not taking: Reported on 08/18/2014 04/24/14   Garlon Hatchet, PA-C  ondansetron [facility] ODT) 4 MG disintegrating tablet 4mg  ODT q4 hours prn nausea/vomit Patient not taking: Reported on 08/18/2014 03/27/14   Purvis Sheffield, MD  BP 168/71 mmHg  Pulse 62  Temp(Src) 98 F (36.7 C) (Oral)  Resp 16  Ht 5' 8.5" (1.74 m)  Wt 185 lb (83.915 kg)  BMI 27.72 kg/m2  SpO2 98% Physical Exam  Constitutional: He is oriented to person, place, and time.  Chronically ill, vomiting   HENT:  Head: Normocephalic.  Eyes: Conjunctivae are normal. Pupils are equal, round, and reactive to light.  Neck: Normal range of motion. Neck supple.  Cardiovascular: Normal rate, regular rhythm and normal heart sounds.   Pulmonary/Chest: Effort normal and breath sounds normal. No respiratory distress. He has no wheezes. He has no rales.  Abdominal: Soft. Bowel sounds are normal.  Minimal RLQ tenderness, no rebound   Musculoskeletal: Normal range of motion. He exhibits no edema or tenderness.  Neurological: He is alert and oriented to person, place, and time. No cranial nerve deficit. Coordination normal.  Skin: Skin is warm and dry.  Psychiatric: He has a normal mood and affect. His behavior is normal. Judgment and thought content normal.  Nursing note and vitals reviewed.   ED Course  Procedures (including critical care time) Labs Review Labs Reviewed  CBC WITH DIFFERENTIAL/PLATELET - Abnormal; Notable for the following:    Hemoglobin 12.7 (*)    HCT 37.3 (*)    All other components within normal limits  COMPREHENSIVE METABOLIC PANEL - Abnormal; Notable for the  following:    Glucose, Bld 102 (*)    BUN 23 (*)    Creatinine, Ser 2.07 (*)    GFR calc non Af Amer 28 (*)    GFR calc Af Amer 33 (*)    All other components within normal limits  PROTIME-INR - Abnormal; Notable for the following:    Prothrombin Time 32.2 (*)    INR 3.20 (*)    All other components within normal limits  LIPASE, BLOOD  URINALYSIS, ROUTINE W REFLEX MICROSCOPIC (NOT AT [facility])  I-STAT TROPOININ, ED    Imaging Review Ct Abdomen Pelvis W Contrast  08/18/2014   CLINICAL DATA:  Right lower quadrant pain for 11 months. Diarrhea. Abdominal pain.  EXAM: CT ABDOMEN AND PELVIS WITH CONTRAST  TECHNIQUE: Multidetector CT imaging of the abdomen and pelvis was performed using the standard protocol following bolus administration of intravenous contrast.  CONTRAST:  80mL OMNIPAQUE IOHEXOL 300 MG/ML  SOLN  COMPARISON:  03/26/2014, 01/10/2014  FINDINGS: Lower chest: Mild right middle lobe scarring. Mild bibasilar atelectasis versus scarring. Coronary artery atherosclerosis in the RCA.  Hepatobiliary: 8 mm hypodense, fluid attenuating left hepatic mass most consistent with a small cyst. No other focal hepatic abnormality. Normal gallbladder. No intrahepatic or extrahepatic biliary ductal dilatation. Focal soft tissue nodule measuring 8 mm at the ampulla of Vater.  Pancreas: Normal.  Spleen: Normal.  Adrenals/Urinary Tract: Small hypodense left adrenal mass measuring 12 mm unchanged compared with 06/06/2012 likely representing an adrenal adenoma. Normal right adrenal gland. Bilateral simple, stable renal cysts. No obstructive uropathy or urolithiasis.  Stomach/Bowel: No bowel dilatation or bowel wall thickening. No pneumatosis, pneumoperitoneum or portal venous gas. No abdominal or pelvic free fluid. There is diverticulosis without evidence of diverticulitis.  Vascular/Lymphatic: Normal caliber abdominal aorta with atherosclerosis. Atherosclerotic plaque at the origin of bilateral renal arteries and SMA.  No abdominal or pelvic lymphadenopathy.  Reproductive: Severe prostatic enlargement with impression on the bladder base.  Other: No fluid collection or hematoma.  Musculoskeletal: No acute osseous abnormality. No lytic or sclerotic osseous lesion. Lumbar spine spondylosis most severe at L3-4. Severe degenerative  disc disease with disc height loss at L3-4 with bilateral moderate facet arthropathy. Severe right facet arthropathy at L5-S1.  IMPRESSION: 1. No acute abdominal or pelvic pathology. 2. Focal soft tissue nodule measuring 8 mm at the ampulla of Vater. Recommend further evaluation with upper endoscopy. 3. Severe prostatic enlargement with impression on the bladder base.   Electronically Signed   By: Elige Ko   On: 08/18/2014 13:22     EKG Interpretation None      MDM   Final diagnoses:  Abdominal pain    Leeon Makar is a 81 y.o. male here with ab pain, vomiting. Last CT was several months ago, neg xrays recently. Will check labs, UA, repeat CT. Likely worsening chronic ab pain.   3:18 PM Labs at baseline. INR 3.2, will have him told today's dose. No vomiting in the ED. CT showed no acute changes. Pain controlled. Has f/u in 3 days at [facility]. Stable for dc.    Richardean Canal, MD 08/18/14 [phone]

## 2014-08-18 NOTE — ED Notes (Addendum)
Per [facility] - Pt c/o of RLQ pain x 11 months. Pt c/o of GERD. Pt has appt with GI 08/22/14 at [facility]. Here for evaluation. Diarrhea r/t laxatives

## 2014-08-18 NOTE — ED Notes (Signed)
MD at bedside. 

## 2014-09-08 ENCOUNTER — Ambulatory Visit (INDEPENDENT_AMBULATORY_CARE_PROVIDER_SITE_OTHER): Payer: Medicare Other | Admitting: Nurse Practitioner

## 2014-09-08 ENCOUNTER — Encounter: Payer: Self-pay | Admitting: Nurse Practitioner

## 2014-09-08 VITALS — BP 144/70 | HR 62 | Ht 68.5 in | Wt 184.4 lb

## 2014-09-08 DIAGNOSIS — G8929 Other chronic pain: Secondary | ICD-10-CM

## 2014-09-08 DIAGNOSIS — R1031 Right lower quadrant pain: Secondary | ICD-10-CM | POA: Diagnosis not present

## 2014-09-10 ENCOUNTER — Encounter: Payer: Self-pay | Admitting: Nurse Practitioner

## 2014-09-10 DIAGNOSIS — R1031 Right lower quadrant pain: Principal | ICD-10-CM

## 2014-09-10 DIAGNOSIS — G8929 Other chronic pain: Secondary | ICD-10-CM | POA: Insufficient documentation

## 2014-09-10 NOTE — Progress Notes (Addendum)
     History of Present Illness:   Patient is an 81 year old male who I saw two years ago for a "second opinion" of his lower abdominal pain. Patient had previously been evaluated at [facility] but was also currently seeing Dr. Elnoria Howard at the time I saw him. Patient didn't bring any records from Dr. Elnoria Howard who he was well established with. The plan was to get records and then reschedule his appointment.  For unclear reasons patient's records were not received and he didn't call back for an appointment.  Of note, he was last seen at [facility] August 2014. Per that office note patient had an unremarkable upper endoscopy, colonoscopy, CT scan, MRI, and mesenteric doppler study.  His endoscopic studies were done by Dr. Elnoria Howard.  Pain felt to be originating from the abdominal wall.  Of note, a cursory review of EPIC records reveals that since last visit at [facility] in 2014 patient has had 5 CTscans in [place], none of which revealed any acute lower abdominal abnormalities.   Patient referred now by Dr. Sharyn Lull for persistent abdominal pain. Patient accompanied by his son, they still want a second opinion about the lower abdominal pain. Patient hasn't been seen lately by Dr. Elnoria Howard but states that through the years he has been numerous times by Dr. Elnoria Howard. Unfortunately I still don't have any records.   Current Medications, Allergies, Past Medical History, Past Surgical History, Family History and Social History were reviewed in [employer] record.  Assessment and Recommendations:  1. Pleasant 81 year old male with chronic, unexplained RLQ pain. Extensive workup by Dr. Elnoria Howard and also at [facility] has been unremarkable. Patient was well established with Dr Elnoria Howard (?until a year ago). We need those records to render the second opinion being requested. Patient's son will go today and get records from Dr. Elnoria Howard. Once I receive records I will reschedule him to see Dr. Rhea Belton.   2. Abnormal  CTscan July 2016. A focal soft tissue mass was seen at ampulla of vater. This will be addressed at his visit.    3. Multiple medical problems as listed in PMH.   Addendum: Reviewed and agree with initial management.  Will also ask Dr. Christella Hartigan to review as EUS may be the best best to evaluate this lesion seen by CT Beverley Fiedler, MD

## 2014-09-10 NOTE — Progress Notes (Signed)
i reviewed the CT.  I recommend evaluation with side viewing duodenoscope and if normal would use EUS scope. Could be done at the same time.  I'd be happy to do this but in setting of "second opinion" for this Dr. Elnoria Howard patient I'm not sure of my role. I doubt the finding has anything to do with his lower abd pains and the fact that his LFTs are normal is reassuring.  Happy to help if needed.

## 2014-09-10 NOTE — Addendum Note (Signed)
Addended by: Beverley Fiedler on: 09/10/2014 11:49 AM   Modules accepted: Level of Service

## 2014-09-15 ENCOUNTER — Telehealth: Payer: Self-pay

## 2014-09-15 NOTE — Telephone Encounter (Signed)
Meredith Pel, NP   Sent: Tue September 14, 2014 10:30 AM    To: Annett Fabian, RN        Message     [name],     Please call son, thank him for getting records from [place]. Then please schedule him for an appointment with dr pyrtle for a second opinion of chronic abdominal pain (seen previously by [facility] and Dr. Elnoria Howard). Also, please let son know that I looked at his most recent CTscan and there was an abnormal finding in small bowel and patient may need an EUS. This is an incidental finding, most likely has nothing to do with his chronic RLQ pain. Thanks        ----- Message -----     From: Beverley Fiedler, MD     Sent: 09/10/2014 11:49 AM      To: Rachael Fee, MD, Meredith Pel, NP        I left a message for the family to call back to schedule a follow up

## 2014-09-16 NOTE — Telephone Encounter (Signed)
I spoke with his son Graciella Belton.  Follow up appt made for 11/16/14 11:00.   He is also notified of the possibility of EUS and that it will be discussed further at follow up with Dr. Rhea Belton

## 2014-10-16 IMAGING — CT CT ABD-PELV W/O CM
1 of 2 series · 14 of 32 positions shown, 18 images · non-contrast
Comparison: CT 05/10/2012.

CLINICAL DATA: Abdominal pain.  Right lower quadrant pain.  History
of herniorrhaphy.

CT ABDOMEN AND PELVIS WITHOUT CONTRAST
TECHNIQUE: Multidetector CT imaging of the abdomen and pelvis was
performed following the standard protocol without intravenous
contrast.

[Series 2: abd/pel w/o · axial · non-contrast · 0.74mm/px · z∈[+1210,+1580]mm · 14 of 85 slices shown, 18 images]
[im 7/85  soft-tissue]
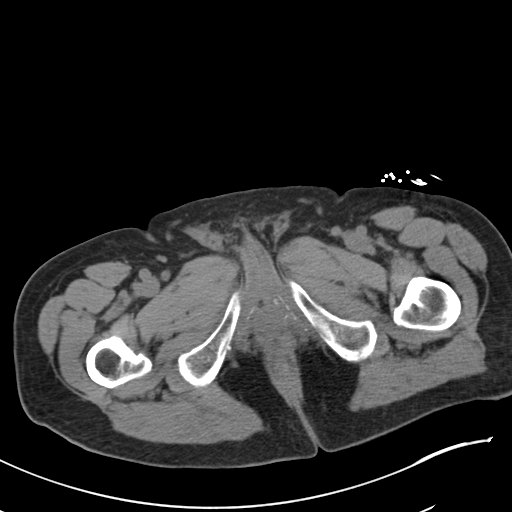
[im 7/85  bone]
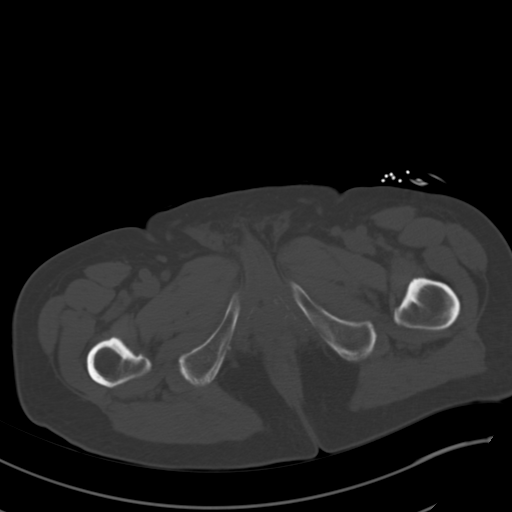
[im 14/85  soft-tissue]
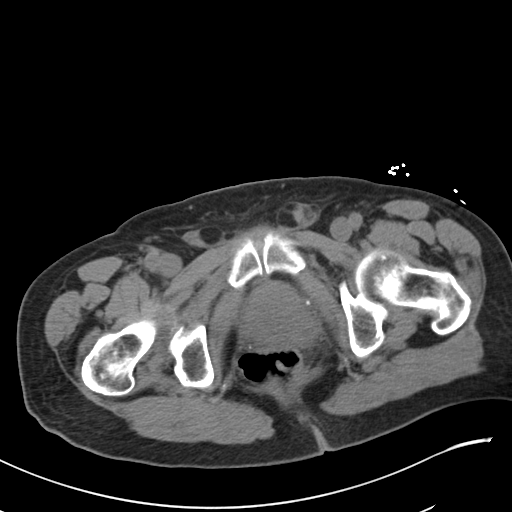
[im 21/85  soft-tissue]
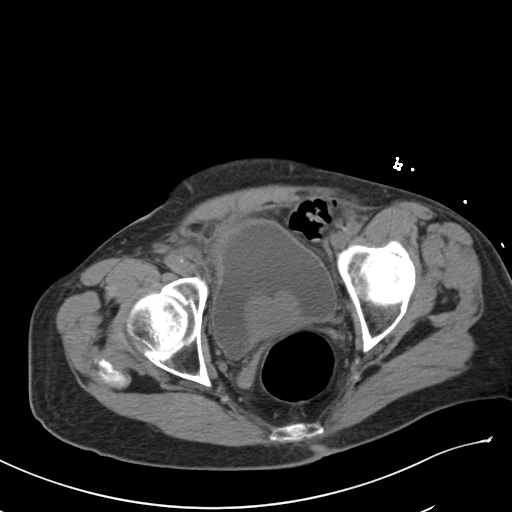
[im 27/85  soft-tissue]
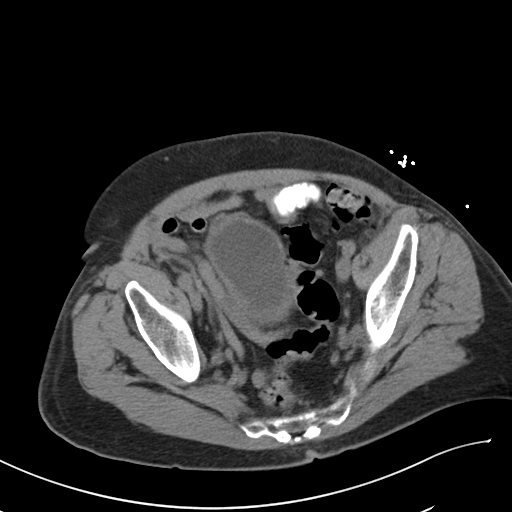
[im 34/85  soft-tissue]
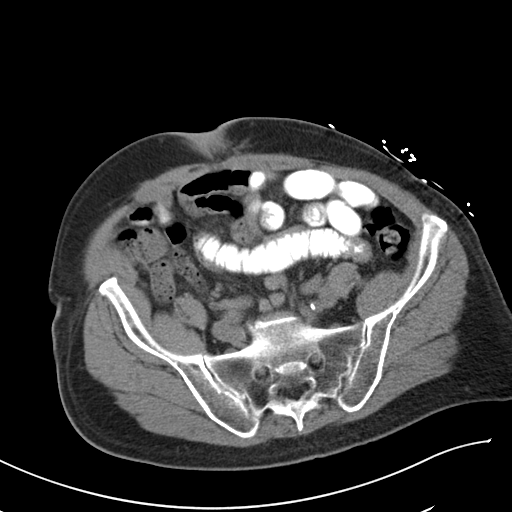
[im 41/85  soft-tissue]
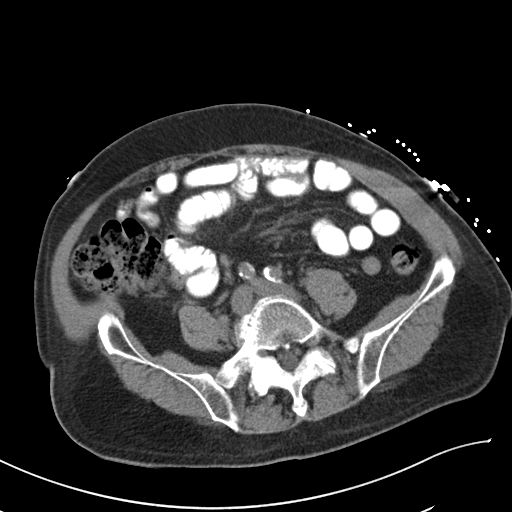
[im 48/85  soft-tissue]
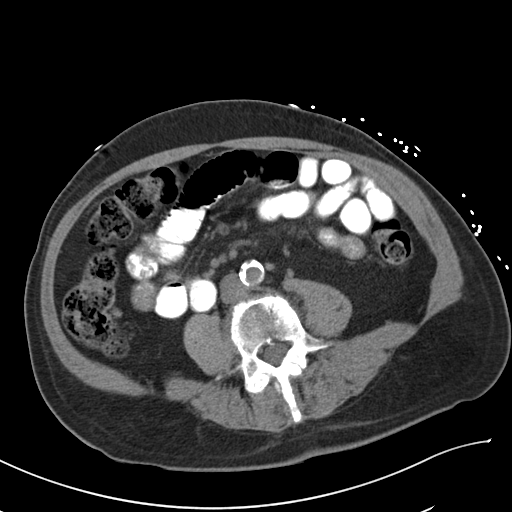
[im 54/85  soft-tissue]
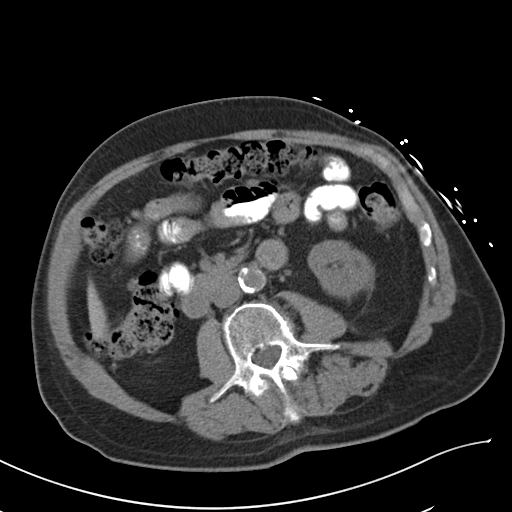
[im 61/85  soft-tissue]
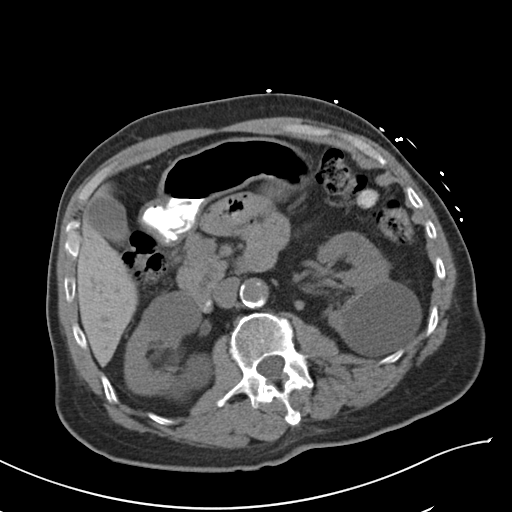
[im 61/85  bone]
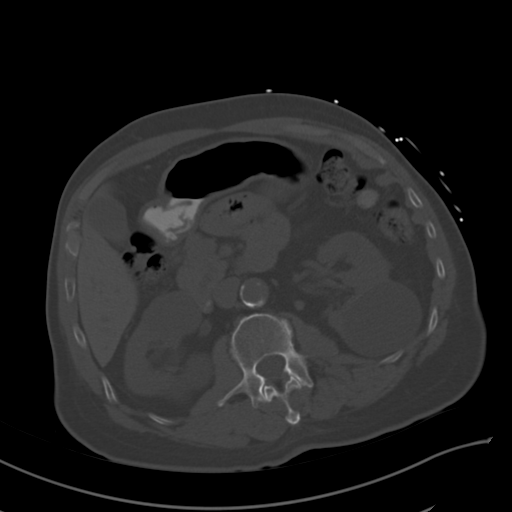
[im 68/85  soft-tissue]
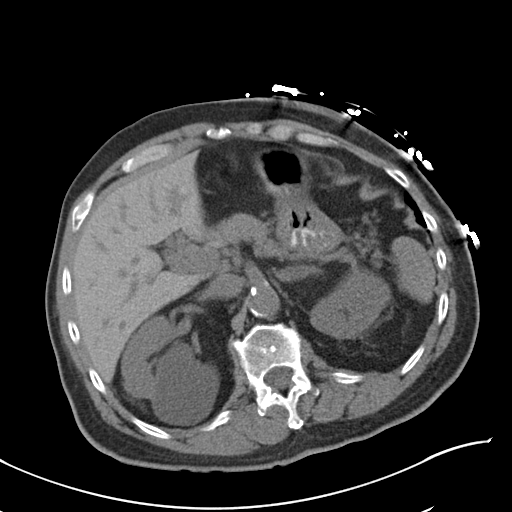
[im 71/85  lung]
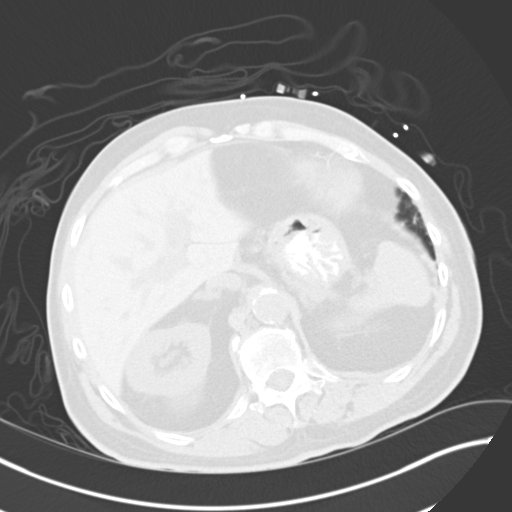
[im 74/85  soft-tissue]
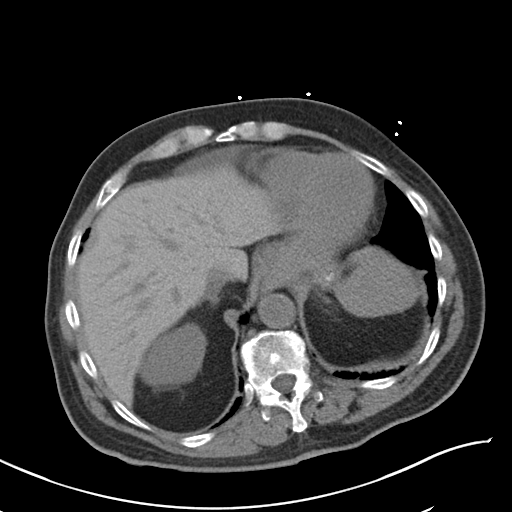
[im 74/85  lung]
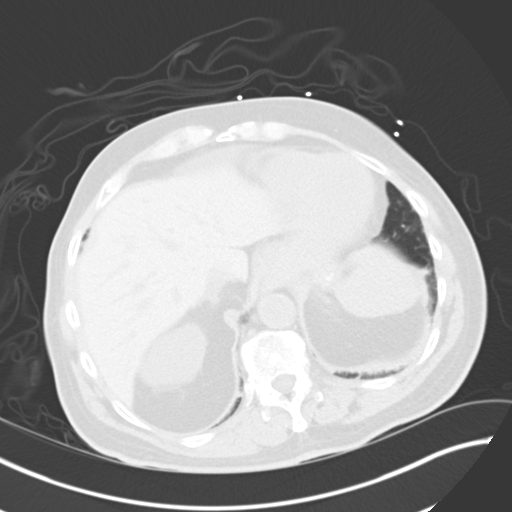
[im 78/85  lung]
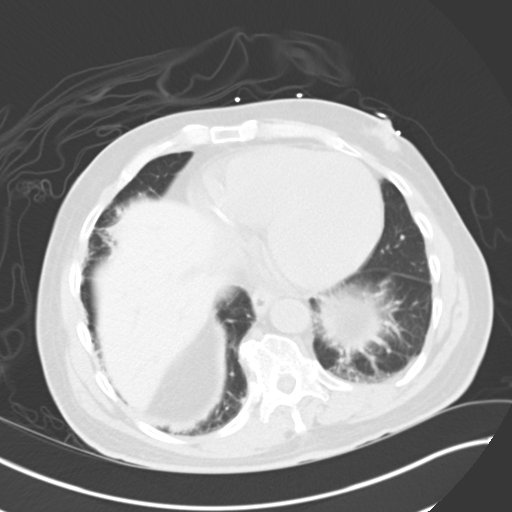
[im 81/85  soft-tissue]
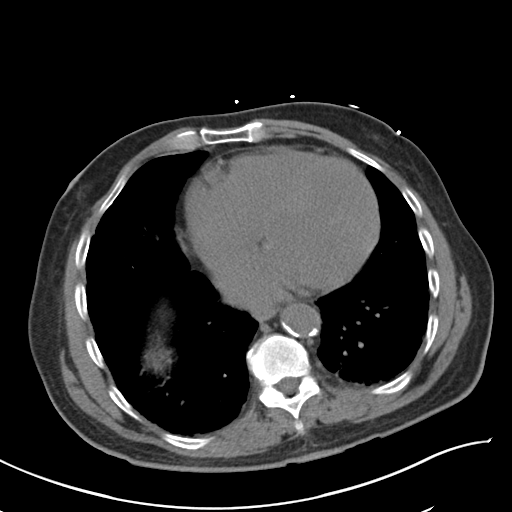
[im 81/85  lung]
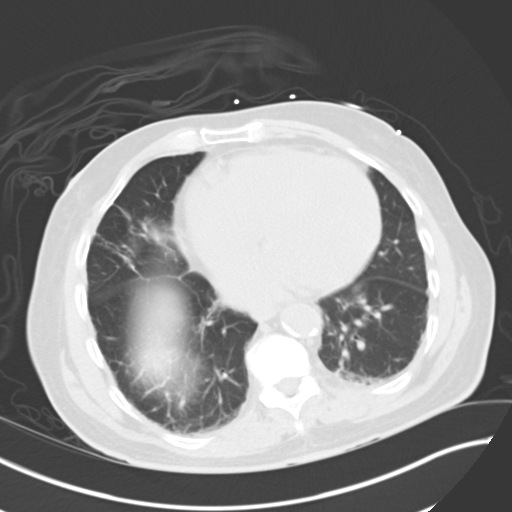

[14 of 32 positions shown; findings below may reference images not displayed]

FINDINGS: Lung Bases: Dependent atelectasis and scarring is present
in the lower lobes.  Traction bronchiectasis in the right middle
lobe.  Small amount of anterior pericardial fluid or thickening.
Cardiomegaly.  Low attenuation intravascular compartment suggests
anemia. No effusion or airspace disease.

Liver:  No interval change.  Tiny low density lesion in the left
hepatic lobe appears similar.  Allowing for lack of contrast, with
portal veins and bile ducts appear unchanged compared to prior.

Spleen:  Normal.

Gallbladder:  Normal.  No calcified gallstones.

Common bile duct:  No interval change.  Grossly normal.

Pancreas:  No inflammatory changes or mass lesion allowing for
noncontrast CT. Unenhanced CT was performed per clinician order.
Lack of IV contrast limits sensitivity and specificity, especially
for evaluation of abdominal/pelvic solid viscera.

Adrenal glands:  Stable nodules in the left adrenal gland
compatible with adenomas.  Adrenal thickening consistent with
hyperplasia.

Kidneys:  No calculi or obstruction.  Bilateral renal cysts appears
similar to recent prior.   Both ureters appear within normal
limits.

Stomach:  Small hiatal hernia and patulous gastroesophageal
junction.  No gastric inflammatory changes.

Small bowel:  Duodenum appears normal.  Contrast is present in the
proximal small bowel.  There are a few loops of bowel with some
mural thickening in the central abdomen which appears greater than
expected for the degree of distention.  Although the appearance is
nonspecific, these findings are commonly associated with enteric
infection.  The same loops appeared within normal limits on the
recent prior examination.

Colon:   Appendix not identified.  Moderate stool burden.  No
inflammatory changes of the proximal colon.  Sigmoid
diverticulosis.

Pelvic Genitourinary:  Prostatomegaly with marked impression on the
inferior urinary bladder.  Bladder diverticulum at the dome.  Mural
thickening of the urinary bladder suggesting chronic bladder
outflow obstruction.  Stranding in the right inguinal region
suggesting prior herniorrhaphy.

Bones:  No aggressive osseous lesions.  Lumbar spondylosis and
facet arthrosis.

Vasculature: Atherosclerosis without acute vascular abnormality.

Stranding in the periumbilical region also suggesting
herniorrhaphy.
IMPRESSION: 1.  Thickened loops of small bowel in the central upper abdomen.
Although nonspecific, this is most commonly associated with enteric
infection.  Small bowel ischemia is considered unlikely.
Inflammatory bowel disease also considered less likely in this age
group.  No perforation or pneumatosis.
2.  Other incidental findings detailed above appears similar to the
prior exam.

## 2014-10-28 ENCOUNTER — Encounter: Payer: Self-pay | Admitting: *Deleted

## 2014-11-16 ENCOUNTER — Ambulatory Visit: Payer: Medicare Other | Admitting: Internal Medicine

## 2015-01-18 ENCOUNTER — Emergency Department (HOSPITAL_COMMUNITY)
Admission: EM | Admit: 2015-01-18 | Discharge: 2015-01-19 | Disposition: A | Discharge status: Home / Self Care | Payer: Medicare Other | Attending: Emergency Medicine | Admitting: Emergency Medicine

## 2015-01-18 ENCOUNTER — Encounter (HOSPITAL_COMMUNITY): Payer: Self-pay | Admitting: Emergency Medicine

## 2015-01-18 DIAGNOSIS — M1990 Unspecified osteoarthritis, unspecified site: Secondary | ICD-10-CM | POA: Diagnosis not present

## 2015-01-18 DIAGNOSIS — Z7901 Long term (current) use of anticoagulants: Secondary | ICD-10-CM | POA: Diagnosis not present

## 2015-01-18 DIAGNOSIS — Z86711 Personal history of pulmonary embolism: Secondary | ICD-10-CM | POA: Diagnosis not present

## 2015-01-18 DIAGNOSIS — Z79899 Other long term (current) drug therapy: Secondary | ICD-10-CM | POA: Diagnosis not present

## 2015-01-18 DIAGNOSIS — Z86718 Personal history of other venous thrombosis and embolism: Secondary | ICD-10-CM | POA: Insufficient documentation

## 2015-01-18 DIAGNOSIS — Z87448 Personal history of other diseases of urinary system: Secondary | ICD-10-CM | POA: Insufficient documentation

## 2015-01-18 DIAGNOSIS — Z7951 Long term (current) use of inhaled steroids: Secondary | ICD-10-CM | POA: Diagnosis not present

## 2015-01-18 DIAGNOSIS — Z87891 Personal history of nicotine dependence: Secondary | ICD-10-CM | POA: Insufficient documentation

## 2015-01-18 DIAGNOSIS — E785 Hyperlipidemia, unspecified: Secondary | ICD-10-CM | POA: Insufficient documentation

## 2015-01-18 DIAGNOSIS — K219 Gastro-esophageal reflux disease without esophagitis: Secondary | ICD-10-CM | POA: Diagnosis not present

## 2015-01-18 DIAGNOSIS — H9190 Unspecified hearing loss, unspecified ear: Secondary | ICD-10-CM | POA: Insufficient documentation

## 2015-01-18 DIAGNOSIS — Z9889 Other specified postprocedural states: Secondary | ICD-10-CM | POA: Diagnosis not present

## 2015-01-18 DIAGNOSIS — I252 Old myocardial infarction: Secondary | ICD-10-CM | POA: Diagnosis not present

## 2015-01-18 DIAGNOSIS — H409 Unspecified glaucoma: Secondary | ICD-10-CM | POA: Diagnosis not present

## 2015-01-18 DIAGNOSIS — Z8701 Personal history of pneumonia (recurrent): Secondary | ICD-10-CM | POA: Insufficient documentation

## 2015-01-18 DIAGNOSIS — I872 Venous insufficiency (chronic) (peripheral): Secondary | ICD-10-CM | POA: Insufficient documentation

## 2015-01-18 DIAGNOSIS — E119 Type 2 diabetes mellitus without complications: Secondary | ICD-10-CM | POA: Diagnosis not present

## 2015-01-18 DIAGNOSIS — Z8673 Personal history of transient ischemic attack (TIA), and cerebral infarction without residual deficits: Secondary | ICD-10-CM | POA: Diagnosis not present

## 2015-01-18 DIAGNOSIS — I25119 Atherosclerotic heart disease of native coronary artery with unspecified angina pectoris: Secondary | ICD-10-CM | POA: Insufficient documentation

## 2015-01-18 DIAGNOSIS — J45909 Unspecified asthma, uncomplicated: Secondary | ICD-10-CM | POA: Insufficient documentation

## 2015-01-18 DIAGNOSIS — Z8659 Personal history of other mental and behavioral disorders: Secondary | ICD-10-CM | POA: Diagnosis not present

## 2015-01-18 DIAGNOSIS — I10 Essential (primary) hypertension: Secondary | ICD-10-CM | POA: Insufficient documentation

## 2015-01-18 DIAGNOSIS — M7989 Other specified soft tissue disorders: Secondary | ICD-10-CM | POA: Diagnosis present

## 2015-01-18 DIAGNOSIS — Z9861 Coronary angioplasty status: Secondary | ICD-10-CM | POA: Diagnosis not present

## 2015-01-18 DIAGNOSIS — I480 Paroxysmal atrial fibrillation: Secondary | ICD-10-CM | POA: Diagnosis not present

## 2015-01-18 DIAGNOSIS — E039 Hypothyroidism, unspecified: Secondary | ICD-10-CM | POA: Diagnosis not present

## 2015-01-18 DIAGNOSIS — G8929 Other chronic pain: Secondary | ICD-10-CM | POA: Diagnosis not present

## 2015-01-18 LAB — COMPREHENSIVE METABOLIC PANEL
ALBUMIN: 3.1 g/dL — AB (ref 3.5–5.0)
ALT: 15 U/L — ABNORMAL LOW (ref 17–63)
AST: 17 U/L (ref 15–41)
Alkaline Phosphatase: 61 U/L (ref 38–126)
Anion gap: 8 (ref 5–15)
BUN: 22 mg/dL — AB (ref 6–20)
CO2: 28 mmol/L (ref 22–32)
Calcium: 9.3 mg/dL (ref 8.9–10.3)
Chloride: 108 mmol/L (ref 101–111)
Creatinine, Ser: 1.98 mg/dL — ABNORMAL HIGH (ref 0.61–1.24)
GFR calc Af Amer: 34 mL/min — ABNORMAL LOW (ref >60)
GFR calc non Af Amer: 30 mL/min — ABNORMAL LOW (ref >60)
GLUCOSE: 106 mg/dL — AB (ref 65–99)
POTASSIUM: 4.7 mmol/L (ref 3.5–5.1)
Sodium: 144 mmol/L (ref 135–145)
Total Bilirubin: 0.6 mg/dL (ref 0.3–1.2)
Total Protein: 6.2 g/dL — ABNORMAL LOW (ref 6.5–8.1)

## 2015-01-18 LAB — CBC WITH DIFFERENTIAL/PLATELET
BASOS ABS: 0 10*3/uL (ref 0.0–0.1)
BASOS PCT: 0 %
EOS ABS: 0.1 10*3/uL (ref 0.0–0.7)
Eosinophils Relative: 1 %
HCT: 37.9 % — ABNORMAL LOW (ref 39.0–52.0)
Hemoglobin: 12.8 g/dL — ABNORMAL LOW (ref 13.0–17.0)
Lymphocytes Relative: 28 %
Lymphs Abs: 1.9 10*3/uL (ref 0.7–4.0)
MCH: 27.9 pg (ref 26.0–34.0)
MCHC: 33.8 g/dL (ref 30.0–36.0)
MCV: 82.8 fL (ref 78.0–100.0)
MONO ABS: 0.6 10*3/uL (ref 0.1–1.0)
Monocytes Relative: 8 %
NEUTROS ABS: 4.5 10*3/uL (ref 1.7–7.7)
Neutrophils Relative %: 63 %
Platelets: 142 10*3/uL — ABNORMAL LOW (ref 150–400)
RBC: 4.58 MIL/uL (ref 4.22–5.81)
RDW: 15.1 % (ref 11.5–15.5)
WBC: 7 10*3/uL (ref 4.0–10.5)

## 2015-01-18 LAB — URINALYSIS, ROUTINE W REFLEX MICROSCOPIC
Bilirubin Urine: NEGATIVE
GLUCOSE, UA: NEGATIVE mg/dL
Hgb urine dipstick: NEGATIVE
Ketones, ur: NEGATIVE mg/dL
Leukocytes, UA: NEGATIVE
Nitrite: NEGATIVE
PH: 7 (ref 5.0–8.0)
PROTEIN: NEGATIVE mg/dL
Specific Gravity, Urine: 1.008 (ref 1.005–1.030)

## 2015-01-18 NOTE — ED Provider Notes (Signed)
CSN: [id number]     Arrival date & time 01/18/15  1811 History  By signing my name below, I, [name], attest that this documentation has been prepared under the direction and in the presence of Blake Bilis, MD. Electronically Signed: Bethel [name], ED Scribe. 01/18/2015. 11:56 PM Chief Complaint  Patient presents with  . Leg Pain    The patient called from home saying he has bilateral leg pain.  EMS said he does have swelling to his legs, but he does take lasix.  He rates his pain 8/10.  . Leg Swelling     The history is provided by the patient. No language interpreter was used.   [name] is a 81 y.o. male with history of HTN, HLD, DM, a renal disorder, gout, and arthritis,  who presents to the Emergency Department complaining of worsened bilateral leg swelling and pain with onset 2 weeks ago. The patient states that the pain and swelling started in his left leg and he thought it was gout but then the symptoms spread to his right leg. He called his PCP and was advised to decrease his water intake but that has not provided much improvement. He does not walk much but elevates the lower extremities at night. He does not wear compression stockings. Pt denies SOB.   Past Medical History  Diagnosis Date  . GERD (gastroesophageal reflux disease)   . Chronic back pain   . Gout     no problems in 2 years  . Pulmonary embolism, bilateral (HCC) 2010  . Hyperlipemia   . Anginal pain (HCC)   . DVT, lower extremity (HCC) 1980's    LLE  . Asthma     "touch"  . Pneumonia 1970's  . Arthritis     "mostly in my knees and shoulders"  . Collapsed lung     "for 14 years" right   . Diabetes mellitus   . Hypertension   . Depression   . Anxiety   . Hearing loss   . Glaucoma   . Complication of anesthesia   . PONV (postoperative nausea and vomiting)   . Renal disorder ~ 03/2011    "wasn't functioning right"  . Stroke [facility])     "just a little one"  . Hypothyroidism   . Chronic  abdominal pain   . Paroxysmal atrial fibrillation (HCC)   . Myocardial infarction (HCC) 1987; 1989  . Coronary artery disease   . Esophageal stricture   . Hiatal hernia   . Hemorrhoids   . Schatzki's ring    Past Surgical History  Procedure Laterality Date  . Esophagogastroduodenoscopy  12/22/2010    Procedure: ESOPHAGOGASTRODUODENOSCOPY (EGD);  Surgeon: Theda Belfast;  Location: [facility] ENDOSCOPY;  Service: Endoscopy;  Laterality: N/A;  . Appendectomy  1950  . Cataract extraction w/ intraocular lens  implant, bilateral Bilateral 1990's  . Coronary angioplasty with stent placement      "think I have 4 total"  . Inguinal hernia repair Bilateral 03/28/2012    Procedure: LAPAROSCOPIC BILATERAL INGUINAL HERNIA REPAIR;  Surgeon: Ardeth Sportsman, MD;  Location: [facility] ORS;  Service: General;  Laterality: Bilateral;  . Insertion of mesh Bilateral 03/28/2012    Procedure: INSERTION OF MESH;  Surgeon: Ardeth Sportsman, MD;  Location: [facility] ORS;  Service: General;  Laterality: Bilateral;  . Hernia repair    . Esophagogastroduodenoscopy N/A 04/30/2012    Procedure: ESOPHAGOGASTRODUODENOSCOPY (EGD);  Surgeon: Theda Belfast, MD;  Location: [facility] ENDOSCOPY;  Service: Endoscopy;  Laterality:  N/A;  . Colonoscopy N/A 05/01/2012    Procedure: COLONOSCOPY;  Surgeon: Theda Belfast, MD;  Location: [facility] ENDOSCOPY;  Service: Endoscopy;  Laterality: N/A;  . Cardiac catheterization    . Left heart catheterization with coronary angiogram N/A 02/19/2011    Procedure: LEFT HEART CATHETERIZATION WITH CORONARY ANGIOGRAM;  Surgeon: Robynn Pane, MD;  Location: [name] CATH LAB;  Service: Cardiovascular;  Laterality: N/A;   Family History  Problem Relation Age of Onset  . Diabetes Mother   . Heart disease Father   . Lung cancer Brother   . Stroke Father   . Hypertension Neg Hx   . Hyperlipidemia Neg Hx   . Alcohol abuse Neg Hx   . Colon cancer Neg Hx    Social History  Substance Use Topics  . Smoking status: Former Smoker -- 2.00  packs/day for 3 years    Types: Cigarettes    Start date: 01/27/1946    Quit date: 01/22/1981  . Smokeless tobacco: Never Used  . Alcohol Use: No     Comment: 07/12/2011; "last drink of alcohol was ~ 1983"    Review of Systems 10 Systems reviewed and all are negative for acute change except as noted in the HPI. Allergies  Review of patient's allergies indicates no known allergies.  Home Medications   Prior to Admission medications   Medication Sig Start Date End Date Taking? Authorizing Provider  acetaminophen (TYLENOL) 650 MG CR tablet Take 1,300 mg by mouth every 8 (eight) hours as needed for pain.    Historical Provider, MD  amiodarone (PACERONE) 200 MG tablet Take 200 mg by mouth daily.     Historical Provider, MD  atorvastatin (LIPITOR) 40 MG tablet Take 40 mg by mouth daily.    Historical Provider, MD  carvedilol (COREG) 3.125 MG tablet Take 3.125 mg by mouth 2 (two) times daily with a meal. 8am, 6pm    Historical Provider, MD  docusate sodium (COLACE) 100 MG capsule Take 100 mg by mouth daily.    Historical Provider, MD  dorzolamide-timolol (COSOPT) 22.3-6.8 MG/ML ophthalmic solution Place 1 drop into the right eye daily.     Historical Provider, MD  fluticasone (FLONASE) 50 MCG/ACT nasal spray Place 1 spray into both nostrils 2 (two) times daily.  08/13/13   Historical Provider, MD  furosemide (LASIX) 40 MG tablet Take 40 mg by mouth daily.     Historical Provider, MD  isosorbide mononitrate (IMDUR) 60 MG 24 hr tablet Take 60 mg by mouth daily.    Historical Provider, MD  levothyroxine (SYNTHROID, LEVOTHROID) 50 MCG tablet Take 50 mcg by mouth daily before breakfast.     Historical Provider, MD  nitroGLYCERIN (NITROSTAT) 0.4 MG SL tablet Place 1 tablet (0.4 mg total) under the tongue every 5 (five) minutes as needed for chest pain. 04/24/14   Garlon Hatchet, PA-C  omeprazole (PRILOSEC) 40 MG capsule Take 40 mg by mouth daily after breakfast.     Historical Provider, MD  ondansetron  (ZOFRAN ODT) 4 MG disintegrating tablet  ODT q4 hours prn nausea/vomit 03/27/14   Purvis Sheffield, MD  polyethylene glycol powder (GLYCOLAX/MIRALAX) powder Take 1 Container by mouth once.    Historical Provider, MD  ranitidine (ZANTAC) 300 MG tablet Take 300 mg by mouth daily.  01/04/14   Historical Provider, MD  spironolactone (ALDACTONE) 25 MG tablet Take 25 mg by mouth daily.    Historical Provider, MD  tamsulosin (FLOMAX) 0.4 MG CAPS capsule Take 0.8 mg by mouth at  bedtime.     Historical Provider, MD  warfarin (COUMADIN) 4 MG tablet Take 2-4 mg by mouth daily at 6 PM. Take 1/2 tablet (2 mg) on Sundays, and take 1 tablet (4 mg) all other days of the week    Historical Provider, MD   BP 178/87 mmHg  Pulse 68  Temp(Src) 98.1 F (36.7 C) (Oral)  Resp 20  SpO2 98% Physical Exam  Constitutional: He is oriented to person, place, and time. He appears well-developed and well-nourished.  HENT:  Head: Normocephalic and atraumatic.  Eyes: EOM are normal.  Neck: Normal range of motion.  Cardiovascular: Normal rate, regular rhythm, normal heart sounds and intact distal pulses.   Pulmonary/Chest: Effort normal and breath sounds normal. No respiratory distress.  Abdominal: Soft. He exhibits no distension. There is no tenderness.  Musculoskeletal: Normal range of motion. He exhibits edema.  B2ilateral 2+ pitting edema. Normal DP pulses bilaterally.  Neurological: He is alert and oriented to person, place, and time.  Skin: Skin is warm and dry.  Psychiatric: He has a normal mood and affect. Judgment normal.  Nursing note and vitals reviewed.   ED Course  Procedures (including critical care time) DIAGNOSTIC STUDIES: Oxygen Saturation is 98% on RA,  normal by my interpretation.    COORDINATION OF CARE: 11:42 PM Discussed treatment plan which includes lab work and discharge to f/u with Dr Sharyn LullHarwani, decrease his salt intake, and use compression stockings with pt at bedside and pt agreed to  plan.  Labs Review Labs Reviewed  CBC WITH DIFFERENTIAL/PLATELET - Abnormal; Notable for the following:    Hemoglobin 12.8 (*)    HCT 37.9 (*)    Platelets 142 (*)    All other components within normal limits  COMPREHENSIVE METABOLIC PANEL - Abnormal; Notable for the following:    Glucose, Bld 106 (*)    BUN 22 (*)    Creatinine, Ser 1.98 (*)    Total Protein 6.2 (*)    Albumin 3.1 (*)    ALT 15 (*)    GFR calc non Af Amer 30 (*)    GFR calc Af Amer 34 (*)    All other components within normal limits  URINALYSIS, ROUTINE W REFLEX MICROSCOPIC (NOT AT [facility])    Imaging Review No results found. I have personally reviewed and evaluated these lab results as part of my medical decision-making.   EKG Interpretation None      MDM   Final diagnoses:  Venous insufficiency    Venous insufficiency. Doubt DVT. No signs to suggest heart failure. Baseline renal insufficiency for pt. pcp follow up. Recommend salt restriction, elevation, compression stockings  I personally performed the services described in this documentation, which was scribed in my presence. The recorded information has been reviewed and is accurate.      Blake BilisKevin Iceis Knab, MD 01/19/15 [phone]

## 2015-01-18 NOTE — Discharge Instructions (Signed)
Please elevate your legs Restrict salt in your diet Purchase compression stockings from the pharmacy Please call your doctor for follow up.     Venous Stasis or Chronic Venous Insufficiency Chronic venous insufficiency, also called venous stasis, is a condition that affects the veins in the legs. The condition prevents blood from being pumped through these veins effectively. Blood may no longer be pumped effectively from the legs back to the heart. This condition can range from mild to severe. With proper treatment, you should be able to continue with an active life. CAUSES  Chronic venous insufficiency occurs when the vein walls become stretched, weakened, or damaged or when valves within the vein are damaged. Some common causes of this include:  High blood pressure inside the veins (venous hypertension).  Increased blood pressure in the leg veins from long periods of sitting or standing.  A blood clot that blocks blood flow in a vein (deep vein thrombosis).  Inflammation of a superficial vein (phlebitis) that causes a blood clot to form. RISK FACTORS Various things can make you more likely to develop chronic venous insufficiency, including:  Family history of this condition.  Obesity.  Pregnancy.  Sedentary lifestyle.  Smoking.  Jobs requiring long periods of standing or sitting in one place.  Being a certain age. Women in their 3640s and 7950s and men in their 2470s are more likely to develop this condition. SIGNS AND SYMPTOMS  Symptoms may include:   Varicose veins.  Skin breakdown or ulcers.  Reddened or discolored skin on the leg.  Brown, smooth, tight, and painful skin just above the ankle, usually on the inside surface (lipodermatosclerosis).  Swelling. DIAGNOSIS  To diagnose this condition, your health care provider will take a medical history and do a physical exam. The following tests may be ordered to confirm the diagnosis:  Duplex ultrasound--A procedure that  produces a picture of a blood vessel and nearby organs and also provides information on blood flow through the blood vessel.  Plethysmography--A procedure that tests blood flow.  A venogram, or venography--A procedure used to look at the veins using X-ray and dye. TREATMENT The goals of treatment are to help you return to an active life and to minimize pain or disability. Treatment will depend on the severity of the condition. Medical procedures may be needed for severe cases. Treatment options may include:   Use of compression stockings. These can help with symptoms and lower the chances of the problem getting worse, but they do not cure the problem.  Sclerotherapy--A procedure involving an injection of a material that "dissolves" the damaged veins. Other veins in the network of blood vessels take over the function of the damaged veins.  Surgery to remove the vein or cut off blood flow through the vein (vein stripping or laser ablation surgery).  Surgery to repair a valve. HOME CARE INSTRUCTIONS   Wear compression stockings as directed by your health care provider.  Only take over-the-counter or prescription medicines for pain, discomfort, or fever as directed by your health care provider.  Follow up with your health care provider as directed. SEEK MEDICAL CARE IF:   You have redness, swelling, or increasing pain in the affected area.  You see a red streak or line that extends up or down from the affected area.  You have a breakdown or loss of skin in the affected area, even if the breakdown is small.  You have an injury to the affected area. SEEK IMMEDIATE MEDICAL CARE IF:  You have an injury and open wound in the affected area.  Your pain is severe and does not improve with medicine.  You have sudden numbness or weakness in the foot or ankle below the affected area, or you have trouble moving your foot or ankle.  You have a fever or persistent symptoms for more than 2-3  days.  You have a fever and your symptoms suddenly get worse. MAKE SURE YOU:   Understand these instructions.  Will watch your condition.  Will get help right away if you are not doing well or get worse.   This information is not intended to replace advice given to you by your health care provider. Make sure you discuss any questions you have with your health care provider.   Document Released: 05/14/2006 Document Revised: 10/29/2012 Document Reviewed: 09/15/2012 Elsevier Interactive Patient Education [employer]

## 2015-01-18 NOTE — ED Notes (Signed)
Pt. reports bilateral lower legs swelling/edema with pain for several weeks . Denies injury or fall , no fever or chills.

## 2015-01-19 NOTE — ED Notes (Signed)
Taxi voucher for patient to get home.

## 2015-01-19 NOTE — ED Notes (Signed)
Pt calling niece Annabelle HarmanDana to come pick him up from the ED.

## 2015-06-18 ENCOUNTER — Emergency Department (HOSPITAL_COMMUNITY): Payer: Medicare HMO

## 2015-06-18 ENCOUNTER — Encounter (HOSPITAL_COMMUNITY): Payer: Self-pay

## 2015-06-18 ENCOUNTER — Emergency Department (HOSPITAL_COMMUNITY)
Admission: EM | Admit: 2015-06-18 | Discharge: 2015-06-19 | Disposition: A | Discharge status: Home / Self Care | Payer: Medicare HMO | Attending: Emergency Medicine | Admitting: Emergency Medicine

## 2015-06-18 DIAGNOSIS — H9190 Unspecified hearing loss, unspecified ear: Secondary | ICD-10-CM | POA: Diagnosis not present

## 2015-06-18 DIAGNOSIS — R2243 Localized swelling, mass and lump, lower limb, bilateral: Secondary | ICD-10-CM | POA: Diagnosis present

## 2015-06-18 DIAGNOSIS — I252 Old myocardial infarction: Secondary | ICD-10-CM | POA: Diagnosis not present

## 2015-06-18 DIAGNOSIS — Z86718 Personal history of other venous thrombosis and embolism: Secondary | ICD-10-CM | POA: Insufficient documentation

## 2015-06-18 DIAGNOSIS — Z8701 Personal history of pneumonia (recurrent): Secondary | ICD-10-CM | POA: Insufficient documentation

## 2015-06-18 DIAGNOSIS — M1990 Unspecified osteoarthritis, unspecified site: Secondary | ICD-10-CM | POA: Insufficient documentation

## 2015-06-18 DIAGNOSIS — G8929 Other chronic pain: Secondary | ICD-10-CM | POA: Insufficient documentation

## 2015-06-18 DIAGNOSIS — K219 Gastro-esophageal reflux disease without esophagitis: Secondary | ICD-10-CM | POA: Insufficient documentation

## 2015-06-18 DIAGNOSIS — Z8673 Personal history of transient ischemic attack (TIA), and cerebral infarction without residual deficits: Secondary | ICD-10-CM | POA: Diagnosis not present

## 2015-06-18 DIAGNOSIS — Z9861 Coronary angioplasty status: Secondary | ICD-10-CM | POA: Insufficient documentation

## 2015-06-18 DIAGNOSIS — E039 Hypothyroidism, unspecified: Secondary | ICD-10-CM | POA: Diagnosis not present

## 2015-06-18 DIAGNOSIS — Z79899 Other long term (current) drug therapy: Secondary | ICD-10-CM | POA: Diagnosis not present

## 2015-06-18 DIAGNOSIS — I10 Essential (primary) hypertension: Secondary | ICD-10-CM | POA: Insufficient documentation

## 2015-06-18 DIAGNOSIS — E119 Type 2 diabetes mellitus without complications: Secondary | ICD-10-CM | POA: Diagnosis not present

## 2015-06-18 DIAGNOSIS — I480 Paroxysmal atrial fibrillation: Secondary | ICD-10-CM | POA: Diagnosis not present

## 2015-06-18 DIAGNOSIS — Z86711 Personal history of pulmonary embolism: Secondary | ICD-10-CM | POA: Diagnosis not present

## 2015-06-18 DIAGNOSIS — M25512 Pain in left shoulder: Secondary | ICD-10-CM | POA: Diagnosis not present

## 2015-06-18 DIAGNOSIS — Z9889 Other specified postprocedural states: Secondary | ICD-10-CM | POA: Diagnosis not present

## 2015-06-18 DIAGNOSIS — R600 Localized edema: Secondary | ICD-10-CM | POA: Diagnosis not present

## 2015-06-18 DIAGNOSIS — F329 Major depressive disorder, single episode, unspecified: Secondary | ICD-10-CM | POA: Diagnosis not present

## 2015-06-18 DIAGNOSIS — I25119 Atherosclerotic heart disease of native coronary artery with unspecified angina pectoris: Secondary | ICD-10-CM | POA: Insufficient documentation

## 2015-06-18 DIAGNOSIS — Z87891 Personal history of nicotine dependence: Secondary | ICD-10-CM | POA: Insufficient documentation

## 2015-06-18 DIAGNOSIS — J45909 Unspecified asthma, uncomplicated: Secondary | ICD-10-CM | POA: Insufficient documentation

## 2015-06-18 LAB — BRAIN NATRIURETIC PEPTIDE: B NATRIURETIC PEPTIDE 5: 372.2 pg/mL — AB (ref 0.0–100.0)

## 2015-06-18 LAB — URINALYSIS, ROUTINE W REFLEX MICROSCOPIC
BILIRUBIN URINE: NEGATIVE
Glucose, UA: NEGATIVE mg/dL
HGB URINE DIPSTICK: NEGATIVE
Ketones, ur: NEGATIVE mg/dL
Leukocytes, UA: NEGATIVE
Nitrite: NEGATIVE
PROTEIN: NEGATIVE mg/dL
Specific Gravity, Urine: 1.014 (ref 1.005–1.030)
pH: 7.5 (ref 5.0–8.0)

## 2015-06-18 LAB — COMPREHENSIVE METABOLIC PANEL
ALT: 15 U/L — ABNORMAL LOW (ref 17–63)
AST: 18 U/L (ref 15–41)
Albumin: 3.2 g/dL — ABNORMAL LOW (ref 3.5–5.0)
Alkaline Phosphatase: 63 U/L (ref 38–126)
Anion gap: 5 (ref 5–15)
BUN: 22 mg/dL — ABNORMAL HIGH (ref 6–20)
CO2: 28 mmol/L (ref 22–32)
CREATININE: 1.76 mg/dL — AB (ref 0.61–1.24)
Calcium: 9 mg/dL (ref 8.9–10.3)
Chloride: 109 mmol/L (ref 101–111)
GFR calc Af Amer: 39 mL/min — ABNORMAL LOW (ref >60)
GFR calc non Af Amer: 34 mL/min — ABNORMAL LOW (ref >60)
Glucose, Bld: 98 mg/dL (ref 65–99)
POTASSIUM: 3.9 mmol/L (ref 3.5–5.1)
Sodium: 142 mmol/L (ref 135–145)
TOTAL PROTEIN: 6.6 g/dL (ref 6.5–8.1)
Total Bilirubin: 0.7 mg/dL (ref 0.3–1.2)

## 2015-06-18 LAB — CBC WITH DIFFERENTIAL/PLATELET
BASOS ABS: 0 10*3/uL (ref 0.0–0.1)
Basophils Relative: 0 %
EOS PCT: 1 %
Eosinophils Absolute: 0.1 10*3/uL (ref 0.0–0.7)
HCT: 36.9 % — ABNORMAL LOW (ref 39.0–52.0)
Hemoglobin: 12.1 g/dL — ABNORMAL LOW (ref 13.0–17.0)
Lymphocytes Relative: 40 %
Lymphs Abs: 2.6 10*3/uL (ref 0.7–4.0)
MCH: 26.9 pg (ref 26.0–34.0)
MCHC: 32.8 g/dL (ref 30.0–36.0)
MCV: 82.2 fL (ref 78.0–100.0)
Monocytes Absolute: 0.6 10*3/uL (ref 0.1–1.0)
Monocytes Relative: 9 %
Neutro Abs: 3.2 10*3/uL (ref 1.7–7.7)
Neutrophils Relative %: 50 %
Platelets: 134 10*3/uL — ABNORMAL LOW (ref 150–400)
RBC: 4.49 MIL/uL (ref 4.22–5.81)
RDW: 16.8 % — ABNORMAL HIGH (ref 11.5–15.5)
WBC: 6.5 10*3/uL (ref 4.0–10.5)

## 2015-06-18 LAB — PROTIME-INR
INR: 2.09 — ABNORMAL HIGH (ref 0.00–1.49)
Prothrombin Time: 23.3 seconds — ABNORMAL HIGH (ref 11.6–15.2)

## 2015-06-18 LAB — I-STAT TROPONIN, ED: Troponin i, poc: 0.03 ng/mL (ref 0.00–0.08)

## 2015-06-18 MED ORDER — FUROSEMIDE 10 MG/ML IJ SOLN
40.0000 mg | INTRAMUSCULAR | Status: AC
Start: 2015-06-18 — End: 2015-06-18
  Administered 2015-06-18: 40 mg via INTRAVENOUS
  Filled 2015-06-18: qty 4

## 2015-06-18 NOTE — ED Provider Notes (Signed)
CSN: [id number]     Arrival date & time 06/18/15  1858 History   First MD Initiated Contact with Patient 06/18/15 1859     Chief Complaint  Patient presents with  . Leg Swelling     (Consider location/radiation/quality/duration/timing/severity/associated sxs/prior Treatment) HPI Comments: 82yo M w/ extensive PMH including T2DM, a fib on coumadin, CVA, CAD s/p MI who p/w leg swelling. Patient states that he has had bilateral leg swelling and tightness feeling that he initially stated began yesterday and worsened throughout today despite taking home Lasix and urinating normally. On further questioning, the patient states that his leg swelling has been or problematic for approximately one month. He states he has been compliant with his medications. He denies any shortness of breath or chest pain. He does note L shoulder pain radiating down his right arm that he has had "for a long time." He states that one of his doctors told him he had arthritis. His pain is worse w/ shoulder movement. He denies any fevers or recent illness.  The history is provided by the patient.    Past Medical History  Diagnosis Date  . GERD (gastroesophageal reflux disease)   . Chronic back pain   . Gout     no problems in 2 years  . Pulmonary embolism, bilateral (HCC) 2010  . Hyperlipemia   . Anginal pain (HCC)   . DVT, lower extremity (HCC) 1980's    LLE  . Asthma     "touch"  . Pneumonia 1970's  . Arthritis     "mostly in my knees and shoulders"  . Collapsed lung     "for 14 years" right   . Diabetes mellitus   . Hypertension   . Depression   . Anxiety   . Hearing loss   . Glaucoma   . Complication of anesthesia   . PONV (postoperative nausea and vomiting)   . Renal disorder ~ 03/2011    "wasn't functioning right"  . Stroke [facility])     "just a [place] one"  . Hypothyroidism   . Chronic abdominal pain   . Paroxysmal atrial fibrillation (HCC)   . Myocardial infarction (HCC) 1987; 1989  . Coronary  artery disease   . Esophageal stricture   . Hiatal hernia   . Hemorrhoids   . Schatzki's ring    Past Surgical History  Procedure Laterality Date  . Esophagogastroduodenoscopy  12/22/2010    Procedure: ESOPHAGOGASTRODUODENOSCOPY (EGD);  Surgeon: Theda Belfast;  Location: [facility] ENDOSCOPY;  Service: Endoscopy;  Laterality: N/A;  . Appendectomy  1950  . Cataract extraction w/ intraocular lens  implant, bilateral Bilateral 1990's  . Coronary angioplasty with stent placement      "think I have 4 total"  . Inguinal hernia repair Bilateral 03/28/2012    Procedure: LAPAROSCOPIC BILATERAL INGUINAL HERNIA REPAIR;  Surgeon: Ardeth Sportsman, MD;  Location: [facility] ORS;  Service: General;  Laterality: Bilateral;  . Insertion of mesh Bilateral 03/28/2012    Procedure: INSERTION OF MESH;  Surgeon: Ardeth Sportsman, MD;  Location: [facility] ORS;  Service: General;  Laterality: Bilateral;  . Hernia repair    . Esophagogastroduodenoscopy N/A 04/30/2012    Procedure: ESOPHAGOGASTRODUODENOSCOPY (EGD);  Surgeon: Theda Belfast, MD;  Location: [facility] ENDOSCOPY;  Service: Endoscopy;  Laterality: N/A;  . Colonoscopy N/A 05/01/2012    Procedure: COLONOSCOPY;  Surgeon: Theda Belfast, MD;  Location: [facility] ENDOSCOPY;  Service: Endoscopy;  Laterality: N/A;  . Cardiac catheterization    . Left heart catheterization  with coronary angiogram N/A 02/19/2011    Procedure: LEFT HEART CATHETERIZATION WITH CORONARY ANGIOGRAM;  Surgeon: Robynn Pane, MD;  Location: [facility] CATH LAB;  Service: Cardiovascular;  Laterality: N/A;   Family History  Problem Relation Age of Onset  . Diabetes Mother   . Heart disease Father   . Lung cancer Brother   . Stroke Father   . Hypertension Neg Hx   . Hyperlipidemia Neg Hx   . Alcohol abuse Neg Hx   . Colon cancer Neg Hx    Social History  Substance Use Topics  . Smoking status: Former Smoker -- 2.00 packs/day for 3 years    Types: Cigarettes    Start date: 01/27/1946    Quit date: 01/22/1981  . Smokeless  tobacco: Never Used  . Alcohol Use: No     Comment: 07/12/2011; "last drink of alcohol was ~ 1983"    Review of Systems 10 Systems reviewed and are negative for acute change except as noted in the HPI.    Allergies  Review of patient's allergies indicates no known allergies.  Home Medications   Prior to Admission medications   Medication Sig Start Date End Date Taking? Authorizing Provider  amiodarone (PACERONE) 200 MG tablet Take 200 mg by mouth daily.    Yes Historical Provider, MD  carvedilol (COREG) 3.125 MG tablet Take 3.125 mg by mouth 2 (two) times daily with a meal. 8am, 6pm   Yes Historical Provider, MD  divalproex (DEPAKOTE) 125 MG DR tablet Take 125 mg by mouth 2 (two) times daily.   Yes Historical Provider, MD  dorzolamide-timolol (COSOPT) 22.3-6.8 MG/ML ophthalmic solution Place 1 drop into the right eye daily.    Yes Historical Provider, MD  furosemide (LASIX) 40 MG tablet Take 40 mg by mouth 2 (two) times daily.    Yes Historical Provider, MD  isosorbide mononitrate (IMDUR) 60 MG 24 hr tablet Take 60 mg by mouth daily.   Yes Historical Provider, MD  latanoprost (XALATAN) 0.005 % ophthalmic solution Place 1 drop into both eyes at bedtime.   Yes Historical Provider, MD  levothyroxine (SYNTHROID, LEVOTHROID) 75 MCG tablet Take 75 mcg by mouth daily before breakfast.   Yes Historical Provider, MD  nitroGLYCERIN (NITROSTAT) 0.4 MG SL tablet Place 1 tablet (0.4 mg total) under the tongue every 5 (five) minutes as needed for chest pain. 04/24/14  Yes Garlon Hatchet, PA-C  omeprazole (PRILOSEC) 40 MG capsule Take 40 mg by mouth daily after breakfast.    Yes Historical Provider, MD  ranitidine (ZANTAC) 300 MG tablet Take 300 mg by mouth at bedtime.  01/04/14  Yes Historical Provider, MD  tamsulosin (FLOMAX) 0.4 MG CAPS capsule Take 0.8 mg by mouth daily after supper.    Yes Historical Provider, MD  warfarin (COUMADIN) 4 MG tablet Take 2-4 mg by mouth daily at 6 PM. Take 1/2 tablet (2  mg) on Sundays, and take 1 tablet (4 mg) all other days of the week   Yes Historical Provider, MD  ondansetron (ZOFRAN ODT) 4 MG disintegrating tablet 4mg  ODT q4 hours prn nausea/vomit 03/27/14   Purvis Sheffield, MD   BP 153/77 mmHg  Pulse 60  Temp(Src) 98 F (36.7 C) (Oral)  Resp 15  SpO2 97% Physical Exam  Constitutional: He is oriented to person, place, and time. He appears well-developed and well-nourished. No distress.  HENT:  Head: Normocephalic and atraumatic.  Moist mucous membranes  Eyes: Conjunctivae are normal. Pupils are equal, round, and reactive to light.  Neck: Neck supple.  Cardiovascular: Normal rate, regular rhythm, normal heart sounds and intact distal pulses.   No murmur heard. Pulmonary/Chest: Effort normal and breath sounds normal.  Abdominal: Soft. Bowel sounds are normal. He exhibits no distension. There is no tenderness.  Musculoskeletal: He exhibits edema. He exhibits no tenderness.  3+ pitting edema to knees; 5/5 grip strength b/l  Neurological: He is alert and oriented to person, place, and time.  Fluent speech; normal sensation x all 4 ext  Skin: Skin is warm and dry. No erythema.  Psychiatric: He has a normal mood and affect. Judgment normal.  Nursing note and vitals reviewed.   ED Course  Procedures (including critical care time) Labs Review Labs Reviewed  COMPREHENSIVE METABOLIC PANEL - Abnormal; Notable for the following:    BUN 22 (*)    Creatinine, Ser 1.76 (*)    Albumin 3.2 (*)    ALT 15 (*)    GFR calc non Af Amer 34 (*)    GFR calc Af Amer 39 (*)    All other components within normal limits  BRAIN NATRIURETIC PEPTIDE - Abnormal; Notable for the following:    B Natriuretic Peptide 372.2 (*)    All other components within normal limits  CBC WITH DIFFERENTIAL/PLATELET - Abnormal; Notable for the following:    Hemoglobin 12.1 (*)    HCT 36.9 (*)    RDW 16.8 (*)    Platelets 134 (*)    All other components within normal limits   PROTIME-INR - Abnormal; Notable for the following:    Prothrombin Time 23.3 (*)    INR 2.09 (*)    All other components within normal limits  URINALYSIS, ROUTINE W REFLEX MICROSCOPIC (NOT AT [facility])  [facility] SensorI-STAT TROPOININ, ED    Imaging Review Dg Chest 2 View  06/18/2015  CLINICAL DATA:  Patient with lower extremity edema. EXAM: CHEST  2 VIEW COMPARISON:  Chest radiograph 08/14/2014 FINDINGS: Stable cardiac and mediastinal contours. No consolidative pulmonary opacities. No pleural effusion or pneumothorax. Thoracic spine degenerative changes. Aortic vascular calcifications. IMPRESSION: No active cardiopulmonary disease. Electronically Signed   By: Annia Beltrew  Davis M.D.   On: 06/18/2015 20:24   I have personally reviewed and evaluated these lab results as part of my medical decision-making.   EKG Interpretation   Date/Time:  Saturday Jun 18 2015 19:10:58 EDT Ventricular Rate:  56 PR Interval:  147 QRS Duration: 154 QT Interval:  476 QTC Calculation: 459 R Axis:   18 Text Interpretation:  Sinus rhythm Probable left atrial enlargement Left  bundle branch block No significant change since last tracing Confirmed by  Paulino Cork MD, Savva Beamer [phone]) on 06/18/2015 7:13:31 PM     Medications  furosemide (LASIX) injection 40 mg (40 mg Intravenous Given 06/18/15 2200)    MDM   Final diagnoses:  Bilateral edema of lower extremity  Left shoulder pain   Patient presents with bilateral leg swelling that has not improved with Lasix, he initially stated that it began yesterday but later stated that it has been a problem for months. He was well-appearing on exam with vital signs notable for BP 164/74, O2 sat 100% on room air. No respiratory complaints and normal breath sounds on exam. 3+ bilateral pitting edema lower extremities noted. Obtained above lab work including BNP, LFTs, creatinine, and INR.  Labwork shows normal LFTs, stable creatinine at 1.76, BNP 372, INR therapeutic at 2.09, chest x-ray negative  for acute process. EKG unchanged from previous. Given his reassuring lab work, I do  not feel his leg swelling is due to heart failure exacerbation, liver disease, or new kidney disease. Gave him a dose of Lasix in the ED and discussed continuing his home Lasix starting tomorrow morning. Instructed to contact PCP regarding further instructions for dosing of Lasix. He has reassuring vital signs here with no respiratory complaints and I feel he is safe for discharge. He has had no chest pain and regarding his shoulder pain, he states that it is chronic and is worse with movement suggesting a musculoskeletal etiology. Return precautions reviewed and patient charged in satisfactory condition.  Laurence Spates, MD 06/19/15 [phone]

## 2015-06-18 NOTE — ED Notes (Signed)
Per ems, pt here for leg swelling that began yesterday and has progressed. Pt has been taking his lasix and urinating but swelling is not going down. Airway intact. Denies sob, lung sounds clear. Alert and oriented x 4. BP 170/80, HR 70 NSR, RR 20.

## 2015-06-18 NOTE — ED Notes (Signed)
Pt now states that since his legs have been swelling that he has had left shoulder pain going down his left arm.

## 2015-06-18 NOTE — ED Notes (Signed)
Pt verbalized understanding of d/c instructions and has no further questions. Pt stable and NAD. Pt given cab voucher.

## 2015-06-18 NOTE — Discharge Instructions (Signed)
Edema °Edema is an abnormal buildup of fluids in your body tissues. Edema is somewhat dependent on gravity to pull the fluid to the lowest place in your body. That makes the condition more common in the legs and thighs (lower extremities). Painless swelling of the feet and ankles is common and becomes more likely as you get older. It is also common in looser tissues, like around your eyes.  °When the affected area is squeezed, the fluid may move out of that spot and leave a dent for a few moments. This dent is called pitting.  °CAUSES  °There are many possible causes of edema. Eating too much salt and being on your feet or sitting for a long time can cause edema in your legs and ankles. Hot weather may make edema worse. Common medical causes of edema include: °· Heart failure. °· Liver disease. °· Kidney disease. °· Weak blood vessels in your legs. °· Cancer. °· An injury. °· Pregnancy. °· Some medications. °· Obesity.  °SYMPTOMS  °Edema is usually painless. Your skin may look swollen or shiny.  °DIAGNOSIS  °Your health care provider may be able to diagnose edema by asking about your medical history and doing a physical exam. You may need to have tests such as X-rays, an electrocardiogram, or blood tests to check for medical conditions that may cause edema.  °TREATMENT  °Edema treatment depends on the cause. If you have heart, liver, or kidney disease, you need the treatment appropriate for these conditions. General treatment may include: °· Elevation of the affected body part above the level of your heart. °· Compression of the affected body part. Pressure from elastic bandages or support stockings squeezes the tissues and forces fluid back into the blood vessels. This keeps fluid from entering the tissues. °· Restriction of fluid and salt intake. °· Use of a water pill (diuretic). These medications are appropriate only for some types of edema. They pull fluid out of your body and make you urinate more often. This  gets rid of fluid and reduces swelling, but diuretics can have side effects. Only use diuretics as directed by your health care provider. °HOME CARE INSTRUCTIONS  °· Keep the affected body part above the level of your heart when you are lying down.   °· Do not sit still or stand for prolonged periods.   °· Do not put anything directly under your knees when lying down. °· Do not wear constricting clothing or garters on your upper legs.   °· Exercise your legs to work the fluid back into your blood vessels. This may help the swelling go down.   °· Wear elastic bandages or support stockings to reduce ankle swelling as directed by your health care provider.   °· Eat a low-salt diet to reduce fluid if your health care provider recommends it.   °· Only take medicines as directed by your health care provider.  °SEEK MEDICAL CARE IF:  °· Your edema is not responding to treatment. °· You have heart, liver, or kidney disease and notice symptoms of edema. °· You have edema in your legs that does not improve after elevating them.   °· You have sudden and unexplained weight gain. °SEEK IMMEDIATE MEDICAL CARE IF:  °· You develop shortness of breath or chest pain.   °· You cannot breathe when you lie down. °· You develop pain, redness, or warmth in the swollen areas.   °· You have heart, liver, or kidney disease and suddenly get edema. °· You have a fever and your symptoms suddenly get worse. °MAKE SURE YOU:  °·   Understand these instructions. °· Will watch your condition. °· Will get help right away if you are not doing well or get worse. °  °This information is not intended to replace advice given to you by your health care provider. Make sure you discuss any questions you have with your health care provider. °  °Document Released: 01/08/2005 Document Revised: 01/29/2014 Document Reviewed: 10/31/2012 °Elsevier Interactive Patient Education ©2016 [employer] ° °

## 2015-07-17 ENCOUNTER — Emergency Department (HOSPITAL_COMMUNITY): Payer: Medicare HMO

## 2015-07-17 ENCOUNTER — Emergency Department (HOSPITAL_COMMUNITY)
Admission: EM | Admit: 2015-07-17 | Discharge: 2015-07-17 | Disposition: A | Discharge status: Home / Self Care | Payer: Medicare HMO | Attending: Emergency Medicine | Admitting: Emergency Medicine

## 2015-07-17 ENCOUNTER — Encounter (HOSPITAL_COMMUNITY): Payer: Self-pay

## 2015-07-17 ENCOUNTER — Other Ambulatory Visit: Payer: Self-pay

## 2015-07-17 DIAGNOSIS — M79661 Pain in right lower leg: Secondary | ICD-10-CM | POA: Insufficient documentation

## 2015-07-17 DIAGNOSIS — I872 Venous insufficiency (chronic) (peripheral): Secondary | ICD-10-CM | POA: Diagnosis not present

## 2015-07-17 DIAGNOSIS — I447 Left bundle-branch block, unspecified: Secondary | ICD-10-CM

## 2015-07-17 DIAGNOSIS — I2510 Atherosclerotic heart disease of native coronary artery without angina pectoris: Secondary | ICD-10-CM | POA: Insufficient documentation

## 2015-07-17 DIAGNOSIS — G8929 Other chronic pain: Secondary | ICD-10-CM

## 2015-07-17 DIAGNOSIS — D696 Thrombocytopenia, unspecified: Secondary | ICD-10-CM | POA: Diagnosis not present

## 2015-07-17 DIAGNOSIS — J45909 Unspecified asthma, uncomplicated: Secondary | ICD-10-CM | POA: Diagnosis not present

## 2015-07-17 DIAGNOSIS — Z7901 Long term (current) use of anticoagulants: Secondary | ICD-10-CM

## 2015-07-17 DIAGNOSIS — Z8673 Personal history of transient ischemic attack (TIA), and cerebral infarction without residual deficits: Secondary | ICD-10-CM | POA: Diagnosis not present

## 2015-07-17 DIAGNOSIS — R609 Edema, unspecified: Secondary | ICD-10-CM

## 2015-07-17 DIAGNOSIS — R600 Localized edema: Secondary | ICD-10-CM

## 2015-07-17 DIAGNOSIS — I129 Hypertensive chronic kidney disease with stage 1 through stage 4 chronic kidney disease, or unspecified chronic kidney disease: Secondary | ICD-10-CM | POA: Insufficient documentation

## 2015-07-17 DIAGNOSIS — Z87891 Personal history of nicotine dependence: Secondary | ICD-10-CM | POA: Insufficient documentation

## 2015-07-17 DIAGNOSIS — M79662 Pain in left lower leg: Secondary | ICD-10-CM | POA: Insufficient documentation

## 2015-07-17 DIAGNOSIS — E119 Type 2 diabetes mellitus without complications: Secondary | ICD-10-CM | POA: Insufficient documentation

## 2015-07-17 DIAGNOSIS — N189 Chronic kidney disease, unspecified: Secondary | ICD-10-CM

## 2015-07-17 DIAGNOSIS — M79606 Pain in leg, unspecified: Secondary | ICD-10-CM

## 2015-07-17 DIAGNOSIS — D649 Anemia, unspecified: Secondary | ICD-10-CM

## 2015-07-17 DIAGNOSIS — I252 Old myocardial infarction: Secondary | ICD-10-CM | POA: Insufficient documentation

## 2015-07-17 DIAGNOSIS — Z79899 Other long term (current) drug therapy: Secondary | ICD-10-CM | POA: Insufficient documentation

## 2015-07-17 LAB — COMPREHENSIVE METABOLIC PANEL
ALK PHOS: 69 U/L (ref 38–126)
ALT: 12 U/L — ABNORMAL LOW (ref 17–63)
AST: 21 U/L (ref 15–41)
Albumin: 3.3 g/dL — ABNORMAL LOW (ref 3.5–5.0)
Anion gap: 7 (ref 5–15)
BUN: 26 mg/dL — ABNORMAL HIGH (ref 6–20)
CALCIUM: 9.1 mg/dL (ref 8.9–10.3)
CO2: 32 mmol/L (ref 22–32)
Chloride: 103 mmol/L (ref 101–111)
Creatinine, Ser: 1.91 mg/dL — ABNORMAL HIGH (ref 0.61–1.24)
GFR, EST AFRICAN AMERICAN: 36 mL/min — AB (ref >60)
GFR, EST NON AFRICAN AMERICAN: 31 mL/min — AB (ref >60)
Glucose, Bld: 97 mg/dL (ref 65–99)
Potassium: 3.7 mmol/L (ref 3.5–5.1)
SODIUM: 142 mmol/L (ref 135–145)
Total Bilirubin: 0.4 mg/dL (ref 0.3–1.2)
Total Protein: 6.7 g/dL (ref 6.5–8.1)

## 2015-07-17 LAB — CBC WITH DIFFERENTIAL/PLATELET
BASOS ABS: 0 10*3/uL (ref 0.0–0.1)
Basophils Relative: 0 %
Eosinophils Absolute: 0.1 10*3/uL (ref 0.0–0.7)
Eosinophils Relative: 2 %
HCT: 36.2 % — ABNORMAL LOW (ref 39.0–52.0)
Hemoglobin: 12.4 g/dL — ABNORMAL LOW (ref 13.0–17.0)
Lymphocytes Relative: 35 %
Lymphs Abs: 2.1 10*3/uL (ref 0.7–4.0)
MCH: 28.1 pg (ref 26.0–34.0)
MCHC: 34.3 g/dL (ref 30.0–36.0)
MCV: 82.1 fL (ref 78.0–100.0)
MONO ABS: 0.5 10*3/uL (ref 0.1–1.0)
Monocytes Relative: 8 %
NEUTROS ABS: 3.3 10*3/uL (ref 1.7–7.7)
Neutrophils Relative %: 55 %
Platelets: 147 10*3/uL — ABNORMAL LOW (ref 150–400)
RBC: 4.41 MIL/uL (ref 4.22–5.81)
RDW: 15.9 % — ABNORMAL HIGH (ref 11.5–15.5)
WBC: 6 10*3/uL (ref 4.0–10.5)

## 2015-07-17 LAB — BRAIN NATRIURETIC PEPTIDE: B Natriuretic Peptide: 180.9 pg/mL — ABNORMAL HIGH (ref 0.0–100.0)

## 2015-07-17 LAB — I-STAT TROPONIN, ED: TROPONIN I, POC: 0.03 ng/mL (ref 0.00–0.08)

## 2015-07-17 LAB — PROTIME-INR
INR: 2.94 — AB (ref 0.00–1.49)
Prothrombin Time: 30.2 seconds — ABNORMAL HIGH (ref 11.6–15.2)

## 2015-07-17 MED ORDER — FUROSEMIDE 10 MG/ML IJ SOLN
40.0000 mg | Freq: Once | INTRAMUSCULAR | Status: AC
  Administered 2015-07-17: 40 mg via INTRAVENOUS
  Filled 2015-07-17: qty 4

## 2015-07-17 NOTE — ED Notes (Signed)
Patient comes from home with bilateral leg swelling.  Does have chronic leg swelling and pain. Patient alert and oriented.

## 2015-07-17 NOTE — ED Provider Notes (Signed)
CSN: [id number]     Arrival date & time 07/17/15  1711 History   First MD Initiated Contact with Patient 07/17/15 1723     Chief Complaint  Patient presents with  . Leg Swelling     (Consider location/radiation/quality/duration/timing/severity/associated sxs/prior Treatment) HPI Comments: [name] is a 82 y.o. male with a PMHx of GERD, chronic back pain, PE on coumadin, DM2, HTN, HLD, asthma, chronic peripheral edema on Lasix, diastolic dysfunction CHF, paroxysmal Afib, CAD/MI, hypothryroidism, as well as other medical conditions described below, who presents to the ED with complaints of gradually worsening bilateral leg swelling and pain. He reports that his leg swelling and pain is chronic, but over the last 1 week it seemed to of gradually worsened. He is taking his Lasix twice a day as prescribed but can't recall the dose of this medication (states "it's whatever is listed in my chart"), but admits that he isn't using his compression stockings. He reports that the swelling improves when he elevates his legs, but as the swelling has worsened over the last week it has become harder for him to ambulate easily at home so he came to the ER. He reports that the pain in his b/l lower legs is 10/10 intermittent nonradiating bilateral lower extremity tightness-type pain, worse with walking, and improved with elevation and Tylenol. He states this is the same as his chronic pain. He reports he is compliant with his Coumadin, and denies any recent travel/surgery/immobilization.  He also denies any fevers, chills, diaphoresis, lightheadedness, chest pain, shortness breath, wheezing, cough, orthopnea, abdominal pain, nausea, vomiting, diarrhea constipation, obstipation, dysuria, hematuria, numbness, tingling, focal weakness, or erythema or warmth of the legs. No drainage from his legs.  Chart review reveals he was seen on 06/18/15 for similar symptoms, had work up which was relatively unremarkable, was given  IV Lasix 40mg  and discharged home to f/up with PCP. Was also seen in 12/2014 and had a neg work up, told to use compression stockings and d/c home for PCP f/up.   Patient is a 82 y.o. male presenting with leg pain. The history is provided by the patient and medical records. No language interpreter was used.  Leg Pain Location:  Leg Time since incident:  1 week Injury: no   Leg location:  L lower leg and R lower leg Pain details:    Quality: tightness.   Radiates to:  Does not radiate   Severity:  Severe   Onset quality:  Gradual   Duration:  1 week   Timing:  Intermittent   Progression:  Unchanged Chronicity:  Chronic Relieved by:  Elevation and acetaminophen Worsened by:  Activity Ineffective treatments:  None tried Associated symptoms: swelling   Associated symptoms: no decreased ROM, no fever, no muscle weakness, no numbness and no tingling     Past Medical History  Diagnosis Date  . GERD (gastroesophageal reflux disease)   . Chronic back pain   . Gout     no problems in 2 years  . Pulmonary embolism, bilateral (HCC) 2010  . Hyperlipemia   . Anginal pain (HCC)   . DVT, lower extremity (HCC) 1980's    LLE  . Asthma     "touch"  . Pneumonia 1970's  . Arthritis     "mostly in my knees and shoulders"  . Collapsed lung     "for 14 years" right   . Diabetes mellitus   . Hypertension   . Depression   . Anxiety   . Hearing  loss   . Glaucoma   . Complication of anesthesia   . PONV (postoperative nausea and vomiting)   . Renal disorder ~ 03/2011    "wasn't functioning right"  . Stroke [facility])     "just a little one"  . Hypothyroidism   . Chronic abdominal pain   . Paroxysmal atrial fibrillation (HCC)   . Myocardial infarction (HCC) 1987; 1989  . Coronary artery disease   . Esophageal stricture   . Hiatal hernia   . Hemorrhoids   . Schatzki's ring    Past Surgical History  Procedure Laterality Date  . Esophagogastroduodenoscopy  12/22/2010    Procedure:  ESOPHAGOGASTRODUODENOSCOPY (EGD);  Surgeon: Theda Belfast;  Location: [facility] ENDOSCOPY;  Service: Endoscopy;  Laterality: N/A;  . Appendectomy  1950  . Cataract extraction w/ intraocular lens  implant, bilateral Bilateral 1990's  . Coronary angioplasty with stent placement      "think I have 4 total"  . Inguinal hernia repair Bilateral 03/28/2012    Procedure: LAPAROSCOPIC BILATERAL INGUINAL HERNIA REPAIR;  Surgeon: Ardeth Sportsman, MD;  Location: [facility] ORS;  Service: General;  Laterality: Bilateral;  . Insertion of mesh Bilateral 03/28/2012    Procedure: INSERTION OF MESH;  Surgeon: Ardeth Sportsman, MD;  Location: [facility] ORS;  Service: General;  Laterality: Bilateral;  . Hernia repair    . Esophagogastroduodenoscopy N/A 04/30/2012    Procedure: ESOPHAGOGASTRODUODENOSCOPY (EGD);  Surgeon: Theda Belfast, MD;  Location: [facility] ENDOSCOPY;  Service: Endoscopy;  Laterality: N/A;  . Colonoscopy N/A 05/01/2012    Procedure: COLONOSCOPY;  Surgeon: Theda Belfast, MD;  Location: [facility] ENDOSCOPY;  Service: Endoscopy;  Laterality: N/A;  . Cardiac catheterization    . Left heart catheterization with coronary angiogram N/A 02/19/2011    Procedure: LEFT HEART CATHETERIZATION WITH CORONARY ANGIOGRAM;  Surgeon: Robynn Pane, MD;  Location: [facility] C[facility] LAB;  Service: Cardiovascular;  Laterality: N/A;   Family History  Problem Relation Age of Onset  . Diabetes Mother   . Heart disease Father   . Lung cancer Brother   . Stroke Father   . Hypertension Neg Hx   . Hyperlipidemia Neg Hx   . Alcohol abuse Neg Hx   . Colon cancer Neg Hx    Social History  Substance Use Topics  . Smoking status: Former Smoker -- 2.00 packs/day for 3 years    Types: Cigarettes    Start date: 01/27/1946    Quit date: 01/22/1981  . Smokeless tobacco: Never Used  . Alcohol Use: No     Comment: 07/12/2011; "last drink of alcohol was ~ 1983"    Review of Systems  Constitutional: Negative for fever, chills and diaphoresis.  Respiratory: Negative for  cough, shortness of breath and wheezing.   Cardiovascular: Positive for leg swelling (bilateral, chronic but gradually worsening). Negative for chest pain.  Gastrointestinal: Negative for nausea, vomiting, abdominal pain, diarrhea and constipation.  Genitourinary: Negative for dysuria and hematuria.  Musculoskeletal: Positive for myalgias (b/l legs). Negative for arthralgias.  Skin: Negative for color change and wound.  Allergic/Immunologic: Positive for immunocompromised state (diabetic).  Neurological: Negative for weakness, light-headedness and numbness.  Psychiatric/Behavioral: Negative for confusion.   10 Systems reviewed and are negative for acute change except as noted in the HPI.    Allergies  Review of patient's allergies indicates no known allergies.  Home Medications   Prior to Admission medications   Medication Sig Start Date End Date Taking? Authorizing Provider  amiodarone (PACERONE) 200 MG tablet Take  200 mg by mouth daily.     Historical Provider, MD  carvedilol (COREG) 3.125 MG tablet Take 3.125 mg by mouth 2 (two) times daily with a meal. 8am, 6pm    Historical Provider, MD  divalproex (DEPAKOTE) 125 MG DR tablet Take 125 mg by mouth 2 (two) times daily.    Historical Provider, MD  dorzolamide-timolol (COSOPT) 22.3-6.8 MG/ML ophthalmic solution Place 1 drop into the right eye daily.     Historical Provider, MD  furosemide (LASIX) 40 MG tablet Take 40 mg by mouth 2 (two) times daily.     Historical Provider, MD  isosorbide mononitrate (IMDUR) 60 MG 24 hr tablet Take 60 mg by mouth daily.    Historical Provider, MD  latanoprost (XALATAN) 0.005 % ophthalmic solution Place 1 drop into both eyes at bedtime.    Historical Provider, MD  levothyroxine (SYNTHROID, LEVOTHROID) 75 MCG tablet Take 75 mcg by mouth daily before breakfast.    Historical Provider, MD  nitroGLYCERIN (NITROSTAT) 0.4 MG SL tablet Place 1 tablet (0.4 mg total) under the tongue every 5 (five) minutes as  needed for chest pain. 04/24/14   Garlon HatchetLisa M Sanders, PA-C  omeprazole (PRILOSEC) 40 MG capsule Take 40 mg by mouth daily after breakfast.     Historical Provider, MD  ondansetron (ZOFRAN ODT) 4 MG disintegrating tablet 4mg  ODT q4 hours prn nausea/vomit 03/27/14   Purvis SheffieldForrest Harrison, MD  ranitidine (ZANTAC) 300 MG tablet Take 300 mg by mouth at bedtime.  01/04/14   Historical Provider, MD  tamsulosin (FLOMAX) 0.4 MG CAPS capsule Take 0.8 mg by mouth daily after supper.     Historical Provider, MD  warfarin (COUMADIN) 4 MG tablet Take 2-4 mg by mouth daily at 6 PM. Take 1/2 tablet (2 mg) on Sundays, and take 1 tablet (4 mg) all other days of the week    Historical Provider, MD   BP 146/76 mmHg  Pulse 70  Temp(Src) 98.3 F (36.8 C) (Oral)  Resp 18  SpO2 98% Physical Exam  Constitutional: He is oriented to person, place, and time. Vital signs are normal. He appears well-developed and well-nourished.  Non-toxic appearance. No distress.  Afebrile, nontoxic, NAD  HENT:  Head: Normocephalic and atraumatic.  Mouth/Throat: Oropharynx is clear and moist and mucous membranes are normal.  Eyes: Conjunctivae and EOM are normal. Right eye exhibits no discharge. Left eye exhibits no discharge.  Neck: Normal range of motion. Neck supple.  Cardiovascular: Normal rate, regular rhythm, normal heart sounds and intact distal pulses.  Exam reveals no gallop and no friction rub.   No murmur heard. RRR, nl s1/s2, no m/r/g, distal pulses intact, with b/l 2-3+ pedal edema up to the knee  Pulmonary/Chest: Effort normal and breath sounds normal. No respiratory distress. He has no decreased breath sounds. He has no wheezes. He has no rhonchi. He has no rales.  CTAB in all lung fields, no w/r/r, no hypoxia or increased WOB, speaking in full sentences, SpO2 98% on RA   Abdominal: Soft. Normal appearance and bowel sounds are normal. He exhibits no distension. There is no tenderness. There is no rigidity, no rebound, no guarding, no  CVA tenderness, no tenderness at McBurney's point and negative Murphy's sign.  Musculoskeletal: Normal range of motion.  Chronic venous stasis changes in both lower legs, 2-3+ b/l pedal edema extending up to the knees, without erythema/warmth/cellulitic changes, no weeping or drainage. Soft compartments, FROM intact in all joints, strength and sensation grossly intact, distal pulses intact. Toenails yellowed  and hypertrophic c/w venous insufficiency  Neurological: He is alert and oriented to person, place, and time. He has normal strength. No sensory deficit.  Skin: Skin is warm, dry and intact. No rash noted. No erythema.  Psychiatric: He has a normal mood and affect.  Nursing note and vitals reviewed.   ED Course  Procedures (including critical care time) Labs Review Labs Reviewed  CBC WITH DIFFERENTIAL/PLATELET - Abnormal; Notable for the following:    Hemoglobin 12.4 (*)    HCT 36.2 (*)    RDW 15.9 (*)    Platelets 147 (*)    All other components within normal limits  COMPREHENSIVE METABOLIC PANEL - Abnormal; Notable for the following:    BUN 26 (*)    Creatinine, Ser 1.91 (*)    Albumin 3.3 (*)    ALT 12 (*)    GFR calc non Af Amer 31 (*)    GFR calc Af Amer 36 (*)    All other components within normal limits  BRAIN NATRIURETIC PEPTIDE - Abnormal; Notable for the following:    B Natriuretic Peptide 180.9 (*)    All other components within normal limits  PROTIME-INR - Abnormal; Notable for the following:    Prothrombin Time 30.2 (*)    INR 2.94 (*)    All other components within normal limits  I-STAT TROPOININ, ED    Imaging Review Dg Chest 2 View  07/17/2015  CLINICAL DATA:  Bilateral leg swelling. Chronic leg swelling and pain. History of hypertension, MI, atrial fibrillation. Evaluate for acute CHF. EXAM: CHEST  2 VIEW COMPARISON:  Chest x-rays dated 06/18/2015 in 07/31/2014. FINDINGS: Mild cardiomegaly is stable. Overall cardiomediastinal silhouette is stable in size  and configuration. Atherosclerotic changes again noted at the aortic arch. Prominence of the right perihilar shadow appears stable suggesting chronic pulmonary artery hypertension. Lungs are clear. No pleural effusion or pneumothorax. Osseous structures about the chest are unremarkable. Stable scoliosis of the thoracic spine. IMPRESSION: 1. No acute findings. No evidence of acute CHF. No evidence of pneumonia. 2. Mild cardiomegaly is stable. 3. Probable chronic pulmonary artery hypertension. 4. Aortic atherosclerosis. Electronically Signed   By: Bary Richard M.D.   On: 07/17/2015 18:57     Echo 01/10/14: Study Conclusions - Left ventricle: There was asymmetric hypertrophy. Systolic function was normal. The estimated ejection fraction was in the range of 50% to 55%. Wall motion was normal; there were no regional wall motion abnormalities. Doppler parameters are consistent with abnormal left ventricular relaxation (grade 1 diastolic dysfunction). - Left atrium: The atrium was mildly dilated.  I have personally reviewed and evaluated these images and lab results as part of my medical decision-making.   EKG Interpretation   Date/Time:  Sunday July 17 2015 17:44:58 EDT Ventricular Rate:  65 PR Interval:    QRS Duration: 154 QT Interval:  484 QTC Calculation: 504 R Axis:   82 Text Interpretation:  Sinus rhythm Left bundle branch block Confirmed by  Juleen China  MD, STEPHEN ([id number]) on 07/17/2015 5:48:13 PM      MDM   Final diagnoses:  Peripheral edema  Chronic leg pain, unspecified laterality  Venous insufficiency (chronic) (peripheral)  Anticoagulated on warfarin  Chronic anemia  Thrombocytopenia (HCC)  CKD (chronic kidney disease), unspecified stage    82 y.o. male here with gradually worsening b/l LE swelling, acute on chronic; has been seen 2x in the last 6 months for similar symptoms. Reports they feel tight, but denies redness/warmth/weeping. On exam, clear lungs, NVI with  soft compartments in both legs, chronic venous stasis changes without evidence of dermatitis/cellulitis, doubt DVT given that he is on chronic anticoagulation and this is bilateral; could be due to CHF vs just venous insufficiency. I question how compliant he is with his outpatient regimen, since he can't really recall how much lasix he takes. No SOB or CP, but will check labs, EKG, trop, BNP, and CXR to ensure no CHF findings on CXR or abnormalities on labs to explain symptoms. Will give IV 40mg  lasix now and reassess shortly.   7:45 PM  Trop neg. EKG with LBBB similar to prior EKGs without acute changes. CXR with stable mild cardiomegaly without acute findings for acute CHF or other acute concerning/emergent findings. CBC w/diff showing baseline anemia and thrombocytopenia unchanged from prior labs. CMP with baseline kidney function BUN 26 Cr 1.91 similar to prior values. INR 2.94 which is in the target for his anticoagulation (2-3 target range). BNP 180.9 which is less than last month (372.2 on 06/18/15). Overall, seems likely that his symptoms are related to chronic venous insufficiency. His niece at bedside now and states that his lasix was increased to 80mg  twice daily so will continue this regimen; restrict salt intake. F/up with his PCP closely and use the compression stockings as directed. Elevate legs to help with swelling and pain. Tylenol PRN pain. F/up PCP in 3-5 days for recheck and ongoing management. I explained the diagnosis and have given explicit precautions to return to the ER including for any other new or worsening symptoms. The patient understands and accepts the medical plan as it's been dictated and I have answered their questions. Discharge instructions concerning home care and prescriptions have been given. The patient is STABLE and is discharged to home in good condition.  BP 146/76 mmHg  Pulse 70  Temp(Src) 98.3 F (36.8 C) (Oral)  Resp 18  SpO2 98%  Meds ordered this  encounter  Medications  . furosemide (LASIX) injection 40 mg    Sig:      Donold Marotto Camprubi-Soms, PA-C 07/17/15 1952  Raeford Razor, MD 07/19/15 [id number]

## 2015-07-17 NOTE — Discharge Instructions (Signed)
Your leg swelling is likely from your CHF and venous insufficiency. Elevate your legs to help with swelling and pain. Take your home lasix as directed (80 mg twice daily which is 2 tablets twice daily). Use your compression stockings at home to help with leg pain and swelling. Take tylenol as needed for pain control. Continue your other home medications as directed by your regular doctor. AVOID SALTY FOODS OR FOODS WITH HIGH SODIUM CONTENT. Follow up with your regular doctor in 3-5 days for ongoing management and recheck of your chronic leg swelling/pain. Return to the ER for changes or worsening symptoms.   Peripheral Edema You have swelling in your legs (peripheral edema). This swelling is due to excess accumulation of salt and water in your body. Edema may be a sign of heart, kidney or liver disease, or a side effect of a medication. It may also be due to problems in the leg veins. Elevating your legs and using special support stockings may be very helpful, if the cause of the swelling is due to poor venous circulation. Avoid long periods of standing, whatever the cause. Treatment of edema depends on identifying the cause. Chips, pretzels, pickles and other salty foods should be avoided. Restricting salt in your diet is almost always needed. Water pills (diuretics) are often used to remove the excess salt and water from your body via urine. These medicines prevent the kidney from reabsorbing sodium. This increases urine flow. Diuretic treatment may also result in lowering of potassium levels in your body. Potassium supplements may be needed if you have to use diuretics daily. Daily weights can help you keep track of your progress in clearing your edema. You should call your caregiver for follow up care as recommended. SEEK IMMEDIATE MEDICAL CARE IF:   You have increased swelling, pain, redness, or heat in your legs.  You develop shortness of breath, especially when lying down.  You develop chest or  abdominal pain, weakness, or fainting.  You have a fever.   This information is not intended to replace advice given to you by your health care provider. Make sure you discuss any questions you have with your health care provider.   Document Released: 02/16/2004 Document Revised: 04/02/2011 Document Reviewed: 07/21/2014 Elsevier Interactive Patient Education 2016 Elsevier Inc.  Venous Stasis or Chronic Venous Insufficiency Chronic venous insufficiency, also called venous stasis, is a condition that affects the veins in the legs. The condition prevents blood from being pumped through these veins effectively. Blood may no longer be pumped effectively from the legs back to the heart. This condition can range from mild to severe. With proper treatment, you should be able to continue with an active life. CAUSES  Chronic venous insufficiency occurs when the vein walls become stretched, weakened, or damaged or when valves within the vein are damaged. Some common causes of this include:  High blood pressure inside the veins (venous hypertension).  Increased blood pressure in the leg veins from long periods of sitting or standing.  A blood clot that blocks blood flow in a vein (deep vein thrombosis).  Inflammation of a superficial vein (phlebitis) that causes a blood clot to form. RISK FACTORS Various things can make you more likely to develop chronic venous insufficiency, including:  Family history of this condition.  Obesity.  Pregnancy.  Sedentary lifestyle.  Smoking.  Jobs requiring long periods of standing or sitting in one place.  Being a certain age. Women in their 1440s and 6950s and men in their 2070s  are more likely to develop this condition. SIGNS AND SYMPTOMS  Symptoms may include:   Varicose veins.  Skin breakdown or ulcers.  Reddened or discolored skin on the leg.  Brown, smooth, tight, and painful skin just above the ankle, usually on the inside surface  (lipodermatosclerosis).  Swelling. DIAGNOSIS  To diagnose this condition, your health care provider will take a medical history and do a physical exam. The following tests may be ordered to confirm the diagnosis:  Duplex ultrasound--A procedure that produces a picture of a blood vessel and nearby organs and also provides information on blood flow through the blood vessel.  Plethysmography--A procedure that tests blood flow.  A venogram, or venography--A procedure used to look at the veins using X-ray and dye. TREATMENT The goals of treatment are to help you return to an active life and to minimize pain or disability. Treatment will depend on the severity of the condition. Medical procedures may be needed for severe cases. Treatment options may include:   Use of compression stockings. These can help with symptoms and lower the chances of the problem getting worse, but they do not cure the problem.  Sclerotherapy--A procedure involving an injection of a material that "dissolves" the damaged veins. Other veins in the network of blood vessels take over the function of the damaged veins.  Surgery to remove the vein or cut off blood flow through the vein (vein stripping or laser ablation surgery).  Surgery to repair a valve. HOME CARE INSTRUCTIONS   Wear compression stockings as directed by your health care provider.  Only take over-the-counter or prescription medicines for pain, discomfort, or fever as directed by your health care provider.  Follow up with your health care provider as directed. SEEK MEDICAL CARE IF:   You have redness, swelling, or increasing pain in the affected area.  You see a red streak or line that extends up or down from the affected area.  You have a breakdown or loss of skin in the affected area, even if the breakdown is small.  You have an injury to the affected area. SEEK IMMEDIATE MEDICAL CARE IF:   You have an injury and open wound in the affected  area.  Your pain is severe and does not improve with medicine.  You have sudden numbness or weakness in the foot or ankle below the affected area, or you have trouble moving your foot or ankle.  You have a fever or persistent symptoms for more than 2-3 days.  You have a fever and your symptoms suddenly get worse. MAKE SURE YOU:   Understand these instructions.  Will watch your condition.  Will get help right away if you are not doing well or get worse.   This information is not intended to replace advice given to you by your health care provider. Make sure you discuss any questions you have with your health care provider.   Document Released: 05/14/2006 Document Revised: 10/29/2012 Document Reviewed: 09/15/2012 Elsevier Interactive Patient Education 2016 [employer].  Vitamin K Foods and Warfarin Warfarin is a medicine that helps prevent harmful blood clots by causing blood to clot more slowly. It does this by decreasing the activity of vitamin K, which promotes normal blood clotting. For the dose of warfarin you have been prescribed to work well, you need to get about the same amount of vitamin K from your food from day to day. Suddenly getting a lot more vitamin K could cause your blood to clot too quickly. A  sudden decrease in vitamin K intake could cause your blood to clot too slowly. These changes in vitamin K intake could lead to dangerous blood clotsor to bleeding. WHAT GENERAL GUIDELINES DO I NEED TO FOLLOW?  Keep your intake of vitamin K consistent from day to day. To do this, you must be aware of which foods contain moderate or high amounts of vitamin K. Listed below are some foods that are very high, high, or moderately high in vitamin K. If you eat these foods, make sure you eat a consistent amount of them from day to day.  Avoid major changes in your diet, or tell your health care provider before changing your diet.  If you take a multivitamin that contains vitamin K,  be sure to take it every day.  If you drink green tea, drink the same amount each day. WHAT FOODS ARE VERY HIGH IN VITAMIN K?   Greens, such as Swiss chard and beet, collard, mustard, or turnip greens (fresh or frozen, cooked).  Kale (fresh or frozen, cooked).   Parsley (raw).  Spinach (cooked).  WHAT FOODS ARE HIGH IN VITAMIN K?  Asparagus (frozen, cooked).  Broccoli.   Bok choy (cooked).   Brussels sprouts (fresh or frozen, cooked).  Cabbage (cooked).  Coleslaw. WHAT FOODS ARE MODERATELY HIGH IN VITAMIN K?  Blueberries.  Black-eyed peas.  Endive (raw).   Green leaf lettuce (raw).   Green scallions (raw).  Kale (raw).  Okra (frozen, cooked).  Plantains (fried).  Romaine lettuce (raw).   Sauerkraut (canned).   Spinach (raw).   This information is not intended to replace advice given to you by your health care provider. Make sure you discuss any questions you have with your health care provider.   Document Released: 11/05/2008 Document Revised: 01/29/2014 Document Reviewed: 11/12/2012 Elsevier Interactive Patient Education [employer]2016 Elsevier Inc.

## 2015-09-29 ENCOUNTER — Emergency Department (HOSPITAL_COMMUNITY)
Admission: EM | Admit: 2015-09-29 | Discharge: 2015-09-30 | Disposition: A | Discharge status: Home / Self Care | Payer: Medicare HMO | Attending: Emergency Medicine | Admitting: Emergency Medicine

## 2015-09-29 ENCOUNTER — Encounter (HOSPITAL_COMMUNITY): Payer: Self-pay | Admitting: Emergency Medicine

## 2015-09-29 DIAGNOSIS — N183 Chronic kidney disease, stage 3 (moderate): Secondary | ICD-10-CM | POA: Insufficient documentation

## 2015-09-29 DIAGNOSIS — Z79899 Other long term (current) drug therapy: Secondary | ICD-10-CM | POA: Insufficient documentation

## 2015-09-29 DIAGNOSIS — Z7901 Long term (current) use of anticoagulants: Secondary | ICD-10-CM | POA: Diagnosis not present

## 2015-09-29 DIAGNOSIS — S0501XA Injury of conjunctiva and corneal abrasion without foreign body, right eye, initial encounter: Secondary | ICD-10-CM | POA: Insufficient documentation

## 2015-09-29 DIAGNOSIS — E1122 Type 2 diabetes mellitus with diabetic chronic kidney disease: Secondary | ICD-10-CM | POA: Diagnosis not present

## 2015-09-29 DIAGNOSIS — I129 Hypertensive chronic kidney disease with stage 1 through stage 4 chronic kidney disease, or unspecified chronic kidney disease: Secondary | ICD-10-CM | POA: Insufficient documentation

## 2015-09-29 DIAGNOSIS — Z87891 Personal history of nicotine dependence: Secondary | ICD-10-CM | POA: Insufficient documentation

## 2015-09-29 DIAGNOSIS — Y999 Unspecified external cause status: Secondary | ICD-10-CM | POA: Insufficient documentation

## 2015-09-29 DIAGNOSIS — I252 Old myocardial infarction: Secondary | ICD-10-CM | POA: Diagnosis not present

## 2015-09-29 DIAGNOSIS — Y9389 Activity, other specified: Secondary | ICD-10-CM | POA: Insufficient documentation

## 2015-09-29 DIAGNOSIS — Y92039 Unspecified place in apartment as the place of occurrence of the external cause: Secondary | ICD-10-CM | POA: Insufficient documentation

## 2015-09-29 DIAGNOSIS — J45909 Unspecified asthma, uncomplicated: Secondary | ICD-10-CM | POA: Insufficient documentation

## 2015-09-29 DIAGNOSIS — F0390 Unspecified dementia without behavioral disturbance: Secondary | ICD-10-CM | POA: Insufficient documentation

## 2015-09-29 DIAGNOSIS — S0591XA Unspecified injury of right eye and orbit, initial encounter: Secondary | ICD-10-CM | POA: Diagnosis present

## 2015-09-29 DIAGNOSIS — E039 Hypothyroidism, unspecified: Secondary | ICD-10-CM | POA: Diagnosis not present

## 2015-09-29 MED ORDER — ACETAMINOPHEN 500 MG PO TABS
1000.0000 mg | ORAL_TABLET | Freq: Once | ORAL | Status: AC
  Administered 2015-09-29: 1000 mg via ORAL
  Filled 2015-09-29: qty 2

## 2015-09-29 MED ORDER — TETRACAINE HCL 0.5 % OP SOLN
2.0000 [drp] | Freq: Once | OPHTHALMIC | Status: AC
  Administered 2015-09-29: 2 [drp] via OPHTHALMIC
  Filled 2015-09-29: qty 4

## 2015-09-29 MED ORDER — FLUORESCEIN SODIUM 1 MG OP STRP
1.0000 | ORAL_STRIP | Freq: Once | OPHTHALMIC | Status: AC
  Administered 2015-09-29: 1 via OPHTHALMIC
  Filled 2015-09-29: qty 1
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
## 2015-09-30 MED ORDER — ERYTHROMYCIN 5 MG/GM OP OINT: TOPICAL_OINTMENT | OPHTHALMIC | 0 refills | Status: DC

## 2015-12-10 ENCOUNTER — Inpatient Hospital Stay (HOSPITAL_COMMUNITY): Payer: Medicare HMO

## 2015-12-10 ENCOUNTER — Emergency Department (HOSPITAL_COMMUNITY): Payer: Medicare HMO

## 2015-12-10 ENCOUNTER — Encounter (HOSPITAL_COMMUNITY): Payer: Self-pay | Admitting: *Deleted

## 2015-12-10 ENCOUNTER — Inpatient Hospital Stay (HOSPITAL_COMMUNITY)
Admission: EM | Admit: 2015-12-10 | Discharge: 2015-12-14 | DRG: 690 | Disposition: A | Discharge status: Skilled Nursing Facility | Payer: Medicare HMO | Attending: Family Medicine | Admitting: Family Medicine

## 2015-12-10 DIAGNOSIS — I480 Paroxysmal atrial fibrillation: Secondary | ICD-10-CM | POA: Diagnosis present

## 2015-12-10 DIAGNOSIS — N400 Enlarged prostate without lower urinary tract symptoms: Secondary | ICD-10-CM | POA: Diagnosis present

## 2015-12-10 DIAGNOSIS — K5909 Other constipation: Secondary | ICD-10-CM | POA: Diagnosis present

## 2015-12-10 DIAGNOSIS — I130 Hypertensive heart and chronic kidney disease with heart failure and stage 1 through stage 4 chronic kidney disease, or unspecified chronic kidney disease: Secondary | ICD-10-CM | POA: Diagnosis present

## 2015-12-10 DIAGNOSIS — N178 Other acute kidney failure: Secondary | ICD-10-CM | POA: Diagnosis present

## 2015-12-10 DIAGNOSIS — Z7901 Long term (current) use of anticoagulants: Secondary | ICD-10-CM

## 2015-12-10 DIAGNOSIS — N183 Chronic kidney disease, stage 3 (moderate): Secondary | ICD-10-CM | POA: Diagnosis present

## 2015-12-10 DIAGNOSIS — J45909 Unspecified asthma, uncomplicated: Secondary | ICD-10-CM | POA: Diagnosis present

## 2015-12-10 DIAGNOSIS — Z823 Family history of stroke: Secondary | ICD-10-CM

## 2015-12-10 DIAGNOSIS — K222 Esophageal obstruction: Secondary | ICD-10-CM | POA: Diagnosis present

## 2015-12-10 DIAGNOSIS — M170 Bilateral primary osteoarthritis of knee: Secondary | ICD-10-CM | POA: Diagnosis present

## 2015-12-10 DIAGNOSIS — N179 Acute kidney failure, unspecified: Secondary | ICD-10-CM

## 2015-12-10 DIAGNOSIS — Z79899 Other long term (current) drug therapy: Secondary | ICD-10-CM

## 2015-12-10 DIAGNOSIS — N390 Urinary tract infection, site not specified: Secondary | ICD-10-CM | POA: Diagnosis present

## 2015-12-10 DIAGNOSIS — K219 Gastro-esophageal reflux disease without esophagitis: Secondary | ICD-10-CM | POA: Diagnosis present

## 2015-12-10 DIAGNOSIS — B9620 Unspecified Escherichia coli [E. coli] as the cause of diseases classified elsewhere: Secondary | ICD-10-CM | POA: Diagnosis present

## 2015-12-10 DIAGNOSIS — N189 Chronic kidney disease, unspecified: Secondary | ICD-10-CM

## 2015-12-10 DIAGNOSIS — Z86718 Personal history of other venous thrombosis and embolism: Secondary | ICD-10-CM

## 2015-12-10 DIAGNOSIS — I878 Other specified disorders of veins: Secondary | ICD-10-CM | POA: Diagnosis present

## 2015-12-10 DIAGNOSIS — R52 Pain, unspecified: Secondary | ICD-10-CM

## 2015-12-10 DIAGNOSIS — E1165 Type 2 diabetes mellitus with hyperglycemia: Secondary | ICD-10-CM | POA: Diagnosis present

## 2015-12-10 DIAGNOSIS — E039 Hypothyroidism, unspecified: Secondary | ICD-10-CM | POA: Diagnosis present

## 2015-12-10 DIAGNOSIS — M109 Gout, unspecified: Secondary | ICD-10-CM | POA: Diagnosis present

## 2015-12-10 DIAGNOSIS — M19019 Primary osteoarthritis, unspecified shoulder: Secondary | ICD-10-CM | POA: Diagnosis present

## 2015-12-10 DIAGNOSIS — R779 Abnormality of plasma protein, unspecified: Secondary | ICD-10-CM | POA: Diagnosis present

## 2015-12-10 DIAGNOSIS — Z8673 Personal history of transient ischemic attack (TIA), and cerebral infarction without residual deficits: Secondary | ICD-10-CM

## 2015-12-10 DIAGNOSIS — Z9842 Cataract extraction status, left eye: Secondary | ICD-10-CM

## 2015-12-10 DIAGNOSIS — I509 Heart failure, unspecified: Secondary | ICD-10-CM | POA: Diagnosis present

## 2015-12-10 DIAGNOSIS — F419 Anxiety disorder, unspecified: Secondary | ICD-10-CM | POA: Diagnosis present

## 2015-12-10 DIAGNOSIS — F329 Major depressive disorder, single episode, unspecified: Secondary | ICD-10-CM | POA: Diagnosis present

## 2015-12-10 DIAGNOSIS — I2510 Atherosclerotic heart disease of native coronary artery without angina pectoris: Secondary | ICD-10-CM | POA: Diagnosis present

## 2015-12-10 DIAGNOSIS — Z833 Family history of diabetes mellitus: Secondary | ICD-10-CM

## 2015-12-10 DIAGNOSIS — E860 Dehydration: Secondary | ICD-10-CM | POA: Diagnosis present

## 2015-12-10 DIAGNOSIS — H9190 Unspecified hearing loss, unspecified ear: Secondary | ICD-10-CM | POA: Diagnosis present

## 2015-12-10 DIAGNOSIS — E1122 Type 2 diabetes mellitus with diabetic chronic kidney disease: Secondary | ICD-10-CM | POA: Diagnosis present

## 2015-12-10 DIAGNOSIS — I252 Old myocardial infarction: Secondary | ICD-10-CM

## 2015-12-10 DIAGNOSIS — F0390 Unspecified dementia without behavioral disturbance: Secondary | ICD-10-CM | POA: Diagnosis present

## 2015-12-10 DIAGNOSIS — Z87891 Personal history of nicotine dependence: Secondary | ICD-10-CM

## 2015-12-10 DIAGNOSIS — M79604 Pain in right leg: Secondary | ICD-10-CM | POA: Diagnosis not present

## 2015-12-10 DIAGNOSIS — M79606 Pain in leg, unspecified: Secondary | ICD-10-CM | POA: Diagnosis present

## 2015-12-10 DIAGNOSIS — Z9841 Cataract extraction status, right eye: Secondary | ICD-10-CM

## 2015-12-10 DIAGNOSIS — Z86711 Personal history of pulmonary embolism: Secondary | ICD-10-CM

## 2015-12-10 DIAGNOSIS — Z955 Presence of coronary angioplasty implant and graft: Secondary | ICD-10-CM

## 2015-12-10 DIAGNOSIS — Z8249 Family history of ischemic heart disease and other diseases of the circulatory system: Secondary | ICD-10-CM

## 2015-12-10 DIAGNOSIS — N3000 Acute cystitis without hematuria: Secondary | ICD-10-CM | POA: Diagnosis not present

## 2015-12-10 DIAGNOSIS — Z801 Family history of malignant neoplasm of trachea, bronchus and lung: Secondary | ICD-10-CM

## 2015-12-10 DIAGNOSIS — E785 Hyperlipidemia, unspecified: Secondary | ICD-10-CM | POA: Diagnosis present

## 2015-12-10 DIAGNOSIS — M79605 Pain in left leg: Secondary | ICD-10-CM

## 2015-12-10 DIAGNOSIS — Z961 Presence of intraocular lens: Secondary | ICD-10-CM | POA: Diagnosis present

## 2015-12-10 LAB — CBC WITH DIFFERENTIAL/PLATELET
BASOS ABS: 0 10*3/uL (ref 0.0–0.1)
BASOS PCT: 0 %
Eosinophils Absolute: 0 10*3/uL (ref 0.0–0.7)
Eosinophils Relative: 0 %
HEMATOCRIT: 39.2 % (ref 39.0–52.0)
HEMOGLOBIN: 13.6 g/dL (ref 13.0–17.0)
LYMPHS PCT: 41 %
Lymphs Abs: 3.7 10*3/uL (ref 0.7–4.0)
MCH: 27.5 pg (ref 26.0–34.0)
MCHC: 34.7 g/dL (ref 30.0–36.0)
MCV: 79.2 fL (ref 78.0–100.0)
MONO ABS: 1.5 10*3/uL — AB (ref 0.1–1.0)
Monocytes Relative: 16 %
NEUTROS ABS: 3.8 10*3/uL (ref 1.7–7.7)
NEUTROS PCT: 43 %
Platelets: 200 10*3/uL (ref 150–400)
RBC: 4.95 MIL/uL (ref 4.22–5.81)
RDW: 16.2 % — AB (ref 11.5–15.5)
WBC: 9 10*3/uL (ref 4.0–10.5)

## 2015-12-10 LAB — COMPREHENSIVE METABOLIC PANEL
ALBUMIN: 3.2 g/dL — AB (ref 3.5–5.0)
ALK PHOS: 83 U/L (ref 38–126)
ALT: 15 U/L — ABNORMAL LOW (ref 17–63)
ANION GAP: 11 (ref 5–15)
AST: 22 U/L (ref 15–41)
BUN: 40 mg/dL — AB (ref 6–20)
CALCIUM: 9.6 mg/dL (ref 8.9–10.3)
CO2: 26 mmol/L (ref 22–32)
Chloride: 106 mmol/L (ref 101–111)
Creatinine, Ser: 3.22 mg/dL — ABNORMAL HIGH (ref 0.61–1.24)
GFR calc Af Amer: 19 mL/min — ABNORMAL LOW (ref >60)
GFR calc non Af Amer: 16 mL/min — ABNORMAL LOW (ref >60)
GLUCOSE: 121 mg/dL — AB (ref 65–99)
Potassium: 4 mmol/L (ref 3.5–5.1)
SODIUM: 143 mmol/L (ref 135–145)
Total Bilirubin: 0.7 mg/dL (ref 0.3–1.2)
Total Protein: 8.6 g/dL — ABNORMAL HIGH (ref 6.5–8.1)

## 2015-12-10 LAB — PROTIME-INR
INR: 3.43
Prothrombin Time: 35.4 seconds — ABNORMAL HIGH (ref 11.4–15.2)

## 2015-12-10 LAB — LIPASE, BLOOD: Lipase: 62 U/L — ABNORMAL HIGH (ref 11–51)

## 2015-12-10 LAB — URINALYSIS, ROUTINE W REFLEX MICROSCOPIC
Bilirubin Urine: NEGATIVE
Glucose, UA: NEGATIVE mg/dL
KETONES UR: NEGATIVE mg/dL
NITRITE: NEGATIVE
PROTEIN: 100 mg/dL — AB
Specific Gravity, Urine: 1.013 (ref 1.005–1.030)
pH: 6.5 (ref 5.0–8.0)

## 2015-12-10 LAB — URINE MICROSCOPIC-ADD ON: SQUAMOUS EPITHELIAL / LPF: NONE SEEN

## 2015-12-10 LAB — I-STAT TROPONIN, ED: Troponin i, poc: 0.05 ng/mL (ref 0.00–0.08)

## 2015-12-10 LAB — I-STAT CG4 LACTIC ACID, ED
LACTIC ACID, VENOUS: 1.5 mmol/L (ref 0.5–1.9)
Lactic Acid, Venous: 1.67 mmol/L (ref 0.5–1.9)

## 2015-12-10 LAB — GLUCOSE, CAPILLARY: GLUCOSE-CAPILLARY: 102 mg/dL — AB (ref 65–99)

## 2015-12-10 MED ORDER — LEVOTHYROXINE SODIUM 75 MCG PO TABS
75.0000 ug | ORAL_TABLET | Freq: Every day | ORAL | Status: DC
  Administered 2015-12-11 – 2015-12-14 (×4): 75 ug via ORAL
  Filled 2015-12-10 (×4): qty 1

## 2015-12-10 MED ORDER — SENNOSIDES-DOCUSATE SODIUM 8.6-50 MG PO TABS: 1.0000 | ORAL_TABLET | Freq: Every evening | ORAL | Status: DC | PRN

## 2015-12-10 MED ORDER — SODIUM CHLORIDE 0.9 % IV BOLUS (SEPSIS)
1000.0000 mL | Freq: Once | INTRAVENOUS | Status: AC
  Administered 2015-12-10: 1000 mL via INTRAVENOUS

## 2015-12-10 MED ORDER — DEXTROSE 5 % IV SOLN
1.0000 g | Freq: Once | INTRAVENOUS | Status: AC
  Administered 2015-12-10: 1 g via INTRAVENOUS
  Filled 2015-12-10: qty 10

## 2015-12-10 MED ORDER — FUROSEMIDE 20 MG PO TABS
80.0000 mg | ORAL_TABLET | Freq: Two times a day (BID) | ORAL | Status: DC
  Administered 2015-12-11: 80 mg via ORAL
  Filled 2015-12-10: qty 4

## 2015-12-10 MED ORDER — ONDANSETRON HCL 4 MG/2ML IJ SOLN: 4.0000 mg | Freq: Four times a day (QID) | INTRAMUSCULAR | Status: DC | PRN

## 2015-12-10 MED ORDER — DORZOLAMIDE HCL-TIMOLOL MAL 2-0.5 % OP SOLN
1.0000 [drp] | Freq: Two times a day (BID) | OPHTHALMIC | Status: DC
  Administered 2015-12-10 – 2015-12-14 (×8): 1 [drp] via OPHTHALMIC
  Filled 2015-12-10: qty 10

## 2015-12-10 MED ORDER — ISOSORBIDE MONONITRATE ER 30 MG PO TB24
60.0000 mg | ORAL_TABLET | Freq: Every day | ORAL | Status: DC
  Administered 2015-12-11 – 2015-12-14 (×4): 60 mg via ORAL
  Filled 2015-12-10 (×4): qty 2

## 2015-12-10 MED ORDER — PANTOPRAZOLE SODIUM 40 MG PO TBEC
40.0000 mg | DELAYED_RELEASE_TABLET | Freq: Every day | ORAL | Status: DC
  Administered 2015-12-11 – 2015-12-14 (×4): 40 mg via ORAL
  Filled 2015-12-10 (×4): qty 1

## 2015-12-10 MED ORDER — INSULIN ASPART 100 UNIT/ML ~~LOC~~ SOLN
0.0000 [IU] | Freq: Three times a day (TID) | SUBCUTANEOUS | Status: DC
  Administered 2015-12-11: 2 [IU] via SUBCUTANEOUS
  Administered 2015-12-11: 1 [IU] via SUBCUTANEOUS
  Administered 2015-12-12: 2 [IU] via SUBCUTANEOUS
  Administered 2015-12-12: 1 [IU] via SUBCUTANEOUS
  Administered 2015-12-13: 2 [IU] via SUBCUTANEOUS
  Administered 2015-12-13: 1 [IU] via SUBCUTANEOUS
  Administered 2015-12-13: 2 [IU] via SUBCUTANEOUS

## 2015-12-10 MED ORDER — ENOXAPARIN SODIUM 30 MG/0.3ML ~~LOC~~ SOLN: 30.0000 mg | SUBCUTANEOUS | Status: DC

## 2015-12-10 MED ORDER — WARFARIN - PHARMACIST DOSING INPATIENT
Freq: Every day | Status: DC
  Administered 2015-12-12 – 2015-12-13 (×2)

## 2015-12-10 MED ORDER — SODIUM CHLORIDE 0.9 % IV SOLN
Freq: Once | INTRAVENOUS | Status: AC
  Administered 2015-12-10: 23:00:00 via INTRAVENOUS

## 2015-12-10 MED ORDER — ONDANSETRON HCL 4 MG PO TABS: 4.0000 mg | ORAL_TABLET | Freq: Four times a day (QID) | ORAL | Status: DC | PRN

## 2015-12-10 MED ORDER — AMIODARONE HCL 200 MG PO TABS
200.0000 mg | ORAL_TABLET | Freq: Every day | ORAL | Status: DC
Start: 2015-12-11 — End: 2015-12-14
  Administered 2015-12-11 – 2015-12-14 (×4): 200 mg via ORAL
  Filled 2015-12-10 (×4): qty 1

## 2015-12-10 MED ORDER — LATANOPROST 0.005 % OP SOLN
1.0000 [drp] | Freq: Every day | OPHTHALMIC | Status: DC
  Administered 2015-12-10 – 2015-12-13 (×4): 1 [drp] via OPHTHALMIC
  Filled 2015-12-10: qty 2.5

## 2015-12-10 MED ORDER — HYDRALAZINE HCL 20 MG/ML IJ SOLN
5.0000 mg | Freq: Four times a day (QID) | INTRAMUSCULAR | Status: DC | PRN
  Administered 2015-12-11: 5 mg via INTRAVENOUS
  Filled 2015-12-10: qty 1

## 2015-12-10 MED ORDER — CARVEDILOL 3.125 MG PO TABS
3.1250 mg | ORAL_TABLET | Freq: Two times a day (BID) | ORAL | Status: DC
  Administered 2015-12-11: 3.125 mg via ORAL
  Filled 2015-12-10: qty 1

## 2015-12-10 MED ORDER — DIVALPROEX SODIUM 125 MG PO DR TAB
125.0000 mg | DELAYED_RELEASE_TABLET | Freq: Two times a day (BID) | ORAL | Status: DC
  Administered 2015-12-10 – 2015-12-14 (×8): 125 mg via ORAL
  Filled 2015-12-10 (×8): qty 1

## 2015-12-10 MED ORDER — TAMSULOSIN HCL 0.4 MG PO CAPS
0.8000 mg | ORAL_CAPSULE | Freq: Every day | ORAL | Status: DC
  Administered 2015-12-11 – 2015-12-13 (×3): 0.8 mg via ORAL
  Filled 2015-12-10 (×4): qty 2

## 2015-12-10 NOTE — ED Provider Notes (Signed)
[facility]-EMERGENCY DEPT Provider Note   CSN: [id number] Arrival date & time: 12/10/15  1332     History   Chief Complaint Chief Complaint  Patient presents with  . Leg Pain    HPI [name] is a 82 y.o. male.  HPI 82 yo Male with past medical history of dementia who presents with multiple complaints. History is limited as patient is demented and a poor historian. He reports multiple complaints that all seem chronic in nature. When asked why he is here today, he states that his bilateral legs have become increasingly swollen and painful over the last several weeks. He has a history of DVTs as well as gout and believes this pain is from his gout. The pain is made worse with ambulation or weightbearing. Denies any associated chest pain or shortness of breath. The patient also endorses increasingly severe aching, throbbing, cramp-like suprapubic pain and states his urine looks like "chocolate milk." Denies any associated fevers but has had some flank pain. He has also had decreased appetite and energy level and has been unable to get around his house for the last several days.  Level 5 caveat invoked as remainder of history, ROS, and physical exam limited due to patient's dementia.   Past Medical History:  Diagnosis Date  . Anginal pain (HCC)   . Anxiety   . Arthritis    "mostly in my knees and shoulders"  . Asthma    "touch"  . Chronic abdominal pain   . Chronic back pain   . Collapsed lung    "for 14 years" right   . Complication of anesthesia   . Coronary artery disease   . Depression   . Diabetes mellitus   . DVT, lower extremity (HCC) 1980's   LLE  . Esophageal stricture   . GERD (gastroesophageal reflux disease)   . Glaucoma   . Gout    no problems in 2 years  . Hearing loss   . Hemorrhoids   . Hiatal hernia   . Hyperlipemia   . Hypertension   . Hypothyroidism   . Myocardial infarction 1987; 1989  . Paroxysmal atrial fibrillation (HCC)   . Pneumonia 1970's  .  PONV (postoperative nausea and vomiting)   . Pulmonary embolism, bilateral (HCC) 2010  . Renal disorder ~ 03/2011   "wasn't functioning right"  . Schatzki's ring   . Stroke [facility](HCC)    "just a little one"    Patient Active Problem List   Diagnosis Date Noted  . UTI (urinary tract infection) 12/10/2015  . Chronic RLQ pain 09/10/2014  . Chest pain 01/11/2014  . Chest pain at rest 01/08/2014  . Unstable angina (HCC) 07/15/2013  . Abdominal pain, unspecified site 06/02/2012  . Schatzki's ring - nonobstructive 05/31/2012  . Dementia, senile 05/20/2012  . Constipation, chronic 01/28/2012  . Esophageal stricture 01/28/2012  . Paraesophageal sliding hiatal hernia 01/28/2012  . Anticoagulated on warfarin 01/28/2012  . DJD (degenerative joint disease) of knee 09/19/2011  . GERD (gastroesophageal reflux disease) 09/19/2011  . CKD (chronic kidney disease) stage 3, GFR 30-59 ml/min 08/08/2011  . BPH (benign prostatic hyperplasia) 08/08/2011  . Type II or unspecified type diabetes mellitus with renal manifestations, uncontrolled(250.42) 03/21/2006  . HYPERTENSION, BENIGN SYSTEMIC 03/21/2006  . CLAUDICATION, INTERMITTENT 03/21/2006  . RHINITIS, ALLERGIC 03/21/2006    Past Surgical History:  Procedure Laterality Date  . APPENDECTOMY  1950  . CARDIAC CATHETERIZATION    . CATARACT EXTRACTION W/ INTRAOCULAR LENS  IMPLANT, BILATERAL Bilateral 1990's  .  COLONOSCOPY N/A 05/01/2012   Procedure: COLONOSCOPY;  Surgeon: Theda BelfastPatrick D Hung, MD;  [facility];  Service: Endoscopy;  Laterality: N/A;  . CORONARY ANGIOPLASTY WITH STENT PLACEMENT     "think I have 4 total"  . ESOPHAGOGASTRODUODENOSCOPY  12/22/2010   Procedure: ESOPHAGOGASTRODUODENOSCOPY (EGD);  Surgeon: Theda BelfastPatrick D Hung;  Location: [facility] ENDOSCOPY;  Service: Endoscopy;  Laterality: N/A;  . ESOPHAGOGASTRODUODENOSCOPY N/A 04/30/2012   Procedure: ESOPHAGOGASTRODUODENOSCOPY (EGD);  Surgeon: Theda BelfastPatrick D Hung, MD;  [facility];  Service:  Endoscopy;  Laterality: N/A;  . HERNIA REPAIR    . INGUINAL HERNIA REPAIR Bilateral 03/28/2012   Procedure: LAPAROSCOPIC BILATERAL INGUINAL HERNIA REPAIR;  Surgeon: Ardeth SportsmanSteven C. Gross, MD;  Location: [facility] ORS;  Service: General;  Laterality: Bilateral;  . INSERTION OF MESH Bilateral 03/28/2012   Procedure: INSERTION OF MESH;  Surgeon: Ardeth SportsmanSteven C. Gross, MD;  Location: [facility] ORS;  Service: General;  Laterality: Bilateral;  . LEFT HEART CATHETERIZATION WITH CORONARY ANGIOGRAM N/A 02/19/2011   Procedure: LEFT HEART CATHETERIZATION WITH CORONARY ANGIOGRAM;  Surgeon: Robynn PaneMohan N Harwani, MD;  [facility];  Service: Cardiovascular;  Laterality: N/A;       Home Medications    Prior to Admission medications   Medication Sig Start Date End Date Taking? Authorizing Provider  acetaminophen (TYLENOL) 500 MG tablet Take 500-1,000 mg by mouth every 6 (six) hours as needed for headache (pain).   Yes Historical Provider, MD  amiodarone (PACERONE) 200 MG tablet Take 200 mg by mouth daily.    Yes Historical Provider, MD  carvedilol (COREG) 3.125 MG tablet Take 3.125 mg by mouth 2 (two) times daily with a meal. 8am, 6pm   Yes Historical Provider, MD  divalproex (DEPAKOTE) 125 MG DR tablet Take 125 mg by mouth 2 (two) times daily.   Yes Historical Provider, MD  furosemide (LASIX) 40 MG tablet Take 80 mg by mouth 2 (two) times daily.    Yes Historical Provider, MD  isosorbide mononitrate (IMDUR) 60 MG 24 hr tablet Take 60 mg by mouth daily.   Yes Historical Provider, MD  levothyroxine (SYNTHROID, LEVOTHROID) 75 MCG tablet Take 75 mcg by mouth daily before breakfast.   Yes Historical Provider, MD  nitroGLYCERIN (NITROSTAT) 0.4 MG SL tablet Place 1 tablet (0.4 mg total) under the tongue every 5 (five) minutes as needed for chest pain. 04/24/14  Yes Garlon HatchetLisa M Sanders, PA-C  omeprazole (PRILOSEC) 20 MG capsule Take 20 mg by mouth daily.   Yes Historical Provider, MD  tamsulosin (FLOMAX) 0.4 MG CAPS capsule Take 0.8 mg by mouth  at bedtime.    Yes Historical Provider, MD  warfarin (COUMADIN) 4 MG tablet Take 2-4 mg by mouth See admin instructions. Take 1/2 tablet (2 mg) by mouth on Sunday evening, take 1 tablet (4 mg) on Monday thru Saturday evenings   Yes Historical Provider, MD  dorzolamide-timolol (COSOPT) 22.3-6.8 MG/ML ophthalmic solution Place 1 drop into the right eye 2 (two) times daily.     Historical Provider, MD  erythromycin ophthalmic ointment Place a 1/2 inch ribbon of ointment into the lower eyelid. 09/30/15   Melene Planan Floyd, DO  latanoprost (XALATAN) 0.005 % ophthalmic solution Place 1 drop into both eyes at bedtime.    Historical Provider, MD  ondansetron (ZOFRAN ODT) 4 MG disintegrating tablet 4mg  ODT q4 hours prn nausea/vomit Patient not taking: Reported on 12/10/2015 03/27/14   Purvis SheffieldForrest Harrison, MD    Family History Family History  Problem Relation Age of Onset  . Diabetes Mother   .  Heart disease Father   . Stroke Father   . Lung cancer Brother   . Hypertension Neg Hx   . Hyperlipidemia Neg Hx   . Alcohol abuse Neg Hx   . Colon cancer Neg Hx     Social History Social History  Substance Use Topics  . Smoking status: Former Smoker    Packs/day: 2.00    Years: 3.00    Types: Cigarettes    Start date: 01/27/1946    Quit date: 01/22/1981  . Smokeless tobacco: Never Used  . Alcohol use No     Comment: 07/12/2011; "last drink of alcohol was ~ 1983"     Allergies   Patient has no known allergies.   Review of Systems Review of Systems  Constitutional: Positive for fatigue. Negative for chills and fever.  HENT: Negative for congestion and rhinorrhea.   Eyes: Negative for visual disturbance.  Respiratory: Positive for cough. Negative for shortness of breath and wheezing.   Cardiovascular: Positive for leg swelling. Negative for chest pain.  Gastrointestinal: Positive for abdominal pain, nausea and vomiting. Negative for diarrhea.  Genitourinary: Positive for dysuria and frequency. Negative for  discharge, flank pain and penile swelling.  Musculoskeletal: Negative for neck pain and neck stiffness.  Skin: Negative for rash and wound.  Allergic/Immunologic: Negative for immunocompromised state.  Neurological: Positive for weakness. Negative for syncope and headaches.  All other systems reviewed and are negative.    Physical Exam Updated Vital Signs BP (!) 171/66 (BP Location: Left Arm)   Pulse 77   Temp 98.5 F (36.9 C) (Oral)   Resp 17   SpO2 96%   Physical Exam  Constitutional: He is oriented to person, place, and time. He appears well-developed and well-nourished. No distress.  HENT:  Head: Normocephalic and atraumatic.  Mouth/Throat: Oropharynx is clear and moist. No oropharyngeal exudate.  Eyes: Conjunctivae are normal. Pupils are equal, round, and reactive to light.  Neck: Normal range of motion. Neck supple.  Cardiovascular: Normal rate, regular rhythm and normal heart sounds.  Exam reveals no friction rub.   No murmur heard. Pulmonary/Chest: Effort normal and breath sounds normal. No respiratory distress. He has no wheezes. He has no rales.  Abdominal: Soft. He exhibits no distension. There is tenderness (Moderate, suprapubic, without rebound or guarding). There is no rebound and no guarding.  Musculoskeletal: He exhibits edema.  1+ edema bilateral lower extremities with diffuse tenderness to palpation of her posterior legs  Neurological: He is alert and oriented to person, place, and time. He exhibits normal muscle tone.  Skin: Skin is warm. Capillary refill takes less than 2 seconds.  Psychiatric: He has a normal mood and affect.  Nursing note and vitals reviewed.    ED Treatments / Results  Labs (all labs ordered are listed, but only abnormal results are displayed) Labs Reviewed  URINALYSIS, ROUTINE W REFLEX MICROSCOPIC (NOT AT [facility]) - Abnormal; Notable for the following:       Result Value   APPearance TURBID (*)    Hgb urine dipstick MODERATE (*)     Protein, ur 100 (*)    Leukocytes, UA LARGE (*)    All other components within normal limits  CBC WITH DIFFERENTIAL/PLATELET - Abnormal; Notable for the following:    RDW 16.2 (*)    Monocytes Absolute 1.5 (*)    All other components within normal limits  COMPREHENSIVE METABOLIC PANEL - Abnormal; Notable for the following:    Glucose, Bld 121 (*)    BUN 40 (*)  Creatinine, Ser 3.22 (*)    Total Protein 8.6 (*)    Albumin 3.2 (*)    ALT 15 (*)    GFR calc non Af Amer 16 (*)    GFR calc Af Amer 19 (*)    All other components within normal limits  LIPASE, BLOOD - Abnormal; Notable for the following:    Lipase 62 (*)    All other components within normal limits  URINE MICROSCOPIC-ADD ON - Abnormal; Notable for the following:    Bacteria, UA MANY (*)    All other components within normal limits  PROTIME-INR - Abnormal; Notable for the following:    Prothrombin Time 35.4 (*)    All other components within normal limits  GLUCOSE, CAPILLARY - Abnormal; Notable for the following:    Glucose-Capillary 102 (*)    All other components within normal limits  URINE CULTURE  BRAIN NATRIURETIC PEPTIDE  COMPREHENSIVE METABOLIC PANEL  CBC  PROTIME-INR  I-STAT CG4 LACTIC ACID, ED  I-STAT TROPOININ, ED  I-STAT CG4 LACTIC ACID, ED    EKG  EKG Interpretation  Date/Time:  Saturday December 10 2015 17:56:39 EST Ventricular Rate:  84 PR Interval:    QRS Duration: 141 QT Interval:  408 QTC Calculation: 483 R Axis:   138 Text Interpretation:  Sinus rhythm Multiple ventricular premature complexes Anterior infarct, acute (LAD) No significant change since last tracing PVCs evident Confirmed by Phu Record MD, Jakyrie Totherow [phone]) on 12/10/2015 6:00:13 PM       Radiology Dg Chest 2 View  Result Date: 12/10/2015 CLINICAL DATA:  Cough EXAM: CHEST  2 VIEW COMPARISON:  July 17, 2015 FINDINGS: There is no edema or consolidation. Heart is borderline enlarged with pulmonary vascularity within normal  limits. No adenopathy. There is atherosclerotic calcification aorta. Bones are osteoporotic. IMPRESSION: No edema or consolidation. Heart borderline enlarged. Aortic atherosclerosis. Electronically Signed   By: Bretta Bang III M.D.   On: 12/10/2015 16:40   Dg Abd 2 Views  Result Date: 12/10/2015 CLINICAL DATA:  Abdominal pain EXAM: ABDOMEN - 2 VIEW COMPARISON:  CT abdomen pelvis 08/18/2014 FINDINGS: Nonobstructive bowel gas pattern. Minimal small bowel gas identified. No abnormal calculi or other focal abnormality. No portal venous gas or pneumoperitoneum. Lumbar levoscoliosis. Visualized lung bases are unremarkable. IMPRESSION: No radiographic evidence of obstruction. No free intraperitoneal air. Electronically Signed   By: Deatra Robinson M.D.   On: 12/10/2015 21:56    Procedures Procedures (including critical care time)  Medications Ordered in ED Medications  pantoprazole (PROTONIX) EC tablet 40 mg (not administered)  latanoprost (XALATAN) 0.005 % ophthalmic solution 1 drop (1 drop Both Eyes Given 12/10/15 2318)  levothyroxine (SYNTHROID, LEVOTHROID) tablet 75 mcg (not administered)  divalproex (DEPAKOTE) DR tablet 125 mg (125 mg Oral Given 12/10/15 2317)  dorzolamide-timolol (COSOPT) 22.3-6.8 MG/ML ophthalmic solution 1 drop (1 drop Right Eye Given 12/10/15 2319)  isosorbide mononitrate (IMDUR) 24 hr tablet 60 mg (not administered)  tamsulosin (FLOMAX) capsule 0.8 mg (not administered)  amiodarone (PACERONE) tablet 200 mg (not administered)  carvedilol (COREG) tablet 3.125 mg (not administered)  furosemide (LASIX) tablet 80 mg (not administered)  senna-docusate (Senokot-S) tablet 1 tablet (not administered)  ondansetron (ZOFRAN) tablet 4 mg (not administered)    Or  ondansetron (ZOFRAN) injection 4 mg (not administered)  Warfarin - Pharmacist Dosing Inpatient (0 each Does not apply Hold 12/10/15 2022)  hydrALAZINE (APRESOLINE) injection 5 mg (not administered)  insulin aspart  (novoLOG) injection 0-9 Units (not administered)  cefTRIAXone (ROCEPHIN) 1 g in  dextrose 5 % 50 mL IVPB (1 g Intravenous New Bag/Given 12/10/15 1918)  sodium chloride 0.9 % bolus 1,000 mL (1,000 mLs Intravenous New Bag/Given 12/10/15 1918)  0.9 %  sodium chloride infusion ( Intravenous New Bag/Given 12/10/15 2314)     Initial Impression / Assessment and Plan / ED Course  I have reviewed the triage vital signs and the nursing notes.  Pertinent labs & imaging results that were available during my care of the patient were reviewed by me and considered in my medical decision making (see chart for details).  Clinical Course     82 year old male with history of dementia here with multiple complaints, primarily bilateral leg pain, suprapubic pain, and general fatigue. History limited due to dementia and poor historian. On arrival, patient hypertensive but otherwise hemodynamically stable. Examination is as above. Screening lab work obtained and shows evidence of UTI with likely acute on chronic kidney injury in setting of poor by mouth intake due to fatigue, leg pain, and inability to move around his house. Troponin is negative. White count is normal, lactate is normal, and he has no evidence of severe sepsis. DVT study of lower extremities ordered and he has no hypoxia or evidence of PE at this time. Given his UTI with AK I, as well as dementia, and apparent poor care at home, will admit for IV fluids, antibiotics, and further assessment.  Final Clinical Impressions(s) / ED Diagnoses   Final diagnoses:  Pain  Acute renal failure superimposed on chronic kidney disease, unspecified CKD stage, unspecified acute renal failure type (HCC)  Acute cystitis without hematuria  Dehydration  Pain in both lower extremities    New Prescriptions Current Discharge Medication List       Shaune Pollack, MD 12/10/15 2337

## 2015-12-10 NOTE — ED Notes (Signed)
Pt had episode of loose stools and urinated on floor while in waiting room. EMT reported pts urine appeared thick and discolored.

## 2015-12-10 NOTE — ED Notes (Signed)
MD Isaacs at bedside  

## 2015-12-10 NOTE — H&P (Signed)
[facility] Admission History and Physical Service Pager: [phone]  Patient name: [name] Medical record number: [id number] Date of birth: 05/08/1929 Age: 82 y.o. Gender: male  Primary Care Provider: Leamon Arnt, MD Consultants: None  Code Status: Full   Chief Complaint: Milky urine and Pain with urination   Assessment and Plan: [name] is a 82 y.o. male presenting with dsyuria and milky colored urine. PMH is significant for T2DM, HTN, Esophageal stricture, Dementia, CKD stage 3, BPH, CAD,  PAF, Hx of DVTs( 1980s), hx of stroke  Dysuria: Presenting with dysuria, suprapubic pain, nausea. Recent evaluation in the family medicine office noted E coli on urine culture on 11/15 with completely sensitive, Augmentin was prescribed on the 11/17. Per patient not taking. UA positive for moderate hemoglobin, large leukocytes, microscopy with too many to count bacteria. Lactic acid wnl.  As patient is an elderly man consider this a complicated UTI, with history of BPH noted on problem list concern for urinary retention. - Admit to inpatient service MedSurg, Dr. Erin Hearing -Urine culture pending -Continue ceftriaxone IV, will transition to by mouth tomorrow -CBC and BMET in the AM  -post-void residual   AoCKD stage 3: Baseline (SCr 1.8 -2.0) history of hypertension in type 2 diabetes likely contributed to patient's chronic kidney disease. Patient possibly with acute kidney injury in part due to infection versus prerenal with a decrease in fluid intake due to lack of appetite.  - Will continue to follow BMET  - NS 100 ml per hr   Bilateral leg edema: Chart reviewed this has been treated with Lasix, compression stockings in the past. Has a history of DVT per record review. Patient with positive Homans sign on left leg and calf tenderness. However complains that both legs are painful and tender to the touch. 2+ pitting edema noted on both legs, with venous stasis  changes. Based on wells criteria high risk for DVT.  - Will rule out DVT with LE Dopplers  - Warfarin for treatment - INR 3.43   Elevated Total Protein 8.6. Given worsening renal function could consider.and Hx of fatigue per patient, could consider Multiple Myeloma, though calcium not elevated.  - Would consider obtain SPEP and UPEP   Abdominal Pain:  Likely due to UTI, RLQ pain noted intermittently, though not consistent on exam. Alvarodo Criteria for possibly appendicitis 3, therefore unlikely. Review of chart does have hx of Chronic RLQ pain followed by GI. Abnormal CTscan July 2016. A focal soft tissue mass was seen at ampulla of vater.  - Abdominal xray  - Lactic Acid wnl   HFpEF/CAD: Last echo with ejection fraction of 50-55% (2015). Asymmetric hypertrophy, GDD1. Per review of chart patient followed by family medicine, thought patient does indicates seeing cardiologist, Dr Terrence Dupont 2 months ago. Denies dyspnea or orthopnea. CXR negative for edema -  BNP  -  Coreg 3.125 BID and Lasix 80 BID   PAF/Hx of DVT- INR 3.43, slightly supratheraputic. Goal INR 2.0-3.0  - Amiodarone, Coreg - Warfarin per pharmacy   Dementia - Per chart review noted however no formula evaluation has been done. Currently lives by himself, wonder about long term capacity, Alert and orientated self, place, and year. Did not know who the current or past president was.   - Possibly on Depakote for Dementia, patient not sure  - Case management and social work  - PT and OT consult   Hypothyroidism:  - TSH pending  -  Synthroid   T2DM. A1C 6.3  09/16/2015.  - Not currently on any medication per chart review  -  Sensitive SSI  HTN: Blood pressure elevated on admission  - IMDUR 60 mg, Coreg 3.125 BID,  -  Hydralazine PRN for elevated blood pressures 180/110  FEN/GI: PPI Prophylaxis: Wafarin   Disposition: Home vs. SNF   History of Present Illness:  [name] is a 82 y.o. male presenting complaints of pain  with urination and milky colored urine. Recent is difficult to understand and suffers from dementia, therefore could not get a clear idea of history of presenting illness. Per patient he states that he has had pain with urination and milky urine for a couple of weeks. He is not sure exactly when it started. He states that he is at times nauseated, however he cannot give me an exact time when he has been nauseated. He denies any fevers. He indicates that sometimes he does not have an appetite. He also indicates that he has some swelling in his legs this is been going on for a couple of weeks. He notes that he is tender to palpation of his legs bilaterally. He indicates being constipated, and states he had bowel movement several days ago. He denies being short of breath or having chest pain. He denies any recent falls. He indicates that he lives alone. However he has a son and daughter who come to visit him at times. He has not notified them that he is in the hospital currently. Denies SOB or worsening dyspnea. When asked if he still able to walk around, he states he is able to get around at times.   Per review of records, patient was last seen at [facility] family medicine, with complaints of milky urine, he is was also complaining at that time that his stomach was hurting him (this is chronic). Urine culture was positive for Escherichia coli on 11/15. However patient was not started on any antibiotic. Patient has a history of bilateral lower extremity edema in the past.   Review Of Systems: Per HPI with the following additions:   Review of Systems  Constitutional: Positive for malaise/fatigue and weight loss.  HENT: Negative for congestion.   Respiratory: Negative for cough and shortness of breath.   Cardiovascular: Positive for leg swelling. Negative for chest pain.  Gastrointestinal: Positive for constipation, nausea and vomiting.  Genitourinary: Positive for dysuria and frequency.  Musculoskeletal:  Positive for joint pain.  Neurological: Positive for dizziness. Negative for sensory change, focal weakness and loss of consciousness.    Patient Active Problem List   Diagnosis Date Noted  . Chronic RLQ pain 09/10/2014  . Chest pain 01/11/2014  . Chest pain at rest 01/08/2014  . Unstable angina ([place]) 07/15/2013  . Abdominal pain, unspecified site 06/02/2012  . Schatzki's ring - nonobstructive 05/31/2012  . Dementia, senile 05/20/2012  . Constipation, chronic 01/28/2012  . Esophageal stricture 01/28/2012  . Paraesophageal sliding hiatal hernia 01/28/2012  . Anticoagulated on warfarin 01/28/2012  . DJD (degenerative joint disease) of knee 09/19/2011  . GERD (gastroesophageal reflux disease) 09/19/2011  . CKD (chronic kidney disease) stage 3, GFR 30-59 ml/min 08/08/2011  . BPH (benign prostatic hyperplasia) 08/08/2011  . Type II or unspecified type diabetes mellitus with renal manifestations, uncontrolled(250.42) 03/21/2006  . HYPERTENSION, BENIGN SYSTEMIC 03/21/2006  . CLAUDICATION, INTERMITTENT 03/21/2006  . RHINITIS, ALLERGIC 03/21/2006    Past Medical History: Past Medical History:  Diagnosis Date  . Anginal pain ([place])   . Anxiety   . Arthritis    "  mostly in my knees and shoulders"  . Asthma    "touch"  . Chronic abdominal pain   . Chronic back pain   . Collapsed lung    "for 14 years" right   . Complication of anesthesia   . Coronary artery disease   . Depression   . Diabetes mellitus   . DVT, lower extremity (HCC) 1980's   LLE  . Esophageal stricture   . GERD (gastroesophageal reflux disease)   . Glaucoma   . Gout    no problems in 2 years  . Hearing loss   . Hemorrhoids   . Hiatal hernia   . Hyperlipemia   . Hypertension   . Hypothyroidism   . Myocardial infarction 1987; 1989  . Paroxysmal atrial fibrillation (HCC)   . Pneumonia 1970's  . PONV (postoperative nausea and vomiting)   . Pulmonary embolism, bilateral ([place]) 2010  . Renal disorder ~ 03/2011    "wasn't functioning right"  . Schatzki's ring   . Stroke [facility])    "just a little one"    Past Surgical History: Past Surgical History:  Procedure Laterality Date  . APPENDECTOMY  1950  . CARDIAC CATHETERIZATION    . CATARACT EXTRACTION W/ INTRAOCULAR LENS  IMPLANT, BILATERAL Bilateral 1990's  . COLONOSCOPY N/A 05/01/2012   Procedure: COLONOSCOPY;  Surgeon: Beryle Beams, MD;  Location: [facility];  Service: Endoscopy;  Laterality: N/A;  . CORONARY ANGIOPLASTY WITH STENT PLACEMENT     "think I have 4 total"  . ESOPHAGOGASTRODUODENOSCOPY  12/22/2010   Procedure: ESOPHAGOGASTRODUODENOSCOPY (EGD);  Surgeon: Beryle Beams;  Location: [facility] ENDOSCOPY;  Service: Endoscopy;  Laterality: N/A;  . ESOPHAGOGASTRODUODENOSCOPY N/A 04/30/2012   Procedure: ESOPHAGOGASTRODUODENOSCOPY (EGD);  Surgeon: Beryle Beams, MD;  Location[facility] END[facility]COPY;  Service: Endoscopy;  Laterality: N/A;  . HERNIA REPAIR    . INGUINAL HERNIA REPAIR Bilateral 03/28/2012   Procedure: LAPAROSCOPIC BILATERAL INGUINAL HERNIA REPAIR;  Surgeon: Adin Hector, MD;  Location: [facility] ORS;  Service: General;  Laterality: Bilateral;  . INSERTION OF MESH Bilateral 03/28/2012   Procedure: INSERTION OF MESH;  Surgeon: Adin Hector, MD;  Location: [facility] ORS;  Service: General;  Laterality: Bilateral;  . LEFT HEART CATHETERIZATION WITH CORONARY ANGIOGRAM N/A 02/19/2011   Procedure: LEFT HEART CATHETERIZATION WITH CORONARY ANGIOGRAM;  Surgeon: Clent Demark, MD;  Location: [facility] CATH LAB;  Service: Cardiovascular;  Laterality: N/A;    Social History: Social History  Substance Use Topics  . Smoking status: Former Smoker    Packs/day: 2.00    Years: 3.00    Types: Cigarettes    Start date: 01/27/1946    Quit date: 01/22/1981  . Smokeless tobacco: Never Used  . Alcohol use No     Comment: 07/12/2011; "last drink of alcohol was ~ 1983"   Additional social history: Denies any drug or EtOH use   Please also refer to relevant sections of  EMR.  Family History: Family History  Problem Relation Age of Onset  . Diabetes Mother   . Heart disease Father   . Stroke Father   . Lung cancer Brother   . Hypertension Neg Hx   . Hyperlipidemia Neg Hx   . Alcohol abuse Neg Hx   . Colon cancer Neg Hx    Allergies and Medications: No Known Allergies No current facility-administered medications on file prior to encounter.    Current Outpatient Prescriptions on File Prior to Encounter  Medication Sig Dispense Refill  . amiodarone (PACERONE) 200 MG tablet  Take 200 mg by mouth daily.     . carvedilol (COREG) 3.125 MG tablet Take 3.125 mg by mouth 2 (two) times daily with a meal. 8am, 6pm    . divalproex (DEPAKOTE) 125 MG DR tablet Take 125 mg by mouth 2 (two) times daily.    . furosemide (LASIX) 40 MG tablet Take 80 mg by mouth 2 (two) times daily.     . isosorbide mononitrate (IMDUR) 60 MG 24 hr tablet Take 60 mg by mouth daily.    Marland Kitchen levothyroxine (SYNTHROID, LEVOTHROID) 75 MCG tablet Take 75 mcg by mouth daily before breakfast.    . omeprazole (PRILOSEC) 20 MG capsule Take 20 mg by mouth daily.    Marland Kitchen warfarin (COUMADIN) 4 MG tablet Take 2-4 mg by mouth See admin instructions. Take 1/2 tablet (2 mg) by mouth on Sunday evening, take 1 tablet (4 mg) on Monday thru Saturday evenings    . dorzolamide-timolol (COSOPT) 22.3-6.8 MG/ML ophthalmic solution Place 1 drop into the right eye 2 (two) times daily.     Marland Kitchen erythromycin ophthalmic ointment Place a 1/2 inch ribbon of ointment into the lower eyelid. 3.5 g 0  . latanoprost (XALATAN) 0.005 % ophthalmic solution Place 1 drop into both eyes at bedtime.    . nitroGLYCERIN (NITROSTAT) 0.4 MG SL tablet Place 1 tablet (0.4 mg total) under the tongue every 5 (five) minutes as needed for chest pain. 30 tablet 0  . ondansetron (ZOFRAN ODT) 4 MG disintegrating tablet 61m ODT q4 hours prn nausea/vomit (Patient not taking: Reported on 09/29/2015) 15 tablet 0  . tamsulosin (FLOMAX) 0.4 MG CAPS capsule Take  0.8 mg by mouth daily after supper.       Objective: BP 161/82   Pulse 80   Temp 98 F (36.7 C) (Oral)   Resp 20   SpO2 100%  Exam: General: Elderly gentlemen, sitting in bed, NAD Eyes: Catract in right significantly distorting view of pupil, left eye pupil equal reactive  ENTM: Moist mucosa membranes  Neck: No lymphadenopathy  Cardiovascular: RRR, no murmurs  Respiratory: CTAB, limited effort per patient no increased WOB  Gastrointestinal:  Suprapubic tenderness to palpation, some RLQ tenderness at times, however not consistently reproducible  MSK: No tenderness in joints, bilateral lower extremity edema 2+  Derm: Dry skin, no ulcers noted  Neuro: Upper and lower extremities intact, sensation intact, CN2-12 intact, no asterixis  Psych: Alert to self and place, not time    Labs and Imaging: CBC BMET   Recent Labs Lab 12/10/15 1736  WBC 9.0  HGB 13.6  HCT 39.2  PLT 200    Recent Labs Lab 12/10/15 1736  NA 143  K 4.0  CL 106  CO2 26  BUN 40*  CREATININE 3.22*  GLUCOSE 121*  CALCIUM 9.6     Dg Chest 2 View  Result Date: 12/10/2015 CLINICAL DATA:  Cough EXAM: CHEST  2 VIEW COMPARISON:  July 17, 2015 FINDINGS: There is no edema or consolidation. Heart is borderline enlarged with pulmonary vascularity within normal limits. No adenopathy. There is atherosclerotic calcification aorta. Bones are osteoporotic. IMPRESSION: No edema or consolidation. Heart borderline enlarged. Aortic atherosclerosis. Electronically Signed   By: WLowella GripIII M.D.   On: 12/10/2015 16:40    Jamarious Febo ZCletis Media MD 12/10/2015, 6:45 PM PGY-2, [place] pager: [phone] text pages welcome

## 2015-12-10 NOTE — ED Triage Notes (Signed)
Pt arrived by [facility] from home, reports having chronic pain to his legs but more severe in left leg and increase in swelling.

## 2015-12-10 NOTE — ED Notes (Signed)
Patient taken to XRAY

## 2015-12-10 NOTE — Progress Notes (Signed)
ANTICOAGULATION CONSULT NOTE - Initial Consult  Pharmacy Consult for warfarin Indication: DVT  No Known Allergies  Patient Measurements:     Vital Signs: Temp: 98 F (36.7 C) (11/18 1341) Temp Source: Oral (11/18 1341) BP: 199/90 (11/18 1845) Pulse Rate: 76 (11/18 1845)  Labs:  Recent Labs  12/10/15 1736 12/10/15 1900  HGB 13.6  --   HCT 39.2  --   PLT 200  --   LABPROT  --  35.4*  INR  --  3.43  CREATININE 3.22*  --     CrCl cannot be calculated (Unknown ideal weight.).   Medical History: Past Medical History:  Diagnosis Date  . Anginal pain (HCC)   . Anxiety   . Arthritis    "mostly in my knees and shoulders"  . Asthma    "touch"  . Chronic abdominal pain   . Chronic back pain   . Collapsed lung    "for 14 years" right   . Complication of anesthesia   . Coronary artery disease   . Depression   . Diabetes mellitus   . DVT, lower extremity (HCC) 1980's   LLE  . Esophageal stricture   . GERD (gastroesophageal reflux disease)   . Glaucoma   . Gout    no problems in 2 years  . Hearing loss   . Hemorrhoids   . Hiatal hernia   . Hyperlipemia   . Hypertension   . Hypothyroidism   . Myocardial infarction 1987; 1989  . Paroxysmal atrial fibrillation (HCC)   . Pneumonia 1970's  . PONV (postoperative nausea and vomiting)   . Pulmonary embolism, bilateral (HCC) 2010  . Renal disorder ~ 03/2011   "wasn't functioning right"  . Schatzki's ring   . Stroke [facility](HCC)    "just a little one"    Assessment: 86 YOM on warfarin for history of DVTs here with left leg tenderness, venous stasis changes and tenderness.  Admission INR 3.43.  Home dose of warfarin 4mg  daily except 2mg  on Sundays- last dose was 11/16. Hgb 13.6, plts 200- no bleeding noted.  Goal of Therapy:  INR 2-3 Monitor platelets by anticoagulation protocol: Yes   Plan:  -hold warfarin tonight with elevated INR -daily INR and CBC -follow doppler results  Zondra Lawlor D. Roshawn Lacina, PharmD,  BCPS Clinical Pharmacist Pager: [phone] 12/10/2015 8:19 PM

## 2015-12-11 ENCOUNTER — Inpatient Hospital Stay (HOSPITAL_COMMUNITY): Payer: Medicare HMO

## 2015-12-11 DIAGNOSIS — N189 Chronic kidney disease, unspecified: Secondary | ICD-10-CM

## 2015-12-11 DIAGNOSIS — N179 Acute kidney failure, unspecified: Secondary | ICD-10-CM

## 2015-12-11 LAB — COMPREHENSIVE METABOLIC PANEL
ALBUMIN: 3 g/dL — AB (ref 3.5–5.0)
ALK PHOS: 76 U/L (ref 38–126)
ALT: 15 U/L — ABNORMAL LOW (ref 17–63)
ANION GAP: 11 (ref 5–15)
AST: 25 U/L (ref 15–41)
BILIRUBIN TOTAL: 0.6 mg/dL (ref 0.3–1.2)
BUN: 33 mg/dL — ABNORMAL HIGH (ref 6–20)
CALCIUM: 9.2 mg/dL (ref 8.9–10.3)
CO2: 26 mmol/L (ref 22–32)
Chloride: 108 mmol/L (ref 101–111)
Creatinine, Ser: 2.55 mg/dL — ABNORMAL HIGH (ref 0.61–1.24)
GFR calc non Af Amer: 22 mL/min — ABNORMAL LOW (ref >60)
GFR, EST AFRICAN AMERICAN: 25 mL/min — AB (ref >60)
GLUCOSE: 117 mg/dL — AB (ref 65–99)
POTASSIUM: 4.1 mmol/L (ref 3.5–5.1)
SODIUM: 145 mmol/L (ref 135–145)
TOTAL PROTEIN: 8 g/dL (ref 6.5–8.1)

## 2015-12-11 LAB — CBC
HEMATOCRIT: 43.2 % (ref 39.0–52.0)
HEMOGLOBIN: 15 g/dL (ref 13.0–17.0)
MCH: 27.6 pg (ref 26.0–34.0)
MCHC: 34.7 g/dL (ref 30.0–36.0)
MCV: 79.4 fL (ref 78.0–100.0)
Platelets: 190 10*3/uL (ref 150–400)
RBC: 5.44 MIL/uL (ref 4.22–5.81)
RDW: 16.5 % — ABNORMAL HIGH (ref 11.5–15.5)
WBC: 8.7 10*3/uL (ref 4.0–10.5)

## 2015-12-11 LAB — TSH: TSH: 0.841 u[IU]/mL (ref 0.350–4.500)

## 2015-12-11 LAB — PROTIME-INR
INR: 3.54
PROTHROMBIN TIME: 36.3 s — AB (ref 11.4–15.2)

## 2015-12-11 LAB — BRAIN NATRIURETIC PEPTIDE: B Natriuretic Peptide: 298.4 pg/mL — ABNORMAL HIGH (ref 0.0–100.0)

## 2015-12-11 LAB — GLUCOSE, CAPILLARY
GLUCOSE-CAPILLARY: 126 mg/dL — AB (ref 65–99)
Glucose-Capillary: 155 mg/dL — ABNORMAL HIGH (ref 65–99)
Glucose-Capillary: 111 mg/dL — ABNORMAL HIGH (ref 65–99)
Glucose-Capillary: 143 mg/dL — ABNORMAL HIGH (ref 65–99)

## 2015-12-11 MED ORDER — CARVEDILOL 6.25 MG PO TABS
6.2500 mg | ORAL_TABLET | Freq: Two times a day (BID) | ORAL | Status: DC
  Administered 2015-12-11 – 2015-12-14 (×6): 6.25 mg via ORAL
  Filled 2015-12-11 (×6): qty 1

## 2015-12-11 MED ORDER — CARVEDILOL 3.125 MG PO TABS: 3.1250 mg | ORAL_TABLET | Freq: Two times a day (BID) | ORAL | Status: DC

## 2015-12-11 MED ORDER — ACETAMINOPHEN 325 MG PO TABS
650.0000 mg | ORAL_TABLET | Freq: Four times a day (QID) | ORAL | Status: DC | PRN
  Administered 2015-12-11 – 2015-12-13 (×5): 650 mg via ORAL
  Filled 2015-12-11 (×5): qty 2

## 2015-12-11 MED ORDER — DILTIAZEM HCL 30 MG PO TABS
30.0000 mg | ORAL_TABLET | Freq: Once | ORAL | Status: AC
  Administered 2015-12-11: 30 mg via ORAL
  Filled 2015-12-11: qty 1

## 2015-12-11 MED ORDER — CARVEDILOL 6.25 MG PO TABS: 6.2500 mg | ORAL_TABLET | Freq: Two times a day (BID) | ORAL | Status: DC

## 2015-12-11 MED ORDER — CEPHALEXIN 500 MG PO CAPS
500.0000 mg | ORAL_CAPSULE | Freq: Two times a day (BID) | ORAL | Status: DC
  Administered 2015-12-11 – 2015-12-14 (×7): 500 mg via ORAL
  Filled 2015-12-11 (×7): qty 1

## 2015-12-11 MED ORDER — CARVEDILOL 3.125 MG PO TABS
3.1250 mg | ORAL_TABLET | Freq: Once | ORAL | Status: AC
  Administered 2015-12-11: 3.125 mg via ORAL
  Filled 2015-12-11: qty 1

## 2015-12-11 NOTE — Progress Notes (Signed)
Notified Dr. Mosetta PuttFeng with Family Medicine of patient's heart rate increase to 160s and rhythm of Afib with PVCs; asymptomatic at this time. Patient has hx of Afib and is currently on Coumadin.

## 2015-12-11 NOTE — Progress Notes (Signed)
Pt c/o pain in feet and legs. This nurse messaged MD. Awaiting response. Will continue to monitor.   Larey Dayshristy M Laura Caldas, RN

## 2015-12-11 NOTE — Progress Notes (Signed)
PT Cancellation Note  Patient Details Name: [name] MRN: [id number] DOB: 1929-06-10   Cancelled Treatment:    Reason Eval/Treat Not Completed: Patient not medically ready -- noting multiple medical issues following admission (elevated INR, DVT, afib/RVR), will defer mobility evaluation today and proceed tomorrow if indicated.  Thank you,   Dennis BastMartin, [name] 12/11/2015, 1:15 PM

## 2015-12-11 NOTE — Progress Notes (Signed)
New Admission Note:  Arrival Method: By bed from the ED around 2030 Mental Orientation: PT alert and oriented Telemetry: On tele box 29 running ST Assessment: Completed. Unable to weigh pt d/t scale on bed not working and pt can not stand on feet d/t pain.  Skin: Dry, flaky overall. Small, red, open wound on left buttocks measuring 1 cm by .5 cm. Area was cleansed. Could not apply dressing d/t incontinence. Iv: Right AC bolus of NS was running Pain: Stated feet were hurting at a 4 when touching and moving them.  Tubes: Condom cath initiated d/t frequent incontinent episodes. Safety Measures: Safety Fall Prevention Plan was given and discussed. Pt is a high fall risk. Yellow socks, arm band, and bed alarm on.  Admission: Completed [address] [place] Orientation: Patient has been orientated to the room, unit and the staff. Family: None present  Orders have been reviewed and implemented. Will continue to monitor the patient. Call light has been placed within reach and bed alarm has been activated.    Alfonse Rashristy Cai Anfinson, RN  Phone Number: [phone]

## 2015-12-11 NOTE — Progress Notes (Signed)
ANTICOAGULATION CONSULT NOTE  Pharmacy Consult for warfarin Indication: DVT  No Known Allergies  Patient Measurements: Height: 5\' 8"  (172.7 cm) IBW/kg (Calculated) : 68.4  Vital Signs: Temp: 98.9 F (37.2 C) (11/19 1100) Temp Source: Oral (11/19 1100) BP: 153/86 (11/19 1100) Pulse Rate: 62 (11/19 1100)  Labs:  Recent Labs  12/10/15 1736 12/10/15 1900 12/11/15 0509  HGB 13.6  --  15.0  HCT 39.2  --  43.2  PLT 200  --  190  LABPROT  --  35.4* 36.3*  INR  --  3.43 3.54  CREATININE 3.22*  --  2.55*    CrCl cannot be calculated (Unknown ideal weight.).  Assessment: 86 YOM on warfarin for history of DVTs here with left leg tenderness, venous stasis changes and tenderness.  Admission INR 3.43. Home dose of warfarin 4mg  daily except 2mg  on Sundays- last dose was 11/16.  INR remains elevated today at 3.54.  Hgb 15, plts 190- no bleeding noted.  Goal of Therapy:  INR 2-3 Monitor platelets by anticoagulation protocol: Yes   Plan:  -hold warfarin tonight with elevated INR -daily INR and CBC  Errin Chewning D. Sharell Hilmer, PharmD, BCPS Clinical Pharmacist Pager: [phone] 12/11/2015 12:23 PM

## 2015-12-11 NOTE — Progress Notes (Signed)
Family Medicine Teaching Service Daily Progress Note Intern Pager: [phone]  Patient name: [name] Medical record number: [id number] Date of birth: Jan 24, 1929 Age: 82 y.o. Gender: male  Primary Care Provider: Leamon Arnt, MD Consultants: None Code Status: Full  Pt Overview and Major Events to Date:  11/18 - Admitted with AKI and UTI  Assessment and Plan: [name] is a 82 y.o. male presenting with dsyuria and milky colored urine. PMH is significant for T2DM, HTN, Esophageal stricture, Dementia, CKD stage 3, BPH, CAD,  PAF, Hx of DVTs( 1980s), hx of stroke  E coli UTI. Urine culture from [facility] grew pan sensitive e coli on 11.15. Patient started on augmentin on 11/17, though he has not taken. Given ceftriaxone in the ED. - Admit to inpatient service MedSurg, Dr. Erin Hearing -Urine culture pending -CTX (11/18 - 11/19) -Keflex 566m bid (11/19- )  AoCKD stage 3, Improving: Cr 3.2 on admission. Likely prerenal in setting od decreased PO intake. UTI also possibly contributing. - Will continue to follow BMET  - NS 100 ml per hr  - Hold home lasix  Bilateral leg edema, stable: Likely venous insuffiencey. Does have a history of DVT, though less likely given INR of 3.43. - Holding home lasix in setting of AKI - Elevate legs - LE dopplers ordered.   Elevated Total Protein 8.6. Given worsening renal function could consider.and Hx of fatigue per patient, could consider Multiple Myeloma, though calcium not elevated.  - Would consider obtain SPEP and UPEP as outpatient.   HFpEF/CAD: Last echo with ejection fraction of 50-55% (2015). Asymmetric hypertrophy, GDD1. History of MI x2.  -  Coreg 3.125 BID  - Hold home Lasix  PAF. CHADSVASC 8. On warfarin chronically. Elevated HRs this morning (120s-130s) - Warfarin per pharmacy - Amiodarone - Increase coreg to 6.25bid - Start PO diltiazem if not responding to above - Consider diltiazem drip if not adequately controlled with PO  meds or starts to develop symptoms  History of DVT.  - Anticoagulated as above  Dementia - Per chart review noted however no formula evaluation has been done. Currently lives by himself. Alert and orientated self, place, and year. Did not know who the current or past president was.   - Possibly on Depakote for Dementia, patient not sure  - Case management and social work  - PT and OT consult   Hypothyroidism:  -  Continue Synthroid  - Check TSH  T2DM. A1C 6.3 09/16/2015.  - Not currently on any medication per chart review  -  Sensitive SSI  HTN: Blood pressure elevated on admission  - IMDUR 60 mg, Coreg 3.125 BID,  -  Hydralazine PRN for elevated blood pressures 180/110  FEN/GI: PPI Prophylaxis: Wafarin   Disposition: Admitted pending above management.   Subjective:  Feeling much better this morning. No palpitations. No shortness of breath. Appetite improved.   Objective: Temp:  [98 F (36.7 C)-98.5 F (36.9 C)] 98.5 F (36.9 C) (11/19 0700) Pulse Rate:  [64-80] 64 (11/19 0700) Resp:  [16-20] 16 (11/19 0700) BP: (158-199)/(66-90) 189/76 (11/19 0700) SpO2:  [96 %-100 %] 99 % (11/19 0700) Physical Exam: General: 826yoman in NAD resting in hospital bed Cardiovascular: tachycardic. Irregular. No murmurs Respiratory: NWOB. CTAB Abdomen: +BS, S, ND. Mild tenderness in RLQ Extremities: 2+ pitting edema in with skin changes consistent with venous stasis.  Neuro: Alert and oriented to person, place, year, and month.  Laboratory:  Recent Labs Lab 12/10/15 1736 12/11/15 0509  WBC 9.0 8.7  HGB 13.6 15.0  HCT 39.2 43.2  PLT 200 190    Recent Labs Lab 12/10/15 1736 12/11/15 0509  NA 143 145  K 4.0 4.1  CL 106 108  CO2 26 26  BUN 40* 33*  CREATININE 3.22* 2.55*  CALCIUM 9.6 9.2  PROT 8.6* 8.0  BILITOT 0.7 0.6  ALKPHOS 83 76  ALT 15* 15*  AST 22 25  GLUCOSE 121* 117*    Recent Labs Lab 12/10/15 1900 12/11/15 0509  INR 3.43 3.54    Imaging/Diagnostic Tests: None new.   Blake Barrack, MD 12/11/2015, 9:18 AM PGY-3, [place] Intern pager: [phone], text pages welcome

## 2015-12-12 ENCOUNTER — Encounter (HOSPITAL_COMMUNITY): Payer: Medicare HMO

## 2015-12-12 DIAGNOSIS — M79605 Pain in left leg: Secondary | ICD-10-CM

## 2015-12-12 DIAGNOSIS — N3000 Acute cystitis without hematuria: Secondary | ICD-10-CM

## 2015-12-12 DIAGNOSIS — M79604 Pain in right leg: Secondary | ICD-10-CM

## 2015-12-12 LAB — BASIC METABOLIC PANEL
Anion gap: 9 (ref 5–15)
BUN: 30 mg/dL — ABNORMAL HIGH (ref 6–20)
CALCIUM: 8.7 mg/dL — AB (ref 8.9–10.3)
CHLORIDE: 111 mmol/L (ref 101–111)
CO2: 25 mmol/L (ref 22–32)
CREATININE: 2.29 mg/dL — AB (ref 0.61–1.24)
GFR calc non Af Amer: 25 mL/min — ABNORMAL LOW (ref >60)
GFR, EST AFRICAN AMERICAN: 28 mL/min — AB (ref >60)
Glucose, Bld: 112 mg/dL — ABNORMAL HIGH (ref 65–99)
Potassium: 4.2 mmol/L (ref 3.5–5.1)
SODIUM: 145 mmol/L (ref 135–145)

## 2015-12-12 LAB — GLUCOSE, CAPILLARY
GLUCOSE-CAPILLARY: 111 mg/dL — AB (ref 65–99)
GLUCOSE-CAPILLARY: 181 mg/dL — AB (ref 65–99)
GLUCOSE-CAPILLARY: 148 mg/dL — AB (ref 65–99)
Glucose-Capillary: 157 mg/dL — ABNORMAL HIGH (ref 65–99)

## 2015-12-12 LAB — PROTIME-INR
INR: 2.9
PROTHROMBIN TIME: 30.9 s — AB (ref 11.4–15.2)

## 2015-12-12 MED ORDER — WARFARIN SODIUM 3 MG PO TABS
3.0000 mg | ORAL_TABLET | Freq: Once | ORAL | Status: AC
  Administered 2015-12-12: 3 mg via ORAL
  Filled 2015-12-12: qty 1

## 2015-12-12 MED ORDER — SODIUM CHLORIDE 0.9 % IV SOLN
INTRAVENOUS | Status: DC
  Administered 2015-12-12: 12:00:00 via INTRAVENOUS

## 2015-12-12 NOTE — Clinical Social Work Note (Deleted)
CSW provided patient with bed offers. Patient agreeable to [facility] [place] at [place]. Patient is requesting a note from the MD explaining the need for the wheelchair in order for his apartment to get him a wheelchair ramp. CSW will continue to facilitate SNF placement.  [address][name], [place] [phone]

## 2015-12-12 NOTE — Evaluation (Signed)
Physical Therapy Evaluation Patient Details Name: [name] MRN: [id number] DOB: [birth year] Today's Date: 12/12/2015   History of Present Illness  82 yo male with onset of AKI and UTI was admitted.  He Lives alone, has 3 days with 3 hours of help a week.  PMHx:  DM, CKD 3, CAD, PAF, DVT, stroke  Clinical Impression  Pt is up to transfer to chair but is in such pain with RLE that he is unable to stand on it.  Has been complaining about this since admission and will anticipate he needs some modality intervention along with strengthening to ease the symptoms.  Follow acutely to deal with his mobility and to try to hasten his transition home.    Follow Up Recommendations SNF    Equipment Recommendations  Rolling walker with 5" wheels (if he does not have adequate walker)    Recommendations for Other Services       Precautions / Restrictions Precautions Precautions: Fall (telemetry) Precaution Comments: Cannot stand on RLE due to pain Restrictions Weight Bearing Restrictions: No      Mobility  Bed Mobility Overal bed mobility: Needs Assistance Bed Mobility: Supine to Sit     Supine to sit: Mod assist     General bed mobility comments: cues for sequencing and assist to pull wtih bed pad to side of bed on R hip  Transfers Overall transfer level: Needs assistance Equipment used: Rolling walker (2 wheeled);1 person hand held assist Transfers: Sit to/from [employer] CorporationStand;Stand Pivot Transfers Sit to Stand: Max assist;+2 physical assistance;+2 safety/equipment;From elevated surface Stand pivot transfers: +2 safety/equipment;+2 physical assistance;From elevated surface;Mod assist       General transfer comment: used drop arm chair due to his inability to stand up and use just LLE from pain in RLE  Ambulation/Gait             General Gait Details: unable  Stairs            Wheelchair Mobility    Modified Rankin (Stroke Patients Only)       Balance Overall balance  assessment: Needs assistance Sitting-balance support: Feet supported Sitting balance-Leahy Scale: Fair     Standing balance support: Bilateral upper extremity supported Standing balance-Leahy Scale: Poor Standing balance comment: mainly due to being unable to tolerate wgt on RLE                             Pertinent Vitals/Pain Pain Assessment: Faces Faces Pain Scale: Hurts whole lot Pain Location: R leg with WBing activity Pain Descriptors / Indicators: Aching;Sore Pain Intervention(s): Limited activity within patient's tolerance;Monitored during session;Premedicated before session;Repositioned    Home Living Family/patient expects to be discharged to:: Unsure                 Additional Comments: no family to ask    Prior Function Level of Independence: Needs assistance   Gait / Transfers Assistance Needed: used RW vs quad cane  ADL's / Homemaking Assistance Needed: has some help 3x per week for several hours per pt        Hand Dominance        Extremity/Trunk Assessment   Upper Extremity Assessment: Generalized weakness           Lower Extremity Assessment: Generalized weakness      Cervical / Trunk Assessment: Normal  Communication   Communication: No difficulties;Other (comment) (visually impaired R eye)  Cognition Arousal/Alertness: Awake/alert Behavior During Therapy: Flat  affect Overall Cognitive Status: History of cognitive impairments - at baseline       Memory: Decreased recall of precautions;Decreased short-term memory              General Comments General comments (skin integrity, edema, etc.): Pt is able to slide pivot to the chair but not able to walk sufficiently nor transfer with enough independence to transition home well.  Will ask for SNF care for short term to increase his safety and independence wtih all gait and transfers, to avoid fall risk and FTT.    Exercises     Assessment/Plan    PT Assessment  Patient needs continued PT services  PT Problem List Decreased strength;Decreased range of motion;Decreased activity tolerance;Decreased balance;Decreased mobility;Decreased coordination;Decreased cognition;Decreased knowledge of use of DME;Decreased safety awareness;Decreased knowledge of precautions;Pain          PT Treatment Interventions DME instruction;Gait training;Functional mobility training;Therapeutic activities;Therapeutic exercise;Balance training;Neuromuscular re-education;Patient/family education    PT Goals (Current goals can be found in the Care Plan section)  Acute Rehab PT Goals Patient Stated Goal: to keep R leg from hurting  PT Goal Formulation: With patient Time For Goal Achievement: 12/26/15 Potential to Achieve Goals: Good    Frequency Min 2X/week   Barriers to discharge Decreased caregiver support needs continual help for transfers and gait    Co-evaluation               End of Session Equipment Utilized During Treatment: Gait belt Activity Tolerance: Patient tolerated treatment well;Patient limited by pain Patient left: in chair;with call bell/phone within reach;with chair alarm set Nurse Communication: Mobility status         Time: [id number] PT Time Calculation (min) (ACUTE ONLY): 35 min   Charges:   PT Evaluation $PT Eval Moderate Complexity: 1 Procedure PT Treatments $Therapeutic Activity: 8-22 mins   PT G Codes:        Ivar DrapeStout, Elias Bordner E 12/12/2015, 1:06 PM    Samul Dadauth Lundyn Coste, PT MS Acute Rehab Dept. Number: [facility] [phone] and [facility] [phone]

## 2015-12-12 NOTE — Progress Notes (Signed)
Family Medicine Teaching Service Daily Progress Note Intern Pager: [phone]  Patient name: [name] Medical record number: [id number] Date of birth: 04-May-1929 Age: 82 y.o. Gender: male  Primary Care Provider: Leamon Arnt, MD Consultants: None Code Status: Full  Pt Overview and Major Events to Date:  11/18 - Admitted with AKI and UTI  Assessment and Plan: [name] is a 82 y.o. male presenting with dsyuria and milky colored urine. PMH is significant for T2DM, HTN, Esophageal stricture, Dementia, CKD stage 3, BPH, CAD,  PAF, Hx of DVTs( 1980s), hx of stroke  E coli UTI. Urine culture from [facility] grew pan sensitive e coli on 11.15. Patient started on augmentin on 11/17, though he has not taken. Given ceftriaxone in the ED. Patient has been afebrile and feels improved. -Urine culture pending -CTX (11/18 - 11/19) -Keflex 518m bid (11/19- ) will continue at time of discharge  AoCKD stage 3, Improving: Cr 3.2 on admission. Likely prerenal in setting od decreased PO intake. UTI also possibly contributing. Creatinine 2.29 today -Will continue to follow BMET  -adding back gentle fluids this morning -Hold home lasix  Bilateral leg edema, stable: Likely venous insuffiencey. Does have a history of DVT, though less likely given supra therapeutic INR -Holding home lasix in setting of AKI -Elevate legs  Elevated Total Protein 8.6. Given worsening renal function could consider.and Hx of fatigue per patient, could consider Multiple Myeloma, though calcium not elevated.  -Would consider obtain SPEP and UPEP as outpatient.   HFpEF/CAD: Last echo with ejection fraction of 50-55% (2015). Asymmetric hypertrophy, GDD1. History of MI x2.  -increased Coreg to 6.25 BID  -Hold home Lasix  PAF. CHADSVASC 8. On warfarin chronically. Elevated HRs (120s-130s) 11/19, received diltiazem PO with return to normal HR -Warfarin per pharmacy, patient would benefit from switch to  DOAC -Amiodarone -Increase coreg to 6.25bid -PO diltiazem if not responding to above  History of DVT.  - Anticoagulated as above  Dementia - Per chart review noted however no formula evaluation has been done. Currently lives by himself. Alert and orientated self, place, and year. Did not know who the current or past president was.   -Possibly on Depakote for Dementia, patient not sure  -Case management and social work  -PT and OT consults pending  Hypothyroidism:  -Continue Synthroid  -Check TSH  T2DM. A1C 6.3 09/16/2015.  -Not currently on any medication per chart review  -Sensitive SSI  HTN: Blood pressure elevated on admission  -IMDUR 60 mg, Coreg 3.125 BID,  -Hydralazine PRN for elevated blood pressures 180/110  FEN/GI: PPI Prophylaxis: Wafarin   Disposition: Pending PT/OT evaluation  Subjective:  [name] feeling well today. He is asking to have his mouth cleaned out before he eats breakfast. Denies fevers, chills, dysuria. States he cannot get up and walk around by himself, but states he lives alone and is independent except for someone who comes by to help with his medications.  Objective: Temp:  [98.5 F (36.9 C)-99.1 F (37.3 C)] 98.5 F (36.9 C) (11/20 0455) Pulse Rate:  [62-74] 68 (11/20 0455) Resp:  [16-18] 18 (11/20 0455) BP: (133-153)/(56-86) 144/56 (11/20 0455) SpO2:  [98 %-100 %] 100 % (11/20 0455) Physical Exam: General: elderly male, frail appearing, laying comfortably in bed in NAD Cardiovascular: regular rate and rhythm no MRG Respiratory: NWOB. CTAB Abdomen: soft, non-distended, non-tender, +BS Extremities: 2+ pitting edema in lower extremities bilaterally with skin changes consistent with venous stasis.  Neuro: Alert and oriented to person, place, year,  and month.  Laboratory:  Recent Labs Lab 12/10/15 1736 12/11/15 0509  WBC 9.0 8.7  HGB 13.6 15.0  HCT 39.2 43.2  PLT 200 190    Recent Labs Lab 12/10/15 1736 12/11/15 0509  12/12/15 0547  NA 143 145 145  K 4.0 4.1 4.2  CL 106 108 111  CO2 _0 BUN 40* 33* 30*  CREATININE 3.22* 2.55* 2.29*  CALCIUM 9.6 9.2 8.7*  PROT 8.6* 8.0  --   BILITOT 0.7 0.6  --   ALKPHOS 83 76  --   ALT 15* 15*  --   AST 22 25  --   GLUCOSE 121* 117* 112*    Recent Labs Lab 12/10/15 1900 12/11/15 0509 12/12/15 0547  INR 3.43 3.54 2.90   Imaging/Diagnostic Tests: None new.   Steve Rattler, DO 12/12/2015, 7:12 AM PGY-1, [place] Intern pager: [phone], text pages welcome

## 2015-12-12 NOTE — Discharge Summary (Signed)
[facility]  Patient name: [name] Medical record number: [id number] Date of birth: Mar 10, 1929 Age: 82 y.o. Gender: male Date of Admission: 12/10/2015  Date of Discharge: 12/13/15 Admitting Physician: Carney LivingMarshall L Chambliss, MD  Primary Care Provider: Willow OraANDY,CAMILLE L, MD Consultants: PT/OT  Indication for Hospitalization: UTI  Discharge Diagnoses/Problem List:  Patient Active Problem List   Diagnosis Date Noted  . Acute cystitis without hematuria   . Pain in both lower extremities   . Acute renal failure superimposed on chronic kidney disease (HCC)   . UTI (urinary tract infection) 12/10/2015  . Chronic RLQ pain 09/10/2014  . Chest pain 01/11/2014  . Chest pain at rest 01/08/2014  . Unstable angina (HCC) 07/15/2013  . Abdominal pain, unspecified site 06/02/2012  . Schatzki's ring - nonobstructive 05/31/2012  . Dementia, senile 05/20/2012  . Constipation, chronic 01/28/2012  . Esophageal stricture 01/28/2012  . Paraesophageal sliding hiatal hernia 01/28/2012  . Anticoagulated on warfarin 01/28/2012  . DJD (degenerative joint disease) of knee 09/19/2011  . GERD (gastroesophageal reflux disease) 09/19/2011  . CKD (chronic kidney disease) stage 3, GFR 30-59 ml/min 08/08/2011  . BPH (benign prostatic hyperplasia) 08/08/2011  . Type II or unspecified type diabetes mellitus with renal manifestations, uncontrolled(250.42) 03/21/2006  . HYPERTENSION, BENIGN SYSTEMIC 03/21/2006  . CLAUDICATION, INTERMITTENT 03/21/2006  . RHINITIS, ALLERGIC 03/21/2006   Disposition: SNF  Discharge Condition: stable, improved  Discharge Exam:  General: elderly male, frail appearing, laying comfortably in bed in NAD Cardiovascular: regular rate and rhythm no MRG Respiratory: NWOB. CTAB Abdomen: soft, non-distended, non-tender, +BS Extremities: 1+ pitting edema in lower extremities bilaterally with skin changes consistent with venous stasis.   Neuro: Alert and oriented to person, place, year, and month.  Brief Hospital Course:   [name] is an 82 year old with a PMH of atrial fibrillation, dementia, T2DM, esophageal stricture, CKD stage 3, BPH, CAD, and history of DVT (1980's) who was admitted on 11/18 due to UTI and resulting dysuria, nausea, and suprapubic pain. He received two doses of IV ceftriaxone before being transitioned to oral Keflex. His urine culture grew pan-sensitive e-coli. His symptoms of dysuria improved with antibiotics and he remained afebrile during his hospital stay. He was found to have an acute worsening of his CKD from his baseline creatinine of 1.8-2.0 to 3.22 on admission. He was rehydrated with gentle fluids during his hospital stay and his creatinine trended down to 2.03 at time of discharge. Patient has history of atrial fibrillation controlled with amiodarone and coreg. He did have an episode of RVR that responded to a one time dose of diltiazem. He is anticoagulated with warfarin and was noted to be supratherapeutic in his INR at time of admission. Pharmacy dosed his warfarin and recommended discharge on 3mg  daily. PT/OT evaluated patient and recommended SNF placement. He was amenable to this and discharged to SNF on 12/13/15 in stable condition with plan to complete a 7 day course of antibiotics for his UTI.    Issues for Follow Up:  1. Recommend checking INR by Friday 11/24 to assess how patient is doing on 3mg  daily of Warfarin (decreased from home dose of 4mg  daily) 2. Complete course of Keflex for UTI 3. Recommend outpatient SPEP/UPEP 4. Recommend elevating legs and using compression stockings for chronic venous stasis  Significant Procedures: none  Significant Labs and Imaging:   Recent Labs Lab 12/10/15 1736 12/11/15 0509  WBC 9.0 8.7  HGB 13.6 15.0  HCT 39.2 43.2  PLT 200 190    Recent Labs Lab 12/10/15 1736 12/11/15 0509 12/12/15 0547 12/13/15 0622  NA 143 145 145 143  K  4.0 4.1 4.2 3.8  CL 106 108 111 108  CO2 26 26 25 26   GLUCOSE 121* 117* 112* 118*  BUN 40* 33* 30* 30*  CREATININE 3.22* 2.55* 2.29* 2.03*  CALCIUM 9.6 9.2 8.7* 8.7*  ALKPHOS 83 76  --   --   AST 22 25  --   --   ALT 15* 15*  --   --   ALBUMIN 3.2* 3.0*  --   --     Results/Tests Pending at Time of Discharge: none  Discharge Medications:    Medication List    STOP taking these medications   erythromycin ophthalmic ointment     TAKE these medications   acetaminophen 500 MG tablet Commonly known as:  TYLENOL Take 500-1,000 mg by mouth every 6 (six) hours as needed for headache (pain).   amiodarone 200 MG tablet Commonly known as:  PACERONE Take 200 mg by mouth daily.   carvedilol 3.125 MG tablet Commonly known as:  COREG Take 2 tablets (6.25 mg total) by mouth 2 (two) times daily with a meal. 8am, 6pm What changed:  how much to take   cephALEXin 500 MG capsule Commonly known as:  KEFLEX Take 1 capsule (500 mg total) by mouth every 12 (twelve) hours. Due for next dose on 11/21 at 10pm   divalproex 125 MG DR tablet Commonly known as:  DEPAKOTE Take 125 mg by mouth 2 (two) times daily.   furosemide 40 MG tablet Commonly known as:  LASIX Take 80 mg by mouth 2 (two) times daily.   isosorbide mononitrate 60 MG 24 hr tablet Commonly known as:  IMDUR Take 60 mg by mouth daily.   latanoprost 0.005 % ophthalmic solution Commonly known as:  XALATAN Place 1 drop into both eyes at bedtime.   levothyroxine 75 MCG tablet Commonly known as:  SYNTHROID, LEVOTHROID Take 75 mcg by mouth daily before breakfast.   nitroGLYCERIN 0.4 MG SL tablet Commonly known as:  NITROSTAT Place 1 tablet (0.4 mg total) under the tongue every 5 (five) minutes as needed for chest pain.   omeprazole 20 MG capsule Commonly known as:  PRILOSEC Take 20 mg by mouth daily.   ondansetron 4 MG disintegrating tablet Commonly known as:  ZOFRAN ODT 4mg  ODT q4 hours prn nausea/vomit   tamsulosin  0.4 MG Caps capsule Commonly known as:  FLOMAX Take 0.8 mg by mouth at bedtime.   warfarin 3 MG tablet Commonly known as:  COUMADIN Take 1 tablet (3 mg total) by mouth daily. What changed:  medication strength  how much to take  when to take this  additional instructions       Discharge Instructions: Please refer to Patient Instructions section of EMR for full details.  Patient was counseled important signs and symptoms that should prompt return to medical care, changes in medications, dietary instructions, activity restrictions, and follow up appointments.   Follow-Up Appointments:   Patient D/C to SNF, follow up with PCP after discharge  Tillman SersAngela C Riccio, DO 12/13/2015, 9:49 AM PGY-1, [facility]

## 2015-12-12 NOTE — [place] FL2 (Signed)
[place] MEDICAID FL2 LEVEL OF CARE SCREENING TOOL     IDENTIFICATION  Patient Name: Isaac LaudJames Whiston Birthdate: 06/28/1929 Sex: male Admission Date (Current Location): 12/10/2015  [facility] [place]   Facility and Address:  [facility] [place], [address], [place], [place] [address]      Provider Number: [id number]  Attending Physician Name and Address:  Carney LivingMarshall L Chambliss, MD  Relative Name and Phone Number:       Current Level of Care: Hospital Recommended Level of Care: Skilled Nursing Facility Prior Approval Number:    Date Approved/Denied: 12/12/15 PASRR Number: [id number] [id number]  Discharge Plan: SNF    Current Diagnoses: Patient Active Problem List   Diagnosis Date Noted  . Acute cystitis without hematuria   . Pain in both lower extremities   . Acute renal failure superimposed on chronic kidney disease (HCC)   . UTI (urinary tract infection) 12/10/2015  . Chronic RLQ pain 09/10/2014  . Chest pain 01/11/2014  . Chest pain at rest 01/08/2014  . Unstable angina (HCC) 07/15/2013  . Abdominal pain, unspecified site 06/02/2012  . Schatzki's ring - nonobstructive 05/31/2012  . Dementia, senile 05/20/2012  . Constipation, chronic 01/28/2012  . Esophageal stricture 01/28/2012  . Paraesophageal sliding hiatal hernia 01/28/2012  . Anticoagulated on warfarin 01/28/2012  . DJD (degenerative joint disease) of knee 09/19/2011  . GERD (gastroesophageal reflux disease) 09/19/2011  . CKD (chronic kidney disease) stage 3, GFR 30-59 ml/min 08/08/2011  . BPH (benign prostatic hyperplasia) 08/08/2011  . Type II or unspecified type diabetes mellitus with renal manifestations, uncontrolled(250.42) 03/21/2006  . HYPERTENSION, BENIGN SYSTEMIC 03/21/2006  . CLAUDICATION, INTERMITTENT 03/21/2006  . RHINITIS, ALLERGIC 03/21/2006    Orientation RESPIRATION BLADDER Height & Weight     Time, Situation, Place, Self  Normal Incontinent Weight:   (Cannot stand, bedscale not working) Height:  5\' 8"  (172.7 cm)  BEHAVIORAL SYMPTOMS/MOOD NEUROLOGICAL BOWEL NUTRITION STATUS      Continent Diet (Heart healthy; thin liquids)  AMBULATORY STATUS COMMUNICATION OF NEEDS Skin   Extensive Assist Verbally Surgical wounds (Open wound/incision (left buttocks))                       Personal Care Assistance Level of Assistance  Bathing, Feeding, Dressing Bathing Assistance: Maximum assistance Feeding assistance: Independent Dressing Assistance: Maximum assistance     Functional Limitations Info  Sight, Hearing, Speech Sight Info: Adequate Hearing Info: Adequate Speech Info: Adequate    SPECIAL CARE FACTORS FREQUENCY  PT (By licensed PT), OT (By licensed OT)     PT Frequency: 3x week OT Frequency: 3x week            Contractures Contractures Info: Not present    Additional Factors Info  Code Status, Allergies Code Status Info: Full Allergies Info: No known allergies           Current Medications (12/12/2015):  This is the current hospital active medication list Current Facility-Administered Medications  Medication Dose Route Frequency Provider Last Rate Last Dose  . 0.9 %  sodium chloride infusion   Intravenous Continuous Tillman SersAngela C Riccio, DO 30 mL/hr at 12/12/15 1201    . acetaminophen (TYLENOL) tablet 650 mg  650 mg Oral Q6H PRN Howard PouchLauren Feng, MD   650 mg at 12/12/15 1006  . amiodarone (PACERONE) tablet 200 mg  200 mg Oral Daily Asiyah Mayra ReelZahra Mikell, MD   200 mg at 12/12/15 1006  . carvedilol (COREG) tablet 6.25 mg  6.25 mg Oral BID WC Ardith Darkaleb M Parker, MD   6.25 mg at 12/12/15 [id number]  . cephALEXin (KEFLEX) capsule 500 mg  500 mg Oral Q12H Ardith Darkaleb M Parker, MD   500 mg at 12/12/15 1007  . divalproex (DEPAKOTE) DR tablet 125 mg  125 mg Oral BID Asiyah Mayra ReelZahra Mikell, MD   125 mg at 12/12/15 1006  . dorzolamide-timolol (COSOPT) 22.3-6.8 MG/ML ophthalmic solution 1 drop  1 drop Right Eye BID Asiyah Mayra ReelZahra Mikell, MD   1 drop at  12/12/15 1007  . hydrALAZINE (APRESOLINE) injection 5 mg  5 mg Intravenous Q6H PRN Asiyah Mayra ReelZahra Mikell, MD   5 mg at 12/11/15 0711  . insulin aspart (novoLOG) injection 0-9 Units  0-9 Units Subcutaneous TID WC Asiyah Mayra ReelZahra Mikell, MD   2 Units at 12/12/15 1221  . isosorbide mononitrate (IMDUR) 24 hr tablet 60 mg  60 mg Oral Daily Asiyah Mayra ReelZahra Mikell, MD   60 mg at 12/12/15 1005  . latanoprost (XALATAN) 0.005 % ophthalmic solution 1 drop  1 drop Both Eyes QHS Asiyah Mayra ReelZahra Mikell, MD   1 drop at 12/11/15 2131  . levothyroxine (SYNTHROID, LEVOTHROID) tablet 75 mcg  75 mcg Oral QAC breakfast Asiyah Mayra ReelZahra Mikell, MD   75 mcg at 12/12/15 [phone]  . ondansetron (ZOFRAN) tablet 4 mg  4 mg Oral Q6H PRN Asiyah Mayra ReelZahra Mikell, MD       Or  . ondansetron (ZOFRAN) injection 4 mg  4 mg Intravenous Q6H PRN Asiyah Mayra ReelZahra Mikell, MD      . pantoprazole (PROTONIX) EC tablet 40 mg  40 mg Oral Daily Asiyah Mayra ReelZahra Mikell, MD   40 mg at 12/12/15 1006  . senna-docusate (Senokot-S) tablet 1 tablet  1 tablet Oral QHS PRN Asiyah Mayra ReelZahra Mikell, MD      . tamsulosin (FLOMAX) capsule 0.8 mg  0.8 mg Oral QPC supper Asiyah Mayra ReelZahra Mikell, MD   0.8 mg at 12/11/15 1746  . warfarin (COUMADIN) tablet 3 mg  3 mg Oral ONCE-1800 Ann HeldElizabeth J [name], [facility] GroupRPH      . Warfarin - Pharmacist Dosing Inpatient   Does not apply q1800 Sharlyne CaiLauren D Bajbus, RPH   Stopped at 12/10/15 2022     Discharge Medications: Please see discharge summary for a list of discharge medications.  Relevant Imaging Results:  Relevant Lab Results:   Additional Information SSN: [id number]  Reuel BoomBridget A Mayton, LCSW

## 2015-12-12 NOTE — Evaluation (Signed)
Occupational Therapy Evaluation Patient Details Name: [name] MRN: [id number] DOB: 08-01-1929 Today's Date: 12/12/2015    History of Present Illness 82 yo male with onset of AKI and UTI was admitted.  He Lives alone, has 3 days with 3 hours of help a week.  PMHx:  DM, CKD 3, CAD, PAF, DVT, stroke   Clinical Impression   Pt admitted with the above diagnosis and has the deficits listed below. Pt would benefit from cont OT to increase independence in basic adls and adl transfers so he can eventually return home with his caregiver that comes 3 days a week. At this time, pt is not safe to return home without more assist and would benefit from SNF rehab to reach his baseline.  Feel with pts ongoing cognitive deficits that this pt may need to consider different long term plans for DC.  Feel this pt may not be safe to live alone due to his poor memory and safety/judgement skills.  Will follow acutely.    Follow Up Recommendations  SNF;Supervision/Assistance - 24 hour    Equipment Recommendations  None recommended by OT    Recommendations for Other Services       Precautions / Restrictions Precautions Precautions: Fall Precaution Comments: Cannot stand on RLE due to pain Restrictions Weight Bearing Restrictions: No Other Position/Activity Restrictions: painful to weight bear on RLE      Mobility Bed Mobility Overal bed mobility: Needs Assistance Bed Mobility: Supine to Sit     Supine to sit: Mod assist     General bed mobility comments: cues for sequencing and assist to pull wtih bed pad to side of bed on R hip  Transfers Overall transfer level: Needs assistance Equipment used: Rolling walker (2 wheeled);1 person hand held assist Transfers: Sit to/from Stand Sit to Stand: Max assist;+2 physical assistance;+2 safety/equipment;From elevated surface Stand pivot transfers: +2 safety/equipment;+2 physical assistance;From elevated surface;Mod assist       General transfer  comment: used drop arm chair due to his inability to stand up and use just LLE from pain in RLE    Balance Overall balance assessment: Needs assistance Sitting-balance support: Feet supported Sitting balance-Leahy Scale: Fair     Standing balance support: Bilateral upper extremity supported;During functional activity Standing balance-Leahy Scale: Zero Standing balance comment: Pt unable to bear weight through RLE at this time.                            ADL Overall ADL's : Needs assistance/impaired Eating/Feeding: Set up;Sitting   Grooming: Wash/dry hands;Wash/dry face;Oral care;Set up;Sitting   Upper Body Bathing: Set up;Sitting   Lower Body Bathing: Maximal assistance;Sit to/from stand;Sitting/lateral leans Lower Body Bathing Details (indicate cue type and reason): pt unable to stand without +2 assist. Upper Body Dressing : Set up;Cueing for compensatory techniques;Sitting   Lower Body Dressing: Total assistance;Sit to/from stand;Cueing for compensatory techniques   Toilet Transfer: Maximal assistance;+2 for physical assistance;Squat-pivot;BSC;RW (would benefit from drop arm BSC) Toilet Transfer Details (indicate cue type and reason): Pt cannot bear weight through RLE right now. Pt can scoot laterally.  May benefit from drop arm BSC Toileting- Clothing Manipulation and Hygiene: Total assistance;+2 for physical assistance;Sitting/lateral lean       Functional mobility during ADLs: Maximal assistance;+2 for physical assistance General ADL Comments: Pt has poor short term memory repeating self several times during session therefore retaining new information and adl techniques will take some time.  Pt currently requiring  max assist for LE adls and will need SNF at d/c.     Vision     Perception     Praxis      Pertinent Vitals/Pain Pain Assessment: Faces Faces Pain Scale: Hurts whole lot Pain Location: R foot and abdomen Pain Descriptors / Indicators:  Aching Pain Intervention(s): Limited activity within patient's tolerance;Monitored during session;Repositioned     Hand Dominance Right (but was feeding self with L)   Extremity/Trunk Assessment Upper Extremity Assessment Upper Extremity Assessment: Generalized weakness   Lower Extremity Assessment Lower Extremity Assessment: Defer to PT evaluation   Cervical / Trunk Assessment Cervical / Trunk Assessment: Normal   Communication Communication Communication: HOH   Cognition Arousal/Alertness: Awake/alert Behavior During Therapy: Flat affect Overall Cognitive Status: History of cognitive impairments - at baseline       Memory: Decreased recall of precautions;Decreased short-term memory             General Comments       Exercises Exercises: Other exercises (Simultaneous filing. User may not have seen previous data.)     Shoulder Instructions      Home Living Family/patient expects to be discharged to:: Unsure                                 Additional Comments: no family to ask      Prior Functioning/Environment Level of Independence: Needs assistance  Gait / Transfers Assistance Needed: used RW vs quad cane ADL's / Homemaking Assistance Needed: has some help 3x per week for several hours per pt   Comments: Pt states when aid is not there he can dress himself if needed.        OT Problem List: Decreased strength;Decreased activity tolerance;Impaired balance (sitting and/or standing);Decreased cognition;Decreased safety awareness;Decreased knowledge of use of DME or AE;Decreased knowledge of precautions;Pain   OT Treatment/Interventions: Self-care/ADL training;DME and/or AE instruction;Therapeutic activities;Balance training    OT Goals(Current goals can be found in the care plan section) Acute Rehab OT Goals Patient Stated Goal: to keep R leg from hurting  OT Goal Formulation: With patient Time For Goal Achievement: 12/26/15 Potential to  Achieve Goals: Good ADL Goals Pt Will Perform Lower Body Bathing: with min assist;sit to/from stand Pt Will Perform Lower Body Dressing: with min assist;sit to/from stand Pt Will Transfer to Toilet: with min assist;ambulating;bedside commode Pt Will Perform Tub/Shower Transfer: Shower transfer;shower seat;ambulating;with min assist;rolling walker  OT Frequency: Min 2X/week   Barriers to D/C: Decreased caregiver support  pt needs 24/7 assist.       Co-evaluation              End of Session Nurse Communication: Mobility status;Other (comment) (Pt left on bed pan.  Nursing and tech aware.)  Activity Tolerance: Patient limited by pain Patient left: in chair;with call bell/phone within reach;with chair alarm set   Time: 1243-1317 OT Time Calculation (min): 34 min Charges:  OT General Charges $OT Visit: 1 Procedure OT Evaluation $OT Eval Moderate Complexity: 1 Procedure OT Treatments $Self Care/Home Management : 8-22 mins G-Codes:    [name], [name] 12/12/2015, 1:33 PM  [phone]

## 2015-12-12 NOTE — Progress Notes (Signed)
ANTICOAGULATION CONSULT NOTE  Pharmacy Consult for warfarin Indication: Hx Afib/DVT  No Known Allergies  Patient Measurements: Height: 5\' 8"  (172.7 cm) Weight:  (Cannot stand, bedscale not working) IBW/kg (Calculated) : 68.4  Vital Signs: Temp: 98.7 F (37.1 C) (11/20 1001) Temp Source: Oral (11/20 1001) BP: 150/54 (11/20 1001) Pulse Rate: 73 (11/20 1001)  Labs:  Recent Labs  12/10/15 1736 12/10/15 1900 12/11/15 0509 12/12/15 0547  HGB 13.6  --  15.0  --   HCT 39.2  --  43.2  --   PLT 200  --  190  --   LABPROT  --  35.4* 36.3* 30.9*  INR  --  3.43 3.54 2.90  CREATININE 3.22*  --  2.55* 2.29*    CrCl cannot be calculated (Unknown ideal weight.).  Assessment: 86 YOM resumed on warfarin from PTA for hx Afib/DVT. Admitted with INR of 3.43 on PTA dose.   INR today has trended down into the therapeutic range  (INR 2.9 << 3.54, goal of 2-3) after holding doses on 11/18-11/19. CBC wnl and stable. No overt s/sx of bleeding noted.   Goal of Therapy:  INR 2-3 Monitor platelets by anticoagulation protocol: Yes   Plan:  1. Warfarin 3 mg x 1 dose at 1800 today 2. Will continue to monitor for any signs/symptoms of bleeding and will follow up with PT/INR in the a.m.   Thank you for allowing pharmacy to be a part of this patient's care.  Georgina PillionElizabeth Garvis Downum, PharmD, BCPS Clinical Pharmacist Pager: [phone] Clinical phone for 12/12/2015 from 7a-3:30p: [phone] If after 3:30p, please call main pharmacy at: [phone] 12/12/2015 2:54 PM

## 2015-12-12 NOTE — Progress Notes (Signed)
I spoke with patient's son, Lionel, on the phone to get aGraciella Belton better indication of Mr. Lubertha SouthMcCray's home situation. Per patient's son, patient lives at home in a 1 bedroom building for elderly patients (it is not a SNF or ALF). He has a home health aide that comes by 2 times per week. His son also checks in on him twice per week. There is no HCPOA in the family, but his son is his closest relative and handles his finances. Patient's son has been working with a case worker to try to get him placed in an ALF, because he is concerned for patient's ability to care for himself at home. Patient's son is in agreement to him going to SNF and then hopefully to an ALF. PT and OT have evaluated patient and are recommending SNF. I have consulted social work for SNF placement.  Willadean CarolKaty Makenley Shimp, MD

## 2015-12-13 DIAGNOSIS — N390 Urinary tract infection, site not specified: Principal | ICD-10-CM

## 2015-12-13 DIAGNOSIS — E860 Dehydration: Secondary | ICD-10-CM

## 2015-12-13 LAB — URINE CULTURE
Culture: >=100000 — AB
Special Requests: NORMAL

## 2015-12-13 LAB — BASIC METABOLIC PANEL
Anion gap: 9 (ref 5–15)
BUN: 30 mg/dL — ABNORMAL HIGH (ref 6–20)
CALCIUM: 8.7 mg/dL — AB (ref 8.9–10.3)
CO2: 26 mmol/L (ref 22–32)
CREATININE: 2.03 mg/dL — AB (ref 0.61–1.24)
Chloride: 108 mmol/L (ref 101–111)
GFR calc Af Amer: 33 mL/min — ABNORMAL LOW (ref >60)
GFR, EST NON AFRICAN AMERICAN: 28 mL/min — AB (ref >60)
GLUCOSE: 118 mg/dL — AB (ref 65–99)
Potassium: 3.8 mmol/L (ref 3.5–5.1)
Sodium: 143 mmol/L (ref 135–145)

## 2015-12-13 LAB — PROTIME-INR
INR: 2.52
PROTHROMBIN TIME: 27.7 s — AB (ref 11.4–15.2)

## 2015-12-13 LAB — GLUCOSE, CAPILLARY
GLUCOSE-CAPILLARY: 149 mg/dL — AB (ref 65–99)
Glucose-Capillary: 157 mg/dL — ABNORMAL HIGH (ref 65–99)
Glucose-Capillary: 177 mg/dL — ABNORMAL HIGH (ref 65–99)
Glucose-Capillary: 132 mg/dL — ABNORMAL HIGH (ref 65–99)

## 2015-12-13 MED ORDER — CEPHALEXIN 500 MG PO CAPS: 500.0000 mg | ORAL_CAPSULE | Freq: Two times a day (BID) | ORAL | 0 refills | Status: AC

## 2015-12-13 MED ORDER — WARFARIN SODIUM 3 MG PO TABS
3.0000 mg | ORAL_TABLET | Freq: Once | ORAL | Status: AC
  Administered 2015-12-13: 3 mg via ORAL
  Filled 2015-12-13: qty 1

## 2015-12-13 MED ORDER — CARVEDILOL 3.125 MG PO TABS: 6.2500 mg | ORAL_TABLET | Freq: Two times a day (BID) | ORAL | 1 refills | Status: AC

## 2015-12-13 MED ORDER — WARFARIN SODIUM 3 MG PO TABS: 3.0000 mg | ORAL_TABLET | Freq: Every day | ORAL | 0 refills | Status: DC

## 2015-12-13 NOTE — Progress Notes (Signed)
ANTICOAGULATION CONSULT NOTE  Pharmacy Consult for warfarin Indication: Hx Afib/DVT  No Known Allergies  Patient Measurements: Height: 5\' 8"  (172.7 cm) Weight: 169 lb 8.5 oz (76.9 kg) IBW/kg (Calculated) : 68.4  Vital Signs: Temp: 98.9 F (37.2 C) (11/21 1004) Temp Source: Oral (11/21 1004) BP: 125/57 (11/21 1004) Pulse Rate: 74 (11/21 1004)  Labs:  Recent Labs  12/10/15 1736  12/11/15 0509 12/12/15 0547 12/13/15 0622  HGB 13.6  --  15.0  --   --   HCT 39.2  --  43.2  --   --   PLT 200  --  190  --   --   LABPROT  --   < > 36.3* 30.9* 27.7*  INR  --   < > 3.54 2.90 2.52  CREATININE 3.22*  --  2.55* 2.29* 2.03*  < > = values in this interval not displayed.  Estimated Creatinine Clearance: 26.2 mL/min (by C-G formula based on SCr of 2.03 mg/dL (H)).  Assessment: 86 YOM resumed on warfarin from PTA for hx Afib/DVT. Admitted with INR of 3.43 on PTA dose.   INR today remains therapeutic after restarting yesterday evening (INR 2.52 << 2.9, goal of 2-3). It is noted that doses were held on 11/18-11/19. CBC wnl and stable. No overt s/sx of bleeding noted.   Noted plans for discharge today - recommended 3 mg/day with a follow-up INR check by Friday, 11/24.   Goal of Therapy:  INR 2-3 Monitor platelets by anticoagulation protocol: Yes   Plan:  1. Warfarin 3 mg x 1 dose at 1800 today (if still here) 2. Will continue to monitor for any signs/symptoms of bleeding and will follow up with PT/INR in the a.m. (if still here)  Thank you for allowing pharmacy to be a part of this patient's care.  Georgina PillionElizabeth Moroni Nester, PharmD, BCPS Clinical Pharmacist Pager: [phone] Clinical phone for 12/13/2015 from 7a-3:30p: [phone] If after 3:30p, please call main pharmacy at: [phone] 12/13/2015 2:47 PM

## 2015-12-13 NOTE — Clinical Social Work Note (Signed)
Clinical Social Work Assessment  Patient Details  Name: [name] MRN: [id number] Date of Birth: 04/08/1929  Date of referral:  12/13/15               Reason for consult:  Facility Placement                Permission sought to share information with:  Family Supports Permission granted to share information::  Yes, Verbal Permission Granted  Name::     [name]  Agency::     Relationship::  Son  Contact Information:  [phone], [phone]  Housing/Transportation Living arrangements for the past 2 months:  Independent Living Facility Source of Information:  Patient Patient Interpreter Needed:  None Criminal Activity/Legal Involvement Pertinent to Current Situation/Hospitalization:  No - Comment as needed Significant Relationships:  Adult Children Lives with:  Self Do you feel safe going back to the place where you live?  Yes Need for family participation in patient care:  No (Coment)  Care giving concerns: No family or friends at bedside at this time.   Social Worker assessment / plan:  CSW spoke with pt at bedside. [place] from [place] independent living facility in [place]. At this time pt is agreeable to a SNF placement at discharge. Pt request a facility in [place] however, does not have one he prefers. CSW will present bed offers once available.   Employment status:  Retired Database administratornsurance information:  Managed Medicare PT Recommendations:  Skilled Nursing Facility Information / Referral to community resources:  Skilled Nursing Facility  Patient/Family's Response to care:  Pt verbalized understanding of CSW role and appreciation of support.   Patient/Family's Understanding of and Emotional Response to Diagnosis, Current Treatment, and Prognosis:  Pt understanding and realistic regarding physical limitations. Pt at this time agreeable to SNF placement at discharge.  Emotional Assessment Appearance:  Appears stated age Attitude/Demeanor/Rapport:   (Patient was  appropriate.) Affect (typically observed):  Accepting, Appropriate, Calm Orientation:  Oriented to Self, Oriented to Place, Oriented to  Time, Oriented to Situation Alcohol / Substance use:  Not Applicable Psych involvement (Current and /or in the community):  No (Comment)  Discharge Needs  Concerns to be addressed:  Care Coordination Readmission within the last 30 days:  No Current discharge risk:  Dependent with Mobility Barriers to Discharge:  Continued Medical Work up   [employer] IncBridget A Mayton, LCSW 12/13/2015, 9:54 AM

## 2015-12-13 NOTE — Progress Notes (Signed)
Family Medicine Teaching Service Daily Progress Note Intern Pager: [phone]  Patient name: [name] Medical record number: [id number] Date of birth: 1929/08/22 Age: 82 y.o. Gender: male  Primary Care Provider: Leamon Arnt, MD Consultants: None Code Status: Full  Pt Overview and Major Events to Date:  11/18 - Admitted with AKI and UTI  Assessment and Plan: [name] is a 82 y.o. male presenting with dsyuria and milky colored urine. PMH is significant for T2DM, HTN, Esophageal stricture, Dementia, CKD stage 3, BPH, CAD,  PAF, Hx of DVTs( 1980s), hx of stroke  E coli UTI. Urine culture from [facility] grew pan sensitive e coli on 11.15. Patient started on augmentin on 11/17, though he has not taken. Given ceftriaxone in the ED. Patient has been afebrile and feels improved. -Urine culture growing pan-sensitive e.coli -CTX (11/18 - 11/19) -Keflex 543m bid (11/19- ) will continue at time of discharge for total 7 day antibiotic course  AoCKD stage 3, Improving: Cr 3.2 on admission. Likely prerenal in setting od decreased PO intake. UTI also possibly contributing. Creatinine 2.20 today  -discontinue fluids -Hold home lasix, will discharge to SNF on home dose  Bilateral leg edema, stable: Likely venous insuffiencey. Does have a history of DVT, though less likely given supra therapeutic INR -Elevate legs  Elevated Total Protein 8.6. Given worsening renal function could consider.and Hx of fatigue per patient, could consider Multiple Myeloma, though calcium not elevated.  -Would consider obtain SPEP and UPEP as outpatient.   HFpEF/CAD: Last echo with ejection fraction of 50-55% (2015). Asymmetric hypertrophy, GDD1. History of MI x2.  -increased Coreg to 6.25 BID  -Hold home Lasix  PAF. CHADSVASC 8. On warfarin chronically. Elevated HRs (120s-130s) 11/19, received diltiazem PO with return to normal HR -Warfarin per pharmacy -Amiodarone -Continue coreg 6.25bid  History of DVT.   - Anticoagulated as above  Dementia - Per chart review noted however no formula evaluation has been done. Currently lives by himself. Alert and orientated self, place, and year. Did not know who the current or past president was.   -Possibly on Depakote for Dementia, patient not sure  -Case management and social work  -PT/OT recommending SNF placement  Hypothyroidism:  -Continue Synthroid at home dosage -TSH 0.841  T2DM. A1C 6.3 09/16/2015.  -Not currently on any medication per chart review  -Sensitive SSI  HTN: Blood pressure elevated on admission  -IMDUR 60 mg, Coreg 3.125 BID,  -Hydralazine PRN for elevated blood pressures 180/110  FEN/GI: PPI Prophylaxis: Wafarin   Disposition: SNF  Subjective:  [name] feeling well today, he is eating breakfast, no complaints currently. He is amenable to going to a SNF.  Objective: Temp:  [98.7 F (37.1 C)-99.5 F (37.5 C)] 99.4 F (37.4 C) (11/21 0541) Pulse Rate:  [71-75] 71 (11/21 0541) Resp:  [16-18] 18 (11/21 0541) BP: (114-150)/(54-72) 124/72 (11/21 0829) SpO2:  [96 %-100 %] 97 % (11/21 0541) Weight:  [169 lb 8.5 oz (76.9 kg)] 169 lb 8.5 oz (76.9 kg) (11/21 0448) Physical Exam: General: elderly male, frail appearing, laying comfortably in bed in NAD Cardiovascular: regular rate and rhythm no MRG Respiratory: NWOB. CTAB Abdomen: soft, non-distended, non-tender, +BS Extremities: 1+ pitting edema in lower extremities bilaterally with skin changes consistent with venous stasis.  Neuro: Alert and oriented to person, place, year, and month.  Laboratory:  Recent Labs Lab 12/10/15 1736 12/11/15 0509  WBC 9.0 8.7  HGB 13.6 15.0  HCT 39.2 43.2  PLT 200 190    Recent  Labs Lab 12/10/15 1736 12/11/15 0509 12/12/15 0547 12/13/15 0622  NA 143 145 145 143  K 4.0 4.1 4.2 3.8  CL 106 108 111 108  CO2 26 26 25 26   BUN 40* 33* 30* 30*  CREATININE 3.22* 2.55* 2.29* 2.03*  CALCIUM 9.6 9.2 8.7* 8.7*  PROT 8.6* 8.0   --   --   BILITOT 0.7 0.6  --   --   ALKPHOS 83 76  --   --   ALT 15* 15*  --   --   AST 22 25  --   --   GLUCOSE 121* 117* 112* 118*    Recent Labs Lab 12/10/15 1900 12/11/15 0509 12/12/15 0547 12/13/15 0622  INR 3.43 3.54 2.90 2.52   Imaging/Diagnostic Tests: None new.   Steve Rattler, DO 12/13/2015, 9:48 AM PGY-1, [facility] Intern pager: [phone], text pages welcome

## 2015-12-13 NOTE — Clinical Social Work Note (Signed)
[name] presented pt with bed offers. Pt has choosen to go to [facility]. [name] spole with [name] with [facility] and [name] faxed clinicals for insurance authorization to begin. Pt will be able to go to [facility] when authorization is received (estimated aprx. 4 hours)  [name], LCSWA [phone]

## 2015-12-14 LAB — PROTIME-INR
INR: 2.05
Prothrombin Time: 23.5 seconds — ABNORMAL HIGH (ref 11.4–15.2)

## 2015-12-14 LAB — GLUCOSE, CAPILLARY
GLUCOSE-CAPILLARY: 117 mg/dL — AB (ref 65–99)
Glucose-Capillary: 116 mg/dL — ABNORMAL HIGH (ref 65–99)

## 2015-12-14 MED ORDER — WARFARIN SODIUM 5 MG PO TABS: 5.0000 mg | ORAL_TABLET | Freq: Once | ORAL | Status: DC

## 2015-12-14 NOTE — Clinical Social Work Note (Signed)
CSW has received insurance authorization at this time. [facility] authorization Id: [id number]. [employer] ID still pending until review on 12/16/15. RVB for 550 minutes of therapy a week. CSW contacted [facility] to check bed availability.   [address]Karess Harner [name], [place] [phone]

## 2015-12-14 NOTE — Clinical Social Work Placement (Signed)
   CLINICAL SOCIAL WORK PLACEMENT  NOTE  Date:  12/14/2015  Patient Details  Name: [name] MRN: [id number] Date of Birth: [birth year]  Clinical Social Work is seeking post-discharge placement for this patient at the Skilled  Nursing Facility level of care (*CSW will initial, date and re-position this form in  chart as items are completed):  Yes   Patient/family provided with [facility] Clinical Social Work Department's list of facilities offering this level of care within the geographic area requested by the patient (or if unable, by the patient's family).  Yes   Patient/family informed of their freedom to choose among providers that offer the needed level of care, that participate in Medicare, Medicaid or managed care program needed by the patient, have an available bed and are willing to accept the patient.      Patient/family informed of [facility]'s ownership interest in [facility] and [facility], as well as of the fact that they are under no obligation to receive care at these facilities.  PASRR submitted to EDS on       PASRR number received on       Existing PASRR number confirmed on 12/13/15     FL2 transmitted to all facilities in geographic area requested by pt/family on 12/13/15     FL2 transmitted to all facilities within larger geographic area on       Patient informed that his/her managed care company has contracts with or will negotiate with certain facilities, including the following:        Yes   Patient/family informed of bed offers received.  [facility]     Physician recommends and patient chooses bed at      [facility] 12[facility].  Patient to be transferred to facility by PTAR     Patient family notified on 12/13/15 of transfer.  Name of family member notified:        PHYSICIAN Please prepare prescriptions     Additional Comment:     _______________________________________________ Blake AmericanBridget A Mayton, LCSW 12/14/2015, 9:35 AM

## 2015-12-14 NOTE — Care Management Important Message (Signed)
Important Message  Patient Details  Name: [name] MRN: [id number] Date of Birth: [birth year]   Medicare Important Message Given:  Yes    Terisa Belardo 12/14/2015, 1:32 PM

## 2015-12-14 NOTE — Progress Notes (Signed)
ANTICOAGULATION CONSULT NOTE  Pharmacy Consult for warfarin Indication: Hx Afib/DVT  No Known Allergies  Patient Measurements: Height: 5\' 8"  (172.7 cm) Weight: 174 lb 12.8 oz (79.3 kg) IBW/kg (Calculated) : 68.4  Vital Signs: Temp: 98.5 F (36.9 C) (11/22 0846) Temp Source: Oral (11/22 0846) BP: 144/60 (11/22 0846) Pulse Rate: 53 (11/22 0846)  Labs:  Recent Labs  12/12/15 0547 12/13/15 0622 12/14/15 0536  LABPROT 30.9* 27.7* 23.5*  INR 2.90 2.52 2.05  CREATININE 2.29* 2.03*  --     Estimated Creatinine Clearance: 26.2 mL/min (by C-G formula based on SCr of 2.03 mg/dL (H)).  Assessment: 86 YOM resumed on warfarin from PTA for hx Afib/DVT. Admitted with INR of 3.43 on PTA dose.   INR today remains therapeutic though trending down (INR 2.05 << 2.52, goal of 2-3). It is noted that doses were held on 11/18-11/19. CBC from 11/19 wnl and stable. No overt s/sx of bleeding noted. Will recheck CBC on 11/23 if still here.   Goal of Therapy:  INR 2-3 Monitor platelets by anticoagulation protocol: Yes   Plan:  1. Warfarin 5 mg x 1 dose at 1800 today (if still here) 2. Will continue to monitor for any signs/symptoms of bleeding and will follow up with PT/INR in the a.m. (if still here)  Thank you for allowing pharmacy to be a part of this patient's care.  Georgina PillionElizabeth Deontra Pereyra, PharmD, BCPS Clinical Pharmacist Pager: [phone] Clinical phone for 12/14/2015 from 7a-3:30p: [phone] If after 3:30p, please call main pharmacy at: [phone] 12/14/2015 11:43 AM

## 2015-12-14 NOTE — Progress Notes (Signed)
  Social Note  Patient was discharged yesterday pending SNF bed availability.  Evaluated today, still medically stable for discharge to SNF.   Dolores PattyAngela Hazely Sealey, DO PGY-1, [facility] 12/14/2015 9:46 AM

## 2015-12-14 NOTE — Progress Notes (Signed)
[facility] was [facility]. MD was made aware of change in heart rhythm before telemetry was discontinued. IV was d/c'd and report was called  to [facility].  Patient left floor with V/S and blood sugar stable, with no c/o pain. Patient confirmed he had all belongings including glasses, clothes and wallet. PTAR took patient off the floor.

## 2015-12-14 NOTE — Clinical Social Work Note (Signed)
Clinical Social Worker facilitated patient discharge including contacting patient family and facility to confirm patient discharge plans.  Clinical information faxed to facility and family agreeable with plan.  CSW arranged ambulance transport via [facility] to [facility].  RN to call report prior to discharge.  Clinical Social Worker will sign off for now as social work intervention is no longer needed. Please consult us again if new need arises.  Momina Hunton Mayton, LCSWA [phone]  

## 2016-01-11 ENCOUNTER — Inpatient Hospital Stay (HOSPITAL_COMMUNITY)
Admission: EM | Admit: 2016-01-11 | Discharge: 2016-01-18 | DRG: 300 | Disposition: A | Discharge status: Skilled Nursing Facility | Payer: Medicare HMO | Attending: Family Medicine | Admitting: Family Medicine

## 2016-01-11 ENCOUNTER — Emergency Department (HOSPITAL_BASED_OUTPATIENT_CLINIC_OR_DEPARTMENT_OTHER): Admit: 2016-01-11 | Discharge: 2016-01-11 | Disposition: A | Discharge status: Home / Self Care | Payer: Medicare HMO

## 2016-01-11 ENCOUNTER — Encounter (HOSPITAL_COMMUNITY): Payer: Self-pay | Admitting: Emergency Medicine

## 2016-01-11 DIAGNOSIS — Z79899 Other long term (current) drug therapy: Secondary | ICD-10-CM

## 2016-01-11 DIAGNOSIS — E860 Dehydration: Secondary | ICD-10-CM | POA: Diagnosis present

## 2016-01-11 DIAGNOSIS — I82491 Acute embolism and thrombosis of other specified deep vein of right lower extremity: Principal | ICD-10-CM | POA: Diagnosis present

## 2016-01-11 DIAGNOSIS — M6281 Muscle weakness (generalized): Secondary | ICD-10-CM

## 2016-01-11 DIAGNOSIS — N400 Enlarged prostate without lower urinary tract symptoms: Secondary | ICD-10-CM | POA: Diagnosis present

## 2016-01-11 DIAGNOSIS — I130 Hypertensive heart and chronic kidney disease with heart failure and stage 1 through stage 4 chronic kidney disease, or unspecified chronic kidney disease: Secondary | ICD-10-CM | POA: Diagnosis present

## 2016-01-11 DIAGNOSIS — I5032 Chronic diastolic (congestive) heart failure: Secondary | ICD-10-CM | POA: Diagnosis present

## 2016-01-11 DIAGNOSIS — I480 Paroxysmal atrial fibrillation: Secondary | ICD-10-CM | POA: Diagnosis present

## 2016-01-11 DIAGNOSIS — Z86718 Personal history of other venous thrombosis and embolism: Secondary | ICD-10-CM

## 2016-01-11 DIAGNOSIS — N179 Acute kidney failure, unspecified: Secondary | ICD-10-CM | POA: Diagnosis not present

## 2016-01-11 DIAGNOSIS — Z87891 Personal history of nicotine dependence: Secondary | ICD-10-CM

## 2016-01-11 DIAGNOSIS — R2689 Other abnormalities of gait and mobility: Secondary | ICD-10-CM

## 2016-01-11 DIAGNOSIS — I82409 Acute embolism and thrombosis of unspecified deep veins of unspecified lower extremity: Secondary | ICD-10-CM | POA: Diagnosis present

## 2016-01-11 DIAGNOSIS — N183 Chronic kidney disease, stage 3 (moderate): Secondary | ICD-10-CM | POA: Diagnosis present

## 2016-01-11 DIAGNOSIS — F419 Anxiety disorder, unspecified: Secondary | ICD-10-CM | POA: Diagnosis present

## 2016-01-11 DIAGNOSIS — Z8673 Personal history of transient ischemic attack (TIA), and cerebral infarction without residual deficits: Secondary | ICD-10-CM

## 2016-01-11 DIAGNOSIS — E1122 Type 2 diabetes mellitus with diabetic chronic kidney disease: Secondary | ICD-10-CM | POA: Diagnosis present

## 2016-01-11 DIAGNOSIS — F0390 Unspecified dementia without behavioral disturbance: Secondary | ICD-10-CM | POA: Diagnosis present

## 2016-01-11 DIAGNOSIS — I82401 Acute embolism and thrombosis of unspecified deep veins of right lower extremity: Secondary | ICD-10-CM | POA: Diagnosis present

## 2016-01-11 DIAGNOSIS — Z961 Presence of intraocular lens: Secondary | ICD-10-CM | POA: Diagnosis present

## 2016-01-11 DIAGNOSIS — M7989 Other specified soft tissue disorders: Secondary | ICD-10-CM | POA: Diagnosis not present

## 2016-01-11 DIAGNOSIS — E039 Hypothyroidism, unspecified: Secondary | ICD-10-CM | POA: Diagnosis present

## 2016-01-11 DIAGNOSIS — Z7901 Long term (current) use of anticoagulants: Secondary | ICD-10-CM

## 2016-01-11 DIAGNOSIS — Z86711 Personal history of pulmonary embolism: Secondary | ICD-10-CM

## 2016-01-11 DIAGNOSIS — K219 Gastro-esophageal reflux disease without esophagitis: Secondary | ICD-10-CM | POA: Diagnosis present

## 2016-01-11 DIAGNOSIS — M25512 Pain in left shoulder: Secondary | ICD-10-CM

## 2016-01-11 DIAGNOSIS — F0150 Vascular dementia without behavioral disturbance: Secondary | ICD-10-CM

## 2016-01-11 DIAGNOSIS — I2510 Atherosclerotic heart disease of native coronary artery without angina pectoris: Secondary | ICD-10-CM | POA: Diagnosis present

## 2016-01-11 LAB — CBC WITH DIFFERENTIAL/PLATELET
BASOS ABS: 0 10*3/uL (ref 0.0–0.1)
BASOS PCT: 0 %
EOS ABS: 0 10*3/uL (ref 0.0–0.7)
EOS PCT: 0 %
HCT: 33 % — ABNORMAL LOW (ref 39.0–52.0)
HEMOGLOBIN: 11.4 g/dL — AB (ref 13.0–17.0)
LYMPHS ABS: 3.6 10*3/uL (ref 0.7–4.0)
Lymphocytes Relative: 29 %
MCH: 26.5 pg (ref 26.0–34.0)
MCHC: 34.5 g/dL (ref 30.0–36.0)
MCV: 76.7 fL — ABNORMAL LOW (ref 78.0–100.0)
Monocytes Absolute: 0.9 10*3/uL (ref 0.1–1.0)
Monocytes Relative: 7 %
NEUTROS PCT: 64 %
Neutro Abs: 8.1 10*3/uL — ABNORMAL HIGH (ref 1.7–7.7)
PLATELETS: 271 10*3/uL (ref 150–400)
RBC: 4.3 MIL/uL (ref 4.22–5.81)
RDW: 15.4 % (ref 11.5–15.5)
WBC: 12.6 10*3/uL — AB (ref 4.0–10.5)

## 2016-01-11 LAB — BASIC METABOLIC PANEL
Anion gap: 11 (ref 5–15)
BUN: 91 mg/dL — ABNORMAL HIGH (ref 6–20)
CHLORIDE: 99 mmol/L — AB (ref 101–111)
CO2: 28 mmol/L (ref 22–32)
Calcium: 8.8 mg/dL — ABNORMAL LOW (ref 8.9–10.3)
Creatinine, Ser: 4.51 mg/dL — ABNORMAL HIGH (ref 0.61–1.24)
GFR, EST AFRICAN AMERICAN: 13 mL/min — AB (ref >60)
GFR, EST NON AFRICAN AMERICAN: 11 mL/min — AB (ref >60)
Glucose, Bld: 134 mg/dL — ABNORMAL HIGH (ref 65–99)
POTASSIUM: 3.3 mmol/L — AB (ref 3.5–5.1)
SODIUM: 138 mmol/L (ref 135–145)

## 2016-01-11 LAB — URINALYSIS, COMPLETE (UACMP) WITH MICROSCOPIC
BILIRUBIN URINE: NEGATIVE
Glucose, UA: NEGATIVE mg/dL
Ketones, ur: NEGATIVE mg/dL
NITRITE: NEGATIVE
PH: 5 (ref 5.0–8.0)
Protein, ur: 100 mg/dL — AB
SPECIFIC GRAVITY, URINE: 1.012 (ref 1.005–1.030)
SQUAMOUS EPITHELIAL / LPF: NONE SEEN

## 2016-01-11 LAB — PROTIME-INR
INR: 2.92
PROTHROMBIN TIME: 31.1 s — AB (ref 11.4–15.2)

## 2016-01-11 MED ORDER — CEPHALEXIN 250 MG PO CAPS
250.0000 mg | ORAL_CAPSULE | Freq: Two times a day (BID) | ORAL | Status: AC
  Administered 2016-01-11 – 2016-01-15 (×9): 250 mg via ORAL
  Filled 2016-01-11 (×9): qty 1

## 2016-01-11 MED ORDER — POLYETHYLENE GLYCOL 3350 17 G PO PACK
17.0000 g | PACK | Freq: Every day | ORAL | Status: DC | PRN
  Administered 2016-01-14: 17 g via ORAL
  Filled 2016-01-11 (×2): qty 1

## 2016-01-11 MED ORDER — ACETAMINOPHEN 500 MG PO TABS
500.0000 mg | ORAL_TABLET | Freq: Four times a day (QID) | ORAL | Status: DC | PRN
  Administered 2016-01-12: 500 mg via ORAL
  Filled 2016-01-11: qty 1

## 2016-01-11 MED ORDER — ISOSORBIDE MONONITRATE ER 60 MG PO TB24
60.0000 mg | ORAL_TABLET | Freq: Every day | ORAL | Status: DC
  Administered 2016-01-12 – 2016-01-18 (×7): 60 mg via ORAL
  Filled 2016-01-11 (×7): qty 1

## 2016-01-11 MED ORDER — PANTOPRAZOLE SODIUM 40 MG PO TBEC
40.0000 mg | DELAYED_RELEASE_TABLET | Freq: Every day | ORAL | Status: DC
  Administered 2016-01-12 – 2016-01-18 (×7): 40 mg via ORAL
  Filled 2016-01-11 (×7): qty 1

## 2016-01-11 MED ORDER — NITROGLYCERIN 0.4 MG SL SUBL
0.4000 mg | SUBLINGUAL_TABLET | SUBLINGUAL | Status: DC | PRN
Start: 2016-01-11 — End: 2016-01-18

## 2016-01-11 MED ORDER — DIVALPROEX SODIUM 125 MG PO DR TAB
125.0000 mg | DELAYED_RELEASE_TABLET | Freq: Two times a day (BID) | ORAL | Status: DC
  Administered 2016-01-11 – 2016-01-18 (×14): 125 mg via ORAL
  Filled 2016-01-11 (×15): qty 1

## 2016-01-11 MED ORDER — CEPHALEXIN 250 MG PO CAPS: 250.0000 mg | ORAL_CAPSULE | Freq: Four times a day (QID) | ORAL | Status: DC

## 2016-01-11 MED ORDER — SODIUM CHLORIDE 0.9 % IV BOLUS (SEPSIS)
1000.0000 mL | Freq: Once | INTRAVENOUS | Status: AC
  Administered 2016-01-11: 1000 mL via INTRAVENOUS

## 2016-01-11 MED ORDER — CARVEDILOL 6.25 MG PO TABS
6.2500 mg | ORAL_TABLET | Freq: Two times a day (BID) | ORAL | Status: DC
  Administered 2016-01-12 – 2016-01-18 (×13): 6.25 mg via ORAL
  Filled 2016-01-11 (×14): qty 1

## 2016-01-11 MED ORDER — SODIUM CHLORIDE 0.9 % IV SOLN
INTRAVENOUS | Status: DC
  Administered 2016-01-11: 1000 mL via INTRAVENOUS
  Administered 2016-01-12: 125 mL/h via INTRAVENOUS

## 2016-01-11 MED ORDER — AMIODARONE HCL 200 MG PO TABS
200.0000 mg | ORAL_TABLET | Freq: Every day | ORAL | Status: DC
  Administered 2016-01-12 – 2016-01-18 (×7): 200 mg via ORAL
  Filled 2016-01-11 (×7): qty 1

## 2016-01-11 MED ORDER — LEVOTHYROXINE SODIUM 75 MCG PO TABS
75.0000 ug | ORAL_TABLET | Freq: Every day | ORAL | Status: DC
  Administered 2016-01-12 – 2016-01-18 (×6): 75 ug via ORAL
  Filled 2016-01-11 (×7): qty 1

## 2016-01-11 MED ORDER — LATANOPROST 0.005 % OP SOLN
1.0000 [drp] | Freq: Every day | OPHTHALMIC | Status: DC
  Administered 2016-01-11 – 2016-01-17 (×7): 1 [drp] via OPHTHALMIC
  Filled 2016-01-11: qty 2.5

## 2016-01-11 MED ORDER — INSULIN ASPART 100 UNIT/ML ~~LOC~~ SOLN
0.0000 [IU] | Freq: Three times a day (TID) | SUBCUTANEOUS | Status: DC
  Administered 2016-01-13 – 2016-01-18 (×5): 1 [IU] via SUBCUTANEOUS

## 2016-01-11 MED ORDER — TAMSULOSIN HCL 0.4 MG PO CAPS
0.8000 mg | ORAL_CAPSULE | Freq: Every day | ORAL | Status: DC
  Administered 2016-01-11 – 2016-01-17 (×7): 0.8 mg via ORAL
  Filled 2016-01-11 (×7): qty 2

## 2016-01-11 NOTE — ED Notes (Signed)
Patient placed on continuous pulse oximetry and blood pressure cuff; patient is resting and has no needs at this time

## 2016-01-11 NOTE — ED Notes (Signed)
Talking with son , he states that he is unable to care for his dad at home

## 2016-01-11 NOTE — ED Notes (Signed)
Transported to [facility]ascular. °

## 2016-01-11 NOTE — ED Provider Notes (Signed)
[facility]-EM[facility]ENCY DEPT Provider Note   CSN: [id number] Arrival date & time: 01/11/16  1352     History   Chief Complaint Chief Complaint  Patient presents with  . Leg Swelling    HPI [name] is a 82 y.o. male.  Patient is a 82 year old male with multiple medical comorbidities who presents from home with chief complaint of R leg swelling. 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 Patient complains of no pain or any symptoms however was sent by home health nurse due to the right leg swelling. Son expressed that the main concern to the charge nurse that he does not have around-the-clock care at home and only has 8 hours of coverage by her health. Patient over the last 30 days has been unable to get out of bed and the son is concerned that he is not safe. Patient has not taken any falls or sustaining any injuries during this time. Unclear if patient has been receiving all his medications. Patient was previously in a nursing home however his Medicare days expired. Son also states that he works sometimes pulled hours a day and is unable to help care for his dad.    The history is provided by the patient and a relative.    Past Medical History:  Diagnosis Date  . Anginal pain (HCC)   . Anxiety   . Arthritis    "mostly in my knees and shoulders"  . Asthma    "touch"  . Chronic abdominal pain   . Chronic back pain   . Collapsed lung    "for 14 years" right   . Complication of anesthesia   . Coronary artery disease   . Depression   . Diabetes mellitus   . DVT, lower extremity (HCC) 1980's   LLE  . Esophageal stricture   . GERD (gastroesophageal reflux disease)   . Glaucoma   . Gout    no problems in 2 years  . Hearing loss   . Hemorrhoids   . Hiatal hernia   . Hyperlipemia   . Hypertension   . Hypothyroidism   . Myocardial infarction 1987; 1989  . Paroxysmal atrial fibrillation (HCC)   . Pneumonia 1970's  . PONV (postoperative nausea and vomiting)   .  Pulmonary embolism, bilateral (HCC) 2010  . Renal disorder ~ 03/2011   "wasn't functioning right"  . Schatzki's ring   . Stroke [facility](HCC)    "just a little one"    Patient Active Problem List   Diagnosis Date Noted  . Dehydration   . Acute cystitis without hematuria   . Pain in both lower extremities   . Acute renal failure superimposed on chronic kidney disease (HCC)   . UTI (urinary tract infection) 12/10/2015  . Chronic RLQ pain 09/10/2014  . Chest pain 01/11/2014  . Chest pain at rest 01/08/2014  . Unstable angina (HCC) 07/15/2013  . Abdominal pain, unspecified site 06/02/2012  . Schatzki's ring - nonobstructive 05/31/2012  . Dementia, senile 05/20/2012  . Constipation, chronic 01/28/2012  . Esophageal stricture 01/28/2012  . Paraesophageal sliding hiatal hernia 01/28/2012  . Anticoagulated on warfarin 01/28/2012  . DJD (degenerative joint disease) of knee 09/19/2011  . GERD (gastroesophageal reflux disease) 09/19/2011  . CKD (chronic kidney disease) stage 3, GFR 30-59 ml/min 08/08/2011  . BPH (benign prostatic hyperplasia) 08/08/2011  . Type II or unspecified type diabetes mellitus with renal manifestations, uncontrolled(250.42) 03/21/2006  . HYPERTENSION, BENIGN SYSTEMIC 03/21/2006  . CLAUDICATION, INTERMITTENT 03/21/2006  .  RHINITIS, ALLERGIC 03/21/2006    Past Surgical History:  Procedure Laterality Date  . APPENDECTOMY  1950  . CARDIAC CATHETERIZATION    . CATARACT EXTRACTION W/ INTRAOCULAR LENS  IMPLANT, BILATERAL Bilateral 1990's  . COLONOSCOPY N/A 05/01/2012   Procedure: COLONOSCOPY;  Surgeon: Theda BelfastPatrick D Hung, MD;  [facility];  Service: Endoscopy;  Laterality: N/A;  . CORONARY ANGIOPLASTY WITH STENT PLACEMENT     "think I have 4 total"  . ESOPHAGOGASTRODUODENOSCOPY  12/22/2010   Procedure: ESOPHAGOGASTRODUODENOSCOPY (EGD);  Surgeon: Theda BelfastPatrick D Hung;  Location: [facility] ENDOSCOPY;  Service: Endoscopy;  Laterality: N/A;  . ESOPHAGOGASTRODUODENOSCOPY N/A 04/30/2012    Procedure: ESOPHAGOGASTRODUODENOSCOPY (EGD);  Surgeon: Theda BelfastPatrick D Hung, MD;  [facility];  Service: Endoscopy;  Laterality: N/A;  . HERNIA REPAIR    . INGUINAL HERNIA REPAIR Bilateral 03/28/2012   Procedure: LAPAROSCOPIC BILATERAL INGUINAL HERNIA REPAIR;  Surgeon: Ardeth SportsmanSteven C. Gross, MD;  Location: [facility] ORS;  Service: General;  Laterality: Bilateral;  . INSERTION OF MESH Bilateral 03/28/2012   Procedure: INSERTION OF MESH;  Surgeon: Ardeth SportsmanSteven C. Gross, MD;  Location: [facility] ORS;  Service: General;  Laterality: Bilateral;  . LEFT HEART CATHETERIZATION WITH CORONARY ANGIOGRAM N/A 02/19/2011   Procedure: LEFT HEART CATHETERIZATION WITH CORONARY ANGIOGRAM;  Surgeon: Robynn PaneMohan N Harwani, MD;  [facility];  Service: Cardiovascular;  Laterality: N/A;       Home Medications    Prior to Admission medications   Medication Sig Start Date End Date Taking? Authorizing Provider  acetaminophen (TYLENOL) 500 MG tablet Take 500-1,000 mg by mouth every 6 (six) hours as needed for headache (pain).    Historical Provider, MD  amiodarone (PACERONE) 200 MG tablet Take 200 mg by mouth daily.     Historical Provider, MD  carvedilol (COREG) 3.125 MG tablet Take 2 tablets (6.25 mg total) by mouth 2 (two) times daily with a meal. 8am, 6pm 12/13/15   Tillman SersAngela C Riccio, DO  divalproex (DEPAKOTE) 125 MG DR tablet Take 125 mg by mouth 2 (two) times daily.    Historical Provider, MD  furosemide (LASIX) 40 MG tablet Take 80 mg by mouth 2 (two) times daily.     Historical Provider, MD  isosorbide mononitrate (IMDUR) 60 MG 24 hr tablet Take 60 mg by mouth daily.    Historical Provider, MD  latanoprost (XALATAN) 0.005 % ophthalmic solution Place 1 drop into both eyes at bedtime.    Historical Provider, MD  levothyroxine (SYNTHROID, LEVOTHROID) 75 MCG tablet Take 75 mcg by mouth daily before breakfast.    Historical Provider, MD  nitroGLYCERIN (NITROSTAT) 0.4 MG SL tablet Place 1 tablet (0.4 mg total) under the tongue every 5  (five) minutes as needed for chest pain. 04/24/14   Garlon HatchetLisa M Sanders, PA-C  omeprazole (PRILOSEC) 20 MG capsule Take 20 mg by mouth daily.    Historical Provider, MD  ondansetron (ZOFRAN ODT) 4 MG disintegrating tablet 4mg  ODT q4 hours prn nausea/vomit Patient not taking: Reported on 12/10/2015 03/27/14   Purvis SheffieldForrest Harrison, MD  tamsulosin (FLOMAX) 0.4 MG CAPS capsule Take 0.8 mg by mouth at bedtime.     Historical Provider, MD  warfarin (COUMADIN) 3 MG tablet Take 1 tablet (3 mg total) by mouth daily. 12/13/15   Tillman SersAngela C Riccio, DO    Family History Family History  Problem Relation Age of Onset  . Diabetes Mother   . Heart disease Father   . Stroke Father   . Lung cancer Brother   . Hypertension Neg Hx   .  Hyperlipidemia Neg Hx   . Alcohol abuse Neg Hx   . Colon cancer Neg Hx     Social History Social History  Substance Use Topics  . Smoking status: Former Smoker    Packs/day: 2.00    Years: 3.00    Types: Cigarettes    Start date: 01/27/1946    Quit date: 01/22/1981  . Smokeless tobacco: Never Used  . Alcohol use No     Comment: 07/12/2011; "last drink of alcohol was ~ 1983"     Allergies   Patient has no known allergies.   Review of Systems Review of Systems  Unable to perform ROS: Dementia  Constitutional: Negative for fever.  Eyes: Negative for pain and visual disturbance.  Respiratory: Negative for cough and shortness of breath.   Cardiovascular: Positive for leg swelling. Negative for chest pain and palpitations.  Gastrointestinal: Negative for abdominal pain, diarrhea, nausea and vomiting.  Genitourinary: Negative for dysuria.  Musculoskeletal: Negative for back pain.  Skin: Negative for rash.  Neurological: Negative for syncope, weakness, light-headedness, numbness and headaches.  All other systems reviewed and are negative.    Physical Exam Updated Vital Signs BP 138/67   Pulse 66   Temp 98.6 F (37 C) (Oral)   Resp 18   SpO2 100%   Physical Exam    Constitutional: He appears well-developed and well-nourished. No distress.  HENT:  Head: Normocephalic and atraumatic.  Eyes: Conjunctivae are normal.  Neck: Neck supple.  Cardiovascular: Normal rate and regular rhythm.   No murmur heard. Pulmonary/Chest: Effort normal and breath sounds normal. No respiratory distress. He has no wheezes. He has no rales.  Abdominal: Soft. He exhibits no distension. There is no tenderness.  Musculoskeletal: He exhibits edema (1+ b/l pitting edema. R calf marginally swollen comapred to L calf. No calf tenderness).  Neurological: He is alert. He is disoriented (h/o dementia).  Skin: Skin is warm and dry. He is not diaphoretic.  Psychiatric: He has a normal mood and affect.  Nursing note and vitals reviewed.    ED Treatments / Results  Labs (all labs ordered are listed, but only abnormal results are displayed) Labs Reviewed  CBC WITH DIFFERENTIAL/PLATELET - Abnormal; Notable for the following:       Result Value   WBC 12.6 (*)    Hemoglobin 11.4 (*)    HCT 33.0 (*)    MCV 76.7 (*)    Neutro Abs 8.1 (*)    All other components within normal limits  BASIC METABOLIC PANEL - Abnormal; Notable for the following:    Potassium 3.3 (*)    Chloride 99 (*)    Glucose, Bld 134 (*)    BUN 91 (*)    Creatinine, Ser 4.51 (*)    Calcium 8.8 (*)    GFR calc non Af Amer 11 (*)    GFR calc Af Amer 13 (*)    All other components within normal limits  PROTIME-INR - Abnormal; Notable for the following:    Prothrombin Time 31.1 (*)    All other components within normal limits    EKG  EKG Interpretation None       Radiology No results found.  Procedures Procedures (including critical care time)  Medications Ordered in ED Medications  sodium chloride 0.9 % bolus 1,000 mL (1,000 mLs Intravenous New Bag/Given 01/11/16 1853)     Initial Impression / Assessment and Plan / ED Course  I have reviewed the triage vital signs and the nursing  notes.  Pertinent labs & imaging results that were available during my care of the patient were reviewed by me and considered in my medical decision making (see chart for details).  Clinical Course    Patient is a 82 year old male with history of dementia, DVT, CAD, diabetes, A. fib, stroke who presents with right lower leg swelling. Patient takes Coumadin for history of DVT and A. fib.  Son also expressed concerns about the patient staying in bed all day for the last 30 days. Patient has home health however there only with him 8 hours a day. Some concern that patient may be unsafe without 24-hour care.  On exam patient has asymmetrical swelling in the right calf without any calf tenderness. DVT study shows acute peroneal vein DVTs. Labs remarkable for acute kidney injury with doubling of baseline creatinine to 4.7. Patient also mildly hypokalemic 3.3 and mild leukocytosis 12.6. We'll check UA as well. Patient given 1 L normal saline over 2 hours for dehydration/acute kidney injury.  I spoke with hematology, Dr. Bertis Ruddy regarding anticoagulation. Patient presents with a acute DVT while therapeutic on warfarin. INR today was 2.9 and patient has been either therapeutic or supratherapeutic within the last month. Dr. Bertis Ruddy recommends staying with warfarin for this patient. Aspirin may be added however this doubles her troubles his bleeding risk. She also recommends against IVC filter given the patient's age and no palpitations of IVC filters. Since the patient is very limited in his mobility and gets dehydrated easily patient will be continued at risk for DVTs.  Patient will be admitted to family medicine for AKI and may need coordination for nursing home placement.  Patient seen with attending, Dr. Judd Lien.  Final Clinical Impressions(s) / ED Diagnoses   Final diagnoses:  Acute deep vein thrombosis (DVT) of right lower extremity, unspecified vein (HCC)  AKI (acute kidney injury) [facility])    New  Prescriptions New Prescriptions   No medications on file     Dwana Melena, DO 01/11/16 2004    Geoffery Lyons, MD 01/11/16 2322

## 2016-01-11 NOTE — ED Triage Notes (Signed)
Pt here from home where home health  Nurse advised pt to come back to the ED for  Right leg swelling , pt does not have 24 hr care which  Is part of the reason he is back in the ED

## 2016-01-11 NOTE — Progress Notes (Signed)
ANTICOAGULATION CONSULT NOTE t  Pharmacy Consult for coumadin  Indication: Hx Afib/DVT  No Known Allergies   Vital Signs: Temp: 98.6 F (37 C) (12/20 1422) Temp Source: Oral (12/20 1422) BP: 155/70 (12/20 2145) Pulse Rate: 59 (12/20 2145)  Labs:  Recent Labs  01/11/16 1641  HGB 11.4*  HCT 33.0*  PLT 271  LABPROT 31.1*  INR 2.92  CREATININE 4.51*    CrCl cannot be calculated (Unknown ideal weight.).   Assessment 86 yoM on coumadin PTA for Hx Afib/DVT. INR 2.92 with last warfarin dose taken earlier today. Prior to arrival dose of coumadin is 2 mg on Sunday and 4 mg all other days.   Goal of Therapy:  INR 2-3 Monitor platelets by anticoagulation protocol: Yes   Plan:  1. Hold on giving any further coumadin tonight as INR is therapeutic and already taken dose for today (12/20) 2. Daily INR and CBC   Pollyann SamplesAndy Logon [place]tech, PharmD, BCPS 01/11/2016, 10:22 PM Pager: [phone]

## 2016-01-11 NOTE — H&P (Signed)
Family Medicine Teaching [facility] Admission History and Physical Service Pager: [phone]  Patient name: [name] Medical record number: [id number] Date of birth: 1929/09/14 Age: 82 y.o. Gender: male  Primary Care Provider: Willow Ora, MD Consultants: Heme Code Status: FULL  Chief Complaint: Leg pain.  Assessment and Plan: Chosen Geske is a 82 y.o. male presenting with right leg pain. PMH is significant for recent hospital admission for UTI, T2DM, HTN, Esophageal stricture, Dementia, CKD stage 3, BPH, CAD, PAF, Hx of DVTs( 1980s), hx of stroke  # Right leg pain with bilateral edema: Unable to say when symptoms started. Pain in Right leg. Edema worse in left leg. Pulses intact. Doppler + for peroneal vein DVT. Has a history of DVT per record review. Patient with positive Homans sign on right leg and calf tenderness. 2+ pitting edema noted on both legs (Left>right), with venous stasis changes. Based onwells criteria high risk for DVT. INR 2.92 making DVT an interesting finding. Consider extensive immobility and venous insufficiency as primary causes. - Admit for observation;  MedSurg, Dr. Deirdre Priest - Hematology consulted by ED: Recommends continued warfarin use for DVT treatment. Can add aspirin for additional coverage if benefit thought to outweigh the risk of bleeds. Not an ideal candidate for IVC filter. Appreciate recommendations. - Warfarin per pharmacy - Monitor CBC, INR/PT - PT/OT - Monitor discomfort and vitals   # Dysuria:Reports some dysuria and buttock pain. RecentE coli on urine culture (pan sensitive). Previous encounters patient had reported noncompliance with meds. Patient "isn't sure" today when inquired. UA positive for hemoglobin, large leukocytes. Vitals stable. Afebrile. Consider etiology to be 2/2 BPH and associated urinary retention. -Urine culture pending -initiate Keflex at renal dosing -CBC and BMET in the AM  -consider post-void residual in  AM -Monitor for signs of sepsis/pyelonephritis.   # AoCKD stage 3: Baseline (SCr 1.8 -2.0) history of hypertension in type 2 diabetes likely contributed to patient's chronic kidney disease. Presenting with AKI with Cr 4.5. Likely 2/2 decreased fluid intake.  - Trend BMP  - NS 125 ml/hr  - Watch UOP for signs of oliguria - holding home Lasix  # HFpEF/CAD: Last echo with ejection fraction of 50-55% (2015). Denies dyspnea or orthopnea. CXR negative for edema. No crackle on exam - ContinueCoreg 3.125 BID and Imdur - Hold Lasix  # PAF: - Continue home Amiodarone, Coreg - Consider contacting patient's cardiologist if he develops arrhythmias or signs of CHF  # Dementia - Noted on previous admission.   - PT and OT consult  - Monitor  # Hypothyroidism:  - HomeSynthroid   # T2DM: - Sensitive SSI  # HTN: Blood pressure elevated on admission  - IMDUR 60 mg, Coreg 3.125 BID,   FEN/GI: PPI Prophylaxis: Wafarin   Disposition: pending medical improvement  History of Present Illness:  [name] is a 82 y.o. male presenting with many comorbidities who presents from home with chief complaint of R leg swelling. He has a history of DVT/PE (years ago) and is on warfarin. Patient states that he has pain in the right leg, but is unable to tell me when it started or how the pain is made better/worse. According to the ED, he was evaluated by a home health nurse due to the right leg swelling. He states that he doesn't "do anything" at home, and spends all day laying or seated. Patient denies any falls/injuries recently. No fever, chills, nausea, vomiting, diarrhea, headache, vision changes, chest pain, shortness of breath.  His son expressed  to the ED provider that he does not have around-the-clock care at home and only has 8 hours of coverage by home health. Patient was unable to tell me if he has been taking all of his prescribed medications, or if he ever completed his course of oral  Keflex from the last admission.   Review Of Systems: Per HPI  ROS  Patient Active Problem List   Diagnosis Date Noted  . AKI (acute kidney injury) (HCC) 01/11/2016  . DVT (deep venous thrombosis) (HCC) 01/11/2016  . Dehydration   . Acute cystitis without hematuria   . Pain in both lower extremities   . Acute renal failure superimposed on chronic kidney disease (HCC)   . UTI (urinary tract infection) 12/10/2015  . Chronic RLQ pain 09/10/2014  . Chest pain 01/11/2014  . Chest pain at rest 01/08/2014  . Unstable angina (HCC) 07/15/2013  . Abdominal pain, unspecified site 06/02/2012  . Schatzki's ring - nonobstructive 05/31/2012  . Dementia, senile 05/20/2012  . Constipation, chronic 01/28/2012  . Esophageal stricture 01/28/2012  . Paraesophageal sliding hiatal hernia 01/28/2012  . Anticoagulated on warfarin 01/28/2012  . DJD (degenerative joint disease) of knee 09/19/2011  . GERD (gastroesophageal reflux disease) 09/19/2011  . CKD (chronic kidney disease) stage 3, GFR 30-59 ml/min 08/08/2011  . BPH (benign prostatic hyperplasia) 08/08/2011  . Type II or unspecified type diabetes mellitus with renal manifestations, uncontrolled(250.42) 03/21/2006  . HYPERTENSION, BENIGN SYSTEMIC 03/21/2006  . CLAUDICATION, INTERMITTENT 03/21/2006  . RHINITIS, ALLERGIC 03/21/2006    Past Medical History: Past Medical History:  Diagnosis Date  . Anginal pain (HCC)   . Anxiety   . Arthritis    "mostly in my knees and shoulders"  . Asthma    "touch"  . Chronic abdominal pain   . Chronic back pain   . Collapsed lung    "for 14 years" right   . Complication of anesthesia   . Coronary artery disease   . Depression   . Diabetes mellitus   . DVT, lower extremity (HCC) 1980's   LLE  . Esophageal stricture   . GERD (gastroesophageal reflux disease)   . Glaucoma   . Gout    no problems in 2 years  . Hearing loss   . Hemorrhoids   . Hiatal hernia   . Hyperlipemia   . Hypertension   .  Hypothyroidism   . Myocardial infarction 1987; 1989  . Paroxysmal atrial fibrillation (HCC)   . Pneumonia 1970's  . PONV (postoperative nausea and vomiting)   . Pulmonary embolism, bilateral (HCC) 2010  . Renal disorder ~ 03/2011   "wasn't functioning right"  . Schatzki's ring   . Stroke [facility]    "just a little one"    Past Surgical History: Past Surgical History:  Procedure Laterality Date  . APPENDECTOMY  1950  . CARDIAC CATHETERIZATION    . CATARACT EXTRACTION W/ INTRAOCULAR LENS  IMPLANT, BILATERAL Bilateral 1990's  . COLONOSCOPY N/A 05/01/2012   Procedure: COLONOSCOPY;  Surgeon: Theda Belfast, MD;  Location: [facility] ENDOSCOPY;  Service: Endoscopy;  Laterality: N/A;  . CORONARY ANGIOPLASTY WITH STENT PLACEMENT     "think I have 4 total"  . ESOPHAGOGASTRODUODENOSCOPY  12/22/2010   Procedure: ESOPHAGOGASTRODUODENOSCOPY (EGD);  Surgeon: Theda Belfast;  Location: [facility] ENDOSCOPY;  Service: Endoscopy;  Laterality: N/A;  . ESOPHAGOGASTRODUODENOSCOPY N/A 04/30/2012   Procedure: ESOPHAGOGASTRODUODENOSCOPY (EGD);  Surgeon: Theda Belfast, MD;  Location: [facility] ENDOSCOPY;  Service: Endoscopy;  Laterality: N/A;  . HERNIA REPAIR    .  INGUINAL HERNIA REPAIR Bilateral 03/28/2012   Procedure: LAPAROSCOPIC BILATERAL INGUINAL HERNIA REPAIR;  Surgeon: Ardeth SportsmanSteven C. Gross, MD;  Location: [facility] ORS;  Service: General;  Laterality: Bilateral;  . INSERTION OF MESH Bilateral 03/28/2012   Procedure: INSERTION OF MESH;  Surgeon: Ardeth SportsmanSteven C. Gross, MD;  Location: [facility] ORS;  Service: General;  Laterality: Bilateral;  . LEFT HEART CATHETERIZATION WITH CORONARY ANGIOGRAM N/A 02/19/2011   Procedure: LEFT HEART CATHETERIZATION WITH CORONARY ANGIOGRAM;  Surgeon: Robynn PaneMohan N Harwani, MD;  [facility];  Service: Cardiovascular;  Laterality: N/A;    Social History: Social History  Substance Use Topics  . Smoking status: Former Smoker    Packs/day: 2.00    Years: 3.00    Types: Cigarettes    Start date: 01/27/1946    Quit date:  01/22/1981  . Smokeless tobacco: Never Used  . Alcohol use No     Comment: 07/12/2011; "last drink of alcohol was ~ 1983"   Additional social history: none  Please also refer to relevant sections of EMR.  Family History: Family History  Problem Relation Age of Onset  . Diabetes Mother   . Heart disease Father   . Stroke Father   . Lung cancer Brother   . Hypertension Neg Hx   . Hyperlipidemia Neg Hx   . Alcohol abuse Neg Hx   . Colon cancer Neg Hx     Allergies and Medications: No Known Allergies No current facility-administered medications on file prior to encounter.    Current Outpatient Prescriptions on File Prior to Encounter  Medication Sig Dispense Refill  . acetaminophen (TYLENOL) 500 MG tablet Take 500-1,000 mg by mouth every 6 (six) hours as needed for headache (pain).    Marland Kitchen. amiodarone (PACERONE) 200 MG tablet Take 200 mg by mouth daily.     . carvedilol (COREG) 3.125 MG tablet Take 2 tablets (6.25 mg total) by mouth 2 (two) times daily with a meal. 8am, 6pm 60 tablet 1  . divalproex (DEPAKOTE) 125 MG DR tablet Take 125 mg by mouth 2 (two) times daily.    . furosemide (LASIX) 40 MG tablet Take 80 mg by mouth 2 (two) times daily.     . isosorbide mononitrate (IMDUR) 60 MG 24 hr tablet Take 60 mg by mouth daily.    Marland Kitchen. latanoprost (XALATAN) 0.005 % ophthalmic solution Place 1 drop into both eyes at bedtime.    Marland Kitchen. levothyroxine (SYNTHROID, LEVOTHROID) 75 MCG tablet Take 75 mcg by mouth daily before breakfast.    . nitroGLYCERIN (NITROSTAT) 0.4 MG SL tablet Place 1 tablet (0.4 mg total) under the tongue every 5 (five) minutes as needed for chest pain. 30 tablet 0  . omeprazole (PRILOSEC) 20 MG capsule Take 20 mg by mouth daily.    . ondansetron (ZOFRAN ODT) 4 MG disintegrating tablet 4mg  ODT q4 hours prn nausea/vomit 15 tablet 0  . tamsulosin (FLOMAX) 0.4 MG CAPS capsule Take 0.8 mg by mouth at bedtime.       Objective: BP (!) 176/64 (BP Location: Right Arm)   Pulse 63    Temp 97.9 F (36.6 C) (Oral)   Resp 20   Ht 5\' 9"  (1.753 m)   Wt 161 lb 3.2 oz (73.1 kg)   SpO2 98%   BMI 23.81 kg/m  Exam: General: Elderly man, sitting in bed, NAD Eyes: Catract in right significantly distorting view of pupil, left eye pupil equal reactive  ENTM: dry mucous membranes  Neck: No lymphadenopathy  Cardiovascular: RRR, no murmurs  Respiratory: CTAB, limited effort per patient no increased WOB  Gastrointestinal: SNTND, BS present, no CVA tenderness  Derm: Dry skin, no ulcers noted on buttock/saccrum or feet Neuro: Upper and lower extremities intact, sensation intact, CN2-12 intact, no asterixis  Psych: Alert to self and place, not time. Speech muffled.  Labs and Imaging: CBC BMET   Recent Labs Lab 01/11/16 1641  WBC 12.6*  HGB 11.4*  HCT 33.0*  PLT 271    Recent Labs Lab 01/11/16 1641  NA 138  K 3.3*  CL 99*  CO2 28  BUN 91*  CREATININE 4.51*  GLUCOSE 134*  CALCIUM 8.8*      Kathee DeltonIan D Tyrone Pautsch, MD 01/11/2016, 10:32 PM PGY-3, [place] [facility] FPTS Intern pager: [phone], text pages welcome

## 2016-01-11 NOTE — Progress Notes (Signed)
Pt arrived around 2215 alert and oriented will continue to monitor.

## 2016-01-11 NOTE — Progress Notes (Signed)
Preliminary results by tech - Venous Duplex Lower Ext. Completed. Right leg, positive for acute deep vein thrombosis involving the peroneal veins. All other veins are patent without evidence of thrombus. Results given to patient's nurse, [place]. Marilynne Halstedita Tanaisha Pittman, BS, RDMS, RVT

## 2016-01-12 ENCOUNTER — Encounter (HOSPITAL_COMMUNITY): Payer: Self-pay | Admitting: General Practice

## 2016-01-12 DIAGNOSIS — Z79899 Other long term (current) drug therapy: Secondary | ICD-10-CM | POA: Diagnosis not present

## 2016-01-12 DIAGNOSIS — Z8249 Family history of ischemic heart disease and other diseases of the circulatory system: Secondary | ICD-10-CM | POA: Diagnosis not present

## 2016-01-12 DIAGNOSIS — Z87891 Personal history of nicotine dependence: Secondary | ICD-10-CM | POA: Diagnosis not present

## 2016-01-12 DIAGNOSIS — Z8673 Personal history of transient ischemic attack (TIA), and cerebral infarction without residual deficits: Secondary | ICD-10-CM | POA: Diagnosis not present

## 2016-01-12 DIAGNOSIS — I82491 Acute embolism and thrombosis of other specified deep vein of right lower extremity: Secondary | ICD-10-CM | POA: Diagnosis present

## 2016-01-12 DIAGNOSIS — I480 Paroxysmal atrial fibrillation: Secondary | ICD-10-CM | POA: Diagnosis present

## 2016-01-12 DIAGNOSIS — E860 Dehydration: Secondary | ICD-10-CM | POA: Diagnosis present

## 2016-01-12 DIAGNOSIS — Z86711 Personal history of pulmonary embolism: Secondary | ICD-10-CM | POA: Diagnosis not present

## 2016-01-12 DIAGNOSIS — N183 Chronic kidney disease, stage 3 (moderate): Secondary | ICD-10-CM | POA: Diagnosis present

## 2016-01-12 DIAGNOSIS — I5032 Chronic diastolic (congestive) heart failure: Secondary | ICD-10-CM | POA: Diagnosis present

## 2016-01-12 DIAGNOSIS — Z7901 Long term (current) use of anticoagulants: Secondary | ICD-10-CM | POA: Diagnosis not present

## 2016-01-12 DIAGNOSIS — I82401 Acute embolism and thrombosis of unspecified deep veins of right lower extremity: Secondary | ICD-10-CM | POA: Diagnosis not present

## 2016-01-12 DIAGNOSIS — F0390 Unspecified dementia without behavioral disturbance: Secondary | ICD-10-CM | POA: Diagnosis present

## 2016-01-12 DIAGNOSIS — N400 Enlarged prostate without lower urinary tract symptoms: Secondary | ICD-10-CM | POA: Diagnosis present

## 2016-01-12 DIAGNOSIS — I824Y9 Acute embolism and thrombosis of unspecified deep veins of unspecified proximal lower extremity: Secondary | ICD-10-CM | POA: Diagnosis not present

## 2016-01-12 DIAGNOSIS — E1122 Type 2 diabetes mellitus with diabetic chronic kidney disease: Secondary | ICD-10-CM | POA: Diagnosis present

## 2016-01-12 DIAGNOSIS — E039 Hypothyroidism, unspecified: Secondary | ICD-10-CM | POA: Diagnosis present

## 2016-01-12 DIAGNOSIS — N179 Acute kidney failure, unspecified: Secondary | ICD-10-CM | POA: Diagnosis present

## 2016-01-12 DIAGNOSIS — M25512 Pain in left shoulder: Secondary | ICD-10-CM | POA: Diagnosis not present

## 2016-01-12 DIAGNOSIS — K219 Gastro-esophageal reflux disease without esophagitis: Secondary | ICD-10-CM | POA: Diagnosis present

## 2016-01-12 DIAGNOSIS — F419 Anxiety disorder, unspecified: Secondary | ICD-10-CM | POA: Diagnosis present

## 2016-01-12 DIAGNOSIS — I130 Hypertensive heart and chronic kidney disease with heart failure and stage 1 through stage 4 chronic kidney disease, or unspecified chronic kidney disease: Secondary | ICD-10-CM | POA: Diagnosis present

## 2016-01-12 DIAGNOSIS — Z9889 Other specified postprocedural states: Secondary | ICD-10-CM | POA: Diagnosis not present

## 2016-01-12 DIAGNOSIS — I2510 Atherosclerotic heart disease of native coronary artery without angina pectoris: Secondary | ICD-10-CM | POA: Diagnosis present

## 2016-01-12 DIAGNOSIS — Z86718 Personal history of other venous thrombosis and embolism: Secondary | ICD-10-CM | POA: Diagnosis not present

## 2016-01-12 DIAGNOSIS — Z833 Family history of diabetes mellitus: Secondary | ICD-10-CM | POA: Diagnosis not present

## 2016-01-12 DIAGNOSIS — M6281 Muscle weakness (generalized): Secondary | ICD-10-CM | POA: Diagnosis not present

## 2016-01-12 DIAGNOSIS — Z961 Presence of intraocular lens: Secondary | ICD-10-CM | POA: Diagnosis present

## 2016-01-12 LAB — GLUCOSE, CAPILLARY
GLUCOSE-CAPILLARY: 116 mg/dL — AB (ref 65–99)
GLUCOSE-CAPILLARY: 93 mg/dL (ref 65–99)
GLUCOSE-CAPILLARY: 87 mg/dL (ref 65–99)
Glucose-Capillary: 96 mg/dL (ref 65–99)

## 2016-01-12 LAB — CBC
HCT: 35 % — ABNORMAL LOW (ref 39.0–52.0)
Hemoglobin: 12.1 g/dL — ABNORMAL LOW (ref 13.0–17.0)
MCH: 26.4 pg (ref 26.0–34.0)
MCHC: 34.6 g/dL (ref 30.0–36.0)
MCV: 76.3 fL — AB (ref 78.0–100.0)
PLATELETS: 234 10*3/uL (ref 150–400)
RBC: 4.59 MIL/uL (ref 4.22–5.81)
RDW: 15.5 % (ref 11.5–15.5)
WBC: 8.8 10*3/uL (ref 4.0–10.5)

## 2016-01-12 LAB — BASIC METABOLIC PANEL
Anion gap: 9 (ref 5–15)
BUN: 75 mg/dL — AB (ref 6–20)
CHLORIDE: 109 mmol/L (ref 101–111)
CO2: 22 mmol/L (ref 22–32)
Calcium: 8.6 mg/dL — ABNORMAL LOW (ref 8.9–10.3)
Creatinine, Ser: 3.19 mg/dL — ABNORMAL HIGH (ref 0.61–1.24)
GFR calc non Af Amer: 16 mL/min — ABNORMAL LOW (ref >60)
GFR, EST AFRICAN AMERICAN: 19 mL/min — AB (ref >60)
Glucose, Bld: 99 mg/dL (ref 65–99)
POTASSIUM: 3.7 mmol/L (ref 3.5–5.1)
Sodium: 140 mmol/L (ref 135–145)

## 2016-01-12 LAB — PROTIME-INR
INR: 2.93
PROTHROMBIN TIME: 31.2 s — AB (ref 11.4–15.2)

## 2016-01-12 MED ORDER — WARFARIN SODIUM 4 MG PO TABS
4.0000 mg | ORAL_TABLET | Freq: Once | ORAL | Status: AC
  Administered 2016-01-12: 4 mg via ORAL
  Filled 2016-01-12: qty 1

## 2016-01-12 MED ORDER — SODIUM CHLORIDE 0.9 % IV SOLN
INTRAVENOUS | Status: DC
  Administered 2016-01-12: 15:00:00 via INTRAVENOUS
  Administered 2016-01-13: 1000 mL via INTRAVENOUS

## 2016-01-12 MED ORDER — WARFARIN - PHARMACIST DOSING INPATIENT
Freq: Every day | Status: DC
  Administered 2016-01-16: 18:00:00

## 2016-01-12 MED ORDER — HYDRALAZINE HCL 20 MG/ML IJ SOLN
10.0000 mg | Freq: Four times a day (QID) | INTRAMUSCULAR | Status: DC | PRN
  Administered 2016-01-13 – 2016-01-14 (×2): 10 mg via INTRAVENOUS
  Filled 2016-01-12 (×2): qty 1

## 2016-01-12 MED ORDER — SODIUM CHLORIDE 0.9 % IV SOLN: INTRAVENOUS | Status: DC

## 2016-01-12 NOTE — Care Management Note (Signed)
Case Management Note  Patient Details  Name: [name] MRN: [id number] Date of Birth: 1929/07/27  Subjective/Objective:    Per chart patient is from home with his son. [facility] He is in with AKI and found to have DVT.                 Action/Plan: Awaiting PT/OT recommendations. CM talked with the patient but seemed confused and irritable. CM asked if I could call his son and he gave permission. CM was unable to reach patients son. Message left on his voice mail. CM following.  Expected Discharge Date:                  Expected Discharge Plan:  Skilled Nursing Facility  In-House Referral:     Discharge planning Services     Post Acute Care Choice:    Choice offered to:     DME Arranged:    DME Agency:     HH Arranged:    HH Agency:     Status of Service:  In process, will continue to follow  If discussed at Long Length of Stay Meetings, dates discussed:    Additional Comments:  Kermit BaloKelli F Toma Erichsen, RN 01/12/2016, 3:31 PM

## 2016-01-12 NOTE — Progress Notes (Signed)
OT Cancellation Note  Patient Details Name: [name] MRN: [id number] DOB: [birth year]   Cancelled Treatment:    Reason Eval/Treat Not Completed: Medical issues which prohibited therapy. Per conversation with PT, pt with DVT and will hold therapy evals today per RN. Will continue to follow and complete OT eval when medically appropriate.  [name] OTR/L Pager: [phone]  01/12/2016, 12:24 PM

## 2016-01-12 NOTE — NC FL2 (Signed)
[place] MEDICAID FL2 LEVEL OF CARE SCREENING TOOL     IDENTIFICATION  Patient Name: [name] Birthdate: [birth year] Sex: male Admission Date (Current Location): 01/11/2016  [facility] [place]   Facility and Address:  [facility], [address], [place], [place] [address]      Provider Number: [id number]  Attending Physician Name and Address:  Carney LivingMarshall L Chambliss, MD  Relative Name and Phone Number:       Current Level of Care: Hospital Recommended Level of Care: Skilled Nursing Facility Prior Approval Number:    Date Approved/Denied:   PASRR Number: [id number]  Discharge Plan: SNF    Current Diagnoses: Patient Active Problem List   Diagnosis Date Noted  . AKI (acute kidney injury) (HCC) 01/11/2016  . DVT (deep venous thrombosis) (HCC) 01/11/2016  . Dehydration   . Acute cystitis without hematuria   . Pain in both lower extremities   . Acute renal failure superimposed on chronic kidney disease (HCC)   . UTI (urinary tract infection) 12/10/2015  . Chronic RLQ pain 09/10/2014  . Chest pain 01/11/2014  . Chest pain at rest 01/08/2014  . Unstable angina (HCC) 07/15/2013  . Abdominal pain, unspecified site 06/02/2012  . Schatzki's ring - nonobstructive 05/31/2012  . Dementia, senile 05/20/2012  . Constipation, chronic 01/28/2012  . Esophageal stricture 01/28/2012  . Paraesophageal sliding hiatal hernia 01/28/2012  . Anticoagulated on warfarin 01/28/2012  . DJD (degenerative joint disease) of knee 09/19/2011  . GERD (gastroesophageal reflux disease) 09/19/2011  . CKD (chronic kidney disease) stage 3, GFR 30-59 ml/min 08/08/2011  . BPH (benign prostatic hyperplasia) 08/08/2011  . Type II or unspecified type diabetes mellitus with renal manifestations, uncontrolled(250.42) 03/21/2006  . HYPERTENSION, BENIGN SYSTEMIC 03/21/2006  . CLAUDICATION, INTERMITTENT 03/21/2006  . RHINITIS, ALLERGIC 03/21/2006     Orientation RESPIRATION BLADDER Height & Weight     Self, Place  Normal Continent Weight: 161 lb 3.2 oz (73.1 kg) Height:  5\' 9"  (175.3 cm)  BEHAVIORAL SYMPTOMS/MOOD NEUROLOGICAL BOWEL NUTRITION STATUS      Continent Diet (Carb Modified, Thin Liquids)  AMBULATORY STATUS COMMUNICATION OF NEEDS Skin   Extensive Assist Verbally Normal                       Personal Care Assistance Level of Assistance  Bathing, Dressing, Feeding Bathing Assistance: Limited assistance Feeding assistance: Independent Dressing Assistance: Limited assistance     Functional Limitations Info  Sight, Hearing, Speech Sight Info: Adequate Hearing Info: Adequate Speech Info: Adequate    SPECIAL CARE FACTORS FREQUENCY  PT (By licensed PT), OT (By licensed OT), Speech therapy     PT Frequency: 5 OT Frequency: 5     Speech Therapy Frequency: 5      Contractures Contractures Info: Not present    Additional Factors Info  Code Status, Allergies, Psychotropic Code Status Info: Full Code Allergies Info: No known allergies Psychotropic Info: Depakote         Current Medications (01/12/2016):  This is the current hospital active medication list Current Facility-Administered Medications  Medication Dose Route Frequency Provider Last Rate Last Dose  . 0.9 %  sodium chloride infusion   Intravenous Continuous Araceli Bouchealeigh N Rumley, DO 10 mL/hr at 01/12/16 0952 10 mL/hr at 01/12/16 0952  . acetaminophen (TYLENOL) tablet 500-1,000 mg  500-1,000 mg Oral Q6H PRN Kathee DeltonIan D McKeag, MD   500 mg at 01/12/16 1031  . amiodarone (PACERONE) tablet 200 mg  200 mg Oral Daily Kathee DeltonIan D McKeag, MD   200 mg at 01/12/16 1027  . carvedilol (COREG) tablet 6.25 mg  6.25 mg Oral BID WC Kathee DeltonIan D McKeag, MD   6.25 mg at 01/12/16 [id number]  . cephALEXin (KEFLEX) capsule 250 mg  250 mg Oral Q12H Kathee DeltonIan D McKeag, MD   250 mg at 01/12/16 1027  . divalproex (DEPAKOTE) DR tablet 125 mg  125 mg Oral BID Kathee DeltonIan D McKeag, MD   125 mg at 01/12/16 1031  .  hydrALAZINE (APRESOLINE) injection 10 mg  10 mg Intravenous Q6H PRN Coloma N Rumley, DO      . insulin aspart (novoLOG) injection 0-9 Units  0-9 Units Subcutaneous TID WC Kathee DeltonIan D McKeag, MD      . isosorbide mononitrate (IMDUR) 24 hr tablet 60 mg  60 mg Oral Daily Kathee DeltonIan D McKeag, MD   60 mg at 01/12/16 1027  . latanoprost (XALATAN) 0.005 % ophthalmic solution 1 drop  1 drop Both Eyes QHS Kathee DeltonIan D McKeag, MD   1 drop at 01/11/16 2330  . levothyroxine (SYNTHROID, LEVOTHROID) tablet 75 mcg  75 mcg Oral QAC breakfast Kathee DeltonIan D McKeag, MD   75 mcg at 01/12/16 [id number]  . nitroGLYCERIN (NITROSTAT) SL tablet 0.4 mg  0.4 mg Sublingual Q5 min PRN Kathee DeltonIan D McKeag, MD      . pantoprazole (PROTONIX) EC tablet 40 mg  40 mg Oral Daily Kathee DeltonIan D McKeag, MD   40 mg at 01/12/16 1026  . polyethylene glycol (MIRALAX / GLYCOLAX) packet 17 g  17 g Oral Daily PRN Kathee DeltonIan D McKeag, MD      . tamsulosin [facility](FLOMAX) capsule 0.8 mg  0.8 mg Oral QHS Kathee DeltonIan D McKeag, MD   0.8 mg at 01/11/16 2329  . warfarin (COUMADIN) tablet 4 mg  4 mg Oral ONCE-1800 Carney LivingMarshall L Chambliss, MD      . Warfarin - Pharmacist Dosing Inpatient   Does not apply q1800 Carney LivingMarshall L Chambliss, MD         Discharge Medications: Please see discharge summary for a list of discharge medications.  Relevant Imaging Results:  Relevant Lab Results:   Additional Information SSN:  [id number]  Dede QuerySarah Matt Delpizzo, LCSW

## 2016-01-12 NOTE — Progress Notes (Signed)
Patient started on IV fluids @75cc /hr  for acute kidney injury. Baseline Cr 1.8-2.0 and was elevated to 3.18 this AM.   Reuel Boomaniel L. Myrtie SomanWarden, MD [facility] Resident PGY-1 01/12/2016 7:42 PM

## 2016-01-12 NOTE — Discharge Instructions (Addendum)

## 2016-01-12 NOTE — Progress Notes (Signed)
ANTICOAGULATION CONSULT NOTE  Pharmacy Consult for warfarin Indication: Hx Afib/DVT  No Known Allergies   Vital Signs: Temp: 97.8 F (36.6 C) (12/21 0900) Temp Source: Oral (12/21 0900) BP: 162/66 (12/21 0900) Pulse Rate: 64 (12/21 0900)  Labs:  Recent Labs  01/11/16 1641 01/12/16 0709 01/12/16 0850  HGB 11.4* 12.1*  --   HCT 33.0* 35.0*  --   PLT 271 234  --   LABPROT 31.1* 31.2*  --   INR 2.92 2.93  --   CREATININE 4.51*  --  3.19*    Estimated Creatinine Clearance: 17.2 mL/min (by C-G formula based on SCr of 3.19 mg/dL (H)).   Assessment 82yo M on warfarin PTA for Hx Afib/DVT. INR therapeutic at 2.93, CBC stable, no s/sx of bleeding documented.  DDIs: started on Keflex for UTI, PTA amiodarone  PTA warfarin regimen: 2 mg on Sundays and 4 mg all other days.   Goal of Therapy:  INR 2-3 Monitor platelets by anticoagulation protocol: Yes   Plan:  Warfarin 4 mg PO tonight Daily INR, monitor CBC   Mackie Paienee Ackley, PharmD PGY1 Pharmacy Resident Pager: [phone] 01/12/2016 11:26 AM

## 2016-01-12 NOTE — Progress Notes (Signed)
Family Medicine Teaching Service Daily Progress Note Intern Pager: [phone]  Patient name: Blake LaudJames Lanphear Medical record number: [id number] Date of birth: 30-Jul-1929 Age: 82 y.o. Gender: male  Primary Care Provider: Willow OraANDY,CAMILLE L, MD Consultants: Hematology Code Status: Full  Assessment and Plan: 82 y.o.malepresenting with right leg pain. PMH is significant for recent hospital admission for UTI, T2DM, HTN, Esophageal stricture, Dementia, CKD stage 3, BPH, CAD, PAF, Hx of DVTs( 1980s), hx of stroke  # Right Peroneal DVT:INR 2.92  - Hematology consulted, Appreciate recommendations: continued warfarin for DVT treatment. Not an ideal candidate for IVC filter. Suspect secondary to immobility. - Warfarin per pharmacy - Monitor CBC, INR/PT - PT/OT - SW consult for possible SNF placement  # Dysuria:RecentE coli on urine culture (pan sensitive), patient unsure if he completed course of antibiotics. UA positive for hemoglobin, large leukocytes. Consider etiology to be 2/2 BPH and associated urinary retention. - Follow up urine culture - Continue Keflex (12/20>>) - Consider Post Void Residual - Continue home Flomax - Strict I&O  # Acute on Chronic Kidney Disease stage 3:Baseline SCr 1.8 -2.0. Creatinine 4.5 at admission.  - BMP pending - Transition from NS@125cc /hr to [facility] - Watch UOP for signs of oliguria. Consider post void residual.  - Holding home Lasix  # HFpEF/CAD:Last echo with ejection fraction of 50-55% (2015).  - ContinueCoreg 6.25 BID and Imdur - Hold Lasix. Note fluids since yesterday at 125cc/hr, [facility] to [facility] today.  # T2DM: - Sensitive SSI  # AOZ:HYQMVHTN:Blood pressure elevated on admission  - IMDUR 60 mg, Coreg 6.25 BID, Amiodarone 200mg  - Hydralazine as needed - Monitor BP  FEN/GI: PPI Prophylaxis: Wafarin   Disposition: SNF if days available  Subjective:  Denies dysuria. Does note diffuse abdominal pain. Reports normal bowel movements. Reports  bilateral leg pain, worse over right lower extremity.   Objective: Temp:  [97.5 F (36.4 C)-98.6 F (37 C)] 98.2 F (36.8 C) (12/21 0630) Pulse Rate:  [53-66] 61 (12/21 0630) Resp:  [18-20] 18 (12/21 0630) BP: (122-180)/(60-80) 180/62 (12/21 0630) SpO2:  [97 %-100 %] 97 % (12/21 0630) Weight:  [161 lb 3.2 oz (73.1 kg)] 161 lb 3.2 oz (73.1 kg) (12/20 2224) Physical Exam: General: 82yo male resting comfortably in no apparent distress Cardiovascular: S1 and S2 noted, regular rate and rhythm Respiratory: Mild bibasilar crackles, no increased work of breathing Abdomen: Diffuse tenderness, soft and nondistended, bowel sounds normal Extremities: Right calf tender and larger than left lower extremity  Laboratory:  Recent Labs Lab 01/11/16 1641 01/12/16 0709  WBC 12.6* 8.8  HGB 11.4* 12.1*  HCT 33.0* 35.0*  PLT 271 234    Recent Labs Lab 01/11/16 1641  NA 138  K 3.3*  CL 99*  CO2 28  BUN 91*  CREATININE 4.51*  CALCIUM 8.8*  GLUCOSE 134*  - INR 2.92>2.93 - UA: rare bacteria, hemoglobin small, leukocytes large  [address] Rumley, DO 01/12/2016, 8:59 AM PGY-3, [place] [facility] FPTS Intern pager: [phone], text pages welcome

## 2016-01-12 NOTE — Progress Notes (Signed)
PT Cancellation Note  Patient Details Name: [name] MRN: [id number] DOB: 11/05/1929   Cancelled Treatment:    Reason Eval/Treat Not Completed: Patient not medically ready. Pt with DVT and will hold PT eval today per RN. Will continue to follow and complete PT eval when medically appropriate.   [name] 01/12/2016, 11:29 AM   Conni SlipperLaura [name], PT, DPT Acute Rehabilitation Services Pager: [phone]

## 2016-01-12 NOTE — Progress Notes (Signed)
Transitions of Care Pharmacy Note  Plan:  Educated on warfarin indication/dose/drug drug and food interactions, s/sx stroke, s/sx GI bleed Follow up psych evaluation, follow up with niece regarding questions about medications --------------------------------------------- [name] is an 82 y.o. male who presents with a new DVT. In anticipation of discharge, pharmacy has reviewed this patient's prior to admission medication history, as well as [facility]  Current medication indications, dosing, frequency, and notable side effects reviewed with patient. patient verbalized understanding of current inpatient medication regimen and is aware that the After Visit Summary when presented, will represent the most accurate medication list at discharge.   [name] expressed concerns regarding length of stay. Of note, patient states that his niece takes care of medications at home. He was intermittently oriented to time during our conversation.  Assessment: Understanding of regimen: fair Understanding of indications: poor Potential of compliance: [place] - will need to assess in presence of niece  Barriers to Obtaining Medications: No  Patient instructed to contact inpatient pharmacy team with further questions or concerns if needed.    Time spent preparing for discharge counseling: 15 mins Time spent counseling patient: 15 mins   Thank you for allowing pharmacy to be a part of this patient's care.  Allena Katzaroline E Welles, Pharm.D. PGY1 Pharmacy Resident 12/21/20175:10 PM Pager [phone]

## 2016-01-13 DIAGNOSIS — M25512 Pain in left shoulder: Secondary | ICD-10-CM

## 2016-01-13 LAB — BASIC METABOLIC PANEL
Anion gap: 7 (ref 5–15)
BUN: 60 mg/dL — AB (ref 6–20)
CALCIUM: 8.2 mg/dL — AB (ref 8.9–10.3)
CO2: 24 mmol/L (ref 22–32)
CREATININE: 2.49 mg/dL — AB (ref 0.61–1.24)
Chloride: 112 mmol/L — ABNORMAL HIGH (ref 101–111)
GFR calc Af Amer: 26 mL/min — ABNORMAL LOW (ref >60)
GFR, EST NON AFRICAN AMERICAN: 22 mL/min — AB (ref >60)
GLUCOSE: 157 mg/dL — AB (ref 65–99)
POTASSIUM: 3.4 mmol/L — AB (ref 3.5–5.1)
SODIUM: 143 mmol/L (ref 135–145)

## 2016-01-13 LAB — GLUCOSE, CAPILLARY
GLUCOSE-CAPILLARY: 106 mg/dL — AB (ref 65–99)
Glucose-Capillary: 140 mg/dL — ABNORMAL HIGH (ref 65–99)
Glucose-Capillary: 93 mg/dL (ref 65–99)
Glucose-Capillary: 103 mg/dL — ABNORMAL HIGH (ref 65–99)

## 2016-01-13 LAB — CBC
HCT: 31.8 % — ABNORMAL LOW (ref 39.0–52.0)
Hemoglobin: 10.7 g/dL — ABNORMAL LOW (ref 13.0–17.0)
MCH: 26.2 pg (ref 26.0–34.0)
MCHC: 33.6 g/dL (ref 30.0–36.0)
MCV: 77.8 fL — ABNORMAL LOW (ref 78.0–100.0)
PLATELETS: 260 10*3/uL (ref 150–400)
RBC: 4.09 MIL/uL — ABNORMAL LOW (ref 4.22–5.81)
RDW: 15.7 % — AB (ref 11.5–15.5)
WBC: 6.4 10*3/uL (ref 4.0–10.5)

## 2016-01-13 LAB — PROTIME-INR
INR: 3.59
PROTHROMBIN TIME: 36.7 s — AB (ref 11.4–15.2)

## 2016-01-13 LAB — URINE CULTURE

## 2016-01-13 NOTE — Evaluation (Signed)
Occupational Therapy Evaluation Patient Details Name: Isaac LaudJames Eades MRN: [id number] DOB: 12-Jul-1929 Today's Date: 01/13/2016    History of Present Illness 82 y.o. male presenting with right leg pain. PMH is significant for recent hospital admission for UTI, T2DM, HTN, Esophageal stricture, Dementia, CKD stage 3, BPH, CAD,  PAF, Hx of DVTs( 1980s), hx of stroke.  Found to have L LE DVT and possible UTI.   Clinical Impression   Pt is a poor historian. Per chart, son and aide had been caring for pt in the brief time he was home from SNF. Son is not able to take care of pt. Pt presents with impaired cognition, generalized weakness and poor balance. He requires min to total assist for ADL and moderate assist for mobility. Pt will need SNF placement upon d/c. Will defer OT to SNF.   Follow Up Recommendations  SNF;Supervision/Assistance - 24 hour    Equipment Recommendations       Recommendations for Other Services       Precautions / Restrictions Precautions Precautions: Fall Restrictions Weight Bearing Restrictions: No      Mobility Bed Mobility      General bed mobility comments: pt in chair  Transfers Overall transfer level: Needs assistance Equipment used: Rolling walker (2 wheeled) Transfers: Sit to/from Stand Sit to Stand: Mod assist         General transfer comment: from chair, cues for hand placement, to rise and shift weight over feet    Balance Overall balance assessment: Needs assistance   Sitting balance-Leahy Scale: Good Sitting balance - Comments: able to reach towards feet, though cautious with increased time   Standing balance support: Bilateral upper extremity supported Standing balance-Leahy Scale: Poor Standing balance comment: B UE support necessary                            ADL Overall ADL's : Needs assistance/impaired Eating/Feeding: Set up;Sitting   Grooming: Oral care;Sitting;Minimal assistance   Upper Body Bathing:  Moderate assistance;Sitting   Lower Body Bathing: Sit to/from stand;Total assistance   Upper Body Dressing : Minimal assistance;Sitting   Lower Body Dressing: Sit to/from stand;Total assistance   Toilet Transfer: Moderate assistance;RW;BSC   Toileting- Clothing Manipulation and Hygiene: Total assistance;Sit to/from stand               Vision     Perception     Praxis      Pertinent Vitals/Pain Pain Assessment: Faces Pain Score: 9  Faces Pain Scale: Hurts little more Pain Location: back Pain Descriptors / Indicators: Sore Pain Intervention(s): Repositioned;Monitored during session     Hand Dominance Right   Extremity/Trunk Assessment Upper Extremity Assessment Upper Extremity Assessment: Generalized weakness;LUE deficits/detail LUE Deficits / Details: wears a wrist cock up splint he reports due to gout   Lower Extremity Assessment Lower Extremity Assessment: Defer to PT evaluation       Communication Communication Communication: HOH   Cognition Arousal/Alertness: Awake/alert Behavior During Therapy: Flat affect Overall Cognitive Status: No family/caregiver present to determine baseline cognitive functioning                 General Comments: h/o dementia   General Comments       Exercises       Shoulder Instructions      Home Living Family/patient expects to be discharged to:: Private residence Living Arrangements: Children (son has been staying with pt since home from SNF) Available Help at Discharge:  Family;Personal care attendant;Available PRN/intermittently Type of Home: Apartment Home Access: Level entry     Home Layout: One level     Bathroom Shower/Tub: Producer, television/film/videoWalk-in shower   Bathroom Toilet: Standard     Home Equipment: Walker - 2 wheels;Grab bars - tub/shower;Hand held shower head;Shower seat   Additional Comments: patient is questionable historian, reports initially lives alone, then states son there but he works, then states  has a therapy lady that comes daily (has aide)      Prior Functioning/Environment Level of Independence: Needs assistance    ADL's / Homemaking Assistance Needed: has some help 3x per week for several hours per pt   Comments: patient states he is independent, no device, but pt is poor historian        OT Problem List: Decreased strength;Decreased activity tolerance;Impaired balance (sitting and/or standing);Decreased cognition;Decreased safety awareness;Decreased knowledge of use of DME or AE;Pain   OT Treatment/Interventions:      OT Goals(Current goals can be found in the care plan section) Acute Rehab OT Goals Patient Stated Goal: To return home, son not able to assist pt at home  OT Frequency:     Barriers to D/C:            Co-evaluation              End of Session Equipment Utilized During Treatment: Gait belt;Rolling walker  Activity Tolerance: Patient tolerated treatment well Patient left: in chair;with call bell/phone within reach;with chair alarm set   Time: 1315-1334 OT Time Calculation (min): 19 min Charges:  OT General Charges $OT Visit: 1 Procedure OT Evaluation $OT Eval Moderate Complexity: 1 Procedure G-Codes:    Evern BioMayberry, Charmon Thorson Lynn 01/13/2016, 2:23 PM  [phone]

## 2016-01-13 NOTE — Evaluation (Signed)
Physical Therapy Evaluation Patient Details Name: [name] MRN: [id number] DOB: 04/06/1929 Today's Date: 01/13/2016   History of Present Illness  82 y.o. male presenting with right leg pain. PMH is significant for recent hospital admission for UTI, T2DM, HTN, Esophageal stricture, Dementia, CKD stage 3, BPH, CAD,  PAF, Hx of DVTs( 1980s), hx of stroke.  Found to have L LE DVT and possible UTI.  Clinical Impression  Patient presents with decreased independence and safety due to weakness, very slow movements, poor motor planning and decreased safety awareness.  Do not feel he is safe to be left alone at home due to fall risk as well as cognitive deficits.  Would need SNF level rehab if doesn't have 24 hour assist at home.     Follow Up Recommendations SNF (unless family can work out 24 hour assist)    Equipment Recommendations  None recommended by PT    Recommendations for Other Services       Precautions / Restrictions Precautions Precautions: Fall Restrictions Weight Bearing Restrictions: No      Mobility  Bed Mobility Overal bed mobility: Needs Assistance Bed Mobility: Supine to Sit     Supine to sit: Mod assist     General bed mobility comments: assist for trunk upright and to scoot to EOB, increased time required and much encouragement for sitting up   Transfers Overall transfer level: Needs assistance Equipment used: Rolling walker (2 wheeled) Transfers: Sit to/from Stand Sit to Stand: Mod assist         General transfer comment: up from EOB with lifting assist and increased time, cues for anterior weight shift and foot position due to pt c/o foot sliding on floor  Ambulation/Gait Ambulation/Gait assistance: Min assist Ambulation Distance (Feet): 20 Feet Assistive device: Rolling walker (2 wheeled) Gait Pattern/deviations: Step-to pattern;Trunk flexed;Shuffle;Decreased stride length Gait velocity: very slow   General Gait Details: assist to maneuver  walker around bed and in space beside bed to get to chair, multimodal cues given, but assist still needed to prevent running into obstacles and for turning to sit in chair    Stairs            Wheelchair Mobility    Modified Rankin (Stroke Patients Only)       Balance Overall balance assessment: Needs assistance   Sitting balance-Leahy Scale: Good Sitting balance - Comments: able to reach towards feet, though cautious with increased time   Standing balance support: Bilateral upper extremity supported Standing balance-Leahy Scale: Poor Standing balance comment: UE support and assist needed esp initially for balance due to leaning back                             Pertinent Vitals/Pain Pain Assessment: 0-10 Pain Score: 9  Pain Location: in LE's with ambulation (but first reported they hurt "a litle") Pain Descriptors / Indicators: Aching;Sore Pain Intervention(s): Monitored during session;Repositioned    Home Living Family/patient expects to be discharged to:: Private residence Living Arrangements: Children;Alone Available Help at Discharge: Family;Personal care attendant;Available PRN/intermittently Type of Home: Apartment Home Access: Level entry     Home Layout: One level Home Equipment: Walker - 2 wheels;Grab bars - tub/shower;Hand held shower head;Shower seat Additional Comments: patient is questionable historian, reports initially lives alone, then states son there but he works, then states has a therapy lady that comes daily (has Engineer, productionaide)    Prior Function  Comments: patient states he is independent, no device, but pt is poor historian     Hand Dominance   Dominant Hand: Right    Extremity/Trunk Assessment   Upper Extremity Assessment Upper Extremity Assessment: Defer to OT evaluation    Lower Extremity Assessment Lower Extremity Assessment: Generalized weakness       Communication   Communication: HOH  Cognition  Arousal/Alertness: Awake/alert Behavior During Therapy: WFL for tasks assessed/performed Overall Cognitive Status: No family/caregiver present to determine baseline cognitive functioning                      General Comments      Exercises     Assessment/Plan    PT Assessment Patient needs continued PT services  PT Problem List Decreased strength;Decreased activity tolerance;Decreased balance;Pain;Decreased knowledge of use of DME;Decreased mobility;Decreased safety awareness;Decreased cognition          PT Treatment Interventions DME instruction;Gait training;Therapeutic exercise;Patient/family education;Therapeutic activities;Balance training;Functional mobility training    PT Goals (Current goals can be found in the Care Plan section)  Acute Rehab PT Goals Patient Stated Goal: To return home PT Goal Formulation: With patient Time For Goal Achievement: 01/20/16 Potential to Achieve Goals: Fair    Frequency Min 3X/week   Barriers to discharge Decreased caregiver support reportedly does not have 24 hour assist at home    Co-evaluation               End of Session Equipment Utilized During Treatment: Gait belt Activity Tolerance: Patient tolerated treatment well Patient left: in chair;with call bell/phone within reach;with chair alarm set           Time: 0925-1005 PT Time Calculation (min) (ACUTE ONLY): 40 min   Charges:     PT Treatments $Gait Training: 8-22 mins $Therapeutic Activity: 23-37 mins   PT G CodesElray [name]:        [name] 01/13/2016, 10:51 AM  [name], PT [phone] 01/13/2016

## 2016-01-13 NOTE — Progress Notes (Signed)
ANTICOAGULATION CONSULT NOTE  Pharmacy Consult for warfarin Indication: Hx Afib/DVT  No Known Allergies   Vital Signs: Temp: 98.1 F (36.7 C) (12/22 0940) Temp Source: Oral (12/22 0940) BP: 126/108 (12/22 0940) Pulse Rate: 57 (12/22 0940)  Labs:  Recent Labs  01/11/16 1641 01/12/16 0709 01/12/16 0850 01/13/16 0310  HGB 11.4* 12.1*  --  10.7*  HCT 33.0* 35.0*  --  31.8*  PLT 271 234  --  260  LABPROT 31.1* 31.2*  --  36.7*  INR 2.92 2.93  --  3.59  CREATININE 4.51*  --  3.19* 2.49*    Estimated Creatinine Clearance: 22.1 mL/min (by C-G formula based on SCr of 2.49 mg/dL (H)).   Assessment 82yo M on warfarin PTA for Hx Afib/DVT. INR on admission therapeutic at 2.93, now INR elevated at 3.59. Increase in INR likely due to addition of Keflex for UTI treatment. Note patient was on amiodarone and Synthroid PTA.  PTA warfarin regimen: 2 mg on Sundays and 4 mg all other days.   Slight drop in Hgb, platelets normal. No bleeding noted.  Goal of Therapy:  INR 2-3 Monitor platelets by anticoagulation protocol: Yes   Plan:  Hold warfarin tonight  Daily INR, monitor CBC  Izael Bessinger D. Selwyn Reason, PharmD, BCPS Clinical Pharmacist Pager: [phone] 01/13/2016 11:02 AM

## 2016-01-13 NOTE — Progress Notes (Signed)
Pt urine is milky and cloudy with odor he is urinating more than previous shift, his condom cath is coming off and he has wet the bed several times overnight. I would estimate another 500 - 600 ml of urine was not captured by condom catheter.

## 2016-01-13 NOTE — Progress Notes (Signed)
Family Medicine Teaching Service Daily Progress Note Intern Pager: [phone]  Patient name: [name] Medical record number: [id number] Date of birth: 08-19-1929 Age: 82 y.o. Gender: male  Primary Care Provider: Willow OraANDY,CAMILLE L, MD Consultants: Hematology Code Status: Full  Assessment and Plan: 82 y.o.malepresenting with right leg pain. PMH is significant for recent hospital admission for UTI, T2DM, HTN, Esophageal stricture, Dementia, CKD stage 3, BPH, CAD, PAF, Hx of DVTs( 1980s), hx of stroke  # Right Peroneal DVT INR 3.59 (12/22). Not an ideal candidate for IVC filter. Has been supra therapeutic in the past. --Continue warfarin for DVT treatment per pharmacy --F/u on CBC, PT/INR --PT/OT --Follow up on hematology consult, appreciate recs  --SW trying to get in touch with family (son) to coordinate care. D/c SNF?   # Dysuria, ongoing RecentE coli on urine culture (pan sensitive), patient unsure if he completed previous course of antibiotics. Patient with good urine output -2 L in the past 24 hr, less concerning for retention. Given turbid urine and symptoms of dysuria, will treat. Will not require post void residual given good urine output overnight. --Continue Keflex 250 mg q6 DAY 3 (start date 12/20) --Consider Post Void Residual --Continue home Flomax --Strict I&O  # Acute on Chronic Kidney Disease stage 3 Baseline SCr 1.8 -2.0. Creatinine 4.5 at admission. This am creatinine 2.49. Good urine output overnight. --Currently on 50 cc/hr NS, will d/c fluid with improving po intake. --Continue to monitor urine output.  --Continue to hold home Lasix.  # HFpEF/CAD, stable  Last echo with ejection fraction of 50-55% (2015).  --ContinueCoreg 6.25 BID --Continue Imdur 60 mg daily --Continue to hold Lasix.  # T2DM --Sensitive SSI  # HTN BP this am 170/61 --Continue imdur 60 mg, Coreg 6.25 BID, Amiodarone 200mg  --Hydralazine 10 mg prn --Continue to monitor  BP  FEN/GI: PPI Prophylaxis: Wafarin   Disposition: SNF if days available  Subjective:  Patient reports diffuse abdominal pain, and increase urination. Patient denies any dysuria.   Objective: Temp:  [95.6 F (35.3 C)-98.3 F (36.8 C)] 98.3 F (36.8 C) (12/22 0658) Pulse Rate:  [50-68] 52 (12/22 0658) Resp:  [16-18] 16 (12/22 0658) BP: (132-172)/(51-82) 170/61 (12/22 0658) SpO2:  [97 %-100 %] 99 % (12/22 0658) Weight:  [159 lb 12.8 oz (72.5 kg)] 159 lb 12.8 oz (72.5 kg) (12/22 0516)   Physical Exam: General: 82yo male resting comfortably in no apparent distress Cardiovascular: S1 and S2 noted, regular rate and rhythm Respiratory: Mild bibasilar crackles, no increased work of breathing, expiratory wheezing noted on exam. Abdomen: Diffuse tenderness, soft and nondistended, bowel sounds normal Extremities: Right calf tender and larger than left lower extremity  Laboratory:  Recent Labs Lab 01/11/16 1641 01/12/16 0709 01/13/16 0310  WBC 12.6* 8.8 6.4  HGB 11.4* 12.1* 10.7*  HCT 33.0* 35.0* 31.8*  PLT 271 234 260    Recent Labs Lab 01/11/16 1641 01/12/16 0850 01/13/16 0310  NA 138 140 143  K 3.3* 3.7 3.4*  CL 99* 109 112*  CO2 28 22 24   BUN 91* 75* 60*  CREATININE 4.51* 3.19* 2.49*  CALCIUM 8.8* 8.6* 8.2*  GLUCOSE 134* 99 157*  - INR 2.92>2.93-->3.59 - UA: rare bacteria, hemoglobin small, leukocytes large  Lovena NeighboursAbdoulaye Richel Millspaugh, MD 01/13/2016, 9:11 AM PGY-3, [facility] FPTS Intern pager: [phone], text pages welcome

## 2016-01-14 LAB — GLUCOSE, CAPILLARY
GLUCOSE-CAPILLARY: 98 mg/dL (ref 65–99)
GLUCOSE-CAPILLARY: 109 mg/dL — AB (ref 65–99)
Glucose-Capillary: 111 mg/dL — ABNORMAL HIGH (ref 65–99)
Glucose-Capillary: 113 mg/dL — ABNORMAL HIGH (ref 65–99)

## 2016-01-14 LAB — CBC
HEMATOCRIT: 31.9 % — AB (ref 39.0–52.0)
HEMOGLOBIN: 10.9 g/dL — AB (ref 13.0–17.0)
MCH: 26.3 pg (ref 26.0–34.0)
MCHC: 34.2 g/dL (ref 30.0–36.0)
MCV: 76.9 fL — ABNORMAL LOW (ref 78.0–100.0)
Platelets: 289 10*3/uL (ref 150–400)
RBC: 4.15 MIL/uL — AB (ref 4.22–5.81)
RDW: 15.8 % — ABNORMAL HIGH (ref 11.5–15.5)
WBC: 8.8 10*3/uL (ref 4.0–10.5)

## 2016-01-14 LAB — BASIC METABOLIC PANEL
Anion gap: 8 (ref 5–15)
BUN: 29 mg/dL — AB (ref 6–20)
CHLORIDE: 113 mmol/L — AB (ref 101–111)
CO2: 21 mmol/L — ABNORMAL LOW (ref 22–32)
Calcium: 8.2 mg/dL — ABNORMAL LOW (ref 8.9–10.3)
Creatinine, Ser: 1.54 mg/dL — ABNORMAL HIGH (ref 0.61–1.24)
GFR calc non Af Amer: 40 mL/min — ABNORMAL LOW (ref >60)
GFR, EST AFRICAN AMERICAN: 46 mL/min — AB (ref >60)
Glucose, Bld: 110 mg/dL — ABNORMAL HIGH (ref 65–99)
POTASSIUM: 3.4 mmol/L — AB (ref 3.5–5.1)
SODIUM: 142 mmol/L (ref 135–145)

## 2016-01-14 LAB — PROTIME-INR
INR: 2.85
PROTHROMBIN TIME: 30.5 s — AB (ref 11.4–15.2)

## 2016-01-14 MED ORDER — WARFARIN SODIUM 2 MG PO TABS
2.0000 mg | ORAL_TABLET | Freq: Once | ORAL | Status: AC
  Administered 2016-01-14: 2 mg via ORAL
  Filled 2016-01-14: qty 1

## 2016-01-14 NOTE — Progress Notes (Signed)
Family Medicine Teaching Service Daily Progress Note Intern Pager: [phone]  Patient name: Blake LaudJames Mcgahee Medical record number: [id number] Date of birth: April 02, 1929 Age: 82 y.o. Gender: male  Primary Care Provider: Willow OraANDY,CAMILLE L, MD Consultants: Hematology Code Status: Full  Assessment and Plan: 82 y.o.malepresenting with right leg pain. PMH is significant for recent hospital admission for UTI, T2DM, HTN, Esophageal stricture, Dementia, CKD stage 3, BPH, CAD, PAF, Hx of DVTs( 1980s), hx of stroke  # Right Peroneal DVT, stable Warfarin was held yesterday per pharmacy recommendation with INR of 3.59.This morning INR is 2.85 and at goal (2-3). Will continue to monitor and optimize dose as needed. Both PT and OT recommend SNF or 24 hr home supervision. --Continue warfarin for DVT treatment per pharmacy --F/u on CBC, PT/INR --Follow up on hematology consult, appreciate recs  --SW trying to get in touch with family (son) to coordinate care. D/c SNF?   # Dysuria, ongoing RecentE coli UTI . Patient with good urine output -750 mL in the past 24 hr, less concerning for retention. Given turbid urine and symptoms of dysuria, will continue treatment. Will not require post void residual given increase UOP. --Continue Keflex 250 mg q6 DAY 4 (start date 12/20) --If symptoms do not improve on current regimen, recollection for culture --Strict I&O  # Acute on Chronic Kidney Disease stage 3 Baseline SCr 1.8 -2.0. On admission, creatinine 4.5. This am (12/23) creatinine is  1.54.  --Discontinue IVF and continue to encourage fluid intake --Will consider restarting home Lasix tomorrow.  # HFpEF/CAD, stable  Last echo with ejection fraction of 50-55% (2015). No overt sign of fluid overload on exam. Will continue current treatment plan. --ContinueCoreg 6.25 BID --Continue Imdur 60 mg daily --Continue to hold Lasix.  # T2DM --Sensitive SSI  #BPH --Continue Tamsulosin 0.8 mg, qhs  #  HTN BP this am 152/86. Will continue to monitor. --Continue imdur 60 mg --Continue Coreg 6.25 BID --Continue Amiodarone 200mg  --Hydralazine 10 mg prn   FEN/GI: Carb modified Diet, protonix Prophylaxis: Wafarin   Disposition: SNF vs Home with 24 hrs supervision.  Subjective:  Patient continue to report diffuse abdominal pain and has increase urination. Patient denies any dysuria but difficult accurately assess given dementia.   Objective: Temp:  [98.2 F (36.8 C)-98.7 F (37.1 C)] 98.5 F (36.9 C) (12/23 0916) Pulse Rate:  [51-78] 68 (12/23 0916) Resp:  [18-20] 20 (12/23 0916) BP: (126-192)/(49-86) 185/73 (12/23 0916) SpO2:  [98 %-100 %] 98 % (12/23 0916)   Physical Exam: General: 82yo male resting comfortably in no apparent distress Cardiovascular: S1 and S2 noted, regular rate and rhythm Respiratory: Mild bibasilar crackles, no increased work of breathing, expiratory wheezing noted on exam. Abdomen: Diffuse tenderness, soft and nondistended, bowel sounds normal Extremities: Right calf tender and larger than left lower extremity  Laboratory:  Recent Labs Lab 01/12/16 0709 01/13/16 0310 01/14/16 0722  WBC 8.8 6.4 8.8  HGB 12.1* 10.7* 10.9*  HCT 35.0* 31.8* 31.9*  PLT 234 260 289    Recent Labs Lab 01/12/16 0850 01/13/16 0310 01/14/16 0722  NA 140 143 142  K 3.7 3.4* 3.4*  CL 109 112* 113*  CO2 22 24 21*  BUN 75* 60* 29*  CREATININE 3.19* 2.49* 1.54*  CALCIUM 8.6* 8.2* 8.2*  GLUCOSE 99 157* 110*  - INR 2.92>2.93-->3.59-->2.85 - UA: rare bacteria, hemoglobin small, leukocytes large  Lovena NeighboursAbdoulaye Dewitte Vannice, MD 01/14/2016, 11:00 AM PGY-1, [place] [facility] FPTS Intern pager: [phone], text pages welcome

## 2016-01-14 NOTE — Progress Notes (Signed)
ANTICOAGULATION CONSULT NOTE  Pharmacy Consult for warfarin Indication: Hx Afib/DVT  No Known Allergies   Vital Signs: Temp: 98.5 F (36.9 C) (12/23 0916) Temp Source: Oral (12/23 0916) BP: 185/73 (12/23 0916) Pulse Rate: 68 (12/23 0916)  Labs:  Recent Labs  01/12/16 0709 01/12/16 0850 01/13/16 0310 01/14/16 0524 01/14/16 0722  HGB 12.1*  --  10.7*  --  10.9*  HCT 35.0*  --  31.8*  --  31.9*  PLT 234  --  260  --  289  LABPROT 31.2*  --  36.7* 30.5*  --   INR 2.93  --  3.59 2.85  --   CREATININE  --  3.19* 2.49*  --  1.54*    Estimated Creatinine Clearance: 35.7 mL/min (by C-G formula based on SCr of 1.54 mg/dL (H)).   Assessment 82yo M on warfarin PTA for Hx Afib/DVT. INR on admission therapeutic at 2.93 and home dose continued, but rose to supratherapeutic level 01/13/16 so warfarin was held 12/22. Today INR is therapeutic at 2.85. Suspect patient is more sensitive to warfarin given poor PO intake per RN and DDI with new keflex started for UTI. Note patient was on amiodarone and Synthroid PTA.  PTA warfarin regimen: 2 mg on Sundays and 4 mg all other days.   Hgb low stable, platelets wnl stable. No bleeding noted.  Goal of Therapy:  INR 2-3 Monitor platelets by anticoagulation protocol: Yes   Plan:  Warfarin 2 mg PO x 1 Daily INR, monitor CBC, s/sx bleeding, PO intake, DDI F/u LOT keflex  Allena Katzaroline E Jazmeen Axtell, Pharm.D. PGY1 Pharmacy Resident 12/23/201711:16 AM Pager [phone]

## 2016-01-14 NOTE — Progress Notes (Signed)
Family Medicine Teaching Service Daily Progress Note Intern Pager: [phone]  Patient name: Blake LaudJames Vidales Medical record number: [id number] Date of birth: 02-Dec-1929 Age: 82 y.o. Gender: male  Primary Care Provider: Willow OraANDY,CAMILLE L, MD Consultants: Hematology Code Status: Full  Assessment and Plan: 82 y.o.malepresenting with right leg pain. PMH is significant for recent hospital admission for UTI, T2DM, HTN, Esophageal stricture, Dementia, CKD stage 3, BPH, CAD, PAF, Hx of DVTs( 1980s), hx of stroke  # Right Peroneal DVT, stable This morning INR is 2.95 and at goal (2-3). Will continue to monitor and optimize dose as needed. Both PT and OT recommend SNF or 24 hr home supervision. --Continue warfarin for DVT treatment per pharmacy --F/u on CBC, PT/INR --hematology consulted from ED: recommended continuing warfarin, would not add ASA as this would triple bleeding risk --SW trying to get in touch with family (son) to coordinate care. D/c to SNF?   # Dysuria, ongoing RecentE coli UTI . Patient with good urine output -750 mL in the past 24 hr, less concerning for retention. Given turbid urine and symptoms of dysuria, will continue treatment. Will not require post void residual given increase UOP. --Continue Keflex 250 mg q6 DAY 5/5 (start date 12/20) --Strict I&O  # Acute on Chronic Kidney Disease stage 3 Baseline SCr 1.8 -2.0. On admission, creatinine 4.5. This am (12/24) creatinine is 1.58.  --continue to encourage fluid intake --holding home lasix, no evidence of fluid overload on my exam  # HFpEF/CAD, stable  Last echo with ejection fraction of 50-55% (2015). No overt sign of fluid overload on exam. Will continue current treatment plan. Coreg held for HR in 50s overnight, HR can tolerate this morning.  --ContinueCoreg 6.25 BID --Continue Imdur 60 mg daily --Continue to hold Lasix.  # T2DM --Sensitive SSI  #BPH --Continue Tamsulosin 0.8 mg, qhs  # HTN BP this am  152/86. Will continue to monitor. --Continue imdur 60 mg --Continue Coreg 6.25 BID --Continue Amiodarone 200mg  --Hydralazine 10 mg prn   FEN/GI: Carb modified Diet, protonix Prophylaxis: Wafarin   Disposition: SNF vs Home with 24 hrs supervision.  Subjective:  Patient complaining of lower abdominal pain. Seems consistent with his history dysuria, had BM 1 day prior.   Objective: Temp:  [98.4 F (36.9 C)-98.7 F (37.1 C)] 98.5 F (36.9 C) (12/23 2110) Pulse Rate:  [56-88] 79 (12/23 2110) Resp:  [18-20] 18 (12/23 2110) BP: (106-192)/(49-86) 183/79 (12/23 2110) SpO2:  [98 %-100 %] 99 % (12/23 2110)   Physical Exam: General: 82yo male resting comfortably in no apparent distress Cardiovascular: S1 and S2 noted, regular rate and rhythm Respiratory: Mild bibasilar crackles, no increased work of breathing, expiratory wheezing noted on exam. Abdomen: Diffuse tenderness, soft and nondistended, bowel sounds normal Extremities: Right calf tender and larger than left lower extremity  Laboratory:  Recent Labs Lab 01/12/16 0709 01/13/16 0310 01/14/16 0722  WBC 8.8 6.4 8.8  HGB 12.1* 10.7* 10.9*  HCT 35.0* 31.8* 31.9*  PLT 234 260 289    Recent Labs Lab 01/12/16 0850 01/13/16 0310 01/14/16 0722  NA 140 143 142  K 3.7 3.4* 3.4*  CL 109 112* 113*  CO2 22 24 21*  BUN 75* 60* 29*  CREATININE 3.19* 2.49* 1.54*  CALCIUM 8.6* 8.2* 8.2*  GLUCOSE 99 157* 110*  - INR 2.92>2.93-->3.59-->2.85 - UA: rare bacteria, hemoglobin small, leukocytes large  Garth BignessKathryn Timberlake, MD 01/14/2016, 9:55 PM PGY-1, [facility] FPTS Intern pager: [phone], text pages welcome

## 2016-01-14 NOTE — Progress Notes (Signed)
Coreg held this PM related to HR, see chart-MD approved. Will continue to monitor

## 2016-01-15 DIAGNOSIS — Z87891 Personal history of nicotine dependence: Secondary | ICD-10-CM

## 2016-01-15 DIAGNOSIS — Z8249 Family history of ischemic heart disease and other diseases of the circulatory system: Secondary | ICD-10-CM

## 2016-01-15 DIAGNOSIS — F0150 Vascular dementia without behavioral disturbance: Secondary | ICD-10-CM

## 2016-01-15 DIAGNOSIS — Z9889 Other specified postprocedural states: Secondary | ICD-10-CM

## 2016-01-15 DIAGNOSIS — Z823 Family history of stroke: Secondary | ICD-10-CM

## 2016-01-15 DIAGNOSIS — M6281 Muscle weakness (generalized): Secondary | ICD-10-CM

## 2016-01-15 DIAGNOSIS — Z794 Long term (current) use of insulin: Secondary | ICD-10-CM

## 2016-01-15 DIAGNOSIS — Z79899 Other long term (current) drug therapy: Secondary | ICD-10-CM

## 2016-01-15 DIAGNOSIS — Z833 Family history of diabetes mellitus: Secondary | ICD-10-CM

## 2016-01-15 DIAGNOSIS — F0390 Unspecified dementia without behavioral disturbance: Secondary | ICD-10-CM

## 2016-01-15 DIAGNOSIS — Z801 Family history of malignant neoplasm of trachea, bronchus and lung: Secondary | ICD-10-CM

## 2016-01-15 LAB — GLUCOSE, CAPILLARY
GLUCOSE-CAPILLARY: 98 mg/dL (ref 65–99)
Glucose-Capillary: 142 mg/dL — ABNORMAL HIGH (ref 65–99)
Glucose-Capillary: 85 mg/dL (ref 65–99)
Glucose-Capillary: 103 mg/dL — ABNORMAL HIGH (ref 65–99)

## 2016-01-15 LAB — BASIC METABOLIC PANEL
Anion gap: 8 (ref 5–15)
BUN: 24 mg/dL — AB (ref 6–20)
CALCIUM: 8.5 mg/dL — AB (ref 8.9–10.3)
CO2: 22 mmol/L (ref 22–32)
CREATININE: 1.58 mg/dL — AB (ref 0.61–1.24)
Chloride: 114 mmol/L — ABNORMAL HIGH (ref 101–111)
GFR, EST AFRICAN AMERICAN: 45 mL/min — AB (ref >60)
GFR, EST NON AFRICAN AMERICAN: 38 mL/min — AB (ref >60)
Glucose, Bld: 105 mg/dL — ABNORMAL HIGH (ref 65–99)
Potassium: 3.3 mmol/L — ABNORMAL LOW (ref 3.5–5.1)
SODIUM: 144 mmol/L (ref 135–145)

## 2016-01-15 LAB — CBC
HCT: 31.9 % — ABNORMAL LOW (ref 39.0–52.0)
HEMOGLOBIN: 10.8 g/dL — AB (ref 13.0–17.0)
MCH: 26 pg (ref 26.0–34.0)
MCHC: 33.9 g/dL (ref 30.0–36.0)
MCV: 76.9 fL — ABNORMAL LOW (ref 78.0–100.0)
PLATELETS: 295 10*3/uL (ref 150–400)
RBC: 4.15 MIL/uL — ABNORMAL LOW (ref 4.22–5.81)
RDW: 15.8 % — AB (ref 11.5–15.5)
WBC: 8.5 10*3/uL (ref 4.0–10.5)

## 2016-01-15 LAB — PROTIME-INR
INR: 2.95
Prothrombin Time: 31.3 seconds — ABNORMAL HIGH (ref 11.4–15.2)

## 2016-01-15 MED ORDER — WARFARIN SODIUM 2 MG PO TABS
2.0000 mg | ORAL_TABLET | Freq: Once | ORAL | Status: AC
  Administered 2016-01-15: 2 mg via ORAL
  Filled 2016-01-15: qty 1

## 2016-01-15 MED ORDER — POTASSIUM CHLORIDE CRYS ER 20 MEQ PO TBCR
40.0000 meq | EXTENDED_RELEASE_TABLET | Freq: Once | ORAL | Status: AC
  Administered 2016-01-15: 40 meq via ORAL
  Filled 2016-01-15: qty 2

## 2016-01-15 MED ORDER — POLYETHYLENE GLYCOL 3350 17 G PO PACK
17.0000 g | PACK | Freq: Every day | ORAL | Status: DC
  Administered 2016-01-16 – 2016-01-18 (×2): 17 g via ORAL
  Filled 2016-01-15 (×2): qty 1

## 2016-01-15 NOTE — Discharge Summary (Signed)
Family Medicine Teaching [facility] Discharge Summary  Patient name: Blake LaudJames Talton Medical record number: [id number] Date of birth: 1929-02-13 Age: 82 y.o. Gender: male Date of Admission: 01/11/2016  Date of Discharge: 01/18/16  Admitting Physician: Carney LivingMarshall L Chambliss, MD  Primary Care Provider: Willow OraANDY,CAMILLE L, MD Consultants: hematology  Indication for Hospitalization: Right leg pain  Discharge Diagnoses/Problem List:  Right peroneal DVT CKD 3 HFpEF/CAD T2DM  Disposition: SNF  Discharge Condition: stable  Discharge Exam:  General: 82yo male sitting comfortably in chair, eating breakfast. in no apparent distress Cardiovascular: S1 and S2 noted, regular rate and rhythm Respiratory: Mild bibasilar crackles, no increased work of breathing, intermittent expiratory wheezing noted on exam. Abdomen: mild generalized tenderness, soft and nondistended, bowel sounds normal Extremities: no palpable cord. 1+ pitting edema in LE b/l with skin changes c/w venous stasis  Brief Hospital Course:  Patient presented from his son's home with chief complaint of R leg swelling. He has a history of DVT/PE (years ago) and is on warfarin. According to the ED, he was evaluated by a home health nurse due to the right leg swelling. Minimally functional at home at baseline, AKI thought to be secondary to inability to get his own food or water while son is at work. Patient was receiving only 8 hours of coverage per day by home health PTA. In ED, found to have R peroneal DVT. Hematology consulted via phone, recommended that patient be maintained on warfarin. Heme recommended against adding aspirin (would 3x his bleeding risk) or placing IVC filter. Patient found to have AKI in ED (baseline Cr 1.8-2), Cr on admit 4.5. Patient with dysuria on presentation, given 5 days of keflex with renal dosing. AKI resolved at time of discharge. Psych was consulted, patient was deemed to lack capacity to make his own medical  decisions. Son was agreeable with discharge to SNF. Patient was monitored while awaiting nursing home placement.   Issues for Follow Up:  1. Continued monitoring of INR, lifelong warfarin. Hematology recommended against adding ASA or IVC filter.  2. Ensure adequate PO hydration, may require feeding supervision given dementia.  3. Patient only required 1 unit of sliding scale insulin while admitted, would not recommend continuing insulin.  4. Watch fluid status. Patient had home med of lasix, but did not appear fluid overloaded at any time during this stay.   Significant Procedures: none  Significant Labs and Imaging:   Recent Labs Lab 01/14/16 0722 01/15/16 0235 01/16/16 0826  WBC 8.8 8.5 9.0  HGB 10.9* 10.8* 11.4*  HCT 31.9* 31.9* 33.8*  PLT 289 295 264    Recent Labs Lab 01/13/16 0310 01/14/16 0722 01/15/16 0235 01/16/16 0826 01/18/16 0530  NA 143 142 144 143 144  K 3.4* 3.4* 3.3* 3.8 4.0  CL 112* 113* 114* 113* 113*  CO2 24 21* 22 22 27   GLUCOSE 157* 110* 105* 121* 102*  BUN 60* 29* 24* 17 14  CREATININE 2.49* 1.54* 1.58* 1.67* 1.60*  CALCIUM 8.2* 8.2* 8.5* 8.5* 8.4*     Results/Tests Pending at Time of Discharge:   Discharge Medications:  Allergies as of 01/18/2016   No Known Allergies     Medication List    STOP taking these medications   furosemide 40 MG tablet Commonly known as:  LASIX     TAKE these medications   acetaminophen 500 MG tablet Commonly known as:  TYLENOL Take 500-1,000 mg by mouth every 6 (six) hours as needed for headache (pain).   amiodarone 200 MG  tablet Commonly known as:  PACERONE Take 200 mg by mouth daily.   carvedilol 3.125 MG tablet Commonly known as:  COREG Take 2 tablets (6.25 mg total) by mouth 2 (two) times daily with a meal. 8am, 6pm   divalproex 125 MG DR tablet Commonly known as:  DEPAKOTE Take 125 mg by mouth 2 (two) times daily.   isosorbide mononitrate 60 MG 24 hr tablet Commonly known as:  IMDUR Take  60 mg by mouth daily.   latanoprost 0.005 % ophthalmic solution Commonly known as:  XALATAN Place 1 drop into both eyes at bedtime.   levothyroxine 75 MCG tablet Commonly known as:  SYNTHROID, LEVOTHROID Take 75 mcg by mouth daily before breakfast.   nitroGLYCERIN 0.4 MG SL tablet Commonly known as:  NITROSTAT Place 1 tablet (0.4 mg total) under the tongue every 5 (five) minutes as needed for chest pain.   omeprazole 20 MG capsule Commonly known as:  PRILOSEC Take 20 mg by mouth daily.   ondansetron 4 MG disintegrating tablet Commonly known as:  ZOFRAN ODT 4mg  ODT q4 hours prn nausea/vomit   tamsulosin 0.4 MG Caps capsule Commonly known as:  FLOMAX Take 0.8 mg by mouth at bedtime.   warfarin 4 MG tablet Commonly known as:  COUMADIN Take 2-4 mg by mouth daily. Take 4 mg Monday-Saturday take 2 mg on Sunday       Discharge Instructions: Please refer to Patient Instructions section of EMR for full details.  Patient was counseled important signs and symptoms that should prompt return to medical care, changes in medications, dietary instructions, activity restrictions, and follow up appointments.   Follow-Up Appointments: Contact information for after-discharge care    Destination    [facility] .   Specialty:  Skilled Nursing Facility Contact information: [address] [place] [phone]              Garth BignessKathryn Charene Mccallister, MD 01/18/2016, 1:40 PM PGY-1, [facility]

## 2016-01-15 NOTE — Consult Note (Signed)
[place] Psychiatry Consult   Reason for Consult:  Capacity evaluation for discharge to home Referring Physician:  Dr. Talbert Cage Patient Identification: [name] MRN:  [id number] Principal Diagnosis: Major neurocognitive disorder Diagnosis:   Patient Active Problem List   Diagnosis Date Noted  . Major neurocognitive disorder [F03.90] 01/15/2016  . Muscle weakness (generalized) [M62.81]   . Left shoulder pain [M25.512]   . Acute deep vein thrombosis (DVT) of right lower extremity ([place]) [I82.401]   . AKI (acute kidney injury) ([place]) [N17.9] 01/11/2016  . DVT (deep venous thrombosis) ([place]) [I82.409] 01/11/2016  . Dehydration [E86.0]   . Acute cystitis without hematuria [N30.00]   . Pain in both lower extremities [M79.604, M79.605]   . Acute renal failure superimposed on chronic kidney disease ([place]) [N17.9, N18.9]   . UTI (urinary tract infection) [N39.0] 12/10/2015  . Chronic RLQ pain [R10.31, G89.29] 09/10/2014  . Chest pain [R07.9] 01/11/2014  . Chest pain at rest [R07.9] 01/08/2014  . Unstable angina ([place]) [I20.0] 07/15/2013  . Abdominal pain, unspecified site [R10.9] 06/02/2012  . Schatzki's ring - nonobstructive [K22.2] 05/31/2012  . Dementia, senile [F03.90] 05/20/2012  . Constipation, chronic [K59.09] 01/28/2012  . Esophageal stricture [K22.2] 01/28/2012  . Paraesophageal sliding hiatal hernia [K44.9] 01/28/2012  . Anticoagulated on warfarin [Z79.01] 01/28/2012  . DJD (degenerative joint disease) of knee [M17.10] 09/19/2011  . GERD (gastroesophageal reflux disease) [K21.9] 09/19/2011  . CKD (chronic kidney disease) stage 3, GFR 30-59 ml/min [N18.3] 08/08/2011  . BPH (benign prostatic hyperplasia) [N40.0] 08/08/2011  . Type II or unspecified type diabetes mellitus with renal manifestations, uncontrolled(250.42) [E11.29, E11.65] 03/21/2006  . HYPERTENSION, BENIGN SYSTEMIC [I10] 03/21/2006  . CLAUDICATION, INTERMITTENT [I73.9] 03/21/2006  . RHINITIS,  ALLERGIC [J30.9] 03/21/2006    Total Time spent with patient: 45 minutes  Subjective/HPI:   [name] is a 82 y.o. male patient with unspecified neurocognitive disorder, T2DM, HTN, Esophageal stricture, Dementia, CKD stage 3, BPH, CAD, PAF, Hx of DVTs( 1980s), hx of stroke, who is admitted for dysuria, treated for prerenal AKI secondary to dehydration. Psychiatry is consulted for capacity evaluation for patient wants to be discharged to home.   - Per nursing report, patient occasionally has memory loss and tends to be irritable. No significant behavioral issues reported.   Patient is interviewed at the bedside.  He states that he is in the hospital due to "clot." Despite that he was informed of his dehydration which led to his admission, later he states that he is here due to "gouch. (gout)." He wants to go back home. He is not able to elaborate the reason for it. When he is asked about SNF, he states that "they don't take care of it..." when he is asked if it means that he does not get good care, he mumbles some words and unable to elaborate it. 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 He denies appetite loss. He denies SI/HI. He denies AH/VH.   Past Psychiatric History:  Outpatient: dx of unspecified neurocognitive disorder Psychiatry admission: denies Previous suicide attempt: denies Past trials of medication: denies History of violence: denies  Risk to Self: Is patient at risk for suicide?: No Risk to Others:   Prior Inpatient Therapy:   Prior Outpatient Therapy:    Past Medical History:  Past Medical History:  Diagnosis Date  . Anginal pain ([place])   . Anxiety   .  Arthritis    "mostly in my knees and shoulders"  . Asthma    "touch"  . Chronic abdominal pain   . Chronic back pain   . Collapsed lung    "for 14 years" right   . Complication of anesthesia   . Coronary  artery disease   . Depression   . Diabetes mellitus   . DVT, lower extremity (HCC) 1980's   LLE  . Esophageal stricture   . GERD (gastroesophageal reflux disease)   . Glaucoma   . Gout    no problems in 2 years  . Hearing loss   . Hemorrhoids   . Hiatal hernia   . Hyperlipemia   . Hypertension   . Hypothyroidism   . Myocardial infarction 1987; 1989  . Paroxysmal atrial fibrillation (HCC)   . Pneumonia 1970's  . PONV (postoperative nausea and vomiting)   . Pulmonary embolism, bilateral ([place]) 2010  . Renal disorder ~ 03/2011   "wasn't functioning right"  . Schatzki's ring   . Stroke [facility])    "just a little one"    Past Surgical History:  Procedure Laterality Date  . APPENDECTOMY  1950  . CARDIAC CATHETERIZATION    . CATARACT EXTRACTION W/ INTRAOCULAR LENS  IMPLANT, BILATERAL Bilateral 1990's  . COLONOSCOPY N/A 05/01/2012   Procedure: COLONOSCOPY;  Surgeon: Beryle Beams, MD;  Location: [facility];  Service: Endoscopy;  Laterality: N/A;  . CORONARY ANGIOPLASTY WITH STENT PLACEMENT     "think I have 4 total"  . ESOPHAGOGASTRODUODENOSCOPY  12/22/2010   Procedure: ESOPHAGOGASTRODUODENOSCOPY (EGD);  Surgeon: Beryle Beams;  Location: [facility] ENDOSCOPY;  Service: Endoscopy;  Laterality: N/A;  . ESOPHAGOGASTRODUODENOSCOPY N/A 04/30/2012   Procedure: ESOPHAGOGASTRODUODENOSCOPY (EGD);  Surgeon: Beryle Beams, MD;  Location[facility] ENDOSCOPY;  Service: Endoscopy;  Laterality: N/A;  . HERNIA REPAIR    . INGUINAL HERNIA REPAIR Bilateral 03/28/2012   Procedure: LAPAROSCOPIC BILATERAL INGUINAL HERNIA REPAIR;  Surgeon: Adin Hector, MD;  Location: [facility] ORS;  Service: General;  Laterality: Bilateral;  . INSERTION OF MESH Bilateral 03/28/2012   Procedure: INSERTION OF MESH;  Surgeon: Adin Hector, MD;  Location: [facility] ORS;  Service: General;  Laterality: Bilateral;  . LEFT HEART CATHETERIZATION WITH CORONARY ANGIOGRAM N/A 02/19/2011   Procedure: LEFT HEART CATHETERIZATION WITH CORONARY ANGIOGRAM;   Surgeon: Clent Demark, MD;  Location: [facility] CATH LAB;  Service: Cardiovascular;  Laterality: N/A;   Family History:  Family History  Problem Relation Age of Onset  . Diabetes Mother   . Heart disease Father   . Stroke Father   . Lung cancer Brother   . Hypertension Neg Hx   . Hyperlipidemia Neg Hx   . Alcohol abuse Neg Hx   . Colon cancer Neg Hx    Family Psychiatric  History: denies Social History:  History  Alcohol Use No    Comment: 07/12/2011; "last drink of alcohol was ~ 1983"     History  Drug Use No    Comment: last in [place] History  . Marital status: Divorced    Spouse name: N/A  . Number of children: 3  . Years of education: N/A   Occupational History  . Retired    Social History Main Topics  . Smoking status: Former Smoker    Packs/day: 2.00    Years: 3.00    Types: Cigarettes    Start date: 01/27/1946    Quit date: 01/22/1981  . Smokeless tobacco:  Never Used  . Alcohol use No     Comment: 07/12/2011; "last drink of alcohol was ~ 1983"  . Drug use: No     Comment: last in 1982  . Sexual activity: No   Other Topics Concern  . None   Social History Narrative  . None   Additional Social History:  Used to live by himself, used to work as a Building control surveyor  Allergies:  No Known Allergies  Labs:  Results for orders placed or performed during the hospital encounter of 01/11/16 (from the past 48 hour(s))  Glucose, capillary     Status: None   Collection Time: 01/13/16  4:08 PM  Result Value Ref Range   Glucose-Capillary 93 65 - 99 mg/dL  Glucose, capillary     Status: Abnormal   Collection Time: 01/13/16  9:03 PM  Result Value Ref Range   Glucose-Capillary 103 (H) 65 - 99 mg/dL   Comment 1 Notify RN    Comment 2 Document in Chart   Protime-INR     Status: Abnormal   Collection Time: 01/14/16  5:24 AM  Result Value Ref Range   Prothrombin Time 30.5 (H) 11.4 - 15.2 seconds   INR 2.85   Glucose, capillary     Status: Abnormal    Collection Time: 01/14/16  6:48 AM  Result Value Ref Range   Glucose-Capillary 111 (H) 65 - 99 mg/dL   Comment 1 Notify RN    Comment 2 Document in Chart   CBC     Status: Abnormal   Collection Time: 01/14/16  7:22 AM  Result Value Ref Range   WBC 8.8 4.0 - 10.5 K/uL   RBC 4.15 (L) 4.22 - 5.81 MIL/uL   Hemoglobin 10.9 (L) 13.0 - 17.0 g/dL   HCT 31.9 (L) 39.0 - 52.0 %   MCV 76.9 (L) 78.0 - 100.0 fL   MCH 26.3 26.0 - 34.0 pg   MCHC 34.2 30.0 - 36.0 g/dL   RDW 15.8 (H) 11.5 - 15.5 %   Platelets 289 150 - 400 K/uL  Basic metabolic panel     Status: Abnormal   Collection Time: 01/14/16  7:22 AM  Result Value Ref Range   Sodium 142 135 - 145 mmol/L   Potassium 3.4 (L) 3.5 - 5.1 mmol/L   Chloride 113 (H) 101 - 111 mmol/L   CO2 21 (L) 22 - 32 mmol/L   Glucose, Bld 110 (H) 65 - 99 mg/dL   BUN 29 (H) 6 - 20 mg/dL   Creatinine, Ser 1.54 (H) 0.61 - 1.24 mg/dL   Calcium 8.2 (L) 8.9 - 10.3 mg/dL   GFR calc non Af Amer 40 (L) >60 mL/min   GFR calc Af Amer 46 (L) >60 mL/min    Comment: (NOTE) The eGFR has been calculated using the CKD EPI equation. This calculation has not been validated in all clinical situations. eGFR's persistently <60 mL/min signify possible Chronic Kidney Disease.    Anion gap 8 5 - 15  Glucose, capillary     Status: None   Collection Time: 01/14/16 11:14 AM  Result Value Ref Range   Glucose-Capillary 98 65 - 99 mg/dL  Glucose, capillary     Status: Abnormal   Collection Time: 01/14/16  4:21 PM  Result Value Ref Range   Glucose-Capillary 109 (H) 65 - 99 mg/dL  Glucose, capillary     Status: Abnormal   Collection Time: 01/14/16  9:09 PM  Result Value Ref Range   Glucose-Capillary 113 (  H) 65 - 99 mg/dL   Comment 1 Notify RN    Comment 2 Document in Chart   Protime-INR     Status: Abnormal   Collection Time: 01/15/16  2:35 AM  Result Value Ref Range   Prothrombin Time 31.3 (H) 11.4 - 15.2 seconds   INR 2.95   CBC     Status: Abnormal   Collection Time:  01/15/16  2:35 AM  Result Value Ref Range   WBC 8.5 4.0 - 10.5 K/uL   RBC 4.15 (L) 4.22 - 5.81 MIL/uL   Hemoglobin 10.8 (L) 13.0 - 17.0 g/dL   HCT 31.9 (L) 39.0 - 52.0 %   MCV 76.9 (L) 78.0 - 100.0 fL   MCH 26.0 26.0 - 34.0 pg   MCHC 33.9 30.0 - 36.0 g/dL   RDW 15.8 (H) 11.5 - 15.5 %   Platelets 295 150 - 400 K/uL  Basic metabolic panel     Status: Abnormal   Collection Time: 01/15/16  2:35 AM  Result Value Ref Range   Sodium 144 135 - 145 mmol/L   Potassium 3.3 (L) 3.5 - 5.1 mmol/L   Chloride 114 (H) 101 - 111 mmol/L   CO2 22 22 - 32 mmol/L   Glucose, Bld 105 (H) 65 - 99 mg/dL   BUN 24 (H) 6 - 20 mg/dL   Creatinine, Ser 1.58 (H) 0.61 - 1.24 mg/dL   Calcium 8.5 (L) 8.9 - 10.3 mg/dL   GFR calc non Af Amer 38 (L) >60 mL/min   GFR calc Af Amer 45 (L) >60 mL/min    Comment: (NOTE) The eGFR has been calculated using the CKD EPI equation. This calculation has not been validated in all clinical situations. eGFR's persistently <60 mL/min signify possible Chronic Kidney Disease.    Anion gap 8 5 - 15  Glucose, capillary     Status: None   Collection Time: 01/15/16  6:09 AM  Result Value Ref Range   Glucose-Capillary 98 65 - 99 mg/dL   Comment 1 Notify RN    Comment 2 Document in Chart   Glucose, capillary     Status: Abnormal   Collection Time: 01/15/16 11:49 AM  Result Value Ref Range   Glucose-Capillary 142 (H) 65 - 99 mg/dL    Current Facility-Administered Medications  Medication Dose Route Frequency Provider Last Rate Last Dose  . 0.9 %  sodium chloride infusion   Intravenous Continuous Macy N Rumley, DO   Stopped at 01/12/16 1500  . acetaminophen (TYLENOL) tablet 500-1,000 mg  500-1,000 mg Oral Q6H PRN Elberta Leatherwood, MD   500 mg at 01/12/16 1031  . amiodarone (PACERONE) tablet 200 mg  200 mg Oral Daily Elberta Leatherwood, MD   200 mg at 01/15/16 7829  . carvedilol (COREG) tablet 6.25 mg  6.25 mg Oral BID WC Elberta Leatherwood, MD   6.25 mg at 01/15/16 5621  . cephALEXin (KEFLEX)  capsule 250 mg  250 mg Oral Q12H Sela Hilding, MD   250 mg at 01/15/16 3086  . divalproex (DEPAKOTE) DR tablet 125 mg  125 mg Oral BID Elberta Leatherwood, MD   125 mg at 01/15/16 5784  . hydrALAZINE (APRESOLINE) injection 10 mg  10 mg Intravenous Q6H PRN Lorna Few, DO   10 mg at 01/14/16 2153  . insulin aspart (novoLOG) injection 0-9 Units  0-9 Units Subcutaneous TID WC Elberta Leatherwood, MD   1 Units at 01/15/16 1214  . isosorbide mononitrate (IMDUR) 24  hr tablet 60 mg  60 mg Oral Daily Elberta Leatherwood, MD   60 mg at 01/15/16 7782  . latanoprost (XALATAN) 0.005 % ophthalmic solution 1 drop  1 drop Both Eyes QHS Elberta Leatherwood, MD   1 drop at 01/14/16 2153  . levothyroxine (SYNTHROID, LEVOTHROID) tablet 75 mcg  75 mcg Oral QAC breakfast Elberta Leatherwood, MD   75 mcg at 01/15/16 4235  . nitroGLYCERIN (NITROSTAT) SL tablet 0.4 mg  0.4 mg Sublingual Q5 min PRN Elberta Leatherwood, MD      . pantoprazole (PROTONIX) EC tablet 40 mg  40 mg Oral Daily Elberta Leatherwood, MD   40 mg at 01/15/16 3614  . [START ON 01/16/2016] polyethylene glycol (MIRALAX / GLYCOLAX) packet 17 g  17 g Oral Daily Zenia Resides, MD      . tamsulosin (FLOMAX) capsule 0.8 mg  0.8 mg Oral QHS Elberta Leatherwood, MD   0.8 mg at 01/14/16 2152  . warfarin (COUMADIN) tablet 2 mg  2 mg Oral ONCE-1800 Carlean Jews, [facility]      . Warfarin - Pharmacist Dosing Inpatient   Does not apply [place], MD   Stopped at 01/13/16 1800    Musculoskeletal: Strength & Muscle Tone: decreased Gait & Station: unable to assess due to patient lying in the bed Patient leans: Backward  Psychiatric Specialty Exam: Physical Exam  Review of Systems  Psychiatric/Behavioral: Negative for depression, hallucinations, substance abuse and suicidal ideas. The patient is not nervous/anxious and does not have insomnia.   All other systems reviewed and are negative.   Blood pressure (!) 154/73, pulse 76, temperature 98.5 F (36.9 C), temperature source Oral, resp.  rate 18, height 5' 9"  (1.753 m), weight 171 lb (77.6 kg), SpO2 99 %.Body mass index is 25.25 kg/m.  General Appearance: Disheveled  Eye Contact:  Fair  Speech:  Slurred  Volume:  Normal  Mood:  "fine"  Affect:  Blunt  Thought Process:  Disorganized  Orientation:  Other:  oriented to place, self but not for time or situation  2017, 2014 (date), 4th (month)"  Thought Content:  no paranoia Perceptions: denies AH/VH  Suicidal Thoughts:  No  Homicidal Thoughts:  No  Memory:  Immediate;   Poor Recent;   Poor Remote;   Poor  Judgement:  Impaired  Insight:  Lacking  Psychomotor Activity:  Decreased  Concentration:  Concentration: Poor and Attention Span: Poor  Recall:  Poor  Fund of Knowledge:  Poor  Language:  Fair  Akathisia:  No  Handed:  Right  AIMS (if indicated):     Assets:  Social Support  ADL's:  Impaired  Cognition:  Impaired,  Mild  Sleep:   good   Assessment Kaison Mcparland is a 82 y.o. male patient with unspecified neurocognitive disorder, T2DM, HTN, Esophageal stricture, Dementia, CKD stage 3, BPH, CAD, PAF, Hx of DVTs( 1980s), hx of stroke, who is admitted for dysuria, treated for prerenal AKI secondary to dehydration. Psychiatry is consulted for capacity evaluation for patient wants to be discharged to home.   # Major neurocognitive disorder Today's exam is notable for his impaired memory and limited insight secondary to his dementia. At the present time, the psychiatry consultation service believes the patient not have decisional capacity to discharge to home. Criteria for decision making capacity requires patient be able to: (1) communicate a choice in a clear and consistent manner, (2) demonstrate adequate understanding of disclosed relevant information regarding her/his medical  condition and treatment, possible benefits and risks of that treatment, and alternative approaches, (3) describe views of her/his medical condition, proposed treatment and its consequences, and  (4) engage in a rational process of manipulating the relevant information to reach her/his decision. Based on our examination, the patient does not meet those criteria. A substitute decision maker must be sought to authorize medical intervention or to refuse treatment on behalf of the patient; for patients with advance directives, either the treatment choice that the patient made in advance or the choice of a surrogate decision maker may be indicated. In the absence of an advance directive and when time is available, the recommendation is usually to contact family members (the priority order to be approached is the spouse, adult children, parents, siblings, and other relatives).   In terms of his dementia, will not start psychopharmacologic treatment given its severity and no known associated behavioral and psychological symptoms of dementia.   Recommendation  - Patient does not have a capacity to be discharged to home - Check Vitamin B12, folate to rule out treatable cause of dementia - No indication for starting psychotropics  Treatment Plan Summary: Plan as above  Disposition: Patient does not meet criteria for psychiatric inpatient admission.  Thank you for your consult. We will sign off. Please contact psychiatry consult if any questions or concerns.   Norman Clay, MD 01/15/2016 2:18 PM

## 2016-01-15 NOTE — Progress Notes (Signed)
ANTICOAGULATION CONSULT NOTE  Pharmacy Consult for warfarin Indication: Hx Afib/DVT  No Known Allergies   Vital Signs: Temp: 98.5 F (36.9 C) (12/24 0515) Temp Source: Oral (12/24 0515) BP: 154/73 (12/24 0515) Pulse Rate: 76 (12/24 0515)  Labs:  Recent Labs  01/13/16 0310 01/14/16 0524 01/14/16 0722 01/15/16 0235  HGB 10.7*  --  10.9* 10.8*  HCT 31.8*  --  31.9* 31.9*  PLT 260  --  289 295  LABPROT 36.7* 30.5*  --  31.3*  INR 3.59 2.85  --  2.95  CREATININE 2.49*  --  1.54* 1.58*    Estimated Creatinine Clearance: 34.8 mL/min (by C-G formula based on SCr of 1.58 mg/dL (H)).   Assessment 82yo M on warfarin PTA for Hx Afib/DVT. INR on admission therapeutic at 2.93 and home dose continued, but rose to supratherapeutic level. Today INR remains therapeutic at 2.95. Suspect patient is more sensitive to warfarin given poor PO intake per RN and DDI with keflex started for UTI this admission. Last dose Keflex scheduled for 12/24. Note patient was on amiodarone and Synthroid PTA.  PTA warfarin regimen: 2 mg on Sundays and 4 mg all other days.   Hgb low stable, platelets wnl stable. No bleeding noted.  Goal of Therapy:  INR 2-3 Monitor platelets by anticoagulation protocol: Yes   Plan:  Warfarin 2 mg PO x 1  Daily INR, monitor CBC, s/sx bleeding, PO intake, DDI  Allena Katzaroline E Jaymi Tinner, Pharm.D. PGY1 Pharmacy Resident 12/24/20179:32 AM Pager [phone]

## 2016-01-16 DIAGNOSIS — F0390 Unspecified dementia without behavioral disturbance: Secondary | ICD-10-CM

## 2016-01-16 LAB — CBC
HCT: 33.8 % — ABNORMAL LOW (ref 39.0–52.0)
Hemoglobin: 11.4 g/dL — ABNORMAL LOW (ref 13.0–17.0)
MCH: 26.1 pg (ref 26.0–34.0)
MCHC: 33.7 g/dL (ref 30.0–36.0)
MCV: 77.5 fL — AB (ref 78.0–100.0)
PLATELETS: 264 10*3/uL (ref 150–400)
RBC: 4.36 MIL/uL (ref 4.22–5.81)
RDW: 16 % — AB (ref 11.5–15.5)
WBC: 9 10*3/uL (ref 4.0–10.5)

## 2016-01-16 LAB — GLUCOSE, CAPILLARY
GLUCOSE-CAPILLARY: 106 mg/dL — AB (ref 65–99)
Glucose-Capillary: 143 mg/dL — ABNORMAL HIGH (ref 65–99)
Glucose-Capillary: 124 mg/dL — ABNORMAL HIGH (ref 65–99)
Glucose-Capillary: 112 mg/dL — ABNORMAL HIGH (ref 65–99)

## 2016-01-16 LAB — BASIC METABOLIC PANEL
Anion gap: 8 (ref 5–15)
BUN: 17 mg/dL (ref 6–20)
CO2: 22 mmol/L (ref 22–32)
CREATININE: 1.67 mg/dL — AB (ref 0.61–1.24)
Calcium: 8.5 mg/dL — ABNORMAL LOW (ref 8.9–10.3)
Chloride: 113 mmol/L — ABNORMAL HIGH (ref 101–111)
GFR calc Af Amer: 42 mL/min — ABNORMAL LOW (ref >60)
GFR, EST NON AFRICAN AMERICAN: 36 mL/min — AB (ref >60)
GLUCOSE: 121 mg/dL — AB (ref 65–99)
POTASSIUM: 3.8 mmol/L (ref 3.5–5.1)
Sodium: 143 mmol/L (ref 135–145)

## 2016-01-16 LAB — PROTIME-INR
INR: 2.96
PROTHROMBIN TIME: 31.5 s — AB (ref 11.4–15.2)

## 2016-01-16 MED ORDER — WARFARIN SODIUM 1 MG PO TABS: 1.0000 mg | ORAL_TABLET | Freq: Once | ORAL | Status: DC

## 2016-01-16 MED ORDER — WARFARIN SODIUM 2 MG PO TABS
2.0000 mg | ORAL_TABLET | Freq: Once | ORAL | Status: AC
  Administered 2016-01-16: 2 mg via ORAL
  Filled 2016-01-16: qty 1

## 2016-01-16 NOTE — Progress Notes (Addendum)
ANTICOAGULATION CONSULT NOTE  Pharmacy Consult for warfarin Indication: Hx Afib/DVT  No Known Allergies   Vital Signs: Temp: 99.1 F (37.3 C) (12/25 0527) Temp Source: Oral (12/25 0527) BP: 190/86 (12/25 0527) Pulse Rate: 94 (12/25 0527)  Labs:  Recent Labs  01/14/16 0524  01/14/16 0722 01/15/16 0235 01/16/16 0556 01/16/16 0826  HGB  --   < > 10.9* 10.8*  --  11.4*  HCT  --   --  31.9* 31.9*  --  33.8*  PLT  --   --  289 295  --  264  LABPROT 30.5*  --   --  31.3* 31.5*  --   INR 2.85  --   --  2.95 2.96  --   CREATININE  --   --  1.54* 1.58*  --  1.67*  < > = values in this interval not displayed.  Estimated Creatinine Clearance: 32.9 mL/min (by C-G formula based on SCr of 1.67 mg/dL (H)).   Assessment 82yo M on warfarin PTA for Hx Afib/DVT. INR on admission therapeutic at 2.93 and home dose continued, but rose to supratherapeutic level. Today INR remains therapeutic at 2.96 after two doses of warfarin 2 mg. Suspect patient is more sensitive to warfarin given poor PO intake and ABX this admit. Per RN - PO intake has improved and ABX completed. . Expect patient to require doses closer to home dose soon (PTA weekly dose = 26 mg) as pt is now off ABX and PO intake continues to improve. Note patient was on amiodarone and Synthroid PTA.    PTA warfarin regimen: 2 mg on Sundays and 4 mg all other days.   Hgb low stable, platelets wnl stable. No bleeding noted.  Goal of Therapy:  INR 2-3 Monitor platelets by anticoagulation protocol: Yes   Plan:  Warfarin 2 mg PO x 1  Daily INR, monitor CBC, s/sx bleeding, PO intake, DDI  Allena Katzaroline E Patirica Longshore, Pharm.D. PGY1 Pharmacy Resident 12/25/20179:32 AM Pager [phone]

## 2016-01-16 NOTE — Progress Notes (Signed)
Family Medicine Teaching Service Daily Progress Note Intern Pager: [phone]  Patient name: [name] Medical record number: [id number] Date of birth: 1930-01-18 Age: 82 y.o. Gender: male  Primary Care Provider: Willow OraANDY,CAMILLE L, MD Consultants: Hematology Code Status: Full  Assessment and Plan: 82 y.o.malepresenting with right leg pain. PMH is significant for recent hospital admission for UTI, T2DM, HTN, Esophageal stricture, Dementia, CKD stage 3, BPH, CAD, PAF, Hx of DVTs( 1980s), hx of stroke  # Right Peroneal DVT, stable This morning INR is 2.96 and at goal (2-3). Will continue to monitor and optimize dose as needed. Both PT and OT recommend SNF or 24 hr home supervision. - Continue warfarin for DVT treatment per pharmacy - F/u on CBC, PT/INR - hematology consulted from ED: recommended continuing warfarin, would not add ASA as this would triple bleeding risk - Son in agreement with SNF placement.  Psych notes, patient does not have capacity  # Dysuria, ongoing RecentE coli UTI . Patient with good urine output -800 mL in the past 24 hr, less concerning for retention. - 5 days of Keflex completed - Strict I&O  # Acute on Chronic Kidney Disease stage 3 Baseline SCr 1.8 -2.0. On admission, creatinine 4.5. Cr 1.67 - continue to encourage fluid intake - continue holding home lasix, no evidence of fluid overload on my exam  # HFpEF/CAD, stable  Last echo with ejection fraction of 50-55% (2015). No overt sign of fluid overload on exam. Will continue current treatment plan. HR stable overnight. - ContinueCoreg 6.25 BID - Continue Imdur 60 mg daily - Continue to hold Lasix.  # T2DM. BGs in 100s - Sensitive SSI  #BPH - Continue Tamsulosin 0.8 mg, qhs  # HTN Had isolated BP to 190/86, repeat WNL.  Will continue to monitor. - Continue imdur 60 mg - Continue Coreg 6.25 BID - Continue Amiodarone 200mg  - Hydralazine 10 mg prn  FEN/GI: Carb modified Diet,  protonix Prophylaxis: [place]   Disposition: SNF  Subjective:  Patient reports he is feeling well this morning.  Reports he needs to have a BM otherwise no complaints.  Objective: Temp:  [98.2 F (36.8 C)-99.1 F (37.3 C)] 98.2 F (36.8 C) (12/25 0943) Pulse Rate:  [61-94] 74 (12/25 0943) Resp:  [18-19] 19 (12/25 0943) BP: (116-190)/(56-86) 121/61 (12/25 0943) SpO2:  [99 %-100 %] 100 % (12/25 0943)   Physical Exam: General: 82yo male resting comfortably in no apparent distress Cardiovascular: S1 and S2 noted, regular rate and rhythm Respiratory: Mild bibasilar crackles, no increased work of breathing, intermittent expiratory wheezing noted on exam. Abdomen: mild generalized tenderness, soft and nondistended, bowel sounds normal Extremities: left calf tender compared to right per patient, no palpable cord  Laboratory:  Recent Labs Lab 01/14/16 0722 01/15/16 0235 01/16/16 0826  WBC 8.8 8.5 9.0  HGB 10.9* 10.8* 11.4*  HCT 31.9* 31.9* 33.8*  PLT 289 295 264    Recent Labs Lab 01/14/16 0722 01/15/16 0235 01/16/16 0826  NA 142 144 143  K 3.4* 3.3* 3.8  CL 113* 114* 113*  CO2 21* 22 22  BUN 29* 24* 17  CREATININE 1.54* 1.58* 1.67*  CALCIUM 8.2* 8.5* 8.5*  GLUCOSE 110* 105* 121*  - INR 2.92>2.93-->3.59-->2.85 - UA: rare bacteria, hemoglobin small, leukocytes large  Raliegh Ipshly M Ihan Pat, DO 01/16/2016, 10:15 AM PGY-3, [facility] FPTS Intern pager: [phone], text pages welcome

## 2016-01-17 LAB — GLUCOSE, CAPILLARY
GLUCOSE-CAPILLARY: 120 mg/dL — AB (ref 65–99)
GLUCOSE-CAPILLARY: 130 mg/dL — AB (ref 65–99)
Glucose-Capillary: 103 mg/dL — ABNORMAL HIGH (ref 65–99)
Glucose-Capillary: 148 mg/dL — ABNORMAL HIGH (ref 65–99)

## 2016-01-17 LAB — PROTIME-INR
INR: 2.45
PROTHROMBIN TIME: 27.1 s — AB (ref 11.4–15.2)

## 2016-01-17 MED ORDER — WARFARIN SODIUM 4 MG PO TABS
4.0000 mg | ORAL_TABLET | Freq: Once | ORAL | Status: AC
  Administered 2016-01-17: 4 mg via ORAL
  Filled 2016-01-17: qty 1

## 2016-01-17 NOTE — Progress Notes (Signed)
Family Medicine Teaching Service Daily Progress Note Intern Pager: [phone]  Patient name: [name] Medical record number: [id number] Date of birth: 01/18/1930 Age: 82 y.o. Gender: male  Primary Care Provider: Willow OraANDY,CAMILLE L, MD Consultants: Hematology Code Status: Full  Assessment and Plan: 82 y.o.malepresenting with right leg pain. PMH is significant for recent hospital admission for UTI, T2DM, HTN, Esophageal stricture, Dementia, CKD stage 3, BPH, CAD, PAF, Hx of DVTs( 1980s), hx of stroke  # Right Peroneal DVT, stable This morning INR is 2.45 and at goal (2-3). Will continue to monitor and optimize dose as needed. Both PT and OT recommend SNF or 24 hr home supervision. - Continue warfarin for DVT treatment per pharmacy - F/u on CBC, PT/INR - hematology consulted from ED: recommended continuing warfarin, would not add ASA as this would triple bleeding risk - Son in agreement with SNF placement.  Psych notes, patient does not have capacity  # Acute on Chronic Kidney Disease stage 3, improved, stable. Baseline SCr 1.8 -2.0. On admission, creatinine 4.5. Cr 1.67 on 12/25 - continue to encourage fluid intake - continue holding home lasix  # Dysuria, resolved RecentE coli UTI. Patient with urine output 350 mL net +530in the past 24 hr.  - 5 days of Keflex completed - Strict I&O  # HFpEF/CAD, stable  Last echo with ejection fraction of 50-55% (2015). No overt sign of fluid overload on exam. Will continue current treatment plan. HR stable overnight. - ContinueCoreg 6.25 BID - Continue Imdur 60 mg daily - Continue to hold Lasix.  # T2DM. BGs in 100s - Sensitive SSI  #BPH - Continue Tamsulosin 0.8 mg, qhs  # HTN BP this am 153/70.  Will continue to monitor. - Continue imdur 60 mg - Continue Coreg 6.25 BID - Continue Amiodarone 200mg  - Hydralazine 10 mg prn for SBP > 180 or DBP >110  FEN/GI: Carb modified Diet, protonix Prophylaxis: Warfarin   Disposition:  SNF  Subjective:  States overall feeling well this morning. Denies dysuria today but endorses some lower abdominal fullness that he reports is due to his need to have a BM.  Objective: Temp:  [98.2 F (36.8 C)-99.1 F (37.3 C)] 99 F (37.2 C) (12/26 0524) Pulse Rate:  [54-74] 54 (12/26 0524) Resp:  [18-20] 20 (12/26 0524) BP: (91-160)/(40-77) 160/73 (12/26 0524) SpO2:  [98 %-100 %] 100 % (12/26 0524) Weight:  [74 kg (163 lb 1.6 oz)] 74 kg (163 lb 1.6 oz) (12/26 0427)   Physical Exam: General: 82yo male sitting comfortably in chair, eating breakfast. in no apparent distress Cardiovascular: S1 and S2 noted, regular rate and rhythm Respiratory: Mild bibasilar crackles, no increased work of breathing, intermittent expiratory wheezing noted on exam. Abdomen: mild generalized tenderness, soft and nondistended, bowel sounds normal Extremities: no palpable cord. 1+ pitting edema in LE b/l with skin changes c/w venous stasis  Laboratory:  Recent Labs Lab 01/14/16 0722 01/15/16 0235 01/16/16 0826  WBC 8.8 8.5 9.0  HGB 10.9* 10.8* 11.4*  HCT 31.9* 31.9* 33.8*  PLT 289 295 264    Recent Labs Lab 01/14/16 0722 01/15/16 0235 01/16/16 0826  NA 142 144 143  K 3.4* 3.3* 3.8  CL 113* 114* 113*  CO2 21* 22 22  BUN 29* 24* 17  CREATININE 1.54* 1.58* 1.67*  CALCIUM 8.2* 8.5* 8.5*  GLUCOSE 110* 105* 121*  - INR 2.92->2.93->3.59->2.85->2.95->2.96->2.45  Blake HerElsia J Hannelore Bova, DO 01/17/2016, 6:37 AM PGY-1, [facility] FPTS Intern pager: [phone], text pages welcome

## 2016-01-17 NOTE — Progress Notes (Signed)
Physical Therapy Treatment Patient Details Name: [name] MRN: [id number] DOB: 02-Nov-1929 Today's Date: 01/17/2016    History of Present Illness 82 y.o. male presenting with right leg pain. PMH is significant for recent hospital admission for UTI, T2DM, HTN, Esophageal stricture, Dementia, CKD stage 3, BPH, CAD,  PAF, Hx of DVTs( 1980s), hx of stroke.  Found to have L LE DVT and possible UTI.    PT Comments    Pt progressing slowly towards physical therapy goals. Required increased assistance this session, and NT present throughout for safety. Focus of session was [facility]. Feel that continued short term rehab at the SNF level is most appropriate d/c disposition. Will continue to follow.   Follow Up Recommendations  SNF;Supervision/Assistance - 24 hour     Equipment Recommendations  None recommended by PT    Recommendations for Other Services       Precautions / Restrictions Precautions Precautions: Fall Restrictions Weight Bearing Restrictions: No    Mobility  Bed Mobility               General bed mobility comments: Pt sitting up in recliner upon PT arrival.   Transfers Overall transfer level: Needs assistance Equipment used: Rolling walker (2 wheeled) Transfers: Sit to/from Stand Sit to Stand: Mod assist;+2 physical assistance         General transfer comment: Increased time required for all aspects. Pt was cued for hand placement on seated surface for safety, and assist was required for pt to scoot to edge of chair. Step-by-step cues required for preparation to stand, and +2 assist provided to achieve full stand to walker.   Ambulation/Gait Ambulation/Gait assistance: Min assist;+2 safety/equipment Ambulation Distance (Feet): 3 Feet Assistive device: Rolling walker (2 wheeled) Gait Pattern/deviations: Step-to pattern;Trunk flexed;Shuffle;Decreased stride length Gait velocity: Decreased Gait velocity  interpretation: Below normal speed for age/gender General Gait Details: Pt was able to take a few steps around from [facility]. Assist provided for pt to maneuver and position walker as he pivoted around to sit. VC's for all aspects of transfer, including positioning himself in front of the commode, posture, and walker placement.    Stairs            Wheelchair Mobility    Modified Rankin (Stroke Patients Only)       Balance Overall balance assessment: Needs assistance   Sitting balance-Leahy Scale: Poor Sitting balance - Comments: Pt required assist to anteriorly lean trunk off back of chair.  Postural control: Posterior lean Standing balance support: Bilateral upper extremity supported Standing balance-Leahy Scale: Poor Standing balance comment: B UE support necessary                    Cognition Arousal/Alertness: Lethargic Behavior During Therapy: Flat affect Overall Cognitive Status: No family/caregiver present to determine baseline cognitive functioning                 General Comments: h/o dementia    Exercises      General Comments        Pertinent Vitals/Pain Pain Assessment: Faces Faces Pain Scale: No hurt    Home Living                      Prior Function            PT Goals (current goals can now be found in the care plan section) Acute Rehab PT Goals Patient Stated Goal: To return  home, son not able to assist pt at home PT Goal Formulation: With patient Time For Goal Achievement: 01/20/16 Potential to Achieve Goals: Fair Progress towards PT goals: Not progressing toward goals - comment (Increased assist required today for mobility. )    Frequency    Min 2X/week      PT Plan Current plan remains appropriate;Frequency needs to be updated    Co-evaluation             End of Session Equipment Utilized During Treatment: Gait belt Activity Tolerance: Patient limited by fatigue Patient left: Other  (comment) (On BSC with NT present)     Time: [id number] PT Time Calculation (min) (ACUTE ONLY): 29 min  Charges:  $Gait Training: 8-22 mins $Therapeutic Activity: 8-22 mins                    G Codes:      Marylynn PearsonLaura D Mabrey Howland 01/17/2016, 1:14 PM  Conni SlipperLaura Orlin Kann, PT, DPT Acute Rehabilitation Services Pager: [phone]

## 2016-01-17 NOTE — Progress Notes (Signed)
ANTICOAGULATION CONSULT NOTE  Pharmacy Consult for warfarin Indication: Hx Afib/DVT  No Known Allergies   Vital Signs: Temp: 98.9 F (37.2 C) (12/26 0900) Temp Source: Oral (12/26 0900) BP: 153/70 (12/26 0900) Pulse Rate: 118 (12/26 0900)  Labs:  Recent Labs  01/15/16 0235 01/16/16 0556 01/16/16 0826 01/17/16 0542  HGB 10.8*  --  11.4*  --   HCT 31.9*  --  33.8*  --   PLT 295  --  264  --   LABPROT 31.3* 31.5*  --  27.1*  INR 2.95 2.96  --  2.45  CREATININE 1.58*  --  1.67*  --     Estimated Creatinine Clearance: 32.9 mL/min (by C-G formula based on SCr of 1.67 mg/dL (H)).   Assessment 82yo M on warfarin PTA for Hx Afib/DVT. INR on admission therapeutic at 2.93 and home dose continued, but rose to supratherapeutic level.  Today INR remains therapeutic at 2.45 after warfarin 2 mg. Suspect patient is more sensitive to warfarin given poor PO intake and ABX this admit. Expect patient to require doses closer to home dose soon (PTA weekly dose = 26 mg) as pt is now off ABX and PO intake continues to improve. Note patient was on amiodarone and Synthroid PTA.  PTA warfarin regimen: 2 mg on Sundays and 4 mg all other days.   Hgb low stable, platelets wnl stable. No bleeding noted.  Goal of Therapy:  INR 2-3 Monitor platelets by anticoagulation protocol: Yes   Plan:  Warfarin 4 mg PO x 1  Daily INR, monitor CBC, s/sx bleeding, PO intake, DDI  Sheppard CoilFrank Wilson PharmD., BCPS Clinical Pharmacist Pager [phone] 01/17/2016 10:21 AM

## 2016-01-17 NOTE — Care Management Important Message (Signed)
Important Message  Patient Details  Name: [name] MRN: [id number] Date of Birth: Jan 31, 1929   Medicare Important Message Given:  Yes    [name] 01/17/2016, 2:11 PM

## 2016-01-18 DIAGNOSIS — I824Y9 Acute embolism and thrombosis of unspecified deep veins of unspecified proximal lower extremity: Secondary | ICD-10-CM

## 2016-01-18 LAB — BASIC METABOLIC PANEL
Anion gap: 4 — ABNORMAL LOW (ref 5–15)
BUN: 14 mg/dL (ref 6–20)
CALCIUM: 8.4 mg/dL — AB (ref 8.9–10.3)
CO2: 27 mmol/L (ref 22–32)
Chloride: 113 mmol/L — ABNORMAL HIGH (ref 101–111)
Creatinine, Ser: 1.6 mg/dL — ABNORMAL HIGH (ref 0.61–1.24)
GFR calc non Af Amer: 38 mL/min — ABNORMAL LOW (ref >60)
GFR, EST AFRICAN AMERICAN: 44 mL/min — AB (ref >60)
Glucose, Bld: 102 mg/dL — ABNORMAL HIGH (ref 65–99)
Potassium: 4 mmol/L (ref 3.5–5.1)
SODIUM: 144 mmol/L (ref 135–145)

## 2016-01-18 LAB — GLUCOSE, CAPILLARY
GLUCOSE-CAPILLARY: 89 mg/dL (ref 65–99)
Glucose-Capillary: 144 mg/dL — ABNORMAL HIGH (ref 65–99)
Glucose-Capillary: 89 mg/dL (ref 65–99)

## 2016-01-18 LAB — PROTIME-INR
INR: 2.52
Prothrombin Time: 27.6 seconds — ABNORMAL HIGH (ref 11.4–15.2)

## 2016-01-18 MED ORDER — WARFARIN SODIUM 4 MG PO TABS
4.0000 mg | ORAL_TABLET | Freq: Once | ORAL | Status: DC
  Filled 2016-01-18: qty 1

## 2016-01-18 NOTE — Clinical Social Work Placement (Signed)
   CLINICAL SOCIAL WORK PLACEMENT  NOTE  Date:  01/18/2016  Patient Details  Name: [name] MRN: [id number] Date of Birth: 1929/02/24  Clinical Social Work is seeking post-discharge placement for this patient at the Skilled  Nursing Facility level of care (*CSW will initial, date and re-position this form in  chart as items are completed):  Yes   Patient/family provided with [facility] Clinical Social Work Department's list of facilities offering this level of care within the geographic area requested by the patient (or if unable, by the patient's family).  Yes   Patient/family informed of their freedom to choose among providers that offer the needed level of care, that participate in Medicare, Medicaid or managed care program needed by the patient, have an available bed and are willing to accept the patient.  Yes   Patient/family informed of '[facility] ownership interest [facility] and [facility], as well as of the fact that they are under no obligation to receive care at these facilities.  PASRR submitted to EDS on       PASRR number received on       Existing PASRR number confirmed on 01/12/16     FL2 transmitted to all facilities in geographic area requested by pt/family on 01/17/16     FL2 transmitted to all facilities within larger geographic area on       Patient informed that his/her managed care company has contracts with or will negotiate with certain facilities, including the following:        Yes   Patient/family informed of bed offers received.  [facility]     Physician recommends and patient chooses bed at      [facility]  Patient to be transferred to facility by [facility]     Patient family notified on 01/18/16 of transfer.  Name of family member notified:  Pt's son [facility]     PHYSICIAN       Additional Comment:     _______________________________________________ Dede QuerySarah Wesleigh Markovic, LCSW 01/18/2016, 3:58 PM

## 2016-01-18 NOTE — Clinical Social Work Note (Deleted)
Pt is ready for discharge today and will [facility].  Francine GravenHumana Berkley Harveyauth [phone]), starting today 01/18/2016, 3 days, RUG is RVC, 576 min of therapy a week. Next review date is 01/20/2016, clinicals to be sent to [phone].  Dede QuerySarah Eleftheria Taborn, MSW, LCSW  Clinical Social Worker  [phone]

## 2016-01-18 NOTE — Care Management Note (Signed)
Case Management Note  Patient Details  Name: [name] MRN: [id number] Date of Birth: Aug 15, 1929  Subjective/Objective:                    Action/Plan: [facility]. No further needs per CM.   Expected Discharge Date:                  Expected Discharge Plan:  Skilled Nursing Facility  In-House Referral:     Discharge planning Services     Post Acute Care Choice:    Choice offered to:     DME Arranged:    DME Agency:     HH Arranged:    HH Agency:     Status of Service:  Completed, signed off  If discussed at [employer] Length of Stay Meetings, dates discussed:    Additional Comments:  Kermit BaloKelli F Len Kluver, RN 01/18/2016, 11:04 AM

## 2016-01-18 NOTE — Clinical Social Work Note (Signed)
Pt is ready for discharge today and will go [facility]. Pt's son is aware and agreeable to discharge plan. RN will call report. [facility] will provide transportation. Facility will be able to accept pt when discharge summary is available.   Francine GravenHumana Berkley Harveyauth [phone]), starting today 01/18/2016, 3 days, RUG is RVC, 576 min of therapy a week. Next review date is 01/20/2016, clinicals to be sent to [phone].  CSW is signing off as no further needs identified.   Dede QuerySarah Amritha Yorke, MSW, LCSW  Clinical Social Worker  [phone]

## 2016-01-18 NOTE — Progress Notes (Signed)
ANTICOAGULATION CONSULT NOTE  Pharmacy Consult for Warfarin Indication: Hx Afib/DVT  Assessment 82yo M on warfarin PTA for Hx Afib/DVT. INR on admission therapeutic at 2.93 and home dose continued, but rose to supratherapeutic level. Suspected that patient was more sensitive to warfarin given poor PO intake and ABX this admit. Patient is now off ABX and PO intake continues to improve. Note patient was on amiodarone and Synthroid PTA.  PTA warfarin regimen: 2 mg on Sundays and 4 mg all other days.   INR 2.52, Hgb low stable, platelets wnl stable. No bleeding noted.  Goal of Therapy:  INR 2-3 Monitor platelets by anticoagulation protocol: Yes   Plan:  Warfarin 4 mg PO x 1  Daily INR, monitor CBC, s/sx bleeding, PO intake, DDI   No Known Allergies   Vital Signs: Temp: 98.8 F (37.1 C) (12/27 0511) Temp Source: Oral (12/27 0511) BP: (P) 167/111 (12/27 0814) Pulse Rate: (P) 85 (12/27 0814)  Labs:  Recent Labs  01/16/16 0556 01/16/16 0826 01/17/16 0542 01/18/16 0530  HGB  --  11.4*  --   --   HCT  --  33.8*  --   --   PLT  --  264  --   --   LABPROT 31.5*  --  27.1* 27.6*  INR 2.96  --  2.45 2.52  CREATININE  --  1.67*  --  1.60*    Estimated Creatinine Clearance: 34.4 mL/min (by C-G formula based on SCr of 1.6 mg/dL (H)).   Allie BossierApryl Anderson, PharmD PGY1 Pharmacy Resident [phone] (Pager) 01/18/2016 8:40 AM

## 2016-03-13 ENCOUNTER — Emergency Department (HOSPITAL_COMMUNITY)
Admission: EM | Admit: 2016-03-13 | Discharge: 2016-03-22 | Disposition: E | Payer: Medicare HMO | Attending: Physician Assistant | Admitting: Physician Assistant

## 2016-03-13 ENCOUNTER — Emergency Department (HOSPITAL_COMMUNITY): Payer: Medicare HMO

## 2016-03-13 ENCOUNTER — Encounter (HOSPITAL_COMMUNITY): Payer: Self-pay | Admitting: *Deleted

## 2016-03-13 DIAGNOSIS — Z8673 Personal history of transient ischemic attack (TIA), and cerebral infarction without residual deficits: Secondary | ICD-10-CM | POA: Insufficient documentation

## 2016-03-13 DIAGNOSIS — E1122 Type 2 diabetes mellitus with diabetic chronic kidney disease: Secondary | ICD-10-CM | POA: Diagnosis not present

## 2016-03-13 DIAGNOSIS — Z955 Presence of coronary angioplasty implant and graft: Secondary | ICD-10-CM | POA: Insufficient documentation

## 2016-03-13 DIAGNOSIS — Z87891 Personal history of nicotine dependence: Secondary | ICD-10-CM | POA: Insufficient documentation

## 2016-03-13 DIAGNOSIS — J45909 Unspecified asthma, uncomplicated: Secondary | ICD-10-CM | POA: Diagnosis not present

## 2016-03-13 DIAGNOSIS — I129 Hypertensive chronic kidney disease with stage 1 through stage 4 chronic kidney disease, or unspecified chronic kidney disease: Secondary | ICD-10-CM | POA: Diagnosis not present

## 2016-03-13 DIAGNOSIS — I469 Cardiac arrest, cause unspecified: Secondary | ICD-10-CM | POA: Diagnosis not present

## 2016-03-13 DIAGNOSIS — E039 Hypothyroidism, unspecified: Secondary | ICD-10-CM | POA: Insufficient documentation

## 2016-03-13 DIAGNOSIS — I252 Old myocardial infarction: Secondary | ICD-10-CM | POA: Diagnosis not present

## 2016-03-13 DIAGNOSIS — R092 Respiratory arrest: Secondary | ICD-10-CM | POA: Diagnosis present

## 2016-03-13 DIAGNOSIS — Z7901 Long term (current) use of anticoagulants: Secondary | ICD-10-CM | POA: Diagnosis not present

## 2016-03-13 DIAGNOSIS — N183 Chronic kidney disease, stage 3 (moderate): Secondary | ICD-10-CM | POA: Diagnosis not present

## 2016-03-13 DIAGNOSIS — I2510 Atherosclerotic heart disease of native coronary artery without angina pectoris: Secondary | ICD-10-CM | POA: Diagnosis not present

## 2016-03-13 MED ORDER — NALOXONE HCL 2 MG/2ML IJ SOSY
1.0000 mg | PREFILLED_SYRINGE | Freq: Once | INTRAMUSCULAR | Status: DC
Start: 2016-03-13 — End: 2016-03-13

## 2016-03-13 MED ORDER — CALCIUM CHLORIDE 10 % IV SOLN
INTRAVENOUS | Status: AC | PRN
  Administered 2016-03-13: 1 g via INTRAVENOUS

## 2016-03-13 MED ORDER — ATROPINE SULFATE 1 MG/ML IJ SOLN
INTRAMUSCULAR | Status: AC | PRN
  Administered 2016-03-13 (×2): .5 mg via INTRAVENOUS

## 2016-03-13 MED ORDER — EPINEPHRINE PF 1 MG/10ML IJ SOSY
PREFILLED_SYRINGE | INTRAMUSCULAR | Status: AC | PRN
  Administered 2016-03-13 (×6): 1 mg via INTRAVENOUS

## 2016-03-13 MED ORDER — SODIUM BICARBONATE 8.4 % IV SOLN
INTRAVENOUS | Status: AC | PRN
  Administered 2016-03-13: 50 meq via INTRAVENOUS

## 2016-03-13 MED FILL — Medication: Qty: 1 | Status: AC

## 2016-03-22 NOTE — Code Documentation (Signed)
Family remains at the bedside.

## 2016-03-22 NOTE — ED Notes (Signed)
EPI drip was started at 1600 EPI is going at 0.311mcg  (0.674ml/h) Family remains at the bedside, supported by Chaplain and able to have some time with patient.  They have discussed with the EDP that the outcome will not be what they wish for their brother and that he will not be coded again if he should loose his pulse.  Pt remains on the ventilator.  Called nurse first to see if other family members have come

## 2016-03-22 NOTE — ED Provider Notes (Signed)
[facility]ENCY DEPT Provider Note   CSN: [id number] Arrival date & time: 03/27/16  1523     History   Chief Complaint Chief Complaint  Patient presents with  . Respiratory Arrest    HPI [name] is a 83 y.o. male. Pt presents with respiratory arrest. He lives at [facility] where he was found unresponsive. Last seen normal 1 hour prior. Upon EMS arrival, pt had pulses but not breathing. He was intubated and has remained with normal pulse. He has had no spontaneous respirations and no purposeful movement. No other history available at this time.  HPI  Past Medical History:  Diagnosis Date  . Anginal pain (HCC)   . Anxiety   . Arthritis    "mostly in my knees and shoulders"  . Asthma    "touch"  . Chronic abdominal pain   . Chronic back pain   . Collapsed lung    "for 14 years" right   . Complication of anesthesia   . Coronary artery disease   . Depression   . Diabetes mellitus   . DVT, lower extremity (HCC) 1980's   LLE  . Esophageal stricture   . GERD (gastroesophageal reflux disease)   . Glaucoma   . Gout    no problems in 2 years  . Hearing loss   . Hemorrhoids   . Hiatal hernia   . Hyperlipemia   . Hypertension   . Hypothyroidism   . Myocardial infarction 1987; 1989  . Paroxysmal atrial fibrillation (HCC)   . Pneumonia 1970's  . PONV (postoperative nausea and vomiting)   . Pulmonary embolism, bilateral (HCC) 2010  . Renal disorder ~ 03/2011   "wasn't functioning right"  . Schatzki's ring   . Stroke [facility])    "just a little one"    Patient Active Problem List   Diagnosis Date Noted  . Major neurocognitive disorder 01/15/2016  . Muscle weakness (generalized)   . Left shoulder pain   . Acute deep vein thrombosis (DVT) of right lower extremity (HCC)   . AKI (acute kidney injury) (HCC) 01/11/2016  . DVT (deep venous thrombosis) (HCC) 01/11/2016  . Dehydration   . Acute cystitis without hematuria   . Pain in both lower extremities   .  Acute renal failure superimposed on chronic kidney disease (HCC)   . UTI (urinary tract infection) 12/10/2015  . Chronic RLQ pain 09/10/2014  . Chest pain 01/11/2014  . Chest pain at rest 01/08/2014  . Unstable angina (HCC) 07/15/2013  . Abdominal pain, unspecified site 06/02/2012  . Schatzki's ring - nonobstructive 05/31/2012  . Dementia, senile 05/20/2012  . Constipation, chronic 01/28/2012  . Esophageal stricture 01/28/2012  . Paraesophageal sliding hiatal hernia 01/28/2012  . Anticoagulated on warfarin 01/28/2012  . DJD (degenerative joint disease) of knee 09/19/2011  . GERD (gastroesophageal reflux disease) 09/19/2011  . CKD (chronic kidney disease) stage 3, GFR 30-59 ml/min 08/08/2011  . BPH (benign prostatic hyperplasia) 08/08/2011  . Type II or unspecified type diabetes mellitus with renal manifestations, uncontrolled(250.42) 03/21/2006  . HYPERTENSION, BENIGN SYSTEMIC 03/21/2006  . CLAUDICATION, INTERMITTENT 03/21/2006  . RHINITIS, ALLERGIC 03/21/2006    Past Surgical History:  Procedure Laterality Date  . APPENDECTOMY  1950  . CARDIAC CATHETERIZATION    . CATARACT EXTRACTION W/ INTRAOCULAR LENS  IMPLANT, BILATERAL Bilateral 1990's  . COLONOSCOPY N/A 05/01/2012   Procedure: COLONOSCOPY;  Surgeon: Theda Belfast, MD;  Location: [facility] ENDOSCOPY;  Service: Endoscopy;  Laterality: N/A;  . CORONARY ANGIOPLASTY WITH STENT  PLACEMENT     "think I have 4 total"  . ESOPHAGOGASTRODUODENOSCOPY  12/22/2010   Procedure: ESOPHAGOGASTRODUODENOSCOPY (EGD);  Surgeon: Theda BelfastPatrick D Hung;  Location: [facility] ENDOSCOPY;  Service: Endoscopy;  Laterality: N/A;  . ESOPHAGOGASTRODUODENOSCOPY N/A 04/30/2012   Procedure: ESOPHAGOGASTRODUODENOSCOPY (EGD);  Surgeon: Theda BelfastPatrick D Hung, MD;  [facility];  Service: Endoscopy;  Laterality: N/A;  . HERNIA REPAIR    . INGUINAL HERNIA REPAIR Bilateral 03/28/2012   Procedure: LAPAROSCOPIC BILATERAL INGUINAL HERNIA REPAIR;  Surgeon: Ardeth SportsmanSteven C. Gross, MD;  Location: [facility]  ORS;  Service: General;  Laterality: Bilateral;  . INSERTION OF MESH Bilateral 03/28/2012   Procedure: INSERTION OF MESH;  Surgeon: Ardeth SportsmanSteven C. Gross, MD;  Location: [facility] ORS;  Service: General;  Laterality: Bilateral;  . LEFT HEART CATHETERIZATION WITH CORONARY ANGIOGRAM N/A 02/19/2011   Procedure: LEFT HEART CATHETERIZATION WITH CORONARY ANGIOGRAM;  Surgeon: Robynn PaneMohan N Harwani, MD;  [facility];  Service: Cardiovascular;  Laterality: N/A;       Home Medications    Prior to Admission medications   Medication Sig Start Date End Date Taking? Authorizing Provider  acetaminophen (TYLENOL) 500 MG tablet Take 500-1,000 mg by mouth every 6 (six) hours as needed for headache (pain).    Historical Provider, MD  amiodarone (PACERONE) 200 MG tablet Take 200 mg by mouth daily.     Historical Provider, MD  carvedilol (COREG) 3.125 MG tablet Take 2 tablets (6.25 mg total) by mouth 2 (two) times daily with a meal. 8am, 6pm 12/13/15   Tillman SersAngela C Riccio, DO  divalproex (DEPAKOTE) 125 MG DR tablet Take 125 mg by mouth 2 (two) times daily.    Historical Provider, MD  isosorbide mononitrate (IMDUR) 60 MG 24 hr tablet Take 60 mg by mouth daily.    Historical Provider, MD  latanoprost (XALATAN) 0.005 % ophthalmic solution Place 1 drop into both eyes at bedtime.    Historical Provider, MD  levothyroxine (SYNTHROID, LEVOTHROID) 75 MCG tablet Take 75 mcg by mouth daily before breakfast.    Historical Provider, MD  nitroGLYCERIN (NITROSTAT) 0.4 MG SL tablet Place 1 tablet (0.4 mg total) under the tongue every 5 (five) minutes as needed for chest pain. 04/24/14   Garlon HatchetLisa M Sanders, PA-C  omeprazole (PRILOSEC) 20 MG capsule Take 20 mg by mouth daily.    Historical Provider, MD  ondansetron (ZOFRAN ODT) 4 MG disintegrating tablet 4mg  ODT q4 hours prn nausea/vomit 03/27/14   Purvis SheffieldForrest Harrison, MD  tamsulosin (FLOMAX) 0.4 MG CAPS capsule Take 0.8 mg by mouth at bedtime.     Historical Provider, MD  warfarin (COUMADIN) 4 MG tablet  Take 2-4 mg by mouth daily. Take 4 mg Monday-Saturday take 2 mg on Sunday    Historical Provider, MD    Family History Family History  Problem Relation Age of Onset  . Diabetes Mother   . Heart disease Father   . Stroke Father   . Lung cancer Brother   . Hypertension Neg Hx   . Hyperlipidemia Neg Hx   . Alcohol abuse Neg Hx   . Colon cancer Neg Hx     Social History Social History  Substance Use Topics  . Smoking status: Former Smoker    Packs/day: 2.00    Years: 3.00    Types: Cigarettes    Start date: 01/27/1946    Quit date: 01/22/1981  . Smokeless tobacco: Never Used  . Alcohol use No     Comment: 07/12/2011; "last drink of alcohol was ~ 1983"  Allergies   Patient has no known allergies.   Review of Systems Review of Systems  Unable to perform ROS: Intubated     Physical Exam Updated Vital Signs BP (!) 56/29   Pulse (!) 129   Resp 23   Wt 68 kg   SpO2 (!) 78%   BMI 22.15 kg/m   Physical Exam  Constitutional: He appears well-developed. He appears distressed.  Chronically ill-appearing. Currently intubated and being mechanically ventilated  HENT:  Head: Normocephalic and atraumatic.  Eyes:  Pupils 1mm bilaterally and minimally reactive  Neck: No tracheal deviation present.  Cardiovascular: Normal rate and regular rhythm.   Pulmonary/Chest:  Diminished left-sided breath sounds. Being mechanically ventilated  Abdominal: Soft. He exhibits no distension and no mass.  Genitourinary:  Genitourinary Comments: Diaper in place  Musculoskeletal: He exhibits no edema or deformity.  Neurological:  Pt is unresponsive. GCS is 3  Skin: Skin is warm and dry.  Psychiatric:  Intubated and unresponsive     ED Treatments / Results  Labs (all labs ordered are listed, but only abnormal results are displayed) Labs Reviewed  URINE CULTURE    EKG  EKG Interpretation None       Radiology No results found.  Procedures Procedures (including critical  care time)  Medications Ordered in ED Medications  EPINEPHrine (ADRENALIN) 1 MG/10ML injection (1 mg Intravenous Given Mar 22, 2016 1551)  atropine injection (0.5 mg Intravenous Given 2016-03-22 1553)  calcium chloride injection (1 g Intravenous Given 03-22-2016 1537)  sodium bicarbonate injection (50 mEq Intravenous Given Mar 22, 2016 1545)     Initial Impression / Assessment and Plan / ED Course  I have reviewed the triage vital signs and the nursing notes.  Pertinent labs & imaging results that were available during my care of the patient were reviewed by me and considered in my medical decision making (see chart for details).    Pt with significant hx as above p/w respiratory arrest. Pt was found unresponsive at his SNF, last seen normal 1 hour prior. Upon arrival, pt with pulse and HR in normal range. Being bagged via ETT placed in field. Pt transferred to ED bed. Diminished left side lung sounds. ET tube at 30cm depth. Tube was retracted to depth of 24cm and pt then had equal lung sounds. EKG showed wide complex bradycardia with significant ST depression in lateral leads. Atropine given for his bradycardia. Pt lost pulses within a few minutes of arrival. CPR initiated. Initial rhythm PEA. See code documentation for full details of meds. Multiple rounds of epinephrine given. Calcium chloride and bicarb given as well d/t pt's hx of renal insufficiency. The patient's brother was brought into room during code. After multiple rounds of CPR with PEA, pt then had VT on monitor and was defibrillated x2. Decision was made to stop resuscitate at next pulse check if no pulse. At next pulse check pt had femoral pulse. Epinephrine drip was initiated. In the meantime cardiology had been called regarding EKG findings and states pt is not a candidate for cath lab due to prolonged down time. Extensive family discussion took place and the decision was made to not resume CPR should pt lose pulses again. Shortly thereafter, pt  lost pulse and was pronounced dead. PCP contacted and will sign death certificate. Family and chaplain present.  Final Clinical Impressions(s) / ED Diagnoses   Final diagnoses:  Cardiac arrest [facility])    New Prescriptions Discharge Medication List as of Mar 22, 2016  6:37 PM  Lennette Bihari, MD 03/14/16 1039    Courteney Randall An, MD 03/15/16 1250

## 2016-03-22 NOTE — Code Documentation (Signed)
Restarted CPR at this time, family remains in the room

## 2016-03-22 NOTE — Code Documentation (Signed)
Pulse check,  No pulse.  Wide complex rhythm, defib pt.  CPR continues

## 2016-03-22 NOTE — Code Documentation (Signed)
[facility]

## 2016-03-22 NOTE — Code Documentation (Signed)
CPR continues.  IO is in place.  Unable to obtain additional IV access

## 2016-03-22 NOTE — Code Documentation (Signed)
Pulse check was PEA 

## 2016-03-22 NOTE — ED Triage Notes (Signed)
[facility] normal one hour ago.  He was unresponsive, this occurred after he ate per staff.  Pt is intubated.

## 2016-03-22 NOTE — Code Documentation (Signed)
Pulse check.  No pulse, PEA

## 2016-03-22 NOTE — Code Documentation (Signed)
Family updated as to patient's status. They are at the bedside while every effort is being made.  EDP has explained to family

## 2016-03-22 NOTE — Code Documentation (Signed)
Pulse check.  Pt has brady pulse

## 2016-03-22 NOTE — Progress Notes (Signed)
Responded to CPR in progress to support staff and family at bedside. Pt rep[orted to ED from nursing home as CPR. Patient deceased. Accompanied EDP to talk with patient's son and other sibling and friends. Facilitated information sharing between staff and family. Remained with family until their departure.    01/09/2017 1600  Clinical Encounter Type  Visited With Patient and family together;Health care provider  Visit Type Initial;Spiritual support;Death;ED;Patient actively dying  Referral From Nurse  Spiritual Encounters  Spiritual Needs Prayer;Emotional;Grief support  Stress Factors  Family Stress Factors Exhausted;Loss  Hayes LudwigWatlington, Sani Loiseau, Chaplain Rael Tilly, VirginiaChaplain,pager [phone]

## 2016-03-22 NOTE — Code Documentation (Signed)
Family updated as to patient's status.

## 2016-03-22 NOTE — ED Notes (Addendum)
Pt lost his pulses again.  EDP to the bedside.  Time of death.  1615. Moment of silence observed with EDP, staff, chaplain and family.

## 2016-03-22 NOTE — Code Documentation (Signed)
Pulse check, no pulse.  200J Defib.  CPR continued

## 2016-03-22 NOTE — Code Documentation (Signed)
Pulse check: no pulse 

## 2016-03-22 NOTE — Code Documentation (Signed)
Pt was defibrilated, CPR continues

## 2016-03-22 NOTE — ED Notes (Signed)
[facility] Notified of pt death

## 2016-03-22 NOTE — Code Documentation (Signed)
Pulse check.  Faint pulse at this time.  Stopped CPR.  Family is in the room.  EPI drip being prepared per EDP

## 2016-03-22 DEATH — deceased
